# Patient Record
Sex: Female | Born: 1935 | Race: White | Hispanic: No | Smoking: Never smoker
Health system: Southern US, Community
[De-identification: ages and names within clinical notes are randomized; demographics above are authoritative.]

## PROBLEM LIST (undated history)

## (undated) DIAGNOSIS — I868 Varicose veins of other specified sites: Secondary | ICD-10-CM

## (undated) DIAGNOSIS — I252 Old myocardial infarction: Secondary | ICD-10-CM

## (undated) DIAGNOSIS — Z8542 Personal history of malignant neoplasm of other parts of uterus: Secondary | ICD-10-CM

## (undated) DIAGNOSIS — I4891 Unspecified atrial fibrillation: Secondary | ICD-10-CM

## (undated) DIAGNOSIS — Z86010 Personal history of colonic polyps: Secondary | ICD-10-CM

## (undated) DIAGNOSIS — R42 Dizziness and giddiness: Secondary | ICD-10-CM

## (undated) DIAGNOSIS — I428 Other cardiomyopathies: Secondary | ICD-10-CM

## (undated) DIAGNOSIS — I10 Essential (primary) hypertension: Secondary | ICD-10-CM

## (undated) DIAGNOSIS — Z950 Presence of cardiac pacemaker: Secondary | ICD-10-CM

## (undated) DIAGNOSIS — C439 Malignant melanoma of skin, unspecified: Secondary | ICD-10-CM

## (undated) DIAGNOSIS — E785 Hyperlipidemia, unspecified: Secondary | ICD-10-CM

## (undated) DIAGNOSIS — R55 Syncope and collapse: Secondary | ICD-10-CM

## (undated) DIAGNOSIS — J449 Chronic obstructive pulmonary disease, unspecified: Secondary | ICD-10-CM

## (undated) DIAGNOSIS — I2510 Atherosclerotic heart disease of native coronary artery without angina pectoris: Secondary | ICD-10-CM

## (undated) DIAGNOSIS — N2889 Other specified disorders of kidney and ureter: Secondary | ICD-10-CM

## (undated) DIAGNOSIS — I447 Left bundle-branch block, unspecified: Secondary | ICD-10-CM

## (undated) HISTORY — DX: Essential (primary) hypertension: I10

## (undated) HISTORY — DX: Personal history of malignant neoplasm of other parts of uterus: Z85.42

## (undated) HISTORY — DX: Unspecified atrial fibrillation: I48.91

## (undated) HISTORY — DX: Hyperlipidemia, unspecified: E78.5

## (undated) HISTORY — DX: Personal history of colonic polyps: Z86.010

## (undated) HISTORY — DX: Other cardiomyopathies: I42.8

## (undated) HISTORY — DX: Left bundle-branch block, unspecified: I44.7

## (undated) HISTORY — DX: Malignant melanoma of skin, unspecified: C43.9

## (undated) HISTORY — DX: Syncope and collapse: R55

## (undated) HISTORY — DX: Dizziness and giddiness: R42

## (undated) HISTORY — DX: Atherosclerotic heart disease of native coronary artery without angina pectoris: I25.10

## (undated) HISTORY — PX: OOPHORECTOMY: SHX86

## (undated) HISTORY — DX: Old myocardial infarction: I25.2

## (undated) HISTORY — DX: Varicose veins of other specified sites: I86.8

## (undated) HISTORY — PX: OTHER SURGICAL HISTORY: SHX169

---

## 1986-04-12 HISTORY — PX: ABDOMINAL HYSTERECTOMY: SHX81

## 1998-04-12 LAB — CONVERTED CEMR LAB

## 2002-04-12 HISTORY — PX: CORONARY ARTERY BYPASS GRAFT: SHX141

## 2002-10-26 ENCOUNTER — Encounter: Payer: Self-pay | Admitting: Internal Medicine

## 2003-07-01 ENCOUNTER — Encounter: Payer: Self-pay | Admitting: Internal Medicine

## 2004-09-03 ENCOUNTER — Encounter: Payer: Self-pay | Admitting: Internal Medicine

## 2007-03-01 ENCOUNTER — Encounter: Payer: Self-pay | Admitting: Internal Medicine

## 2007-04-13 HISTORY — PX: CORONARY ANGIOPLASTY WITH STENT PLACEMENT: SHX49

## 2007-06-15 ENCOUNTER — Encounter: Payer: Self-pay | Admitting: Internal Medicine

## 2007-06-26 ENCOUNTER — Encounter: Payer: Self-pay | Admitting: Internal Medicine

## 2007-09-22 ENCOUNTER — Emergency Department (HOSPITAL_COMMUNITY): Admission: EM | Admit: 2007-09-22 | Discharge: 2007-09-22 | Payer: Self-pay | Admitting: Emergency Medicine

## 2007-09-22 ENCOUNTER — Encounter: Payer: Self-pay | Admitting: Internal Medicine

## 2007-10-06 ENCOUNTER — Ambulatory Visit: Payer: Self-pay | Admitting: Internal Medicine

## 2007-10-06 DIAGNOSIS — I5020 Unspecified systolic (congestive) heart failure: Secondary | ICD-10-CM | POA: Insufficient documentation

## 2007-10-06 DIAGNOSIS — E785 Hyperlipidemia, unspecified: Secondary | ICD-10-CM

## 2007-10-06 DIAGNOSIS — I2510 Atherosclerotic heart disease of native coronary artery without angina pectoris: Secondary | ICD-10-CM

## 2007-10-06 DIAGNOSIS — I255 Ischemic cardiomyopathy: Secondary | ICD-10-CM

## 2007-10-06 DIAGNOSIS — I10 Essential (primary) hypertension: Secondary | ICD-10-CM

## 2007-10-06 DIAGNOSIS — I252 Old myocardial infarction: Secondary | ICD-10-CM

## 2007-10-06 DIAGNOSIS — I4891 Unspecified atrial fibrillation: Secondary | ICD-10-CM

## 2007-10-06 HISTORY — DX: Essential (primary) hypertension: I10

## 2007-10-06 HISTORY — DX: Old myocardial infarction: I25.2

## 2007-10-06 HISTORY — DX: Atherosclerotic heart disease of native coronary artery without angina pectoris: I25.10

## 2007-10-06 HISTORY — DX: Hyperlipidemia, unspecified: E78.5

## 2007-10-06 HISTORY — DX: Unspecified atrial fibrillation: I48.91

## 2007-10-11 ENCOUNTER — Encounter: Admission: RE | Admit: 2007-10-11 | Discharge: 2007-10-11 | Payer: Self-pay | Admitting: Internal Medicine

## 2007-10-18 ENCOUNTER — Encounter: Payer: Self-pay | Admitting: Pharmacist

## 2007-10-18 ENCOUNTER — Ambulatory Visit: Payer: Self-pay | Admitting: Internal Medicine

## 2007-10-18 LAB — CONVERTED CEMR LAB: INR: 1.4

## 2007-10-26 ENCOUNTER — Encounter: Payer: Self-pay | Admitting: Pharmacist

## 2007-10-26 LAB — CONVERTED CEMR LAB: INR: 2.2

## 2007-11-02 ENCOUNTER — Ambulatory Visit: Payer: Self-pay | Admitting: Cardiology

## 2007-11-21 ENCOUNTER — Encounter: Payer: Self-pay | Admitting: Pharmacist

## 2007-11-21 LAB — CONVERTED CEMR LAB: INR: 3

## 2007-11-28 ENCOUNTER — Ambulatory Visit: Payer: Self-pay | Admitting: Cardiology

## 2007-11-30 ENCOUNTER — Ambulatory Visit: Payer: Self-pay | Admitting: Cardiology

## 2007-11-30 LAB — CONVERTED CEMR LAB
Basophils Absolute: 0 10*3/uL (ref 0.0–0.1)
Basophils Relative: 0.9 % (ref 0.0–3.0)
CO2: 31 meq/L (ref 19–32)
Calcium: 8.8 mg/dL (ref 8.4–10.5)
Chloride: 105 meq/L (ref 96–112)
Eosinophils Absolute: 0.1 10*3/uL (ref 0.0–0.7)
Eosinophils Relative: 2.4 % (ref 0.0–5.0)
GFR calc non Af Amer: 75 mL/min
Hemoglobin: 13.4 g/dL (ref 12.0–15.0)
Lymphocytes Relative: 26.7 % (ref 12.0–46.0)
Monocytes Absolute: 0.4 10*3/uL (ref 0.1–1.0)
Neutrophils Relative %: 60.8 % (ref 43.0–77.0)
Potassium: 3.6 meq/L (ref 3.5–5.1)
Prothrombin Time: 27.4 s — ABNORMAL HIGH (ref 10.9–13.3)
RDW: 13.1 % (ref 11.5–14.6)
Sodium: 141 meq/L (ref 135–145)
WBC: 4.5 10*3/uL (ref 4.5–10.5)

## 2007-12-01 ENCOUNTER — Inpatient Hospital Stay (HOSPITAL_BASED_OUTPATIENT_CLINIC_OR_DEPARTMENT_OTHER): Admission: RE | Admit: 2007-12-01 | Discharge: 2007-12-01 | Payer: Self-pay | Admitting: Cardiology

## 2007-12-01 ENCOUNTER — Ambulatory Visit: Payer: Self-pay | Admitting: Cardiology

## 2007-12-01 ENCOUNTER — Ambulatory Visit: Payer: Self-pay | Admitting: Internal Medicine

## 2007-12-05 ENCOUNTER — Ambulatory Visit: Payer: Self-pay

## 2007-12-05 ENCOUNTER — Encounter: Payer: Self-pay | Admitting: Internal Medicine

## 2007-12-12 ENCOUNTER — Ambulatory Visit: Payer: Self-pay | Admitting: Cardiology

## 2007-12-12 LAB — CONVERTED CEMR LAB
BUN: 14 mg/dL (ref 6–23)
Basophils Relative: 1.1 % (ref 0.0–3.0)
Chloride: 108 meq/L (ref 96–112)
Eosinophils Absolute: 0.2 10*3/uL (ref 0.0–0.7)
Eosinophils Relative: 4.2 % (ref 0.0–5.0)
GFR calc Af Amer: 90 mL/min
Glucose, Bld: 80 mg/dL (ref 70–99)
HCT: 39.4 % (ref 36.0–46.0)
INR: 1.2 — ABNORMAL HIGH (ref 0.8–1.0)
MCHC: 34.1 g/dL (ref 30.0–36.0)
MCV: 93.3 fL (ref 78.0–100.0)
Monocytes Absolute: 0.4 10*3/uL (ref 0.1–1.0)
Prothrombin Time: 14.3 s — ABNORMAL HIGH (ref 10.9–13.3)
Sodium: 141 meq/L (ref 135–145)

## 2007-12-14 ENCOUNTER — Inpatient Hospital Stay (HOSPITAL_COMMUNITY): Admission: AD | Admit: 2007-12-14 | Discharge: 2007-12-15 | Payer: Self-pay | Admitting: Cardiology

## 2007-12-14 ENCOUNTER — Ambulatory Visit: Payer: Self-pay | Admitting: Cardiology

## 2007-12-19 ENCOUNTER — Encounter (INDEPENDENT_AMBULATORY_CARE_PROVIDER_SITE_OTHER): Payer: Self-pay | Admitting: Pharmacist

## 2007-12-28 ENCOUNTER — Encounter (HOSPITAL_COMMUNITY): Admission: RE | Admit: 2007-12-28 | Discharge: 2008-03-27 | Payer: Self-pay | Admitting: Cardiology

## 2007-12-28 ENCOUNTER — Ambulatory Visit: Payer: Self-pay | Admitting: Cardiology

## 2008-03-28 ENCOUNTER — Encounter (HOSPITAL_COMMUNITY): Admission: RE | Admit: 2008-03-28 | Discharge: 2008-04-10 | Payer: Self-pay | Admitting: Cardiology

## 2008-04-08 ENCOUNTER — Telehealth: Payer: Self-pay | Admitting: Internal Medicine

## 2008-04-29 ENCOUNTER — Ambulatory Visit: Payer: Self-pay | Admitting: Internal Medicine

## 2008-04-30 ENCOUNTER — Ambulatory Visit: Payer: Self-pay | Admitting: Cardiology

## 2008-04-30 LAB — CONVERTED CEMR LAB
Alkaline Phosphatase: 61 units/L (ref 39–117)
BUN: 13 mg/dL (ref 6–23)
Basophils Absolute: 0 10*3/uL (ref 0.0–0.1)
Basophils Relative: 0.4 % (ref 0.0–3.0)
CO2: 33 meq/L — ABNORMAL HIGH (ref 19–32)
Calcium: 9.5 mg/dL (ref 8.4–10.5)
Chloride: 102 meq/L (ref 96–112)
Creatinine, Ser: 0.8 mg/dL (ref 0.4–1.2)
Eosinophils Absolute: 0.2 10*3/uL (ref 0.0–0.7)
Eosinophils Relative: 3.3 % (ref 0.0–5.0)
GFR calc Af Amer: 90 mL/min
GFR calc non Af Amer: 75 mL/min
Glucose, Bld: 91 mg/dL (ref 70–99)
HCT: 40.6 % (ref 36.0–46.0)
Hemoglobin: 14.2 g/dL (ref 12.0–15.0)
Lymphocytes Relative: 24.9 % (ref 12.0–46.0)
Monocytes Absolute: 0.5 10*3/uL (ref 0.1–1.0)
Monocytes Relative: 8.7 % (ref 3.0–12.0)
Neutro Abs: 3.2 10*3/uL (ref 1.4–7.7)
RDW: 13.5 % (ref 11.5–14.6)
Sodium: 141 meq/L (ref 135–145)
TSH: 1.96 microintl units/mL (ref 0.35–5.50)
Total Bilirubin: 1 mg/dL (ref 0.3–1.2)
Total CHOL/HDL Ratio: 2.8
VLDL: 21 mg/dL (ref 0–40)
WBC: 5.2 10*3/uL (ref 4.5–10.5)

## 2008-08-28 ENCOUNTER — Encounter (INDEPENDENT_AMBULATORY_CARE_PROVIDER_SITE_OTHER): Payer: Self-pay | Admitting: *Deleted

## 2008-08-30 ENCOUNTER — Inpatient Hospital Stay (HOSPITAL_COMMUNITY): Admission: EM | Admit: 2008-08-30 | Discharge: 2008-08-31 | Payer: Self-pay | Admitting: Emergency Medicine

## 2008-08-30 ENCOUNTER — Ambulatory Visit: Payer: Self-pay | Admitting: Cardiovascular Disease

## 2008-08-30 HISTORY — PX: CARDIAC CATHETERIZATION: SHX172

## 2008-09-06 ENCOUNTER — Ambulatory Visit: Payer: Self-pay | Admitting: Internal Medicine

## 2008-09-06 DIAGNOSIS — Z8542 Personal history of malignant neoplasm of other parts of uterus: Secondary | ICD-10-CM

## 2008-09-06 DIAGNOSIS — Z85820 Personal history of malignant melanoma of skin: Secondary | ICD-10-CM

## 2008-09-06 DIAGNOSIS — I447 Left bundle-branch block, unspecified: Secondary | ICD-10-CM

## 2008-09-06 DIAGNOSIS — C439 Malignant melanoma of skin, unspecified: Secondary | ICD-10-CM

## 2008-09-06 HISTORY — DX: Left bundle-branch block, unspecified: I44.7

## 2008-09-06 HISTORY — DX: Personal history of malignant neoplasm of other parts of uterus: Z85.42

## 2008-09-06 HISTORY — DX: Malignant melanoma of skin, unspecified: C43.9

## 2008-09-18 ENCOUNTER — Ambulatory Visit: Payer: Self-pay

## 2008-09-18 ENCOUNTER — Encounter: Payer: Self-pay | Admitting: Cardiology

## 2008-09-18 ENCOUNTER — Ambulatory Visit: Payer: Self-pay | Admitting: Internal Medicine

## 2008-10-22 ENCOUNTER — Ambulatory Visit: Payer: Self-pay | Admitting: Internal Medicine

## 2008-11-13 ENCOUNTER — Telehealth: Payer: Self-pay | Admitting: Cardiology

## 2008-11-21 ENCOUNTER — Encounter (INDEPENDENT_AMBULATORY_CARE_PROVIDER_SITE_OTHER): Payer: Self-pay | Admitting: *Deleted

## 2008-12-09 ENCOUNTER — Telehealth: Payer: Self-pay | Admitting: Cardiology

## 2008-12-17 ENCOUNTER — Telehealth: Payer: Self-pay | Admitting: Family Medicine

## 2008-12-20 ENCOUNTER — Telehealth: Payer: Self-pay | Admitting: Internal Medicine

## 2008-12-26 ENCOUNTER — Ambulatory Visit: Payer: Self-pay | Admitting: Internal Medicine

## 2008-12-30 LAB — CONVERTED CEMR LAB
AST: 26 units/L (ref 0–37)
Albumin: 4 g/dL (ref 3.5–5.2)
Alkaline Phosphatase: 56 units/L (ref 39–117)
Bilirubin, Direct: 0.1 mg/dL (ref 0.0–0.3)
Cholesterol: 138 mg/dL (ref 0–200)
Creatinine, Ser: 0.8 mg/dL (ref 0.4–1.2)
HDL: 53.6 mg/dL (ref >39.00)
LDL Cholesterol: 74 mg/dL (ref 0–99)
Total CHOL/HDL Ratio: 3
Triglycerides: 52 mg/dL (ref 0.0–149.0)

## 2009-01-07 ENCOUNTER — Ambulatory Visit: Payer: Self-pay | Admitting: Cardiology

## 2009-02-15 ENCOUNTER — Telehealth (INDEPENDENT_AMBULATORY_CARE_PROVIDER_SITE_OTHER): Payer: Self-pay | Admitting: *Deleted

## 2009-04-24 ENCOUNTER — Ambulatory Visit: Payer: Self-pay | Admitting: Internal Medicine

## 2009-04-24 LAB — CONVERTED CEMR LAB
ALT: 19 units/L (ref 0–35)
AST: 24 units/L (ref 0–37)
Alkaline Phosphatase: 56 units/L (ref 39–117)
BUN: 6 mg/dL (ref 6–23)
Chloride: 97 meq/L (ref 96–112)
Cholesterol: 154 mg/dL (ref 0–200)
Creatinine, Ser: 0.9 mg/dL (ref 0.4–1.2)
GFR calc non Af Amer: 64.79 mL/min (ref >60)
Potassium: 4.6 meq/L (ref 3.5–5.1)
Sodium: 133 meq/L — ABNORMAL LOW (ref 135–145)
Total Protein: 6 g/dL (ref 6.0–8.3)
Triglycerides: 56 mg/dL (ref 0.0–149.0)
VLDL: 11.2 mg/dL (ref 0.0–40.0)

## 2009-04-25 ENCOUNTER — Ambulatory Visit: Payer: Self-pay | Admitting: Internal Medicine

## 2009-05-19 ENCOUNTER — Telehealth: Payer: Self-pay | Admitting: Internal Medicine

## 2009-06-30 ENCOUNTER — Telehealth: Payer: Self-pay | Admitting: Cardiology

## 2009-09-29 ENCOUNTER — Encounter (INDEPENDENT_AMBULATORY_CARE_PROVIDER_SITE_OTHER): Payer: Self-pay | Admitting: *Deleted

## 2009-09-29 ENCOUNTER — Ambulatory Visit: Payer: Self-pay | Admitting: Internal Medicine

## 2009-09-29 DIAGNOSIS — I868 Varicose veins of other specified sites: Secondary | ICD-10-CM

## 2009-09-29 DIAGNOSIS — I8390 Asymptomatic varicose veins of unspecified lower extremity: Secondary | ICD-10-CM

## 2009-09-29 HISTORY — DX: Varicose veins of other specified sites: I86.8

## 2009-10-02 ENCOUNTER — Encounter: Admission: RE | Admit: 2009-10-02 | Discharge: 2009-10-02 | Payer: Self-pay | Admitting: Internal Medicine

## 2009-10-02 LAB — HM MAMMOGRAPHY: HM Mammogram: NEGATIVE

## 2009-10-17 ENCOUNTER — Ambulatory Visit: Payer: Self-pay | Admitting: Internal Medicine

## 2009-10-17 LAB — CONVERTED CEMR LAB
Alkaline Phosphatase: 61 units/L (ref 39–117)
BUN: 14 mg/dL (ref 6–23)
Bilirubin, Direct: 0.1 mg/dL (ref 0.0–0.3)
Calcium: 9 mg/dL (ref 8.4–10.5)
Chloride: 107 meq/L (ref 96–112)
Cholesterol: 151 mg/dL (ref 0–200)
GFR calc non Af Amer: 71.04 mL/min (ref >60)
Glucose, Bld: 104 mg/dL — ABNORMAL HIGH (ref 70–99)
LDL Cholesterol: 80 mg/dL (ref 0–99)
Potassium: 4.8 meq/L (ref 3.5–5.1)
Total Bilirubin: 0.7 mg/dL (ref 0.3–1.2)

## 2009-10-27 ENCOUNTER — Telehealth: Payer: Self-pay | Admitting: Cardiology

## 2009-10-30 ENCOUNTER — Encounter (INDEPENDENT_AMBULATORY_CARE_PROVIDER_SITE_OTHER): Payer: Self-pay | Admitting: *Deleted

## 2009-10-30 ENCOUNTER — Ambulatory Visit: Payer: Self-pay | Admitting: Gastroenterology

## 2009-10-30 DIAGNOSIS — Z860100 Personal history of colon polyps, unspecified: Secondary | ICD-10-CM

## 2009-10-30 HISTORY — DX: Personal history of colon polyps, unspecified: Z86.0100

## 2009-10-30 HISTORY — DX: Personal history of colonic polyps: Z86.010

## 2009-11-03 ENCOUNTER — Encounter (INDEPENDENT_AMBULATORY_CARE_PROVIDER_SITE_OTHER): Payer: Self-pay | Admitting: *Deleted

## 2009-11-12 ENCOUNTER — Ambulatory Visit: Payer: Self-pay | Admitting: Internal Medicine

## 2009-11-24 ENCOUNTER — Ambulatory Visit: Payer: Self-pay | Admitting: Vascular Surgery

## 2009-11-24 ENCOUNTER — Encounter: Payer: Self-pay | Admitting: Internal Medicine

## 2009-12-10 ENCOUNTER — Ambulatory Visit: Payer: Self-pay | Admitting: Gastroenterology

## 2009-12-12 ENCOUNTER — Encounter: Payer: Self-pay | Admitting: Cardiology

## 2009-12-12 ENCOUNTER — Ambulatory Visit: Payer: Self-pay | Admitting: Cardiology

## 2010-01-26 ENCOUNTER — Ambulatory Visit: Payer: Self-pay | Admitting: Family Medicine

## 2010-01-26 DIAGNOSIS — T148XXA Other injury of unspecified body region, initial encounter: Secondary | ICD-10-CM | POA: Insufficient documentation

## 2010-05-09 ENCOUNTER — Encounter: Payer: Self-pay | Admitting: Internal Medicine

## 2010-05-12 NOTE — Assessment & Plan Note (Signed)
Summary: [facility]  Medications Added RAMIPRIL 5 MG  CAPS (RAMIPRIL) 1 tablet by mouth daily METOPROLOL TARTRATE 50 MG TABS (METOPROLOL TARTRATE) 1/2 by mouth once daily NITROSTAT 0.4 MG SUBL (NITROGLYCERIN) 1 tablet under tongue at onset of chest pain; you may repeat every 5 minutes for up to 3 doses. NITROLINGUAL 0.4 MG/SPRAY SOLN (NITROGLYCERIN) One spray under tongue every 5 minutes as needed for chest pain---may repeat times three        Visit Type:  1 yr f/u  CC:  no cardiac complaints today.  Current Medications (verified): 1)  Ramipril 5 Mg  Caps (Ramipril) .Marland Kitchen.. 1 Tablet By Mouth Daily 2)  Metoprolol Tartrate 50 Mg Tabs (Metoprolol Tartrate) .... 1/2 By Mouth Once Daily 3)  Simvastatin 80 Mg Tabs (Simvastatin) .Marland Kitchen.. 1 By Mouth At Bedtime 4)  Plavix 75 Mg Tabs (Clopidogrel Bisulfate) .... Take 1 Tablet By Mouth Once A Day 5)  Fish Oil 1000 Mg Caps (Omega-3 Fatty Acids) .... Once Daily 6)  Centrum  Chew (Multiple Vitamins-Minerals) .... Once Daily 7)  Nitrostat 0.4 Mg Subl (Nitroglycerin) .Marland Kitchen.. 1 Tablet Under Tongue At Onset of Chest Pain; You May Repeat Every 5 Minutes For Up To 3 Doses. 8)  Anucort-Hc 25 Mg Supp (Hydrocortisone Acetate) .... Insert One Suppository Rectally Every Day For Hemorrhoids As Needed 9)  Cvs Sleep Aid 50 Mg Caps (Diphenhydramine Hcl (Sleep)) .... Take 2 Tablet By Mouth At Bedtime 10)  Aspirin 325 Mg Tabs (Aspirin) .... One Tablet  3 Times A Week  Allergies: 1)  ! Nitroglycerin  Vital Signs:  Patient profile:   77 year old female Height:      67.5 inches Weight:      136.12 pounds BMI:     21.08 Pulse rate:   72 / minute Pulse rhythm:   irregular BP sitting:   98 / 60  (left arm) Cuff size:   large  Vitals Entered By: Danielle Rankin, CMA (December 12, 2009 2:27 PM)  Prescriptions: NITROLINGUAL 0.4 MG/SPRAY SOLN (NITROGLYCERIN) One spray under tongue every 5 minutes as needed for chest pain---may repeat times three  #1 x 6   Entered by:    Danielle Rankin, CMA   Authorized by:   Gaylord Shih, MD, [facility]   Signed by:   Danielle Rankin, CMA on 12/12/2009   Method used:   Electronically to        [facility]. [address] [phone]* (retail)       [address]       [place], [place]  [address]       Ph: [phone] or [phone]       Fax: [phone]   RxID:   [id number] METOPROLOL TARTRATE 50 MG TABS (METOPROLOL TARTRATE) 1/2 by mouth once daily  #30 x 11   Entered by:   Danielle Rankin, CMA   Authorized by:   Gaylord Shih, MD, [facility]   Signed by:   Danielle Rankin, CMA on 12/12/2009   Method used:   Electronically to        [facility]. [address] [phone]* (retail)       [address]       [place], [place]  [address]       Ph: [phone] or [phone]       Fax: [phone]   RxID:   [id number] RAMIPRIL 5 MG  CAPS (RAMIPRIL)  1 tablet by mouth daily  #30 x 11   Entered by:   Danielle Rankin, CMA   Authorized by:   Gaylord Shih, MD, [facility]   Signed by:   Danielle Rankin, CMA on 12/12/2009   Method used:   Electronically to        [facility]. [address] [phone]* (retail)       [address]       [place], [place]  [address]       Ph: [phone] or [phone]       Fax: [phone]   RxID:   [id number] NITROSTAT 0.4 MG SUBL (NITROGLYCERIN) 1 tablet under tongue at onset of chest pain; you may repeat every 5 minutes for up to 3 doses.  #25 x 6   Entered by:   Danielle Rankin, CMA   Authorized by:   Gaylord Shih, MD, [facility]   Signed by:   Danielle Rankin, CMA on 12/12/2009   Method used:   Electronically to        [facility]. [address] [phone]* (retail)       [address]       [place], [place]  [address]       Ph: [phone] or [phone]       Fax: [phone]   RxID:   [id number]

## 2010-05-12 NOTE — Assessment & Plan Note (Signed)
Summary: EAR CONGESTION / NEED FOR EAR IRRIGATION // RS   Vital Signs:  Patient profile:   77 year old female Height:      67.5 inches Weight:      140 pounds BMI:     21.68 Pulse rate:   92 / minute Pulse rhythm:   regular Resp:     12 per minute BP sitting:   114 / 66  (left arm) Cuff size:   regular  Vitals Entered By: Gladis Riffle, RN (September 29, 2009 11:48 AM) CC: requests check up--ears done in emergency clinic prior Is Patient Diabetic? No   CC:  requests check up--ears done in emergency clinic prior.  Preventive Screening-Counseling & Management  Alcohol-Tobacco     Smoking Status: never  Current Medications (verified): 1)  Ramipril 5 Mg  Caps (Ramipril) .Marland Kitchen.. 1 Tablet By Mouth Daily 2)  Metoprolol Tartrate 50 Mg Tabs (Metoprolol Tartrate) .... 1/2 By Mouth Once Daily 3)  Simvastatin 80 Mg Tabs (Simvastatin) .Marland Kitchen.. 1 By Mouth At Bedtime 4)  Plavix 75 Mg Tabs (Clopidogrel Bisulfate) .... Take 1 Tablet By Mouth Once A Day 5)  Fish Oil 1000 Mg Caps (Omega-3 Fatty Acids) .... Once Daily 6)  Centrum  Chew (Multiple Vitamins-Minerals) .... Once Daily 7)  Nitrolingual 0.4 Mg/spray Soln (Nitroglycerin) .... Use As Directed As Needed 8)  Anucort-Hc 25 Mg Supp (Hydrocortisone Acetate) .... Insert One Suppository Rectally Every Day For Hemorrhoids As Needed 9)  Cvs Sleep Aid 50 Mg Caps (Diphenhydramine Hcl (Sleep)) .... Take 2 Tablet By Mouth At Bedtime  Allergies: 1)  ! Nitroglycerin   Impression & Recommendations:  Problem # 1:  CORONARY ARTERY BYPASS GRAFT, HX OF (ICD-V45.81) no sxs resume ecotrin The following medications were removed from the medication list:    Ecotrin 325 Mg Tbec (Aspirin) ..... Once daily Her updated medication list for this problem includes:    Ramipril 5 Mg Caps (Ramipril) .Marland Kitchen... 1 tablet by mouth daily    Metoprolol Tartrate 50 Mg Tabs (Metoprolol tartrate) .Marland Kitchen... 1/2 by mouth once daily    Plavix 75 Mg Tabs (Clopidogrel bisulfate) .Marland Kitchen... Take 1  tablet by mouth once a day    Nitrolingual 0.4 Mg/spray Soln (Nitroglycerin) ..... Use as directed as needed  Problem # 2:  HYPERTENSION (ICD-401.9) controlled continue current medications  Her updated medication list for this problem includes:    Ramipril 5 Mg Caps (Ramipril) .Marland Kitchen... 1 tablet by mouth daily    Metoprolol Tartrate 50 Mg Tabs (Metoprolol tartrate) .Marland Kitchen... 1/2 by mouth once daily  BP today: 114/66 Prior BP: 138/72 (04/25/2009)  Labs Reviewed: K+: 4.6 (04/24/2009) Creat: : 0.9 (04/24/2009)   Chol: 154 (04/24/2009)   HDL: 61.00 (04/24/2009)   LDL: 82 (04/24/2009)   TG: 56.0 (04/24/2009)  Problem # 3:  VARICOSE VEIN (ICD-456.8) large varicose veins, bilaterally refer fascular clinic  total time 20 minutes all FTF Orders: Vascular Clinic (Vascular)  Complete Medication List: 1)  Ramipril 5 Mg Caps (Ramipril) .Marland Kitchen.. 1 tablet by mouth daily 2)  Metoprolol Tartrate 50 Mg Tabs (Metoprolol tartrate) .... 1/2 by mouth once daily 3)  Simvastatin 80 Mg Tabs (Simvastatin) .Marland Kitchen.. 1 by mouth at bedtime 4)  Plavix 75 Mg Tabs (Clopidogrel bisulfate) .... Take 1 tablet by mouth once a day 5)  Fish Oil 1000 Mg Caps (Omega-3 fatty acids) .... Once daily 6)  Centrum Chew (Multiple vitamins-minerals) .... Once daily 7)  Nitrolingual 0.4 Mg/spray Soln (Nitroglycerin) .... Use as directed as needed 8)  Anucort-hc  25 Mg Supp (Hydrocortisone acetate) .... Insert one suppository rectally every day for hemorrhoids as needed 9)  Cvs Sleep Aid 50 Mg Caps (Diphenhydramine hcl (sleep)) .... Take 2 tablet by mouth at bedtime  Other Orders: Gastroenterology Referral (GI)      Appended Document: EAR CONGESTION / NEED FOR EAR IRRIGATION // RS FTF time is all counseling regarding varicose veins veins are bilateral L>R (lower extremities)

## 2010-05-12 NOTE — Assessment & Plan Note (Signed)
Summary: [name] [name] F/U/CY    Referring Provider:  Birdie Sons [name] Primary Provider:  Birdie Sons [name]   History of Present Illness: [name] [name] comes in today for evaluation and management of coronary disease.  She's having no angina or ischemic symptoms. She is very anxious to get into an exercise program and start some low-level weight lifting. She likes or playing golf again. She is trying to get out more more since her husband died. She is very emotional today again.  She is very compliant with her medications. Her lipids are under excellent control with Dr. Cato [name].Recent blood work was reviewed with her today.  Clinical Reports Reviewed:  Cardiac Cath:  08/30/2008: Cardiac Cath Findings:   RESULTS:  Hemodynamics:  LV 129/7, AO 129/57.   Coronaries:  Left main was normal.  The LAD had a proximal stent, which   was widely patent with mild in-stent luminal irregularities.  A first   diagonal was seen to be occluded at the ostium.  It filled via vein   graft.  There was a 30% stenosis immediately after the insertion of the   vein graft.  Circumflex in the AV groove had proximal 40% stenosis after   an obtuse marginal #1.  The obtuse marginal #1 was a small vessel and   normal.  OM2 was large, occluded at the ostium and filled via the vein   grafts.  Right coronary artery is a dominant vessel.  It was occluded in   the mid segment.  Grafts, LIMA to the LAD was known to be atretic and I   did not engage this vessel.  Saphenous vein graft to the right coronary   artery is widely patent.  Saphenous vein graft to diagonal was widely   patent, again 30% stenosis after the insertion of the vein graft.   Saphenous vein graft to the obtuse marginal was widely patent.  Left   ventriculogram, the left ventriculogram was obtained in the RAO   projection.  The EF was 60% with normal Kenya Shiraishi motion.  Aortogram, the   distal aortogram was obtained.  It did demonstrate a dissection plane   below the  aortoiliac bifurcation.  There was brisk flow in the true   lumen.      PLAN:  The patient will be observed overnight.  She will continue   medical management for her known coronary disease.  I did review the   results including the complication with the patient and the family.      12/14/2007: Cardiac Cath Findings:   RESULTS:  Initially, the stenosis in the proximal mid LAD was 80% and   was long and calcified.  Following stenting, this improved to 0%.      CONCLUSION:  Successful PCI of the lesion in the proximal mid-LAD using   rotational atherectomy, IVUS guidance, and 2 overlapping Promus drug-   eluting stents with improvement in percent of narrowing from 80% to 0%.      DISPOSITION:  The patient returned to [facility] room for further   observation.               Bruce Elvera Lennox Juanda Chance, [name], [facility]   Electronically Signed  Cardiac Cath Findings:  CONCLUSION:  Successful PCI of the lesion in the proximal mid-LAD using   rotational atherectomy, IVUS guidance, and 2 overlapping Promus drug-   eluting stents with improvement in percent of narrowing from 80% to 0%.      DISPOSITION:  The patient returned to  postangio room for further   observation.               Bruce Elvera Lennox Juanda Chance, [name], [facility]   Electronically Signed            BRB/MEDQ  D:  12/14/2007  T:  12/15/2007  Job:  [id number]      cc:   [name] [name] C. Sister Carbone, [name], FACC   Bruce H. Swords, [name]   Cardiopulmonary Laboratory   Nuclear Study:  12/05/2007:  Low risk adenosine nuclear study with apical thinning vs small prior infarct; cannot R/O very mild apical ischemia   Allergies: 1)  ! Nitroglycerin   EKG  Procedure date:  12/12/2009  Findings:      normal sinus rhythm, left bundle branch block, no change.  Impression & Recommendations:  Problem # 1:  LBBB (ICD-426.3) Assessment Unchanged  Her updated medication list for this problem includes:    Ramipril 5 Mg Caps (Ramipril) .[name] [name]... 1 tablet by mouth daily    Metoprolol  Tartrate 50 Mg Tabs (Metoprolol tartrate) .[name] [name]... 1/2 by mouth once daily    Plavix 75 Mg Tabs (Clopidogrel bisulfate) .[name] [name]... Take 1 tablet by mouth once a day    Nitrostat 0.4 Mg Subl (Nitroglycerin) .[name] [name]... 1 tablet under tongue at onset of chest pain; you may repeat every 5 minutes for up to 3 doses.    Aspirin 325 Mg Tabs (Aspirin) ..... One tablet  3 times a week  Problem # 2:  CORONARY ARTERY DISEASE (ICD-414.00) Assessment: Unchanged  Her updated medication list for this problem includes:    Ramipril 5 Mg Caps (Ramipril) .[name] [name]... 1 tablet by mouth daily    Metoprolol Tartrate 50 Mg Tabs (Metoprolol tartrate) .[name] [name]... 1/2 by mouth once daily    Plavix 75 Mg Tabs (Clopidogrel bisulfate) .[name] [name]... Take 1 tablet by mouth once a day    Nitrostat 0.4 Mg Subl (Nitroglycerin) .[name] [name]... 1 tablet under tongue at onset of chest pain; you may repeat every 5 minutes for up to 3 doses.    Aspirin 325 Mg Tabs (Aspirin) ..... One tablet  3 times a week  Orders: EKG w/ Interpretation ([address])  Problem # 3:  MYOCARDIAL INFARCTION, HX OF (ICD-412) Assessment: Unchanged  Her updated medication list for this problem includes:    Ramipril 5 Mg Caps (Ramipril) .[name] [name]... 1 tablet by mouth daily    Metoprolol Tartrate 50 Mg Tabs (Metoprolol tartrate) .[name] [name]... 1/2 by mouth once daily    Plavix 75 Mg Tabs (Clopidogrel bisulfate) .[name] [name]... Take 1 tablet by mouth once a day    Nitrostat 0.4 Mg Subl (Nitroglycerin) .[name] [name]... 1 tablet under tongue at onset of chest pain; you may repeat every 5 minutes for up to 3 doses.    Aspirin 325 Mg Tabs (Aspirin) ..... One tablet  3 times a week  Orders: EKG w/ Interpretation ([address])  Problem # 4:  HYPERTENSION (ICD-401.9) Assessment: Improved  Her updated medication list for this problem includes:    Ramipril 5 Mg Caps (Ramipril) .[name] [name]... 1 tablet by mouth daily    Metoprolol Tartrate 50 Mg Tabs (Metoprolol tartrate) .[name] [name]... 1/2 by mouth once daily    Aspirin 325 Mg Tabs (Aspirin) ..... One tablet  3  times a week  Problem # 5:  HYPERLIPIDEMIA (ICD-272.4)  Her updated medication list for this problem includes:    Simvastatin 80 Mg Tabs (Simvastatin) .[name] [name]... 1 by mouth at bedtime  Problem # 6:  ATRIAL FIBRILLATION (ICD-427.31)  Her updated medication list for this problem includes:  Metoprolol Tartrate 50 Mg Tabs (Metoprolol tartrate) .[name] [name]... 1/2 by mouth once daily    Plavix 75 Mg Tabs (Clopidogrel bisulfate) .[name] [name]... Take 1 tablet by mouth once a day    Aspirin 325 Mg Tabs (Aspirin) ..... One tablet  3 times a week  Patient Instructions: 1)  Your physician recommends that you schedule a follow-up appointment in: 1 year with Dr. Daleen Squibb 2)  Your physician recommends that you continue on your current medications as directed. Please refer to the Current Medication list given to you today. 3)  Your physician discussed the importance of regular exercise and recommended that you start or continue a regular exercise program for good health. Continue doing your isotonic lifting  exercises at the gym and your cardio workout 3 hours per week.

## 2010-05-12 NOTE — Assessment & Plan Note (Signed)
Summary: 6 month rov/njr   Vital Signs:  Patient profile:   77 year old female Weight:      138 pounds Temp:     98.7 degrees F oral Pulse rate:   72 / minute Pulse rhythm:   regular Resp:     12 per minute BP sitting:   112 / 60  (left arm) Cuff size:   regular  Vitals Entered By: Gladis Riffle, RN (November 12, 2009 10:20 AM) CC: 6 week rov, has cut back on  ASA daily due to bruising Is Patient Diabetic? No   Primary Care Provider:  Birdie Sons MD  CC:  6 week rov and has cut back on  ASA daily due to bruising.  History of Present Illness: CPX  Preventive Screening-Counseling & Management  Alcohol-Tobacco     Smoking Status: never  Current Problems (verified): 1)  Personal Hx Colonic Polyps  (ICD-V12.72) 2)  Varicose Vein  (ICD-456.8) 3)  Preventive Health Care  (ICD-V70.0) 4)  Melanoma  (ICD-172.9) 5)  Uterine Cancer, Hx of  (ICD-V10.42) 6)  Lbbb  (ICD-426.3) 7)  Myocardial Infarction, Hx of  (ICD-412) 8)  Hypertension  (ICD-401.9) 9)  Hyperlipidemia  (ICD-272.4) 10)  Coronary Artery Disease  (ICD-414.00) 11)  Atrial Fibrillation  (ICD-427.31)  Current Medications (verified): 1)  Ramipril 5 Mg  Caps (Ramipril) .Marland Kitchen.. 1 Tablet By Mouth Daily 2)  Metoprolol Tartrate 50 Mg Tabs (Metoprolol Tartrate) .... 1/2 By Mouth Once Daily 3)  Simvastatin 80 Mg Tabs (Simvastatin) .Marland Kitchen.. 1 By Mouth At Bedtime 4)  Plavix 75 Mg Tabs (Clopidogrel Bisulfate) .... Take 1 Tablet By Mouth Once A Day 5)  Fish Oil 1000 Mg Caps (Omega-3 Fatty Acids) .... Once Daily 6)  Centrum  Chew (Multiple Vitamins-Minerals) .... Once Daily 7)  Nitrolingual 0.4 Mg/spray Soln (Nitroglycerin) .... Use As Directed As Needed 8)  Anucort-Hc 25 Mg Supp (Hydrocortisone Acetate) .... Insert One Suppository Rectally Every Day For Hemorrhoids As Needed 9)  Cvs Sleep Aid 50 Mg Caps (Diphenhydramine Hcl (Sleep)) .... Take 2 Tablet By Mouth At Bedtime 10)  Aspirin 325 Mg Tabs (Aspirin) .... One Tablet  3 Times A  Week 11)  Moviprep 100 Gm  Solr (Peg-Kcl-Nacl-Nasulf-Na Asc-C) .... As Per Prep Instructions.  Allergies: 1)  ! Nitroglycerin  Past History:  Past Medical History: Last updated: 09/06/2008 MELANOMA (ICD-172.9) UTERINE CANCER, HX OF (ICD-V10.42) LBBB (ICD-426.3) CORONARY ARTERY BYPASS GRAFT, HX OF (ICD-V45.81) ANGINA, UNSTABLE (ICD-411.1) VERTIGO (ICD-780.4) SYNCOPE (ICD-780.2) MYOCARDIAL INFARCTION, HX OF (ICD-412) HYPERTENSION (ICD-401.9) HYPERLIPIDEMIA (ICD-272.4) CORONARY ARTERY DISEASE (ICD-414.00) ATRIAL FIBRILLATION (ICD-427.31)  Past Surgical History: Last updated: 04/29/2008 Coronary artery bypass graft--2004 Hysterectomy--uterine Cancer-1988 Oophorectomy melanoma---right pretibeal (distal) PTCA/stent  2009  Family History: Last updated: 10/30/2009 father 19 MI mother 68- stroke No FH of Colon Cancer:  Social History: Last updated: 12/26/2008 Married-widow 2010 (husband with [facility]) moved to [place]---family  Never Smoked Alcohol use-yes Regular exercise-yes--not for a few months  Risk Factors: Exercise: yes (10/06/2007)  Risk Factors: Smoking Status: never (11/12/2009)  Physical Exam  General:  alert and well-developed.   Head:  normocephalic and atraumatic.   Eyes:  pupils equal and pupils round.   Ears:  R ear normal and L ear normal.   Neck:  No deformities, masses, or tenderness noted. Chest Wall:  No deformities, masses, or tenderness noted. Lungs:  normal respiratory effort and no intercostal retractions.   Heart:  normal rate and regular rhythm.   Abdomen:  Bowel sounds positive,abdomen soft and non-tender  without masses, organomegaly or hernias noted. Msk:  No deformity or scoliosis noted of thoracic or lumbar spine.   Neurologic:  cranial nerves II-XII intact and gait normal.   Skin:  turgor normal and color normal.   Psych:  normally interactive and good eye contact.     Impression & Recommendations:  Problem # 1:  MELANOMA  (ICD-172.9) has regular f/u with dermatology  Problem # 2:  UTERINE CANCER, HX OF (ICD-V10.42) no known recurrence  Problem # 3:  PREVENTIVE HEALTH CARE (ICD-V70.0) health maint utd advised regular exercise  Problem # 4:  CORONARY ARTERY DISEASE (ICD-414.00) no sxs continue current medications  Her updated medication list for this problem includes:    Ramipril 5 Mg Caps (Ramipril) .Marland Kitchen... 1 tablet by mouth daily    Metoprolol Tartrate 50 Mg Tabs (Metoprolol tartrate) .Marland Kitchen... 1/2 by mouth once daily    Plavix 75 Mg Tabs (Clopidogrel bisulfate) .Marland Kitchen... Take 1 tablet by mouth once a day    Nitrolingual 0.4 Mg/spray Soln (Nitroglycerin) ..... Use as directed as needed    Aspirin 325 Mg Tabs (Aspirin) ..... One tablet  3 times a week  Labs Reviewed: Chol: 151 (10/17/2009)   HDL: 56.70 (10/17/2009)   LDL: 80 (10/17/2009)   TG: 73.0 (10/17/2009)  Complete Medication List: 1)  Ramipril 5 Mg Caps (Ramipril) .Marland Kitchen.. 1 tablet by mouth daily 2)  Metoprolol Tartrate 50 Mg Tabs (Metoprolol tartrate) .... 1/2 by mouth once daily 3)  Simvastatin 80 Mg Tabs (Simvastatin) .Marland Kitchen.. 1 by mouth at bedtime 4)  Plavix 75 Mg Tabs (Clopidogrel bisulfate) .... Take 1 tablet by mouth once a day 5)  Fish Oil 1000 Mg Caps (Omega-3 fatty acids) .... Once daily 6)  Centrum Chew (Multiple vitamins-minerals) .... Once daily 7)  Nitrolingual 0.4 Mg/spray Soln (Nitroglycerin) .... Use as directed as needed 8)  Anucort-hc 25 Mg Supp (Hydrocortisone acetate) .... Insert one suppository rectally every day for hemorrhoids as needed 9)  Cvs Sleep Aid 50 Mg Caps (Diphenhydramine hcl (sleep)) .... Take 2 tablet by mouth at bedtime 10)  Aspirin 325 Mg Tabs (Aspirin) .... One tablet  3 times a week 11)  Moviprep 100 Gm Solr (Peg-kcl-nacl-nasulf-na asc-c) .... As per prep instructions.  Patient Instructions: 1)  Please schedule a follow-up appointment in 6 months. 2)  lipids 272.4 3)  liver 995.2  4)  bmet--995.2

## 2010-05-12 NOTE — Progress Notes (Signed)
Summary: 2 wk supply simvastatin  Phone Note Call from Patient Call back at Home Phone [phone]   Caller: Patient Summary of Call: 2 wk supply simvastatin 80 mg until mailorder arrives [facility] [phone] Initial call taken by: Heron Sabins,  May 19, 2009 10:57 AM    Prescriptions: SIMVASTATIN 80 MG TABS (SIMVASTATIN) 1 by mouth at bedtime  #15 x 0   Entered by:   Gladis Riffle, RN   Authorized by:   Birdie Sons MD   Signed by:   Gladis Riffle, RN on 05/19/2009   Method used:   Electronically to        [facility] [address] [phone]* (retail)       [address]       [place], [place]  [address]       Ph: [phone] or [phone]       Fax: [phone]   RxID:   [id number]

## 2010-05-12 NOTE — Progress Notes (Signed)
Summary: antiboitoic h/o stent   Phone Note Call from Patient Call back at Home Phone [phone]   Caller: Patient Reason for Call: Talk to Nurse Summary of Call: Pt having skin surgery tomorrow, does pt need antibiotic . h/o stent.  Initial call taken by: Lorne Skeens,  June 30, 2009 9:02 AM  Follow-up for Phone Call        I spoke with the pt and explained she does not need an antibiotic for her skin surgery due to the stents in her heart. The pt verbalizes understanding. Follow-up by: Sherri Rad, RN, BSN,  June 30, 2009 9:22 AM

## 2010-05-12 NOTE — Assessment & Plan Note (Signed)
Summary: discuss colon on BT--ch.    History of Present Illness Visit Type: consult  Primary GI MD: Sheryn Bison MD FACP FAGA Primary Provider: Birdie Sons MD Requesting Provider: Birdie Sons MD Chief Complaint: Consult colon. Pt  c/o bloating and hemorrhoids  History of Present Illness:   Extremely pleasant 77 year old Caucasian female with multiple medical problems including previous cardiac bypass surgery for coronary artery disease and cardiac stent placing 2 years ago. She is chronically on Plavix and aspirin. She also has a history of previous hysterectomy and oopherectomy uterine carcinoma, local excision of a melanoma, and suffers from recurrent atrial fibrillation which is currently under good control metaprolol.  She denies a current GI complaints except for periodic hemorrhoidal discomfort managed with p.r.n. Anusol-HC suppositories. She has had colonoscopy twice with removal of colon polyps, last done 5 years ago apparently in [place]. She has regular bowel movements without melena or hematochezia, and denies upper gastrointestinal or hepatobiliary problems. She does bruise easily but otherwise denies coagulopathy. Apparently, she does not take her aspirin regularly. She is not on sedatives or psychotropic medications. She denies any previous problems with conscious sedation. Family history is noncontributory but vague.   GI Review of Systems    Reports bloating.      Denies abdominal pain, acid reflux, belching, chest pain, dysphagia with liquids, dysphagia with solids, heartburn, loss of appetite, nausea, vomiting, vomiting blood, weight loss, and  weight gain.      Reports hemorrhoids.     Denies anal fissure, black tarry stools, change in bowel habit, constipation, diarrhea, diverticulosis, fecal incontinence, heme positive stool, irritable bowel syndrome, jaundice, light color stool, liver problems, rectal bleeding, and  rectal pain.    Current Medications  (verified): 1)  Ramipril 5 Mg  Caps (Ramipril) .Marland Kitchen.. 1 Tablet By Mouth Daily 2)  Metoprolol Tartrate 50 Mg Tabs (Metoprolol Tartrate) .... 1/2 By Mouth Once Daily 3)  Simvastatin 80 Mg Tabs (Simvastatin) .Marland Kitchen.. 1 By Mouth At Bedtime 4)  Plavix 75 Mg Tabs (Clopidogrel Bisulfate) .... Take 1 Tablet By Mouth Once A Day 5)  Fish Oil 1000 Mg Caps (Omega-3 Fatty Acids) .... Once Daily 6)  Centrum  Chew (Multiple Vitamins-Minerals) .... Once Daily 7)  Nitrolingual 0.4 Mg/spray Soln (Nitroglycerin) .... Use As Directed As Needed 8)  Anucort-Hc 25 Mg Supp (Hydrocortisone Acetate) .... Insert One Suppository Rectally Every Day For Hemorrhoids As Needed 9)  Cvs Sleep Aid 50 Mg Caps (Diphenhydramine Hcl (Sleep)) .... Take 2 Tablet By Mouth At Bedtime 10)  Aspirin 325 Mg Tabs (Aspirin) .... One Tablet By Mouth Once Daily  Allergies (verified): 1)  ! Nitroglycerin  Past History:  Past medical, surgical, family and social histories (including risk factors) reviewed for relevance to current acute and chronic problems.  Past Medical History: Reviewed history from 09/06/2008 and no changes required. MELANOMA (ICD-172.9) UTERINE CANCER, HX OF (ICD-V10.42) LBBB (ICD-426.3) CORONARY ARTERY BYPASS GRAFT, HX OF (ICD-V45.81) ANGINA, UNSTABLE (ICD-411.1) VERTIGO (ICD-780.4) SYNCOPE (ICD-780.2) MYOCARDIAL INFARCTION, HX OF (ICD-412) HYPERTENSION (ICD-401.9) HYPERLIPIDEMIA (ICD-272.4) CORONARY ARTERY DISEASE (ICD-414.00) ATRIAL FIBRILLATION (ICD-427.31)  Past Surgical History: Reviewed history from 04/29/2008 and no changes required. Coronary artery bypass graft--2004 Hysterectomy--uterine Cancer-1988 Oophorectomy melanoma---right pretibeal (distal) PTCA/stent  2009  Family History: Reviewed history from 10/06/2007 and no changes required. father 59 MI mother 62- stroke No FH of Colon Cancer:  Social History: Reviewed history from 12/26/2008 and no changes required. Married-widow 2010 (husband  with [facility]) moved to [place]---family  Never Smoked Alcohol use-yes Regular exercise-yes--not for  a few months  Review of Systems  The patient denies allergy/sinus, anemia, anxiety-new, arthritis/joint pain, back pain, blood in urine, breast changes/lumps, change in vision, confusion, cough, coughing up blood, depression-new, fainting, fatigue, fever, headaches-new, hearing problems, heart murmur, heart rhythm changes, itching, menstrual pain, muscle pains/cramps, night sweats, nosebleeds, pregnancy symptoms, shortness of breath, skin rash, sleeping problems, sore throat, swelling of feet/legs, swollen lymph glands, thirst - excessive , urination - excessive , urination changes/pain, urine leakage, vision changes, and voice change.   General:  Complains of sleep disorder; denies fever, chills, sweats, anorexia, fatigue, weakness, malaise, and weight loss; Benadryl 50 mg regularly at bedtime.. CV:  Denies chest pains, angina, palpitations, syncope, dyspnea on exertion, orthopnea, PND, peripheral edema, and claudication. Resp:  Denies dyspnea at rest, dyspnea with exercise, cough, sputum, wheezing, coughing up blood, and pleurisy. GI:  Denies difficulty swallowing, pain on swallowing, nausea, indigestion/heartburn, vomiting, vomiting blood, abdominal pain, jaundice, gas/bloating, diarrhea, constipation, change in bowel habits, bloody BM's, black BMs, and fecal incontinence. GU:  Denies urinary burning, blood in urine, nocturnal urination, urinary frequency, urinary incontinence, abnormal vaginal bleeding, amenorrhea, menorrhagia, vaginal discharge, pelvic pain, genital sores, painful intercourse, and decreased libido. MS:  Denies joint pain / LOM, joint swelling, joint stiffness, joint deformity, low back pain, muscle weakness, muscle cramps, muscle atrophy, leg pain at night, leg pain with exertion, and shoulder pain / LOM hand / wrist pain (CTS). Neuro:  Denies weakness, paralysis, abnormal sensation,  seizures, syncope, tremors, vertigo, transient blindness, frequent falls, frequent headaches, difficulty walking, headache, sciatica, radiculopathy other:, restless legs, memory loss, and confusion. Psych:  Denies depression, anxiety, memory loss, suicidal ideation, hallucinations, paranoia, phobia, and confusion. Endo:  Denies cold intolerance, heat intolerance, polydipsia, polyphagia, polyuria, unusual weight change, and hirsutism. Heme:  Complains of bruising; denies bleeding, enlarged lymph nodes, and pagophagia.  Vital Signs:  Patient profile:   77 year old female Height:      67.5 inches Weight:      138 pounds BMI:     21.37 BSA:     1.74 Pulse rate:   92 / minute Pulse rhythm:   regular BP sitting:   128 / 64  (left arm) Cuff size:   regular  Vitals Entered By: Ok Anis CMA (October 30, 2009 9:35 AM)  Physical Exam  General:  Well developed, well nourished, no acute distress.healthy appearing.   Head:  Normocephalic and atraumatic. Eyes:  PERRLA, no icterus.exam deferred to patient's ophthalmologist.   Neck:  Supple; no masses or thyromegaly. Lungs:  Clear throughout to auscultation.decreased BS on L and decreased BS on R.   Heart:  Regular rate and rhythm; no murmurs, rubs,  or bruits. Abdomen:  Soft, nontender and nondistended. No masses, hepatosplenomegaly or hernias noted. Normal bowel sounds. Rectal:  Normal exam.hemocult negative.   Extremities:  No clubbing, cyanosis, edema or deformities noted. Neurologic:  Alert and  oriented x4;  grossly normal neurologically. Skin:  Intact without significant lesions or rashes.petechiae.   Psych:  Alert and cooperative. Normal mood and affect.   Impression & Recommendations:  Problem # 1:  PERSONAL HX COLONIC POLYPS (ICD-V12.72) Assessment Unchanged Colonoscopy stated her convenience. We will continue her aspirin but hold her Plavix 5 days before this procedure less otherwise advised by cardiology. She denies gastrointestinal  problems at this time, and stool is guaiac negative. However, she has a strong history of recurrent colon polyps but no records are available for review at this time.  Problem # 2:  UTERINE CANCER, HX OF (ICD-V10.42) Assessment: Comment Only Status Post hysterectomy and removal of both ovaries.  Problem # 3:  CORONARY ARTERY BYPASS GRAFT, HX OF (ICD-V45.81) No current angina symptoms or symptoms of cardiac arrhythmias.She is to continue other cardiac medications as listed and reviewed her records per doctor's [facility].  Patient Instructions: 1)  You are scheduled for a colonoscopy. 2)  The medication list was reviewed and reconciled.  All changed / newly prescribed medications were explained.  A complete medication list was provided to the patient / caregiver. 3)  Copy sent to : Dr. Birdie Sons and Dr. Maisie Fus wall 4)  Please continue current medications.  5)  Constipation and Hemorrhoids brochure given.  6)  Colonoscopy and Flexible Sigmoidoscopy brochure given.  7)  Conscious Sedation brochure given.  8)  Hold Plavix 5 days before colonoscopy but continue aspirin  Appended Document: discuss colon on BT--ch.    Clinical Lists Changes  Medications: Added new medication of MOVIPREP 100 GM  SOLR (PEG-KCL-NACL-NASULF-NA ASC-C) As per prep instructions. - Signed Rx of MOVIPREP 100 GM  SOLR (PEG-KCL-NACL-NASULF-NA ASC-C) As per prep instructions.;  #1 x 0;  Signed;  Entered by: Ashok Cordia RN;  Authorized by: Mardella Layman MD [facility];  Method used: Electronically to [facility]. [place]. [phone]*, [address], [place], [place], [place]  [address], Ph: [phone] or [phone], Fax: [phone] Orders: Added new Test order of Colonoscopy (Colon) - Signed    Prescriptions: MOVIPREP 100 GM  SOLR (PEG-KCL-NACL-NASULF-NA ASC-C) As per prep instructions.  #1 x 0   Entered by:   Ashok Cordia RN   Authorized by:   Mardella Layman MD [facility]   Signed by:   Ashok Cordia RN on  10/30/2009   Method used:   Electronically to        [facility]. [address] [phone]* (retail)       [address]       [place], [place]  [address]       Ph: [phone] or [phone]       Fax: [phone]   RxID:   [id number]

## 2010-05-12 NOTE — Letter (Signed)
Summary: [facility] Instructions  [facility]  [address] [place], [place] [address]   Phone: [phone]  Fax: [phone]       [name] [name]    [birth year]    MRN: [id number]        Procedure Day Dorna Bloom: Wednesday, 12/03/09     Arrival Time: 1:00      Procedure Time: 2:00     Location of Procedure:                    _X _  [facility] (4th Floor)  PREPARATION FOR COLONOSCOPY WITH MOVIPREP   Starting 5 days prior to your procedure 11/28/09 do not eat nuts, seeds, popcorn, corn, beans, peas,  salads, or any raw vegetables.  Do not take any fiber supplements (e.g. Metamucil, Citrucel, and Benefiber).  THE DAY BEFORE YOUR PROCEDURE         DATE: 12/02/09    DAY: Tuesday  1.  Drink clear liquids the entire day-NO SOLID FOOD  2.  Do not drink anything colored red or purple.  Avoid juices with pulp.  No orange juice.  3.  Drink at least 64 oz. (8 glasses) of fluid/clear liquids during the day to prevent dehydration and help the prep work efficiently.  CLEAR LIQUIDS INCLUDE: Water Jello Ice Popsicles Tea (sugar ok, no milk/cream) Powdered fruit flavored drinks Coffee (sugar ok, no milk/cream) Gatorade Juice: apple, white grape, white cranberry  Lemonade Clear bullion, consomm, broth Carbonated beverages (any kind) Strained chicken noodle soup Hard Candy                             4.  In the morning, mix first dose of MoviPrep solution:    Empty 1 Pouch A and 1 Pouch B into the disposable container    Add lukewarm drinking water to the top line of the container. Mix to dissolve    Refrigerate (mixed solution should be used within 24 hrs)  5.  Begin drinking the prep at 5:00 p.m. The MoviPrep container is divided by 4 marks.   Every 15 minutes drink the solution down to the next mark (approximately 8 oz) until the full liter is complete.   6.  Follow completed prep with 16 oz of clear liquid of your choice (Nothing red or purple).   Continue to drink clear liquids until bedtime.  7.  Before going to bed, mix second dose of MoviPrep solution:    Empty 1 Pouch A and 1 Pouch B into the disposable container    Add lukewarm drinking water to the top line of the container. Mix to dissolve    Refrigerate  THE DAY OF YOUR PROCEDURE      DATE: 12/03/09   DAY: Wednesday  Beginning at 9:00 a.m. (5 hours before procedure):         1. Every 15 minutes, drink the solution down to the next mark (approx 8 oz) until the full liter is complete.  2. Follow completed prep with 16 oz. of clear liquid of your choice.    3. You may drink clear liquids until 12:00 (2 HOURS BEFORE PROCEDURE).   MEDICATION INSTRUCTIONS  Unless otherwise instructed, you should take regular prescription medications with a small sip of water   as early as possible the morning of your procedure.    Stop taking Plavix on 11/28/09.   We will check with Dr. Daleen Squibb to make  sure this is OK.             OTHER INSTRUCTIONS  You will need a responsible adult at least 75 years of age to accompany you and drive you home.   This person must remain in the waiting room during your procedure.  Wear loose fitting clothing that is easily removed.  Leave jewelry and other valuables at home.  However, you may wish to bring a book to read or  an iPod/MP3 player to listen to music as you wait for your procedure to start.  Remove all body piercing jewelry and leave at home.  Total time from sign-in until discharge is approximately 2-3 hours.  You should go home directly after your procedure and rest.  You can resume normal activities the  day after your procedure.  The day of your procedure you should not:   Drive   Make legal decisions   Operate machinery   Drink alcohol   Return to work  You will receive specific instructions about eating, activities and medications before you leave.    The above instructions have been reviewed and explained  to me by   _______________________    I fully understand and can verbalize these instructions _____________________________ Date _________

## 2010-05-12 NOTE — Progress Notes (Signed)
Summary: pt needs refill   Phone Note Refill Request Call back at Home Phone [phone] Message from:  Patient on [facility]  Refills Requested: Medication #1:  SIMVASTATIN 80 MG TABS 1 by mouth at bedtime Initial call taken by: Omer Jack,  October 27, 2009 9:44 AM    Prescriptions: SIMVASTATIN 80 MG TABS (SIMVASTATIN) 1 by mouth at bedtime  #90 x 3   Entered by:   Danielle Rankin, CMA   Authorized by:   Gaylord Shih, MD, [facility]   Signed by:   Danielle Rankin, CMA on 10/27/2009   Method used:   Faxed to ...       [facility] (mail-order)             , [place]         Ph: [phone]       Fax: [phone]   RxID:   [id number]

## 2010-05-12 NOTE — Procedures (Signed)
Summary: Colonoscopy  Patient: [name] [name] Note: All result statuses are Final unless otherwise noted.  Tests: (1) Colonoscopy (COL)   COL Colonoscopy           DONE     [facility]     [address]     [place], [place]  [address]           COLONOSCOPY PROCEDURE REPORT           PATIENT:  [name] [name], [name] [name]  MR#:  [id number]     BIRTHDATE:  06-17-1931, 78 yrs. old  GENDER:  female     ENDOSCOPIST:  Vania Rea. Jarold Motto, MD, [facility]     REF. BY:  Birdie Sons, M.D.     PROCEDURE DATE:  12/10/2009     PROCEDURE:  Higher-risk screening colonoscopy G0105           ASA CLASS:  Class II     INDICATIONS:  HX. PF POLYPS 5Y AGO IN [place].     MEDICATIONS:   Fentanyl 75 mcg IV, Versed 6 mg IV           DESCRIPTION OF PROCEDURE:   After the risks benefits and     alternatives of the procedure were thoroughly explained, informed     consent was obtained.  Digital rectal exam was performed and     revealed no abnormalities.   The LB CF-[id number] endoscope     was introduced through the anus and advanced to the cecum, which     was identified by both the appendix and ileocecal valve, without     limitations.  The quality of the prep was excellent, using     MoviPrep.  The instrument was then slowly withdrawn as the colon     was fully examined.     <<PROCEDUREIMAGES>>           FINDINGS:  Scattered diverticula were found in the sigmoid colon.     No polyps or cancers were seen.  This was otherwise a normal     examination of the colon.   Retroflexed views in the rectum     revealed no abnormalities.    The scope was then withdrawn from     the patient and the procedure completed.           COMPLICATIONS:  None     ENDOSCOPIC IMPRESSION:     1) Diverticula, scattered in the sigmoid colon     2) No polyps or cancers     3) Otherwise normal examination     RECOMMENDATIONS:     1) high fiber diet     PRN FOLLOWUP     REPEAT EXAM:  No            ______________________________     Vania Rea. Jarold Motto, MD, [facility]           CC:  Gaylord Shih, MD           n.     Rosalie Doctor:   Vania Rea. Patterson at 12/10/2009 02:31 PM           Elna Breslow, [phone]  Note: An exclamation mark (!) indicates a result that was not dispersed into the flowsheet. Document Creation Date: 12/10/2009 2:33 PM _______________________________________________________________________  (1) Order result status: Final Collection or observation date-time: 12/10/2009 14:27 Requested date-time:  Receipt date-time:  Reported date-time:  Referring Physician:   Ordering Physician: Sheryn Bison [phone]) Specimen Source:  Source: Launa Grill  Order Number: [id number] Lab site:

## 2010-05-12 NOTE — Letter (Signed)
Summary: Anticoagulation Modification Letter  [facility]  [address] [place], [place] [address]   Phone: [phone]  Fax: [phone]    November 03, 2009  Re:    [name] [name] DOB:    Jul 03, 1931 MRN:    [id number]    Dear Dr. Daleen Squibb:  We have scheduled the above patient for an endoscopic procedure. Our records show that  he/she is on anticoagulation therapy. Please advise as to how long the patient may come off their therapy of Plavix prior to the scheduled procedure(s) on December 03, 2009.   Please fax back/or route the completed form to Ashok Cordia RN at [phone].     Thank you for your help with this matter.  Sincerely,  Ashok Cordia RN   Physician Recommendation:  Hold Plavix 5 days prior ________________         Appended Document: Anticoagulation Modification Letter ok to stop Plavix for procedure. Please have her restart when evaluation complete.  Appended Document: Anticoagulation Modification Letter ok to hold Plavix.  Reviewed Juanito Doom, MD  Appended Document: Anticoagulation Modification Letter Lm for tp to call.   Appended Document: Anticoagulation Modification Letter LM for pt to call.    Appended Document: Anticoagulation Modification Letter LM for pt to call.  Appended Document: Anticoagulation Modification Letter Pt notified.

## 2010-05-12 NOTE — Letter (Signed)
Summary: New Patient letter  [facility]  [address] [place], [place] [address]   Phone: [phone]  Fax: [phone]       09/29/2009 MRN: [id number]  [facility] [address] [place], [place]  [address]  Dear Ms. Albino,  Welcome to the Gastroenterology Division at [facility].    You are scheduled to see Dr.  Jarold Motto on 10-30-09 at 9:30a.m. on the 3rd floor at [facility], [address]  We ask that you try to arrive at our office 15 minutes prior to your appointment time to allow for check-in.  We would like you to complete the enclosed self-administered evaluation form prior to your visit and bring it with you on the day of your appointment.  We will review it with you.  Also, please bring a complete list of all your medications or, if you prefer, bring the medication bottles and we will list them.  Please bring your insurance card so that we may make a copy of it.  If your insurance requires a referral to see a specialist, please bring your referral form from your primary care physician.  Co-payments are due at the time of your visit and may be paid by cash, check or credit card.     Your office visit will consist of a consult with your physician (includes a physical exam), any laboratory testing he/she may order, scheduling of any necessary diagnostic testing (e.g. x-ray, ultrasound, CT-scan), and scheduling of a procedure (e.g. Endoscopy, Colonoscopy) if required.  Please allow enough time on your schedule to allow for any/all of these possibilities.    If you cannot keep your appointment, please call [phone] to cancel or reschedule prior to your appointment date.  This allows [place] the opportunity to schedule an appointment for another patient in need of care.  If you do not cancel or reschedule by 5 p.m. the business day prior to your appointment date, you will be charged a $50.00 late cancellation/no-show fee.    Thank you for choosing  [facility] for your medical needs.  We appreciate the opportunity to care for you.  Please visit [place] at our website  to learn more about our practice.                     Sincerely,                                                             The Gastroenterology Division

## 2010-05-12 NOTE — Assessment & Plan Note (Signed)
Summary: 6 month rov/njr/pt rsc from bmp/cjr   Vital Signs:  Patient profile:   77 year old female Weight:      140 pounds Temp:     98.2 degrees F Pulse rate:   72 / minute Resp:     12 per minute BP sitting:   138 / 72  (left arm)  Vitals Entered By: Gladis Riffle, RN (April 25, 2009 2:19 PM)   Primary Care Provider:  Birdie Sons MD   History of Present Illness:  Follow-Up Visit      This is a 77 year old woman who presents for Follow-up visit.  The patient denies chest pain and palpitations.  Since the last visit the patient notes no new problems or concerns.  The patient reports taking meds as prescribed.  When questioned about possible medication side effects, the patient notes none.    Preventive Screening-Counseling & Management  Alcohol-Tobacco     Smoking Status: never  Current Problems (verified): 1)  Grief Reaction, Acute  (ICD-309.0) 2)  Melanoma  (ICD-172.9) 3)  Uterine Cancer, Hx of  (ICD-V10.42) 4)  Lbbb  (ICD-426.3) 5)  Coronary Artery Bypass Graft, Hx of  (ICD-V45.81) 6)  Vertigo  (ICD-780.4) 7)  Syncope  (ICD-780.2) 8)  Myocardial Infarction, Hx of  (ICD-412) 9)  Hypertension  (ICD-401.9) 10)  Hyperlipidemia  (ICD-272.4) 11)  Coronary Artery Disease  (ICD-414.00) 12)  Atrial Fibrillation  (ICD-427.31)  Allergies: 1)  ! Nitroglycerin  Comments:  Nurse/Medical Assistant: 6 month rov, labs done  The patient's medications and allergies were reviewed with the patient and were updated in the Medication and Allergy Lists. Gladis Riffle, RN (April 25, 2009 2:22 PM)  Past History:  Past Medical History: Last updated: 09/06/2008 MELANOMA (ICD-172.9) UTERINE CANCER, HX OF (ICD-V10.42) LBBB (ICD-426.3) CORONARY ARTERY BYPASS GRAFT, HX OF (ICD-V45.81) ANGINA, UNSTABLE (ICD-411.1) VERTIGO (ICD-780.4) SYNCOPE (ICD-780.2) MYOCARDIAL INFARCTION, HX OF (ICD-412) HYPERTENSION (ICD-401.9) HYPERLIPIDEMIA (ICD-272.4) CORONARY ARTERY DISEASE  (ICD-414.00) ATRIAL FIBRILLATION (ICD-427.31)  Past Surgical History: Last updated: 04/29/2008 Coronary artery bypass graft--2004 Hysterectomy--uterine Cancer-1988 Oophorectomy melanoma---right pretibeal (distal) PTCA/stent  2009  Family History: Last updated: 10/06/2007 father 1 MI mother 64- stroke  Social History: Last updated: 12/26/2008 Married-widow 2010 (husband with [employer]) moved to [place]---family  Never Smoked Alcohol use-yes Regular exercise-yes--not for a few months  Risk Factors: Exercise: yes (10/06/2007)  Risk Factors: Smoking Status: never (04/25/2009)  Review of Systems       All other systems reviewed and were negative   Physical Exam  General:  alert and well-developed.   Head:  normocephalic and atraumatic.   Eyes:  pupils equal and pupils round.   Ears:  R ear normal and L ear normal.   Nose:  no external deformity and no external erythema.   Neck:  No deformities, masses, or tenderness noted. Chest Wall:  No deformities, masses, or tenderness noted. Lungs:  normal respiratory effort and no intercostal retractions.   Heart:  normal rate, regular rhythm, and no murmur.   Abdomen:  Bowel sounds positive,abdomen soft and non-tender without masses, organomegaly or hernias noted. Msk:  No deformity or scoliosis noted of thoracic or lumbar spine.   Pulses:  R radial normal and L radial normal.   Neurologic:  cranial nerves II-XII intact and gait normal.     Impression & Recommendations:  Problem # 1:  GRIEF REACTION, ACUTE (ICD-309.0) sxs contine as expected no need for intervention at this time  Problem # 2:  HYPERTENSION (ICD-401.9) reasonable control Her  updated medication list for this problem includes:    Ramipril 5 Mg Caps (Ramipril) .Marland Kitchen... 1 tablet by mouth daily    Metoprolol Tartrate 50 Mg Tabs (Metoprolol tartrate) .Marland Kitchen... 1/2 by mouth once daily  BP today: 138/72 Prior BP: 122/70 (01/07/2009)  Labs Reviewed: K+: 4.6  (04/24/2009) Creat: : 0.9 (04/24/2009)   Chol: 154 (04/24/2009)   HDL: 61.00 (04/24/2009)   LDL: 82 (04/24/2009)   TG: 56.0 (04/24/2009)  Problem # 3:  HYPERLIPIDEMIA (ICD-272.4) controlled. continue current medications  Her updated medication list for this problem includes:    Simvastatin 80 Mg Tabs (Simvastatin) .Marland Kitchen... 1 by mouth at bedtime  Labs Reviewed: SGOT: 24 (04/24/2009)   SGPT: 19 (04/24/2009)   HDL:61.00 (04/24/2009), 53.60 (12/26/2008)  LDL:82 (04/24/2009), 74 (12/26/2008)  Chol:154 (04/24/2009), 138 (12/26/2008)  Trig:56.0 (04/24/2009), 52.0 (12/26/2008)  Problem # 4:  CORONARY ARTERY DISEASE (ICD-414.00) clinically stable Her updated medication list for this problem includes:    Ramipril 5 Mg Caps (Ramipril) .Marland Kitchen... 1 tablet by mouth daily    Metoprolol Tartrate 50 Mg Tabs (Metoprolol tartrate) .Marland Kitchen... 1/2 by mouth once daily    Plavix 75 Mg Tabs (Clopidogrel bisulfate) .Marland Kitchen... Take 1 tablet by mouth once a day    Ecotrin 325 Mg Tbec (Aspirin) ..... Once daily    Nitrolingual 0.4 Mg/spray Soln (Nitroglycerin) ..... Use as directed as needed  Labs Reviewed: Chol: 154 (04/24/2009)   HDL: 61.00 (04/24/2009)   LDL: 82 (04/24/2009)   TG: 56.0 (04/24/2009)  Problem # 5:  ATRIAL FIBRILLATION (ICD-427.31) no recurrence Her updated medication list for this problem includes:    Metoprolol Tartrate 50 Mg Tabs (Metoprolol tartrate) .Marland Kitchen... 1/2 by mouth once daily    Plavix 75 Mg Tabs (Clopidogrel bisulfate) .Marland Kitchen... Take 1 tablet by mouth once a day    Ecotrin 325 Mg Tbec (Aspirin) ..... Once daily  Complete Medication List: 1)  Ramipril 5 Mg Caps (Ramipril) .Marland Kitchen.. 1 tablet by mouth daily 2)  Metoprolol Tartrate 50 Mg Tabs (Metoprolol tartrate) .... 1/2 by mouth once daily 3)  Simvastatin 80 Mg Tabs (Simvastatin) .Marland Kitchen.. 1 by mouth at bedtime 4)  Plavix 75 Mg Tabs (Clopidogrel bisulfate) .... Take 1 tablet by mouth once a day 5)  Ecotrin 325 Mg Tbec (Aspirin) .... Once daily 6)  Fish Oil 1000  Mg Caps (Omega-3 fatty acids) .... Once daily 7)  Centrum Chew (Multiple vitamins-minerals) .... Once daily 8)  Nitrolingual 0.4 Mg/spray Soln (Nitroglycerin) .... Use as directed as needed 9)  Lorazepam 0.5 Mg Tabs (Lorazepam) .... One tablet twice daily 10)  Anucort-hc 25 Mg Supp (Hydrocortisone acetate) .... Insert one suppository rectally every day for hemorrhoids 11)  [facility] Sleep Aid 50 Mg Caps (Diphenhydramine hcl (sleep)) .... Take 1 tablet by mouth at bedtime  Patient Instructions: 1)  Please schedule a follow-up appointment in 6 months. 2)  lipids 272.4 3)  liver 995.2 4)  bmet--995.2 Prescriptions: HYDROCORTISONE ACETATE 30 MG SUPP (HYDROCORTISONE ACETATE) Insert rectally daily for hemmoroids  #30grams x 1   Entered and Authorized by:   Birdie Sons MD   Signed by:   Birdie Sons MD on 04/25/2009   Method used:   Electronically to        [facility] [address] [phone]* (retail)       [address]       [place], [place]  [address]       Ph: [phone] or [phone]  Fax: [phone]   RxID:   [id number]  Rx changed to anucort hc to save money

## 2010-05-12 NOTE — Assessment & Plan Note (Signed)
Summary: ANIMAL SCRATCH TO ARM - EVAL OF SAME// RS   Vital Signs:  Patient profile:   77 year old female Temp:     98.4 degrees F oral BP sitting:   120 / 62  (left arm) Cuff size:   regular  Vitals Entered By: Sid Falcon LPN (January 26, 2010 2:44 PM)  History of Present Illness: Patient seen with flap-type superficial laceration from dog scratching her yesterday. Last tetanus unknown. She cleaned this up with soap and water. Had some bleeding initially. She is on Plavix. No drainage or signs of secondary infection. Minimal pain.  Allergies: 1)  ! Nitroglycerin  Review of Systems      See HPI  Physical Exam  General:  Well-developed,well-nourished,in no acute distress; alert,appropriate and cooperative throughout examination Lungs:  Normal respiratory effort, chest expands symmetrically. Lungs are clear to auscultation, no crackles or wheezes. Heart:  normal rate and regular rhythm.   Extremities:  left forearm reveals flap laceration dorsally with no signs of secondary infection. No active bleeding. Minimal ecchymosis.   Impression & Recommendations:  Problem # 1:  LACERATION (ICD-879.8) antibiotic applied topically and Steri-Strips. Tetanus booster will be given  Complete Medication List: 1)  Ramipril 5 Mg Caps (Ramipril) .Marland Kitchen.. 1 tablet by mouth daily 2)  Metoprolol Tartrate 50 Mg Tabs (Metoprolol tartrate) .... 1/2 by mouth once daily 3)  Simvastatin 80 Mg Tabs (Simvastatin) .Marland Kitchen.. 1 by mouth at bedtime 4)  Plavix 75 Mg Tabs (Clopidogrel bisulfate) .... Take 1 tablet by mouth once a day 5)  Fish Oil 1000 Mg Caps (Omega-3 fatty acids) .... Once daily 6)  Centrum Chew (Multiple vitamins-minerals) .... Once daily 7)  Nitrolingual 0.4 Mg/spray Soln (Nitroglycerin) .... One spray under tongue every 5 minutes as needed for chest pain---may repeat times three 8)  Anucort-hc 25 Mg Supp (Hydrocortisone acetate) .... Insert one suppository rectally every day for hemorrhoids as  needed 9)  [facility] Sleep Aid 50 Mg Caps (Diphenhydramine hcl (sleep)) .... Take 2 tablet by mouth at bedtime 10)  Aspirin 325 Mg Tabs (Aspirin) .... One tablet  3 times a week  Other Orders: Tdap => 25yrs IM (33295) Admin 1st Vaccine (18841)  Patient Instructions: 1)  followup promptly for signs of secondary infection such as redness or drainage   Orders Added: 1)  Est. Patient Level III [66063] 2)  Tdap => 75yrs IM [90715] 3)  Admin 1st Vaccine [01601]   Immunizations Administered:  Tetanus Vaccine:    Vaccine Type: Tdap    Site: left deltoid    Mfr: GlaxoSmithKline    Dose: 0.5 ml    Route: IM    Given by: Sid Falcon LPN    Exp. Date: 03/01/2012    Lot #: [id number]    VIS given: 02/28/08 version given January 26, 2010.   Immunizations Administered:  Tetanus Vaccine:    Vaccine Type: Tdap    Site: left deltoid    Mfr: GlaxoSmithKline    Dose: 0.5 ml    Route: IM    Given by: Sid Falcon LPN    Exp. Date: 03/01/2012    Lot #: [id number]    VIS given: 02/28/08 version given January 26, 2010.

## 2010-05-12 NOTE — Consult Note (Signed)
Summary: [facility]  [facility]   Imported By: Maryln Gottron 12/11/2009 15:26:23  _____________________________________________________________________  External Attachment:    Type:   Image     Comment:   External Document

## 2010-05-13 ENCOUNTER — Other Ambulatory Visit: Payer: Self-pay | Admitting: Internal Medicine

## 2010-05-13 ENCOUNTER — Other Ambulatory Visit: Payer: Self-pay

## 2010-05-13 ENCOUNTER — Encounter: Payer: Self-pay | Admitting: Internal Medicine

## 2010-05-13 ENCOUNTER — Ambulatory Visit: Admit: 2010-05-13 | Payer: Self-pay | Admitting: Internal Medicine

## 2010-05-13 ENCOUNTER — Other Ambulatory Visit (INDEPENDENT_AMBULATORY_CARE_PROVIDER_SITE_OTHER): Payer: MEDICARE

## 2010-05-13 DIAGNOSIS — R0989 Other specified symptoms and signs involving the circulatory and respiratory systems: Secondary | ICD-10-CM

## 2010-05-13 LAB — LIPID PANEL: Triglycerides: 65 mg/dL (ref 0.0–149.0)

## 2010-05-14 DIAGNOSIS — E785 Hyperlipidemia, unspecified: Secondary | ICD-10-CM

## 2010-05-20 ENCOUNTER — Encounter: Payer: Self-pay | Admitting: Internal Medicine

## 2010-05-20 ENCOUNTER — Ambulatory Visit (INDEPENDENT_AMBULATORY_CARE_PROVIDER_SITE_OTHER): Payer: MEDICARE | Admitting: Internal Medicine

## 2010-05-20 DIAGNOSIS — I868 Varicose veins of other specified sites: Secondary | ICD-10-CM

## 2010-05-20 DIAGNOSIS — I10 Essential (primary) hypertension: Secondary | ICD-10-CM

## 2010-05-20 DIAGNOSIS — E785 Hyperlipidemia, unspecified: Secondary | ICD-10-CM

## 2010-05-20 DIAGNOSIS — I4891 Unspecified atrial fibrillation: Secondary | ICD-10-CM

## 2010-05-20 DIAGNOSIS — C439 Malignant melanoma of skin, unspecified: Secondary | ICD-10-CM

## 2010-05-20 DIAGNOSIS — I2510 Atherosclerotic heart disease of native coronary artery without angina pectoris: Secondary | ICD-10-CM

## 2010-05-20 NOTE — Miscellaneous (Signed)
Summary: Orders Update  Clinical Lists Changes  Orders: Added new Test order of TLB-Lipid Panel (80061-LIPID) - Signed 

## 2010-05-20 NOTE — Progress Notes (Signed)
  Subjective:    Patient ID: [name] [name], female    DOB: 27-Mar-1932, 77 y.o.   MRN: [id number]  HPI  F/u multiple medical problems:  Patient with a history of melanoma, hyperlipidemia, hypertension. Assessment coronary artery disease and atrial fibrillation. Has not had any recurrent atrial fibrillation that he knows of. He denies any chest pain, shortness breath, PND or any other complaints related to her cardiopulmonary status. She does take her medications. She has not recorded her blood pressure. She does exercise regularly.   Foot pain  Review of Systems  she admits to intermittent sadness related to her husbands death. No other complaints and a complete review of systems.    Objective:   Physical Exam       Well-developed well-nourished female in no acute distress. HEENT exam atraumatic, normocephalic, extraocular muscles are intact. Neck is supple. No jugular venous distention no thyromegaly. Chest clear to auscultation without increased work of breathing. Cardiac exam S1 and S2 are regular. Abdominal exam active bowel sounds, soft, nontender. Extremities no edema. Neurologic exam she is alert without any motor sensory deficits. Gait is normal.   Assessment & Plan:

## 2010-05-23 ENCOUNTER — Encounter: Payer: Self-pay | Admitting: Internal Medicine

## 2010-05-23 NOTE — Assessment & Plan Note (Signed)
Adequate control. Continue current medications. 

## 2010-05-23 NOTE — Assessment & Plan Note (Signed)
No symptoms. Continue current medications. 

## 2010-05-23 NOTE — Assessment & Plan Note (Signed)
Tolerating medications without difficulty. Continue current medications. She needs lab work.

## 2010-05-23 NOTE — Assessment & Plan Note (Signed)
No known recurrence 

## 2010-05-23 NOTE — Assessment & Plan Note (Signed)
She has regular dermatology followup. No known recurrence.

## 2010-07-21 LAB — POCT I-STAT, CHEM 8
BUN: 10 mg/dL (ref 6–23)
HCT: 38 % (ref 36.0–46.0)
Hemoglobin: 12.9 g/dL (ref 12.0–15.0)
Sodium: 138 mEq/L (ref 135–145)
TCO2: 27 mmol/L (ref 0–100)

## 2010-07-21 LAB — CBC
HCT: 35.8 % — ABNORMAL LOW (ref 36.0–46.0)
MCHC: 34.3 g/dL (ref 30.0–36.0)
Platelets: 100 10*3/uL — ABNORMAL LOW (ref 150–400)
Platelets: 96 10*3/uL — ABNORMAL LOW (ref 150–400)
Platelets: 99 10*3/uL — ABNORMAL LOW (ref 150–400)
RBC: 3.86 MIL/uL — ABNORMAL LOW (ref 3.87–5.11)
RDW: 13.6 % (ref 11.5–15.5)
RDW: 13.6 % (ref 11.5–15.5)
WBC: 3.6 10*3/uL — ABNORMAL LOW (ref 4.0–10.5)
WBC: 4.4 10*3/uL (ref 4.0–10.5)

## 2010-07-21 LAB — CARDIAC PANEL(CRET KIN+CKTOT+MB+TROPI)
CK, MB: 2.8 ng/mL (ref 0.3–4.0)
Relative Index: INVALID (ref 0.0–2.5)
Relative Index: INVALID (ref 0.0–2.5)
Relative Index: INVALID (ref 0.0–2.5)
Total CK: 68 U/L (ref 7–177)
Total CK: 91 U/L (ref 7–177)
Troponin I: 0.01 ng/mL (ref 0.00–0.06)
Troponin I: 0.13 ng/mL — ABNORMAL HIGH (ref 0.00–0.06)

## 2010-07-21 LAB — LIPID PANEL
Cholesterol: 115 mg/dL (ref 0–200)
LDL Cholesterol: 66 mg/dL (ref 0–99)
Total CHOL/HDL Ratio: 3 RATIO

## 2010-07-21 LAB — URINALYSIS, ROUTINE W REFLEX MICROSCOPIC
Glucose, UA: NEGATIVE mg/dL
Leukocytes, UA: NEGATIVE
Protein, ur: NEGATIVE mg/dL
pH: 7.5 (ref 5.0–8.0)

## 2010-07-21 LAB — DIFFERENTIAL
Basophils Absolute: 0 10*3/uL (ref 0.0–0.1)
Basophils Relative: 0 % (ref 0–1)
Eosinophils Relative: 2 % (ref 0–5)
Lymphocytes Relative: 23 % (ref 12–46)

## 2010-07-21 LAB — TSH: TSH: 1.285 u[IU]/mL (ref 0.350–4.500)

## 2010-07-21 LAB — CK TOTAL AND CKMB (NOT AT ARMC)
CK, MB: 3.8 ng/mL (ref 0.3–4.0)
Relative Index: 3.1 — ABNORMAL HIGH (ref 0.0–2.5)

## 2010-07-21 LAB — BASIC METABOLIC PANEL
BUN: 10 mg/dL (ref 6–23)
CO2: 28 mEq/L (ref 19–32)
Calcium: 8.4 mg/dL (ref 8.4–10.5)
Chloride: 105 mEq/L (ref 96–112)
Creatinine, Ser: 0.75 mg/dL (ref 0.4–1.2)
GFR calc Af Amer: >60 mL/min (ref >60)
GFR calc Af Amer: >60 mL/min (ref >60)
GFR calc non Af Amer: >60 mL/min (ref >60)
Potassium: 3.3 mEq/L — ABNORMAL LOW (ref 3.5–5.1)

## 2010-07-21 LAB — TROPONIN I: Troponin I: <0.01 ng/mL (ref 0.00–0.06)

## 2010-07-21 LAB — POCT CARDIAC MARKERS: Myoglobin, poc: 60.3 ng/mL (ref 12–200)

## 2010-07-21 LAB — URINE MICROSCOPIC-ADD ON

## 2010-07-23 ENCOUNTER — Ambulatory Visit (INDEPENDENT_AMBULATORY_CARE_PROVIDER_SITE_OTHER): Payer: MEDICARE

## 2010-07-23 DIAGNOSIS — I83893 Varicose veins of bilateral lower extremities with other complications: Secondary | ICD-10-CM

## 2010-07-28 ENCOUNTER — Other Ambulatory Visit: Payer: Self-pay | Admitting: Cardiology

## 2010-08-25 NOTE — Consult Note (Signed)
NEW PATIENT CONSULTATION   [name] [name], [name] [name]  DOB:  1931/10/10                                       11/24/2009  ZOXWR#:[id number]   Patient is a healthy 76 year old female with bulging varicosities in her  right groin and thigh area which have increased in size over the past  several years and currently bother her by causing aching, throbbing, and  burning discomfort which worsens as the day progresses.  She does not  wear elastic compression stockings but states that elevating the legs  does help symptoms slightly.  She not take pain medicine on a regular  basis.  She has no history of deep venous thrombosis, thrombophlebitis,  pulmonary emboli, or other venous problems.  She has had the left great  saphenous vein removed for coronary artery bypass grafting but no  surgery on the right leg.   CHRONIC MEDICAL PROBLEMS:  1. Hypertension.  2. Coronary artery disease, previous myocardial infarction with      coronary artery bypass grafting in 2004.  3. History of hyperlipidemia, not currently.  4. Negative for COPD, diabetes or stroke.   PAST SURGICAL HISTORY:  1. Hysterectomy.  2. Uterine tuck.  3. Coronary artery bypass grafting.   SOCIAL HISTORY:  She is widowed, has 3 children, is retired.  Does not  use tobacco.  Drinks occasional alcohol.   FAMILY HISTORY:  Positive for coronary artery disease in her father, who  died at age 50 of a heart attack.  Mother died at age 42 of a stroke.  Negative for diabetes.   REVIEW OF SYSTEMS:  Negative for chest pain, dyspnea on exertion,  history of atrial fibrillation but not now.  Does take chronic Plavix  with occasional bruising.  No chronic bronchitis, asthma, wheezing,  claudication.  All other systems in review of systems are negative.   PHYSICAL EXAMINATION:  Blood pressure 135/67, heart rate 64,  respirations 20.  General:  She is a well-developed, well-nourished  female in no apparent distress, alert and  oriented x3.  HEENT:  Normal  for age.  EOMs intact.  Lungs:  Clear to auscultation.  No rhonchi or  wheezing.  Cardiovascular:  Regular rhythm.  No murmurs.  Carotid pulses  are 3+.  No bruits.  Abdomen:  Soft, nontender with no masses.  Musculoskeletal:  Free of major deformities.  Neurologic:  Normal.  Skin  is free of rashes.  Lower extremity exam reveals 3+ femoral, popliteal,  and dorsalis pedis pulses bilaterally.  Left leg is free of  varicosities.  There is a prominent varix extending from the  saphenofemoral junction in the right leg anteriorly and laterally down  the thigh about halfway.  There are no bulging components to this.  There is no distal edema.  Hyperpigmentation and ulceration are noted.   Today I ordered a venous duplex exam of the right leg, which I have  reviewed and interpreted.  The right great saphenous vein is small with  no reflux.  There is some reflux in the anterior lateral branch which is  quite tortuous and short segment communicating with these above-named  varicosities.  The deep system has some mild reflux.   I think the best treatment for this tortuous varix in the right thigh  which is symptomatic would be sclerotherapy.  I have discussed that with  her today,  and she will schedule it in the near future.  She does not  require any closure of the saphenous system at this time.     Quita Skye Hart Rochester, M.D.  Electronically Signed   JDL/MEDQ  D:  11/24/2009  T:  11/24/2009  Job:  [id number]   cc:   [name]. Swords, MD

## 2010-08-25 NOTE — Assessment & Plan Note (Signed)
[facility]                            CARDIOLOGY OFFICE NOTE   NAME:[name], [name]                        MRN:          [id number]  DATE:04/30/2008                            DOB:          29-Jul-1931    [name] [name].  She has finished cardiac rehab  and she has nothing but good things to say about it except I am glad,  my insurance paid for it.   She is having no dyspnea on exertion which was or anginal equivalent.   She is now 3 months out from intervention to her native LAD.  Please see  the cardiac cath and intervention note by Dr. Charlies Constable and also my  last note December 28, 2007.   We stopped her Coumadin with good LV function and only remote  postoperative atrial fib.  She has had no symptoms of recurrent atrial  fib.   Her visit to Dr. Cato Mulligan the other day went well.  Her total cholesterol  is 159, normal triglycerides, HDL 57.2, total cholesterol of HDL ratio  of 2.8, LDL of 81.  CBC, comprehensive metabolic panel, TSH were normal.   CURRENT MEDICATIONS:  1. Altace 5 mg per day.  2. Enteric-coated aspirin 325 mg per day.  3. Plavix 75 mg per day.  4. Lopressor 25 mg p.o. b.i.d.  5. Fish oil 1000 mg once a day.  6. Simvastatin 80 mg p.o. nightly.  7. She is on sublingual nitroglycerin.   PHYSICAL EXAMINATION:  VITAL SIGNS:  Blood pressure today is 120/63,  pulse is 66 and regular, weight is 139; down 4.  HEENT:  Unchanged.  NECK:  Supple.  Carotid upstrokes were equal bilaterally without bruits.  Thyroid is not enlarged.  Trachea is midline.  NECK:  Supple.  No lymphadenopathy.  LUNGS:  Clear to auscultation and percussion.  HEART:  Reveals a nondisplaced PMI.  Normal S1 and S2.  Sternotomy site  is stable.  ABDOMEN:  Soft, good bowel sounds.  No midline bruit.  EXTREMITIES:  No cyanosis, clubbing, or edema.  She has some varicose  veins, but no sign of phlebitis or DVT.  NEUROLOGICAL:  Grossly  intact.  SKIN:  Few ecchymoses.   ASSESSMENT AND PLAN:  [name] [name] is doing well.  I have made no changes  to her medical program.  I think we should continue Plavix for at least  a year and perhaps thereafter depending  on her tolerance and ability to pay as well.  I have made no other  changes in her medical program.  We will plan on seeing her back again  in 3 months.     Thomas C. Daleen Squibb, MD, [facility]  Electronically Signed    TCW/MedQ  DD: 04/30/2008  DT: 05/01/2008  Job #: [id number]

## 2010-08-25 NOTE — Assessment & Plan Note (Signed)
[facility]                            CARDIOLOGY OFFICE NOTE   NAME:Gehrig, KHAMORA                        MRN:          [id number]  DATE:11/02/2007                            DOB:          February 26, 1932    I have asked to consult on Elna Breslow by Dr. Luisa Hart and Dr. Birdie Sons for coronary disease establish with her as her cardiologist.   HISTORY OF PRESENT ILLNESS:  She is a delightful 77 year old married  white female who just moved here from [place].  Her daughter is a nurse  [name] works with Dr. Delford Field and her son-in-law is Juleen Starr who is a  Surveyor, quantity. D. at [employer] an expert in anticoagulation.   Her cardiac history dates back in 2004 when she presented with what  sounds like an acute coronary syndrome.  At that time, she had three-  vessel disease and underwent a bypass surgery with the left internal  mammary graft LAD, vein graft to diagonal one, vein graft to OM, vein  graft to RCA.  She had a followup stress nuclear study last in March of  2009.  Her EF was 61% or 55% by gated wall motion analysis.  She has a  left bundle.  She had good exercise tolerance with no diagnostic ST-  segment changes.  There was no definite ischemia.  EF was felt to be  improved compared to previous results.  An echocardiogram at the same  time showed EF of 40%, however, with inferior septal wall motion  abnormalities, mild biatrial enlargement, mild aortic insufficiency,  mild mitral regurgitation, and mild tricuspid regurgitation.   She is asymptomatic at present with no symptoms of ischemia or angina.   She denies any orthopnea, PND, or peripheral edema, except maybe just a  little bit at the end of the day.  She has noticed a swelling since she  cut back on her exercise program with the business of her move.   PAST MEDICAL HISTORY:  History of atrial fibrillation, which sounds like  it happened about a month after bypass surgery.  She was very  symptomatic.  She sounds like she has not had it sensed.  She has a  chronic left bundle branch block.  She is on anticoagulation, which is  followed by Juleen Starr.  She also has hyperlipidemia and this is followed  by Dr. Cato Mulligan.   ALLERGIES:  She is sensitive to NITROGLYCERIN.  She has no other drug  allergies.  She has no dye allergy.   PAST SURGICAL HISTORY:  Hysterectomy 1988, she has coronary bypass  surgery in July of 2004, abdominal prolapse repair in 2006, and cataract  surgery in 2005.   CURRENT MEDICATIONS:  1. Altace 5 mg a day.  2. Metoprolol tartrate, we think half tablet b.i.d.  3. Jantoven 2.5 mg a day.  4. Simvastatin 80 mg q.h.s.  She is not taking aspirin nor is      intolerant to it.   FAMILY HISTORY:  Father died at age 67.  He is a heavy smoker.   SOCIAL HISTORY:  She is a retired Optometrist since 1989.  She just moved  back here to be close to her children.  She has 3 children.  She does  not smoke, she never has.  She has couple of alcoholic beverages a day.   REVIEW OF SYSTEMS:  Other than seasonal allergies and some occasional  constipation, history of gout negative other than the HPI.  All systems  were questioned.   PHYSICAL EXAMINATION:  VITAL SIGNS:  Her exam today, her blood pressure  is 131/67, her pulse is 62 by EKG, she has normal sinus rhythm with a  left bundle, she is 5 feet and 7-1/2 inches, and weight is 143 pounds.  HEENT:  Normocephalic and atraumatic.  PERRLA.  Extraocular movements  intact.  Sclerae are clear.  Face symmetry is normal.  Carotids are  equal bilaterally without bruits.  No JVD.  Thyroid is not enlarged.  Trachea is midline.  LUNGS:  Clear.  HEART:  Regular rate and rhythm, paradoxically split and S2.  She has a  soft systolic murmur at the apex.  ABDOMINAL:  Soft and good bowel sounds.  No midline bruit.  No  hepatomegaly.  EXTREMITIES:  No cyanosis or clubbing.  There is only trace edema.  Pulses were 2+ or 4+  bilaterally symmetrical.  She has some varicose  veins.  She has a few ecchymoses of her upper extremities.   ASSESSMENT AND PLAN:  Ms. Tyrell history is well outlined above.  There seems to be some discrepancy in her left ventricular function  based on the nuclear stress study and echo in March of 2009.  I have  shared this information with her.  I suspect her echo is more accurate  based on her history.   She has only had one episode clinically of postoperative atrial fib, but  with her being on Coumadin and also question of LV dysfunction, we will  leave her on it at present.  I have told her many episodes of atrial fib  are asymptomatic.   We will add 81 mg of enteric-coated aspirin per day for antiplatelet  effect.  She has no bleeding diathesis or propensity.   We will plan on seeing her back in October, at which time I will do a 2-  D echocardiogram here.     Thomas C. Daleen Squibb, MD, [facility]  Electronically Signed    TCW/MedQ  DD: 11/02/2007  DT: 11/03/2007  Job #: [id number]   cc:   Charlcie Cradle. Delford Field, MD, FCCP  Bruce H. Swords, MD

## 2010-08-25 NOTE — Cardiovascular Report (Signed)
NAMESUSETTE, SEMINARA               ACCOUNT NO.:  [id number]   MEDICAL RECORD NO.:  [id number]          PATIENT TYPE:  OIB   LOCATION:  NA                           FACILITY:  [facility]   PHYSICIAN:  Bruce R. Juanda Chance, MD, FACCDATE OF BIRTH:  Jan 14, 1932   DATE OF PROCEDURE:  12/01/2007  DATE OF DISCHARGE:                            CARDIAC CATHETERIZATION   CLINICAL HISTORY:  Ms. Forrer is 77 years old and was referred by Dr.  Daleen Squibb for evaluation for catheterization in [facility].  She is a mother of  Wayne Sever and mother-in-law of J. Michaell Cowing.  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  She had been living in [place], but moved back to be closer to her  daughter about 6 months ago.  In 2004, she had bypass surgery in [place],  [place].  Recently, she developed exertional chest tightness.  She  previously had a Myoview scan in about 6 months ago, which showed no  evidence of ischemia.   PROCEDURE:  The procedure was performed by the right femoral artery  arterial sheath and 4-French pyriform coronary catheters. A front wall  arterial puncture was performed, and Omnipaque contrast was used.  We  used a multipurpose catheter and injection of the vein graft to the  right coronary.  We performed the right heart catheterization __________  to try to identify the cause for her exertional tightness and shortness  of breath.  She tolerated the procedure well and left the laboratory in  satisfactory condition.   RESULTS:  Left main coronary artery:  The left main coronary artery was  free of disease.   Left anterior descending artery:  The left anterior descending artery  had a 90% stenosis in the first diagonal branch.  There is a long 70%  stenosis in the proximal mid LAD.  The very tip of the insertion of the  mammary artery could be seen in the mid-to-distal vessel.  Distal vessel  is free of major obstruction.   There was competing flow in  the diagonal branch.   The circumflex artery:  The circumflex artery gave rise to first  marginal branch and AV branch and a second marginal branch.  There was  4% proximal stenosis in the circumflex artery.  There was 90% stenosis  in the proximal portion of the second marginal branch with some  competing flow distally.   The right coronary:  The right coronary is __________completely distally  after a conus branch and the right ventricular branch.   The saphenous vein graft to the distal right coronary was patent and  functioned normally.  This graft filled a posterior descending  and 3  posterolateral branches.   The saphenous vein graft to the second marginal branch of circumflex was  patent and functioned normally.   The saphenous vein graft to diagonal branch LAD was patent and  functioned normally.   The LIMA graft to LAD was atrophic and appeared to be occluded in its  distal portion.   The left Ventriculogram:  The left ventriculogram was performed  on RAO  projection showed a good wall motion.  Estimated ejection fraction was  50-55%.   HEMODYNAMIC DATA:  The right artery pressure was 4 mean.  The pulmonary  artery pressure was 28/8 with mean of 17.  The pulmonary artery wedge  pressure was 11.  The left ventricular pressure was 130/16 and the  aortic pressure was 130/61 with mean of 89.  Cardiac output/cardiac  index was 3.9/2.2 liters/minute/meter squared by Fick.   CONCLUSION:  1. Coronary artery status post prior coronary bypass graft surgery.  2. Severe native vessel disease with 90% stenosis in the first      diagonal branch LAD and 70% stenosis in the proximal LAD, 40%      narrowing in the proximal circumflex artery with 90% stenosis in      the second marginal branch circumflex artery, and total occlusion      of distal right coronary artery.  3. Patent vein graft to the distal right coronary artery, patent vein      graft to the second marginal branch of  the circumflex artery,      patent vein graft to the diagonal branch LAD, and severely atrophic      LIMA graft to the LAD.  4. Normal LV function with estimated fraction of 50-55%.  5. Normal right-sided heart pressures.   RECOMMENDATIONS:  The patient has an atrophic LIMA graft to LAD, but the  LAD does not have critical disease.  I am not certain whether the  stenosis in the LAD is not to be flow-limiting caused angina.  It looks  like it probably would not be, but I cannot be certain.  She had a  Myoview scan 6 months ago and I discussed with Dr. Daleen Squibb he may want to  repeat that Myoview scan.  I also discussed with Dr. Daleen Squibb an empiric  trial of antianginal therapy.  She is currently on metoprolol 50 mg a  day, aspirin, simvastatin, and Altace.      Bruce Elvera Lennox Juanda Chance, MD, [facility]  Electronically Signed     BRB/MEDQ  D:  12/01/2007  T:  12/02/2007  Job:  [id number]   cc:   Thomas C. Wall, MD, FACC  Bruce H. Swords, MD  Cardiopulmonary Lab

## 2010-08-25 NOTE — Discharge Summary (Signed)
[name], [name]               ACCOUNT NO.:  [id number]   MEDICAL RECORD NO.:  [id number]          PATIENT TYPE:  INP   LOCATION:  3705                         FACILITY:  [facility]   PHYSICIAN:  Noralyn Pick. Eden Emms, MD, FACCDATE OF BIRTH:  October 23, 1931   DATE OF ADMISSION:  08/30/2008  DATE OF DISCHARGE:  08/31/2008                               DISCHARGE SUMMARY   PRIMARY CARDIOLOGIST:  Maisie Fus C. Daleen Squibb, MD, [facility]   PRIMARY CARE [name] [name]:  [name]. Swords, MD   DISCHARGE DIAGNOSIS:  Chest pain.   SECONDARY DIAGNOSES:  1. Coronary artery disease, status post coronary artery bypass graft      in 2004 with percutaneous coronary intervention stenting of the      native left anterior descending with placement of 2 PROMUS drug-      eluting stents in September 2009.  2. Hyperlipidemia.  3. Chronic left bundle-branch block.  4. Remote history of postoperative atrial fibrillation.  5. Status post hysterectomy in 1998.  6. History of cataract status post surgery in 2005.  7. Hypertension.  8. Thrombocytopenia.   ALLERGIES:  She is sensitive to NITROGLYCERIN although, not intolerant.   PROCEDURES:  Left heart cardiac catheterization revealing 3 or 4 patent  grafts with a known arteritic left internal mammary artery to the left  anterior descending.  She has native multi-vessel coronary artery  disease.  There were no culprits for  intervention.   HISTORY OF PRESENT ILLNESS:  A 77 year old married Caucasian female with  the above problem list.  She was in her usual state of health until Aug 30, 2008, when she had sudden onset of lightheadedness and dizziness  similar to what she experienced prior to her PCI in September 2009.  She  also had an episode of confusion while she was here at the [facility]  visiting her husband.  Because of her lightheadedness and confusion she  was taken down to the [facility] in ED  via wheelchair, and while in the  wheelchair developed anterior substernal chest  tightness reminiscent of  a previous MI pain.  In the emergency room, she was nitroglycerin with  improvement in symptoms.  She was admitted for evaluation.   HOSPITAL COURSE:  The patient's first 2 set of cardiac markers were  normal.  Although, her third set this morning shows troponin of 0.12  with normal CKs and MBs.  She underwent cardiac catheterization on the  afternoon of Aug 30, 2008, which showed 3 or 4 patent grafts with an  arteritic LIMA to the LAD which was old.  She had native multivessel  coronary artery disease; however, she was felt to be well  revascularized.  EF was 60%.  During catheterization there was  difficulty achieving access via the right femoral artery and the  procedure was performed via the left femoral artery.  Followup abdominal  aortography revealed that the right femoral artery sheath was in fact in  a false lumen.  She has had no complications from the site post  procedure and has good distal pulses.  We will plan for an outpatient  right groin ultrasound in approximately 2-3 weeks at the time of  followup.  [name] [name] has had no additional chest discomfort.  We  discharged home today in good condition.   DISCHARGE LABORATORY DATA:  Hemoglobin 12.3, hematocrit 35.8, WBC 4.4,  and platelets 96,000.  Sodium 139, potassium 3.9, chloride 105, CO2 of  28, BUN 10, creatinine 0.75, glucose 94, calcium 8.4, CK is 68, MB 3.0,  troponin I 0.12, total cholesterol 115, triglyceride 57, HDL 38, LDL 66.  TSH 1.285.  Urinalysis was normal.   DISPOSITION:  The patient will be discharged home today in good  condition.   FOLLOWUP PLANS AND APPOINTMENTS:  We will arrange for followup with Dr.  Daleen Squibb in approximately 2 weeks.  She is asked to follow up with Dr.  Cato Mulligan as previously scheduled.   DISCHARGE MEDICATIONS:  1. Aspirin 81 mg daily.  2. Plavix 75 mg daily.  3. Metoprolol 50 mg half a tablet b.i.d.  4. Ramipril 5 mg daily.  5. Simvastatin 80 mg at  bedtime.  6. Nitroglycerin 0.4 mg subcu p.r.n. chest pain.   OUTSTANDING LAB STUDIES:  None.   DURATION OF DISCHARGE ENCOUNTER:  Forty five minutes including physician  time.      Nicolasa Ducking, ANP      Noralyn Pick. Eden Emms, MD, [facility]  Electronically Signed    CB/MEDQ  D:  08/31/2008  T:  09/01/2008  Job:  [id number]   cc:   [name]. Swords, MD

## 2010-08-25 NOTE — Discharge Summary (Signed)
[name], [name]               ACCOUNT NO.:  [id number]   MEDICAL RECORD NO.:  [id number]          PATIENT TYPE:  INP   LOCATION:  6523                         FACILITY:  [facility]   PHYSICIAN:  Verne Carrow, MDDATE OF BIRTH:  17-Jan-1932   DATE OF ADMISSION:  12/14/2007  DATE OF DISCHARGE:  12/15/2007                               DISCHARGE SUMMARY   PRIMARY CARDIOLOGIST:  Maisie Fus C. Wall, MD, [facility].   PRIMARY CARE Wayde Gopaul:  [name] [name]. Swords, MD   DISCHARGE DIAGNOSIS:  Unstable angina.   SECONDARY DIAGNOSES:  1. Coronary artery disease status post coronary artery bypass grafting      x4 in 2004.  2. Hyperlipidemia.  3. Chronic left bundle branch block.  4. Remote postop atrial fibrillation.  5. Status post hysterectomy in 1988.  6. History of cataracts status post surgery in 2005.   ALLERGIES:  She reports previous sensitive to NITROGLYCERIN.   PROCEDURES:  Successful percutaneous coronary intervention, rotational  atherectomy, and drug-eluting stent placement to the left anterior  descending.   HISTORY OF PRESENT ILLNESS:  A 77 year old female with prior history of  coronary artery disease status post CABG in 2004.  She recently saw Dr.  Daleen Squibb in clinic on November 28, 2007, secondary to exertional chest pain  and dyspnea.  Following this, she underwent cardiac catheterization on  December 01, 2007, revealing 4 of 4 patent grafts though the LIMA was felt  to be atrophic, and she did have a 70% stenosis in the proximal LAD, at  which point, it was unclear if this was flow limiting.  As a result, we  evaluated her with an adenosine Myoview on December 05, 2007, which was  overall felt to be low risk with an EF of 55%; however, we were unable  to rule out very mild apical ischemia.  This being the case, she would  benefit from PCI of the LAD.   HOSPITAL COURSE:  The patient re-presented to the  Cardiac Cath Lab on  December 14, 2007, where she underwent repeat angiography of  the LAD and  subsequent rotational atherectomy and placement of 2.75 x 23 mm Promus  drug-eluting stent, as well as 3.0 x 15 mm Promus drug-eluting stent.  The patient tolerated this procedure well and postprocedure, has had no  recurrence of the symptoms or limitations.  She has been ambulating  without difficulty and will be discharged home today in good condition.   Notably, Ms. [name] [name] has previously maintained on Coumadin therapy  secondary to a history of postop atrial fibrillation.  We got to have  discontinue this, and she will remain on aspirin and Plavix for at least  the next year.   DISCHARGE LABORATORY:  Hemoglobin 11.7, hematocrit 34.0, WBC 4.8,  platelets 112, MCV 91.7.  Sodium 138, potassium 3.9, chloride 105, CO2  28, BUN 11, creatinine 0.86, glucose 97, calcium 8.5.   DISPOSITION:  The patient is being discharged home today in good  condition.   FOLLOWUP PLANS AND APPOINTMENTS:  We have arranged a followup with  Valera Castle on December 28, 2007, at 4:00 p.m.  She will follow with  Dr. Cato Mulligan as previously scheduled.   DISCHARGE MEDICATIONS:  1. Aspirin 325 mg daily.  2. Plavix 75 mg daily.  3. Lopressor 25 mg b.i.d.  4. Altace 5 mg daily.  5. Vytorin 10/40 mg nightly.  6. Nitroglycerin 0.4 mg sublingual p.r.n. chest pain.   OUTSTANDING LAB STUDIES:  None.   DURATION DISCHARGE/ENCOUNTER:  A 40 minutes including physician time.      Nicolasa Ducking, ANP      Verne Carrow, MD  Electronically Signed    CB/MEDQ  D:  12/15/2007  T:  12/15/2007  Job:  [id number]   cc:   [name] [name]. Swords, MD

## 2010-08-25 NOTE — Assessment & Plan Note (Signed)
[facility]                                 ON-CALL NOTE   NAME:Ricke, [name]                        MRN:          [id number]  DATE:12/01/2007                            DOB:          December 01, 1931    PRIMARY CARDIOLOGIST:  Jesse Sans. Wall, MD, [facility]   I received a call this evening from the husband of patient [name] [name], his name is [name] [name].  Ms. Arango apparently underwent  cardiac catheterization this morning in the [facility] and around 5 o'clock  this evening noted some oozing from the right groin catheterization  site.  She has been on bedrest, and she has been holding gentle pressure  for the past 2 hours and there is still some mild oozing.  Mr. Hornberger is  asking what would be the best course of action.  I recommended that they  can come into the emergency room and we can evaluate and likely inject  the groin with lidocaine with epinephrine and then discharge her from  the emergency room if that was felt to be appropriate.  Mr. Lenis has  indicated that he will discuss this with his wife and likely come into  the emergency room.      [name] [name], ANP  Electronically Signed      Pricilla Riffle, MD, [facility]  Electronically Signed   CB/MedQ  DD: 12/04/2007  DT: 12/04/2007  Job #: [id number]

## 2010-08-25 NOTE — Assessment & Plan Note (Signed)
[facility]                            CARDIOLOGY OFFICE NOTE   NAME:Isaac, SARENITY                        MRN:          [id number]  DATE:12/28/2007                            DOB:          1931/12/13    Ms. Dusseau returns today for followup.  She was having exertional chest  discomfort and shortness of breath.  Cardiac catheterization on December 01, 2007, showed 4/4 patent grafts though her LIMA was felt to be  atrophic.  She had a 70% stenosis in the proximal LAD.   It was felt by Dr. Juanda Chance and I that she needed a functional study prior  to doing any complex intervention on this lesion.  We did a stress  Myoview which showed apical ischemia.  She had an EF of 55%.   She was brought in as an outpatient and underwent rotational atherectomy  and drug-eluting stent to the proximal LAD.  A good result was achieved.   Since that time, she has had the stomach flu and has not been that  active.  However, she has had no angina and no shortness of breath.   She is entering cardiac rehab which she is excited about.  She says they  do a really good job.   We also discussed amongst ourselves that she can come off Coumadin.  She  had remote postoperative atrial fib post bypass surgery in 2004 and now  has good LV function where as before she has some compromised LV  function.  She was also very symptomatic with her atrial fib.   Her only new medication is Plavix 75 mg a day.  She will be on this for  a year.   PHYSICAL EXAMINATION:  GENERAL:  Very pleasant lady in no acute  distress.  VITAL SIGNS:  Blood pressure is 121/63, her pulse is 63 and regular, her  weight is 143.  HEENT:  Normal.  NECK:  Carotids are full.  Thyroid is not enlarged.  No JVD.  LUNGS:  Clear to auscultation and percussion.  HEART:  Reveals soft S1 and S2.  She has a paradoxically split S2.  ABDOMEN:  Soft.  Good bowel sounds.  Cath sites are stable.  EXTREMITIES:  No cyanosis,  clubbing, or edema.  Pulses are intact.  NEUROLOGIC:  Intact.   Her electrocardiogram shows normal sinus rhythm with no ventricular  conduction delay.   I had a long talk with Ms. Dant.  I am delighted she is doing well.  We will continue with Plavix for a year at a minimum.  If she has any  symptoms of atrial fib, she knows to report to [place] immediately.  We will  plan on seeing her back again in 4 months.      Thomas C. Daleen Squibb, MD, [facility]  Electronically Signed    TCW/MedQ  DD: 12/28/2007  DT: 12/29/2007  Job #: [id number]

## 2010-08-25 NOTE — Cardiovascular Report (Signed)
[name], [name]               ACCOUNT NO.:  [id number]   MEDICAL RECORD NO.:  [id number]          PATIENT TYPE:  INP   LOCATION:  3705                         FACILITY:  [facility]   PHYSICIAN:  Rollene Rotunda, MD, FACCDATE OF BIRTH:  Jul 23, 1931   DATE OF PROCEDURE:  08/30/2008  DATE OF DISCHARGE:                            CARDIAC CATHETERIZATION   PRIMARY:  [name]. Swords, MD   CARDIOLOGIST:  Jesse Sans. Wall, MD, [facility]   PROCEDURE:  Left heart catheterization/coronary arteriography.   INDICATION:  Evaluate the patient with chest pain.  She has also had  bypass grafting.  She has had a known atretic LIMA to her LAD.  Last  year she had stenting of her LAD.   PROCEDURE NOTE:  Left heart catheterization was attempted via the right  femoral artery.  However, after cannulating the vessel easily with an  anterior wall puncture and inserted a guidewire.  I then inserted the  sheath.  I had difficulty advancing the guidewire above the sheath.  This approach was then abandoned.  I was afraid it might be in a false  lumen.  I went to the left side and cannulated this vessel with an  anterior wall puncture.  A #4-French arterial sheath was inserted via  the modified Seldinger technique.  Preformed Judkins and pigtail  catheter were utilized.  There were no complications from this side.  The distal aortogram was described at the end of this procedure.  The  patient had no pain throughout the procedure.  She had 2+ distal pulses  in both vessels throughout the procedure and afterward.  Sheaths were  pulled in the room.  There was adequate hemostasis and again normal  pulses afterwards.   RESULTS:  Hemodynamics:  LV 129/7, AO 129/57.  Coronaries:  Left main was normal.  The LAD had a proximal stent, which  was widely patent with mild in-stent luminal irregularities.  A first  diagonal was seen to be occluded at the ostium.  It filled via vein  graft.  There was a 30% stenosis immediately  after the insertion of the  vein graft.  Circumflex in the AV groove had proximal 40% stenosis after  an obtuse marginal #1.  The obtuse marginal #1 was a small vessel and  normal.  OM2 was large, occluded at the ostium and filled via the vein  grafts.  Right coronary artery is a dominant vessel.  It was occluded in  the mid segment.  Grafts, LIMA to the LAD was known to be atretic and I  did not engage this vessel.  Saphenous vein graft to the right coronary  artery is widely patent.  Saphenous vein graft to diagonal was widely  patent, again 30% stenosis after the insertion of the vein graft.  Saphenous vein graft to the obtuse marginal was widely patent.  Left  ventriculogram, the left ventriculogram was obtained in the RAO  projection.  The EF was 60% with normal wall motion.  Aortogram, the  distal aortogram was obtained.  It did demonstrate a dissection plane  below the aortoiliac bifurcation.  There was  brisk flow in the true  lumen.   PLAN:  The patient will be observed overnight.  She will continue  medical management for her known coronary disease.  I did review the  results including the complication with the patient and the family.      Rollene Rotunda, MD, [facility]  Electronically Signed     JH/MEDQ  D:  08/30/2008  T:  08/31/2008  Job:  [id number]

## 2010-08-25 NOTE — H&P (Signed)
NAMETANESHIA, [name] [name]               ACCOUNT NO.:  [id number]   MEDICAL RECORD NO.:  [id number]          PATIENT TYPE:  INP   LOCATION:  3705                         FACILITY:  [facility]   PHYSICIAN:  Verne Carrow, MDDATE OF BIRTH:  1931/06/03   DATE OF ADMISSION:  08/30/2008  DATE OF DISCHARGE:                              HISTORY & PHYSICAL   PRIMARY CARDIOLOGIST:  Maisie Fus C. Daleen Squibb, MD, [facility]   PRIMARY CARE Nylene Inlow:  [name]. Swords, MD   PATIENT PROFILE:  A 77 year old married, Caucasian female with prior  history of CAD and MI, status post CABG, who presents with  lightheadedness and recurrent chest tightness.   PROBLEMS:  1. Unstable angina/CAD.      a.     Status post CABG x4 in 2004 with LIMA to the LAD, vein graft       to the first diagonal, vein graft to the obtuse marginal, vein       graft to the RCA.  This occurred following MI.      b.     December 01, 2007, cardiac catheterization revealing native 3-       vessel disease with atrophic LIMA to the LAD.  The other 3 grafts       were patent.  She had native coronary disease including moderate       obstruction in the LAD.      c.     August 2009, Myoview showed anterior ischemia.      d.     December 14, 2007, PCI and stenting of the LAD with       placement of a 2.75 x 25-mm Promus drug-eluting stent and 3.0 x 15-       mm Promus drug-eluting stent.  2. Hyperlipidemia.  3. Left bundle-branch block.  4. Postop AFib, previously on Coumadin, but no longer.  5. Status post hysterectomy in 1988.  6. History of cataract, status post surgery in 2005.  7. Hypertension.   HISTORY OF PRESENT ILLNESS:  A 77 year old married, Caucasian female  with the above problem list.  She has remained active without symptoms  or limitations, but has had a lot of life stress recently secondary to  her husband being in and out of the hospital with knee and more recently  arm problems.  He was admitted the other day and she has been  spending a  lot of time at [facility].  Today, she had parked in the parking lot and  was getting out of her car when she felt acutely lightheaded and dizzy.  She walked inside and felt like she might pass out.  She went to her  husband's room, where she stayed for a little while and then decided to  go get something to eat at the atrium here in the hospital.  Once she  got on the elevator, she says she became acutely confused and was not  sure how to get to the atrium, although this would normally be a routine  trip for her.  She eventually figured out, where she was and where she  needed  to be and then went back to her husband's room, where she  continued to feel lightheaded and told her family that she felt like  that she might pass out.  At that point, they got a wheelchair and  brought her down to the ED.  On the way down to the ED in the  wheelchair, she developed substernal chest tightness reminiscing the  previous angina.  Of note, lightheadedness was also one of her symptoms  prior to her PCI in September 2009.  Once in the ED, she was placed on  IV nitroglycerin with resolution of the discomfort.  Her first set of  cardiac markers are negative.  ECG shows chronic left bundle-branch  block without other acute changes.   ALLERGIES:  She reports a sensitivity to NITROGLYCERIN, although she  does otherwise tolerate it.   HOME MEDICATIONS:  1. Aspirin 81 mg daily.  2. Lopressor 25 mg b.i.d.  3. Plavix 75 mg daily.  4. Ramipril 5 mg daily.  5. Simvastatin 80 mg nightly.  6. Nitroglycerin sublingual p.r.n. chest pain.   FAMILY HISTORY:  Mother died of CVA at 92, father died of an MI at 78.  She has no siblings.   SOCIAL HISTORY:  She lives in [place] with her husband.  She is  retired.  She never smokes.  She has 2 drinks at night.  She denies drug  use.  She does not routinely exercise, but is fairly active at home.   REVIEW OF SYSTEMS:  Positive for chest tightness with  associated  clamminess, diaphoresis, as well as presyncope earlier today.  She has  had life stress and anxiety over the past couple of weeks related to her  husband's illness.  She has occasional constipation.  Otherwise, all  systems reviewed and negative.  She is a full code.   PHYSICAL EXAMINATION:  VITAL SIGNS:  Temperature 98.3, heart rate 62,  respirations 16, blood pressure 136/58, pulse ox 100% on room air.  GENERAL:  A pleasant, white female, in no acute distress.  Awake, alert,  and oriented x3.  HEENT:  Normal.  NEUROLOGIC:  Grossly intact and nonfocal.  SKIN:  Warm and dry without lesions or masses.  MUSCULOSKELETAL:  Grossly normal without deformity or effusion.  She has  a normal affect.  NECK:  Supple without bruits or JVD.  LUNGS:  Respirations are regular and unlabored.  Clear to auscultation.  CARDIAC:  Regular S1 and S2.  No S3, S4.  No murmurs.  ABDOMEN:  Round, soft, nontender, and nondistended.  Bowel sounds  present x4.  EXTREMITIES:  Warm, dry, and pink.  No clubbing, cyanosis, or edema.  Dorsalis pedis, posterior tibial pulses are 2+ and equal bilaterally.   Chest x-ray shows no active cardiopulmonary abnormalities with left base  scarring.  EKG shows sinus bradycardia at rate of 59, left bundle-branch  block, left axis deviation, no acute ST or T changes.   LABORATORY WORK:  Hemoglobin 12.6, hematocrit 35.8, WBC 12.2, platelets  100.  Sodium 138, potassium 3.5, chloride 102, CO2 of 27, BUN 10,  creatinine 28, glucose 92.  CK-MB 1.4, troponin I less than 0.05.  Urinalysis shows rare bacteria.   ASSESSMENT AND PLAN:  1. Unstable angina, symptoms concerning for angina as they are similar      to previous symptoms prior to myocardial infarction in 2004 as well      as PCI in September 2009.  We plan to admit and cycle cardiac  markers.  Continue on home meds.  Plan cath this afternoon.  2. Confusion.  She had an episode of confusion this morning.  We  will      check a head CT prior to going to the cath lab to ensure that she      has not had a CVA.  There are no current neurological deficits.  3. Hypertension, stable.  Check orthostatics given dizziness.  4. Hyperlipidemia.  Continue statin.  Check lipids and LFTs.  5. Thrombocytopenia.  Her platelets in September 2009 were 112,000 at      discharge.  She is currently 100,000.  We will also continue to      follow this closely.      Nicolasa Ducking, ANP      Verne Carrow, MD  Electronically Signed    CB/MEDQ  D:  08/30/2008  T:  08/31/2008  Job:  [id number]

## 2010-08-25 NOTE — Cardiovascular Report (Signed)
NAMESEERAT, [name] [name]               ACCOUNT NO.:  [id number]   MEDICAL RECORD NO.:  [id number]          PATIENT TYPE:  INP   LOCATION:  6523                         FACILITY:  [facility]   PHYSICIAN:  Everardo Beals. Juanda Chance, MD, FACCDATE OF BIRTH:  09-15-1931   DATE OF PROCEDURE:  12/14/2007  DATE OF DISCHARGE:                            CARDIAC CATHETERIZATION   CLINICAL HISTORY:  [name] [name] is 77 years old, and is the mother of [name] [name] and mother-in-law of [name] [name].  She had bypass surgery in 2004 in  [place], [place].  She developed exertional chest tightness and underwent  catheterization, which showed 70%-80% lesion in the proximal mid-LAD  with an occluded LIMA graft to the LAD.  The vein graft to diagonal  branch LAD, the vein graft to marginal branch of circumflex artery, and  the vein graft to the right coronary was patent and LV function was  good.  We were not sure the significance of the lesion.  We did a  Myoview scan and this suggested anterior ischemia, so she was brought  back today for intervention.  The lesion was heavily calcified.  We  planned rotational atherectomy.   PROCEDURE:  The procedure was performed with the right femoral arterial  sheath and a 7-French Q-3.5 guiding catheter with side holes.  The  patient had been loaded with Plavix and given 4 chewable aspirin.  Angiomax bolus infusion was given.  We passed a Rotafloppy wire down to  the LAD across the lesion without difficulty.  We used a 1.5 bur and  performed approximately 7 runs of approximately 150,000 rpms for 5-10  seconds each.  We then passed a [place] wire down the LAD and removed  the Rotafloppy wire.  We then dilated the lesion with a 2.5 x 20-mm apex  performing 2 inflations up to 10 atmospheres for 20 seconds.  We then  deployed a 2.75 x 25-mm Promus stent in the distal portion of the lesion  and we deployed inflating this with 1 inflation up to 11 atmospheres for  30 seconds.  We then deployed a  second Promus stent which was 3.0 x 15-  mm overlapping the first stent and proximal to the first stent, and  overlapping the septal perforator and diagonal branch.  We deployed this  with 1 inflation of 15 atmospheres for 30 seconds.  We then postdilated  both stents with a 3.25 x 12-mm [employer] performing 3 inflation of 18  atmospheres for 30 seconds.   Distal to the stent, there was a band in the vessel where the mammary  had connected.  We were very careful to stay away from the stent with  rotational atherectomy.  When we took the final pictures, we were  concerned there might be a lesion in the stent, but we finally took a  picture in RAO caudal, which laid out the vessel best and we felt that  this was not significant.  We debated whether the IVUS fits but thought  with the sharp bend the IVUS would be problematic.   The procedure was a long procedure,  but the patient tolerated the  procedure well and left the laboratory in stable condition.   RESULTS:  Initially, the stenosis in the proximal mid LAD was 80% and  was long and calcified.  Following stenting, this improved to 0%.   CONCLUSION:  Successful PCI of the lesion in the proximal mid-LAD using  rotational atherectomy, IVUS guidance, and 2 overlapping Promus drug-  eluting stents with improvement in percent of narrowing from 80% to 0%.   DISPOSITION:  The patient returned to [facility] room for further  observation.      Bruce Elvera Lennox Juanda Chance, MD, [facility]  Electronically Signed     BRB/MEDQ  D:  12/14/2007  T:  12/15/2007  Job:  [id number]   cc:   Thomas C. Wall, MD, FACC  Bruce H. Swords, MD  Cardiopulmonary Laboratory

## 2010-08-25 NOTE — Assessment & Plan Note (Signed)
[facility]                            CARDIOLOGY OFFICE NOTE   NAME:[name] [name], [name] [name]                        MRN:          [id number]  DATE:11/28/2007                            DOB:          21-Mar-1932    [name] [name] comes in today as an add-on.  She has been having  exertional chest tightness and shortness of breath since Sunday, 2 days  ago.  She was concerned at first, it might be the heat, but now she is  concerned, she may be having some angina.   HISTORY OF PRESENT ILLNESS:  She is a delightful 77 year old married  white female who just moved here from [place].  I saw her initially for  consultation and establishing with [place] as a cardiology team on November 02, 2007.  She is the mother-in-law of J. Alexandria Lodge, Pharm.D. here at [facility].   Cardiac history dates back to 2004, when she had an acute coronary  syndrome.  She had three-vessel disease, underwent bypass surgery with  left internal mammary graft to LAD, vein graft to diagonal 1, vein graft  to an obtuse marginal, and vein graft to right coronary artery.  Her  last stress Myoview was in March 2009.  EF was 55% by wall motion  analysis.  She has a left bundle chronically.  She had good exercise  tolerance.  There was no definite ischemia.  It appeared her EF had  improved from the past, because in the past, her echocardiogram showed  an EF of 40%.   She has been on Coumadin for a number of years because of LV dysfunction  and concerned about a clot.  She may have had some atrial fib, which  occurred about a month after bypass surgery.  She has not had any  recurrence at least symptomatically.   PAST MEDICAL HISTORY:  She has a history of hyperlipidemia and this is  followed by Dr. Birdie Sons.   She is very sensitive to nitroglycerin, but said she can use spray.  The  tablets dropped her pressure down.  She has no other drug allergies.  She has no dye allergy.   PAST SURGICAL HISTORY:  1.  Hysterectomy in 1988.  2. Abdominal prolapse repaired in 2006.  3. Cataract surgery in 2005.   CURRENT MEDICATIONS:  1. Altace 5 mg a day.  2. Metoprolol tartrate 50 mg a day.  3. Jantoven 2.5 mg a day.  4. Simvastatin 80 mg a day.  5. Enteric-coated aspirin 81 mg a day.   FAMILY HISTORY:  Her father died at age 29.  He was a heavy smoker.   SOCIAL HISTORY:  She is a retired Optometrist since 1989.  She just moved  back here to be closer to her children.  She has 3 children.  She has  never smoked.  She has a couple of alcoholic drinks a day.   REVIEW OF SYSTEMS:  Other than seasonal allergies, history of gout,  negative other than in the HPI.  She specifically denies orthopnea, PND,  peripheral edema, tachy palpitations, presyncope, or syncope.  PHYSICAL EXAMINATION:  GENERAL:  Today, she looks a bit anxious today.  She is in no acute distress.  VITAL SIGNS:  Her respiratory rate is 18.  Her blood pressure is 130/70.  Her pulse is 76 and regular.  Her weight is 143.  SKIN:  Warm and dry.  Skin shows some chronic arthritic changes.  Skin  shows also few ecchymoses.  HEENT:  Normocephalic, atraumatic.  PERRLA.  Extraocular movements  intact.  Sclerae are clear.  Facial symmetry is normal.  NECK:  Carotid  upstrokes are equal bilaterally without bruits.  No JVD.  Thyroid is not  enlarged.  Trachea is midline.  LUNGS:  Clear.  HEART:  Reveals a regular rate and rhythm.  No gallop.  ABDOMEN:  Soft.  Good bowel sounds.  No midline bruit.  No pulsatile  mass.  EXTREMITIES:  Revealed no cyanosis, clubbing or edema.  She has some  varicose veins.  Pulses are intact.  NEURO:  Intact.   Electrocardiogram shows sinus rhythm with left axis deviation and a  nonspecific intraventricular conduction delay consistent most with a  left bundle-branch block pattern.   There has been no acute changes.   ASSESSMENT AND PLAN:  1. Exertional angina and dyspnea on exertion, which is new.  Rule  out      obstructive coronary lesion or graft lesion.  2. History of coronary artery bypass grafting as noted and outlined      above.  3. Questioned degree of left ventricular dysfunction based on echo and      nuclear discrepancy.  4. Anticoagulation for what sounds like left ventricular dysfunction      and some postoperative fib, which is only transient.  5. Postoperative atrial fibrillation.  6. Hyperlipidemia.   Indications, risks, and benefits have been discussed.  We will set up  for an outpatient cath on Friday, 3 days after she stopped her Coumadin.  It will be good to now look at her coronaries and her grafts, but also  her LV function.     Thomas C. Daleen Squibb, MD, [facility]  Electronically Signed    TCW/MedQ  DD: 11/28/2007  DT: 11/29/2007  Job #: [id number]   cc:   [name]. Swords, MD

## 2010-08-25 NOTE — Procedures (Signed)
LOWER EXTREMITY VENOUS REFLUX EXAM   INDICATION:  Right leg varicosities.   EXAM:  Using color-flow imaging and pulse Doppler spectral analysis, the  right common femoral, superficial femoral, popliteal, posterior tibial,  greater and lesser saphenous veins are evaluated.  There is evidence  suggesting deep venous insufficiency in the right lower extremity.   The right saphenofemoral junction is not competent with reflux of  >581milliseconds. The right GSV is not competent with reflux of  >541milliseconds with the caliber as described below.   The right proximal short saphenous vein demonstrates incompetency.   GSV Diameter (used if found to be incompetent only)                                            Right    Left  Proximal Greater Saphenous Vein           0.41 cm  cm  Proximal-to-mid-thigh                     cm       cm  Mid thigh                                 0.31 cm  cm  Mid-distal thigh                          cm       cm  Distal thigh                              0.23 cm  cm  Knee                                      0.47 cm  cm   IMPRESSION:  1. The right greater saphenous vein is not competent with >500      milliseconds.  2. The right lateral anterior branch of the greater saphenous vein is      tortuous.  3. The deep venous system is not competent with reflux of      >561milliseconds.  4. The right lesser saphenous vein is not competent with reflux of      >568milliseconds.   ___________________________________________  Quita Skye Hart [place], M.D.   CB/MEDQ  D:  11/24/2009  T:  11/24/2009  Job:  [id number]

## 2010-09-08 ENCOUNTER — Ambulatory Visit (INDEPENDENT_AMBULATORY_CARE_PROVIDER_SITE_OTHER): Payer: Self-pay

## 2010-09-08 DIAGNOSIS — I83893 Varicose veins of bilateral lower extremities with other complications: Secondary | ICD-10-CM

## 2010-09-09 ENCOUNTER — Other Ambulatory Visit: Payer: Self-pay | Admitting: Internal Medicine

## 2010-09-09 DIAGNOSIS — Z1231 Encounter for screening mammogram for malignant neoplasm of breast: Secondary | ICD-10-CM

## 2010-10-05 ENCOUNTER — Other Ambulatory Visit: Payer: Self-pay | Admitting: *Deleted

## 2010-10-05 MED ORDER — RAMIPRIL 5 MG PO CAPS: 5.0000 mg | ORAL_CAPSULE | Freq: Every day | ORAL | Status: DC

## 2010-10-06 ENCOUNTER — Ambulatory Visit
Admission: RE | Admit: 2010-10-06 | Discharge: 2010-10-06 | Disposition: A | Discharge status: Home / Self Care | Payer: Medicare Other | Source: Ambulatory Visit | Attending: Internal Medicine | Admitting: Internal Medicine

## 2010-10-06 DIAGNOSIS — Z1231 Encounter for screening mammogram for malignant neoplasm of breast: Secondary | ICD-10-CM

## 2010-10-07 ENCOUNTER — Other Ambulatory Visit: Payer: Self-pay | Admitting: Internal Medicine

## 2010-10-07 DIAGNOSIS — R928 Other abnormal and inconclusive findings on diagnostic imaging of breast: Secondary | ICD-10-CM

## 2010-10-13 ENCOUNTER — Ambulatory Visit
Admission: RE | Admit: 2010-10-13 | Discharge: 2010-10-13 | Disposition: A | Discharge status: Home / Self Care | Payer: Medicare Other | Source: Ambulatory Visit | Attending: Internal Medicine | Admitting: Internal Medicine

## 2010-10-13 DIAGNOSIS — R928 Other abnormal and inconclusive findings on diagnostic imaging of breast: Secondary | ICD-10-CM

## 2010-10-30 ENCOUNTER — Other Ambulatory Visit: Payer: Self-pay | Admitting: Cardiology

## 2010-11-10 ENCOUNTER — Other Ambulatory Visit (INDEPENDENT_AMBULATORY_CARE_PROVIDER_SITE_OTHER): Payer: Medicare Other

## 2010-11-10 DIAGNOSIS — I10 Essential (primary) hypertension: Secondary | ICD-10-CM

## 2010-11-10 DIAGNOSIS — E785 Hyperlipidemia, unspecified: Secondary | ICD-10-CM

## 2010-11-10 DIAGNOSIS — I2510 Atherosclerotic heart disease of native coronary artery without angina pectoris: Secondary | ICD-10-CM

## 2010-11-10 LAB — LIPID PANEL
Cholesterol: 155 mg/dL (ref 0–200)
HDL: 68.8 mg/dL (ref >39.00)
Triglycerides: 76 mg/dL (ref 0.0–149.0)
VLDL: 15.2 mg/dL (ref 0.0–40.0)

## 2010-11-10 LAB — HEPATIC FUNCTION PANEL
Bilirubin, Direct: 0 mg/dL (ref 0.0–0.3)
Total Bilirubin: 0.8 mg/dL (ref 0.3–1.2)

## 2010-11-10 LAB — BASIC METABOLIC PANEL
BUN: 11 mg/dL (ref 6–23)
Calcium: 8.7 mg/dL (ref 8.4–10.5)
Creatinine, Ser: 0.8 mg/dL (ref 0.4–1.2)
GFR: 79.63 mL/min (ref >60.00)
Glucose, Bld: 102 mg/dL — ABNORMAL HIGH (ref 70–99)
Potassium: 4.1 mEq/L (ref 3.5–5.1)

## 2010-11-10 LAB — CBC WITH DIFFERENTIAL/PLATELET
Eosinophils Absolute: 0.1 10*3/uL (ref 0.0–0.7)
HCT: 41.7 % (ref 36.0–46.0)
Lymphs Abs: 1.2 10*3/uL (ref 0.7–4.0)
MCHC: 32.8 g/dL (ref 30.0–36.0)
MCV: 93.2 fl (ref 78.0–100.0)
Monocytes Absolute: 0.4 10*3/uL (ref 0.1–1.0)
Neutrophils Relative %: 66 % (ref 43.0–77.0)
Platelets: 101 10*3/uL — ABNORMAL LOW (ref 150.0–400.0)
RDW: 14.3 % (ref 11.5–14.6)

## 2010-11-17 ENCOUNTER — Other Ambulatory Visit: Payer: MEDICARE

## 2010-11-24 ENCOUNTER — Ambulatory Visit (INDEPENDENT_AMBULATORY_CARE_PROVIDER_SITE_OTHER): Payer: Medicare Other | Admitting: Internal Medicine

## 2010-11-24 ENCOUNTER — Encounter: Payer: Self-pay | Admitting: Internal Medicine

## 2010-11-24 VITALS — BP 134/76 | HR 80 | Temp 98.0°F | Ht 67.5 in | Wt 139.0 lb

## 2010-11-24 DIAGNOSIS — E785 Hyperlipidemia, unspecified: Secondary | ICD-10-CM

## 2010-11-24 DIAGNOSIS — I2510 Atherosclerotic heart disease of native coronary artery without angina pectoris: Secondary | ICD-10-CM

## 2010-11-24 DIAGNOSIS — I10 Essential (primary) hypertension: Secondary | ICD-10-CM

## 2010-11-24 MED ORDER — ATORVASTATIN CALCIUM 20 MG PO TABS: 20.0000 mg | ORAL_TABLET | Freq: Every day | ORAL | Status: DC

## 2010-11-24 NOTE — Assessment & Plan Note (Signed)
Note high dose simvastatin Will change to atorvastatin

## 2010-11-24 NOTE — Progress Notes (Signed)
  Subjective:    Patient ID: [name] [name], female    DOB: [birth year], 77 y.o.   MRN: [id number]  HPI Patient Active Problem List  Diagnoses  . MELANOMA---has regular f/u with dermatology  . HYPERLIPIDEMIA---tolerating statins but she wonders about associated short term memory loss  . HYPERTENSION---tolerating meds  .   Marland Kitchen CORONARY ARTERY DISEASE---no sxs but she does note that whe becomes breathless after climbing 120 steps ([place] this at the beach)  . LBBB  . ATRIAL FIBRILLATION---no recurrence  .   .   .     Past Medical History  Diagnosis Date  . MELANOMA 09/06/2008  . HYPERLIPIDEMIA 10/06/2007  . HYPERTENSION 10/06/2007  . MYOCARDIAL INFARCTION, HX OF 10/06/2007  . CORONARY ARTERY DISEASE 10/06/2007  . LBBB 09/06/2008  . Atrial fibrillation 10/06/2007  . VARICOSE VEIN 09/29/2009  . UTERINE CANCER, HX OF 09/06/2008  . PERSONAL HX COLONIC POLYPS 10/30/2009   Past Surgical History  Procedure Date  . Oophorectomy   . Right distal pretibeal     melanoma  . Coronary angioplasty with stent placement 2009  . Coronary artery bypass graft 2004  . Abdominal hysterectomy 1988    reports that she has never smoked. She does not have any smokeless tobacco history on file. She reports that she drinks alcohol. Her drug history not on file. family history includes Heart attack in her father and Stroke in her mother.  There is no history of Colon cancer. Allergies  Allergen Reactions  . Nitroglycerin     REACTION: sensitive too so uses spray     Review of Systems  patient denies chest pain, shortness of breath, orthopnea. Denies lower extremity edema, abdominal pain, change in appetite, change in bowel movements. Patient denies rashes, musculoskeletal complaints. No other specific complaints in a complete review of systems.      Objective:   Physical Exam  Well-developed well-nourished female in no acute distress. HEENT exam atraumatic, normocephalic, extraocular muscles are intact.  Neck is supple. No jugular venous distention no thyromegaly. Chest clear to auscultation without increased work of breathing. Cardiac exam S1 and S2 are regular. Abdominal exam active bowel sounds, soft, nontender. Extremities no edema.     Assessment & Plan:

## 2010-12-01 NOTE — Assessment & Plan Note (Signed)
Patient has no symptoms. We'll continue with risk factor modification.

## 2010-12-01 NOTE — Assessment & Plan Note (Signed)
BP Readings from Last 3 Encounters:  11/24/10 134/76  05/20/10 132/74  01/26/10 120/62   Fair control. Continue current medications.

## 2010-12-22 ENCOUNTER — Encounter: Payer: Self-pay | Admitting: *Deleted

## 2010-12-23 ENCOUNTER — Ambulatory Visit (INDEPENDENT_AMBULATORY_CARE_PROVIDER_SITE_OTHER): Payer: Medicare Other | Admitting: Cardiology

## 2010-12-23 ENCOUNTER — Encounter: Payer: Self-pay | Admitting: Cardiology

## 2010-12-23 VITALS — BP 132/73 | HR 70 | Resp 14 | Ht 68.0 in | Wt 139.0 lb

## 2010-12-23 DIAGNOSIS — I2510 Atherosclerotic heart disease of native coronary artery without angina pectoris: Secondary | ICD-10-CM

## 2010-12-23 DIAGNOSIS — I252 Old myocardial infarction: Secondary | ICD-10-CM

## 2010-12-23 DIAGNOSIS — I447 Left bundle-branch block, unspecified: Secondary | ICD-10-CM

## 2010-12-23 DIAGNOSIS — I4891 Unspecified atrial fibrillation: Secondary | ICD-10-CM

## 2010-12-23 MED ORDER — NITROGLYCERIN 0.4 MG/SPRAY TL SOLN: 1.0000 | Status: DC | PRN

## 2010-12-23 NOTE — Assessment & Plan Note (Signed)
Stable.  No change in treatment. Nitroglycerin spray refilled.

## 2010-12-23 NOTE — Patient Instructions (Signed)
Your physician recommends that you schedule a follow-up appointment in: 1 year with Dr. Wall  

## 2010-12-23 NOTE — Progress Notes (Signed)
HPI [name] [name] comes in today for evaluation and management of her coronary disease, history of MI, and left bundle branch block. She's also had a history of atrial fib which was postoperative after prior bypass grafting which has not recurred.  On vacation recently, after climbing 90 steps she became short of breath. She was  carrying groceries as well. With that, she had some chest tightness. She did not have to use nitroglycerin.  She is not at rest discomfort. She remains extremely active in water aerobics and yard work.  She denies palpitations, presyncope, orthopnea, PND or edema. She is compliant with her medications.  EKG shows normal sinus rhythm with a left bundle branch block, stable. Past Medical History  Diagnosis Date  . MELANOMA 09/06/2008  . HYPERLIPIDEMIA 10/06/2007  . HYPERTENSION 10/06/2007  . MYOCARDIAL INFARCTION, HX OF 10/06/2007  . CORONARY ARTERY DISEASE 10/06/2007  . LBBB 09/06/2008  . Atrial fibrillation 10/06/2007  . VARICOSE VEIN 09/29/2009  . UTERINE CANCER, HX OF 09/06/2008  . PERSONAL HX COLONIC POLYPS 10/30/2009    Past Surgical History  Procedure Date  . Oophorectomy   . Right distal pretibeal     melanoma  . Coronary angioplasty with stent placement 2009  . Coronary artery bypass graft 2004  . Abdominal hysterectomy 1988  . Cardiac catheterization 08/30/2008    Family History  Problem Relation Age of Onset  . Stroke Mother   . Heart attack Father   . Colon cancer Neg Hx     History   Social History  . Marital Status: Widowed    Spouse Name: N/A    Number of Children: N/A  . Years of Education: N/A   Occupational History  . Not on file.   Social History Main Topics  . Smoking status: Never Smoker   . Smokeless tobacco: Not on file  . Alcohol Use: Yes  . Drug Use:   . Sexually Active:    Other Topics Concern  . Not on file   Social History Narrative  . No narrative on file    Allergies  Allergen Reactions  . Nitroglycerin    REACTION: sensitive too so uses spray    Current Outpatient Prescriptions  Medication Sig Dispense Refill  . atorvastatin (LIPITOR) 20 MG tablet Take 1 tablet (20 mg total) by mouth daily.  90 tablet  3  . DiphenhydrAMINE HCl, Sleep, ([facility] SLEEP AID PO) Take 50 mg by mouth at bedtime. 2 tabs       . METOPROLOL TARTRATE PO 1/2 tab po qd      . nitroGLYCERIN (NITROLINGUAL) 0.4 MG/SPRAY spray Place 1 spray under the tongue every 5 (five) minutes as needed.        Marland Kitchen PLAVIX 75 MG tablet TAKE 1 TABLET BY MOUTH DAILY  90 tablet  2  . ramipril (ALTACE) 5 MG capsule Take 5 mg by mouth daily.        . simvastatin (ZOCOR) 80 MG tablet Take 80 mg by mouth at bedtime.        . terbinafine (LAMISIL) 250 MG tablet Take 250 mg by mouth daily.          ROS Negative other than HPI.   PE General Appearance: well developed, well nourished in no acute distress HEENT: symmetrical face, PERRLA, good dentition  Neck: no JVD, thyromegaly, or adenopathy, trachea midline Chest: symmetric without deformity Cardiac: PMI non-displaced, RRR, normal S1, S2, no gallop or murmur Lung: clear to ausculation and percussion Vascular: all pulses  full without bruits  Abdominal: nondistended, nontender, good bowel sounds, no HSM, no bruits Extremities: no cyanosis, clubbing or edema, no sign of DVT, no varicosities  Skin: normal color, no rashes Neuro: alert and oriented x 3, non-focal Pysch: normal affect Filed Vitals:   12/23/10 1554  BP: 132/73  Pulse: 70  Resp: 14  Height: 5\' 8"  (1.727 m)  Weight: 139 lb (63.05 kg)    EKG  Labs and Studies Reviewed.   Lab Results  Component Value Date   WBC 5.1 11/10/2010   HGB 13.7 11/10/2010   HCT 41.7 11/10/2010   MCV 93.2 11/10/2010   PLT 101.0* 11/10/2010      Chemistry      Component Value Date/Time   NA 138 11/10/2010 0831   K 4.1 11/10/2010 0831   CL 100 11/10/2010 0831   CO2 29 11/10/2010 0831   BUN 11 11/10/2010 0831   CREATININE 0.8 11/10/2010 0831        Component Value Date/Time   CALCIUM 8.7 11/10/2010 0831   ALKPHOS 58 11/10/2010 0831   AST 26 11/10/2010 0831   ALT 17 11/10/2010 0831   BILITOT 0.8 11/10/2010 0831       Lab Results  Component Value Date   CHOL 155 11/10/2010   CHOL 150 05/13/2010   CHOL 151 10/17/2009   Lab Results  Component Value Date   HDL 68.80 11/10/2010   HDL 11.91 05/13/2010   HDL 56.70 10/17/2009   Lab Results  Component Value Date   LDLCALC 71 11/10/2010   LDLCALC 79 05/13/2010   LDLCALC 80 10/17/2009   Lab Results  Component Value Date   TRIG 76.0 11/10/2010   TRIG 65.0 05/13/2010   TRIG 73.0 10/17/2009   Lab Results  Component Value Date   CHOLHDL 2 11/10/2010   CHOLHDL 3 05/13/2010   CHOLHDL 3 10/17/2009   No results found for this basename: HGBA1C   Lab Results  Component Value Date   ALT 17 11/10/2010   AST 26 11/10/2010   ALKPHOS 58 11/10/2010   BILITOT 0.8 11/10/2010   Lab Results  Component Value Date   TSH 0.92 12/26/2008

## 2011-01-07 LAB — POCT I-STAT, CHEM 8
Creatinine, Ser: 1
Hemoglobin: 12.9
Potassium: 3.8
Sodium: 139

## 2011-01-07 LAB — DIFFERENTIAL
Lymphocytes Relative: 22
Lymphs Abs: 1.2
Monocytes Relative: 8
Neutro Abs: 3.7
Neutrophils Relative %: 68

## 2011-01-07 LAB — CBC
MCV: 92.4
RBC: 4.24
WBC: 5.5

## 2011-01-07 LAB — POCT CARDIAC MARKERS
CKMB, poc: 1.4
Operator id: 161631
Troponin i, poc: <0.05

## 2011-01-07 LAB — URINALYSIS, ROUTINE W REFLEX MICROSCOPIC
Hgb urine dipstick: NEGATIVE
Nitrite: NEGATIVE
Specific Gravity, Urine: 1.005
Urobilinogen, UA: 0.2

## 2011-01-07 LAB — PROTIME-INR: INR: 2 — ABNORMAL HIGH

## 2011-01-13 ENCOUNTER — Other Ambulatory Visit: Payer: Self-pay | Admitting: Internal Medicine

## 2011-01-13 LAB — CBC
HCT: 34 — ABNORMAL LOW
Hemoglobin: 11.7 — ABNORMAL LOW
MCV: 91.7
Platelets: 112 — ABNORMAL LOW
RDW: 13.6
WBC: 4.8

## 2011-01-13 LAB — BASIC METABOLIC PANEL
BUN: 11
Chloride: 105
Glucose, Bld: 97
Potassium: 3.9
Sodium: 138

## 2011-01-13 NOTE — Telephone Encounter (Signed)
Pt would like a refill on anucort-hc 25mg  rectal suppositories. [facility] [phone]

## 2011-01-15 NOTE — Telephone Encounter (Signed)
Not on her med list.

## 2011-01-17 MED ORDER — HYDROCORTISONE ACETATE 25 MG RE SUPP: 25.0000 mg | Freq: Two times a day (BID) | RECTAL | Status: DC | PRN

## 2011-01-17 NOTE — Telephone Encounter (Signed)
Call patient. Tell her that medications have been called in.

## 2011-01-18 NOTE — Telephone Encounter (Signed)
Pt aware.

## 2011-02-02 ENCOUNTER — Other Ambulatory Visit: Payer: Self-pay | Admitting: Cardiology

## 2011-02-02 ENCOUNTER — Other Ambulatory Visit: Payer: Self-pay | Admitting: Internal Medicine

## 2011-02-24 ENCOUNTER — Other Ambulatory Visit (INDEPENDENT_AMBULATORY_CARE_PROVIDER_SITE_OTHER): Payer: Medicare Other

## 2011-02-24 DIAGNOSIS — E785 Hyperlipidemia, unspecified: Secondary | ICD-10-CM

## 2011-02-24 LAB — HEPATIC FUNCTION PANEL
ALT: 13 U/L (ref 0–35)
Bilirubin, Direct: 0 mg/dL (ref 0.0–0.3)
Total Bilirubin: 0.6 mg/dL (ref 0.3–1.2)

## 2011-02-24 LAB — LIPID PANEL
Total CHOL/HDL Ratio: 3
VLDL: 33.8 mg/dL (ref 0.0–40.0)

## 2011-04-20 ENCOUNTER — Encounter: Payer: Self-pay | Admitting: Family

## 2011-04-20 ENCOUNTER — Ambulatory Visit (INDEPENDENT_AMBULATORY_CARE_PROVIDER_SITE_OTHER): Payer: Medicare Other | Admitting: Family

## 2011-04-20 DIAGNOSIS — IMO0001 Reserved for inherently not codable concepts without codable children: Secondary | ICD-10-CM

## 2011-04-20 DIAGNOSIS — M79601 Pain in right arm: Secondary | ICD-10-CM

## 2011-04-20 DIAGNOSIS — W5501XA Bitten by cat, initial encounter: Secondary | ICD-10-CM

## 2011-04-20 DIAGNOSIS — M79609 Pain in unspecified limb: Secondary | ICD-10-CM

## 2011-04-20 MED ORDER — MUPIROCIN 2 % EX OINT: TOPICAL_OINTMENT | Freq: Three times a day (TID) | CUTANEOUS | Status: DC

## 2011-04-20 MED ORDER — AMOXICILLIN-POT CLAVULANATE 875-125 MG PO TABS: 1.0000 | ORAL_TABLET | Freq: Two times a day (BID) | ORAL | Status: AC

## 2011-04-20 NOTE — Patient Instructions (Signed)
Animal Bite An animal bite can result in a scratch on the skin, deep open cut, puncture of the skin, crush injury, or tearing away of the skin or a body part. Dogs are responsible for most animal bites. Children are bitten more often than adults. An animal bite can range from very mild to more serious. A small bite from your house pet is no cause for alarm. However, some animal bites can become infected or injure a bone or other tissue. You must seek medical care if:  The skin is broken and bleeding does not slow down or stop after 15 minutes.   The puncture is deep and difficult to clean (such as a cat bite).   Pain, warmth, redness, or pus develops around the wound.   The bite is from a stray animal or rodent. There may be a risk of rabies infection.   The bite is from a snake, raccoon, skunk, fox, coyote, or bat. There may be a risk of rabies infection.   The person bitten has a chronic illness such as diabetes, liver disease, or cancer, or the person takes medicine that lowers the immune system.   There is concern about the location and severity of the bite.  It is important to clean and protect an animal bite wound right away to prevent infection. Follow these steps:  Clean the wound with plenty of water and soap.   Apply an antibiotic cream.   Apply gentle pressure over the wound with a clean towel or gauze to slow or stop bleeding.   Elevate the affected area above the heart to help stop any bleeding.   Seek medical care. Getting medical care within 8 hours of the animal bite leads to the best possible outcome.  DIAGNOSIS  Your caregiver will most likely:  Take a detailed history of the animal and the bite injury.   Perform a wound exam.   Take your medical history.  Blood tests or X-rays may be performed. Sometimes, infected bite wounds are cultured and sent to a lab to identify the infectious bacteria.  TREATMENT  Medical treatment will depend on the location and type of  animal bite as well as the patient's medical history. Treatment may include:  Wound care, such as cleaning and flushing the wound with saline solution, bandaging, and elevating the affected area.   Antibiotics.   Tetanus immunization.   Rabies immunization.   Leaving the wound open to heal. This is often done with animal bites, due to the high risk of infection. However, in certain cases, wound closure with stitches, wound adhesive, skin adhesive strips, or staples may be used.  Infected bites that are left untreated may require intravenous (IV) antibiotics and surgical treatment in the hospital. HOME CARE INSTRUCTIONS  Follow your caregiver's instructions for wound care.   Take all medicines as directed.   If your caregiver prescribes antibiotics, take them as directed. Finish them even if you start to feel better.   Follow up with your caregiver for further exams or immunizations as directed.  You may need a tetanus shot if:  You cannot remember when you had your last tetanus shot.   You have never had a tetanus shot.   The injury broke your skin.  If you get a tetanus shot, your arm may swell, get red, and feel warm to the touch. This is common and not a problem. If you need a tetanus shot and you choose not to have one, there is a   rare chance of getting tetanus. Sickness from tetanus can be serious. SEEK MEDICAL CARE IF:  You notice warmth, redness, soreness, swelling, pus discharge, or a bad smell coming from the wound.   You have a red line on the skin coming from the wound.   You have a fever, chills, or a general ill feeling.   You have nausea or vomiting.   You have continued or worsening pain.   You have trouble moving the injured part.   You have other questions or concerns.  MAKE SURE YOU:  Understand these instructions.   Will watch your condition.   Will get help right away if you are not doing well or get worse.  Document Released: 12/15/2010  Document Reviewed: 11/18/2010 [employer] Patient Information 2012 [employer]. 

## 2011-04-20 NOTE — Progress Notes (Signed)
Subjective:    Patient ID: [name] [name], female    DOB: 1931-09-21, 78 y.o.   MRN: [id number]  HPI 78 year old white female, patient of Dr. Cato Mulligan is in today in for follow-up of an animal bite. She was seen at urgent here yesterday after being bitten by her personal cat. The attack with unprovoked. She is currently on a Z-Pak but is seeing more redness and streaking up her right arm. Tetanus up to date. Pets immunizations up to date, including rabies.    Review of Systems  Constitutional: Negative.   Respiratory: Negative.   Cardiovascular: Negative.   Skin: Positive for wound.       Cat bite to the right arm.   Neurological: Negative.   Hematological: Negative.    Past Medical History  Diagnosis Date  . MELANOMA 09/06/2008  . HYPERLIPIDEMIA 10/06/2007  . HYPERTENSION 10/06/2007  . MYOCARDIAL INFARCTION, HX OF 10/06/2007  . CORONARY ARTERY DISEASE 10/06/2007  . LBBB 09/06/2008  . Atrial fibrillation 10/06/2007  . VARICOSE VEIN 09/29/2009  . UTERINE CANCER, HX OF 09/06/2008  . PERSONAL HX COLONIC POLYPS 10/30/2009    History   Social History  . Marital Status: Widowed    Spouse Name: N/A    Number of Children: N/A  . Years of Education: N/A   Occupational History  . Not on file.   Social History Main Topics  . Smoking status: Never Smoker   . Smokeless tobacco: Not on file  . Alcohol Use: Yes  . Drug Use:   . Sexually Active:    Other Topics Concern  . Not on file   Social History Narrative  . No narrative on file    Past Surgical History  Procedure Date  . Oophorectomy   . Right distal pretibeal     melanoma  . Coronary angioplasty with stent placement 2009  . Coronary artery bypass graft 2004  . Abdominal hysterectomy 1988  . Cardiac catheterization 08/30/2008    Family History  Problem Relation Age of Onset  . Stroke Mother   . Heart attack Father   . Colon cancer Neg Hx     Allergies  Allergen Reactions  . Nitroglycerin     REACTION: sensitive  too so uses spray    Current Outpatient Prescriptions on File Prior to Visit  Medication Sig Dispense Refill  . atorvastatin (LIPITOR) 20 MG tablet Take 1 tablet (20 mg total) by mouth daily.  90 tablet  3  . clopidogrel (PLAVIX) 75 MG tablet TAKE 1 TABLET BY MOUTH DAILY  90 tablet  3  . DiphenhydrAMINE HCl, Sleep, ([facility] SLEEP AID PO) Take 50 mg by mouth at bedtime. 2 tabs       . hydrocortisone (ANUSOL-HC) 25 MG suppository Place 1 suppository (25 mg total) rectally 2 (two) times daily as needed for hemorrhoids.  12 suppository  1  . metoprolol (LOPRESSOR) 50 MG tablet TAKE  1/2 TABLET BY MOUTH EVERY DAY  30 tablet  5  . METOPROLOL TARTRATE PO 1/2 tab po qd      . nitroGLYCERIN (NITROLINGUAL) 0.4 MG/SPRAY spray Place 1 spray under the tongue every 5 (five) minutes as needed.  12 g  8  . ramipril (ALTACE) 5 MG capsule Take 5 mg by mouth daily.        . ramipril (ALTACE) 5 MG capsule TAKE ONE CAPSULE BY MOUTH EVERY DAY  30 capsule  4  . simvastatin (ZOCOR) 80 MG tablet Take 80 mg by mouth at bedtime.        Marland Kitchen  terbinafine (LAMISIL) 250 MG tablet Take 250 mg by mouth daily.          BP 132/80  Temp(Src) 98.4 F (36.9 C) (Oral)  Wt 142 lb (64.411 kg)chart    Objective:   Physical Exam  Constitutional: She is oriented to person, place, and time.  Neck: Normal range of motion. Neck supple.  Cardiovascular: Normal rate, regular rhythm and normal heart sounds.   Pulmonary/Chest: Effort normal and breath sounds normal.  Neurological: She is alert and oriented to person, place, and time.  Skin: Skin is warm and dry.       Cat bite noted to the right arm. 3cm laceration noted to the right forearm, red, and tender to touch with streaking.   Psychiatric: She has a normal mood and affect.          Assessment & Plan:  Assessment:  Cat bite  Plan: D/C Zpak. Start Augmentin 875mg  twice a day x 10 days. Recheck in 3 days. Bactroban ointment applied to the AA three times a day. Patient to call  the office if symptoms worsen or persist. Literature given on animal bites.

## 2011-04-23 ENCOUNTER — Ambulatory Visit (INDEPENDENT_AMBULATORY_CARE_PROVIDER_SITE_OTHER): Payer: Medicare Other | Admitting: Internal Medicine

## 2011-04-23 ENCOUNTER — Encounter: Payer: Self-pay | Admitting: Internal Medicine

## 2011-04-23 DIAGNOSIS — I10 Essential (primary) hypertension: Secondary | ICD-10-CM

## 2011-04-23 DIAGNOSIS — IMO0001 Reserved for inherently not codable concepts without codable children: Secondary | ICD-10-CM

## 2011-04-23 DIAGNOSIS — W5501XA Bitten by cat, initial encounter: Secondary | ICD-10-CM

## 2011-04-23 NOTE — Patient Instructions (Signed)
Take your antibiotic as prescribed until ALL of it is gone, but stop if you develop a rash, swelling, or any side effects of the medication.  Contact our office as soon as possible if  there are side effects of the medication.  Call if  You  Develop  fever worsening pain involving the wound or any drainage or worsening redness

## 2011-04-23 NOTE — Progress Notes (Signed)
  Subjective:    Patient ID: [name] [name], female    DOB: 1931/05/12, 78 y.o.   MRN: [id number]  HPI  78 year old patient who is seen today in followup after sustaining a cat bite earlier in the week. She was bit by her own cat who has not been ill and has been up to date on all vaccinations. She presently is on Augmentin therapy and continues to do well. There has been no worsening pain drainage or swelling of her wound and she has had no fever or constitutional symptoms. She is tolerating her antibiotic therapy well and is also using topical antibiotic ointment    Review of Systems  Skin: Positive for wound.       Objective:   Physical Exam  Skin:       The patient's wound involving her right lower arm examined. Her largest lesion was a laceration approximately 6 cm in length. It was superficial and granulating in quite well. There is only minimal surrounding erythema and no signs of overt infection no drainage or fluctuants          Assessment & Plan:    Cat bite right arm. Improved. The patient will complete all antibiotic therapy local wound care discussed she will call she develops fever worsening pain or drainage

## 2011-05-27 ENCOUNTER — Ambulatory Visit: Payer: Medicare Other | Admitting: Internal Medicine

## 2011-06-07 ENCOUNTER — Ambulatory Visit (INDEPENDENT_AMBULATORY_CARE_PROVIDER_SITE_OTHER): Payer: Medicare Other | Admitting: Internal Medicine

## 2011-06-07 ENCOUNTER — Encounter: Payer: Self-pay | Admitting: Internal Medicine

## 2011-06-07 DIAGNOSIS — I10 Essential (primary) hypertension: Secondary | ICD-10-CM

## 2011-06-07 DIAGNOSIS — I4891 Unspecified atrial fibrillation: Secondary | ICD-10-CM

## 2011-06-07 DIAGNOSIS — I2510 Atherosclerotic heart disease of native coronary artery without angina pectoris: Secondary | ICD-10-CM

## 2011-06-07 NOTE — Assessment & Plan Note (Signed)
No recurrence. 

## 2011-06-07 NOTE — Progress Notes (Signed)
Patient ID: [name] [name], female   DOB: 1931/09/11, 78 y.o.   MRN: [id number] Patient Active Problem List  Diagnoses  . [facility] dermatology  . HYPERLIPIDEMIA---tolerating meds  . HYPERTENSION--no home BPs , tolerating meds  .   Marland Kitchen CORONARY ARTERY DISEASE--no sxs, she is still quite active  .   .   .   . UTERINE CANCER, HX OF-no known recurrence   Past Medical History  Diagnosis Date  . MELANOMA 09/06/2008  . HYPERLIPIDEMIA 10/06/2007  . HYPERTENSION 10/06/2007  . MYOCARDIAL INFARCTION, HX OF 10/06/2007  . CORONARY ARTERY DISEASE 10/06/2007  . LBBB 09/06/2008  . Atrial fibrillation 10/06/2007  . VARICOSE VEIN 09/29/2009  . UTERINE CANCER, HX OF 09/06/2008  . PERSONAL HX COLONIC POLYPS 10/30/2009    History   Social History  . Marital Status: Widowed    Spouse Name: N/A    Number of Children: N/A  . Years of Education: N/A   Occupational History  . Not on file.   Social History Main Topics  . Smoking status: Never Smoker   . Smokeless tobacco: Never Used  . Alcohol Use: Yes  . Drug Use: Not on file  . Sexually Active: Not on file   Other Topics Concern  . Not on file   Social History Narrative  . No narrative on file    Past Surgical History  Procedure Date  . Oophorectomy   . Right distal pretibeal     melanoma  . Coronary angioplasty with stent placement 2009  . Coronary artery bypass graft 2004  . Abdominal hysterectomy 1988  . Cardiac catheterization 08/30/2008    Family History  Problem Relation Age of Onset  . Stroke Mother   . Heart attack Father   . Colon cancer Neg Hx     Allergies  Allergen Reactions  . Nitroglycerin     REACTION: sensitive too so uses spray    Current Outpatient Prescriptions on File Prior to Visit  Medication Sig Dispense Refill  . atorvastatin (LIPITOR) 20 MG tablet Take 1 tablet (20 mg total) by mouth daily.  90 tablet  3  . clopidogrel (PLAVIX) 75 MG tablet TAKE 1 TABLET BY MOUTH DAILY  90 tablet  3  .  hydrocortisone (ANUSOL-HC) 25 MG suppository Place 1 suppository (25 mg total) rectally 2 (two) times daily as needed for hemorrhoids.  12 suppository  1  . metoprolol (LOPRESSOR) 50 MG tablet TAKE  1/2 TABLET BY MOUTH EVERY DAY  30 tablet  5  . nitroGLYCERIN (NITROLINGUAL) 0.4 MG/SPRAY spray Place 1 spray under the tongue every 5 (five) minutes as needed.  12 g  8  . ramipril (ALTACE) 5 MG capsule TAKE ONE CAPSULE BY MOUTH EVERY DAY  30 capsule  4     patient denies chest pain, shortness of breath, orthopnea. Denies lower extremity edema, abdominal pain, change in appetite, change in bowel movements. Patient denies rashes, musculoskeletal complaints. No other specific complaints in a complete review of systems.   BP 112/70  Pulse 72  Temp(Src) 98.4 F (36.9 C) (Oral)  Wt 144 lb (65.318 kg)  Well-developed well-nourished female in no acute distress. HEENT exam atraumatic, normocephalic, extraocular muscles are intact. Neck is supple. No jugular venous distention no thyromegaly. Chest clear to auscultation without increased work of breathing. Cardiac exam S1 and S2 are regular. Abdominal exam active bowel sounds, soft, nontender. Extremities no edema.

## 2011-06-07 NOTE — Assessment & Plan Note (Signed)
No sxs Will continue risk factor modification 

## 2011-06-07 NOTE — Assessment & Plan Note (Signed)
Well controlled. Continue current meds

## 2011-06-15 ENCOUNTER — Ambulatory Visit (INDEPENDENT_AMBULATORY_CARE_PROVIDER_SITE_OTHER): Payer: Medicare Other | Admitting: Family

## 2011-06-15 ENCOUNTER — Encounter: Payer: Self-pay | Admitting: Family

## 2011-06-15 ENCOUNTER — Telehealth: Payer: Self-pay | Admitting: Family Medicine

## 2011-06-15 VITALS — BP 118/70 | Temp 98.5°F | Ht 67.5 in | Wt 145.0 lb

## 2011-06-15 DIAGNOSIS — I10 Essential (primary) hypertension: Secondary | ICD-10-CM

## 2011-06-15 DIAGNOSIS — R0982 Postnasal drip: Secondary | ICD-10-CM

## 2011-06-15 DIAGNOSIS — R05 Cough: Secondary | ICD-10-CM

## 2011-06-15 MED ORDER — HYDROCOD POLST-CHLORPHEN POLST 10-8 MG/5ML PO LQCR: 5.0000 mL | Freq: Two times a day (BID) | ORAL | Status: DC | PRN

## 2011-06-15 NOTE — Patient Instructions (Signed)
1. Zyrtec once daily. Tussionex cough med twice daily.   Upper Respiratory Infection, Adult An upper respiratory infection (URI) is also sometimes known as the common cold. The upper respiratory tract includes the nose, sinuses, throat, trachea, and bronchi. Bronchi are the airways leading to the lungs. Most people improve within 1 week, but symptoms can last up to 2 weeks. A residual cough may last even longer.  CAUSES Many different viruses can infect the tissues lining the upper respiratory tract. The tissues become irritated and inflamed and often become very moist. Mucus production is also common. A cold is contagious. You can easily spread the virus to others by oral contact. This includes kissing, sharing a glass, coughing, or sneezing. Touching your mouth or nose and then touching a surface, which is then touched by another person, can also spread the virus. SYMPTOMS  Symptoms typically develop 1 to 3 days after you come in contact with a cold virus. Symptoms vary from person to person. They may include:  Runny nose.   Sneezing.   Nasal congestion.   Sinus irritation.   Sore throat.   Loss of voice (laryngitis).   Cough.   Fatigue.   Muscle aches.   Loss of appetite.   Headache.   Low-grade fever.  DIAGNOSIS  You might diagnose your own cold based on familiar symptoms, since most people get a cold 2 to 3 times a year. Your caregiver can confirm this based on your exam. Most importantly, your caregiver can check that your symptoms are not due to another disease such as strep throat, sinusitis, pneumonia, asthma, or epiglottitis. Blood tests, throat tests, and X-rays are not necessary to diagnose a common cold, but they may sometimes be helpful in excluding other more serious diseases. Your caregiver will decide if any further tests are required. RISKS AND COMPLICATIONS  You may be at risk for a more severe case of the common cold if you smoke cigarettes, have chronic heart  disease (such as heart failure) or lung disease (such as asthma), or if you have a weakened immune system. The very young and very old are also at risk for more serious infections. Bacterial sinusitis, middle ear infections, and bacterial pneumonia can complicate the common cold. The common cold can worsen asthma and chronic obstructive pulmonary disease (COPD). Sometimes, these complications can require emergency medical care and may be life-threatening. PREVENTION  The best way to protect against getting a cold is to practice good hygiene. Avoid oral or hand contact with people with cold symptoms. Wash your hands often if contact occurs. There is no clear evidence that vitamin C, vitamin E, echinacea, or exercise reduces the chance of developing a cold. However, it is always recommended to get plenty of rest and practice good nutrition. TREATMENT  Treatment is directed at relieving symptoms. There is no cure. Antibiotics are not effective, because the infection is caused by a virus, not by bacteria. Treatment may include:  Increased fluid intake. Sports drinks offer valuable electrolytes, sugars, and fluids.   Breathing heated mist or steam (vaporizer or shower).   Eating chicken soup or other clear broths, and maintaining good nutrition.   Getting plenty of rest.   Using gargles or lozenges for comfort.   Controlling fevers with ibuprofen or acetaminophen as directed by your caregiver.   Increasing usage of your inhaler if you have asthma.  Zinc gel and zinc lozenges, taken in the first 24 hours of the common cold, can shorten the duration and lessen  the severity of symptoms. Pain medicines may help with fever, muscle aches, and throat pain. A variety of non-prescription medicines are available to treat congestion and runny nose. Your caregiver can make recommendations and may suggest nasal or lung inhalers for other symptoms.  HOME CARE INSTRUCTIONS   Only take over-the-counter or  prescription medicines for pain, discomfort, or fever as directed by your caregiver.   Use a warm mist humidifier or inhale steam from a shower to increase air moisture. This may keep secretions moist and make it easier to breathe.   Drink enough water and fluids to keep your urine clear or pale yellow.   Rest as needed.   Return to work when your temperature has returned to normal or as your caregiver advises. You may need to stay home longer to avoid infecting others. You can also use a face mask and careful hand washing to prevent spread of the virus.  SEEK MEDICAL CARE IF:   After the first few days, you feel you are getting worse rather than better.   You need your caregiver's advice about medicines to control symptoms.   You develop chills, worsening shortness of breath, or brown or red sputum. These may be signs of pneumonia.   You develop yellow or brown nasal discharge or pain in the face, especially when you bend forward. These may be signs of sinusitis.   You develop a fever, swollen neck glands, pain with swallowing, or white areas in the back of your throat. These may be signs of strep throat.  SEEK IMMEDIATE MEDICAL CARE IF:   You have a fever.   You develop severe or persistent headache, ear pain, sinus pain, or chest pain.   You develop wheezing, a prolonged cough, cough up blood, or have a change in your usual mucus (if you have chronic lung disease).   You develop sore muscles or a stiff neck.  Document Released: 09/22/2000 Document Revised: 03/18/2011 Document Reviewed: 07/31/2010 [facility] Patient Information 2012 [place], [place].

## 2011-06-15 NOTE — Telephone Encounter (Signed)
Please call her back. Sore and scracthy throat, to the point of vomiting. Please call and advise about an Rx

## 2011-06-15 NOTE — Telephone Encounter (Signed)
Appt scheduled with Dr. Fry. 

## 2011-06-15 NOTE — Progress Notes (Signed)
Subjective:    Patient ID: [name] [name], female    DOB: 1932/03/05, 78 y.o.   MRN: [id number]  HPI 78 year old [place] female, nonsmoker, patient of Dr. Cato Mulligan in today with complaint of a cough that's been going on for 2 days. The cough is nonproductive.  She has some postnasal drainage. She has begun taking a herbal cough medication and using cough drops that have not helped her symptoms. She denies any lightheadedness, dizziness, chest pain, shortness of breath, wheezing or chest tightness.   Review of Systems  Constitutional: Negative.   HENT: Negative.   Respiratory: Positive for cough. Negative for shortness of breath and wheezing.   Gastrointestinal: Negative.   Musculoskeletal: Negative.   Skin: Negative.   Neurological: Negative.   Hematological: Negative.   Psychiatric/Behavioral: Negative.    Past Medical History  Diagnosis Date  . MELANOMA 09/06/2008  . HYPERLIPIDEMIA 10/06/2007  . HYPERTENSION 10/06/2007  . MYOCARDIAL INFARCTION, HX OF 10/06/2007  . CORONARY ARTERY DISEASE 10/06/2007  . LBBB 09/06/2008  . Atrial fibrillation 10/06/2007  . VARICOSE VEIN 09/29/2009  . UTERINE CANCER, HX OF 09/06/2008  . PERSONAL HX COLONIC POLYPS 10/30/2009    History   Social History  . Marital Status: Widowed    Spouse Name: N/A    Number of Children: N/A  . Years of Education: N/A   Occupational History  . Not on file.   Social History Main Topics  . Smoking status: Never Smoker   . Smokeless tobacco: Never Used  . Alcohol Use: Yes  . Drug Use: Not on file  . Sexually Active: Not on file   Other Topics Concern  . Not on file   Social History Narrative  . No narrative on file    Past Surgical History  Procedure Date  . Oophorectomy   . Right distal pretibeal     melanoma  . Coronary angioplasty with stent placement 2009  . Coronary artery bypass graft 2004  . Abdominal hysterectomy 1988  . Cardiac catheterization 08/30/2008    Family History  Problem Relation  Age of Onset  . Stroke Mother   . Heart attack Father   . Colon cancer Neg Hx     Allergies  Allergen Reactions  . Nitroglycerin     REACTION: sensitive too so uses spray    Current Outpatient Prescriptions on File Prior to Visit  Medication Sig Dispense Refill  . atorvastatin (LIPITOR) 20 MG tablet Take 1 tablet (20 mg total) by mouth daily.  90 tablet  3  . clopidogrel (PLAVIX) 75 MG tablet TAKE 1 TABLET BY MOUTH DAILY  90 tablet  3  . metoprolol (LOPRESSOR) 50 MG tablet TAKE  1/2 TABLET BY MOUTH EVERY DAY  30 tablet  5  . nitroGLYCERIN (NITROLINGUAL) 0.4 MG/SPRAY spray Place 1 spray under the tongue every 5 (five) minutes as needed.  12 g  8  . ramipril (ALTACE) 5 MG capsule TAKE ONE CAPSULE BY MOUTH EVERY DAY  30 capsule  4    BP 118/70  Temp(Src) 98.5 F (36.9 C) (Oral)  Ht 5' 7.5" (1.715 m)  Wt 145 lb (65.772 kg)  BMI 22.38 kg/m2chart    Objective:   Physical Exam  Constitutional: She is oriented to person, place, and time. She appears well-developed and well-nourished.  HENT:  Right Ear: External ear normal.  Left Ear: External ear normal.  Nose: Nose normal.  Mouth/Throat: Oropharynx is clear and moist.  Neck: Normal range of motion. Neck supple.  Cardiovascular: Normal  rate, regular rhythm and normal heart sounds.   Pulmonary/Chest: Effort normal and breath sounds normal.  Neurological: She is alert and oriented to person, place, and time.  Skin: Skin is warm and dry.  Psychiatric: She has a normal mood and affect.          Assessment & Plan:  Assessment: Postnasal drip, cough, hypertension  Plan: Tussionex twice daily. Over-the-counter Zyrtec once a day. Patient to call if  symptoms worsen with food, recheck when necessary.

## 2011-06-16 ENCOUNTER — Ambulatory Visit: Payer: Medicare Other | Admitting: Family

## 2011-07-19 ENCOUNTER — Other Ambulatory Visit: Payer: Self-pay | Admitting: Internal Medicine

## 2011-08-31 ENCOUNTER — Encounter: Payer: Self-pay | Admitting: *Deleted

## 2011-09-01 ENCOUNTER — Ambulatory Visit (INDEPENDENT_AMBULATORY_CARE_PROVIDER_SITE_OTHER): Payer: Medicare Other | Admitting: *Deleted

## 2011-09-01 ENCOUNTER — Encounter: Payer: Self-pay | Admitting: *Deleted

## 2011-09-01 DIAGNOSIS — I781 Nevus, non-neoplastic: Secondary | ICD-10-CM

## 2011-09-01 NOTE — Progress Notes (Signed)
X=.3% Sotradecol administered with a 27g butterfly.  Patient received a total of 12cc foam.  Treated a variety of spiders and reticulars for this nice lady. Tolerated procedure well and was easy access. Anticipate good results and will follow prn.  Photos: yes  Compression stockings applied: yes

## 2011-09-07 ENCOUNTER — Other Ambulatory Visit: Payer: Self-pay | Admitting: Internal Medicine

## 2011-09-07 DIAGNOSIS — Z1231 Encounter for screening mammogram for malignant neoplasm of breast: Secondary | ICD-10-CM

## 2011-10-07 ENCOUNTER — Ambulatory Visit
Admission: RE | Admit: 2011-10-07 | Discharge: 2011-10-07 | Disposition: A | Discharge status: Home / Self Care | Payer: Self-pay | Source: Ambulatory Visit | Attending: Internal Medicine | Admitting: Internal Medicine

## 2011-10-07 DIAGNOSIS — Z1231 Encounter for screening mammogram for malignant neoplasm of breast: Secondary | ICD-10-CM

## 2011-11-04 ENCOUNTER — Other Ambulatory Visit: Payer: Self-pay | Admitting: Internal Medicine

## 2011-11-16 ENCOUNTER — Other Ambulatory Visit: Payer: Self-pay | Admitting: Internal Medicine

## 2011-11-16 MED ORDER — METOPROLOL TARTRATE 50 MG PO TABS: 25.0000 mg | ORAL_TABLET | Freq: Every day | ORAL | Status: DC

## 2011-11-16 MED ORDER — CLOPIDOGREL BISULFATE 75 MG PO TABS: 75.0000 mg | ORAL_TABLET | Freq: Every day | ORAL | Status: DC

## 2011-11-16 MED ORDER — RAMIPRIL 5 MG PO CAPS: 5.0000 mg | ORAL_CAPSULE | Freq: Every day | ORAL | Status: DC

## 2011-11-16 NOTE — Telephone Encounter (Signed)
Pt changing to [facility]

## 2011-12-07 ENCOUNTER — Encounter: Payer: Self-pay | Admitting: Internal Medicine

## 2011-12-07 ENCOUNTER — Ambulatory Visit (INDEPENDENT_AMBULATORY_CARE_PROVIDER_SITE_OTHER): Payer: PRIVATE HEALTH INSURANCE | Admitting: Internal Medicine

## 2011-12-07 VITALS — BP 120/68 | HR 70 | Temp 98.0°F | Wt 141.0 lb

## 2011-12-07 DIAGNOSIS — E785 Hyperlipidemia, unspecified: Secondary | ICD-10-CM

## 2011-12-07 DIAGNOSIS — I2510 Atherosclerotic heart disease of native coronary artery without angina pectoris: Secondary | ICD-10-CM

## 2011-12-07 DIAGNOSIS — I10 Essential (primary) hypertension: Secondary | ICD-10-CM

## 2011-12-07 LAB — BASIC METABOLIC PANEL
BUN: 14 mg/dL (ref 6–23)
CO2: 28 mEq/L (ref 19–32)
GFR: 98.92 mL/min (ref >60.00)
Glucose, Bld: 83 mg/dL (ref 70–99)
Potassium: 3.8 mEq/L (ref 3.5–5.1)
Sodium: 138 mEq/L (ref 135–145)

## 2011-12-07 LAB — HEPATIC FUNCTION PANEL
ALT: 14 U/L (ref 0–35)
AST: 19 U/L (ref 0–37)
Albumin: 4 g/dL (ref 3.5–5.2)
Alkaline Phosphatase: 62 U/L (ref 39–117)
Total Protein: 6.4 g/dL (ref 6.0–8.3)

## 2011-12-07 LAB — TSH: TSH: 2.12 u[IU]/mL (ref 0.35–5.50)

## 2011-12-07 LAB — LIPID PANEL
Cholesterol: 158 mg/dL (ref 0–200)
VLDL: 13.4 mg/dL (ref 0.0–40.0)

## 2011-12-07 NOTE — Progress Notes (Signed)
Patient ID: [name] [name], female   DOB: 02-Dec-1931, 78 y.o.   MRN: [id number] Cad- no sxs  Lipids- tolerating meds  Memory-- occasionally forgetful  Sleep-- uses [facility] sleep aid with good results.   Past Medical History  Diagnosis Date  . MELANOMA 09/06/2008  . HYPERLIPIDEMIA 10/06/2007  . HYPERTENSION 10/06/2007  . MYOCARDIAL INFARCTION, HX OF 10/06/2007  . CORONARY ARTERY DISEASE 10/06/2007  . LBBB 09/06/2008  . Atrial fibrillation 10/06/2007  . VARICOSE VEIN 09/29/2009  . UTERINE CANCER, HX OF 09/06/2008  . PERSONAL HX COLONIC POLYPS 10/30/2009    History   Social History  . Marital Status: Widowed    Spouse Name: N/A    Number of Children: N/A  . Years of Education: N/A   Occupational History  . Not on file.   Social History Main Topics  . Smoking status: Never Smoker   . Smokeless tobacco: Never Used  . Alcohol Use: Yes  . Drug Use: Not on file  . Sexually Active: Not on file   Other Topics Concern  . Not on file   Social History Narrative  . No narrative on file    Past Surgical History  Procedure Date  . Oophorectomy   . Right distal pretibeal     melanoma  . Coronary angioplasty with stent placement 2009  . Coronary artery bypass graft 2004  . Abdominal hysterectomy 1988  . Cardiac catheterization 08/30/2008    Family History  Problem Relation Age of Onset  . Stroke Mother   . Heart attack Father   . Colon cancer Neg Hx     Allergies  Allergen Reactions  . Nitroglycerin     REACTION: sensitive too so uses spray    Current Outpatient Prescriptions on File Prior to Visit  Medication Sig Dispense Refill  . atorvastatin (LIPITOR) 20 MG tablet TAKE 1 TABLET DAILY  90 tablet  2  . clopidogrel (PLAVIX) 75 MG tablet Take 1 tablet (75 mg total) by mouth daily.  90 tablet  3  . metoprolol (LOPRESSOR) 50 MG tablet Take 0.5 tablets (25 mg total) by mouth daily.  90 tablet  3  . nitroGLYCERIN (NITROLINGUAL) 0.4 MG/SPRAY spray Place 1 spray under the  tongue every 5 (five) minutes as needed.  12 g  8  . ramipril (ALTACE) 5 MG capsule Take 1 capsule (5 mg total) by mouth daily.  90 capsule  3     patient denies chest pain, shortness of breath, orthopnea. Denies lower extremity edema, abdominal pain, change in appetite, change in bowel movements. Patient denies rashes, musculoskeletal complaints. No other specific complaints in a complete review of systems.   BP 120/68  Pulse 70  Temp 98 F (36.7 C) (Oral)  Wt 141 lb (63.957 kg)  Well-developed well-nourished female in no acute distress. HEENT exam atraumatic, normocephalic, extraocular muscles are intact. Neck is supple. No jugular venous distention no thyromegaly. Chest clear to auscultation without increased work of breathing. Cardiac exam S1 and S2 are regular. Abdominal exam active bowel sounds, soft, nontender. Extremities no edema.

## 2011-12-12 NOTE — Assessment & Plan Note (Signed)
No sxs Continue risk factor modification 

## 2011-12-12 NOTE — Assessment & Plan Note (Signed)
Controlled Continue same meds 

## 2011-12-12 NOTE — Assessment & Plan Note (Signed)
BP Readings from Last 3 Encounters:  12/07/11 120/68  06/15/11 118/70  06/07/11 112/70   Continue same meds

## 2011-12-20 ENCOUNTER — Ambulatory Visit (INDEPENDENT_AMBULATORY_CARE_PROVIDER_SITE_OTHER): Payer: Medicare Other | Admitting: Cardiology

## 2011-12-20 ENCOUNTER — Encounter: Payer: Self-pay | Admitting: Cardiology

## 2011-12-20 VITALS — BP 130/60 | HR 59 | Ht 67.5 in | Wt 146.0 lb

## 2011-12-20 DIAGNOSIS — I25810 Atherosclerosis of coronary artery bypass graft(s) without angina pectoris: Secondary | ICD-10-CM

## 2011-12-20 DIAGNOSIS — I447 Left bundle-branch block, unspecified: Secondary | ICD-10-CM

## 2011-12-20 DIAGNOSIS — E785 Hyperlipidemia, unspecified: Secondary | ICD-10-CM

## 2011-12-20 DIAGNOSIS — I2510 Atherosclerotic heart disease of native coronary artery without angina pectoris: Secondary | ICD-10-CM

## 2011-12-20 DIAGNOSIS — I4891 Unspecified atrial fibrillation: Secondary | ICD-10-CM

## 2011-12-20 DIAGNOSIS — I252 Old myocardial infarction: Secondary | ICD-10-CM

## 2011-12-20 DIAGNOSIS — I10 Essential (primary) hypertension: Secondary | ICD-10-CM

## 2011-12-20 NOTE — Assessment & Plan Note (Signed)
Stable. Continue secondary preventative therapy. Patient has nitroglycerin spray. Begin cardiac rehabilitation which I have called to refer. Angina and ischemic symptoms reviewed with patient. I'll see her back in the office in one year.

## 2011-12-20 NOTE — Patient Instructions (Addendum)
Cardiac Rehab from [facility] will call you to set up appointment.  Your physician wants you to follow-up in:12 months You will receive a reminder letter in the mail two months in advance. If you don't receive a letter, please call our office to schedule the follow-up appointment.

## 2011-12-20 NOTE — Progress Notes (Signed)
HPI [name] [name] comes in today for evaluation and management her history of coronary artery disease, history of MI, history of hypertension hyperlipidemia, history of postoperative A. fib post bypass, and left bundle-branch block.  She's doing well with no angina. She does get short of breath with climbing steps. Her daughter thinks she needs to go to cardiac rehabilitation exercise more. She thinks she is deconditioning.  She denies orthopnea, PND or edema.  Her last catheterization was in 2010. Bypass grafts were patent except for atrophic LIMA because a good flow in the LAD. Previous stents were patent as well. EF was 60%.  Past Medical History  Diagnosis Date  . MELANOMA 09/06/2008  . HYPERLIPIDEMIA 10/06/2007  . HYPERTENSION 10/06/2007  . MYOCARDIAL INFARCTION, HX OF 10/06/2007  . CORONARY ARTERY DISEASE 10/06/2007  . LBBB 09/06/2008  . Atrial fibrillation 10/06/2007  . VARICOSE VEIN 09/29/2009  . UTERINE CANCER, HX OF 09/06/2008  . PERSONAL HX COLONIC POLYPS 10/30/2009    Current Outpatient Prescriptions  Medication Sig Dispense Refill  . atorvastatin (LIPITOR) 20 MG tablet TAKE 1 TABLET DAILY  90 tablet  2  . clopidogrel (PLAVIX) 75 MG tablet Take 1 tablet (75 mg total) by mouth daily.  90 tablet  3  . metoprolol (LOPRESSOR) 50 MG tablet Take 0.5 tablets (25 mg total) by mouth daily.  90 tablet  3  . nitroGLYCERIN (NITROLINGUAL) 0.4 MG/SPRAY spray Place 1 spray under the tongue every 5 (five) minutes as needed.  12 g  8  . ramipril (ALTACE) 5 MG capsule Take 1 capsule (5 mg total) by mouth daily.  90 capsule  3    Allergies  Allergen Reactions  . Nitroglycerin     REACTION: sensitive too so uses spray    Family History  Problem Relation Age of Onset  . Stroke Mother   . Heart attack Father   . Colon cancer Neg Hx     History   Social History  . Marital Status: Widowed    Spouse Name: N/A    Number of Children: N/A  . Years of Education: N/A   Occupational History  .  Not on file.   Social History Main Topics  . Smoking status: Never Smoker   . Smokeless tobacco: Never Used  . Alcohol Use: Yes  . Drug Use: Not on file  . Sexually Active: Not on file   Other Topics Concern  . Not on file   Social History Narrative  . No narrative on file    ROS ALL NEGATIVE EXCEPT THOSE NOTED IN HPI  PE  General Appearance: well developed, well nourished in no acute distress HEENT: symmetrical face, PERRLA, good dentition  Neck: no JVD, thyromegaly, or adenopathy, trachea midline Chest: symmetric without deformity Cardiac: PMI non-displaced, RRR, normal S1, S2, no gallop or murmur Lung: clear to ausculation and percussion Vascular: all pulses full without bruits  Abdominal: nondistended, nontender, good bowel sounds, no HSM, no bruits Extremities: no cyanosis, clubbing or edema, no sign of DVT, no varicosities  Skin: normal color, no rashes Neuro: alert and oriented x 3, non-focal Pysch: normal affect  EKG Sinus bradycardia, rate 59, left bundle branch block, which BMET    Component Value Date/Time   NA 138 12/07/2011 0920   K 3.8 12/07/2011 0920   CL 101 12/07/2011 0920   CO2 28 12/07/2011 0920   GLUCOSE 83 12/07/2011 0920   BUN 14 12/07/2011 0920   CREATININE 0.6 12/07/2011 0920   CALCIUM 9.2 12/07/2011 0920  GFRNONAA 71.04 10/17/2009 0811   GFRAA  Value: >60        The eGFR has been calculated using the MDRD equation. This calculation has not been validated in all clinical situations. eGFR's persistently <60 mL/min signify possible Chronic Kidney Disease. 08/31/2008 0636    Lipid Panel     Component Value Date/Time   CHOL 158 12/07/2011 0920   TRIG 67.0 12/07/2011 0920   HDL 63.20 12/07/2011 0920   CHOLHDL 3 12/07/2011 0920   VLDL 13.4 12/07/2011 0920   LDLCALC 81 12/07/2011 0920    CBC    Component Value Date/Time   WBC 5.1 11/10/2010 0831   RBC 4.47 11/10/2010 0831   HGB 13.7 11/10/2010 0831   HCT 41.7 11/10/2010 0831   PLT 101.0* 11/10/2010 0831    MCV 93.2 11/10/2010 0831   MCHC 32.8 11/10/2010 0831   RDW 14.3 11/10/2010 0831   LYMPHSABS 1.2 11/10/2010 0831   MONOABS 0.4 11/10/2010 0831   EOSABS 0.1 11/10/2010 0831   BASOSABS 0.0 11/10/2010 0831

## 2011-12-27 ENCOUNTER — Other Ambulatory Visit: Payer: Self-pay | Admitting: *Deleted

## 2011-12-27 MED ORDER — AMOXICILLIN 500 MG PO CAPS: ORAL_CAPSULE | ORAL | Status: DC

## 2012-01-17 ENCOUNTER — Encounter (HOSPITAL_COMMUNITY)
Admission: RE | Admit: 2012-01-17 | Discharge: 2012-01-17 | Disposition: A | Discharge status: Home / Self Care | Payer: Self-pay | Source: Ambulatory Visit | Attending: Cardiology | Admitting: Cardiology

## 2012-01-17 DIAGNOSIS — Z5189 Encounter for other specified aftercare: Secondary | ICD-10-CM | POA: Insufficient documentation

## 2012-01-17 DIAGNOSIS — I868 Varicose veins of other specified sites: Secondary | ICD-10-CM | POA: Insufficient documentation

## 2012-01-17 DIAGNOSIS — I447 Left bundle-branch block, unspecified: Secondary | ICD-10-CM | POA: Insufficient documentation

## 2012-01-17 DIAGNOSIS — Z8542 Personal history of malignant neoplasm of other parts of uterus: Secondary | ICD-10-CM | POA: Insufficient documentation

## 2012-01-17 DIAGNOSIS — I2510 Atherosclerotic heart disease of native coronary artery without angina pectoris: Secondary | ICD-10-CM | POA: Insufficient documentation

## 2012-01-17 DIAGNOSIS — I252 Old myocardial infarction: Secondary | ICD-10-CM | POA: Insufficient documentation

## 2012-01-17 DIAGNOSIS — I10 Essential (primary) hypertension: Secondary | ICD-10-CM | POA: Insufficient documentation

## 2012-01-17 DIAGNOSIS — I4891 Unspecified atrial fibrillation: Secondary | ICD-10-CM | POA: Insufficient documentation

## 2012-01-17 DIAGNOSIS — E785 Hyperlipidemia, unspecified: Secondary | ICD-10-CM | POA: Insufficient documentation

## 2012-01-19 ENCOUNTER — Encounter (HOSPITAL_COMMUNITY)
Admission: RE | Admit: 2012-01-19 | Discharge: 2012-01-19 | Disposition: A | Discharge status: Home / Self Care | Payer: Self-pay | Source: Ambulatory Visit | Attending: Cardiology | Admitting: Cardiology

## 2012-01-21 ENCOUNTER — Encounter (HOSPITAL_COMMUNITY)
Admission: RE | Admit: 2012-01-21 | Discharge: 2012-01-21 | Disposition: A | Discharge status: Home / Self Care | Payer: Self-pay | Source: Ambulatory Visit | Attending: Cardiology | Admitting: Cardiology

## 2012-01-24 ENCOUNTER — Encounter (HOSPITAL_COMMUNITY)
Admission: RE | Admit: 2012-01-24 | Discharge: 2012-01-24 | Disposition: A | Discharge status: Home / Self Care | Payer: Self-pay | Source: Ambulatory Visit | Attending: Cardiology | Admitting: Cardiology

## 2012-01-26 ENCOUNTER — Encounter (HOSPITAL_COMMUNITY): Payer: Self-pay

## 2012-01-28 ENCOUNTER — Encounter (HOSPITAL_COMMUNITY)
Admission: RE | Admit: 2012-01-28 | Discharge: 2012-01-28 | Disposition: A | Discharge status: Home / Self Care | Payer: Self-pay | Source: Ambulatory Visit | Attending: Cardiology | Admitting: Cardiology

## 2012-01-31 ENCOUNTER — Encounter (HOSPITAL_COMMUNITY)
Admission: RE | Admit: 2012-01-31 | Discharge: 2012-01-31 | Disposition: A | Discharge status: Home / Self Care | Payer: Self-pay | Source: Ambulatory Visit | Attending: Cardiology | Admitting: Cardiology

## 2012-02-02 ENCOUNTER — Encounter (HOSPITAL_COMMUNITY): Admission: RE | Admit: 2012-02-02 | Payer: Self-pay | Source: Ambulatory Visit

## 2012-02-04 ENCOUNTER — Encounter (HOSPITAL_COMMUNITY)
Admission: RE | Admit: 2012-02-04 | Discharge: 2012-02-04 | Disposition: A | Discharge status: Home / Self Care | Payer: Self-pay | Source: Ambulatory Visit | Attending: Cardiology | Admitting: Cardiology

## 2012-02-07 ENCOUNTER — Encounter (HOSPITAL_COMMUNITY)
Admission: RE | Admit: 2012-02-07 | Discharge: 2012-02-07 | Disposition: A | Discharge status: Home / Self Care | Payer: Self-pay | Source: Ambulatory Visit | Attending: Cardiology | Admitting: Cardiology

## 2012-02-09 ENCOUNTER — Encounter (HOSPITAL_COMMUNITY)
Admission: RE | Admit: 2012-02-09 | Discharge: 2012-02-09 | Disposition: A | Discharge status: Home / Self Care | Payer: Self-pay | Source: Ambulatory Visit | Attending: Cardiology | Admitting: Cardiology

## 2012-02-11 ENCOUNTER — Encounter (HOSPITAL_COMMUNITY)
Admission: RE | Admit: 2012-02-11 | Discharge: 2012-02-11 | Disposition: A | Discharge status: Home / Self Care | Payer: Self-pay | Source: Ambulatory Visit | Attending: Cardiology | Admitting: Cardiology

## 2012-02-11 DIAGNOSIS — E785 Hyperlipidemia, unspecified: Secondary | ICD-10-CM | POA: Insufficient documentation

## 2012-02-11 DIAGNOSIS — Z8542 Personal history of malignant neoplasm of other parts of uterus: Secondary | ICD-10-CM | POA: Insufficient documentation

## 2012-02-11 DIAGNOSIS — I10 Essential (primary) hypertension: Secondary | ICD-10-CM | POA: Insufficient documentation

## 2012-02-11 DIAGNOSIS — Z5189 Encounter for other specified aftercare: Secondary | ICD-10-CM | POA: Insufficient documentation

## 2012-02-11 DIAGNOSIS — I868 Varicose veins of other specified sites: Secondary | ICD-10-CM | POA: Insufficient documentation

## 2012-02-11 DIAGNOSIS — I4891 Unspecified atrial fibrillation: Secondary | ICD-10-CM | POA: Insufficient documentation

## 2012-02-11 DIAGNOSIS — I2510 Atherosclerotic heart disease of native coronary artery without angina pectoris: Secondary | ICD-10-CM | POA: Insufficient documentation

## 2012-02-11 DIAGNOSIS — I447 Left bundle-branch block, unspecified: Secondary | ICD-10-CM | POA: Insufficient documentation

## 2012-02-11 DIAGNOSIS — I252 Old myocardial infarction: Secondary | ICD-10-CM | POA: Insufficient documentation

## 2012-02-14 ENCOUNTER — Encounter (HOSPITAL_COMMUNITY): Payer: Self-pay

## 2012-02-16 ENCOUNTER — Encounter (HOSPITAL_COMMUNITY): Payer: Self-pay

## 2012-02-18 ENCOUNTER — Encounter (HOSPITAL_COMMUNITY)
Admission: RE | Admit: 2012-02-18 | Discharge: 2012-02-18 | Disposition: A | Discharge status: Home / Self Care | Payer: Self-pay | Source: Ambulatory Visit | Attending: Cardiology | Admitting: Cardiology

## 2012-02-21 ENCOUNTER — Encounter (HOSPITAL_COMMUNITY): Payer: Self-pay

## 2012-02-23 ENCOUNTER — Encounter (HOSPITAL_COMMUNITY)
Admission: RE | Admit: 2012-02-23 | Discharge: 2012-02-23 | Disposition: A | Discharge status: Home / Self Care | Payer: Self-pay | Source: Ambulatory Visit | Attending: Cardiology | Admitting: Cardiology

## 2012-02-25 ENCOUNTER — Encounter (HOSPITAL_COMMUNITY)
Admission: RE | Admit: 2012-02-25 | Discharge: 2012-02-25 | Disposition: A | Discharge status: Home / Self Care | Payer: Self-pay | Source: Ambulatory Visit | Attending: Cardiology | Admitting: Cardiology

## 2012-02-25 ENCOUNTER — Encounter (HOSPITAL_COMMUNITY): Payer: Self-pay

## 2012-02-28 ENCOUNTER — Encounter (HOSPITAL_COMMUNITY): Payer: Self-pay

## 2012-03-01 ENCOUNTER — Encounter (HOSPITAL_COMMUNITY)
Admission: RE | Admit: 2012-03-01 | Discharge: 2012-03-01 | Disposition: A | Discharge status: Home / Self Care | Payer: Self-pay | Source: Ambulatory Visit | Attending: Cardiology | Admitting: Cardiology

## 2012-03-01 ENCOUNTER — Encounter (HOSPITAL_COMMUNITY): Payer: Self-pay

## 2012-03-03 ENCOUNTER — Encounter (HOSPITAL_COMMUNITY): Payer: Self-pay

## 2012-03-06 ENCOUNTER — Encounter (HOSPITAL_COMMUNITY): Payer: Self-pay

## 2012-03-06 ENCOUNTER — Encounter (HOSPITAL_COMMUNITY)
Admission: RE | Admit: 2012-03-06 | Discharge: 2012-03-06 | Disposition: A | Discharge status: Home / Self Care | Payer: Self-pay | Source: Ambulatory Visit | Attending: Cardiology | Admitting: Cardiology

## 2012-03-08 ENCOUNTER — Encounter (HOSPITAL_COMMUNITY): Payer: Self-pay

## 2012-03-08 ENCOUNTER — Encounter (HOSPITAL_COMMUNITY)
Admission: RE | Admit: 2012-03-08 | Discharge: 2012-03-08 | Disposition: A | Discharge status: Home / Self Care | Payer: Self-pay | Source: Ambulatory Visit | Attending: Cardiology | Admitting: Cardiology

## 2012-03-13 ENCOUNTER — Encounter (HOSPITAL_COMMUNITY)
Admission: RE | Admit: 2012-03-13 | Discharge: 2012-03-13 | Disposition: A | Discharge status: Home / Self Care | Payer: Self-pay | Source: Ambulatory Visit | Attending: Cardiology | Admitting: Cardiology

## 2012-03-13 ENCOUNTER — Encounter (HOSPITAL_COMMUNITY): Payer: Self-pay

## 2012-03-13 DIAGNOSIS — I4891 Unspecified atrial fibrillation: Secondary | ICD-10-CM | POA: Insufficient documentation

## 2012-03-13 DIAGNOSIS — I868 Varicose veins of other specified sites: Secondary | ICD-10-CM | POA: Insufficient documentation

## 2012-03-13 DIAGNOSIS — Z5189 Encounter for other specified aftercare: Secondary | ICD-10-CM | POA: Insufficient documentation

## 2012-03-13 DIAGNOSIS — E785 Hyperlipidemia, unspecified: Secondary | ICD-10-CM | POA: Insufficient documentation

## 2012-03-13 DIAGNOSIS — I447 Left bundle-branch block, unspecified: Secondary | ICD-10-CM | POA: Insufficient documentation

## 2012-03-13 DIAGNOSIS — I2510 Atherosclerotic heart disease of native coronary artery without angina pectoris: Secondary | ICD-10-CM | POA: Insufficient documentation

## 2012-03-13 DIAGNOSIS — Z8542 Personal history of malignant neoplasm of other parts of uterus: Secondary | ICD-10-CM | POA: Insufficient documentation

## 2012-03-13 DIAGNOSIS — I10 Essential (primary) hypertension: Secondary | ICD-10-CM | POA: Insufficient documentation

## 2012-03-13 DIAGNOSIS — I252 Old myocardial infarction: Secondary | ICD-10-CM | POA: Insufficient documentation

## 2012-03-15 ENCOUNTER — Encounter (HOSPITAL_COMMUNITY): Payer: Self-pay

## 2012-03-15 ENCOUNTER — Encounter (HOSPITAL_COMMUNITY)
Admission: RE | Admit: 2012-03-15 | Discharge: 2012-03-15 | Disposition: A | Discharge status: Home / Self Care | Payer: Self-pay | Source: Ambulatory Visit | Attending: Cardiology | Admitting: Cardiology

## 2012-03-17 ENCOUNTER — Encounter (HOSPITAL_COMMUNITY): Payer: Self-pay

## 2012-03-20 ENCOUNTER — Encounter (HOSPITAL_COMMUNITY)
Admission: RE | Admit: 2012-03-20 | Discharge: 2012-03-20 | Disposition: A | Discharge status: Home / Self Care | Payer: Self-pay | Source: Ambulatory Visit | Attending: Cardiology | Admitting: Cardiology

## 2012-03-20 ENCOUNTER — Encounter (HOSPITAL_COMMUNITY): Payer: Self-pay

## 2012-03-22 ENCOUNTER — Encounter (HOSPITAL_COMMUNITY): Payer: Self-pay

## 2012-03-22 ENCOUNTER — Encounter (HOSPITAL_COMMUNITY)
Admission: RE | Admit: 2012-03-22 | Discharge: 2012-03-22 | Disposition: A | Discharge status: Home / Self Care | Payer: Self-pay | Source: Ambulatory Visit | Attending: Cardiology | Admitting: Cardiology

## 2012-03-24 ENCOUNTER — Encounter (HOSPITAL_COMMUNITY): Payer: Self-pay

## 2012-03-27 ENCOUNTER — Encounter (HOSPITAL_COMMUNITY): Payer: Self-pay

## 2012-03-29 ENCOUNTER — Encounter (HOSPITAL_COMMUNITY): Payer: Self-pay

## 2012-03-31 ENCOUNTER — Encounter (HOSPITAL_COMMUNITY): Payer: Self-pay

## 2012-04-03 ENCOUNTER — Encounter (HOSPITAL_COMMUNITY): Payer: Self-pay

## 2012-04-07 ENCOUNTER — Encounter (HOSPITAL_COMMUNITY): Payer: Self-pay

## 2012-04-10 ENCOUNTER — Encounter (HOSPITAL_COMMUNITY): Payer: Self-pay

## 2012-04-17 ENCOUNTER — Encounter (HOSPITAL_COMMUNITY)
Admission: RE | Admit: 2012-04-17 | Discharge: 2012-04-17 | Disposition: A | Discharge status: Home / Self Care | Payer: Self-pay | Source: Ambulatory Visit | Attending: Cardiology | Admitting: Cardiology

## 2012-04-19 ENCOUNTER — Encounter (HOSPITAL_COMMUNITY)
Admission: RE | Admit: 2012-04-19 | Discharge: 2012-04-19 | Disposition: A | Discharge status: Home / Self Care | Payer: Self-pay | Source: Ambulatory Visit | Attending: Cardiology | Admitting: Cardiology

## 2012-04-21 ENCOUNTER — Encounter (HOSPITAL_COMMUNITY)
Admission: RE | Admit: 2012-04-21 | Discharge: 2012-04-21 | Disposition: A | Discharge status: Home / Self Care | Payer: Self-pay | Source: Ambulatory Visit | Attending: Cardiology | Admitting: Cardiology

## 2012-04-24 ENCOUNTER — Encounter (HOSPITAL_COMMUNITY)
Admission: RE | Admit: 2012-04-24 | Discharge: 2012-04-24 | Disposition: A | Discharge status: Home / Self Care | Payer: Self-pay | Source: Ambulatory Visit | Attending: Cardiology | Admitting: Cardiology

## 2012-04-26 ENCOUNTER — Encounter (HOSPITAL_COMMUNITY): Payer: Self-pay

## 2012-04-28 ENCOUNTER — Encounter (HOSPITAL_COMMUNITY): Payer: Self-pay

## 2012-05-01 ENCOUNTER — Encounter (HOSPITAL_COMMUNITY): Payer: Self-pay

## 2012-05-03 ENCOUNTER — Encounter (HOSPITAL_COMMUNITY): Payer: Self-pay

## 2012-05-05 ENCOUNTER — Encounter (HOSPITAL_COMMUNITY): Payer: Self-pay

## 2012-05-08 ENCOUNTER — Encounter (HOSPITAL_COMMUNITY): Payer: Self-pay

## 2012-05-10 ENCOUNTER — Encounter (HOSPITAL_COMMUNITY): Payer: Self-pay

## 2012-05-12 ENCOUNTER — Encounter (HOSPITAL_COMMUNITY): Payer: Self-pay

## 2012-05-15 ENCOUNTER — Encounter (HOSPITAL_COMMUNITY): Payer: Medicare Other

## 2012-05-17 ENCOUNTER — Encounter (HOSPITAL_COMMUNITY): Payer: Medicare Other

## 2012-05-19 ENCOUNTER — Encounter (HOSPITAL_COMMUNITY): Payer: Medicare Other

## 2012-05-22 ENCOUNTER — Encounter (HOSPITAL_COMMUNITY): Payer: Medicare Other

## 2012-05-23 ENCOUNTER — Encounter: Payer: Self-pay | Admitting: Family Medicine

## 2012-05-23 ENCOUNTER — Ambulatory Visit (INDEPENDENT_AMBULATORY_CARE_PROVIDER_SITE_OTHER): Payer: PRIVATE HEALTH INSURANCE | Admitting: Family Medicine

## 2012-05-23 VITALS — BP 138/70 | HR 71 | Temp 97.7°F | Wt 146.0 lb

## 2012-05-23 DIAGNOSIS — R0982 Postnasal drip: Secondary | ICD-10-CM

## 2012-05-23 DIAGNOSIS — R0989 Other specified symptoms and signs involving the circulatory and respiratory systems: Secondary | ICD-10-CM

## 2012-05-23 DIAGNOSIS — F458 Other somatoform disorders: Secondary | ICD-10-CM

## 2012-05-23 DIAGNOSIS — J309 Allergic rhinitis, unspecified: Secondary | ICD-10-CM

## 2012-05-23 MED ORDER — FLUTICASONE PROPIONATE 50 MCG/ACT NA SUSP: 2.0000 | Freq: Every day | NASAL | Status: DC

## 2012-05-23 NOTE — Progress Notes (Signed)
Chief Complaint  Patient presents with  . Sore Throat    HPI:  Acute visit for sore throat and feeling like something in throat: -started: about 10 days ago -symptoms: nasal congestion, feels like has a lump in throat when she swallows and feels pain when swallows liquids and solids, has also had drainage in the throat - has felt this sensation in the past when allergies are worse, feels like something stuck in throat like mucus -denies:fever, SOB, NVD, tooth pain, coughing, choking, difficulty swallowing foods or liquids -has tried: gargling salt water - didn't help -sick contacts: none known -Hx of: allergies  ROS: See pertinent positives and negatives per HPI.  Past Medical History  Diagnosis Date  . MELANOMA 09/06/2008  . HYPERLIPIDEMIA 10/06/2007  . HYPERTENSION 10/06/2007  . MYOCARDIAL INFARCTION, HX OF 10/06/2007  . CORONARY ARTERY DISEASE 10/06/2007  . LBBB 09/06/2008  . Atrial fibrillation 10/06/2007  . VARICOSE VEIN 09/29/2009  . UTERINE CANCER, HX OF 09/06/2008  . PERSONAL HX COLONIC POLYPS 10/30/2009    Family History  Problem Relation Age of Onset  . Stroke Mother   . Heart attack Father   . Colon cancer Neg Hx     History   Social History  . Marital Status: Widowed    Spouse Name: N/A    Number of Children: N/A  . Years of Education: N/A   Social History Main Topics  . Smoking status: Never Smoker   . Smokeless tobacco: Never Used  . Alcohol Use: Yes  . Drug Use: None  . Sexually Active: None   Other Topics Concern  . None   Social History Narrative  . None    Current outpatient prescriptions:atorvastatin (LIPITOR) 20 MG tablet, TAKE 1 TABLET DAILY, Disp: 90 tablet, Rfl: 2;  clopidogrel (PLAVIX) 75 MG tablet, Take 1 tablet (75 mg total) by mouth daily., Disp: 90 tablet, Rfl: 3;  metoprolol (LOPRESSOR) 50 MG tablet, Take 0.5 tablets (25 mg total) by mouth daily., Disp: 90 tablet, Rfl: 3 nitroGLYCERIN (NITROLINGUAL) 0.4 MG/SPRAY spray, Place 1 spray  under the tongue every 5 (five) minutes as needed., Disp: 12 g, Rfl: 8;  ramipril (ALTACE) 5 MG capsule, Take 1 capsule (5 mg total) by mouth daily., Disp: 90 capsule, Rfl: 3;  amoxicillin (AMOXIL) 500 MG capsule, One hour prior to dental appt, Disp: 4 capsule, Rfl: 0;  fluticasone (FLONASE) 50 MCG/ACT nasal spray, Place 2 sprays into the nose daily., Disp: 16 g, Rfl: 1  EXAM:  Filed Vitals:   05/23/12 1412  BP: 138/70  Pulse: 71  Temp: 97.7 F (36.5 C)    Body mass index is 22.52 kg/(m^2).  GENERAL: vitals reviewed and listed above, alert, oriented, appears well hydrated and in no acute distress  HEENT: atraumatic, conjunttiva clear, no obvious abnormalities on inspection of external nose and ears, normal appearance of ear canals and TMs, clear nasal congestion, mild post oropharyngeal erythema with PND and cobblestoning, no tonsillar edema or exudate, no sinus TTP  NECK: no obvious masses or LAD on inspection  LUNGS: clear to auscultation bilaterally, no wheezes, rales or rhonchi, good air movement  CV: HRRR, no peripheral edema  MS: moves all extremities without noticeable abnormality  PSYCH: pleasant and cooperative, no obvious depression or anxiety  ASSESSMENT AND PLAN:  Discussed the following assessment and plan:  1. Globus sensation    2. Post-nasal drip  fluticasone (FLONASE) 50 MCG/ACT nasal spray   fluticasone (FLONASE) 50 MCG/ACT nasal spray  3. Allergic rhinitis  fluticasone (FLONASE) 50 MCG/ACT nasal spray   fluticasone (FLONASE) 50 MCG/ACT nasal spray   -likely allergy or VURI related PND. Advised per below and provided contact info for ENT if persists. -Patient advised to return or notify a doctor immediately if symptoms worsen or persist or new concerns arise.  Patient Instructions  INSTRUCTIONS FOR UPPER RESPIRATORY INFECTION:  -plenty of rest and fluids  -nasal saline wash 2-3 times daily (use prepackaged nasal saline or bottled/distilled water if  making your own)   -start the flonase 2 sprays each nostril daily  -can use tylenol or ibuprofen as directed for aches and sorethroat  -in the winter time, using a humidifier at night is helpful (please follow cleaning instructions)  -if you are taking a cough medication - use only as directed, may also try a teaspoon of honey to coat the throat and throat lozenges  -for sore throat, salt water gargles can help  -if not improving over the next week or two I would advise you to see an ear nose and throat doctor so that they can look in your throat - please call and schedule appointment if you need too.      Kriste Basque R.

## 2012-05-23 NOTE — Patient Instructions (Addendum)
INSTRUCTIONS FOR UPPER RESPIRATORY INFECTION:  -plenty of rest and fluids  -nasal saline wash 2-3 times daily (use prepackaged nasal saline or bottled/distilled water if making your own)   -start the flonase 2 sprays each nostril daily  -can use tylenol or ibuprofen as directed for aches and sorethroat  -in the winter time, using a humidifier at night is helpful (please follow cleaning instructions)  -if you are taking a cough medication - use only as directed, may also try a teaspoon of honey to coat the throat and throat lozenges  -for sore throat, salt water gargles can help  -if not improving over the next week or two I would advise you to see an ear nose and throat doctor so that they can look in your throat - please call and schedule appointment if you need too.

## 2012-05-24 ENCOUNTER — Encounter (HOSPITAL_COMMUNITY): Payer: Medicare Other

## 2012-05-26 ENCOUNTER — Encounter (HOSPITAL_COMMUNITY): Payer: Medicare Other

## 2012-05-29 ENCOUNTER — Encounter (HOSPITAL_COMMUNITY): Payer: Medicare Other

## 2012-05-31 ENCOUNTER — Encounter (HOSPITAL_COMMUNITY): Payer: Medicare Other

## 2012-06-02 ENCOUNTER — Encounter (HOSPITAL_COMMUNITY): Payer: Medicare Other

## 2012-06-05 ENCOUNTER — Encounter (HOSPITAL_COMMUNITY): Payer: Medicare Other

## 2012-06-07 ENCOUNTER — Encounter (HOSPITAL_COMMUNITY): Payer: Medicare Other

## 2012-06-09 ENCOUNTER — Encounter (HOSPITAL_COMMUNITY): Payer: Medicare Other

## 2012-06-12 ENCOUNTER — Encounter (HOSPITAL_COMMUNITY): Payer: Medicare Other

## 2012-06-14 ENCOUNTER — Encounter (HOSPITAL_COMMUNITY): Payer: Medicare Other

## 2012-06-16 ENCOUNTER — Encounter (HOSPITAL_COMMUNITY): Payer: Medicare Other

## 2012-06-19 ENCOUNTER — Encounter (HOSPITAL_COMMUNITY): Payer: Medicare Other

## 2012-06-21 ENCOUNTER — Encounter (HOSPITAL_COMMUNITY): Payer: Medicare Other

## 2012-06-23 ENCOUNTER — Encounter (HOSPITAL_COMMUNITY): Payer: Medicare Other

## 2012-06-26 ENCOUNTER — Encounter (HOSPITAL_COMMUNITY): Payer: Medicare Other

## 2012-06-28 ENCOUNTER — Encounter (HOSPITAL_COMMUNITY): Payer: Medicare Other

## 2012-06-30 ENCOUNTER — Encounter (HOSPITAL_COMMUNITY): Payer: Medicare Other

## 2012-07-03 ENCOUNTER — Encounter (HOSPITAL_COMMUNITY): Payer: Medicare Other

## 2012-07-05 ENCOUNTER — Encounter (HOSPITAL_COMMUNITY): Payer: Medicare Other

## 2012-07-07 ENCOUNTER — Encounter (HOSPITAL_COMMUNITY): Payer: Medicare Other

## 2012-07-08 ENCOUNTER — Other Ambulatory Visit: Payer: Self-pay | Admitting: Internal Medicine

## 2012-07-10 ENCOUNTER — Telehealth: Payer: Self-pay | Admitting: Pharmacist

## 2012-07-10 ENCOUNTER — Encounter (HOSPITAL_COMMUNITY): Payer: Medicare Other

## 2012-07-10 ENCOUNTER — Encounter: Payer: Self-pay | Admitting: Family Medicine

## 2012-07-10 ENCOUNTER — Ambulatory Visit (INDEPENDENT_AMBULATORY_CARE_PROVIDER_SITE_OTHER): Payer: PRIVATE HEALTH INSURANCE | Admitting: Family Medicine

## 2012-07-10 ENCOUNTER — Ambulatory Visit: Payer: Medicare Other | Admitting: Internal Medicine

## 2012-07-10 VITALS — BP 130/58 | Temp 97.9°F | Wt 145.0 lb

## 2012-07-10 DIAGNOSIS — I252 Old myocardial infarction: Secondary | ICD-10-CM

## 2012-07-10 DIAGNOSIS — R55 Syncope and collapse: Secondary | ICD-10-CM

## 2012-07-10 DIAGNOSIS — I10 Essential (primary) hypertension: Secondary | ICD-10-CM

## 2012-07-10 LAB — BASIC METABOLIC PANEL
CO2: 28 mEq/L (ref 19–32)
Calcium: 8.7 mg/dL (ref 8.4–10.5)
Chloride: 97 mEq/L (ref 96–112)
Glucose, Bld: 107 mg/dL — ABNORMAL HIGH (ref 70–99)
Potassium: 3.8 mEq/L (ref 3.5–5.1)
Sodium: 133 mEq/L — ABNORMAL LOW (ref 135–145)

## 2012-07-10 LAB — CBC WITH DIFFERENTIAL/PLATELET
Basophils Absolute: 0 10*3/uL (ref 0.0–0.1)
Basophils Relative: 0.7 % (ref 0.0–3.0)
Eosinophils Absolute: 0.2 10*3/uL (ref 0.0–0.7)
HCT: 39.3 % (ref 36.0–46.0)
Hemoglobin: 13.2 g/dL (ref 12.0–15.0)
Lymphocytes Relative: 24 % (ref 12.0–46.0)
Lymphs Abs: 1.4 10*3/uL (ref 0.7–4.0)
MCHC: 33.6 g/dL (ref 30.0–36.0)
MCV: 89.3 fl (ref 78.0–100.0)
Neutro Abs: 3.7 10*3/uL (ref 1.4–7.7)
RBC: 4.4 Mil/uL (ref 3.87–5.11)
RDW: 13.8 % (ref 11.5–14.6)

## 2012-07-10 NOTE — Telephone Encounter (Signed)
Could you schedule with Padonda for this week? Thanks

## 2012-07-10 NOTE — Patient Instructions (Signed)
Syncope Syncope is a fainting spell. This means the person loses consciousness and drops to the ground. The person is generally unconscious for less than 5 minutes. The person may have some muscle twitches for up to 15 seconds before waking up and returning to normal. Syncope occurs more often in elderly people, but it can happen to anyone. While most causes of syncope are not dangerous, syncope can be a sign of a serious medical problem. It is important to seek medical care.  CAUSES  Syncope is caused by a sudden decrease in blood flow to the brain. The specific cause is often not determined. Factors that can trigger syncope include:  Taking medicines that lower blood pressure.  Sudden changes in posture, such as standing up suddenly.  Taking more medicine than prescribed.  Standing in one place for too long.  Seizure disorders.  Dehydration and excessive exposure to heat.  Low blood sugar (hypoglycemia).  Straining to have a bowel movement.  Heart disease, irregular heartbeat, or other circulatory problems.  Fear, emotional distress, seeing blood, or severe pain. SYMPTOMS  Right before fainting, you may:  Feel dizzy or lightheaded.  Feel nauseous.  See all white or all black in your field of vision.  Have cold, clammy skin. DIAGNOSIS  Your caregiver will ask about your symptoms, perform a physical exam, and perform electrocardiography (ECG) to record the electrical activity of your heart. Your caregiver may also perform other heart or blood tests to determine the cause of your syncope. TREATMENT  In most cases, no treatment is needed. Depending on the cause of your syncope, your caregiver may recommend changing or stopping some of your medicines. HOME CARE INSTRUCTIONS  Have someone stay with you until you feel stable.  Do not drive, operate machinery, or play sports until your caregiver says it is okay.  Keep all follow-up appointments as directed by your  caregiver.  Lie down right away if you start feeling like you might faint. Breathe deeply and steadily. Wait until all the symptoms have passed.  Drink enough fluids to keep your urine clear or pale yellow.  If you are taking blood pressure or heart medicine, get up slowly, taking several minutes to sit and then stand. This can reduce dizziness. SEEK IMMEDIATE MEDICAL CARE IF:   You have a severe headache.  You have unusual pain in the chest, abdomen, or back.  You are bleeding from the mouth or rectum, or you have black or tarry stool.  You have an irregular or very fast heartbeat.  You have pain with breathing.  You have repeated fainting or seizure-like jerking during an episode.  You faint when sitting or lying down.  You have confusion.  You have difficulty walking.  You have severe weakness.  You have vision problems. If you fainted, call your local emergency services (911 in [place] Do not drive yourself to the hospital.  MAKE SURE YOU:  Understand these instructions.  Will watch your condition.  Will get help right away if you are not doing well or get worse. Document Released: 03/29/2005 Document Revised: 09/28/2011 Document Reviewed: 05/28/2011 [facility] Patient Information 2013 [place], [place].  Try reducing diphenhydramine to 25 mg at night Drink plenty of fluids We will call you regarding other studies including carotid Dopplers and event monitor

## 2012-07-10 NOTE — Telephone Encounter (Signed)
Call patient- she probalby needs OV this week- any provider

## 2012-07-10 NOTE — Telephone Encounter (Signed)
Hi Dr. Cato Mulligan:  The above referenced patient ( my MIL) called me yesterday with C/C:  "I have fallen". HPI:  Had eaten earlier in the morning; got up from a sitting position and moved across the kitchen and bent over to put dishes into the dishwasher.   Bent over AGAIN to place trash beneath the sink cabinet. Lifted the trash from beneath the sink ("quite heavy she noted" [vasovagal response?]) and while lifting the trash out from beneath sink she "came up"-FELT DIZZY and "did a slow collapse/"slither" down to my knees" she states.  (did not use hands or forearms to break fall; fell to her knees (which are ok by physical exam, i.e. no abrasions, no contusions; did not strike her head, etc.). Was able to pull herself up to the edge of the kitchen counter and used the counter as a "crutch" to reach a sitting position at the kitchen table whereupon she called me.   Upon arrival to her home ( away) she was observed to have normal speech, PERRL, UE strength 5/5 and equal bilaterally. States she felt "fuzzy/shaky". (She had eaten, though I gave her some OJ).Blood pressure upon arrival was 149/75 with pulse 65; 30 min later BP = 144/74 pulse = 61. An additional 30 minutes later-- I assessed her for orthostasis:  Lying 150/78 pulse 65; Sitting 151/78 pulse 65; Standing 159/79 pulse 66 (all blood pressures in LEFT arm).   She is on the following medications:  clopidogrel 75mg  1 tablet QD; metoprolol tartrate 50mg  - TAKES  tablet (25mg ) QD; ramipril 5mg  1 capsule QD; atorvastatin 20mg  1 tablet QD; SL NTG PRN (all of these were prescribed by a [facility] except atorvastatin-you prescribed). Regarding the NTG-She DENIED any chest pain, dyspnea, diaphoresis or n/v while I was there or preceding my arrival. She takes her metoprolol as an ANTI-ARYTHMIC (not for blood pressure).  Given the NEW JNC 8 guidelines---the line in the sand for patients >15 years of age has been "bumped UP" (unless they have DM or CKD);  similarly "CCBs and thiazide-type diuretics should be used instead of ACE inhibitors and ARBs in patients over the age of 59 years with impaired kidney function due to the risk of hyperkalemia, increased creatinine, and further renal impairment."    Not sure if a trial of REDUCED metoprolol to 12.5mg  would still give her the required ANTI-ARYTHMIC effect desired. Not sure if there is any latitude in reducing her ACE inhibitor.   She does endorse taking diphenhydramine 50mg  (2x25mg  tablets) of an OTC "sleep aid" which I encouraged her by [employer] to attempt to eliminate or at least reduce to 25mg  HS.   Finally-she indicates she has by mid-morning/frequently "a sense of urgency" to go void. I encouraged her to empty her bladder more frequently to avoid the "got to go suddenly" urge from her bladder. Not sure if the diphenhydramine may be having some component here as well.

## 2012-07-10 NOTE — Progress Notes (Signed)
Subjective:    Patient ID: [name] [name], female    DOB: [birth year], 79 y.o.   MRN: [id number]  HPI Acute visit Patient has problems including history of hyperlipidemia, hypertension, CAD, atrial fibrillation, remote history of uterine cancer, and reported left bundle branch block.  She is seen today following reported syncopal episode which occurred around 10:30 AM this past Sunday morning. She reports she was reading a newspaper while eating breakfast. She bent down to place trash in a cabinet. She recalls feeling very lightheaded and dizzy and slowly went down to her knees. She thinks she had some brief loss of consciousness. When she regained consciousness she was aware of her surroundings. She denies any extremity pain but did have a small bump on her posterior head. She's not had any headaches. No neck pain. No other episodes of recent syncope. No recent chest pains. No dyspnea. No recent orthostatic symptoms. She takes low-dose beta blocker (for arrhythmia and not hypertension) along with other medications which are reviewed. She did not have any particular reasons to be dehydrated.  Patient drank some fluids and felt better afterwards. Her son-in-law is a Teacher, early years/pre and came over and assessed her. She did not have any orthostatic blood pressure changes. Patient does take Benadryl 50 mg at night and this was reduced last night 25 mg.  Past Medical History  Diagnosis Date  . MELANOMA 09/06/2008  . HYPERLIPIDEMIA 10/06/2007  . HYPERTENSION 10/06/2007  . MYOCARDIAL INFARCTION, HX OF 10/06/2007  . CORONARY ARTERY DISEASE 10/06/2007  . LBBB 09/06/2008  . Atrial fibrillation 10/06/2007  . VARICOSE VEIN 09/29/2009  . UTERINE CANCER, HX OF 09/06/2008  . PERSONAL HX COLONIC POLYPS 10/30/2009   Past Surgical History  Procedure Laterality Date  . Oophorectomy    . Right distal pretibeal      melanoma  . Coronary angioplasty with stent placement  2009  . Coronary artery bypass graft  2004  .  Abdominal hysterectomy  1988  . Cardiac catheterization  08/30/2008    reports that she has never smoked. She has never used smokeless tobacco. She reports that  drinks alcohol. Her drug history is not on file. family history includes Heart attack in her father and Stroke in her mother.  There is no history of Colon cancer. Allergies  Allergen Reactions  . Nitroglycerin     REACTION: sensitive too so uses spray       Review of Systems  Constitutional: Negative for fever, chills, appetite change and unexpected weight change.  HENT: Negative for trouble swallowing.   Eyes: Negative for visual disturbance.  Respiratory: Negative for cough and shortness of breath.   Cardiovascular: Negative for chest pain, palpitations and leg swelling.  Gastrointestinal: Negative for nausea, vomiting and abdominal pain.  Genitourinary: Negative for dysuria.  Neurological: Positive for dizziness and syncope. Negative for seizures and weakness.  Hematological: Negative for adenopathy.  Psychiatric/Behavioral: Negative for confusion.       Objective:   Physical Exam  Constitutional: She is oriented to person, place, and time. She appears well-developed and well-nourished.  HENT:  Mouth/Throat: Oropharynx is clear and moist.  Neck: Neck supple. No thyromegaly present.  No carotid bruits  Cardiovascular: Normal rate and regular rhythm.   Pulmonary/Chest: Effort normal and breath sounds normal. No respiratory distress. She has no wheezes. She has no rales.  Musculoskeletal: She exhibits no edema.  Lymphadenopathy:    She has no cervical adenopathy.  Neurological: She is alert and oriented to person, place, and time. She  has normal reflexes. No cranial nerve deficit. She exhibits normal muscle tone. Coordination normal.  Skin: No rash noted.          Assessment & Plan:  Syncopal episode. Question vasovagal. No orthostatic changes by blood pressure today or by recent assessment immediately after  the event. No recent chest pains. Check basic lab work. Repeat carotid Dopplers. Check EKG. Set up event monitor. Reduce nightly dose of Benadryl.  We elected not to change her beta blocker dose at this time since her pulse in stable (64) and no hypotension.  EKG shows LBBB (chronic) with no acute changes.

## 2012-07-11 ENCOUNTER — Observation Stay (HOSPITAL_COMMUNITY)
Admission: EM | Admit: 2012-07-11 | Discharge: 2012-07-14 | DRG: 287 | Disposition: A | Discharge status: Home / Self Care | Payer: Medicare Other | Attending: Family Medicine | Admitting: Family Medicine

## 2012-07-11 ENCOUNTER — Observation Stay (HOSPITAL_COMMUNITY): Payer: Medicare Other

## 2012-07-11 ENCOUNTER — Encounter (HOSPITAL_COMMUNITY): Payer: Self-pay | Admitting: Emergency Medicine

## 2012-07-11 ENCOUNTER — Emergency Department (HOSPITAL_COMMUNITY): Payer: Medicare Other

## 2012-07-11 DIAGNOSIS — I5020 Unspecified systolic (congestive) heart failure: Secondary | ICD-10-CM | POA: Diagnosis present

## 2012-07-11 DIAGNOSIS — I2589 Other forms of chronic ischemic heart disease: Secondary | ICD-10-CM | POA: Diagnosis present

## 2012-07-11 DIAGNOSIS — E785 Hyperlipidemia, unspecified: Secondary | ICD-10-CM

## 2012-07-11 DIAGNOSIS — G459 Transient cerebral ischemic attack, unspecified: Secondary | ICD-10-CM

## 2012-07-11 DIAGNOSIS — I447 Left bundle-branch block, unspecified: Secondary | ICD-10-CM

## 2012-07-11 DIAGNOSIS — Z7902 Long term (current) use of antithrombotics/antiplatelets: Secondary | ICD-10-CM

## 2012-07-11 DIAGNOSIS — Z9861 Coronary angioplasty status: Secondary | ICD-10-CM

## 2012-07-11 DIAGNOSIS — Z860100 Personal history of colon polyps, unspecified: Secondary | ICD-10-CM

## 2012-07-11 DIAGNOSIS — E876 Hypokalemia: Secondary | ICD-10-CM

## 2012-07-11 DIAGNOSIS — I868 Varicose veins of other specified sites: Secondary | ICD-10-CM

## 2012-07-11 DIAGNOSIS — Z79899 Other long term (current) drug therapy: Secondary | ICD-10-CM

## 2012-07-11 DIAGNOSIS — G319 Degenerative disease of nervous system, unspecified: Secondary | ICD-10-CM | POA: Diagnosis present

## 2012-07-11 DIAGNOSIS — Z8249 Family history of ischemic heart disease and other diseases of the circulatory system: Secondary | ICD-10-CM

## 2012-07-11 DIAGNOSIS — Z951 Presence of aortocoronary bypass graft: Secondary | ICD-10-CM

## 2012-07-11 DIAGNOSIS — I4891 Unspecified atrial fibrillation: Secondary | ICD-10-CM

## 2012-07-11 DIAGNOSIS — I2510 Atherosclerotic heart disease of native coronary artery without angina pectoris: Secondary | ICD-10-CM

## 2012-07-11 DIAGNOSIS — I10 Essential (primary) hypertension: Secondary | ICD-10-CM

## 2012-07-11 DIAGNOSIS — Z823 Family history of stroke: Secondary | ICD-10-CM

## 2012-07-11 DIAGNOSIS — C439 Malignant melanoma of skin, unspecified: Secondary | ICD-10-CM

## 2012-07-11 DIAGNOSIS — R55 Syncope and collapse: Principal | ICD-10-CM

## 2012-07-11 DIAGNOSIS — I252 Old myocardial infarction: Secondary | ICD-10-CM

## 2012-07-11 DIAGNOSIS — Z8542 Personal history of malignant neoplasm of other parts of uterus: Secondary | ICD-10-CM

## 2012-07-11 DIAGNOSIS — I255 Ischemic cardiomyopathy: Secondary | ICD-10-CM | POA: Diagnosis present

## 2012-07-11 LAB — HEMOGLOBIN A1C: Mean Plasma Glucose: 117 mg/dL — ABNORMAL HIGH (ref <117)

## 2012-07-11 LAB — CBC
Hemoglobin: 14 g/dL (ref 12.0–15.0)
RBC: 4.71 MIL/uL (ref 3.87–5.11)
WBC: 6 10*3/uL (ref 4.0–10.5)

## 2012-07-11 LAB — BASIC METABOLIC PANEL
CO2: 26 mEq/L (ref 19–32)
Glucose, Bld: 89 mg/dL (ref 70–99)
Potassium: 4 mEq/L (ref 3.5–5.1)
Sodium: 135 mEq/L (ref 135–145)

## 2012-07-11 LAB — PROTIME-INR: INR: 1 (ref 0.00–1.49)

## 2012-07-11 LAB — POCT I-STAT TROPONIN I

## 2012-07-11 LAB — APTT: aPTT: 28 seconds (ref 24–37)

## 2012-07-11 MED ORDER — CLOPIDOGREL BISULFATE 75 MG PO TABS
75.0000 mg | ORAL_TABLET | Freq: Every day | ORAL | Status: DC
  Administered 2012-07-12 – 2012-07-14 (×3): 75 mg via ORAL
  Filled 2012-07-11 (×3): qty 1

## 2012-07-11 MED ORDER — ATORVASTATIN CALCIUM 20 MG PO TABS
20.0000 mg | ORAL_TABLET | Freq: Every day | ORAL | Status: DC
  Administered 2012-07-12 – 2012-07-14 (×3): 20 mg via ORAL
  Filled 2012-07-11 (×4): qty 1

## 2012-07-11 MED ORDER — ENOXAPARIN SODIUM 40 MG/0.4ML ~~LOC~~ SOLN
40.0000 mg | SUBCUTANEOUS | Status: DC
  Administered 2012-07-11 – 2012-07-13 (×3): 40 mg via SUBCUTANEOUS
  Filled 2012-07-11 (×4): qty 0.4

## 2012-07-11 MED ORDER — RAMIPRIL 5 MG PO CAPS
5.0000 mg | ORAL_CAPSULE | Freq: Every day | ORAL | Status: DC
  Administered 2012-07-12 – 2012-07-14 (×3): 5 mg via ORAL
  Filled 2012-07-11 (×4): qty 1

## 2012-07-11 MED ORDER — SODIUM CHLORIDE 0.9 % IV SOLN
1000.0000 mL | INTRAVENOUS | Status: DC
  Administered 2012-07-11: 1000 mL via INTRAVENOUS

## 2012-07-11 MED ORDER — NITROGLYCERIN 0.4 MG/SPRAY TL SOLN: 1.0000 | Status: DC | PRN

## 2012-07-11 MED ORDER — ACETAMINOPHEN 325 MG PO TABS
650.0000 mg | ORAL_TABLET | ORAL | Status: DC | PRN
  Administered 2012-07-12 – 2012-07-14 (×3): 650 mg via ORAL
  Filled 2012-07-11 (×3): qty 2

## 2012-07-11 MED ORDER — NITROGLYCERIN 0.4 MG/SPRAY TL SOLN
1.0000 | Status: DC | PRN
  Filled 2012-07-11: qty 4.9

## 2012-07-11 NOTE — ED Notes (Signed)
Pt remains in xray.

## 2012-07-11 NOTE — Progress Notes (Signed)
Quick Note:  Pt informed on VM ______ 

## 2012-07-11 NOTE — ED Provider Notes (Signed)
History    CSN: [id number] Arrival date & time 07/11/12  1117 First MD Initiated Contact with Patient 07/11/12 1142      Chief Complaint  Patient presents with  . Loss of Consciousness    HPI The patient presents to the emergency room with her second episode of syncope over the last couple of days. The patient had an episode on Sunday.  She went to bend over and then suddenly became dizzy. She then slid and fell to the ground without injuring herself. The patient's daughter is a Publishing rights manager and her son in law is a Teacher, music.  Her son-in-law was able to evaluate her.  Please see the medical records for documentation about that assessment as well as the follow up in the office on Monday. Patient did see Dr. Caryl Never on Monday she was scheduled for outpatient evaluation.  Today however, she  was changing sheets and doing general housework.  She suddenly started to feel dizzy again.  SHe was able to lie down and get to the telephone.  She called her daughter.  SHe was still very lightheaded but did not lose consciousness.  She continues to feel this way and gets worse when she sits up.  Patient was noted to be bradycardic in the 50s. She denies trouble with chest pain, shortness of breath. She denies any abdominal pain. She has not noticed any blood in her stool. She denies any issues with focal numbness or weakness. She has not noticed any vertigo. Past Medical History  Diagnosis Date  . MELANOMA 09/06/2008  . HYPERLIPIDEMIA 10/06/2007  . HYPERTENSION 10/06/2007  . MYOCARDIAL INFARCTION, HX OF 10/06/2007  . CORONARY ARTERY DISEASE 10/06/2007  . LBBB 09/06/2008  . Atrial fibrillation 10/06/2007  . VARICOSE VEIN 09/29/2009  . UTERINE CANCER, HX OF 09/06/2008  . PERSONAL HX COLONIC POLYPS 10/30/2009    Past Surgical History  Procedure Laterality Date  . Oophorectomy    . Right distal pretibeal      melanoma  . Coronary angioplasty with stent placement  2009  . Coronary artery  bypass graft  2004  . Abdominal hysterectomy  1988  . Cardiac catheterization  08/30/2008    Family History  Problem Relation Age of Onset  . Stroke Mother   . Heart attack Father   . Colon cancer Neg Hx     History  Substance Use Topics  . Smoking status: Never Smoker   . Smokeless tobacco: Never Used  . Alcohol Use: Yes    OB History   Grav Para Term Preterm Abortions TAB SAB Ect Mult Living                  Review of Systems  All other systems reviewed and are negative.    Allergies  Nitroglycerin  Home Medications   Current Outpatient Rx  Name  Route  Sig  Dispense  Refill  . amoxicillin (AMOXIL) 500 MG capsule      One hour prior to dental appt   4 capsule   0   . atorvastatin (LIPITOR) 20 MG tablet   Oral   Take 20 mg by mouth daily.         . clopidogrel (PLAVIX) 75 MG tablet   Oral   Take 1 tablet (75 mg total) by mouth daily.   90 tablet   3   . diphenhydrAMINE (SOMINEX) 25 MG tablet   Oral   Take 50 mg by mouth at bedtime.          Marland Kitchen  metoprolol (LOPRESSOR) 50 MG tablet   Oral   Take 0.5 tablets (25 mg total) by mouth daily.   90 tablet   3   . nitroGLYCERIN (NITROLINGUAL) 0.4 MG/SPRAY spray   Sublingual   Place 1 spray under the tongue every 5 (five) minutes as needed.   12 g   8   . ramipril (ALTACE) 5 MG capsule   Oral   Take 1 capsule (5 mg total) by mouth daily.   90 capsule   3     BP 147/58  Pulse 59  Temp(Src) 97.8 F (36.6 C) (Oral)  Resp 17  SpO2 99%  Physical Exam  Nursing note and vitals reviewed. Constitutional: She is oriented to person, place, and time. She appears well-developed and well-nourished. No distress.  HENT:  Head: Normocephalic and atraumatic.  Right Ear: External ear normal.  Left Ear: External ear normal.  Mouth/Throat: Oropharynx is clear and moist.  Eyes: Conjunctivae are normal. Right eye exhibits no discharge. Left eye exhibits no discharge. No scleral icterus.  Neck: Neck  supple. No tracheal deviation present.  Cardiovascular: Normal rate, regular rhythm and intact distal pulses.   Bradycardic  Pulmonary/Chest: Effort normal and breath sounds normal. No stridor. No respiratory distress. She has no wheezes. She has no rales.  Abdominal: Soft. Bowel sounds are normal. She exhibits no distension. There is no tenderness. There is no rebound and no guarding.  Musculoskeletal: She exhibits no edema and no tenderness.  Neurological: She is alert and oriented to person, place, and time. She has normal strength. No sensory deficit. Cranial nerve deficit:  no gross defecits noted. She exhibits normal muscle tone. She displays no seizure activity. Coordination normal.  No pronator drift bilateral upper extrem, able to hold both legs off bed for 5 seconds, sensation intact in all extremities, no visual field cuts, no left or right sided neglect  Skin: Skin is warm and dry. No rash noted.  Psychiatric: She has a normal mood and affect.    ED Course  Procedures (including critical care time) EKG Rate 62 Normal sinus rhythm Left axis deviation Left bundle branch block Abnormal ECG  Labs Reviewed  CBC - Abnormal; Notable for the following:    Platelets 143 (*)    All other components within normal limits  BASIC METABOLIC PANEL - Abnormal; Notable for the following:    GFR calc non Af Amer 78 (*)    All other components within normal limits  PROTIME-INR  APTT  POCT I-STAT TROPONIN I   Dg Chest 2 View  07/11/2012  *RADIOLOGY REPORT*  Clinical Data: Loss of consciousness  CHEST - 2 VIEW  Comparison: Aug 30, 2008.  Findings: Stable mild cardiomegaly.  Sternotomy wires are noted. Surgical clips are noted in left hilar region.  No acute pulmonary disease is noted.  No pneumothorax or pleural effusion is noted.  IMPRESSION: No acute cardiopulmonary abnormality seen.   Original Report Authenticated By: Lupita Raider.,  M.D.     1. Syncope      MDM  Pt has had  recurrent syncopal episodes.  She is currently stable.  She will require cardiac monitoring, further workup, echocardiogram etc.  Will consult with hospitalist service for admission.       Celene Kras, MD 07/11/12 [phone]

## 2012-07-11 NOTE — ED Notes (Signed)
Pt c/o syncope on Sunday with dizziness and similar event today while bending over; pt feels dizzy at present; pt sts seen by PCP yesterday for episode on Sunday; pt denies CP

## 2012-07-11 NOTE — H&P (Signed)
[facility] History and Physical  [name] [name] ZOX:[id number] DOB: 1931/09/17 DOA: 07/11/2012  Referring physician: ED PCP: Judie Petit, MD    Chief Complaint:  Chief Complaint  Patient presents with  . Loss of Consciousness     HPI: [name] [name] is a 79 y.o. female with multiple medical problems, including coronary artery disease, chronic left bundle branch block, hypertension, hyperlipidemia who was brought to the emergency room by the family after she had recurrent episodes of lightheadedness and near syncope. The first episode was 2 days prior to admission and the patient felt lightheaded and then fell hitting her head on the sink. Afterwards she regained consciousness and she was able to function for 24 hours. The patient was still feeling weaker than normal and had to ambulate holding on to the walls. On the day of admission the patient had another episode of extreme dizziness, near syncope, but this time she did not fall and she was able to call for help. Evaluation in emergency room is unremarkable  Review of Systems: The patient denies anorexia, fever, weight loss,, vision loss, decreased hearing, hoarseness, chest pain,  dyspnea on exertion, peripheral edema,hemoptysis, abdominal pain, melena, hematochezia, severe indigestion/heartburn, hematuria, incontinence, genital sores, muscle weakness, suspicious skin lesions, transient blindness, difficulty walking, depression, unusual weight change, abnormal bleeding, enlarged lymph nodes, angioedema, and breast masses.    Past Medical History  Diagnosis Date  . MELANOMA 09/06/2008  . HYPERLIPIDEMIA 10/06/2007  . HYPERTENSION 10/06/2007  . MYOCARDIAL INFARCTION, HX OF 10/06/2007  . CORONARY ARTERY DISEASE 10/06/2007  . LBBB 09/06/2008  . Atrial fibrillation 10/06/2007  . VARICOSE VEIN 09/29/2009  . UTERINE CANCER, HX OF 09/06/2008  . PERSONAL HX COLONIC POLYPS 10/30/2009  . Shortness of breath    Past Surgical History   Procedure Laterality Date  . Oophorectomy    . Right distal pretibeal      melanoma  . Coronary angioplasty with stent placement  2009  . Coronary artery bypass graft  2004  . Abdominal hysterectomy  1988  . Cardiac catheterization  08/30/2008   Social History:  reports that she has never smoked. She has never used smokeless tobacco. She reports that  drinks alcohol. She reports that she does not use illicit drugs. Lives alone at home and is fully independent  Allergies  Allergen Reactions  . Nitroglycerin     REACTION: sensitive to tabs so uses spray    Family History  Problem Relation Age of Onset  . Stroke Mother   . Heart attack Father   . Colon cancer Neg Hx     Prior to Admission medications   Medication Sig Start Date End Date Taking? Authorizing Provider  amoxicillin (AMOXIL) 500 MG capsule One hour prior to dental appt 12/27/11  Yes Bruce Romilda Garret, MD  atorvastatin (LIPITOR) 20 MG tablet Take 20 mg by mouth daily.   Yes Historical Provider, MD  clopidogrel (PLAVIX) 75 MG tablet Take 1 tablet (75 mg total) by mouth daily. 11/16/11  Yes Duke Salvia, MD  diphenhydrAMINE (SOMINEX) 25 MG tablet Take 50 mg by mouth at bedtime.    Yes Historical Provider, MD  metoprolol (LOPRESSOR) 50 MG tablet Take 0.5 tablets (25 mg total) by mouth daily. 11/16/11  Yes Duke Salvia, MD  nitroGLYCERIN (NITROLINGUAL) 0.4 MG/SPRAY spray Place 1 spray under the tongue every 5 (five) minutes as needed. 12/23/10  Yes Gaylord Shih, MD  ramipril (ALTACE) 5 MG capsule Take 1 capsule (5 mg total) by mouth daily.  11/16/11  Yes Duke Salvia, MD   Physical Exam: Filed Vitals:   07/11/12 1300 07/11/12 1330 07/11/12 1456 07/11/12 1514  BP: 151/74 163/68 153/70   Pulse: 64 65 66   Temp:   97.6 F (36.4 C)   TempSrc:   Oral   Resp: 15 18    Weight:    65.318 kg (144 lb)  SpO2: 97% 98% 100%      General:  Alert and oriented x3  Eyes: Pupil equal round react to light accommodation, extraocular  movement is intact  ENT: Clear pharynx without exudates  Neck: No jugular venous distention  Cardiovascular: Regular rate and rhythm without murmurs rubs or gallops  Respiratory: Clear to auscultation bilaterally without wheeze rhonchi crackles  Abdomen: Soft nontender nondistended bowel sounds are present  Skin: No suspicious looking rashes  Musculoskeletal: Intact  Psychiatric: Euthymic  Neurologic: Cranial nerves 2-12 intact, strength 5 out of 5 in 04 extremities, sensation intact. NIH stroke scale is zero  Labs on Admission:  Basic Metabolic Panel:  Recent Labs Lab 07/10/12 1451 07/11/12 1123  NA 133* 135  K 3.8 4.0  CL 97 98  CO2 28 26  GLUCOSE 107* 89  BUN 11 12  CREATININE 0.8 0.79  CALCIUM 8.7 9.5   Liver Function Tests: No results found for this basename: AST, ALT, ALKPHOS, BILITOT, PROT, ALBUMIN,  in the last 168 hours No results found for this basename: LIPASE, AMYLASE,  in the last 168 hours No results found for this basename: AMMONIA,  in the last 168 hours CBC:  Recent Labs Lab 07/10/12 1451 07/11/12 1123  WBC 5.8 6.0  NEUTROABS 3.7  --   HGB 13.2 14.0  HCT 39.3 40.3  MCV 89.3 85.6  PLT 152.0 143*   Cardiac Enzymes: No results found for this basename: CKTOTAL, CKMB, CKMBINDEX, TROPONINI,  in the last 168 hours  BNP (last 3 results) No results found for this basename: PROBNP,  in the last 8760 hours CBG: No results found for this basename: GLUCAP,  in the last 168 hours  Radiological Exams on Admission: Dg Chest 2 View  07/11/2012  *RADIOLOGY REPORT*  Clinical Data: Loss of consciousness  CHEST - 2 VIEW  Comparison: Aug 30, 2008.  Findings: Stable mild cardiomegaly.  Sternotomy wires are noted. Surgical clips are noted in left hilar region.  No acute pulmonary disease is noted.  No pneumothorax or pleural effusion is noted.  IMPRESSION: No acute cardiopulmonary abnormality seen.   Original Report Authenticated By: Lupita Raider.,  M.D.     Ct Head Without Contrast  07/11/2012  *RADIOLOGY REPORT*  Clinical Data: Loss of consciousness.  CT HEAD WITHOUT CONTRAST  Technique:  Contiguous axial images were obtained from the base of the skull through the vertex without contrast.  Comparison: CT 08/30/2008  Findings: Generalized atrophy, stable.  Chronic microvascular ischemic changes in the white matter, with progression.  Negative for acute infarct.  Negative for hemorrhage or mass lesion. Calvarium intact.  IMPRESSION: Atrophy and chronic microvascular ischemia.  No acute abnormality.   Original Report Authenticated By: Janeece Riggers, M.D.     EKG: Independently reviewed. No sinus rhythm with left bundle branch block  Assessment/Plan Principal Problem:   TIA (transient ischemic attack) Active Problems:   HYPERLIPIDEMIA   HYPERTENSION   MYOCARDIAL INFARCTION, HX OF   CORONARY ARTERY DISEASE   LBBB   Syncope   1. Episodes of dizziness and loss of consciousness - I suspect related to cerebrovascular events.  We will pursue a full workup including head CT, MRI of the brain, MRA, carotid Dopplers. We will continue Plavix 2. History of left bundle branch block and myocardial infarction-patient will be kept on telemetry, will check cardiac enzymes, check an echocardiogram Further plans of care depending on the results of the initial workup   Code Status: full code  Family Communication: daughter  Disposition Plan: home     Carlitos Bottino [facility] Pager [phone]  If 7PM-7AM, please contact night-coverage [email or web address] Password [email or web address] 07/11/2012, 5:01 PM

## 2012-07-11 NOTE — ED Notes (Signed)
Attempted report, RN unavailable.

## 2012-07-12 ENCOUNTER — Observation Stay (HOSPITAL_COMMUNITY): Payer: Medicare Other

## 2012-07-12 ENCOUNTER — Encounter (HOSPITAL_COMMUNITY): Payer: Medicare Other

## 2012-07-12 DIAGNOSIS — I359 Nonrheumatic aortic valve disorder, unspecified: Secondary | ICD-10-CM

## 2012-07-12 DIAGNOSIS — I10 Essential (primary) hypertension: Secondary | ICD-10-CM

## 2012-07-12 DIAGNOSIS — I447 Left bundle-branch block, unspecified: Secondary | ICD-10-CM

## 2012-07-12 DIAGNOSIS — I252 Old myocardial infarction: Secondary | ICD-10-CM

## 2012-07-12 DIAGNOSIS — E785 Hyperlipidemia, unspecified: Secondary | ICD-10-CM

## 2012-07-12 DIAGNOSIS — R55 Syncope and collapse: Secondary | ICD-10-CM

## 2012-07-12 DIAGNOSIS — G459 Transient cerebral ischemic attack, unspecified: Secondary | ICD-10-CM

## 2012-07-12 LAB — GLUCOSE, CAPILLARY
Glucose-Capillary: 96 mg/dL (ref 70–99)
Glucose-Capillary: 108 mg/dL — ABNORMAL HIGH (ref 70–99)
Glucose-Capillary: 105 mg/dL — ABNORMAL HIGH (ref 70–99)

## 2012-07-12 LAB — LIPID PANEL
Cholesterol: 145 mg/dL (ref 0–200)
Total CHOL/HDL Ratio: 2.8 RATIO
VLDL: 15 mg/dL (ref 0–40)

## 2012-07-12 MED ORDER — SODIUM CHLORIDE 0.9 % IV SOLN
1.0000 mL/kg/h | INTRAVENOUS | Status: DC
Start: 2012-07-13 — End: 2012-07-13
  Administered 2012-07-13: 1 mL/kg/h via INTRAVENOUS

## 2012-07-12 MED ORDER — ASPIRIN 81 MG PO CHEW
324.0000 mg | CHEWABLE_TABLET | ORAL | Status: AC
  Administered 2012-07-13: 324 mg via ORAL
  Filled 2012-07-12: qty 4

## 2012-07-12 MED ORDER — DIPHENHYDRAMINE HCL 50 MG/ML IJ SOLN: 12.5000 mg | Freq: Once | INTRAMUSCULAR | Status: DC

## 2012-07-12 MED ORDER — SALINE SPRAY 0.65 % NA SOLN
1.0000 | NASAL | Status: DC | PRN
  Administered 2012-07-12: 1 via NASAL
  Filled 2012-07-12: qty 44

## 2012-07-12 MED ORDER — DIPHENHYDRAMINE HCL 50 MG/ML IJ SOLN
12.5000 mg | Freq: Once | INTRAMUSCULAR | Status: AC
  Administered 2012-07-12: 12.5 mg via INTRAVENOUS
  Filled 2012-07-12: qty 1

## 2012-07-12 MED ORDER — SODIUM CHLORIDE 0.9 % IV SOLN: INTRAVENOUS | Status: DC

## 2012-07-12 MED ORDER — LORATADINE 10 MG PO TABS
10.0000 mg | ORAL_TABLET | Freq: Every day | ORAL | Status: DC
  Administered 2012-07-12 – 2012-07-14 (×3): 10 mg via ORAL
  Filled 2012-07-12 (×3): qty 1

## 2012-07-12 NOTE — Progress Notes (Signed)
[facility] PROGRESS NOTE  Bobbye Petti ZOX:[id number] DOB: 12/11/1931 DOA: 07/11/2012 PCP: Judie Petit, MD  Assessment/Plan: 1. Syncope - work up so far negative including orthostatic vitals ([place] they were obtained likely after patient had IVF rehydration). - Agree that this is most likely postural and or 2ary to depleted intravascular volume - Agree with holding metoprolol at this juncture - Cardiology on board - no red flags reported on telemetry monitoring. - EEG pending.  2. CAD - h/o bypass surgery in [facility] with patent grafts by cardiac catheterization in 2010 with normal left ventricular systolic function of 60% on recent echocardiogram was noticed to have a change in EF of 25-30 percent. As such cardiology consulted and considering further work up. - Will f/u with their recommendations. - continue plavix and lipitor  3. HPL - continue lipitor  4. HTN - patient on ramipril   Code Status: full Family Communication: Discussed with patient and family member.  Disposition Plan: Pending further evaluation from specialist   Consultants:  Cardiology  Procedures:  EEG  Antibiotics:  none  HPI/Subjective: No new complaints. No acute issues reported overnight.  Objective: Filed Vitals:   07/12/12 0400 07/12/12 0743 07/12/12 1200 07/12/12 1332  BP: 133/66 155/72 165/71 147/66  Pulse: 76 72 78 83  Temp: 98.2 F (36.8 C) 98 F (36.7 C) 97.9 F (36.6 C) 98.1 F (36.7 C)  TempSrc: Oral Oral Oral Oral  Resp:  18 14 16   Weight:      SpO2: 97% 98% 99% 98%    Intake/Output Summary (Last 24 hours) at 07/12/12 1548 Last data filed at 07/12/12 1234  Gross per 24 hour  Intake    720 ml  Output      0 ml  Net    720 ml   Filed Weights   07/11/12 1514  Weight: 65.318 kg (144 lb)    Exam:   General:  Pt in NAD, Alert and Awake  Cardiovascular: normal S1 and S2, no mumurs  Respiratory: CTA BL, no wheezes  Abdomen: soft, NT,  ND  Musculoskeletal: no cyanosis or clubbing   Data Reviewed: Basic Metabolic Panel:  Recent Labs Lab 07/10/12 1451 07/11/12 1123  NA 133* 135  K 3.8 4.0  CL 97 98  CO2 28 26  GLUCOSE 107* 89  BUN 11 12  CREATININE 0.8 0.79  CALCIUM 8.7 9.5   Liver Function Tests: No results found for this basename: AST, ALT, ALKPHOS, BILITOT, PROT, ALBUMIN,  in the last 168 hours No results found for this basename: LIPASE, AMYLASE,  in the last 168 hours No results found for this basename: AMMONIA,  in the last 168 hours CBC:  Recent Labs Lab 07/10/12 1451 07/11/12 1123  WBC 5.8 6.0  NEUTROABS 3.7  --   HGB 13.2 14.0  HCT 39.3 40.3  MCV 89.3 85.6  PLT 152.0 143*   Cardiac Enzymes: No results found for this basename: CKTOTAL, CKMB, CKMBINDEX, TROPONINI,  in the last 168 hours BNP (last 3 results) No results found for this basename: PROBNP,  in the last 8760 hours CBG:  Recent Labs Lab 07/12/12 0830 07/12/12 1202  GLUCAP 92 96    No results found for this or any previous visit (from the past 240 hour(s)).   Studies: Dg Chest 2 View  07/11/2012  *RADIOLOGY REPORT*  Clinical Data: Loss of consciousness  CHEST - 2 VIEW  Comparison: Aug 30, 2008.  Findings: Stable mild cardiomegaly.  Sternotomy wires are noted. Surgical clips  are noted in left hilar region.  No acute pulmonary disease is noted.  No pneumothorax or pleural effusion is noted.  IMPRESSION: No acute cardiopulmonary abnormality seen.   Original Report Authenticated By: Lupita Raider.,  M.D.    Ct Head Without Contrast  07/11/2012  *RADIOLOGY REPORT*  Clinical Data: Loss of consciousness.  CT HEAD WITHOUT CONTRAST  Technique:  Contiguous axial images were obtained from the base of the skull through the vertex without contrast.  Comparison: CT 08/30/2008  Findings: Generalized atrophy, stable.  Chronic microvascular ischemic changes in the white matter, with progression.  Negative for acute infarct.  Negative for  hemorrhage or mass lesion. Calvarium intact.  IMPRESSION: Atrophy and chronic microvascular ischemia.  No acute abnormality.   Original Report Authenticated By: Janeece Riggers, M.D.    Mri Brain Without Contrast  07/11/2012  *RADIOLOGY REPORT*  Clinical Data:  Several episodes of syncope over past few days. History of coronary artery disease and hypertension.  Dizziness.  MRI BRAIN WITHOUT CONTRAST MRA HEAD WITHOUT CONTRAST  Technique: Multiplanar, multiecho pulse sequences of the brain and surrounding structures were obtained according to standard protocol without intravenous contrast.  Angiographic images of the head were obtained using MRA technique without contrast.  Comparison: Fall 05/01/2012 head CT.  10/11/2007 brain MR.  MRI HEAD  Findings:  No acute infarct.  Prominent small vessel disease type changes.  Global atrophy without hydrocephalus.  No intracranial hemorrhage.  Right frontal region sub centimeter calcified structure may represent a small meningioma and is without change.  No associate mass effect.  Cervical medullary junction, pituitary region, pineal region and orbital structures unremarkable.  Cervical spondylotic changes with spinal stenosis and cord flattening most prominent C4-5 level.  IMPRESSION: No acute infarct.  Prominent small vessel disease type changes.  Global atrophy without hydrocephalus.  Right frontal region sub centimeter calcified structure may represent a small meningioma and is without change.  No associate mass effect.  Cervical spondylotic changes with spinal stenosis and cord flattening most prominent C4-5 level.  MRA HEAD  Findings: Anterior circulation without medium or large size vessel significant stenosis or occlusion.  Middle cerebral artery branch vessel irregularity and mild narrowing bilaterally.  Left vertebral artery is diminutive in size after the takeoff of the left PICA.  The right vertebral artery is dominant and ectatic.  Mild irregularity of the PICA  bilaterally.  No significant stenosis of the basilar artery.  Nonvisualization AICAs.  Mild to moderate narrowing proximal left superior cerebellar artery.  No aneurysm or vascular malformation noted.  IMPRESSION: Left vertebral artery is diminutive in size after the takeoff of the left PICA.  The right vertebral artery is dominant and ectatic.  No significant stenosis of the basilar artery.  Anterior circulation without medium or large size vessel significant stenosis or occlusion.  Branch vessel irregularity as noted above.   Original Report Authenticated By: Lacy Duverney, M.D.    Mr Mra Head/brain Wo Cm  07/11/2012  *RADIOLOGY REPORT*  Clinical Data:  Several episodes of syncope over past few days. History of coronary artery disease and hypertension.  Dizziness.  MRI BRAIN WITHOUT CONTRAST MRA HEAD WITHOUT CONTRAST  Technique: Multiplanar, multiecho pulse sequences of the brain and surrounding structures were obtained according to standard protocol without intravenous contrast.  Angiographic images of the head were obtained using MRA technique without contrast.  Comparison: Fall 05/01/2012 head CT.  10/11/2007 brain MR.  MRI HEAD  Findings:  No acute infarct.  Prominent small vessel disease type  changes.  Global atrophy without hydrocephalus.  No intracranial hemorrhage.  Right frontal region sub centimeter calcified structure may represent a small meningioma and is without change.  No associate mass effect.  Cervical medullary junction, pituitary region, pineal region and orbital structures unremarkable.  Cervical spondylotic changes with spinal stenosis and cord flattening most prominent C4-5 level.  IMPRESSION: No acute infarct.  Prominent small vessel disease type changes.  Global atrophy without hydrocephalus.  Right frontal region sub centimeter calcified structure may represent a small meningioma and is without change.  No associate mass effect.  Cervical spondylotic changes with spinal stenosis and cord  flattening most prominent C4-5 level.  MRA HEAD  Findings: Anterior circulation without medium or large size vessel significant stenosis or occlusion.  Middle cerebral artery branch vessel irregularity and mild narrowing bilaterally.  Left vertebral artery is diminutive in size after the takeoff of the left PICA.  The right vertebral artery is dominant and ectatic.  Mild irregularity of the PICA bilaterally.  No significant stenosis of the basilar artery.  Nonvisualization AICAs.  Mild to moderate narrowing proximal left superior cerebellar artery.  No aneurysm or vascular malformation noted.  IMPRESSION: Left vertebral artery is diminutive in size after the takeoff of the left PICA.  The right vertebral artery is dominant and ectatic.  No significant stenosis of the basilar artery.  Anterior circulation without medium or large size vessel significant stenosis or occlusion.  Branch vessel irregularity as noted above.   Original Report Authenticated By: Lacy Duverney, M.D.     Scheduled Meds: . [START ON 07/13/2012] aspirin  324 mg Oral Pre-Cath  . atorvastatin  20 mg Oral Daily  . clopidogrel  75 mg Oral Q breakfast  . enoxaparin (LOVENOX) injection  40 mg Subcutaneous Q24H  . loratadine  10 mg Oral Daily  . ramipril  5 mg Oral Daily   Continuous Infusions: . [START ON 07/13/2012] sodium chloride      Principal Problem:   TIA (transient ischemic attack) Active Problems:   HYPERLIPIDEMIA   HYPERTENSION   MYOCARDIAL INFARCTION, HX OF   CORONARY ARTERY DISEASE   LBBB   Syncope    Time spent: > 35 minutes    Penny Pia  [facility] Pager [phone]. If 7PM-7AM, please contact night-coverage at [email or web address], password [facility] 07/12/2012, 3:48 PM  LOS: 1 day

## 2012-07-12 NOTE — Progress Notes (Signed)
  Echocardiogram 2D Echocardiogram has been performed.  Ludella Pranger 07/12/2012, 10:26 AM

## 2012-07-12 NOTE — Progress Notes (Signed)
Patient ID: [name] [name], female   DOB: 10/23/1931, 79 y.o.   MRN: [id number]   CARDIOLOGY CONSULT NOTE  Patient ID: [name] [name] MRN: [id number], DOB/AGE: 1931-04-17   Admit date: 07/11/2012 Date of Consult: 07/12/2012   Primary Physician: Judie Petit, MD Primary Cardiologist: Juanito Doom MD  Pt. Profile  [name] [name] 79 year old widowed [place] female, patient of mine, who was admitted with presyncope x2. I was asked to see her by Dr. Cena Benton for an abnormal echocardiogram.  Problem List  Past Medical History  Diagnosis Date  . MELANOMA 09/06/2008  . HYPERLIPIDEMIA 10/06/2007  . HYPERTENSION 10/06/2007  . MYOCARDIAL INFARCTION, HX OF 10/06/2007  . CORONARY ARTERY DISEASE 10/06/2007  . LBBB 09/06/2008  . Atrial fibrillation 10/06/2007  . VARICOSE VEIN 09/29/2009  . UTERINE CANCER, HX OF 09/06/2008  . PERSONAL HX COLONIC POLYPS 10/30/2009  . Shortness of breath     Past Surgical History  Procedure Laterality Date  . Oophorectomy    . Right distal pretibeal      melanoma  . Coronary angioplasty with stent placement  2009  . Coronary artery bypass graft  2004  . Abdominal hysterectomy  1988  . Cardiac catheterization  08/30/2008     Allergies  Allergies  Allergen Reactions  . Nitroglycerin     REACTION: sensitive to tabs so uses spray    HPI   She developed a presyncopal spell on Sunday after bending over. She was not orthostatic that day or hypertensive per her son-in-law. This occurred again on  Tuesday. This was after she had made the bed and again was bending over and stood up. She also felt lightheaded sitting in the car coming to the emergency room. Laying down relieved the symptoms.  Her workup has been negative. Specifically, she's had a negative CT scan and MRI except for extensive microvascular disease and some atrophy. Blood work was unremarkable and she did not appear to be too dehydrated based on her BUN and creatinine. Chest x-ray was unremarkable. Carotid Doppler  showed no significant vascular disease. She had antegrade flow in both vertebrals.   Echocardiogram showed a remarkable decrease in left ventricular function since her cardiac catheterization in 2010. Her ejection fraction is 25-30% with diffuse hypokinesia. There were no segmental [place] motion abnormalities. She does have a left bundle branch block. Her ejection fraction at time of her catheterization was 60%. Her LIMA was atretic but with good flow down the LAD. Saphenous vein graft to diagonal, obtuse marginal, and right coronary artery were patent.  When you push her by history, she does have some chest tightness and some increased dyspnea on exertion when climbing little hills. She denies orthopnea, PND or edema. She denies any symptoms of an acute coronary syndrome since I saw her in September in the office. Her memory is not as good as he used to be however.  Inpatient Medications  . atorvastatin  20 mg Oral Daily  . clopidogrel  75 mg Oral Q breakfast  . enoxaparin (LOVENOX) injection  40 mg Subcutaneous Q24H  . loratadine  10 mg Oral Daily  . ramipril  5 mg Oral Daily    Family History Family History  Problem Relation Age of Onset  . Stroke Mother   . Heart attack Father   . Colon cancer Neg Hx      Social History History   Social History  . Marital Status: Widowed    Spouse Name: N/A    Number of Children: N/A  . Years of  Education: N/A   Occupational History  . Not on file.   Social History Main Topics  . Smoking status: Never Smoker   . Smokeless tobacco: Never Used  . Alcohol Use: Yes     Comment: daily gin or wine  . Drug Use: No  . Sexually Active: Not on file   Other Topics Concern  . Not on file   Social History Narrative  . No narrative on file     Review of Systems  General:  No chills, fever, night sweats or weight changes.  Cardiovascular:  No chest pain, dyspnea on exertion, edema, orthopnea, palpitations, paroxysmal nocturnal  dyspnea. Dermatological: No rash, lesions/masses Respiratory: No cough, dyspnea Urologic: No hematuria, dysuria Abdominal:   No nausea, vomiting, diarrhea, bright red blood per rectum, melena, or hematemesis Neurologic:  No visual changes, wkns, changes in mental status. All other systems reviewed and are otherwise negative except as noted above.  Physical Exam  Blood pressure 147/66, pulse 83, temperature 98.1 F (36.7 C), temperature source Oral, resp. rate 16, weight 144 lb (65.318 kg), SpO2 98.00%.  General: Pleasant, NAD Psych: Normal affect. Neuro: Alert and oriented X 3. Moves all extremities spontaneously. HEENT: Normal  Neck: Supple without bruits or JVD. Lungs:  Resp regular and unlabored, CTA. Heart: RRR no s3, s4, or murmurs. Abdomen: Soft, non-tender, non-distended, BS + x 4.  Extremities: No clubbing, cyanosis or edema. DP/PT/Radials 2+ and equal bilaterally.  Labs  No results found for this basename: CKTOTAL, CKMB, TROPONINI,  in the last 72 hours Lab Results  Component Value Date   WBC 6.0 07/11/2012   HGB 14.0 07/11/2012   HCT 40.3 07/11/2012   MCV 85.6 07/11/2012   PLT 143* 07/11/2012    Recent Labs Lab 07/11/12 1123  NA 135  K 4.0  CL 98  CO2 26  BUN 12  CREATININE 0.79  CALCIUM 9.5  GLUCOSE 89   Lab Results  Component Value Date   CHOL 145 07/12/2012   HDL 52 07/12/2012   LDLCALC 78 07/12/2012   TRIG 76 07/12/2012   No results found for this basename: DDIMER    Radiology/Studies  Dg Chest 2 View  07/11/2012  *RADIOLOGY REPORT*  Clinical Data: Loss of consciousness  CHEST - 2 VIEW  Comparison: Aug 30, 2008.  Findings: Stable mild cardiomegaly.  Sternotomy wires are noted. Surgical clips are noted in left hilar region.  No acute pulmonary disease is noted.  No pneumothorax or pleural effusion is noted.  IMPRESSION: No acute cardiopulmonary abnormality seen.   Original Report Authenticated By: Lupita Raider.,  M.D.    Ct Head Without Contrast  07/11/2012   *RADIOLOGY REPORT*  Clinical Data: Loss of consciousness.  CT HEAD WITHOUT CONTRAST  Technique:  Contiguous axial images were obtained from the base of the skull through the vertex without contrast.  Comparison: CT 08/30/2008  Findings: Generalized atrophy, stable.  Chronic microvascular ischemic changes in the white matter, with progression.  Negative for acute infarct.  Negative for hemorrhage or mass lesion. Calvarium intact.  IMPRESSION: Atrophy and chronic microvascular ischemia.  No acute abnormality.   Original Report Authenticated By: Janeece Riggers, M.D.    Mri Brain Without Contrast  07/11/2012  *RADIOLOGY REPORT*  Clinical Data:  Several episodes of syncope over past few days. History of coronary artery disease and hypertension.  Dizziness.  MRI BRAIN WITHOUT CONTRAST MRA HEAD WITHOUT CONTRAST  Technique: Multiplanar, multiecho pulse sequences of the brain and surrounding structures were obtained according  to standard protocol without intravenous contrast.  Angiographic images of the head were obtained using MRA technique without contrast.  Comparison: Fall 05/01/2012 head CT.  10/11/2007 brain MR.  MRI HEAD  Findings:  No acute infarct.  Prominent small vessel disease type changes.  Global atrophy without hydrocephalus.  No intracranial hemorrhage.  Right frontal region sub centimeter calcified structure may represent a small meningioma and is without change.  No associate mass effect.  Cervical medullary junction, pituitary region, pineal region and orbital structures unremarkable.  Cervical spondylotic changes with spinal stenosis and cord flattening most prominent C4-5 level.  IMPRESSION: No acute infarct.  Prominent small vessel disease type changes.  Global atrophy without hydrocephalus.  Right frontal region sub centimeter calcified structure may represent a small meningioma and is without change.  No associate mass effect.  Cervical spondylotic changes with spinal stenosis and cord flattening most  prominent C4-5 level.  MRA HEAD  Findings: Anterior circulation without medium or large size vessel significant stenosis or occlusion.  Middle cerebral artery branch vessel irregularity and mild narrowing bilaterally.  Left vertebral artery is diminutive in size after the takeoff of the left PICA.  The right vertebral artery is dominant and ectatic.  Mild irregularity of the PICA bilaterally.  No significant stenosis of the basilar artery.  Nonvisualization AICAs.  Mild to moderate narrowing proximal left superior cerebellar artery.  No aneurysm or vascular malformation noted.  IMPRESSION: Left vertebral artery is diminutive in size after the takeoff of the left PICA.  The right vertebral artery is dominant and ectatic.  No significant stenosis of the basilar artery.  Anterior circulation without medium or large size vessel significant stenosis or occlusion.  Branch vessel irregularity as noted above.   Original Report Authenticated By: Lacy Duverney, M.D.    Mr Mra Head/brain Wo Cm  07/11/2012  *RADIOLOGY REPORT*  Clinical Data:  Several episodes of syncope over past few days. History of coronary artery disease and hypertension.  Dizziness.  MRI BRAIN WITHOUT CONTRAST MRA HEAD WITHOUT CONTRAST  Technique: Multiplanar, multiecho pulse sequences of the brain and surrounding structures were obtained according to standard protocol without intravenous contrast.  Angiographic images of the head were obtained using MRA technique without contrast.  Comparison: Fall 05/01/2012 head CT.  10/11/2007 brain MR.  MRI HEAD  Findings:  No acute infarct.  Prominent small vessel disease type changes.  Global atrophy without hydrocephalus.  No intracranial hemorrhage.  Right frontal region sub centimeter calcified structure may represent a small meningioma and is without change.  No associate mass effect.  Cervical medullary junction, pituitary region, pineal region and orbital structures unremarkable.  Cervical spondylotic changes  with spinal stenosis and cord flattening most prominent C4-5 level.  IMPRESSION: No acute infarct.  Prominent small vessel disease type changes.  Global atrophy without hydrocephalus.  Right frontal region sub centimeter calcified structure may represent a small meningioma and is without change.  No associate mass effect.  Cervical spondylotic changes with spinal stenosis and cord flattening most prominent C4-5 level.  MRA HEAD  Findings: Anterior circulation without medium or large size vessel significant stenosis or occlusion.  Middle cerebral artery branch vessel irregularity and mild narrowing bilaterally.  Left vertebral artery is diminutive in size after the takeoff of the left PICA.  The right vertebral artery is dominant and ectatic.  Mild irregularity of the PICA bilaterally.  No significant stenosis of the basilar artery.  Nonvisualization AICAs.  Mild to moderate narrowing proximal left superior cerebellar artery.  No aneurysm or vascular malformation noted.  IMPRESSION: Left vertebral artery is diminutive in size after the takeoff of the left PICA.  The right vertebral artery is dominant and ectatic.  No significant stenosis of the basilar artery.  Anterior circulation without medium or large size vessel significant stenosis or occlusion.  Branch vessel irregularity as noted above.   Original Report Authenticated By: Lacy Duverney, M.D.     ECG  Normal sinus rhythm, left bundle branch block  ASSESSMENT AND PLAN #1 presyncope most likely related to postural changes and perhaps intravascular volume depletion. #2 ischemic cardiomyopathy with a significant drop in left ventricular function by 2-D echocardiogram since 2010. Her ejection fraction is now 25-30% #3 history of myocardial infarction and bypass surgery in [place], [place]. Patent grafts by cardiac catheterization in 2010 with normal left ventricular systolic function of 60% #4 chronic small vessel disease with cerebral atrophy #5 chronic  left bundle branch block #6 history of postoperative atrial fib, resolved   I've recommended cardiac catheterization. Indication, risk,  potential outcomes discussed with patient. She agrees to proceed. I'll call Chancy Milroy her son in law. Precath orders written and she is put on the cath lab schedule for tomorrow.   Signed, Valera Castle, MD 07/12/2012, 3:03 PM

## 2012-07-12 NOTE — Progress Notes (Signed)
*  PRELIMINARY RESULTS* Vascular Ultrasound Carotid Doppler has been completed.  Preliminary findings: Bilateral:  No evidence of hemodynamically significant internal carotid artery stenosis.   Vertebral artery flow is antegrade.      Farrel Demark, RDMS, RVT  07/12/2012, 10:00 AM

## 2012-07-12 NOTE — Progress Notes (Signed)
EEG completed; results pending.    

## 2012-07-13 ENCOUNTER — Encounter (HOSPITAL_COMMUNITY)
Admission: EM | Disposition: A | Discharge status: Home / Self Care | Payer: Self-pay | Source: Home / Self Care | Attending: Emergency Medicine

## 2012-07-13 DIAGNOSIS — I2510 Atherosclerotic heart disease of native coronary artery without angina pectoris: Secondary | ICD-10-CM

## 2012-07-13 HISTORY — PX: LEFT HEART CATHETERIZATION WITH CORONARY ANGIOGRAM: SHX5451

## 2012-07-13 LAB — GLUCOSE, CAPILLARY: Glucose-Capillary: 131 mg/dL — ABNORMAL HIGH (ref 70–99)

## 2012-07-13 SURGERY — LEFT HEART CATHETERIZATION WITH CORONARY ANGIOGRAM
Anesthesia: Moderate Sedation | Laterality: Bilateral

## 2012-07-13 MED ORDER — MIDAZOLAM HCL 2 MG/2ML IJ SOLN
INTRAMUSCULAR | Status: AC
  Filled 2012-07-13: qty 2

## 2012-07-13 MED ORDER — HEPARIN (PORCINE) IN NACL 2-0.9 UNIT/ML-% IJ SOLN
INTRAMUSCULAR | Status: AC
  Filled 2012-07-13: qty 1000

## 2012-07-13 MED ORDER — OXYCODONE-ACETAMINOPHEN 5-325 MG PO TABS: 1.0000 | ORAL_TABLET | ORAL | Status: DC | PRN

## 2012-07-13 MED ORDER — ONDANSETRON HCL 4 MG/2ML IJ SOLN: 4.0000 mg | Freq: Four times a day (QID) | INTRAMUSCULAR | Status: DC | PRN

## 2012-07-13 MED ORDER — ASPIRIN EC 325 MG PO TBEC
325.0000 mg | DELAYED_RELEASE_TABLET | Freq: Every day | ORAL | Status: DC
  Administered 2012-07-14: 325 mg via ORAL
  Filled 2012-07-13: qty 1

## 2012-07-13 MED ORDER — ACETAMINOPHEN 325 MG PO TABS: 650.0000 mg | ORAL_TABLET | ORAL | Status: DC | PRN

## 2012-07-13 MED ORDER — LIDOCAINE HCL (PF) 1 % IJ SOLN
INTRAMUSCULAR | Status: AC
  Filled 2012-07-13: qty 30

## 2012-07-13 MED ORDER — SODIUM CHLORIDE 0.45 % IV SOLN: INTRAVENOUS | Status: DC

## 2012-07-13 MED ORDER — SODIUM CHLORIDE 0.45 % IV SOLN
INTRAVENOUS | Status: AC
  Administered 2012-07-13: 14:00:00 via INTRAVENOUS

## 2012-07-13 MED ORDER — DOCUSATE SODIUM 100 MG PO CAPS
100.0000 mg | ORAL_CAPSULE | Freq: Two times a day (BID) | ORAL | Status: DC | PRN
  Administered 2012-07-13: 100 mg via ORAL
  Filled 2012-07-13: qty 1

## 2012-07-13 NOTE — Progress Notes (Signed)
[facility] PROGRESS NOTE  [name] [name] WUJ:[id number] DOB: 1932/03/27 DOA: 07/11/2012 PCP: Judie Petit, MD  Assessment/Plan: 1. Syncope - work up so far negative including orthostatic vitals ([place] they were obtained likely after patient had IVF rehydration). - Agree that this is most likely postural and or 2ary to depleted intravascular volume - Agree with holding metoprolol at this juncture - Cardiology on board and patient recently taken to cath for further evaluation given history of decrease in EF from last echocardiogram to the most recent one this admission. - no red flags reported on telemetry monitoring. - EEG interpreted as no evidence of epileptic disorder.  2. CAD - h/o bypass surgery in [facility] with patent grafts by cardiac catheterization in 2010 with normal left ventricular systolic function of 60% on recent echocardiogram was noticed to have a decrease in EF.. As such cardiology consulted and patient taken to cath for further evaluation.  Cash showed no evidence of new ischemic disease. - Will f/u with their recommendations. - continue plavix and lipitor  3. HPL - continue lipitor  4. HTN - patient on ramipril   Code Status: full Family Communication: Discussed with patient and family member.  Disposition Plan: Pending further evaluation from specialist. Will obtain orthostatic next am. Likely d/c in 1-2 days if ok with cardiology   Consultants:  Cardiology  Procedures:  EEG  Antibiotics:  none  HPI/Subjective: No new complaints. No acute issues reported overnight.  Objective: Filed Vitals:   07/13/12 1325 07/13/12 1340 07/13/12 1410 07/13/12 1500  BP: 138/65 147/67 147/80 149/71  Pulse: 74 76 77 81  Temp:      TempSrc:      Resp:      Weight:      SpO2:        Intake/Output Summary (Last 24 hours) at 07/13/12 1504 Last data filed at 07/13/12 1500  Gross per 24 hour  Intake 601.89 ml  Output      0 ml  Net 601.89 ml    Filed Weights   07/11/12 1514  Weight: 65.318 kg (144 lb)    Exam:   General:  Pt in NAD, Alert and Awake  Cardiovascular: normal S1 and S2, no mumurs  Respiratory: CTA BL, no wheezes  Abdomen: soft, NT, ND  Musculoskeletal: no cyanosis or clubbing   Data Reviewed: Basic Metabolic Panel:  Recent Labs Lab 07/10/12 1451 07/11/12 1123  NA 133* 135  K 3.8 4.0  CL 97 98  CO2 28 26  GLUCOSE 107* 89  BUN 11 12  CREATININE 0.8 0.79  CALCIUM 8.7 9.5   Liver Function Tests: No results found for this basename: AST, ALT, ALKPHOS, BILITOT, PROT, ALBUMIN,  in the last 168 hours No results found for this basename: LIPASE, AMYLASE,  in the last 168 hours No results found for this basename: AMMONIA,  in the last 168 hours CBC:  Recent Labs Lab 07/10/12 1451 07/11/12 1123  WBC 5.8 6.0  NEUTROABS 3.7  --   HGB 13.2 14.0  HCT 39.3 40.3  MCV 89.3 85.6  PLT 152.0 143*   Cardiac Enzymes: No results found for this basename: CKTOTAL, CKMB, CKMBINDEX, TROPONINI,  in the last 168 hours BNP (last 3 results) No results found for this basename: PROBNP,  in the last 8760 hours CBG:  Recent Labs Lab 07/12/12 1202 07/12/12 1654 07/12/12 2121 07/13/12 0802 07/13/12 1254  GLUCAP 96 108* 105* 99 85    No results found for this or any previous visit (  from the past 240 hour(s)).   Studies: Ct Head Without Contrast  07/11/2012  *RADIOLOGY REPORT*  Clinical Data: Loss of consciousness.  CT HEAD WITHOUT CONTRAST  Technique:  Contiguous axial images were obtained from the base of the skull through the vertex without contrast.  Comparison: CT 08/30/2008  Findings: Generalized atrophy, stable.  Chronic microvascular ischemic changes in the white matter, with progression.  Negative for acute infarct.  Negative for hemorrhage or mass lesion. Calvarium intact.  IMPRESSION: Atrophy and chronic microvascular ischemia.  No acute abnormality.   Original Report Authenticated By: Janeece Riggers,  M.D.    Mri Brain Without Contrast  07/11/2012  *RADIOLOGY REPORT*  Clinical Data:  Several episodes of syncope over past few days. History of coronary artery disease and hypertension.  Dizziness.  MRI BRAIN WITHOUT CONTRAST MRA HEAD WITHOUT CONTRAST  Technique: Multiplanar, multiecho pulse sequences of the brain and surrounding structures were obtained according to standard protocol without intravenous contrast.  Angiographic images of the head were obtained using MRA technique without contrast.  Comparison: Fall 05/01/2012 head CT.  10/11/2007 brain MR.  MRI HEAD  Findings:  No acute infarct.  Prominent small vessel disease type changes.  Global atrophy without hydrocephalus.  No intracranial hemorrhage.  Right frontal region sub centimeter calcified structure may represent a small meningioma and is without change.  No associate mass effect.  Cervical medullary junction, pituitary region, pineal region and orbital structures unremarkable.  Cervical spondylotic changes with spinal stenosis and cord flattening most prominent C4-5 level.  IMPRESSION: No acute infarct.  Prominent small vessel disease type changes.  Global atrophy without hydrocephalus.  Right frontal region sub centimeter calcified structure may represent a small meningioma and is without change.  No associate mass effect.  Cervical spondylotic changes with spinal stenosis and cord flattening most prominent C4-5 level.  MRA HEAD  Findings: Anterior circulation without medium or large size vessel significant stenosis or occlusion.  Middle cerebral artery branch vessel irregularity and mild narrowing bilaterally.  Left vertebral artery is diminutive in size after the takeoff of the left PICA.  The right vertebral artery is dominant and ectatic.  Mild irregularity of the PICA bilaterally.  No significant stenosis of the basilar artery.  Nonvisualization AICAs.  Mild to moderate narrowing proximal left superior cerebellar artery.  No aneurysm or  vascular malformation noted.  IMPRESSION: Left vertebral artery is diminutive in size after the takeoff of the left PICA.  The right vertebral artery is dominant and ectatic.  No significant stenosis of the basilar artery.  Anterior circulation without medium or large size vessel significant stenosis or occlusion.  Branch vessel irregularity as noted above.   Original Report Authenticated By: Lacy Duverney, M.D.    Mr Mra Head/brain Wo Cm  07/11/2012  *RADIOLOGY REPORT*  Clinical Data:  Several episodes of syncope over past few days. History of coronary artery disease and hypertension.  Dizziness.  MRI BRAIN WITHOUT CONTRAST MRA HEAD WITHOUT CONTRAST  Technique: Multiplanar, multiecho pulse sequences of the brain and surrounding structures were obtained according to standard protocol without intravenous contrast.  Angiographic images of the head were obtained using MRA technique without contrast.  Comparison: Fall 05/01/2012 head CT.  10/11/2007 brain MR.  MRI HEAD  Findings:  No acute infarct.  Prominent small vessel disease type changes.  Global atrophy without hydrocephalus.  No intracranial hemorrhage.  Right frontal region sub centimeter calcified structure may represent a small meningioma and is without change.  No associate mass effect.  Cervical  medullary junction, pituitary region, pineal region and orbital structures unremarkable.  Cervical spondylotic changes with spinal stenosis and cord flattening most prominent C4-5 level.  IMPRESSION: No acute infarct.  Prominent small vessel disease type changes.  Global atrophy without hydrocephalus.  Right frontal region sub centimeter calcified structure may represent a small meningioma and is without change.  No associate mass effect.  Cervical spondylotic changes with spinal stenosis and cord flattening most prominent C4-5 level.  MRA HEAD  Findings: Anterior circulation without medium or large size vessel significant stenosis or occlusion.  Middle cerebral  artery branch vessel irregularity and mild narrowing bilaterally.  Left vertebral artery is diminutive in size after the takeoff of the left PICA.  The right vertebral artery is dominant and ectatic.  Mild irregularity of the PICA bilaterally.  No significant stenosis of the basilar artery.  Nonvisualization AICAs.  Mild to moderate narrowing proximal left superior cerebellar artery.  No aneurysm or vascular malformation noted.  IMPRESSION: Left vertebral artery is diminutive in size after the takeoff of the left PICA.  The right vertebral artery is dominant and ectatic.  No significant stenosis of the basilar artery.  Anterior circulation without medium or large size vessel significant stenosis or occlusion.  Branch vessel irregularity as noted above.   Original Report Authenticated By: Lacy Duverney, M.D.     Scheduled Meds: . [START ON 07/14/2012] aspirin EC  325 mg Oral Daily  . atorvastatin  20 mg Oral Daily  . clopidogrel  75 mg Oral Q breakfast  . enoxaparin (LOVENOX) injection  40 mg Subcutaneous Q24H  . loratadine  10 mg Oral Daily  . ramipril  5 mg Oral Daily   Continuous Infusions: . sodium chloride 35 mL/hr at 07/13/12 1330    Principal Problem:   TIA (transient ischemic attack) Active Problems:   HYPERLIPIDEMIA   HYPERTENSION   MYOCARDIAL INFARCTION, HX OF   CORONARY ARTERY DISEASE   LBBB   Syncope    Time spent: > 35 minutes    Penny Pia  [facility] Pager [phone]. If 7PM-7AM, please contact night-coverage at [email or web address], password [facility] 07/13/2012, 3:04 PM  LOS: 2 days

## 2012-07-13 NOTE — Procedures (Signed)
EEG#:  [id number].  REFERRING PHYSICIAN:  Lonia Blood, M.D.  INDICATION FOR STUDY:  A 79 year old lady with episodes of near-syncope. Study is being performed to rule out possible new onset seizure disorder.  DESCRIPTION:  This is a routine EEG recording performed during wakefulness.  Predominant background activity consisted of low amplitude 1-2 Hz diffuse symmetrical, continuous delta activity with superimposed 8-9 Hz alpha rhythm recorded from the posterior head regions.  Photic stimulation produced a symmetrical occipital driving response. Hyperventilation was not performed.  No epileptiform discharges were recorded.  There were no areas of disproportionate focal slowing.  INTERPRETATION:  This EEG shows minimal generalized, continuous nonspecific slowing of cerebral activity.  No evidence of an epileptic disorder was demonstrated.     Noel Christmas, MD    [id number] D:  07/12/2012 15:54:12  T:  07/13/2012 02:43:38  Job #:  [id number]

## 2012-07-13 NOTE — CV Procedure (Signed)
      Catheterization   Indication:  Syncope with decreased EF  Procedure: After informed consent and clinical "time out" the right groin was prepped and draped in a sterile fashion.  A 5Fr sheath was placed in the right femoral artery using seldinger technique and local lidocaine.  Standard JL4, JR4 and angled pigtail catheters were used to engage the coronary arteries.  Coronary arteries were visualized in orthogonal views using caudal and cranial angulation.  RAO ventriculography was done using 30* cc of contrast.    Medications:   Versed: 2 mg's  Fentanyl: 0 ug's  Coronary Arteries: Right dominant with no anomalies  LM: Normal  LAD: 30% proximal widely patent stent to proximal LAD distal vessel small but fills normally. [place]ima known to be atretic    D1: occluded fills from SVG   Circumflex: 30% proximal   OM1:  normal  OM2: competitive flow from SVG  RCA: 100% mid vessel occlusion   PDA: fills from SVG  PLA: fills from SVG  Bypass Grafts:  LIMA: atretic  SVG: D1 normal  SVG OM2 Normal  SVG distal RCA normal  Ventriculography: EF: 40% %, diffuse hypokinesis  Hemodynamics:  Aortic Pressure: 147 74  mmHg  LV Pressure: 146 2  mmHg  Impression:  No evidence of new ischemic disease Angiogram no different than 2010  LBBB with echo showing moderately to severe decrease in EF Medical Rx is warranted.  Tolerated procedure well Case reviewed with her daughter [name] intensive care unit nurse at [facility] 07/13/2012 12:00 PM

## 2012-07-14 ENCOUNTER — Encounter (HOSPITAL_COMMUNITY): Payer: Medicare Other

## 2012-07-14 ENCOUNTER — Telehealth: Payer: Self-pay | Admitting: *Deleted

## 2012-07-14 DIAGNOSIS — E876 Hypokalemia: Secondary | ICD-10-CM

## 2012-07-14 LAB — BASIC METABOLIC PANEL
CO2: 25 mEq/L (ref 19–32)
Calcium: 8.8 mg/dL (ref 8.4–10.5)
Sodium: 135 mEq/L (ref 135–145)

## 2012-07-14 LAB — GLUCOSE, CAPILLARY: Glucose-Capillary: 94 mg/dL (ref 70–99)

## 2012-07-14 MED ORDER — POTASSIUM CHLORIDE CRYS ER 20 MEQ PO TBCR
40.0000 meq | EXTENDED_RELEASE_TABLET | Freq: Once | ORAL | Status: AC
  Administered 2012-07-14: 40 meq via ORAL
  Filled 2012-07-14: qty 2

## 2012-07-14 MED ORDER — MENTHOL 3 MG MT LOZG
1.0000 | LOZENGE | OROMUCOSAL | Status: DC | PRN
  Filled 2012-07-14: qty 9

## 2012-07-14 NOTE — Progress Notes (Signed)
Patient ID: [name] [name], female   DOB: [birth year], 79 y.o.   MRN: [id number]  Primary Physician: Judie Petit, MD Primary Cardiologist: Juanito Doom MD  Subjective:  No complaints admitted with pre syncope  Problem List  Past Medical History  Diagnosis Date  . MELANOMA 09/06/2008  . HYPERLIPIDEMIA 10/06/2007  . HYPERTENSION 10/06/2007  . MYOCARDIAL INFARCTION, HX OF 10/06/2007  . CORONARY ARTERY DISEASE 10/06/2007  . LBBB 09/06/2008  . Atrial fibrillation 10/06/2007  . VARICOSE VEIN 09/29/2009  . UTERINE CANCER, HX OF 09/06/2008  . PERSONAL HX COLONIC POLYPS 10/30/2009  . Shortness of breath      Allergies  Allergies  Allergen Reactions  . Nitroglycerin     REACTION: sensitive to tabs so uses spray    Inpatient Medications  . aspirin EC  325 mg Oral Daily  . atorvastatin  20 mg Oral Daily  . clopidogrel  75 mg Oral Q breakfast  . enoxaparin (LOVENOX) injection  40 mg Subcutaneous Q24H  . loratadine  10 mg Oral Daily  . potassium chloride  40 mEq Oral Once  . ramipril  5 mg Oral Daily      Physical Exam  Blood pressure 121/63, pulse 90, temperature 98 F (36.7 C), temperature source Oral, resp. rate 20, height 5\' 7"  (1.702 m), weight 140 lb (63.504 kg), SpO2 96.00%.  General: Pleasant, NAD Psych: Normal affect. Neuro: Alert and oriented X 3. Moves all extremities spontaneously. HEENT: Normal  Neck: Supple without bruits or JVD. Lungs:  Resp regular and unlabored, CTA. Heart: RRR no s3, s4, or murmurs. Abdomen: Soft, non-tender, non-distended, BS + x 4.  Extremities: No clubbing, cyanosis or edema. DP/PT/Radials 2+ and equal bilaterally. Cath site A with no hematoma  Labs  No results found for this basename: CKTOTAL, CKMB, TROPONINI,  in the last 72 hours Lab Results  Component Value Date   WBC 6.0 07/11/2012   HGB 14.0 07/11/2012   HCT 40.3 07/11/2012   MCV 85.6 07/11/2012   PLT 143* 07/11/2012     Recent Labs Lab 07/14/12 0513  NA 135  K 3.1*  CL 100  CO2  25  BUN 8  CREATININE 0.72  CALCIUM 8.8  GLUCOSE 107*   Lab Results  Component Value Date   CHOL 145 07/12/2012   HDL 52 07/12/2012   LDLCALC 78 07/12/2012   TRIG 76 07/12/2012   No results found for this basename: DDIMER    Radiology/Studies  Dg Chest 2 View  07/11/2012  *RADIOLOGY REPORT*  Clinical Data: Loss of consciousness  CHEST - 2 VIEW  Comparison: Aug 30, 2008.  Findings: Stable mild cardiomegaly.  Sternotomy wires are noted. Surgical clips are noted in left hilar region.  No acute pulmonary disease is noted.  No pneumothorax or pleural effusion is noted.  IMPRESSION: No acute cardiopulmonary abnormality seen.   Original Report Authenticated By: Lupita Raider.,  M.D.    Ct Head Without Contrast  07/11/2012  *RADIOLOGY REPORT*  Clinical Data: Loss of consciousness.  CT HEAD WITHOUT CONTRAST  Technique:  Contiguous axial images were obtained from the base of the skull through the vertex without contrast.  Comparison: CT 08/30/2008  Findings: Generalized atrophy, stable.  Chronic microvascular ischemic changes in the white matter, with progression.  Negative for acute infarct.  Negative for hemorrhage or mass lesion. Calvarium intact.  IMPRESSION: Atrophy and chronic microvascular ischemia.  No acute abnormality.   Original Report Authenticated By: Janeece Riggers, M.D.    Mri Brain Without Contrast  07/11/2012  *RADIOLOGY REPORT*  Clinical Data:  Several episodes of syncope over past few days. History of coronary artery disease and hypertension.  Dizziness.  MRI BRAIN WITHOUT CONTRAST MRA HEAD WITHOUT CONTRAST  Technique: Multiplanar, multiecho pulse sequences of the brain and surrounding structures were obtained according to standard protocol without intravenous contrast.  Angiographic images of the head were obtained using MRA technique without contrast.  Comparison: Fall 05/01/2012 head CT.  10/11/2007 brain MR.  MRI HEAD  Findings:  No acute infarct.  Prominent small vessel disease type  changes.  Global atrophy without hydrocephalus.  No intracranial hemorrhage.  Right frontal region sub centimeter calcified structure may represent a small meningioma and is without change.  No associate mass effect.  Cervical medullary junction, pituitary region, pineal region and orbital structures unremarkable.  Cervical spondylotic changes with spinal stenosis and cord flattening most prominent C4-5 level.  IMPRESSION: No acute infarct.  Prominent small vessel disease type changes.  Global atrophy without hydrocephalus.  Right frontal region sub centimeter calcified structure may represent a small meningioma and is without change.  No associate mass effect.  Cervical spondylotic changes with spinal stenosis and cord flattening most prominent C4-5 level.  MRA HEAD  Findings: Anterior circulation without medium or large size vessel significant stenosis or occlusion.  Middle cerebral artery branch vessel irregularity and mild narrowing bilaterally.  Left vertebral artery is diminutive in size after the takeoff of the left PICA.  The right vertebral artery is dominant and ectatic.  Mild irregularity of the PICA bilaterally.  No significant stenosis of the basilar artery.  Nonvisualization AICAs.  Mild to moderate narrowing proximal left superior cerebellar artery.  No aneurysm or vascular malformation noted.  IMPRESSION: Left vertebral artery is diminutive in size after the takeoff of the left PICA.  The right vertebral artery is dominant and ectatic.  No significant stenosis of the basilar artery.  Anterior circulation without medium or large size vessel significant stenosis or occlusion.  Branch vessel irregularity as noted above.   Original Report Authenticated By: Lacy Duverney, M.D.    Mr Mra Head/brain Wo Cm  07/11/2012  *RADIOLOGY REPORT*  Clinical Data:  Several episodes of syncope over past few days. History of coronary artery disease and hypertension.  Dizziness.  MRI BRAIN WITHOUT CONTRAST MRA HEAD  WITHOUT CONTRAST  Technique: Multiplanar, multiecho pulse sequences of the brain and surrounding structures were obtained according to standard protocol without intravenous contrast.  Angiographic images of the head were obtained using MRA technique without contrast.  Comparison: Fall 05/01/2012 head CT.  10/11/2007 brain MR.  MRI HEAD  Findings:  No acute infarct.  Prominent small vessel disease type changes.  Global atrophy without hydrocephalus.  No intracranial hemorrhage.  Right frontal region sub centimeter calcified structure may represent a small meningioma and is without change.  No associate mass effect.  Cervical medullary junction, pituitary region, pineal region and orbital structures unremarkable.  Cervical spondylotic changes with spinal stenosis and cord flattening most prominent C4-5 level.  IMPRESSION: No acute infarct.  Prominent small vessel disease type changes.  Global atrophy without hydrocephalus.  Right frontal region sub centimeter calcified structure may represent a small meningioma and is without change.  No associate mass effect.  Cervical spondylotic changes with spinal stenosis and cord flattening most prominent C4-5 level.  MRA HEAD  Findings: Anterior circulation without medium or large size vessel significant stenosis or occlusion.  Middle cerebral artery branch vessel irregularity and mild narrowing bilaterally.  Left vertebral artery  is diminutive in size after the takeoff of the left PICA.  The right vertebral artery is dominant and ectatic.  Mild irregularity of the PICA bilaterally.  No significant stenosis of the basilar artery.  Nonvisualization AICAs.  Mild to moderate narrowing proximal left superior cerebellar artery.  No aneurysm or vascular malformation noted.  IMPRESSION: Left vertebral artery is diminutive in size after the takeoff of the left PICA.  The right vertebral artery is dominant and ectatic.  No significant stenosis of the basilar artery.  Anterior  circulation without medium or large size vessel significant stenosis or occlusion.  Branch vessel irregularity as noted above.   Original Report Authenticated By: Lacy Duverney, M.D.     ECG  Normal sinus rhythm, left bundle branch block  ASSESSMENT AND PLAN #1 presyncope most likely related to postural changes and perhaps intravascular volume depletion. No evidence of MI or high grade AV block #2 ischemic cardiomyopathy with a significant drop in left ventricular function by 2-D echocardiogram since 2010. Looked a bit better on cath 40%  Consider F/u MRI in 3 months to quantify continue ACE  EDP low at cath and does not appear to need diuretic #3 history of myocardial infarction and bypass surgery in [place], [place]. Patent grafts by cath yesterday #4 chronic small vessel disease with cerebral atrophy #5 chronic left bundle branch block no high grade block stable on telemetry  #6 history of postoperative atrial fib, resolved   No further cardiac w/u planned will arrange f/u with Dr Daleen Squibb in office   Signed, Charlton Haws, MD 07/14/2012, 8:09 AM

## 2012-07-14 NOTE — Telephone Encounter (Signed)
No Show for event monitor 07/14/12 TK

## 2012-07-14 NOTE — Evaluation (Signed)
Physical Therapy Evaluation Patient Details Name: [name] [name] MRN: [id number] DOB: [birth year] Today's Date: 07/14/2012 Time: 4098-1191 PT Time Calculation (min): 46 min  PT Assessment / Plan / Recommendation Clinical Impression  79 y.o. female with h/o CAD, BBB, MI, CABG 2004 admitted with with multiple episodes of lightheadedness. Prior to admission pt was independent with mobility. She now requires BUE support when walking to maintain balance. She ambulated 350' with RW without LOB, but notes increased lightheadedness with head turns. She notes lightheadedness with positional changes which she states hasn't improved since admission. Vestibular eval to be done this afternoon to r/o inner ear issues.     PT Assessment  Patient needs continued PT services    Follow Up Recommendations  Outpatient PT;Home health PT (depending on results of vestibular eval)    Does the patient have the potential to tolerate intense rehabilitation      Barriers to Discharge None      Equipment Recommendations  None recommended by PT    Recommendations for Other Services     Frequency Min 3X/week    Precautions / Restrictions Precautions Precautions: Fall Restrictions Weight Bearing Restrictions: No   Pertinent Vitals/Pain **Supine HR 78 BP 128/70    Sitting HR 84 BP 128/59 Standing HR 106 BP 113/65  Pt reports lightheadedness in sitting and more so in standing. *      Mobility  Bed Mobility Bed Mobility: Not assessed Transfers Transfers: Sit to Stand;Stand to Sit Sit to Stand: From chair/3-in-1;6: Modified independent (Device/Increase time);With upper extremity assist;With armrests Stand to Sit: 6: Modified independent (Device/Increase time);To chair/3-in-1;Without upper extremity assist;With armrests Ambulation/Gait Ambulation/Gait Assistance: 5: Supervision Ambulation Distance (Feet): 300 Feet Assistive device: Rolling walker Gait Pattern: Within Functional Limits General Gait  Details: pt notes increased lightheadedness with head turns when walking, no LOB with walking with RW    Exercises     PT Diagnosis: Difficulty walking  PT Problem List: Decreased mobility PT Treatment Interventions: Gait training;Balance training;Functional mobility training;Patient/family education   PT Goals Acute Rehab PT Goals PT Goal Formulation: With patient Time For Goal Achievement: 07/28/12 Potential to Achieve Goals: Good Pt will Ambulate: >150 feet;with modified independence;with least restrictive assistive device PT Goal: Ambulate - Progress: Goal set today  Visit Information  Last PT Received On: 07/14/12 Assistance Needed: +1    Subjective Data  Subjective: I have a balance problem. I have to hold onto things when I walk.  Patient Stated Goal: decrease lightheadedness.    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, advertising Overall Cognitive Status: Appears within functional limits for tasks assessed/performed Arousal/Alertness: Awake/alert Orientation Level: Appears intact for tasks assessed Behavior During Session: [facility] for tasks performed    Extremity/Trunk Assessment Right Upper Extremity Assessment RUE ROM/Strength/Tone: Within functional levels Left Upper Extremity Assessment LUE ROM/Strength/Tone: Within functional levels Right Lower Extremity Assessment RLE ROM/Strength/Tone: Within functional levels RLE Sensation: WFL - Light Touch RLE  Coordination: WFL - gross/fine motor Left Lower Extremity Assessment LLE ROM/Strength/Tone: Within functional levels LLE Sensation: WFL - Light Touch LLE Coordination: WFL -  gross/fine motor   Balance Balance Balance Assessed: Yes Static Standing Balance Static Standing - Balance Support: No upper extremity supported Static Standing - Level of Assistance: 5: Stand by assistance Static Standing - Comment/# of Minutes: 2 High Level Balance High Level Balance Comments: pt able to stand with eyes closed 10 sec, reach forward over BOS, turn 360*, and stand with feet together without LOB but notes increased effort to maintain balance  End of Session PT - End of Session Activity Tolerance: Patient tolerated treatment well Patient left: in chair;with call bell/phone within reach Nurse Communication: Mobility status  GP     Ralene Bathe Kistler 07/14/2012, 11:13 AM [phone]

## 2012-07-14 NOTE — Progress Notes (Signed)
Physical Therapy Treatment Patient Details Name: [name] [name] MRN: [id number] DOB: 09/30/1931 Today's Date: 07/14/2012 Time: 4098-1191 PT Time Calculation (min): 52 min  PT Assessment / Plan / Recommendation Comments on Treatment Session  Pt s/p vertigo secondary to right hypofunction with decr mobility secondary to decr endurance, incr dizziness with ambulation and decr balance.  Initiated x1 exercises with pt.  Called Dr. Cena Benton and let him know that pt can f/u with Outpt PT for vestibular rehab and needs a prescription for vestibular rehab.  Pt should progress well.      Follow Up Recommendations  Outpatient PT for vestibular rehab                 Equipment Recommendations  None recommended by PT        Frequency Min 3X/week   Plan Discharge plan remains appropriate;Frequency remains appropriate    Precautions / Restrictions Precautions Precautions: Fall Restrictions Weight Bearing Restrictions: No   Pertinent Vitals/Pain VSS, No pain    Mobility  Bed Mobility Bed Mobility: Rolling Right;Rolling Left;Supine to Sit Rolling Right: 7: Independent Rolling Left: 7: Independent Supine to Sit: 7: Independent Details for Bed Mobility Assistance: Tested rolling right and left with 2/10 dizziness rolling right and 0/10 rolling left.  Modified hallpike right negative.  Positive head thrust to right and negative to left.  Pt testing suggestive of right hypofunction.   Transfers Transfers: Sit to Stand;Stand to Sit Sit to Stand: From chair/3-in-1;6: Modified independent (Device/Increase time);With upper extremity assist;With armrests Stand to Sit: 6: Modified independent (Device/Increase time);To chair/3-in-1;Without upper extremity assist;With armrests Ambulation/Gait Ambulation/Gait Assistance: Not tested (comment) Stairs: No Wheelchair Mobility Wheelchair Mobility: No    Exercises Other Exercises Other Exercises: x1 exercises in sitting initiated with pt performing side to  side and up and down x3 reps with dizzy to 7/10.  Recovers to 3/10 between exercises.  Handouts given. Other Exercises: Instructed in progression to standing exercises but pt knows not to perform standing exercises without a family member guarding her.  Handouts given.     PT Goals Acute Rehab PT Goals Pt will Perform Home Exercise Program: with supervision, verbal cues required/provided PT Goal: Perform Home Exercise Program - Progress: Goal set today Additional Goals Additional Goal #1: Pt to report dizziness 3/10 with standing x 1 exercises.    PT Goal: Additional Goal #1 - Progress: Goal set today  Visit Information  Last PT Received On: 07/14/12 Assistance Needed: +1    Subjective Data  Subjective: "I thought I would live with this dizziness for the rest of my life."   Cognition  Cognition Overall Cognitive Status: Appears within functional limits for tasks assessed/performed Arousal/Alertness: Awake/alert Orientation Level: Appears intact for tasks assessed Behavior During Session: [facility] for tasks performed    Balance  Static Standing Balance Static Standing - Balance Support: No upper extremity supported Static Standing - Level of Assistance: 5: Stand by assistance Static Standing - Comment/# of Minutes: 3 minutes Dynamic Standing Balance Dynamic Standing - Balance Support: No upper extremity supported;During functional activity Dynamic Standing - Level of Assistance: 4: Min assist Dynamic Standing - Comments: needed steadying assist when performing head turns with x1 exercises in standing due to dizziness.    End of Session PT - End of Session Equipment Utilized During Treatment: Gait belt Activity Tolerance: Patient tolerated treatment well Patient left: in bed;with call bell/phone within reach Nurse Communication: Mobility status        INGOLD,Draylon Mercadel 07/14/2012, 3:37 PM Javoris Star Ingold,PT Acute Rehabilitation  [phone] (pager)

## 2012-07-14 NOTE — Discharge Summary (Signed)
Physician Discharge Summary  [name] [name] ZOX:[id number] DOB: [birth year] DOA: 4/77/2014  PCP: Judie Petit, MD  Admit date: 07/11/2012 Discharge date: 07/14/2012  Time spent: >35  minutes  Recommendations for Outpatient Follow-up:  1. Please be sure to follow up with your primary care physician and Cardiologist in 1-2 weeks or sooner should any new concerns arise. 2. Check Potassium levels on post hospital stay f/u  Discharge Diagnoses:  Principal Problem:   TIA (transient ischemic attack) Active Problems:   HYPERLIPIDEMIA   HYPERTENSION   MYOCARDIAL INFARCTION, HX OF   CORONARY ARTERY DISEASE   LBBB   Syncope   Discharge Condition: stable  Diet recommendation: Heart healthy  Filed Weights   07/11/12 1514 07/14/12 0405  Weight: 65.318 kg (144 lb) 63.504 kg (140 lb)    History of present illness:  79 y/o cad, LBBB, HTN, and HPL that presented to the ED after syncope.  Hospital Course:  1. Syncope - work up so far negative including orthostatic vitals ([place] they were obtained likely after patient had IVF rehydration).  - Agree that this is most likely postural and or 2ary to depleted intravascular volume  - Agree with holding metoprolol as per my discussion with cardiology and will plan on discharging off of this medication. - Cardiology took patient to cath lab for further evaluation given decreased EF, which was unremarkable.  Cardiology recommends continuing ACE Inhibitor. - no red flags reported on telemetry monitoring.  - EEG interpreted as no evidence of epileptic disorder.  - Physical therapy has evaluated and is recommending outpatient vestibular therapist at outpatient physical therapy center.  Will provide script so patient may follow up.  Most likely contributing to syncope as well given complaints of dizziness with certain head positions.  2. CAD  - h/o bypass surgery in [facility] with patent grafts by cardiac catheterization in 2010 with normal left  ventricular systolic function of 60% on recent echocardiogram was noticed to have a decrease in EF.. As such cardiology consulted and patient taken to cath for further evaluation. Cash showed no evidence of new ischemic disease.  - Will f/u with their recommendations.  - continue plavix and lipitor   3. HPL  - continue lipitor   4. HTN  - Will continue ACE I as outpatient.  5. Hypokalemia - Will replace orally  Procedures:  Cardiac cath by Dr. Charlton Haws on 07/13/12  Consultations:  Cardiology: Valera Castle MD  Discharge Exam: Filed Vitals:   07/13/12 1959 07/14/12 0000 07/14/12 0400 07/14/12 0405  BP: 147/70 149/83 121/63   Pulse: 89 71 90   Temp: 98.2 F (36.8 C) 98 F (36.7 C) 98 F (36.7 C)   TempSrc: Oral Oral Oral   Resp: 18 20 20    Height:    5\' 7"  (1.702 m)  Weight:    63.504 kg (140 lb)  SpO2: 98% 96% 96%     General: Pt in NAD, Alert and Awake Cardiovascular: RRR, No MRG Respiratory: CTA BL, no wheezes  Discharge Instructions  Discharge Orders   Future Appointments Provider Department Dept Phone   07/14/2012 10:00 AM [facility] Main Office [place]) [phone]   Future Orders Complete By Expires     Call MD for:  difficulty breathing, headache or visual disturbances  As directed     Call MD for:  persistant dizziness or light-headedness  As directed     Call MD for:  temperature >100.4  As directed     Diet - low  sodium heart healthy  As directed     Increase activity slowly  As directed         Medication List    STOP taking these medications       metoprolol 50 MG tablet  Commonly known as:  LOPRESSOR  diphenhydrAMINE 25 MG tablet  Commonly known as:  SOMINEX  Take 50 mg by mouth at bedtime.        TAKE these medications       amoxicillin 500 MG capsule  Commonly known as:  AMOXIL  One hour prior to dental appt     atorvastatin 20 MG tablet  Commonly known as:  LIPITOR  Take 20 mg by mouth daily.      clopidogrel 75 MG tablet  Commonly known as:  PLAVIX  Take 1 tablet (75 mg total) by mouth daily.              nitroGLYCERIN 0.4 MG/SPRAY spray  Commonly known as:  NITROLINGUAL  Place 1 spray under the tongue every 5 (five) minutes as needed.     ramipril 5 MG capsule  Commonly known as:  ALTACE  Take 1 capsule (5 mg total) by mouth daily.          The results of significant diagnostics from this hospitalization (including imaging, microbiology, ancillary and laboratory) are listed below for reference.    Significant Diagnostic Studies: Dg Chest 2 View  07/11/2012  *RADIOLOGY REPORT*  Clinical Data: Loss of consciousness  CHEST - 2 VIEW  Comparison: Aug 30, 2008.  Findings: Stable mild cardiomegaly.  Sternotomy wires are noted. Surgical clips are noted in left hilar region.  No acute pulmonary disease is noted.  No pneumothorax or pleural effusion is noted.  IMPRESSION: No acute cardiopulmonary abnormality seen.   Original Report Authenticated By: Lupita Raider.,  M.D.    Ct Head Without Contrast  07/11/2012  *RADIOLOGY REPORT*  Clinical Data: Loss of consciousness.  CT HEAD WITHOUT CONTRAST  Technique:  Contiguous axial images were obtained from the base of the skull through the vertex without contrast.  Comparison: CT 08/30/2008  Findings: Generalized atrophy, stable.  Chronic microvascular ischemic changes in the white matter, with progression.  Negative for acute infarct.  Negative for hemorrhage or mass lesion. Calvarium intact.  IMPRESSION: Atrophy and chronic microvascular ischemia.  No acute abnormality.   Original Report Authenticated By: Janeece Riggers, M.D.    Mri Brain Without Contrast  07/11/2012  *RADIOLOGY REPORT*  Clinical Data:  Several episodes of syncope over past few days. History of coronary artery disease and hypertension.  Dizziness.  MRI BRAIN WITHOUT CONTRAST MRA HEAD WITHOUT CONTRAST  Technique: Multiplanar, multiecho pulse sequences of the brain and surrounding  structures were obtained according to standard protocol without intravenous contrast.  Angiographic images of the head were obtained using MRA technique without contrast.  Comparison: Fall 05/01/2012 head CT.  10/11/2007 brain MR.  MRI HEAD  Findings:  No acute infarct.  Prominent small vessel disease type changes.  Global atrophy without hydrocephalus.  No intracranial hemorrhage.  Right frontal region sub centimeter calcified structure may represent a small meningioma and is without change.  No associate mass effect.  Cervical medullary junction, pituitary region, pineal region and orbital structures unremarkable.  Cervical spondylotic changes with spinal stenosis and cord flattening most prominent C4-5 level.  IMPRESSION: No acute infarct.  Prominent small vessel disease type changes.  Global atrophy without hydrocephalus.  Right frontal region sub centimeter calcified structure may represent  a small meningioma and is without change.  No associate mass effect.  Cervical spondylotic changes with spinal stenosis and cord flattening most prominent C4-5 level.  MRA HEAD  Findings: Anterior circulation without medium or large size vessel significant stenosis or occlusion.  Middle cerebral artery branch vessel irregularity and mild narrowing bilaterally.  Left vertebral artery is diminutive in size after the takeoff of the left PICA.  The right vertebral artery is dominant and ectatic.  Mild irregularity of the PICA bilaterally.  No significant stenosis of the basilar artery.  Nonvisualization AICAs.  Mild to moderate narrowing proximal left superior cerebellar artery.  No aneurysm or vascular malformation noted.  IMPRESSION: Left vertebral artery is diminutive in size after the takeoff of the left PICA.  The right vertebral artery is dominant and ectatic.  No significant stenosis of the basilar artery.  Anterior circulation without medium or large size vessel significant stenosis or occlusion.  Branch vessel  irregularity as noted above.   Original Report Authenticated By: Lacy Duverney, M.D.    Mr Mra Head/brain Wo Cm  07/11/2012  *RADIOLOGY REPORT*  Clinical Data:  Several episodes of syncope over past few days. History of coronary artery disease and hypertension.  Dizziness.  MRI BRAIN WITHOUT CONTRAST MRA HEAD WITHOUT CONTRAST  Technique: Multiplanar, multiecho pulse sequences of the brain and surrounding structures were obtained according to standard protocol without intravenous contrast.  Angiographic images of the head were obtained using MRA technique without contrast.  Comparison: Fall 05/01/2012 head CT.  10/11/2007 brain MR.  MRI HEAD  Findings:  No acute infarct.  Prominent small vessel disease type changes.  Global atrophy without hydrocephalus.  No intracranial hemorrhage.  Right frontal region sub centimeter calcified structure may represent a small meningioma and is without change.  No associate mass effect.  Cervical medullary junction, pituitary region, pineal region and orbital structures unremarkable.  Cervical spondylotic changes with spinal stenosis and cord flattening most prominent C4-5 level.  IMPRESSION: No acute infarct.  Prominent small vessel disease type changes.  Global atrophy without hydrocephalus.  Right frontal region sub centimeter calcified structure may represent a small meningioma and is without change.  No associate mass effect.  Cervical spondylotic changes with spinal stenosis and cord flattening most prominent C4-5 level.  MRA HEAD  Findings: Anterior circulation without medium or large size vessel significant stenosis or occlusion.  Middle cerebral artery branch vessel irregularity and mild narrowing bilaterally.  Left vertebral artery is diminutive in size after the takeoff of the left PICA.  The right vertebral artery is dominant and ectatic.  Mild irregularity of the PICA bilaterally.  No significant stenosis of the basilar artery.  Nonvisualization AICAs.  Mild to moderate  narrowing proximal left superior cerebellar artery.  No aneurysm or vascular malformation noted.  IMPRESSION: Left vertebral artery is diminutive in size after the takeoff of the left PICA.  The right vertebral artery is dominant and ectatic.  No significant stenosis of the basilar artery.  Anterior circulation without medium or large size vessel significant stenosis or occlusion.  Branch vessel irregularity as noted above.   Original Report Authenticated By: Lacy Duverney, M.D.     Microbiology: No results found for this or any previous visit (from the past 240 hour(s)).   Labs: Basic Metabolic Panel:  Recent Labs Lab 07/10/12 1451 07/11/12 1123 07/14/12 0513  NA 133* 135 135  K 3.8 4.0 3.1*  CL 97 98 100  CO2 28 26 25   GLUCOSE 107* 89 107*  BUN 11 12 8   CREATININE 0.8 0.79 0.72  CALCIUM 8.7 9.5 8.8   Liver Function Tests: No results found for this basename: AST, ALT, ALKPHOS, BILITOT, PROT, ALBUMIN,  in the last 168 hours No results found for this basename: LIPASE, AMYLASE,  in the last 168 hours No results found for this basename: AMMONIA,  in the last 168 hours CBC:  Recent Labs Lab 07/10/12 1451 07/11/12 1123  WBC 5.8 6.0  NEUTROABS 3.7  --   HGB 13.2 14.0  HCT 39.3 40.3  MCV 89.3 85.6  PLT 152.0 143*   Cardiac Enzymes: No results found for this basename: CKTOTAL, CKMB, CKMBINDEX, TROPONINI,  in the last 168 hours BNP: BNP (last 3 results) No results found for this basename: PROBNP,  in the last 8760 hours CBG:  Recent Labs Lab 07/13/12 0802 07/13/12 1254 07/13/12 1656 07/13/12 2132 07/14/12 0745  GLUCAP 99 85 131* 136* 94       Signed:  Gay Moncivais  [facility] 07/14/2012, 9:17 AM

## 2012-07-15 ENCOUNTER — Emergency Department (HOSPITAL_COMMUNITY): Payer: Medicare Other

## 2012-07-15 ENCOUNTER — Emergency Department (HOSPITAL_COMMUNITY)
Admission: EM | Admit: 2012-07-15 | Discharge: 2012-07-15 | Disposition: A | Discharge status: Home / Self Care | Payer: Medicare Other | Attending: Emergency Medicine | Admitting: Emergency Medicine

## 2012-07-15 ENCOUNTER — Encounter (HOSPITAL_COMMUNITY): Payer: Self-pay | Admitting: Emergency Medicine

## 2012-07-15 DIAGNOSIS — IMO0001 Reserved for inherently not codable concepts without codable children: Secondary | ICD-10-CM | POA: Insufficient documentation

## 2012-07-15 DIAGNOSIS — R05 Cough: Secondary | ICD-10-CM | POA: Insufficient documentation

## 2012-07-15 DIAGNOSIS — Z79899 Other long term (current) drug therapy: Secondary | ICD-10-CM | POA: Insufficient documentation

## 2012-07-15 DIAGNOSIS — I252 Old myocardial infarction: Secondary | ICD-10-CM | POA: Insufficient documentation

## 2012-07-15 DIAGNOSIS — E785 Hyperlipidemia, unspecified: Secondary | ICD-10-CM | POA: Insufficient documentation

## 2012-07-15 DIAGNOSIS — Z7902 Long term (current) use of antithrombotics/antiplatelets: Secondary | ICD-10-CM | POA: Insufficient documentation

## 2012-07-15 DIAGNOSIS — J3489 Other specified disorders of nose and nasal sinuses: Secondary | ICD-10-CM | POA: Insufficient documentation

## 2012-07-15 DIAGNOSIS — R52 Pain, unspecified: Secondary | ICD-10-CM | POA: Insufficient documentation

## 2012-07-15 DIAGNOSIS — Z860100 Personal history of colon polyps, unspecified: Secondary | ICD-10-CM | POA: Insufficient documentation

## 2012-07-15 DIAGNOSIS — Z951 Presence of aortocoronary bypass graft: Secondary | ICD-10-CM | POA: Insufficient documentation

## 2012-07-15 DIAGNOSIS — Z8542 Personal history of malignant neoplasm of other parts of uterus: Secondary | ICD-10-CM | POA: Insufficient documentation

## 2012-07-15 DIAGNOSIS — I10 Essential (primary) hypertension: Secondary | ICD-10-CM | POA: Insufficient documentation

## 2012-07-15 DIAGNOSIS — N390 Urinary tract infection, site not specified: Secondary | ICD-10-CM | POA: Insufficient documentation

## 2012-07-15 DIAGNOSIS — R059 Cough, unspecified: Secondary | ICD-10-CM | POA: Insufficient documentation

## 2012-07-15 DIAGNOSIS — I2510 Atherosclerotic heart disease of native coronary artery without angina pectoris: Secondary | ICD-10-CM | POA: Insufficient documentation

## 2012-07-15 DIAGNOSIS — Z85820 Personal history of malignant melanoma of skin: Secondary | ICD-10-CM | POA: Insufficient documentation

## 2012-07-15 LAB — CBC WITH DIFFERENTIAL/PLATELET
Hemoglobin: 13.8 g/dL (ref 12.0–15.0)
Lymphs Abs: 1.5 10*3/uL (ref 0.7–4.0)
MCH: 30.6 pg (ref 26.0–34.0)
Monocytes Relative: 13 % — ABNORMAL HIGH (ref 3–12)
Neutro Abs: 4.8 10*3/uL (ref 1.7–7.7)
Neutrophils Relative %: 66 % (ref 43–77)
RBC: 4.51 MIL/uL (ref 3.87–5.11)

## 2012-07-15 LAB — URINE MICROSCOPIC-ADD ON

## 2012-07-15 LAB — URINALYSIS, ROUTINE W REFLEX MICROSCOPIC
Glucose, UA: NEGATIVE mg/dL
Ketones, ur: NEGATIVE mg/dL
Specific Gravity, Urine: 1.022 (ref 1.005–1.030)
pH: 6.5 (ref 5.0–8.0)

## 2012-07-15 LAB — BASIC METABOLIC PANEL
BUN: 13 mg/dL (ref 6–23)
Chloride: 101 mEq/L (ref 96–112)
Glucose, Bld: 111 mg/dL — ABNORMAL HIGH (ref 70–99)
Potassium: 4.1 mEq/L (ref 3.5–5.1)

## 2012-07-15 MED ORDER — DEXTROSE 5 % IV SOLN
1.0000 g | Freq: Once | INTRAVENOUS | Status: AC
  Administered 2012-07-15: 1 g via INTRAVENOUS
  Filled 2012-07-15: qty 10

## 2012-07-15 MED ORDER — CEPHALEXIN 500 MG PO CAPS: 500.0000 mg | ORAL_CAPSULE | Freq: Four times a day (QID) | ORAL | Status: DC

## 2012-07-15 NOTE — ED Notes (Signed)
Patient transported to X-ray 

## 2012-07-15 NOTE — ED Notes (Signed)
Rx x 1.  Pt voiced understanding to f/u with PCP, finish abx rx, and return for worsening condition.

## 2012-07-15 NOTE — ED Provider Notes (Addendum)
History     CSN: [id number]  Arrival date & time 07/15/12  0152   First MD Initiated Contact with Patient 07/15/12 [phone]      Chief Complaint  Patient presents with  . Fever  . Generalized Body Aches    (Consider location/radiation/quality/duration/timing/severity/associated sxs/prior treatment) Patient is a 79 y.o. female presenting with fever. The history is provided by the patient and a relative.  Fever Max temp prior to arrival:  101.4 Temp source:  Oral Severity:  Moderate Onset quality:  Sudden Duration:  1 hour Timing:  Constant Progression:  Improving Chronicity:  New Relieved by:  Acetaminophen Worsened by:  Nothing tried Associated symptoms: chills, congestion, cough, myalgias and rhinorrhea   Associated symptoms: no chest pain, no confusion, no diarrhea, no dysuria, no headaches, no nausea, no rash, no sore throat and no vomiting   Risk factors comment:  Pt recently hospitalized here for the last 4 days due to syncopal episodes.  while here recieved a cardiac cath, MRI and echo without acute findings.  falls thought to be vestibular.   Past Medical History  Diagnosis Date  . MELANOMA 09/06/2008  . HYPERLIPIDEMIA 10/06/2007  . HYPERTENSION 10/06/2007  . MYOCARDIAL INFARCTION, HX OF 10/06/2007  . CORONARY ARTERY DISEASE 10/06/2007  . LBBB 09/06/2008  . Atrial fibrillation 10/06/2007  . VARICOSE VEIN 09/29/2009  . UTERINE CANCER, HX OF 09/06/2008  . PERSONAL HX COLONIC POLYPS 10/30/2009  . Shortness of breath     Past Surgical History  Procedure Laterality Date  . Oophorectomy    . Right distal pretibeal      melanoma  . Coronary angioplasty with stent placement  2009  . Coronary artery bypass graft  2004  . Abdominal hysterectomy  1988  . Cardiac catheterization  08/30/2008    Family History  Problem Relation Age of Onset  . Stroke Mother   . Heart attack Father   . Colon cancer Neg Hx     History  Substance Use Topics  . Smoking status: Never Smoker    . Smokeless tobacco: Never Used  . Alcohol Use: Yes     Comment: daily gin or wine    OB History   Grav Para Term Preterm Abortions TAB SAB Ect Mult Living                  Review of Systems  Constitutional: Positive for fever and chills.  HENT: Positive for congestion and rhinorrhea. Negative for sore throat.   Respiratory: Positive for cough.   Cardiovascular: Negative for chest pain.  Gastrointestinal: Negative for nausea, vomiting and diarrhea.  Genitourinary: Negative for dysuria, urgency, frequency and hematuria.  Musculoskeletal: Positive for myalgias.       States while in the hospital started to develop left sided hip pain  Skin: Negative for rash.  Neurological: Negative for headaches.  Psychiatric/Behavioral: Negative for confusion.  All other systems reviewed and are negative.    Allergies  Nitroglycerin  Home Medications   Current Outpatient Rx  Name  Route  Sig  Dispense  Refill  . atorvastatin (LIPITOR) 20 MG tablet   Oral   Take 20 mg by mouth daily.         . clopidogrel (PLAVIX) 75 MG tablet   Oral   Take 1 tablet (75 mg total) by mouth daily.   90 tablet   3   . docusate sodium (COLACE) 100 MG capsule   Oral   Take 100 mg by mouth 2 (two) times  daily.         . ramipril (ALTACE) 5 MG capsule   Oral   Take 1 capsule (5 mg total) by mouth daily.   90 capsule   3   . amoxicillin (AMOXIL) 500 MG capsule      One hour prior to dental appt   4 capsule   0   . nitroGLYCERIN (NITROLINGUAL) 0.4 MG/SPRAY spray   Sublingual   Place 1 spray under the tongue every 5 (five) minutes as needed.   12 g   8     BP 139/69  Pulse 87  Temp(Src) 99.1 F (37.3 C) (Oral)  Resp 16  SpO2 96%  Physical Exam  Nursing note and vitals reviewed. Constitutional: She is oriented to person, place, and time. She appears well-developed and well-nourished. No distress.  HENT:  Head: Normocephalic and atraumatic.  Mouth/Throat: Oropharynx is clear  and moist.  Eyes: Conjunctivae and EOM are normal. Pupils are equal, round, and reactive to light.  Neck: Normal range of motion. Neck supple.  Cardiovascular: Normal rate, regular rhythm and intact distal pulses.   No murmur heard. Pulmonary/Chest: Effort normal and breath sounds normal. No respiratory distress. She has no wheezes. She has no rales.  Abdominal: Soft. She exhibits no distension. There is no tenderness. There is no rebound and no guarding.  Musculoskeletal: Normal range of motion. She exhibits no edema and no tenderness.  Cath site in the right femoral well appearing and no bruits  Neurological: She is alert and oriented to person, place, and time.  Skin: Skin is warm and dry. No rash noted. No erythema.  Psychiatric: She has a normal mood and affect. Her behavior is normal.    ED Course  Procedures (including critical care time)  Labs Reviewed  URINALYSIS, ROUTINE W REFLEX MICROSCOPIC - Abnormal; Notable for the following:    APPearance CLOUDY (*)    Hgb urine dipstick SMALL (*)    Leukocytes, UA SMALL (*)    All other components within normal limits  CBC WITH DIFFERENTIAL - Abnormal; Notable for the following:    Platelets 117 (*)    Monocytes Relative 13 (*)    All other components within normal limits  BASIC METABOLIC PANEL - Abnormal; Notable for the following:    Glucose, Bld 111 (*)    GFR calc non Af Amer 80 (*)    All other components within normal limits  URINE CULTURE  CULTURE, BLOOD (ROUTINE X 2)  CULTURE, BLOOD (ROUTINE X 2)  URINE MICROSCOPIC-ADD ON   Dg Chest 2 View  07/15/2012  *RADIOLOGY REPORT*  Clinical Data: Fever, cough, weakness, shortness of breath.  CHEST - 2 VIEW  Comparison: 07/11/2012  Findings: Stable postoperative changes in the mediastinum.  Mild hyperinflation. The heart size and pulmonary vascularity are normal. The lungs appear clear and expanded without focal air space disease or consolidation. No blunting of the costophrenic  angles. Tortuous aorta.  No pneumothorax.  Mediastinal contours appear intact.  No significant change since previous study.  IMPRESSION: Hyperinflation.  No evidence of active pulmonary disease.   Original Report Authenticated By: Burman Nieves, M.D.      1. UTI (lower urinary tract infection)       MDM   Patient with a recent hospitalization due 2 syncopal events and was discharged earlier today. She had a complete workup including cardiac catheterization, echo and MRI without any cardiac findings other than decreased ejection fraction of 30%. Her hands were thought to be  caused by vestibular issues and she was scheduled to see [facility] neurology. However today after she got home she developed chills, myalgias and a fever of 101.4. She was given Tylenol prior to arrival the patient arrived here her temperature was normal but she was tachycardic which resolved with rest. She states since being hospitalized she's had a mild cough and diarrhea but denies any shortness of breath.  She is also complaining of some mild left hip pain but denies frequency, urgency or dysuria. The patient is otherwise stable and well appearing.  Cath site without any signs of infection.  Chest x-ray, CBC, BMP, UA  5:02 AM Chest x-ray within normal limits. CBC and BMP stapler. UA consistent with a new urinary tract infection which is most likely the cause of the patient's fever and chills. She was given a dose of IV Rocephin and sent home with Keflex. Also blood cultures were done to ensure no sign of bacteremia. However patient is well-appearing a reliable patient and now on antibiotics and will be discharged home with her daughter to return for worsening symptoms    Gwyneth Sprout, MD 07/15/12 2130  Gwyneth Sprout, MD 07/15/12 [phone]

## 2012-07-15 NOTE — ED Notes (Addendum)
Pt was discharged from [facility] yesterday post cardiac cath.  Reports fever 101.4 and generalized body aches (worse to bilateral legs and lower back) since 10pm.  Took Tylenol at home.

## 2012-07-15 NOTE — ED Notes (Signed)
Pt returned from xray

## 2012-07-16 LAB — GLUCOSE, CAPILLARY: Glucose-Capillary: 114 mg/dL — ABNORMAL HIGH (ref 70–99)

## 2012-07-17 ENCOUNTER — Ambulatory Visit (HOSPITAL_COMMUNITY)
Admission: RE | Admit: 2012-07-17 | Discharge: 2012-07-17 | Disposition: A | Discharge status: Home / Self Care | Payer: Medicare Other | Source: Ambulatory Visit | Attending: Family Medicine | Admitting: Family Medicine

## 2012-07-17 ENCOUNTER — Inpatient Hospital Stay (HOSPITAL_COMMUNITY): Admission: RE | Admit: 2012-07-17 | Payer: Medicare Other | Source: Ambulatory Visit

## 2012-07-17 ENCOUNTER — Telehealth: Payer: Self-pay | Admitting: Pharmacist

## 2012-07-17 ENCOUNTER — Telehealth: Payer: Self-pay | Admitting: *Deleted

## 2012-07-17 ENCOUNTER — Telehealth: Payer: Self-pay | Admitting: Internal Medicine

## 2012-07-17 ENCOUNTER — Encounter (HOSPITAL_COMMUNITY): Payer: Medicare Other

## 2012-07-17 DIAGNOSIS — M79609 Pain in unspecified limb: Secondary | ICD-10-CM | POA: Insufficient documentation

## 2012-07-17 DIAGNOSIS — M25561 Pain in right knee: Secondary | ICD-10-CM

## 2012-07-17 DIAGNOSIS — Z8542 Personal history of malignant neoplasm of other parts of uterus: Secondary | ICD-10-CM | POA: Insufficient documentation

## 2012-07-17 DIAGNOSIS — M79604 Pain in right leg: Secondary | ICD-10-CM

## 2012-07-17 DIAGNOSIS — M25469 Effusion, unspecified knee: Secondary | ICD-10-CM | POA: Insufficient documentation

## 2012-07-17 DIAGNOSIS — I868 Varicose veins of other specified sites: Secondary | ICD-10-CM | POA: Insufficient documentation

## 2012-07-17 DIAGNOSIS — M25569 Pain in unspecified knee: Secondary | ICD-10-CM | POA: Insufficient documentation

## 2012-07-17 DIAGNOSIS — M1120 Other chondrocalcinosis, unspecified site: Secondary | ICD-10-CM | POA: Insufficient documentation

## 2012-07-17 DIAGNOSIS — I4891 Unspecified atrial fibrillation: Secondary | ICD-10-CM | POA: Insufficient documentation

## 2012-07-17 DIAGNOSIS — C439 Malignant melanoma of skin, unspecified: Secondary | ICD-10-CM | POA: Insufficient documentation

## 2012-07-17 LAB — URINE CULTURE

## 2012-07-17 NOTE — Addendum Note (Signed)
Addended by: Kristian Covey on: 07/17/2012 10:16 AM   Modules accepted: Orders

## 2012-07-17 NOTE — Telephone Encounter (Signed)
I spoke with son in law Vonna Kotyk) and based on information given I do think D-dimer, venous doppler, and right knee x-ray are very reasonable and we ordered these studies.

## 2012-07-17 NOTE — Telephone Encounter (Signed)
Gardiner Ramus, our referral specialist at [facility] took care of scheduling and informed pt, daughter and son-in-law at [facility].

## 2012-07-17 NOTE — Telephone Encounter (Signed)
Dr. Caryl Never:  As we just discussed---the patient was seen in your office last Monday--and then admitted to [facility] after experiencing similar symptoms for which she saw you on Monday of last week. She was admitted from Tuesday last week to end of week--Friday last week. She had a very exhaustive workup for CVA/TIA while on [facility]--including MRI/CT of head; carotid dopplers (all negative). She underwent a 2D echo revealing 30% EF down from previously documented 60%. This was concerning enough to Dr. Daleen Squibb that he had her cathed on Thursday last week revealing all grafts and stents as being patent and revealing an EF of 35-40% by CATH.  She was diagnosed by end of Friday last week by PT/OT as having a vestibular component to her dizziness (nystagmus) but still denying any vertiginous component to this description. While home she has had one episode of nausea but we are uncertain if she had this after dosing her antibiotic without food.   She was discharged this past Friday at 5:00PM. By 10:00PM that night she endorsed fever to 101.4 degrees. Was advised to take patient back to the ED which was done. Stayed in ED from 1AM to 6:30AM diagnosed with UTI placed on cephalexin 500mg  QID. On Saturday/Sunday she began endorsing RLE pain, tenderness and swelling. This includes right anteriolateral aspect of knee but calf is swollen, tender and she endorses pain in the popliteal space to me. Leg is "hot" but not erythematous. She has had an acute flair of gout in past.   We are concerned she may have DVT. Her Well's pre-test probability testing value is 3 of 9 and my MedCalc this reveals a 53% percent liklihood of DVT. Recall she did fall prior to coming to your office last Monday--but to everyone she has denied any pain or tenderness at that time and made no mention of this while in hospital suggesting a new finding. She has had of course decreased ambulation as part of her hospital stay as well as she is worried about  high fall risk.  She currently has to be double assisted (each side) and will only walk assisted with 2 people/walker. She was to be seen by OP PT for addressing exercises specific to her dizzy spells/vestibular diagnosis.   Would a d-dimer, venous duplex U/S,  Knee xray/?MRI, uric acid level be warranted at this time?  Thanks, Dr. Hulen Luster

## 2012-07-17 NOTE — Telephone Encounter (Signed)
[facility] Triage Call Report Triage Record Num: [id number] Operator: Kelle Darting Patient Name: [name] [name] Call Date & Time: 07/15/2012 1:03:29AM Patient Phone: [phone] PCP: [name]. Swords Patient Gender: Female PCP Fax : [phone] Patient DOB: 1932-03-09 Practice Name: [facility] Reason for Call: Caller: [name]; PCP: Birdie Sons; CB#: [phone]; Call regarding Fever; Temp. 101.4 (orally); Onset: 07/14/12; Sx notes: Was admitted with syncope, was discharged from hospital 07/14/12, lots of tests, EF has dropped, cardiac cath. was done 07/13/12; has a lot of aches and pains, has lower back pain, has had a headache as well; Tylenol 500mg  given 2200, temp. has come down to 101 (orally), headache is better; at this time patient is resting in bed; denies any breathing problems or chest pain; patient had some calf pain earlier in the day; has had a cough/sinus problems during hospital stay; blood pressure at 142/73, heart rate at 106; Guideline used: Fever; Disposition: Call provider Immediately due to frail elderly, immunocompromised with temp. of 100.5 or higher; Spoke with Dr. Abner Greenspan, informed of same, states that the only way to know for sure is to have her evaluated; Daughter informed of same, states she is going to take her to be seen at [facility] ED; [facility] ED triage RN, Clydie Braun was made aware of patient. Protocol(s) Used: Fever - Adult Recommended Outcome per Protocol: Call Provider Immediately Reason for Outcome: Frail elderly, immunocompromised or pregnant person with temperature of 100.5 F (38.0 C) or higher Care Advice: ~ 04/05/

## 2012-07-17 NOTE — Progress Notes (Signed)
Right lower extremity venous duplex completed.  Right:  No evidence of DVT, superficial thrombosis, or Baker's cyst.  Left:  Negative for DVT in the common femoral vein.  

## 2012-07-17 NOTE — Telephone Encounter (Signed)
Notes reviewed. Go ahead and order d-dimer stat, venous Dopplers right lower extremity, and plain x-rays right knee. Would not do a uric acid of this point as this would not change diagnostics and frequently with acute gout flare uric acid levels will be lower

## 2012-07-17 NOTE — Telephone Encounter (Signed)
Dr. Vevelyn Francois:  This email is regarding the patient I emailed you guys about LAST MONDAY. Patient date of birth [birth year]. If you page me I can give the patient ID or tell you their name via phone but I believe/hope you will know to whom I refer.  After discharge Friday at 5:30PM having been admitted on Tuesday with a very thorough exhaustive workup that included a rule out of stroke/TIA by head CT/MRI and carotid dopplers--the 2D echo revealed a decreased EF from previous documented 60% to about 30%. This was troubling quantum sufficient to Dr. Daleen Squibb that he had her cathed on Thursday of last week revealing patent grafts and stents but with an EF of 35-40% BY CATH.   She was discharged FRIDAY at 5:00PM with a diagnosis of vestibular dysfunction per physcial therapy and was given an outpatient follow up with a physical therapist. She subsequently continues to have these dizzy spells that she indicates are NOT VERTIGINOUS.  At 10:00PM Friday night she called her daugher [name], RN) indicating she had fever which [name] confirmed to be 101.4.  [name] called the service and heard back from them at 1:30AM and the answering physician indicated for the patient to be taken to the ED. [name] took her to the ED where a UTI was diagnosed and PNA ruled out by CXR. She has been started on cephalexin 500mg  QID.  Yesterday (Saturday late in evening) she began endorsing RIGHT FOOT/RIGHT CALF pain/tenderness. When we examine the RLE it is edematous and HOT to touch. For ME (JBG)--independent of Sarah---she cites calf pain tenderness and popliteal space pain/tenderness. (For [name] she denied this).   We are concerned she may have a DVT. Her Well's criteria pre-test probability is 3/9 DVT likely 53%. We are wondering if you guys can order in EPIC a d-dimer and a venous duplex ultrasonography as an OP to be performed at [facility] on [address] 7-APR-14.  She has had as part of her PMH an acute flair of gout. Additionally,  recall Dr. Caryl Never that she did experience a week ago (day before your office visit) a fall---but to EVERYONE she denied any pain, swelling or discomfort of this RIGHT knee last Monday. Again--not sure if this is related to her fall OR to having been in hospital with decreased ambulation from Tuesday to Friday of last week while hospitalized.  Would it be possible to get: 1. d-dimer 2. venous duplex U/S 3. knee xray 4. Uric acid level  My TEMPORARY pager is [phone]. [name] is WORKING TODAY on MICU/2100 at [phone] if you want to call her to discuss as well.   THANKS! Vonna Kotyk

## 2012-07-18 ENCOUNTER — Telehealth: Payer: Self-pay | Admitting: *Deleted

## 2012-07-18 ENCOUNTER — Telehealth (HOSPITAL_COMMUNITY): Payer: Self-pay | Admitting: Emergency Medicine

## 2012-07-18 NOTE — Telephone Encounter (Signed)
done

## 2012-07-18 NOTE — Telephone Encounter (Signed)
Transitional care management:  Admit date:07/11/2012 Discharge date: 07/14/2012  Discharge diagnosis: Principal problem:  TIA Active problems:  hyperlipidemia  HTN  myocardial infarction  coronary artery disease  LBBB  Syncope  Discharge condition : stable  Talked with daughter. States mom continues to have syncopal episodes almost every day.  They seem to think it is a vestibular problem and will be going to vestibular rehabilitation.  She also had UTI while in hospital and was give rocephin and keflex po on discharge.  Pt is compliant with medication.    Saw dr Caryl Never on 4-7-for knee pain.  Was sent for knee xray and will have hospital f/u and knee follow up on 07-19-12.

## 2012-07-19 ENCOUNTER — Ambulatory Visit (INDEPENDENT_AMBULATORY_CARE_PROVIDER_SITE_OTHER): Payer: Medicare Other | Admitting: Family Medicine

## 2012-07-19 ENCOUNTER — Encounter: Payer: Self-pay | Admitting: Family Medicine

## 2012-07-19 ENCOUNTER — Encounter (HOSPITAL_COMMUNITY): Payer: Medicare Other

## 2012-07-19 VITALS — BP 108/60 | HR 60 | Temp 97.6°F | Wt 138.0 lb

## 2012-07-19 DIAGNOSIS — M25561 Pain in right knee: Secondary | ICD-10-CM

## 2012-07-19 DIAGNOSIS — M1120 Other chondrocalcinosis, unspecified site: Secondary | ICD-10-CM

## 2012-07-19 DIAGNOSIS — R55 Syncope and collapse: Secondary | ICD-10-CM

## 2012-07-19 DIAGNOSIS — I10 Essential (primary) hypertension: Secondary | ICD-10-CM

## 2012-07-19 NOTE — Patient Instructions (Addendum)
Touch base in 2 weeks if knee pain and dizziness not improving. Go ahead with vestibular rehab, as scheduled. Follow up promptly for any new symptoms.

## 2012-07-19 NOTE — Progress Notes (Signed)
Subjective:    Patient ID: [name] [name], female    DOB: Feb 27, 1932, 79 y.o.   MRN: [id number]  HPI Patient seen here recently for question of syncopal episode. We were in process of working up the patient who was admitted on 4-1 thru 07/14/2012 following repeated episode of severe dizziness and possible syncope. She has known history of cardiac disease with bypass 2004. She denied any recent chest pains. Her workup included repeat cardiac catheterization which showed no significant coronary artery issues. Telemetry monitoring revealed no significant arrhythmias. EEG revealed no evidence for epilepsy. There was question of postural hypotension though no significant orthostatic changes though patient was on IV fluids in hospital. She had MRA and MRI which did not show any acute abnormalities. No major carotid artery obstructions. Patient's metoprolol was discontinued. She had been taking Benadryl for sleep-this was discontinued as well.  Felt patient's symptoms may have had vestibular component. Physical therapy evaluated and recommended outpatient vestibular therapist which has been scheduled. She continues to have some dizziness which seems to be triggered by head movement. She describes as "lightheadedness" but no recurrent syncope.  Patient had inability to bear weight right lower extremity couple days ago. Pain is poorly localized. She was having some right lower extremity edema and concern for possible acute DVT. Venous Dopplers revealed no evidence for DVT. However pain seemed to be centered around the knee. Knee x-ray showed some degenerative changes and chondrocalcinosis. She has prior history of questionable gout involving the foot. No known history of pseudogout. Knee pain is gradually improving. Family relates knee effusion has improved I past couple of days. No erythema. No ecchymosis. No specific knee injury recalled-though she might have twisted this with near syncopal episode.  Past Medical  History  Diagnosis Date  . MELANOMA 09/06/2008  . HYPERLIPIDEMIA 10/06/2007  . HYPERTENSION 10/06/2007  . MYOCARDIAL INFARCTION, HX OF 10/06/2007  . CORONARY ARTERY DISEASE 10/06/2007  . LBBB 09/06/2008  . Atrial fibrillation 10/06/2007  . VARICOSE VEIN 09/29/2009  . UTERINE CANCER, HX OF 09/06/2008  . PERSONAL HX COLONIC POLYPS 10/30/2009  . Shortness of breath    Past Surgical History  Procedure Laterality Date  . Oophorectomy    . Right distal pretibeal      melanoma  . Coronary angioplasty with stent placement  2009  . Coronary artery bypass graft  2004  . Abdominal hysterectomy  1988  . Cardiac catheterization  08/30/2008    reports that she has never smoked. She has never used smokeless tobacco. She reports that  drinks alcohol. She reports that she does not use illicit drugs. family history includes Heart attack in her father and Stroke in her mother.  There is no history of Colon cancer. Allergies  Allergen Reactions  . Nitroglycerin Other (See Comments)    sensitive to tabs which causes rapid drop in blood pressure. Is ok to use oral spray form       Review of Systems  Constitutional: Positive for fatigue.  HENT: Negative for hearing loss.   Eyes: Negative for visual disturbance.  Respiratory: Negative for cough and shortness of breath.   Cardiovascular: Negative for chest pain.  Gastrointestinal: Negative for abdominal pain.  Genitourinary: Negative for dysuria.  Neurological: Positive for dizziness. Negative for seizures.       Objective:   Physical Exam  Constitutional: She appears well-developed and well-nourished.  Neck: Neck supple. No thyromegaly present.  Cardiovascular: Normal rate and regular rhythm.   Pulmonary/Chest: Effort normal and breath sounds normal.  No respiratory distress. She has no wheezes. She has no rales.  Musculoskeletal:  Right knee reveals minimal warmth compared to the left. Very small effusion. Mild medial joint line tenderness. No  erythema. Full range of motion. No right calf tenderness          Assessment & Plan:  #1 recent recurrent dizziness with question of recurrent syncope. She's had fairly extensive workup as above (cardiac cath, MRI, MRA, cardiac monitoring, EEG) negative. Question vestibular component. Check orthostatic blood pressures. Continue to hold the metoprolol. They have appt to see vestibular rehab specialist next week. #2 right knee pain. Suspect probable pseudogout flare. Patient reluctant to consider corticosteroid injection and avoid nonsteroidals given her age and medical problems/etc.  this appears to be improving And pt prefers to observe at this time.

## 2012-07-20 ENCOUNTER — Other Ambulatory Visit: Payer: Self-pay | Admitting: Internal Medicine

## 2012-07-20 ENCOUNTER — Telehealth: Payer: Self-pay | Admitting: *Deleted

## 2012-07-20 DIAGNOSIS — R42 Dizziness and giddiness: Secondary | ICD-10-CM

## 2012-07-20 NOTE — Telephone Encounter (Signed)
Pt has referral to vestibular rehab by Dr Dorene Sorrow at [facility] for vertigo.  They need an order put in EPIC by 1 pm today cause appt is tomorrow.  Pt seen Dr Caryl Never yesterday.  Please let me know if this is ok.

## 2012-07-20 NOTE — Telephone Encounter (Signed)
Ok per Dr Caryl Never, referral order placed

## 2012-07-21 ENCOUNTER — Ambulatory Visit: Payer: Medicare Other | Attending: Internal Medicine | Admitting: Physical Therapy

## 2012-07-21 ENCOUNTER — Encounter (HOSPITAL_COMMUNITY): Payer: Medicare Other

## 2012-07-21 DIAGNOSIS — R42 Dizziness and giddiness: Secondary | ICD-10-CM | POA: Insufficient documentation

## 2012-07-21 DIAGNOSIS — IMO0001 Reserved for inherently not codable concepts without codable children: Secondary | ICD-10-CM | POA: Insufficient documentation

## 2012-07-21 LAB — CULTURE, BLOOD (ROUTINE X 2): Culture: NO GROWTH

## 2012-07-24 ENCOUNTER — Encounter (HOSPITAL_COMMUNITY): Payer: Medicare Other

## 2012-07-25 ENCOUNTER — Ambulatory Visit: Payer: Medicare Other | Admitting: Physical Therapy

## 2012-07-26 ENCOUNTER — Encounter (HOSPITAL_COMMUNITY): Payer: Medicare Other

## 2012-07-27 ENCOUNTER — Ambulatory Visit: Payer: Medicare Other | Admitting: Physical Therapy

## 2012-07-28 ENCOUNTER — Encounter (HOSPITAL_COMMUNITY): Payer: Medicare Other

## 2012-07-31 ENCOUNTER — Ambulatory Visit: Payer: Medicare Other | Admitting: Physical Therapy

## 2012-07-31 ENCOUNTER — Encounter (HOSPITAL_COMMUNITY): Payer: Medicare Other

## 2012-08-02 ENCOUNTER — Encounter (HOSPITAL_COMMUNITY): Payer: Medicare Other

## 2012-08-03 ENCOUNTER — Ambulatory Visit: Payer: Medicare Other | Admitting: Physical Therapy

## 2012-08-04 ENCOUNTER — Encounter (HOSPITAL_COMMUNITY): Payer: Medicare Other

## 2012-08-07 ENCOUNTER — Encounter (HOSPITAL_COMMUNITY): Payer: Medicare Other

## 2012-08-07 ENCOUNTER — Ambulatory Visit: Payer: Medicare Other | Admitting: Physical Therapy

## 2012-08-09 ENCOUNTER — Encounter (HOSPITAL_COMMUNITY): Payer: Medicare Other

## 2012-08-09 ENCOUNTER — Telehealth: Payer: Self-pay | Admitting: Internal Medicine

## 2012-08-09 DIAGNOSIS — R55 Syncope and collapse: Secondary | ICD-10-CM

## 2012-08-09 NOTE — Telephone Encounter (Signed)
Pt seen Dr Caryl Never on 07/19/12.  Can you approve this since Dr Cato Mulligan is out of the office?

## 2012-08-09 NOTE — Telephone Encounter (Signed)
Yes.  I would go ahead and set up neurology referral.

## 2012-08-09 NOTE — Telephone Encounter (Signed)
Patient's daughter called stating that she would like to be referred to a neurologist post fall as she is still experiencing balance and other issues. Please assist.

## 2012-08-10 ENCOUNTER — Ambulatory Visit: Payer: Medicare Other | Attending: Internal Medicine | Admitting: Physical Therapy

## 2012-08-10 DIAGNOSIS — R42 Dizziness and giddiness: Secondary | ICD-10-CM | POA: Insufficient documentation

## 2012-08-10 DIAGNOSIS — IMO0001 Reserved for inherently not codable concepts without codable children: Secondary | ICD-10-CM | POA: Insufficient documentation

## 2012-08-10 NOTE — Telephone Encounter (Signed)
Referral order placed.

## 2012-08-11 ENCOUNTER — Encounter (HOSPITAL_COMMUNITY): Payer: Medicare Other

## 2012-08-12 DIAGNOSIS — M25569 Pain in unspecified knee: Secondary | ICD-10-CM

## 2012-08-12 DIAGNOSIS — I10 Essential (primary) hypertension: Secondary | ICD-10-CM

## 2012-08-12 DIAGNOSIS — R55 Syncope and collapse: Secondary | ICD-10-CM

## 2012-08-12 DIAGNOSIS — M1120 Other chondrocalcinosis, unspecified site: Secondary | ICD-10-CM

## 2012-08-14 ENCOUNTER — Ambulatory Visit: Payer: Medicare Other | Admitting: Physical Therapy

## 2012-08-14 ENCOUNTER — Encounter (HOSPITAL_COMMUNITY): Payer: Medicare Other

## 2012-08-16 ENCOUNTER — Encounter (HOSPITAL_COMMUNITY): Payer: Medicare Other

## 2012-08-16 ENCOUNTER — Ambulatory Visit: Payer: Medicare Other | Admitting: Physical Therapy

## 2012-08-17 ENCOUNTER — Encounter (INDEPENDENT_AMBULATORY_CARE_PROVIDER_SITE_OTHER): Payer: Medicare Other

## 2012-08-17 DIAGNOSIS — R55 Syncope and collapse: Secondary | ICD-10-CM

## 2012-08-18 ENCOUNTER — Encounter (HOSPITAL_COMMUNITY): Payer: Medicare Other

## 2012-08-18 ENCOUNTER — Telehealth: Payer: Self-pay | Admitting: Cardiology

## 2012-08-18 NOTE — Telephone Encounter (Signed)
I spoke with pt this morning about her concern over the Lopressor. She was seen by Dr. Daleen Squibb in the hospital last month & has followed up with her pcp. She is aware & reassured that she does not need to restart the  Lopressor as per her pcp ov & discharge instructions.  She is wearing an event monitor regarding the lightheaded "spell" she has when bending over.  Reviewed importance of maintaining good hydration & orthostatic precautions.  Reassurance given. Mylo Red RN

## 2012-08-18 NOTE — Telephone Encounter (Signed)
New problem   Pt has question about starting back her Lopressor that she has been off of for 1 month. Please call pt

## 2012-08-21 ENCOUNTER — Ambulatory Visit: Payer: Medicare Other | Admitting: Physical Therapy

## 2012-08-21 ENCOUNTER — Encounter (HOSPITAL_COMMUNITY): Payer: Medicare Other

## 2012-08-23 ENCOUNTER — Ambulatory Visit: Payer: Medicare Other | Admitting: Rehabilitative and Restorative Service Providers"

## 2012-08-23 ENCOUNTER — Telehealth: Payer: Self-pay | Admitting: Cardiology

## 2012-08-23 ENCOUNTER — Encounter (HOSPITAL_COMMUNITY): Payer: Medicare Other

## 2012-08-23 ENCOUNTER — Ambulatory Visit (INDEPENDENT_AMBULATORY_CARE_PROVIDER_SITE_OTHER): Payer: Medicare Other | Admitting: Family

## 2012-08-23 ENCOUNTER — Encounter: Payer: Self-pay | Admitting: Family

## 2012-08-23 ENCOUNTER — Telehealth: Payer: Self-pay | Admitting: Internal Medicine

## 2012-08-23 VITALS — BP 110/64 | HR 89 | Wt 146.0 lb

## 2012-08-23 DIAGNOSIS — R413 Other amnesia: Secondary | ICD-10-CM

## 2012-08-23 DIAGNOSIS — R5383 Other fatigue: Secondary | ICD-10-CM

## 2012-08-23 DIAGNOSIS — F329 Major depressive disorder, single episode, unspecified: Secondary | ICD-10-CM

## 2012-08-23 DIAGNOSIS — R5381 Other malaise: Secondary | ICD-10-CM

## 2012-08-23 MED ORDER — ESCITALOPRAM OXALATE 10 MG PO TABS: 10.0000 mg | ORAL_TABLET | Freq: Every day | ORAL | Status: DC

## 2012-08-23 NOTE — Telephone Encounter (Signed)
New Prob      Daughter would like to speak to nurse regarding appointment in June. Please call.

## 2012-08-23 NOTE — Patient Instructions (Addendum)

## 2012-08-23 NOTE — Telephone Encounter (Signed)
Patient Information:  Caller Name: Anel  Phone: [phone]  Patient: [name] [name], [name] [name]  Gender: Female  DOB: 07-22-1931  Age: 79 Years  PCP: Birdie Sons (Adults only)  Office Follow Up:  Does the office need to follow up with this patient?: No  Instructions For The Office: N/A   Symptoms  Reason For Call & Symptoms: Depression. Daughter, who is a Engineer, civil (consulting), thinks she is depressed.  Does not want to go out much, does not feel comfortable with a lot of people.  Performing ADL's. Was hospitalized in early April 2014 for fall and completed rehab for dizziness. Currently wearing a cardiac Holter moniter.  Reviewed Health History In EMR: Yes  Reviewed Medications In EMR: Yes  Reviewed Allergies In EMR: Yes  Reviewed Surgeries / Procedures: Yes  Date of Onset of Symptoms: 08/16/2012  Guideline(s) Used:  Depression  Disposition Per Guideline:   See Within 3 Days in Office  Reason For Disposition Reached:   Patient wants to be seen  Advice Given:  Reassurance:   People with depression do get through this -- even people who feel as badly as you feel now. You can be helped.  Encourage the caller to talk about his/her problems and feelings.  Call Back If:  Sadness or depression symptoms persist more than 2 weeks  You want to talk with a counselor  You feel like harming yourself  You become worse.  Patient Will Follow Care Advice:  YES  Appointment Scheduled:  08/23/2012 15:45:00 Appointment Scheduled Provider:  Adline Mango [facility])

## 2012-08-23 NOTE — Telephone Encounter (Signed)
Called patient's daughter back. She was concerned that her Mom's EF had decreased shown on echo from 4/2 (done when she was in the hospital). States that she had a cardiac cath that showed no blockages. States that no one addressed this issue when she was in the hospital. Currently wearing an event monitor. Checked strips submitted currently and there was no evidence of any rhythm problems causing any pre syncope. Advised her to keep appointment in June with the PA to review monitor and echo results done at the hospital. She will call us back if there is any change in the patient's condition.

## 2012-08-24 ENCOUNTER — Encounter: Payer: Self-pay | Admitting: Family

## 2012-08-24 ENCOUNTER — Other Ambulatory Visit: Payer: Self-pay | Admitting: Family

## 2012-08-24 DIAGNOSIS — Z780 Asymptomatic menopausal state: Secondary | ICD-10-CM

## 2012-08-24 LAB — BASIC METABOLIC PANEL
CO2: 30 mEq/L (ref 19–32)
GFR: 80.5 mL/min (ref >60.00)
Glucose, Bld: 93 mg/dL (ref 70–99)
Potassium: 4.5 mEq/L (ref 3.5–5.1)
Sodium: 133 mEq/L — ABNORMAL LOW (ref 135–145)

## 2012-08-24 LAB — FOLATE: Folate: 12.1 ng/mL (ref >5.9)

## 2012-08-24 NOTE — Progress Notes (Signed)
Subjective:    Patient ID: [name] [name], female    DOB: 1931-09-15, 79 y.o.   MRN: [id number]  HPI 79 year old white female, nonsmoker, patient of Dr. Cato Mulligan is in today with complaints of depression. She is accompanied by her daughter today who has concerns that she is isolating herself socially. Patient does admit that she is socially isolated related to in general not feeling well and having decreased energy. Her husband died approximately 3 years ago. Her dog died recently. She's been having a difficult time coping. She recently found out that her ejection fraction decreased from 55-60% to 25%. She's been wearing a Holter monitor for the last couple months and is very frustrated with it. In April, she was admitted for syncopal episode and hospitalized initially started a downward spiral to depression. She has feelings of helplessness, but denies any hopelessness or thoughts of death or dying.   Review of Systems  Constitutional: Negative.   HENT: Negative.   Respiratory: Negative.   Cardiovascular: Negative.   Musculoskeletal: Negative.   Neurological: Negative.   Hematological: Negative.   Psychiatric/Behavioral: Positive for sleep disturbance. Negative for suicidal ideas. The patient is nervous/anxious.    Past Medical History  Diagnosis Date  . MELANOMA 09/06/2008  . HYPERLIPIDEMIA 10/06/2007  . HYPERTENSION 10/06/2007  . MYOCARDIAL INFARCTION, HX OF 10/06/2007  . CORONARY ARTERY DISEASE 10/06/2007  . LBBB 09/06/2008  . Atrial fibrillation 10/06/2007  . VARICOSE VEIN 09/29/2009  . UTERINE CANCER, HX OF 09/06/2008  . PERSONAL HX COLONIC POLYPS 10/30/2009  . Shortness of breath     History   Social History  . Marital Status: Widowed    Spouse Name: N/A    Number of Children: N/A  . Years of Education: N/A   Occupational History  . Not on file.   Social History Main Topics  . Smoking status: Never Smoker   . Smokeless tobacco: Never Used  . Alcohol Use: Yes     Comment:  daily gin or wine  . Drug Use: No  . Sexually Active: Not on file   Other Topics Concern  . Not on file   Social History Narrative  . No narrative on file    Past Surgical History  Procedure Laterality Date  . Oophorectomy    . Right distal pretibeal      melanoma  . Coronary angioplasty with stent placement  2009  . Coronary artery bypass graft  2004  . Abdominal hysterectomy  1988  . Cardiac catheterization  08/30/2008    Family History  Problem Relation Age of Onset  . Stroke Mother   . Heart attack Father   . Colon cancer Neg Hx     Allergies  Allergen Reactions  . Nitroglycerin Other (See Comments)    sensitive to tabs which causes rapid drop in blood pressure. Is ok to use oral spray form    Current Outpatient Prescriptions on File Prior to Visit  Medication Sig Dispense Refill  . atorvastatin (LIPITOR) 20 MG tablet Take 20 mg by mouth daily.      . clopidogrel (PLAVIX) 75 MG tablet Take 1 tablet (75 mg total) by mouth daily.  90 tablet  3  . docusate sodium (COLACE) 100 MG capsule Take 100 mg by mouth 2 (two) times daily.      . nitroGLYCERIN (NITROLINGUAL) 0.4 MG/SPRAY spray Place 1 spray under the tongue every 5 (five) minutes as needed.  12 g  8  . ramipril (ALTACE) 5 MG capsule Take 1  capsule (5 mg total) by mouth daily.  90 capsule  3  . amoxicillin (AMOXIL) 500 MG capsule One hour prior to dental appt  4 capsule  0   No current facility-administered medications on file prior to visit.    BP 110/64  Pulse 89  Wt 146 lb (66.225 kg)  BMI 22.86 kg/m2  SpO2 97%chart    Objective:   Physical Exam  Constitutional: She is oriented to person, place, and time. She appears well-developed and well-nourished.  Neck: Normal range of motion. Neck supple. No thyromegaly present.  Cardiovascular: Normal rate, regular rhythm and normal heart sounds.   Pulmonary/Chest: Effort normal and breath sounds normal.  Neurological: She is alert and oriented to person,  place, and time.  Skin: Skin is warm and dry.  Psychiatric: She has a normal mood and affect.          Assessment & Plan:  Assessment:  1. Depression 2. Congestive heart failure 3. Atrial fibrillation  Plan: Start Lexapro 10 mg once a day. Encouraged psychotherapy. Continue current medications. Will return in 3 weeks for recheck on Lexapro and sooner as needed. I will order a bone density scan since she's due. Lab sent to rule out any underlying causes of depressi Heon.

## 2012-08-25 ENCOUNTER — Encounter (HOSPITAL_COMMUNITY): Payer: Medicare Other

## 2012-08-25 ENCOUNTER — Ambulatory Visit: Payer: Medicare Other | Admitting: Physician Assistant

## 2012-08-28 ENCOUNTER — Encounter (HOSPITAL_COMMUNITY): Payer: Medicare Other

## 2012-08-28 NOTE — Telephone Encounter (Signed)
Discussed with Dr. Cato Mulligan.

## 2012-08-29 ENCOUNTER — Encounter: Payer: Medicare Other | Admitting: Physical Therapy

## 2012-08-30 ENCOUNTER — Ambulatory Visit (INDEPENDENT_AMBULATORY_CARE_PROVIDER_SITE_OTHER): Payer: Medicare Other | Admitting: Neurology

## 2012-08-30 ENCOUNTER — Encounter (HOSPITAL_COMMUNITY): Payer: Medicare Other

## 2012-08-30 ENCOUNTER — Encounter: Payer: Self-pay | Admitting: Neurology

## 2012-08-30 VITALS — BP 114/60 | HR 80 | Temp 97.9°F | Ht 67.5 in | Wt 144.0 lb

## 2012-08-30 DIAGNOSIS — R279 Unspecified lack of coordination: Secondary | ICD-10-CM

## 2012-08-30 DIAGNOSIS — R270 Ataxia, unspecified: Secondary | ICD-10-CM

## 2012-08-30 MED ORDER — METHYLPREDNISOLONE 4 MG PO TABS: 4.0000 mg | ORAL_TABLET | Freq: Every day | ORAL | Status: DC

## 2012-08-30 NOTE — Patient Instructions (Addendum)
Follow up with Dr. Smiley Houseman in 3 weeks.

## 2012-08-30 NOTE — Progress Notes (Signed)
[name] [name] is a 79 year old woman with a history of myocardial infarction and status post CABG.  Following CABG she had atrial fibrillation, currently she is back in her usual rhythm normal sinus rhythm with a bundle branch block.  She has been widowed for 3 years and she has 3 children.  She is with her daughter today who is a Engineer, civil (consulting). On Saturday March 30, she got out of bed like usual and she was bending over to pull out the trash can and she felt dizzy. She may have lost her balance. She grabbed onto the counter try to hold on and then she has a space of not being aware of exactly what happened for a couple of seconds. Then she remembers being on her hands and knees on the floor.  She didnt remember hitting the floor.  When her daughter came and checked her out, she had bruised her right knee and hip and also had a pretty good not on the back right side of the head, near the mastoid or occipital bone.  The way they reconstructed the  Scene, she may have hit her head on open cabinet on the way down.  After that she got in the bed for about 3 hours and then she felt pretty good except for the knot on her head.  Then 2 days later on April 1, she got up and she felt lightheaded and imbalanced.  She felt like she was going to fall. She felt very unsteady.  She went and got into bed and she called her family members.  She did not pass out.  It wasn't really the same kind of dizziness feeling that she fell 2 days before.  She was brought to the hospital and she had to lay flat in a car so that she wouldn't feel dizzy.  She stayed in the hospital about 3 days where she had a series of tests. She mostly stays in bed with her head up 30 and she did experience a lot of dizziness at that time.  She did do physical therapy which may have helped slightly for her balance but since she got back, her balance has been poor and she feels like her gait is wide based. She doesn't feel anything like she did on April 1, but she does  feel slightly unsteady and lightheaded when she is standing up. As long as she is seated on something stable or lying down, she doesn't really have the symptoms.  She notices that she has to get up at night to go the bathroom and somewhat dark she has to hold the wall and the furniture on the way and that was not the case prior to this.   She has been noticing in recent months that she is a little more forgetful  Of her short-term memory.  The MRI scan in the hospital shows small vessel disease changes as well as a slight degree of global atrophy without hydrocephalus but no focal stroke or tumor or abnormality in the vestibular area is noted. Comment was made of a bone spur at C4-5 which is causing some flattening of the spinal cord at that level.  EEG did not show any seizure activity. She had 8-9 Hz background and some superimposed 1-3 Hz delta activity consistent with mild slowing, but diagnostically nonspecific.  Inconsistently she did so some degree of orthostasis when she was checked in the hospital.  Since being home she has also noticed increased urinary urgency during the day but  not necessarily at night.  She's also tried hydrate more which is if anything exacerbated her urinary urgency issue, but is manageable so far.  She denies numbness of the extremities or weakness of the extremities that she is aware of.  Review of systems is positive for the short-term memory loss issue but no neck pain and no paresthesias and no further headache at this time other than a light sensation in the right occipital area near where she had hit her head originally.  Remainder of the review of systems is negative.  Past Medical History  Diagnosis Date  . MELANOMA 09/06/2008  . HYPERLIPIDEMIA 10/06/2007  . HYPERTENSION 10/06/2007  . MYOCARDIAL INFARCTION, HX OF 10/06/2007  . CORONARY ARTERY DISEASE 10/06/2007  . LBBB 09/06/2008  . Atrial fibrillation 10/06/2007  . VARICOSE VEIN 09/29/2009  . UTERINE  CANCER, HX OF 09/06/2008  . PERSONAL HX COLONIC POLYPS 10/30/2009  . Shortness of breath     Current Outpatient Prescriptions on File Prior to Visit  Medication Sig Dispense Refill  . amoxicillin (AMOXIL) 500 MG capsule One hour prior to dental appt  4 capsule  0  . atorvastatin (LIPITOR) 20 MG tablet Take 20 mg by mouth daily.      . clopidogrel (PLAVIX) 75 MG tablet Take 1 tablet (75 mg total) by mouth daily.  90 tablet  3  . docusate sodium (COLACE) 100 MG capsule Take 100 mg by mouth 2 (two) times daily.      Marland Kitchen escitalopram (LEXAPRO) 10 MG tablet Take 1 tablet (10 mg total) by mouth daily.  30 tablet  1  . nitroGLYCERIN (NITROLINGUAL) 0.4 MG/SPRAY spray Place 1 spray under the tongue every 5 (five) minutes as needed.  12 g  8  . ramipril (ALTACE) 5 MG capsule Take 1 capsule (5 mg total) by mouth daily.  90 capsule  3   No current facility-administered medications on file prior to visit.   Nitroglycerin  History   Social History  . Marital Status: Widowed    Spouse Name: N/A    Number of Children: N/A  . Years of Education: N/A   Occupational History  . Not on file.   Social History Main Topics  . Smoking status: Never Smoker   . Smokeless tobacco: Never Used  . Alcohol Use: Yes     Comment: daily gin or wine  . Drug Use: No  . Sexually Active: Not on file   Other Topics Concern  . Not on file   Social History Narrative  . No narrative on file    Family History  Problem Relation Age of Onset  . Stroke Mother   . Heart attack Father   . Colon cancer Neg Hx     Alert and oriented x 3.  Memory function appears to be intact.  Concentration and attention are normal for educational level and background.  Speech is fluent and without significant word finding difficulty.  Is aware of current events.  No carotid bruits detected.  Cranial nerve II through XII are within normal limits.  This includes normal optic discs and acuity, EOMI, PERLA, facial movement and sensation  intact, hearing grossly intact, gag intact,Uvula raises symmetrically and tongue protrudes evenly. Motor strength is 5 over 5 throughout all limbs.  No atrophy, abnormal tone or tremors. Reflexes are 2+ and symmetric in the upperextremities and about 3+ at the knees with crossed adductor response and 1-2+ at the ankles. Sensory exam is intact. Coordination is intact for fine  movements and rapid alternating movements in all limbs Gait and station reveals a wide-based gait with a positive Romberg.  Impression: 1. She had a brief dizzy episode on March 30 resulting in a fall and possibly an injury to the back of the head where she suffered a goose egg sixed knot .  Because of this is unknown. 2. Truncal ataxia with gait disturbance.  2 days after the initial head injury and fall, she experienced increased dizziness and difficulty walking which has persisted to a significant degree.  I suspect that there was some swelling resulting in symptoms.  The swelling could have been at the site of the injury on the head or she may have exacerbated the spinal stenosis noted at C4-5 where there is a bone spur.  She does show some hyperreflexia in the lower extremities there is subtle and she did experience some urinary urgency which could point to long tract signs from a bruise of the cervical spinal cord.  This could also have resulted in the wide-based gait and truncal ataxia. 3. Mild global atrophy and small vessel disease changes.  This could explain the mild memory loss and the mild slowing on EEG.  Plan: 1. We will try a steroid pack in case there could be some swelling around the spinal stenosis at C4-5 caused by the fall. 2. Return in 3 weeks for followup.

## 2012-08-31 ENCOUNTER — Encounter: Payer: Medicare Other | Admitting: Physical Therapy

## 2012-09-01 ENCOUNTER — Encounter (HOSPITAL_COMMUNITY): Payer: Medicare Other

## 2012-09-01 ENCOUNTER — Ambulatory Visit (INDEPENDENT_AMBULATORY_CARE_PROVIDER_SITE_OTHER)
Admission: RE | Admit: 2012-09-01 | Discharge: 2012-09-01 | Disposition: A | Discharge status: Home / Self Care | Payer: Medicare Other | Source: Ambulatory Visit | Attending: Internal Medicine | Admitting: Internal Medicine

## 2012-09-01 DIAGNOSIS — Z780 Asymptomatic menopausal state: Secondary | ICD-10-CM

## 2012-09-06 ENCOUNTER — Encounter: Payer: Medicare Other | Admitting: Physical Therapy

## 2012-09-06 ENCOUNTER — Encounter (HOSPITAL_COMMUNITY): Payer: Medicare Other

## 2012-09-08 ENCOUNTER — Encounter (HOSPITAL_COMMUNITY): Payer: Medicare Other

## 2012-09-08 ENCOUNTER — Encounter: Payer: Medicare Other | Admitting: Physical Therapy

## 2012-09-11 ENCOUNTER — Encounter (HOSPITAL_COMMUNITY): Payer: Medicare Other

## 2012-09-11 ENCOUNTER — Encounter: Payer: Medicare Other | Admitting: Physical Therapy

## 2012-09-13 ENCOUNTER — Ambulatory Visit (INDEPENDENT_AMBULATORY_CARE_PROVIDER_SITE_OTHER): Payer: Medicare Other | Admitting: Family

## 2012-09-13 ENCOUNTER — Encounter: Payer: Self-pay | Admitting: Family

## 2012-09-13 ENCOUNTER — Encounter (HOSPITAL_COMMUNITY): Payer: Medicare Other

## 2012-09-13 ENCOUNTER — Encounter: Payer: Medicare Other | Admitting: Physical Therapy

## 2012-09-13 VITALS — BP 142/78 | HR 93 | Wt 145.0 lb

## 2012-09-13 DIAGNOSIS — R42 Dizziness and giddiness: Secondary | ICD-10-CM

## 2012-09-13 DIAGNOSIS — F329 Major depressive disorder, single episode, unspecified: Secondary | ICD-10-CM

## 2012-09-13 DIAGNOSIS — I4891 Unspecified atrial fibrillation: Secondary | ICD-10-CM

## 2012-09-13 NOTE — Progress Notes (Signed)
Subjective:    Patient ID: [name] [name], female    DOB: [birth year], 79 y.o.   MRN: [id number]  HPI  Pt is a 79 yo white female who presents to PCP for follow up after being started on Lexapro. Pt reports moderate improvement in mood. Expresses frustration with Holter monitor. Reports she has recently been to the neurologist for symptoms of dizziness and memory loss post fall. Per neurologist edema was noted around C4-C5 and pt was placed on Medrol pack. Pt expressed symptom relief upon initiation of steroid therapy, but states symptoms of dizziness still occur when bending down or being exposed to bright lights. States she also has a referral to endocrinologist, but is unclear of reason why.  Review of Systems  Constitutional: Negative.   HENT: Negative.   Eyes: Negative.   Respiratory: Negative.   Cardiovascular: Negative.   Endocrine: Negative.   Genitourinary: Negative.   Musculoskeletal: Negative.   Skin: Negative.   Allergic/Immunologic: Negative.   Neurological: Positive for dizziness.  Hematological: Negative.   Psychiatric/Behavioral: Negative.        Past Medical History  Diagnosis Date  . MELANOMA 09/06/2008  . HYPERLIPIDEMIA 10/06/2007  . HYPERTENSION 10/06/2007  . MYOCARDIAL INFARCTION, HX OF 10/06/2007  . CORONARY ARTERY DISEASE 10/06/2007  . LBBB 09/06/2008  . Atrial fibrillation 10/06/2007  . VARICOSE VEIN 09/29/2009  . UTERINE CANCER, HX OF 09/06/2008  . PERSONAL HX COLONIC POLYPS 10/30/2009  . Shortness of breath     History   Social History  . Marital Status: Widowed    Spouse Name: N/A    Number of Children: N/A  . Years of Education: N/A   Occupational History  . Not on file.   Social History Main Topics  . Smoking status: Never Smoker   . Smokeless tobacco: Never Used  . Alcohol Use: Yes     Comment: daily gin or wine  . Drug Use: No  . Sexually Active: Not on file   Other Topics Concern  . Not on file   Social History Narrative  . No  narrative on file    Past Surgical History  Procedure Laterality Date  . Oophorectomy    . Right distal pretibeal      melanoma  . Coronary angioplasty with stent placement  2009  . Coronary artery bypass graft  2004  . Abdominal hysterectomy  1988  . Cardiac catheterization  08/30/2008    Family History  Problem Relation Age of Onset  . Stroke Mother   . Heart attack Father   . Colon cancer Neg Hx     Allergies  Allergen Reactions  . Nitroglycerin Other (See Comments)    sensitive to tabs which causes rapid drop in blood pressure. Is ok to use oral spray form    Current Outpatient Prescriptions on File Prior to Visit  Medication Sig Dispense Refill  . atorvastatin (LIPITOR) 20 MG tablet Take 20 mg by mouth daily.      . clopidogrel (PLAVIX) 75 MG tablet Take 1 tablet (75 mg total) by mouth daily.  90 tablet  3  . docusate sodium (COLACE) 100 MG capsule Take 100 mg by mouth 2 (two) times daily.      Marland Kitchen escitalopram (LEXAPRO) 10 MG tablet Take 1 tablet (10 mg total) by mouth daily.  30 tablet  1  . methylPREDNISolone (MEDROL) 4 MG tablet Take 1 tablet (4 mg total) by mouth daily.  21 tablet  0  . nitroGLYCERIN (NITROLINGUAL) 0.4 MG/SPRAY spray  Place 1 spray under the tongue every 5 (five) minutes as needed.  12 g  8  . ramipril (ALTACE) 5 MG capsule Take 1 capsule (5 mg total) by mouth daily.  90 capsule  3  . amoxicillin (AMOXIL) 500 MG capsule One hour prior to dental appt  4 capsule  0   No current facility-administered medications on file prior to visit.    BP 142/78  Pulse 93  Wt 145 lb (65.772 kg)  BMI 22.36 kg/m2  SpO2 98%chart Objective:   Physical Exam  Constitutional: She is oriented to person, place, and time. She appears well-developed and well-nourished.  HENT:  Head: Normocephalic and atraumatic.  Eyes: Pupils are equal, round, and reactive to light.  Neck: Normal range of motion.  Cardiovascular: Normal rate and regular rhythm.   Pulmonary/Chest:  Effort normal and breath sounds normal.  Abdominal: Soft. Bowel sounds are normal.  Musculoskeletal: Normal range of motion.  Neurological: She is alert and oriented to person, place, and time.  Skin: Skin is warm and dry.          Assessment & Plan:  1. Depression Pt is to continue on Lexapro. PCP to contact daughter in order to gain clarity on referral to endocrinologist. Instructed to follow up with neurology and contact PCP if necessary

## 2012-09-15 ENCOUNTER — Ambulatory Visit (INDEPENDENT_AMBULATORY_CARE_PROVIDER_SITE_OTHER): Payer: Medicare Other | Admitting: Physician Assistant

## 2012-09-15 ENCOUNTER — Encounter: Payer: Self-pay | Admitting: Physician Assistant

## 2012-09-15 ENCOUNTER — Encounter (HOSPITAL_COMMUNITY): Payer: Medicare Other

## 2012-09-15 VITALS — BP 105/56 | HR 68 | Ht 67.5 in | Wt 144.4 lb

## 2012-09-15 DIAGNOSIS — E785 Hyperlipidemia, unspecified: Secondary | ICD-10-CM

## 2012-09-15 DIAGNOSIS — I498 Other specified cardiac arrhythmias: Secondary | ICD-10-CM

## 2012-09-15 DIAGNOSIS — I10 Essential (primary) hypertension: Secondary | ICD-10-CM

## 2012-09-15 DIAGNOSIS — I447 Left bundle-branch block, unspecified: Secondary | ICD-10-CM

## 2012-09-15 DIAGNOSIS — I471 Supraventricular tachycardia: Secondary | ICD-10-CM

## 2012-09-15 DIAGNOSIS — I2510 Atherosclerotic heart disease of native coronary artery without angina pectoris: Secondary | ICD-10-CM

## 2012-09-15 DIAGNOSIS — R42 Dizziness and giddiness: Secondary | ICD-10-CM

## 2012-09-15 DIAGNOSIS — I428 Other cardiomyopathies: Secondary | ICD-10-CM

## 2012-09-15 MED ORDER — RAMIPRIL 2.5 MG PO CAPS: 2.5000 mg | ORAL_CAPSULE | Freq: Every day | ORAL | Status: DC

## 2012-09-15 NOTE — Progress Notes (Signed)
[address] [place], [place]  [address] Phone: [phone] Fax:  [phone]  Date:  09/15/2012   ID:  [name] [name], DOB [birth year], MRN [id number]  PCP:  Judie Petit, MD  Cardiologist:  Dr. Valera Castle     History of Present Illness: [name] [name] is a 79 y.o. female who returns for follow up after recent admission to the hospital.  She has a hx of CAD, s/p prior PCI to the LAD, s/p CABG, LBBB, HTN, HL, post op AFib.  Previous EF had been normal.  She was admitted 4/1-4/4. She presented with symptoms of near syncope. Patient reports episode of syncope several days prior to this.  She had stood to walk to the kitchen.  She denies a prodrome.  She thinks she hit her head on the countertop.  She did have a knot on the back of her head.  Orthostatic vital signs were unremarkable according to the d/c summary. Head CT was without significant abnormalities. MRI demonstrated no acute infarct. There was prominent small vessel disease type changes, global atrophy without hydrocephalus. There was some spinal stenosis and cord flattening at C4-5 level.  MRA demonstrated diminutive size of the left vertebral artery, no significant stenosis of the basilar artery. Carotid Dopplers were negative for ICA stenosis. EEG was unremarkable.  Echocardiogram 07/12/12: EF 25-30%, diffuse HK, mild AI, mild MR, mild LAE. Cardiology was consulted. Patient was seen by Dr. Daleen Squibb. Cardiac catheterization was recommended. Of note, cardiac markers were normal. LHC 07/13/12: Proximal LAD stent patent, LIMA-LAD atretic, D1 occluded, proximal circumflex 30%, mid RCA occluded, SVG-D1 normal, SVG-OM2 normal, SVG-distal RCA normal, EF 40% with diffuse HK. Patient was followed by Dr. Eden Emms who recommended followup MRI. Patient's EDP at was low and she did not appear to need a diuretic. Patient has been set up for an outpatient event monitor. She was apparently taken off of her beta blocker during her admission. No  arrhythmias noted on telemetry. She was seen by physical therapy who recommended outpatient vestibular rehabilitation.  Patient was taken out of PT due to orthostatic hypotension.  I have reviewed her strips from her monitor.  This demonstrates NSR, sinus tachy.  There are 2 episodes of SVT noted.  One episode on 5/21 demonstrates a HR of 154.  She denies further syncope.  She has fairly constant dizziness.  This is made worse by standing.  She denies near syncope.  She does feel lightheaded.  She did see Dr. Smiley Houseman with neurology.  She was given a prednisone taper due to the findings noted on MRI at C4-5.  She states she felt better for 1-2 days, but then the dizziness returned.  She denies CP.  She does note dyspnea with more extreme activities.  She is NYHA Class IIb.  She denies orthopnea, PND, edema.  No palpitations.   Labs (4/14):  K 4.1, Cr 0.72, LDL 78, Hgb 13.8, Plt 117K, Blood cx's x 2 with no growth Labs (5/14):  K 4.5, Cr 0.7, TSH 0.45  Wt Readings from Last 3 Encounters:  09/13/12 145 lb (65.772 kg)  08/30/12 144 lb (65.318 kg)  08/23/12 146 lb (66.225 kg)     Past Medical History  Diagnosis Date  . MELANOMA 09/06/2008  . HYPERLIPIDEMIA 10/06/2007  . HYPERTENSION 10/06/2007  . MYOCARDIAL INFARCTION, HX OF 10/06/2007  . CORONARY ARTERY DISEASE 10/06/2007    a. s/p PCI to LAD; b. s/p CABG; c. LHC 07/13/12: Proximal LAD stent patent, LIMA-LAD atretic, D1 occluded,  proximal circumflex 30%, mid RCA occluded, SVG-D1 normal, SVG-OM2 normal, SVG-distal RCA normal, EF 40% with diffuse HK  . LBBB 09/06/2008  . Atrial fibrillation 10/06/2007    post op  . VARICOSE VEIN 09/29/2009  . UTERINE CANCER, HX OF 09/06/2008  . PERSONAL HX COLONIC POLYPS 10/30/2009  . Syncope   . Dizziness   . NICM (nonischemic cardiomyopathy)     Echocardiogram 07/12/12: EF 25-30%, diffuse HK, mild AI, mild MR, mild LAE    Current Outpatient Prescriptions  Medication Sig Dispense Refill  . amoxicillin (AMOXIL) 500 MG  capsule One hour prior to dental appt  4 capsule  0  . atorvastatin (LIPITOR) 20 MG tablet Take 20 mg by mouth daily.      . clopidogrel (PLAVIX) 75 MG tablet Take 1 tablet (75 mg total) by mouth daily.  90 tablet  3  . docusate sodium (COLACE) 100 MG capsule Take 100 mg by mouth 2 (two) times daily.      Marland Kitchen escitalopram (LEXAPRO) 10 MG tablet Take 1 tablet (10 mg total) by mouth daily.  30 tablet  1  . methylPREDNISolone (MEDROL) 4 MG tablet Take 1 tablet (4 mg total) by mouth daily.  21 tablet  0  . nitroGLYCERIN (NITROLINGUAL) 0.4 MG/SPRAY spray Place 1 spray under the tongue every 5 (five) minutes as needed.  12 g  8  . ramipril (ALTACE) 5 MG capsule Take 1 capsule (5 mg total) by mouth daily.  90 capsule  3   No current facility-administered medications for this visit.    Allergies:    Allergies  Allergen Reactions  . Nitroglycerin Other (See Comments)    sensitive to tabs which causes rapid drop in blood pressure. Is ok to use oral spray form    Social History:  The patient  reports that she has never smoked. She has never used smokeless tobacco. She reports that  drinks alcohol. She reports that she does not use illicit drugs.   ROS:  Please see the history of present illness.   She notes dark stools but no melena.  She has a mild cough.   All other systems reviewed and negative.   PHYSICAL EXAM: VS:  BP 132/72  Pulse 73  Ht 5' 7.5" (1.715 m)  Wt 144 lb 6.4 oz (65.499 kg)  BMI 22.27 kg/m2  Filed Vitals:   09/15/12 1321 09/15/12 1323 09/15/12 1324 09/15/12 1325  BP: 109/56 97/53 103/60 105/56  Pulse: 67 63 70 68  Height:      Weight:        Well nourished, well developed, in no acute distress HEENT: normal Neck: no JVD Cardiac:  normal S1, S2; RRR; no murmur Lungs:  clear to auscultation bilaterally, no wheezing, rhonchi or rales Abd: soft, nontender, no hepatomegaly Ext: no edema Skin: warm and dry Neuro:  CNs 2-12 intact, no focal abnormalities noted  EKG:  NSR,  HR 73, LBBB     ASSESSMENT AND PLAN:  1. Cardiomyopathy:  Etiology not clear.  Cardiac cath with stable anatomy and good revascularization.  No evidence of speckled myocardium on echo.  D/w Dr. Charlton Haws.  He recommends proceeding with MRI now to quantify EF and rule out infiltrative CM.  I will arrange MRI.  Hold off on restarting beta blocker for now.  See below. 2. Dizziness:  Etiology not clear.  Suspect neurologic cause.  She is already seeing neurology.  BP is somewhat low on orthostatic VS.  No significant BP drop or  HR increase.  She does have 2 episodes of SVT on her monitor.  At this point, I am not certain this is the sole cause of her dizziness.  I will decrease Altace to 2.5 mg QD.  Hold off on restarting beta blocker for now.  I will give her compression stockings to wear to see if this helps as she has apparently had some drops in her BP from lying to standing in the past.  Continue f/u with neurology.  Will refer to EP.  She could resume vestibular rehab from cardiac standpoint if she desires.  3. SVT:  I would rather put her back on her metoprolol.  But, with her dizziness and low BP, I am hesitant to do this.  Will hold off for now.  D/w Dr. Eden Emms.  Given her low EF, LBBB and SVT on her monitor, will go ahead and refer to EP. 4. CAD:  Recent cath with stable anatomy.  Continue Plavix, statin. 5. Hypertension:  BP somewhat low.  Question if this is contributing to dizziness.  Decrease Altace to 2.5 mg QD. 6. Hyperlipidemia:  Continue statin.  7. Disposition:  F/u with Dr. Valera Castle next month as planned. Refer to EP as noted.   Signed, Tereso Newcomer, PA-C  09/15/2012 12:02 PM

## 2012-09-15 NOTE — Patient Instructions (Addendum)
DECREASE ALTACE TO 2.5 MG DAILY;  A NEW RX WAS SENT IN TODAY  PLEASE SCHEDULE A CARDIAC MRI TO BE DONE AT [facility] WITH DR. Eden Emms TO READ; DX CARDIOMYOPATHY  YOU HAVE BEEN GIVEN AN ORDER FOR COMPRESSION STOCKINGS   You have been referred to ELECTROPHYSIOLOGY WHICH EVER DOCTOR CAN SEE YOU 1ST;  DX SVT, CARDIOMYOPATHY  KEEP YOUR FOLLOW UP WITH DR. WALL IN 10/2012

## 2012-09-18 ENCOUNTER — Encounter (HOSPITAL_COMMUNITY): Payer: Medicare Other

## 2012-09-19 ENCOUNTER — Ambulatory Visit (INDEPENDENT_AMBULATORY_CARE_PROVIDER_SITE_OTHER): Payer: Medicare Other | Admitting: Internal Medicine

## 2012-09-19 ENCOUNTER — Encounter: Payer: Self-pay | Admitting: Internal Medicine

## 2012-09-19 ENCOUNTER — Encounter: Payer: Self-pay | Admitting: Physician Assistant

## 2012-09-19 VITALS — BP 122/68 | Temp 98.2°F | Wt 144.0 lb

## 2012-09-19 DIAGNOSIS — F329 Major depressive disorder, single episode, unspecified: Secondary | ICD-10-CM | POA: Insufficient documentation

## 2012-09-19 DIAGNOSIS — F411 Generalized anxiety disorder: Secondary | ICD-10-CM

## 2012-09-19 DIAGNOSIS — I4891 Unspecified atrial fibrillation: Secondary | ICD-10-CM

## 2012-09-19 DIAGNOSIS — R55 Syncope and collapse: Secondary | ICD-10-CM

## 2012-09-19 DIAGNOSIS — F32A Depression, unspecified: Secondary | ICD-10-CM | POA: Insufficient documentation

## 2012-09-19 DIAGNOSIS — E876 Hypokalemia: Secondary | ICD-10-CM

## 2012-09-19 MED ORDER — ESCITALOPRAM OXALATE 10 MG PO TABS: 10.0000 mg | ORAL_TABLET | Freq: Every day | ORAL | Status: DC

## 2012-09-19 MED ORDER — AMOXICILLIN 500 MG PO CAPS: ORAL_CAPSULE | ORAL | Status: DC

## 2012-09-19 NOTE — Progress Notes (Signed)
Patient ID: [name] [name], female   DOB: 1931/06/22, 79 y.o.   MRN: [id number] Complicated patient.. Dizzy.Marland Kitchen. ? Cause. Seeing cardiology and neurology.  Has had BP meds monitored and lowered.   Reviewed cv note from last Friday.  She is also wearing compressive stockings.   Typically she feels "dizzy" in the a.m. She describes this as lightheadedness and denies "room spinning".   She admits to anxiety as a "part" of her whole syndrome.   Reviewed pmh, psh, sochx  Reviewed meds.   patient denies chest pain, shortness of breath, orthopnea. Denies lower extremity edema, abdominal pain, change in appetite, change in bowel movements. Patient denies rashes, musculoskeletal complaints. No other specific complaints in a complete review of systems.    Well-developed well-nourished female in no acute distress. HEENT exam atraumatic, normocephalic, extraocular muscles are intact. Neck is supple. No jugular venous distention no thyromegaly. Chest clear to auscultation without increased work of breathing. Cardiac exam S1 and S2 are regular. Abdominal exam active bowel sounds, soft, nontender. Extremities no edema. Neurologic exam she is alert without any motor sensory deficits. Gait is normal.

## 2012-09-19 NOTE — Assessment & Plan Note (Signed)
Lab Results  Component Value Date   K 4.5 08/23/2012   Recently checked

## 2012-09-19 NOTE — Patient Instructions (Signed)
Lexapro:  1/2 tablet every day for 2 weeks and then 1/2 tablet every other day for 2 weeks and then stop.  Call if any concerning symptoms arise while you are tapering dose

## 2012-09-19 NOTE — Assessment & Plan Note (Signed)
Will decrease lexapro-- see patient instructions for taper

## 2012-09-19 NOTE — Assessment & Plan Note (Signed)
She has not had syncope but has continued lightheadedness.  Will try to decrease lexapro

## 2012-09-19 NOTE — Assessment & Plan Note (Signed)
Currently in SR

## 2012-09-20 ENCOUNTER — Encounter (HOSPITAL_COMMUNITY): Payer: Medicare Other

## 2012-09-21 ENCOUNTER — Telehealth: Payer: Self-pay | Admitting: Cardiology

## 2012-09-21 NOTE — Telephone Encounter (Signed)
Follow Up     Pt calling in following up about her MRI appointment. States she has not received her appt yet and would like to speak to nurse.

## 2012-09-21 NOTE — Telephone Encounter (Signed)
Gesila schedules these appointments. I will forward

## 2012-09-22 ENCOUNTER — Encounter (HOSPITAL_COMMUNITY): Payer: Medicare Other

## 2012-09-25 ENCOUNTER — Encounter (HOSPITAL_COMMUNITY): Payer: Medicare Other

## 2012-09-25 ENCOUNTER — Ambulatory Visit: Payer: Medicare Other | Admitting: Neurology

## 2012-09-27 ENCOUNTER — Encounter: Payer: Self-pay | Admitting: Neurology

## 2012-09-27 ENCOUNTER — Telehealth: Payer: Self-pay | Admitting: *Deleted

## 2012-09-27 ENCOUNTER — Ambulatory Visit: Payer: Medicare Other | Admitting: Neurology

## 2012-09-27 ENCOUNTER — Encounter (HOSPITAL_COMMUNITY): Payer: Medicare Other

## 2012-09-27 VITALS — BP 118/60 | HR 84 | Temp 98.6°F | Ht 67.5 in | Wt 143.7 lb

## 2012-09-27 DIAGNOSIS — M4712 Other spondylosis with myelopathy, cervical region: Secondary | ICD-10-CM

## 2012-09-27 MED ORDER — PREDNISONE 10 MG PO KIT: PACK | ORAL | Status: DC

## 2012-09-27 NOTE — Telephone Encounter (Signed)
Message copied by Tarri Fuller on Wed Sep 27, 2012 10:43 AM ------      Message from: [place], [place] T      Created: Wed Sep 27, 2012  8:11 AM      Regarding: RE: Cardiac MRI       [employer] will not pay for her cardiac MRI.      I spoke to Dr. Lindie Spruce.      Please cancel MRI.            Cindee Lame, information is below if you think you can help get it approved.      Thanks      Tereso Newcomer, PA-C        09/27/2012 8:13 AM              ----- Message -----         From: Carmelina Paddock         Sent: 09/25/2012   7:08 AM           To: Beatrice Lecher, PA-C, Tarri Fuller, CMA      Subject: Cardiac MRI                                              Does not meet guidelines per [facility]. To be covered under [facility] policy with diagnosis of cardiomyopathy patient must have history of sarcoidosis,amyloidosis or have taken a cardiotoxic drug.            She is scheduled 10/02/12            Will need MD review.            Please call [phone] option 3      Case # [phone]      [name] [name] [birth year]      Case will close 09/29/12.            If you would like me to cancel and wait until she sees Dr. Graciela Husbands please let me know and I will take care of it.            Thanks,            Morrie Sheldon       ------

## 2012-09-27 NOTE — Patient Instructions (Addendum)
Follow-up in six months.

## 2012-09-27 NOTE — Telephone Encounter (Signed)
pt notified about Cardiac MRI cancelled due to [facility] denial. pt asked how much does it cost and I transfered pt to [facility] in our billing dept. pt said thank you.

## 2012-09-27 NOTE — Progress Notes (Unsigned)
[name] [name] returns for followup visit after 3 weeks.  Please see her initial note for further details of the history of present illness.  To summarize, she did have a falling event where she hit the back of her head.  Within a few days after this, she has developed imbalance the symptoms.  Her brain MRI showed some nonspecific atrophy without hydrocephalus.  The sagittal view also shows a spur at C4-5 with some cord impingement.  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  I theorize that she had a cervical cord contusion or bruise from the fall  At the level of the bone spur potentially.  She did improve dramatically the first 2 days of a steroid pack.  However she decreased the pills a symptoms partially returned. Overall the symptoms are not as severe as they were but they do persist.  She is also now wearing compression stockings and there was consideration of a heart MRI for her tendency to orthostasis.  Review of systems is positive for the difficulty with walking as well as lightheadedness when standing up. She is not having to use a cane or a walker at this time. She tolerated the steroids well. Remainder of review of systems is negative.  Past Medical History  Diagnosis Date  . MELANOMA 09/06/2008  . HYPERLIPIDEMIA 10/06/2007  . HYPERTENSION 10/06/2007  . MYOCARDIAL INFARCTION, HX OF 10/06/2007  . CORONARY ARTERY DISEASE 10/06/2007    a. s/p PCI to LAD; b. s/p CABG; c. LHC 07/13/12: Proximal LAD stent patent, LIMA-LAD atretic, D1 occluded, proximal circumflex 30%, mid RCA occluded, SVG-D1 normal, SVG-OM2 normal, SVG-distal RCA normal, EF 40% with diffuse HK  . LBBB 09/06/2008  . Atrial fibrillation 10/06/2007    post op  . VARICOSE VEIN 09/29/2009  . UTERINE CANCER, HX OF 09/06/2008  . PERSONAL HX COLONIC POLYPS 10/30/2009  . Syncope   . Dizziness   . NICM (nonischemic cardiomyopathy)     Echocardiogram 07/12/12: EF 25-30%, diffuse HK, mild AI, mild MR, mild LAE    Current  Outpatient Prescriptions on File Prior to Visit  Medication Sig Dispense Refill  . amoxicillin (AMOXIL) 500 MG capsule One hour prior to dental appt  4 capsule  0  . atorvastatin (LIPITOR) 20 MG tablet Take 20 mg by mouth daily.      . clopidogrel (PLAVIX) 75 MG tablet Take 1 tablet (75 mg total) by mouth daily.  90 tablet  3  . docusate sodium (COLACE) 100 MG capsule Take 100 mg by mouth 2 (two) times daily as needed.       Marland Kitchen escitalopram (LEXAPRO) 10 MG tablet Take 1 tablet (10 mg total) by mouth daily.  30 tablet  1  . nitroGLYCERIN (NITROLINGUAL) 0.4 MG/SPRAY spray Place 1 spray under the tongue every 5 (five) minutes as needed.  12 g  8  . ramipril (ALTACE) 2.5 MG capsule Take 1 capsule (2.5 mg total) by mouth daily.  90 capsule  3   No current facility-administered medications on file prior to visit.   Nitroglycerin  History   Social History  . Marital Status: Widowed    Spouse Name: N/A    Number of Children: N/A  . Years of Education: N/A   Occupational History  . Not on file.   Social History Main Topics  . Smoking status: Never Smoker   . Smokeless tobacco: Never Used  . Alcohol Use: Yes     Comment: daily gin or wine  . Drug Use:  No  . Sexually Active: Not on file   Other Topics Concern  . Not on file   Social History Narrative  . No narrative on file    Family History  Problem Relation Age of Onset  . Stroke Mother   . Heart attack Father   . Colon cancer Neg Hx     BP 118/60  Pulse 84  Temp(Src) 98.6 F (37 C)  Ht 5' 7.5" (1.715 m)  Wt 143 lb 11.2 oz (65.182 kg)  BMI 22.16 kg/m2   Alert and oriented x 3.  Memory function appears to be intact.  Concentration and attention are normal for educational level and background.  Speech is fluent and without significant word finding difficulty.  Is aware of current events.  No carotid bruits detected.  Cranial nerve II through XII are within normal limits.  This includes normal optic discs and acuity, EOMI,  PERLA, facial movement and sensation intact, hearing grossly intact, gag intact,Uvula raises symmetrically and tongue protrudes evenly. Motor strength is 5 over 5 throughout all limbs.  No atrophy, abnormal tone or tremors. Reflexes are 2+ and symmetric in the upper  Extremities.  She is basically 2+ at the knees as well with no evidence of a crossed adductor today it was previously seen. Ankle reflexes are diminished. Sensory exam is intact. Coordination is intact for fine movements and rapid alternating movements in all limbs Gait and station reveals a mildly wide-based gait negative Romberg test and she is overall improved compared to your previous visit 3 weeks ago.   Impression: 1. 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  She did improve dramatically at the beginning of a 6 to a steroid pack.  This certainly suggests that there is some swelling involvied, possibly of the cervical cord.  Plan: 1. We discussed doing further studies including a full cervical MRI.  The patient wishes to wait on this as she is slowly improving. 2. We will order a 12 day steroid pack to see she gets any further improvement or if the improvement again occurs with a short course of steroids at least transiently. 3. Return in 6 months for recheck.  If she worsens, but I again explained to her that I would recommend reevaluation and probably a cervical MRI depending on if there are any new symptoms or signs.

## 2012-09-29 ENCOUNTER — Encounter (HOSPITAL_COMMUNITY): Payer: Medicare Other

## 2012-10-02 ENCOUNTER — Ambulatory Visit (HOSPITAL_COMMUNITY): Payer: Medicare Other

## 2012-10-02 ENCOUNTER — Encounter (HOSPITAL_COMMUNITY): Payer: Medicare Other

## 2012-10-04 ENCOUNTER — Encounter (HOSPITAL_COMMUNITY): Payer: Medicare Other

## 2012-10-04 ENCOUNTER — Emergency Department (HOSPITAL_COMMUNITY)
Admission: EM | Admit: 2012-10-04 | Discharge: 2012-10-04 | Disposition: A | Discharge status: Home / Self Care | Payer: Medicare Other | Attending: Emergency Medicine | Admitting: Emergency Medicine

## 2012-10-04 ENCOUNTER — Emergency Department (HOSPITAL_COMMUNITY): Payer: Medicare Other

## 2012-10-04 ENCOUNTER — Encounter (HOSPITAL_COMMUNITY): Payer: Self-pay | Admitting: Emergency Medicine

## 2012-10-04 DIAGNOSIS — E785 Hyperlipidemia, unspecified: Secondary | ICD-10-CM | POA: Insufficient documentation

## 2012-10-04 DIAGNOSIS — R42 Dizziness and giddiness: Secondary | ICD-10-CM

## 2012-10-04 DIAGNOSIS — Z860100 Personal history of colon polyps, unspecified: Secondary | ICD-10-CM | POA: Insufficient documentation

## 2012-10-04 DIAGNOSIS — Z8542 Personal history of malignant neoplasm of other parts of uterus: Secondary | ICD-10-CM | POA: Insufficient documentation

## 2012-10-04 DIAGNOSIS — Z7902 Long term (current) use of antithrombotics/antiplatelets: Secondary | ICD-10-CM | POA: Insufficient documentation

## 2012-10-04 DIAGNOSIS — Z9861 Coronary angioplasty status: Secondary | ICD-10-CM | POA: Insufficient documentation

## 2012-10-04 DIAGNOSIS — I2510 Atherosclerotic heart disease of native coronary artery without angina pectoris: Secondary | ICD-10-CM | POA: Insufficient documentation

## 2012-10-04 DIAGNOSIS — Z79899 Other long term (current) drug therapy: Secondary | ICD-10-CM | POA: Insufficient documentation

## 2012-10-04 DIAGNOSIS — I951 Orthostatic hypotension: Secondary | ICD-10-CM

## 2012-10-04 DIAGNOSIS — F411 Generalized anxiety disorder: Secondary | ICD-10-CM | POA: Insufficient documentation

## 2012-10-04 DIAGNOSIS — I10 Essential (primary) hypertension: Secondary | ICD-10-CM | POA: Insufficient documentation

## 2012-10-04 DIAGNOSIS — I252 Old myocardial infarction: Secondary | ICD-10-CM | POA: Insufficient documentation

## 2012-10-04 DIAGNOSIS — Z85820 Personal history of malignant melanoma of skin: Secondary | ICD-10-CM | POA: Insufficient documentation

## 2012-10-04 DIAGNOSIS — Z8679 Personal history of other diseases of the circulatory system: Secondary | ICD-10-CM | POA: Insufficient documentation

## 2012-10-04 LAB — BASIC METABOLIC PANEL
CO2: 28 mEq/L (ref 19–32)
Calcium: 8.9 mg/dL (ref 8.4–10.5)
Creatinine, Ser: 0.71 mg/dL (ref 0.50–1.10)
GFR calc Af Amer: >90 mL/min (ref >90)
GFR calc non Af Amer: 80 mL/min — ABNORMAL LOW (ref >90)

## 2012-10-04 LAB — POCT I-STAT TROPONIN I

## 2012-10-04 LAB — CBC
MCH: 29.3 pg (ref 26.0–34.0)
MCHC: 34.3 g/dL (ref 30.0–36.0)
MCV: 85.4 fL (ref 78.0–100.0)
Platelets: 149 10*3/uL — ABNORMAL LOW (ref 150–400)
RDW: 13.8 % (ref 11.5–15.5)

## 2012-10-04 LAB — PROTIME-INR: Prothrombin Time: 13.1 seconds (ref 11.6–15.2)

## 2012-10-04 MED ORDER — SODIUM CHLORIDE 0.9 % IV BOLUS (SEPSIS)
1000.0000 mL | Freq: Once | INTRAVENOUS | Status: AC
  Administered 2012-10-04: 1000 mL via INTRAVENOUS

## 2012-10-04 NOTE — ED Notes (Signed)
Pt c/o increased dizziness upon waking this am; pt sts felt irregular HR; pt denies CP or SOB

## 2012-10-04 NOTE — ED Notes (Signed)
Pt states she feels very lightheaded but that she is not dizzy. When taking orthostatic VS pt was very lightheaded when standing. Pt states she has a hx of lightheadedness and that no one has found the root cause of her dizziness.

## 2012-10-04 NOTE — ED Provider Notes (Signed)
History    CSN: [id number] Arrival date & time 10/04/12  0941  First MD Initiated Contact with Patient 10/04/12 1037     Chief Complaint  Patient presents with  . Dizziness   (Consider location/radiation/quality/duration/timing/severity/associated sxs/prior Treatment) The history is provided by the patient.  [name] [name] is a 79 y.o. female history of pneumonia, hyperlipidemia, hypertension, CAD, left bundle branch block, here presenting with dizziness. She has chronic dizziness since April and has been followed up with a neurologist. She was thought to have truncal ataxia likely from either cervical radiculopathy. She also had an Holter that showed occasional SVT and is scheduled for a heart MRI. Patient felt acutely worse today while in the kitchen. Denies chest pain or SOB. Has been eating and drinking well. She also has a history of orthostatic hypotension but is currently not on beta blocker or CCB.     Past Medical History  Diagnosis Date  . MELANOMA 09/06/2008  . HYPERLIPIDEMIA 10/06/2007  . HYPERTENSION 10/06/2007  . MYOCARDIAL INFARCTION, HX OF 10/06/2007  . CORONARY ARTERY DISEASE 10/06/2007    a. s/p PCI to LAD; b. s/p CABG; c. LHC 07/13/12: Proximal LAD stent patent, LIMA-LAD atretic, D1 occluded, proximal circumflex 30%, mid RCA occluded, SVG-D1 normal, SVG-OM2 normal, SVG-distal RCA normal, EF 40% with diffuse HK  . LBBB 09/06/2008  . Atrial fibrillation 10/06/2007    post op  . VARICOSE VEIN 09/29/2009  . UTERINE CANCER, HX OF 09/06/2008  . PERSONAL HX COLONIC POLYPS 10/30/2009  . Syncope   . Dizziness   . NICM (nonischemic cardiomyopathy)     Echocardiogram 07/12/12: EF 25-30%, diffuse HK, mild AI, mild MR, mild LAE   Past Surgical History  Procedure Laterality Date  . Oophorectomy    . Right distal pretibeal      melanoma  . Coronary angioplasty with stent placement  2009  . Coronary artery bypass graft  2004  . Abdominal hysterectomy  1988  . Cardiac catheterization   08/30/2008   Family History  Problem Relation Age of Onset  . Stroke Mother   . Heart attack Father   . Colon cancer Neg Hx    History  Substance Use Topics  . Smoking status: Never Smoker   . Smokeless tobacco: Never Used  . Alcohol Use: Yes     Comment: daily gin or wine   OB History   Grav Para Term Preterm Abortions TAB SAB Ect Mult Living                 Review of Systems  Neurological: Positive for dizziness.  All other systems reviewed and are negative.    Allergies  Nitroglycerin  Home Medications   Current Outpatient Rx  Name  Route  Sig  Dispense  Refill  . clopidogrel (PLAVIX) 75 MG tablet   Oral   Take 1 tablet (75 mg total) by mouth daily.   90 tablet   3   . amoxicillin (AMOXIL) 500 MG capsule      One hour prior to dental appt   4 capsule   0   . atorvastatin (LIPITOR) 20 MG tablet   Oral   Take 20 mg by mouth daily.         Marland Kitchen docusate sodium (COLACE) 100 MG capsule   Oral   Take 100 mg by mouth 2 (two) times daily as needed.          Marland Kitchen escitalopram (LEXAPRO) 10 MG tablet   Oral  Take 1 tablet (10 mg total) by mouth daily.   30 tablet   1   . nitroGLYCERIN (NITROLINGUAL) 0.4 MG/SPRAY spray   Sublingual   Place 1 spray under the tongue every 5 (five) minutes as needed.   12 g   8   . PredniSONE 10 MG KIT      Follow directions for 12 day steroid pack   1 kit   0   . ramipril (ALTACE) 2.5 MG capsule   Oral   Take 1 capsule (2.5 mg total) by mouth daily.   90 capsule   3    BP 151/69  Pulse 66  Resp 20  SpO2 94% Physical Exam  Nursing note and vitals reviewed. Constitutional: She is oriented to person, place, and time. She appears well-developed and well-nourished.  Slightly anxious   HENT:  Head: Normocephalic.  Mouth/Throat: Oropharynx is clear and moist.  Eyes: Conjunctivae are normal. Pupils are equal, round, and reactive to light.  Neck: Normal range of motion. Neck supple.  Cardiovascular: Normal rate,  regular rhythm and normal heart sounds.   Pulmonary/Chest: Effort normal and breath sounds normal. No respiratory distress. She has no wheezes. She has no rales.  Abdominal: Soft. Bowel sounds are normal. She exhibits no distension. There is no tenderness. There is no rebound and no guarding.  Musculoskeletal: Normal range of motion. She exhibits no edema and no tenderness.  Neurological: She is alert and oriented to person, place, and time.  Nl strength and sensation throughout. 2+ pulses. No pronator drift. NL finger to nose. +rhomberg (chronic), slightly unsteady with ambulation (chronic)   Skin: Skin is warm and dry.  Psychiatric: She has a normal mood and affect. Her behavior is normal. Judgment and thought content normal.    ED Course  Procedures (including critical care time) Labs Reviewed  CBC - Abnormal; Notable for the following:    Platelets 149 (*)    All other components within normal limits  BASIC METABOLIC PANEL - Abnormal; Notable for the following:    Glucose, Bld 135 (*)    GFR calc non Af Amer 80 (*)    All other components within normal limits  APTT - Abnormal; Notable for the following:    aPTT 23 (*)    All other components within normal limits  PROTIME-INR  POCT I-STAT TROPONIN I   Dg Chest 2 View  10/04/2012   *RADIOLOGY REPORT*  Clinical Data: Dizziness for 2 months with irregular heart rate and chest pain.  CHEST - 2 VIEW  Comparison: 07/15/2012.  Findings: The heart size and mediastinal contours are stable status post median sternotomy.  Coronary artery stents are noted.  The lungs are clear.  There is no pleural effusion or pneumothorax. Mild chronic blunting of both costophrenic angles is stable. Osseous structures are unchanged.  IMPRESSION: Stable postoperative chest.  No acute cardiopulmonary process.   Original Report Authenticated By: Carey Bullocks, M.D.   No diagnosis found.   Date: 10/04/2012  Rate: 66  Rhythm: normal sinus rhythm  QRS Axis:  left  Intervals: normal  ST/T Wave abnormalities: nonspecific ST changes  Conduction Disutrbances:left bundle branch block  Narrative Interpretation:   Old EKG Reviewed: unchanged    MDM  [name] [name] is a 79 y.o. female here with dizziness. She is orthostatic on arrival and resolved with NS 1L. Labs at baseline. EKG unremarkable and no events on the monitor. Her neuro exam is at baseline and she has good f/u outpatient with cardiology  and neurology. I suggest that she may benefit from midodrine but I will have her f/u with her doctors first for further workup.    Richardean Canal, MD 10/04/12 1247

## 2012-10-04 NOTE — ED Notes (Signed)
Daughter [name] [name]'s Phone Number: [phone]; please call with updates.

## 2012-10-06 ENCOUNTER — Encounter (HOSPITAL_COMMUNITY): Payer: Medicare Other

## 2012-10-09 ENCOUNTER — Encounter: Payer: Self-pay | Admitting: *Deleted

## 2012-10-09 ENCOUNTER — Encounter (HOSPITAL_COMMUNITY): Payer: Medicare Other

## 2012-10-10 ENCOUNTER — Encounter: Payer: Self-pay | Admitting: Internal Medicine

## 2012-10-10 ENCOUNTER — Ambulatory Visit (INDEPENDENT_AMBULATORY_CARE_PROVIDER_SITE_OTHER): Payer: Medicare Other | Admitting: Internal Medicine

## 2012-10-10 VITALS — BP 122/76 | HR 78 | Ht 68.0 in | Wt 142.0 lb

## 2012-10-10 DIAGNOSIS — I5042 Chronic combined systolic (congestive) and diastolic (congestive) heart failure: Secondary | ICD-10-CM | POA: Insufficient documentation

## 2012-10-10 DIAGNOSIS — R55 Syncope and collapse: Secondary | ICD-10-CM

## 2012-10-10 DIAGNOSIS — I951 Orthostatic hypotension: Secondary | ICD-10-CM

## 2012-10-10 DIAGNOSIS — I2510 Atherosclerotic heart disease of native coronary artery without angina pectoris: Secondary | ICD-10-CM

## 2012-10-10 DIAGNOSIS — I447 Left bundle-branch block, unspecified: Secondary | ICD-10-CM

## 2012-10-10 DIAGNOSIS — I5022 Chronic systolic (congestive) heart failure: Secondary | ICD-10-CM

## 2012-10-10 MED ORDER — MIDODRINE HCL 2.5 MG PO TABS: 2.5000 mg | ORAL_TABLET | Freq: Three times a day (TID) | ORAL | Status: DC

## 2012-10-10 NOTE — Assessment & Plan Note (Signed)
As above The event that she would require ICD   she could be considered for CRT

## 2012-10-10 NOTE — Progress Notes (Signed)
ELECTROPHYSIOLOGY CONSULT NOTE  Patient ID: [name] [name], MRN: [id number], DOB/AGE: [birth year] 77 y.o. Admit date: (Not on file) Date of Consult: 10/10/2012  Primary Physician: Judie Petit, MD Primary Cardiologist:    Chief Complaint: syncope   HPI [name] [name] is a 79 y.o. female  Seen because of recurrent and persistent orthostatic intolerance.  This began abruptly in April. she was having a normal morning when she awakened and had breakfast got up from the table and collapsed to the floor with some slow warning. She had residual orthostatic intolerance for some hours. The next couple of days were better but she had more symptoms and ended up in the emergency room. Orthostatic hypotension has been documented. Severity is not easily trackable.  She continues with orthostatic intolerance. She has no dizziness lying flat or sitting. She's been diagnosed as having trouble ataxia which improved initially with steroids and then has not improved any further. Her symptoms are alleviated by sitting down.  She has a history of ischemic heart disease with prior MI and  bypass surgery prior PCI to the LAD and previously normal left ventricular function. However, on evaluation 4/14 echocardiogram demonstrated EF 25-30%. LHC 4/14 Proximal LAD stent patent, LIMA-LAD atretic, D1 occluded, proximal circumflex 30%, mid RCA occluded, SVG-D1 normal, SVG-OM2 normal, SVG-distal RCA normal, EF 40% with diffuse HK. MRI has been ordered to help assess left ventricular function but these results are pending  Outpatient monitoring demonstrated 2 episodes of SVT at 150.      Past Medical History  Diagnosis Date  . MELANOMA 09/06/2008  . HYPERLIPIDEMIA 10/06/2007  . HYPERTENSION 10/06/2007  . MYOCARDIAL INFARCTION, HX OF 10/06/2007  . CORONARY ARTERY DISEASE 10/06/2007    a. s/p PCI to LAD; b. s/p CABG; c. LHC 07/13/12: Proximal LAD stent patent, LIMA-LAD atretic, D1 occluded, proximal circumflex 30%, mid RCA  occluded, SVG-D1 normal, SVG-OM2 normal, SVG-distal RCA normal, EF 40% with diffuse HK  . LBBB 09/06/2008  . Atrial fibrillation 10/06/2007    post op  . VARICOSE VEIN 09/29/2009  . UTERINE CANCER, HX OF 09/06/2008  . PERSONAL HX COLONIC POLYPS 10/30/2009  . Syncope   . Dizziness   . NICM (nonischemic cardiomyopathy)     Echocardiogram 07/12/12: EF 25-30%, diffuse HK, mild AI, mild MR, mild LAE      Surgical History:  Past Surgical History  Procedure Laterality Date  . Oophorectomy    . Right distal pretibeal      melanoma  . Coronary angioplasty with stent placement  2009  . Coronary artery bypass graft  2004  . Abdominal hysterectomy  1988  . Cardiac catheterization  08/30/2008     Home Meds: Prior to Admission medications   Medication Sig Start Date End Date Taking? Authorizing Provider  amoxicillin (AMOXIL) 500 MG capsule One hour prior to dental appt 09/19/12  Yes Bruce Romilda Garret, MD  atorvastatin (LIPITOR) 20 MG tablet Take 20 mg by mouth daily.   Yes Historical Provider, MD  clopidogrel (PLAVIX) 75 MG tablet Take 1 tablet (75 mg total) by mouth daily. 11/16/11  Yes Duke Salvia, MD  docusate sodium (COLACE) 100 MG capsule Take 100 mg by mouth 2 (two) times daily as needed.    Yes Historical Provider, MD  nitroGLYCERIN (NITROLINGUAL) 0.4 MG/SPRAY spray Place 1 spray under the tongue every 5 (five) minutes as needed. 12/23/10  Yes Gaylord Shih, MD  ramipril (ALTACE) 2.5 MG capsule Take 1 capsule (2.5 mg total) by mouth daily. 09/15/12  Yes Beatrice Lecher, PA-C     Allergies:  Allergies  Allergen Reactions  . Nitroglycerin Other (See Comments)    sensitive to tabs which causes rapid drop in blood pressure. Is ok to use oral spray form    History   Social History  . Marital Status: Widowed    Spouse Name: N/A    Number of Children: N/A  . Years of Education: N/A   Occupational History  . Not on file.   Social History Main Topics  . Smoking status: Never Smoker   .  Smokeless tobacco: Never Used  . Alcohol Use: Yes     Comment: daily gin or wine  . Drug Use: No  . Sexually Active: Not on file   Other Topics Concern  . Not on file   Social History Narrative  . No narrative on file     Family History  Problem Relation Age of Onset  . Stroke Mother   . Heart attack Father   . Colon cancer Neg Hx      ROS:  Please see the history of present illness.     All other systems reviewed and negative.    Physical Exam:   Blood pressure 122/76, pulse 78, height 5\' 8"  (1.727 m), weight 142 lb (64.411 kg). General: Well developed, well nourished female in no acute distress. Head: Normocephalic, atraumatic, sclera non-icteric, no xanthomas, nares are without discharge. EENT: normal Lymph Nodes:  none Back: without scoliosis/kyphosis , no CVA tendersness Neck: Negative for carotid bruits. JVD not elevated. Lungs: Clear bilaterally to auscultation without wheezes, rales, or rhonchi. Breathing is unlabored. Heart: RRR with S1 S2. No murmur , rubs, or gallops appreciated. Abdomen: Soft, non-tender, non-distended with normoactive bowel sounds. No hepatomegaly. No rebound/guarding. No obvious abdominal masses. Msk:  Strength and tone appear normal for age. Extremities: No clubbing or cyanosis. No edema.  Distal pedal pulses are 2+ and equal bilaterally. Skin: Warm and Dry Neuro: Alert and oriented X 3. CN III-XII intact Grossly normal sensory and motor function . Psych:  Responds to questions appropriately with a normal affect.      Labs: Cardiac Enzymes No results found for this basename: CKTOTAL, CKMB, TROPONINI,  in the last 72 hours CBC Lab Results  Component Value Date   WBC 8.8 10/04/2012   HGB 14.0 10/04/2012   HCT 40.8 10/04/2012   MCV 85.4 10/04/2012   PLT 149* 10/04/2012   PROTIME: No results found for this basename: LABPROT, INR,  in the last 72 hours Chemistry  Recent Labs Lab 10/04/12 0951  NA 137  K 3.8  CL 98  CO2 28  BUN 18    CREATININE 0.71  CALCIUM 8.9  GLUCOSE 135*   Lipids Lab Results  Component Value Date   CHOL 145 07/12/2012   HDL 52 07/12/2012   LDLCALC 78 07/12/2012   TRIG 76 07/12/2012   BNP No results found for this basename: probnp   Miscellaneous No results found for this basename: DDIMER    Radiology/Studies:  Dg Chest 2 View  10/04/2012   *RADIOLOGY REPORT*  Clinical Data: Dizziness for 2 months with irregular heart rate and chest pain.  CHEST - 2 VIEW  Comparison: 07/15/2012.  Findings: The heart size and mediastinal contours are stable status post median sternotomy.  Coronary artery stents are noted.  The lungs are clear.  There is no pleural effusion or pneumothorax. Mild chronic blunting of both costophrenic angles is stable. Osseous structures are unchanged.  IMPRESSION: Stable postoperative chest.  No acute cardiopulmonary process.   Original Report Authenticated By: Carey Bullocks, M.D.    EKG:  ECG demonstrates sinus rhythm with left axis deviation and left bundle branch block  Assessment and Plan:    Sherryl Manges

## 2012-10-10 NOTE — Assessment & Plan Note (Addendum)
Ischemic cardiomyopathy with an ejection fraction that has been variably defined between 25 and 40%. She was to get an MRI to clarify the ejection fraction; that has not yet been done. She has left bundle branch block.

## 2012-10-10 NOTE — Patient Instructions (Signed)
Your physician has recommended you make the following change in your medication:  1) Start proamitine (midodrine) 2.5 mg one tablet by mouth three times a day ( usually 7 am, 11am, & 3pm- or at least 4 hours apart while awake).  You have been given an order for support stockings to be worn.

## 2012-10-10 NOTE — Assessment & Plan Note (Signed)
HER EPISODES OF SYNCOPE OCCUR CONTEXTUALLY IN THE CONTEXT OF ORTHOSTATIC STRESS: i THINK IS UNLIKELY THAT THEY ARE ARRHYTHMIC.  Still however, it becomes important the in the context of her cardiomyopathy to consider risk stratification for a rhythmic death and this will arrive for assessment of left ventricular function as noted above

## 2012-10-10 NOTE — Assessment & Plan Note (Signed)
Stable and euvolemic 

## 2012-10-11 ENCOUNTER — Encounter (HOSPITAL_COMMUNITY): Payer: Medicare Other

## 2012-10-11 ENCOUNTER — Telehealth: Payer: Self-pay | Admitting: Internal Medicine

## 2012-10-11 DIAGNOSIS — I5022 Chronic systolic (congestive) heart failure: Secondary | ICD-10-CM

## 2012-10-11 NOTE — Telephone Encounter (Signed)
Per dr Graciela Husbands, he would like the pt to have a muga test to evaluate her EF%. Left message for pt to call to schedule.

## 2012-10-11 NOTE — Telephone Encounter (Signed)
Follow-up:    Patient called in returning you call.  Please call back.

## 2012-10-11 NOTE — Telephone Encounter (Signed)
Spoke with pt, MUGA scheduled.

## 2012-10-11 NOTE — Telephone Encounter (Signed)
New Problem  Pt states she received a call from Dr Graciela Husbands last night and she missed his call. She asked if you could give her a call back.

## 2012-10-11 NOTE — Telephone Encounter (Signed)
Spoke with pt, aware dr Graciela Husbands wanted the pt to have a cardiac MRI, also the referral to the autonomic neurologist will not be done until next week because the schedular is out. Pt reports the MRI was scheduled last week but had to be cx because insurance would not approve. Dr Graciela Husbands made aware.

## 2012-10-16 ENCOUNTER — Encounter (HOSPITAL_COMMUNITY): Payer: Medicare Other

## 2012-10-17 ENCOUNTER — Ambulatory Visit (HOSPITAL_COMMUNITY): Payer: Medicare Other | Attending: Cardiology | Admitting: Radiology

## 2012-10-17 DIAGNOSIS — I10 Essential (primary) hypertension: Secondary | ICD-10-CM | POA: Insufficient documentation

## 2012-10-17 DIAGNOSIS — I447 Left bundle-branch block, unspecified: Secondary | ICD-10-CM | POA: Insufficient documentation

## 2012-10-17 DIAGNOSIS — I252 Old myocardial infarction: Secondary | ICD-10-CM | POA: Insufficient documentation

## 2012-10-17 DIAGNOSIS — I4891 Unspecified atrial fibrillation: Secondary | ICD-10-CM

## 2012-10-17 DIAGNOSIS — I5022 Chronic systolic (congestive) heart failure: Secondary | ICD-10-CM

## 2012-10-17 DIAGNOSIS — Z951 Presence of aortocoronary bypass graft: Secondary | ICD-10-CM | POA: Insufficient documentation

## 2012-10-17 DIAGNOSIS — E785 Hyperlipidemia, unspecified: Secondary | ICD-10-CM | POA: Insufficient documentation

## 2012-10-17 DIAGNOSIS — I255 Ischemic cardiomyopathy: Secondary | ICD-10-CM

## 2012-10-17 DIAGNOSIS — G259 Extrapyramidal and movement disorder, unspecified: Secondary | ICD-10-CM | POA: Insufficient documentation

## 2012-10-17 DIAGNOSIS — R55 Syncope and collapse: Secondary | ICD-10-CM | POA: Insufficient documentation

## 2012-10-17 DIAGNOSIS — I2589 Other forms of chronic ischemic heart disease: Secondary | ICD-10-CM

## 2012-10-17 DIAGNOSIS — I498 Other specified cardiac arrhythmias: Secondary | ICD-10-CM | POA: Insufficient documentation

## 2012-10-17 MED ORDER — TECHNETIUM TC 99M-LABELED RED BLOOD CELLS IV KIT
30.0000 | PACK | Freq: Once | INTRAVENOUS | Status: AC | PRN
  Administered 2012-10-17: 30 via INTRAVENOUS

## 2012-10-17 NOTE — Progress Notes (Signed)
Muga Study  Referring Provider:  Duke Salvia, MD  Date of Procedure: 10/17/2012  Indication: Syncope, Orthostatic Intolerance 07-2012 Echo: EF=25-30%, ICM 07-2012 Cath: Stent Patent, EF=40% '04 CABG; '09 MI; '09 Stent (LAD); and '10 LBBB, History SVT, HTN, Lipids  IV 20G Angiocath (R) AC x 1, site unremarkable, tolerated well. Irean Hong, RN  Muga Information:  The patient's red blood cells were labeled using the Ultra Tag method with 33.0 mci of Technetium 39m Pertechnetate.  The images were reconstructed in the Anterior, Lateral and Left Anterior oblique views.  Impression: Septal akinesis; EF 44; ESV 62 ml; EDV 112 ml.   Olga Millers

## 2012-10-18 ENCOUNTER — Encounter (HOSPITAL_COMMUNITY): Payer: Medicare Other

## 2012-10-18 NOTE — Telephone Encounter (Signed)
New problem   Please call pt. She has a question about the message you sent her.

## 2012-10-19 ENCOUNTER — Telehealth: Payer: Self-pay | Admitting: *Deleted

## 2012-10-19 ENCOUNTER — Encounter: Payer: Self-pay | Admitting: Cardiology

## 2012-10-19 ENCOUNTER — Ambulatory Visit (INDEPENDENT_AMBULATORY_CARE_PROVIDER_SITE_OTHER): Payer: Medicare Other | Admitting: Cardiology

## 2012-10-19 VITALS — BP 110/62 | HR 85 | Ht 68.0 in | Wt 142.0 lb

## 2012-10-19 DIAGNOSIS — I951 Orthostatic hypotension: Secondary | ICD-10-CM | POA: Insufficient documentation

## 2012-10-19 NOTE — Telephone Encounter (Signed)
Dr Katharina Caper office requested I fax records. Last ov, demographics, ekg, and muga results faxed to [phone]. Pt made aware. -----   Message ----- From: Jefferey Pica, RN Sent: 10/10/2012 3:55 PM To: Deliah Goody, RN Next week I'm out until Thursday. Can you help me with something? Dr. Graciela Husbands saw this new patient today that he wants to see an autonomic specialist at [facility]. He spoke with a Dr. Belva Chimes who said he would see the patient. I need to call his secretary, Jasmine December, but she is out all week this week. Can you call her early next week at [phone] and give her the patient's information. Can you then let the patient know if they will be calling her with the appt. Thanks.

## 2012-10-19 NOTE — Patient Instructions (Addendum)
Medication change:   STOP:  Ramipril (Altace)  Your physician has requested that you regularly monitor and record your blood pressure readings at home. Your goal blood pressure is less than 140/90.   Please use the same machine at the same time of day to check your readings and record them to bring to your follow-up visit.  Remember to drink 6-8 glasses of water daily.   Follow-up with Dr. Graciela Husbands in 6-8 weeks

## 2012-10-19 NOTE — Progress Notes (Signed)
Mrs Daddario returns today for evaluation and management of orthostatic hypotension. There was also question of what her ejection fraction truly is. A MUGA scan was ordered and shows an EF of 44%. He was 40% by catheterization in April with stable coronary anatomy.  She was started on Midodrine per Dr. Graciela Husbands but it caused itching. She discontinued it. Her BUN/creatinine ratio was elevated. She has been making an attempt to stay better hydrated per her daughter [name].  She's been having much less dizziness. She is now metoprolol. She still on low-dose ramipril.  I discussed the situation with her daughter and her as well as Dr. Graciela Husbands. I've updated them on all the results. I discontinued ramipril. We will obtain for blood pressure of around 135/85 or less. She will check her blood pressure on regular basis. She will continue to stay well hydrated. I'll schedule followup with Dr. Graciela Husbands in about 6-8 weeks.

## 2012-10-19 NOTE — Telephone Encounter (Signed)
Pt was seen by dr wall today, she will call me back if she has a question for me regarding referral to other md.

## 2012-10-20 ENCOUNTER — Encounter (HOSPITAL_COMMUNITY): Payer: Medicare Other

## 2012-10-23 ENCOUNTER — Encounter (HOSPITAL_COMMUNITY): Payer: Medicare Other

## 2012-10-25 ENCOUNTER — Encounter (HOSPITAL_COMMUNITY): Payer: Medicare Other

## 2012-10-27 ENCOUNTER — Encounter (HOSPITAL_COMMUNITY): Payer: Medicare Other

## 2012-10-30 ENCOUNTER — Encounter (HOSPITAL_COMMUNITY): Payer: Medicare Other

## 2012-11-01 ENCOUNTER — Encounter (HOSPITAL_COMMUNITY): Payer: Medicare Other

## 2012-11-03 ENCOUNTER — Encounter (HOSPITAL_COMMUNITY): Payer: Medicare Other

## 2012-11-06 ENCOUNTER — Encounter (HOSPITAL_COMMUNITY): Payer: Medicare Other

## 2012-11-08 ENCOUNTER — Encounter (HOSPITAL_COMMUNITY): Payer: Medicare Other

## 2012-11-10 ENCOUNTER — Encounter (HOSPITAL_COMMUNITY): Payer: Medicare Other

## 2012-11-13 ENCOUNTER — Encounter (HOSPITAL_COMMUNITY): Payer: Medicare Other

## 2012-11-15 ENCOUNTER — Encounter (HOSPITAL_COMMUNITY): Payer: Medicare Other

## 2012-11-17 ENCOUNTER — Encounter (HOSPITAL_COMMUNITY): Payer: Medicare Other

## 2012-11-20 ENCOUNTER — Encounter (HOSPITAL_COMMUNITY): Payer: Medicare Other

## 2012-11-22 ENCOUNTER — Encounter (HOSPITAL_COMMUNITY): Payer: Medicare Other

## 2012-11-24 ENCOUNTER — Encounter (HOSPITAL_COMMUNITY): Payer: Medicare Other

## 2012-11-27 ENCOUNTER — Encounter (HOSPITAL_COMMUNITY): Payer: Medicare Other

## 2012-11-29 ENCOUNTER — Encounter (HOSPITAL_COMMUNITY): Payer: Medicare Other

## 2012-12-01 ENCOUNTER — Encounter (HOSPITAL_COMMUNITY): Payer: Medicare Other

## 2012-12-04 ENCOUNTER — Encounter (HOSPITAL_COMMUNITY): Payer: Medicare Other

## 2012-12-05 ENCOUNTER — Ambulatory Visit (INDEPENDENT_AMBULATORY_CARE_PROVIDER_SITE_OTHER): Payer: Medicare Other | Admitting: Internal Medicine

## 2012-12-05 ENCOUNTER — Encounter: Payer: Self-pay | Admitting: Internal Medicine

## 2012-12-05 VITALS — BP 151/74 | HR 88 | Ht 68.0 in | Wt 147.6 lb

## 2012-12-05 DIAGNOSIS — I951 Orthostatic hypotension: Secondary | ICD-10-CM

## 2012-12-05 DIAGNOSIS — I4891 Unspecified atrial fibrillation: Secondary | ICD-10-CM

## 2012-12-05 DIAGNOSIS — I255 Ischemic cardiomyopathy: Secondary | ICD-10-CM

## 2012-12-05 DIAGNOSIS — I2589 Other forms of chronic ischemic heart disease: Secondary | ICD-10-CM

## 2012-12-05 DIAGNOSIS — F411 Generalized anxiety disorder: Secondary | ICD-10-CM

## 2012-12-05 MED ORDER — MIDODRINE HCL 5 MG PO TABS: 5.0000 mg | ORAL_TABLET | Freq: Three times a day (TID) | ORAL | Status: DC

## 2012-12-05 NOTE — Progress Notes (Signed)
Lightheaded/dizzy and feels unsteady.

## 2012-12-05 NOTE — Progress Notes (Signed)
Lightheaded/dizzy, has to readjust her balance.

## 2012-12-05 NOTE — Progress Notes (Signed)
Patient Care Team: Lindley Magnus, MD as PCP - General   HPI  [name] [name] is a 79 y.o. female Seen in followup for syncope which contextually orthostatic. She does however have a history of  ischemic heart disease with prior MI and bypass surgery prior PCI to the LAD and previously normal left ventricular function. However, on evaluation 4/14 echocardiogram demonstrated EF 25-30%. LHC 4/14 Proximal LAD stent patent, LIMA-LAD atretic, D1 occluded, proximal circumflex 30%, mid RCA occluded, SVG-D1 normal, SVG-OM2 normal, SVG-distal RCA normal, EF 40% with diffuse HK. MRI has been ordered to help assess left ventricular function ; this was never completed secondary to approval issues. MUGA scanning demonstrated ejection fraction of 44% She is to see. the autonomic neurologist at [facility]  She is somewhat better since we decreased some of her antihypertensives. sHe continues however to feel quite unstable. She is working on staying hydrated. She has been number of adjustments of body. Her symptoms are worse in the morning.  She is tearful as she recounts a loss of her husband and her dog. There have been issues concerning depression; she has not been willing to continue her antidepressants  She has a history of atrial fibrillation that persisted for some years after her bypass surgery. It  has been asymptomatic since then and her Coumadin was discontinued .  Per chart as TIA listed in the problem list; according to her daughter this was a presumptive diagnosis and was never confirmed. MRI was negative  Past Medical History  Diagnosis Date  . MELANOMA 09/06/2008  . HYPERLIPIDEMIA 10/06/2007  . HYPERTENSION 10/06/2007  . MYOCARDIAL INFARCTION, HX OF 10/06/2007  . CORONARY ARTERY DISEASE 10/06/2007    a. s/p PCI to LAD; b. s/p CABG; c. LHC 07/13/12: Proximal LAD stent patent, LIMA-LAD atretic, D1 occluded, proximal circumflex 30%, mid RCA occluded, SVG-D1 normal, SVG-OM2 normal, SVG-distal RCA normal,  EF 40% with diffuse HK  . LBBB 09/06/2008  . Atrial fibrillation 10/06/2007    post op  . VARICOSE VEIN 09/29/2009  . UTERINE CANCER, HX OF 09/06/2008  . PERSONAL HX COLONIC POLYPS 10/30/2009  . Syncope   . Dizziness   . NICM (nonischemic cardiomyopathy)     Echocardiogram 07/12/12: EF 25-30%, diffuse HK, mild AI, mild MR, mild LAE    Past Surgical History  Procedure Laterality Date  . Oophorectomy    . Right distal pretibeal      melanoma  . Coronary angioplasty with stent placement  2009  . Coronary artery bypass graft  2004  . Abdominal hysterectomy  1988  . Cardiac catheterization  08/30/2008    Current Outpatient Prescriptions  Medication Sig Dispense Refill  . amoxicillin (AMOXIL) 500 MG capsule as needed. One hour prior to dental appt      . atorvastatin (LIPITOR) 20 MG tablet Take 20 mg by mouth daily.      . clopidogrel (PLAVIX) 75 MG tablet Take 1 tablet (75 mg total) by mouth daily.  90 tablet  3  . nitroGLYCERIN (NITROLINGUAL) 0.4 MG/SPRAY spray Place 1 spray under the tongue every 5 (five) minutes as needed.  12 g  8   No current facility-administered medications for this visit.    Allergies  Allergen Reactions  . Nitroglycerin Other (See Comments)    sensitive to tabs which causes rapid drop in blood pressure. Is ok to use oral spray form    Review of Systems negative except from HPI and PMH  Physical Exam BP 143/77  Pulse 85  Ht 5\' 8"  (1.727 m)  Wt 147 lb 9.6 oz (66.951 kg)  BMI 22.45 kg/m2 Well developed and well nourished in no acute distress HENT normal E scleral and icterus clear Neck Supple JVP flat; carotids brisk and full Clear to ausculation  Regular rate and rhythm, no murmurs gallops or rub Soft with active bowel sounds No clubbing cyanosis none Edema Alert and oriented, grossly normal motor and sensory function Skin Warm and Dry Affect tears    Assessment and  Plan

## 2012-12-05 NOTE — Assessment & Plan Note (Signed)
The patient has a history of atrial fibrillation. Her thromboembolic risk profile would prompt the use of anticoagulants. She has not had symptoms. We'll need to continue to follow this closely. I have a low threshold for initiation of anticoagulation. There is a question of a TIA in the past; was never confirmed by MRI scanning

## 2012-12-05 NOTE — Patient Instructions (Addendum)
Your physician has recommended you make the following change in your medication:  1) Start Proamatine 5mg  one tablet three times a day)  Your physician recommends that you schedule a follow-up appointment in: 8 weeks with Dr. Graciela Husbands

## 2012-12-05 NOTE — Progress Notes (Signed)
Slightly dizzy

## 2012-12-05 NOTE — Assessment & Plan Note (Signed)
We had a lengthy discussion regarding this. It is her daughter's impression that this is real. There is some question as to whether  simply secondary to the isolation that has resulted since she had syncope in April.my impression is that antedates this.

## 2012-12-05 NOTE — Assessment & Plan Note (Signed)
Ejection fraction was 44%. This is very encouraging relative to risk stratification for sudden cardiac death. I do think she would benefit from an ACE inhibitor. We will wait until she meets with the autonomic neurologist at [facility]. If we were to start I would start at night

## 2012-12-05 NOTE — Assessment & Plan Note (Signed)
She is not hypotensive today on orthostatic maneuvers; however, there is a modest increase in heart rate. Still her symptoms are quite consistent with this. She is to see   autonomic neurologist at [facility].

## 2012-12-05 NOTE — Progress Notes (Signed)
lightheaded

## 2012-12-06 ENCOUNTER — Encounter (HOSPITAL_COMMUNITY): Payer: Medicare Other

## 2012-12-06 ENCOUNTER — Other Ambulatory Visit: Payer: Self-pay | Admitting: Family

## 2012-12-08 ENCOUNTER — Encounter (HOSPITAL_COMMUNITY): Payer: Medicare Other

## 2012-12-13 ENCOUNTER — Encounter (HOSPITAL_COMMUNITY): Payer: Medicare Other

## 2012-12-15 ENCOUNTER — Telehealth: Payer: Self-pay | Admitting: Internal Medicine

## 2012-12-15 ENCOUNTER — Encounter (HOSPITAL_COMMUNITY): Payer: Medicare Other

## 2012-12-15 NOTE — Telephone Encounter (Signed)
New problem    Need some advise on tilting bed by request from Dr. Graciela Husbands.

## 2012-12-15 NOTE — Telephone Encounter (Signed)
Patient and Dr. Graciela Husbands talked about ways to help reduce orthostatic issues. One way was tilting head of bead to higher position.  Patient complains that after tilting the head of bead upwards 4 inches it now  feels like all the blood is going to her lower extremities and causing them to ache. I advised her to tilt the head of bead down an inch and to put a pillow/two under her feet, give that a try and if it was still problematic to call back for further instructions.  Patient is agreeable to plan.

## 2012-12-18 ENCOUNTER — Encounter (HOSPITAL_COMMUNITY): Payer: Medicare Other

## 2012-12-20 ENCOUNTER — Encounter (HOSPITAL_COMMUNITY): Payer: Medicare Other

## 2012-12-22 ENCOUNTER — Encounter (HOSPITAL_COMMUNITY): Payer: Medicare Other

## 2012-12-25 ENCOUNTER — Encounter (HOSPITAL_COMMUNITY): Payer: Medicare Other

## 2012-12-27 ENCOUNTER — Encounter (HOSPITAL_COMMUNITY): Payer: Medicare Other

## 2012-12-29 ENCOUNTER — Encounter (HOSPITAL_COMMUNITY): Payer: Medicare Other

## 2013-01-01 ENCOUNTER — Encounter (HOSPITAL_COMMUNITY): Payer: Medicare Other

## 2013-01-02 ENCOUNTER — Encounter: Payer: Self-pay | Admitting: Internal Medicine

## 2013-01-02 ENCOUNTER — Ambulatory Visit (INDEPENDENT_AMBULATORY_CARE_PROVIDER_SITE_OTHER): Payer: Medicare Other | Admitting: Internal Medicine

## 2013-01-02 VITALS — BP 150/70 | HR 89 | Temp 97.8°F | Wt 149.0 lb

## 2013-01-02 DIAGNOSIS — I951 Orthostatic hypotension: Secondary | ICD-10-CM

## 2013-01-02 DIAGNOSIS — I255 Ischemic cardiomyopathy: Secondary | ICD-10-CM

## 2013-01-02 DIAGNOSIS — R55 Syncope and collapse: Secondary | ICD-10-CM

## 2013-01-02 DIAGNOSIS — F411 Generalized anxiety disorder: Secondary | ICD-10-CM

## 2013-01-02 DIAGNOSIS — I2589 Other forms of chronic ischemic heart disease: Secondary | ICD-10-CM

## 2013-01-02 DIAGNOSIS — I4891 Unspecified atrial fibrillation: Secondary | ICD-10-CM

## 2013-01-02 NOTE — Progress Notes (Signed)
Subjective:    Patient ID: [name] [name], female    DOB: March 05, 1932, 79 y.o.   MRN: [id number]  HPI  79 year old patient who has a history of syncope and was hospitalized on March 31 of this year. This was felt secondary to orthostatic hypotension in one month ago was placed on midodrine.  She is followed by cardiology for ischemic cardiomyopathy and atrial fibrillation. She has a history of anxiety. She has been using support hose She does not feel that there has been any change in her clinical status since starting midodrine. She was stable until 5 AM today when she was awoken with a significant left frontal headache she went back to sleep and awoke feeling much improved. Headache throughout the day has essentially resolved. She denies any focal neurological symptoms. She was quite concerned about the new onset of headaches. She states her husband died of a hemorrhagic stroke  Past Medical History  Diagnosis Date  . MELANOMA 09/06/2008  . HYPERLIPIDEMIA 10/06/2007  . HYPERTENSION 10/06/2007  . MYOCARDIAL INFARCTION, HX OF 10/06/2007  . CORONARY ARTERY DISEASE 10/06/2007    a. s/p PCI to LAD; b. s/p CABG; c. LHC 07/13/12: Proximal LAD stent patent, LIMA-LAD atretic, D1 occluded, proximal circumflex 30%, mid RCA occluded, SVG-D1 normal, SVG-OM2 normal, SVG-distal RCA normal, EF 40% with diffuse HK  . LBBB 09/06/2008  . Atrial fibrillation 10/06/2007    post op  . VARICOSE VEIN 09/29/2009  . UTERINE CANCER, HX OF 09/06/2008  . PERSONAL HX COLONIC POLYPS 10/30/2009  . Syncope   . Dizziness   . NICM (nonischemic cardiomyopathy)     Echocardiogram 07/12/12: EF 25-30%, diffuse HK, mild AI, mild MR, mild LAE    History   Social History  . Marital Status: Widowed    Spouse Name: N/A    Number of Children: N/A  . Years of Education: N/A   Occupational History  . Not on file.   Social History Main Topics  . Smoking status: Never Smoker   . Smokeless tobacco: Never Used  . Alcohol Use: Yes   Comment: daily gin or wine  . Drug Use: No  . Sexual Activity: Not on file   Other Topics Concern  . Not on file   Social History Narrative  . No narrative on file    Past Surgical History  Procedure Laterality Date  . Oophorectomy    . Right distal pretibeal      melanoma  . Coronary angioplasty with stent placement  2009  . Coronary artery bypass graft  2004  . Abdominal hysterectomy  1988  . Cardiac catheterization  08/30/2008    Family History  Problem Relation Age of Onset  . Stroke Mother   . Heart attack Father   . Colon cancer Neg Hx     Allergies  Allergen Reactions  . Nitroglycerin Other (See Comments)    sensitive to tabs which causes rapid drop in blood pressure. Is ok to use oral spray form    Current Outpatient Prescriptions on File Prior to Visit  Medication Sig Dispense Refill  . atorvastatin (LIPITOR) 20 MG tablet Take 20 mg by mouth daily.      . clopidogrel (PLAVIX) 75 MG tablet Take 1 tablet (75 mg total) by mouth daily.  90 tablet  3  . midodrine (PROAMATINE) 5 MG tablet Take 1 tablet (5 mg total) by mouth 3 (three) times daily.  90 tablet  3  . nitroGLYCERIN (NITROLINGUAL) 0.4 MG/SPRAY spray Place 1 spray under  the tongue every 5 (five) minutes as needed.  12 g  8  . amoxicillin (AMOXIL) 500 MG capsule as needed. One hour prior to dental appt      . escitalopram (LEXAPRO) 10 MG tablet TAKE 1 TABLET BY MOUTH DAILY  30 tablet  5   No current facility-administered medications on file prior to visit.    BP 150/70  Pulse 89  Temp(Src) 97.8 F (36.6 C) (Oral)  Wt 149 lb (67.586 kg)  BMI 22.66 kg/m2  SpO2 99%       Review of Systems  Constitutional: Negative.   HENT: Negative for hearing loss, congestion, sore throat, rhinorrhea, dental problem, sinus pressure and tinnitus.   Eyes: Negative for pain, discharge and visual disturbance.  Respiratory: Negative for cough and shortness of breath.   Cardiovascular: Negative for chest pain,  palpitations and leg swelling.  Gastrointestinal: Negative for nausea, vomiting, abdominal pain, diarrhea, constipation, blood in stool and abdominal distention.  Genitourinary: Negative for dysuria, urgency, frequency, hematuria, flank pain, vaginal bleeding, vaginal discharge, difficulty urinating, vaginal pain and pelvic pain.  Musculoskeletal: Negative for joint swelling, arthralgias and gait problem.  Skin: Negative for rash.  Neurological: Positive for light-headedness and headaches. Negative for dizziness, syncope, speech difficulty, weakness and numbness.  Hematological: Negative for adenopathy.  Psychiatric/Behavioral: Negative for behavioral problems, dysphoric mood and agitation. The patient is nervous/anxious.        Objective:   Physical Exam  Constitutional: She is oriented to person, place, and time. She appears well-developed and well-nourished.  Blood pressure 130/70 sitting and 130/70 standing (no midodrine today)  Anxious No distress   HENT:  Head: Normocephalic.  Right Ear: External ear normal.  Left Ear: External ear normal.  Mouth/Throat: Oropharynx is clear and moist.  Eyes: Conjunctivae and EOM are normal. Pupils are equal, round, and reactive to light.  Neck: Normal range of motion. Neck supple. No thyromegaly present.  Cardiovascular: Normal rate, regular rhythm, normal heart sounds and intact distal pulses.   Pulmonary/Chest: Effort normal and breath sounds normal.  Abdominal: Soft. Bowel sounds are normal. She exhibits no mass. There is no tenderness.  Musculoskeletal: Normal range of motion.  Lymphadenopathy:    She has no cervical adenopathy.  Neurological: She is alert and oriented to person, place, and time. She displays normal reflexes. No cranial nerve deficit. She exhibits normal muscle tone. Coordination normal.  Finger-nose testing normal No drift Heel-to-shin testing normal  Gait slightly unsteady but not ataxic  Skin: Skin is warm and dry.  No rash noted.  Psychiatric: She has a normal mood and affect. Her behavior is normal.          Assessment & Plan:   History left frontal headache. Now resolved. Normal neuro examination. Patient reassured History of orthostatic hypotension. Blood pressure normal today without orthostasis. Compliance with her medication encouraged. Followup autonomic neurologists in [facility] as scheduled History of ischemic cardiomyopathy

## 2013-01-02 NOTE — Patient Instructions (Signed)
Followup with Dr. Graciela Husbands and  the [facility] autonomic neurologists as scheduled  Call or return to clinic prn if these symptoms worsen or fail to improve as anticipated.

## 2013-01-03 ENCOUNTER — Encounter (HOSPITAL_COMMUNITY): Payer: Medicare Other

## 2013-01-04 ENCOUNTER — Other Ambulatory Visit: Payer: Self-pay | Admitting: Internal Medicine

## 2013-01-05 ENCOUNTER — Encounter (HOSPITAL_COMMUNITY): Payer: Medicare Other

## 2013-01-08 ENCOUNTER — Encounter (HOSPITAL_COMMUNITY): Payer: Medicare Other

## 2013-01-12 ENCOUNTER — Ambulatory Visit (INDEPENDENT_AMBULATORY_CARE_PROVIDER_SITE_OTHER): Payer: Medicare Other | Admitting: Internal Medicine

## 2013-01-12 ENCOUNTER — Encounter: Payer: Self-pay | Admitting: Internal Medicine

## 2013-01-12 VITALS — BP 136/70 | HR 82 | Temp 98.1°F | Wt 149.0 lb

## 2013-01-12 DIAGNOSIS — H019 Unspecified inflammation of eyelid: Secondary | ICD-10-CM

## 2013-01-12 DIAGNOSIS — Z23 Encounter for immunization: Secondary | ICD-10-CM

## 2013-01-12 MED ORDER — POLYMYXIN B-TRIMETHOPRIM 10000-0.1 UNIT/ML-% OP SOLN: 1.0000 [drp] | OPHTHALMIC | Status: DC

## 2013-01-12 NOTE — Progress Notes (Signed)
Chief Complaint  Patient presents with  . Itchy Eye  . Red and Swollen    HPI: Patient comes in today for SDA for  new problem evaluation. pcp NA  Onset this week with itchy right eye  And ongogin sore to touch and some swelling lower lid no change in her vision. No acute allergies has used over-the-counter anti-allergy eyedrops in the past. Put on old pcn topical   For similar sx. may have helped some. No URI fever cough otherwise congestion. ROS: See pertinent positives and negatives per HPI.  Past Medical History  Diagnosis Date  . MELANOMA 09/06/2008  . HYPERLIPIDEMIA 10/06/2007  . HYPERTENSION 10/06/2007  . MYOCARDIAL INFARCTION, HX OF 10/06/2007  . CORONARY ARTERY DISEASE 10/06/2007    a. s/p PCI to LAD; b. s/p CABG; c. LHC 07/13/12: Proximal LAD stent patent, LIMA-LAD atretic, D1 occluded, proximal circumflex 30%, mid RCA occluded, SVG-D1 normal, SVG-OM2 normal, SVG-distal RCA normal, EF 40% with diffuse HK  . LBBB 09/06/2008  . Atrial fibrillation 10/06/2007    post op  . VARICOSE VEIN 09/29/2009  . UTERINE CANCER, HX OF 09/06/2008  . PERSONAL HX COLONIC POLYPS 10/30/2009  . Syncope   . Dizziness   . NICM (nonischemic cardiomyopathy)     Echocardiogram 07/12/12: EF 25-30%, diffuse HK, mild AI, mild MR, mild LAE    Family History  Problem Relation Age of Onset  . Stroke Mother   . Heart attack Father   . Colon cancer Neg Hx     History   Social History  . Marital Status: Widowed    Spouse Name: N/A    Number of Children: N/A  . Years of Education: N/A   Social History Main Topics  . Smoking status: Never Smoker   . Smokeless tobacco: Never Used  . Alcohol Use: Yes     Comment: daily gin or wine  . Drug Use: No  . Sexual Activity: None   Other Topics Concern  . None   Social History Narrative  . None    Outpatient Encounter Prescriptions as of 01/12/2013  Medication Sig Dispense Refill  . atorvastatin (LIPITOR) 20 MG tablet Take 20 mg by mouth daily.      .  clopidogrel (PLAVIX) 75 MG tablet TAKE 1 TABLET DAILY  90 tablet  2  . amoxicillin (AMOXIL) 500 MG capsule as needed. One hour prior to dental appt      . nitroGLYCERIN (NITROLINGUAL) 0.4 MG/SPRAY spray Place 1 spray under the tongue every 5 (five) minutes as needed.  12 g  8  . trimethoprim-polymyxin b (POLYTRIM) ophthalmic solution Place 1 drop into the right eye every 4 (four) hours. While awake  10 mL  0  . [DISCONTINUED] escitalopram (LEXAPRO) 10 MG tablet TAKE 1 TABLET BY MOUTH DAILY  30 tablet  5  . [DISCONTINUED] midodrine (PROAMATINE) 5 MG tablet Take 1 tablet (5 mg total) by mouth 3 (three) times daily.  90 tablet  3   No facility-administered encounter medications on file as of 01/12/2013.    EXAM:  BP 136/70  Pulse 82  Temp(Src) 98.1 F (36.7 C) (Oral)  Wt 149 lb (67.586 kg)  BMI 22.66 kg/m2  SpO2 97%  Body mass index is 22.66 kg/(m^2).  GENERAL: vitals reviewed and listed above, alert, oriented, appears well hydrated and in no acute distress HEENT: atraumatic, conjunctiva  clear bulbar right lower lid with some +1 swelling redness tenderness on eversion no lesion or foreign body. No discharge, no obvious  abnormalities on inspection of external nose and ears OP : no lesion edema or exudate  NECK: no obvious masses on inspection palpation no adenopathy PSYCH: pleasant and cooperative,  Discuss flu vaccine she was can hold off on this but I advised getting toda to get full valuation for syncope symptoms in [place] in a couple weeks. Has not had a problem taking flu shots in the past ASSESSMENT AND PLAN:  Discussed the following assessment and plan:  Infection of eyelid - poss underlying allergy vs internal  stye right lower  Need for prophylactic vaccination and inoculation against influenza - Plan: Flu Vaccine QUAD 36+ mos PF IM (Fluarix)  -Patient advised to return or notify health care team  if symptoms worsen or persist or new concerns arise.  Patient  Instructions  Warm compresses  Fu about 5 minutes   Antibiotic  Drops  After this about 4 x per day until better  Expect improvement in the next 5 days.    Neta Mends. Nikita Surman M.D.

## 2013-01-12 NOTE — Patient Instructions (Signed)
Warm compresses  Fu about 5 minutes   Antibiotic  Drops  After this about 4 x per day until better  Expect improvement in the next 5 days.

## 2013-01-15 ENCOUNTER — Ambulatory Visit (INDEPENDENT_AMBULATORY_CARE_PROVIDER_SITE_OTHER): Payer: Medicare Other | Admitting: Internal Medicine

## 2013-01-15 ENCOUNTER — Encounter: Payer: Self-pay | Admitting: Internal Medicine

## 2013-01-15 VITALS — BP 136/64 | HR 90 | Temp 98.0°F | Wt 150.0 lb

## 2013-01-15 DIAGNOSIS — H1045 Other chronic allergic conjunctivitis: Secondary | ICD-10-CM

## 2013-01-15 DIAGNOSIS — H01009 Unspecified blepharitis unspecified eye, unspecified eyelid: Secondary | ICD-10-CM

## 2013-01-15 DIAGNOSIS — H01003 Unspecified blepharitis right eye, unspecified eyelid: Secondary | ICD-10-CM

## 2013-01-15 DIAGNOSIS — H1011 Acute atopic conjunctivitis, right eye: Secondary | ICD-10-CM

## 2013-01-15 MED ORDER — TOBRAMYCIN 0.3 % OP OINT: TOPICAL_OINTMENT | Freq: Four times a day (QID) | OPHTHALMIC | Status: DC

## 2013-01-15 NOTE — Progress Notes (Signed)
Chief Complaint  Patient presents with  . Follow-up    Her eyes continue to itch and are not producing pus.    HPI: Had yellow discharge  Last pm and also itching now .    Not worse. Only righ tlower lid . No fever spreadings or new pain.  ROS: See pertinent positives and negatives per HPI.  Past Medical History  Diagnosis Date  . MELANOMA 09/06/2008  . HYPERLIPIDEMIA 10/06/2007  . HYPERTENSION 10/06/2007  . MYOCARDIAL INFARCTION, HX OF 10/06/2007  . CORONARY ARTERY DISEASE 10/06/2007    a. s/p PCI to LAD; b. s/p CABG; c. LHC 07/13/12: Proximal LAD stent patent, LIMA-LAD atretic, D1 occluded, proximal circumflex 30%, mid RCA occluded, SVG-D1 normal, SVG-OM2 normal, SVG-distal RCA normal, EF 40% with diffuse HK  . LBBB 09/06/2008  . Atrial fibrillation 10/06/2007    post op  . VARICOSE VEIN 09/29/2009  . UTERINE CANCER, HX OF 09/06/2008  . PERSONAL HX COLONIC POLYPS 10/30/2009  . Syncope   . Dizziness   . NICM (nonischemic cardiomyopathy)     Echocardiogram 07/12/12: EF 25-30%, diffuse HK, mild AI, mild MR, mild LAE    Family History  Problem Relation Age of Onset  . Stroke Mother   . Heart attack Father   . Colon cancer Neg Hx     History   Social History  . Marital Status: Widowed    Spouse Name: N/A    Number of Children: N/A  . Years of Education: N/A   Social History Main Topics  . Smoking status: Never Smoker   . Smokeless tobacco: Never Used  . Alcohol Use: Yes     Comment: daily gin or wine  . Drug Use: No  . Sexual Activity: None   Other Topics Concern  . None   Social History Narrative  . None    Outpatient Encounter Prescriptions as of 01/15/2013  Medication Sig Dispense Refill  . amoxicillin (AMOXIL) 500 MG capsule as needed. One hour prior to dental appt      . atorvastatin (LIPITOR) 20 MG tablet Take 20 mg by mouth daily.      . clopidogrel (PLAVIX) 75 MG tablet TAKE 1 TABLET DAILY  90 tablet  2  . nitroGLYCERIN (NITROLINGUAL) 0.4 MG/SPRAY spray Place 1  spray under the tongue every 5 (five) minutes as needed.  12 g  8  . trimethoprim-polymyxin b (POLYTRIM) ophthalmic solution Place 1 drop into the right eye every 4 (four) hours. While awake  10 mL  0  . tobramycin (TOBREX) 0.3 % ophthalmic ointment Apply to eye 4 (four) times daily.  3.5 g  0   No facility-administered encounter medications on file as of 01/15/2013.    EXAM:  BP 136/64  Pulse 90  Temp(Src) 98 F (36.7 C) (Oral)  Wt 150 lb (68.04 kg)  BMI 22.81 kg/m2  SpO2 98%  Body mass index is 22.81 kg/(m^2).  GENERAL: vitals reviewed and listed above, alert, oriented, appears well hydrated and in no acute distress right lower lid with localized redness  HEENT: atraumatic, conjunctiva bulbar clear  Right   Lower lid more localized redness  oviod no mass no lesion; ;   no obvious abnormalities on inspection of external nose and ears NECK: no obvious masses on inspection palpation  PSYCH: pleasant and cooperative, no obvious depression or anxiety  ASSESSMENT AND PLAN:  Discussed the following assessment and plan:  Blepharitis of right eye  Allergic conjunctivitis, right eye Localized reaction possibly underlying allergic  component at her allergy eyedrops and change topical. -Patient advised to return or notify health care team  if symptoms worsen or persist or new concerns arise.  Patient Instructions  Change to ointment  Lower lid 4 x per day  Add you  Allergy eye drops also  Expect imrpovment in the next  3 days contact us if not  And we may get eye doc to see you.    Neta Mends. Tavia Stave M.D. Patient states that her insurance will not pay for the tobramycin ointment will cost $150 we'll look for alternatives.

## 2013-01-15 NOTE — Patient Instructions (Signed)
Change to ointment  Lower lid 4 x per day  Add you  Allergy eye drops also  Expect imrpovment in the next  3 days contact us if not  And we may get eye doc to see you.

## 2013-01-16 ENCOUNTER — Telehealth: Payer: Self-pay | Admitting: Family Medicine

## 2013-01-16 NOTE — Telephone Encounter (Signed)
Pt should have called her insurance to see what is a covered medication and then she should have called me back so a new rx could have been called in.  I called the patient to see if she has called.  Left voicemail on her home machine.  Will wait on return call.

## 2013-01-16 NOTE — Telephone Encounter (Signed)
Pt states that she has obtained the medication. FYI.

## 2013-01-16 NOTE — Telephone Encounter (Signed)
Message copied by Nils Flack on Tue Jan 16, 2013  9:37 AM ------      Message from: [facility]      Created: Mon Jan 15, 2013  6:52 PM      Regarding: eye prep       Misty             others ideas for cost would be erythromycin ointment      Cipro drops or ointment      Gentamicin drops or ointment      Cant tel  What would  Be less expensive       WP             ------

## 2013-01-24 ENCOUNTER — Other Ambulatory Visit: Payer: Self-pay | Admitting: Internal Medicine

## 2013-01-26 ENCOUNTER — Ambulatory Visit: Payer: Medicare Other | Admitting: Internal Medicine

## 2013-01-30 ENCOUNTER — Ambulatory Visit: Payer: Medicare Other | Admitting: Internal Medicine

## 2013-01-31 ENCOUNTER — Ambulatory Visit: Payer: Medicare Other | Admitting: Internal Medicine

## 2013-02-01 ENCOUNTER — Encounter: Payer: Self-pay | Admitting: *Deleted

## 2013-02-01 ENCOUNTER — Encounter (HOSPITAL_COMMUNITY): Payer: Self-pay | Admitting: Pharmacy Technician

## 2013-02-01 ENCOUNTER — Ambulatory Visit (INDEPENDENT_AMBULATORY_CARE_PROVIDER_SITE_OTHER): Payer: Medicare Other | Admitting: Internal Medicine

## 2013-02-01 ENCOUNTER — Encounter: Payer: Self-pay | Admitting: Internal Medicine

## 2013-02-01 VITALS — BP 131/76 | HR 87 | Ht 67.0 in | Wt 145.8 lb

## 2013-02-01 DIAGNOSIS — R55 Syncope and collapse: Secondary | ICD-10-CM

## 2013-02-01 DIAGNOSIS — I441 Atrioventricular block, second degree: Secondary | ICD-10-CM

## 2013-02-01 DIAGNOSIS — I255 Ischemic cardiomyopathy: Secondary | ICD-10-CM

## 2013-02-01 DIAGNOSIS — I447 Left bundle-branch block, unspecified: Secondary | ICD-10-CM

## 2013-02-01 DIAGNOSIS — I951 Orthostatic hypotension: Secondary | ICD-10-CM

## 2013-02-01 DIAGNOSIS — I2589 Other forms of chronic ischemic heart disease: Secondary | ICD-10-CM

## 2013-02-01 DIAGNOSIS — I4891 Unspecified atrial fibrillation: Secondary | ICD-10-CM

## 2013-02-01 NOTE — Assessment & Plan Note (Signed)
As above.

## 2013-02-01 NOTE — Progress Notes (Signed)
    Patient Care Team: Bruce H Swords, MD as PCP - General   HPI  [name] [name] is a 79 y.o. female Seen in followup for syncope and actually orthostatic. She is to see the autonomic  neurologist [place] next week  She does however have a history of ischemic heart disease with prior MI and bypass surgery prior PCI to the LAD and previously normal left ventricular function. However, on evaluation 4/14 echocardiogram demonstrated EF 25-30%. LHC 4/14 Proximal LAD stent patent, LIMA-LAD atretic, D1 occluded, proximal circumflex 30%, mid RCA occluded, SVG-D1 normal, SVG-OM2 normal, SVG-distal RCA normal, EF 40% with diffuse HK  She has a history of atrial fibrillation that persisted for some years after her bypass surgery. It has been asymptomatic since then and her Coumadin was discontinued . Per chart as TIA listed in the problem list; according to her daughter this was a presumptive diagnosis and was never confirmed. MRI was negative    She has been having more problems with dizziness of late. This occurred consecutively following a prolonged standing episode in a very emotional movie. Her daughter also notes that her mother has been emotionally labile over the last couple of years with much greater tearfulness. It is her and her husband depression and anxiety/depression is a big part of the problem  Past Medical History  Diagnosis Date  . MELANOMA 09/06/2008  . HYPERLIPIDEMIA 10/06/2007  . HYPERTENSION 10/06/2007  . MYOCARDIAL INFARCTION, HX OF 10/06/2007  . CORONARY ARTERY DISEASE 10/06/2007    a. s/p PCI to LAD; b. s/p CABG; c. LHC 07/13/12: Proximal LAD stent patent, LIMA-LAD atretic, D1 occluded, proximal circumflex 30%, mid RCA occluded, SVG-D1 normal, SVG-OM2 normal, SVG-distal RCA normal, EF 40% with diffuse HK  . LBBB 09/06/2008  . Atrial fibrillation 10/06/2007    post op  . VARICOSE VEIN 09/29/2009  . UTERINE CANCER, HX OF 09/06/2008  . PERSONAL HX COLONIC POLYPS 10/30/2009  .  Syncope   . Dizziness   . NICM (nonischemic cardiomyopathy)     Echocardiogram 07/12/12: EF 25-30%, diffuse HK, mild AI, mild MR, mild LAE    Past Surgical History  Procedure Laterality Date  . Oophorectomy    . Right distal pretibeal      melanoma  . Coronary angioplasty with stent placement  2009  . Coronary artery bypass graft  2004  . Abdominal hysterectomy  1988  . Cardiac catheterization  08/30/2008    Current Outpatient Prescriptions  Medication Sig Dispense Refill  . amoxicillin (AMOXIL) 500 MG capsule as needed. One hour prior to dental appt      . atorvastatin (LIPITOR) 20 MG tablet TAKE 1 TABLET DAILY  90 tablet  0  . clopidogrel (PLAVIX) 75 MG tablet TAKE 1 TABLET DAILY  90 tablet  2  . nitroGLYCERIN (NITROLINGUAL) 0.4 MG/SPRAY spray Place 1 spray under the tongue every 5 (five) minutes as needed.  12 g  8   No current facility-administered medications for this visit.    Allergies  Allergen Reactions  . Nitroglycerin Other (See Comments)    sensitive to tabs which causes rapid drop in blood pressure. Is ok to use oral spray form    Review of Systems negative except from HPI and PMH  Physical Exam BP 131/76  Pulse 87  Ht 5' 7" (1.702 m)  Wt 145 lb 12.8 oz (66.134 kg)  BMI 22.83 kg/m2 Well developed and well nourished in no acute distress HENT normal E scleral and icterus clear Neck   Supple JVP flat; carotids brisk and full Clear to ausculation  *Regular rate and rhythm, no murmurs gallops or rub Soft with active bowel sounds No clubbing cyanosis none Edema Alert and oriented, grossly normal motor and sensory function Skin Warm and Dry    Assessment and  Plan  

## 2013-02-01 NOTE — Patient Instructions (Signed)
Your physician has recommended that you have a pacemaker inserted. A pacemaker is a small device that is placed under the skin of your chest or abdomen to help control abnormal heart rhythms. This device uses electrical pulses to prompt the heart to beat at a normal rate. Pacemakers are used to treat heart rhythms that are too slow. Wire (leads) are attached to the pacemaker that goes into the chambers of you heart. This is done in the hospital and usually requires and overnight stay. Please see the instruction sheet given to you today for more information.   Your physician recommends that you have lab work today: BMET/CBCD

## 2013-02-01 NOTE — Assessment & Plan Note (Addendum)
The patient has increasing heart block. With her underlying left bundle branch block small changes in her heart rate resulted in 2 to one block consistent with infrahisian heart block. The benefits and risks were reviewed including but not limited to death,  perforation, infection, lead dislodgement and device malfunction.  The patient understands agrees and is willing to proceed.  She will undergo CRT  EF>=40 obviates ICD

## 2013-02-01 NOTE — Assessment & Plan Note (Signed)
May be related to above

## 2013-02-01 NOTE — Assessment & Plan Note (Signed)
To see autonomic MD next week

## 2013-02-02 ENCOUNTER — Encounter (HOSPITAL_COMMUNITY): Payer: Self-pay | Admitting: *Deleted

## 2013-02-02 ENCOUNTER — Encounter (HOSPITAL_COMMUNITY)
Admission: RE | Disposition: A | Discharge status: Home / Self Care | Payer: Self-pay | Source: Ambulatory Visit | Attending: Internal Medicine

## 2013-02-02 ENCOUNTER — Ambulatory Visit (HOSPITAL_COMMUNITY)
Admission: RE | Admit: 2013-02-02 | Discharge: 2013-02-03 | Disposition: A | Discharge status: Home / Self Care | Payer: Medicare Other | Source: Ambulatory Visit | Attending: Internal Medicine | Admitting: Internal Medicine

## 2013-02-02 DIAGNOSIS — I5042 Chronic combined systolic (congestive) and diastolic (congestive) heart failure: Secondary | ICD-10-CM

## 2013-02-02 DIAGNOSIS — I255 Ischemic cardiomyopathy: Secondary | ICD-10-CM

## 2013-02-02 DIAGNOSIS — I480 Paroxysmal atrial fibrillation: Secondary | ICD-10-CM

## 2013-02-02 DIAGNOSIS — J438 Other emphysema: Secondary | ICD-10-CM | POA: Insufficient documentation

## 2013-02-02 DIAGNOSIS — I2510 Atherosclerotic heart disease of native coronary artery without angina pectoris: Secondary | ICD-10-CM | POA: Insufficient documentation

## 2013-02-02 DIAGNOSIS — I509 Heart failure, unspecified: Secondary | ICD-10-CM | POA: Insufficient documentation

## 2013-02-02 DIAGNOSIS — Z79899 Other long term (current) drug therapy: Secondary | ICD-10-CM | POA: Insufficient documentation

## 2013-02-02 DIAGNOSIS — R55 Syncope and collapse: Secondary | ICD-10-CM | POA: Insufficient documentation

## 2013-02-02 DIAGNOSIS — I2589 Other forms of chronic ischemic heart disease: Secondary | ICD-10-CM | POA: Insufficient documentation

## 2013-02-02 DIAGNOSIS — I441 Atrioventricular block, second degree: Secondary | ICD-10-CM

## 2013-02-02 DIAGNOSIS — I252 Old myocardial infarction: Secondary | ICD-10-CM | POA: Insufficient documentation

## 2013-02-02 DIAGNOSIS — I4891 Unspecified atrial fibrillation: Secondary | ICD-10-CM | POA: Insufficient documentation

## 2013-02-02 DIAGNOSIS — E785 Hyperlipidemia, unspecified: Secondary | ICD-10-CM | POA: Insufficient documentation

## 2013-02-02 DIAGNOSIS — I5022 Chronic systolic (congestive) heart failure: Secondary | ICD-10-CM

## 2013-02-02 DIAGNOSIS — I10 Essential (primary) hypertension: Secondary | ICD-10-CM | POA: Insufficient documentation

## 2013-02-02 DIAGNOSIS — I951 Orthostatic hypotension: Secondary | ICD-10-CM | POA: Insufficient documentation

## 2013-02-02 DIAGNOSIS — I447 Left bundle-branch block, unspecified: Secondary | ICD-10-CM | POA: Insufficient documentation

## 2013-02-02 DIAGNOSIS — J45909 Unspecified asthma, uncomplicated: Secondary | ICD-10-CM

## 2013-02-02 DIAGNOSIS — I5020 Unspecified systolic (congestive) heart failure: Secondary | ICD-10-CM | POA: Diagnosis present

## 2013-02-02 DIAGNOSIS — Z950 Presence of cardiac pacemaker: Secondary | ICD-10-CM

## 2013-02-02 HISTORY — DX: Chronic obstructive pulmonary disease, unspecified: J44.9

## 2013-02-02 HISTORY — PX: PERMANENT PACEMAKER INSERTION: SHX5480

## 2013-02-02 HISTORY — PX: BI-VENTRICULAR PACEMAKER INSERTION (CRT-P): SHX5750

## 2013-02-02 LAB — BASIC METABOLIC PANEL
BUN: 10 mg/dL (ref 6–23)
CO2: 27 mEq/L (ref 19–32)
CO2: 28 mEq/L (ref 19–32)
Calcium: 9.1 mg/dL (ref 8.4–10.5)
Chloride: 100 mEq/L (ref 96–112)
Chloride: 99 mEq/L (ref 96–112)
Creatinine, Ser: 0.7 mg/dL (ref 0.4–1.2)
Creatinine, Ser: 0.78 mg/dL (ref 0.50–1.10)
GFR calc Af Amer: 90 mL/min — ABNORMAL LOW (ref >90)
Glucose, Bld: 81 mg/dL (ref 70–99)
Potassium: 4 mEq/L (ref 3.5–5.1)

## 2013-02-02 LAB — CBC WITH DIFFERENTIAL/PLATELET
Basophils Relative: 0.3 % (ref 0.0–3.0)
Eosinophils Absolute: 0.1 10*3/uL (ref 0.0–0.7)
MCHC: 33.6 g/dL (ref 30.0–36.0)
MCV: 87.4 fl (ref 78.0–100.0)
Monocytes Absolute: 0.4 10*3/uL (ref 0.1–1.0)
Neutro Abs: 3.8 10*3/uL (ref 1.4–7.7)
Neutrophils Relative %: 66.5 % (ref 43.0–77.0)
RBC: 4.5 Mil/uL (ref 3.87–5.11)

## 2013-02-02 LAB — CBC
HCT: 40.1 % (ref 36.0–46.0)
Hemoglobin: 13.6 g/dL (ref 12.0–15.0)
MCH: 29.6 pg (ref 26.0–34.0)
MCHC: 33.9 g/dL (ref 30.0–36.0)
MCV: 87.2 fL (ref 78.0–100.0)
RDW: 13.1 % (ref 11.5–15.5)

## 2013-02-02 SURGERY — PERMANENT PACEMAKER INSERTION
Anesthesia: LOCAL

## 2013-02-02 MED ORDER — ACETAMINOPHEN 325 MG PO TABS
325.0000 mg | ORAL_TABLET | ORAL | Status: DC | PRN
  Administered 2013-02-02 (×2): 650 mg via ORAL
  Filled 2013-02-02: qty 1
  Filled 2013-02-02 (×2): qty 2

## 2013-02-02 MED ORDER — ONDANSETRON HCL 4 MG/2ML IJ SOLN: 4.0000 mg | Freq: Four times a day (QID) | INTRAMUSCULAR | Status: DC | PRN

## 2013-02-02 MED ORDER — MUPIROCIN 2 % EX OINT
TOPICAL_OINTMENT | CUTANEOUS | Status: AC
  Administered 2013-02-02: 1
  Filled 2013-02-02: qty 22

## 2013-02-02 MED ORDER — FENTANYL CITRATE 0.05 MG/ML IJ SOLN
INTRAMUSCULAR | Status: AC
  Filled 2013-02-02: qty 2

## 2013-02-02 MED ORDER — LIDOCAINE HCL (PF) 1 % IJ SOLN
INTRAMUSCULAR | Status: AC
  Filled 2013-02-02: qty 60

## 2013-02-02 MED ORDER — CEFAZOLIN SODIUM-DEXTROSE 2-3 GM-% IV SOLR
2.0000 g | INTRAVENOUS | Status: DC
  Filled 2013-02-02: qty 50

## 2013-02-02 MED ORDER — CEFAZOLIN SODIUM 1-5 GM-% IV SOLN
1.0000 g | Freq: Four times a day (QID) | INTRAVENOUS | Status: AC
  Administered 2013-02-02 – 2013-02-03 (×3): 1 g via INTRAVENOUS
  Filled 2013-02-02 (×3): qty 50

## 2013-02-02 MED ORDER — HEPARIN (PORCINE) IN NACL 2-0.9 UNIT/ML-% IJ SOLN
INTRAMUSCULAR | Status: AC
  Filled 2013-02-02: qty 500

## 2013-02-02 MED ORDER — CHLORHEXIDINE GLUCONATE 4 % EX LIQD
60.0000 mL | Freq: Once | CUTANEOUS | Status: DC
  Filled 2013-02-02: qty 60

## 2013-02-02 MED ORDER — MIDAZOLAM HCL 5 MG/5ML IJ SOLN
INTRAMUSCULAR | Status: AC
  Filled 2013-02-02: qty 5

## 2013-02-02 MED ORDER — GENTAMICIN SULFATE 40 MG/ML IJ SOLN
80.0000 mg | INTRAMUSCULAR | Status: DC
  Filled 2013-02-02: qty 2

## 2013-02-02 MED ORDER — SODIUM CHLORIDE 0.9 % IV SOLN
INTRAVENOUS | Status: DC
  Administered 2013-02-02: 50 mL/h via INTRAVENOUS

## 2013-02-02 NOTE — H&P (View-Only) (Signed)
Patient Care Team: Lindley Magnus, MD as PCP - General   HPI  [name] [name] is a 79 y.o. female Seen in followup for syncope and actually orthostatic. She is to see the autonomic  neurologist [facility] next week  She does however have a history of ischemic heart disease with prior MI and bypass surgery prior PCI to the LAD and previously normal left ventricular function. However, on evaluation 4/14 echocardiogram demonstrated EF 25-30%. LHC 4/14 Proximal LAD stent patent, LIMA-LAD atretic, D1 occluded, proximal circumflex 30%, mid RCA occluded, SVG-D1 normal, SVG-OM2 normal, SVG-distal RCA normal, EF 40% with diffuse HK  She has a history of atrial fibrillation that persisted for some years after her bypass surgery. It has been asymptomatic since then and her Coumadin was discontinued . Per chart as TIA listed in the problem list; according to her daughter this was a presumptive diagnosis and was never confirmed. MRI was negative    She has been having more problems with dizziness of late. This occurred consecutively following a prolonged standing episode in a very emotional movie. Her daughter also notes that her mother has been emotionally labile over the last couple of years with much greater tearfulness. It is her and her husband depression and anxiety/depression is a big part of the problem  Past Medical History  Diagnosis Date  . MELANOMA 09/06/2008  . HYPERLIPIDEMIA 10/06/2007  . HYPERTENSION 10/06/2007  . MYOCARDIAL INFARCTION, HX OF 10/06/2007  . CORONARY ARTERY DISEASE 10/06/2007    a. s/p PCI to LAD; b. s/p CABG; c. LHC 07/13/12: Proximal LAD stent patent, LIMA-LAD atretic, D1 occluded, proximal circumflex 30%, mid RCA occluded, SVG-D1 normal, SVG-OM2 normal, SVG-distal RCA normal, EF 40% with diffuse HK  . LBBB 09/06/2008  . Atrial fibrillation 10/06/2007    post op  . VARICOSE VEIN 09/29/2009  . UTERINE CANCER, HX OF 09/06/2008  . PERSONAL HX COLONIC POLYPS 10/30/2009  .  Syncope   . Dizziness   . NICM (nonischemic cardiomyopathy)     Echocardiogram 07/12/12: EF 25-30%, diffuse HK, mild AI, mild MR, mild LAE    Past Surgical History  Procedure Laterality Date  . Oophorectomy    . Right distal pretibeal      melanoma  . Coronary angioplasty with stent placement  2009  . Coronary artery bypass graft  2004  . Abdominal hysterectomy  1988  . Cardiac catheterization  08/30/2008    Current Outpatient Prescriptions  Medication Sig Dispense Refill  . amoxicillin (AMOXIL) 500 MG capsule as needed. One hour prior to dental appt      . atorvastatin (LIPITOR) 20 MG tablet TAKE 1 TABLET DAILY  90 tablet  0  . clopidogrel (PLAVIX) 75 MG tablet TAKE 1 TABLET DAILY  90 tablet  2  . nitroGLYCERIN (NITROLINGUAL) 0.4 MG/SPRAY spray Place 1 spray under the tongue every 5 (five) minutes as needed.  12 g  8   No current facility-administered medications for this visit.    Allergies  Allergen Reactions  . Nitroglycerin Other (See Comments)    sensitive to tabs which causes rapid drop in blood pressure. Is ok to use oral spray form    Review of Systems negative except from HPI and PMH  Physical Exam BP 131/76  Pulse 87  Ht 5\' 7"  (1.702 m)  Wt 145 lb 12.8 oz (66.134 kg)  BMI 22.83 kg/m2 Well developed and well nourished in no acute distress HENT normal E scleral and icterus clear Neck  Supple JVP flat; carotids brisk and full Clear to ausculation  *Regular rate and rhythm, no murmurs gallops or rub Soft with active bowel sounds No clubbing cyanosis none Edema Alert and oriented, grossly normal motor and sensory function Skin Warm and Dry    Assessment and  Plan

## 2013-02-02 NOTE — Interval H&P Note (Signed)
History and Physical Interval Note: Patient seen and examined. I have reviewed the history, physical exam, assessment and plan from Dr. Graciela Husbands. Will plan to proceed with BiV PPM insertion. 02/02/2013 10:59 AM  [name] [name]  has presented today for surgery, with the diagnosis of Heart block  The various methods of treatment have been discussed with the patient and family. After consideration of risks, benefits and other options for treatment, the patient has consented to  Procedure(s): PERMANENT PACEMAKER INSERTION (N/A) as a surgical intervention .  The patient's history has been reviewed, patient examined, no change in status, stable for surgery.  I have reviewed the patient's chart and labs.  Questions were answered to the patient's satisfaction.     [name] [name].D.

## 2013-02-02 NOTE — Op Note (Signed)
NAMERENE, GONSOULIN NO.:  [id number]  MEDICAL RECORD NO.:  [id number]  LOCATION:  6C02C                        FACILITY:  [facility]  PHYSICIAN:  Doylene Canning. Ladona Ridgel, MD    DATE OF BIRTH:  1931-11-13  DATE OF PROCEDURE:  02/02/2013 DATE OF DISCHARGE:                              OPERATIVE REPORT   PROCEDURE PERFORMED:  Insertion of a biventricular pacemaker.  INDICATION:  Symptomatic 2:1 heart block with underlying left bundle- branch block, and chronic systolic heart failure, ejection fraction 25% to 30%.  INTRODUCTION:  The patient is a 79 year old woman with a history of coronary artery disease status post bypass surgery, also with a history of recurrent dizzy spells and documented orthostasis.  She has chronic left bundle-branch block.  She became dizzy and was found to have 2:1 heart block which was symptomatic with ventricular rates of 40 to 45 beats per minute.  She also has class 2 heart failure symptoms in conjunction with her ejection fraction of 25%.  She is now referred for biventricular pacemaker insertion.  OPERATIVE PROCEDURE:  After informed was obtained, the patient was taken to the diagnostic EP lab in a fasting state.  After usual preparation and draping, intravenous fentanyl and midazolam were given for sedation. A 30 mL of lidocaine was infiltrated into the left infraclavicular region.  A 5-cm incision was carried out over this region and electrocautery was utilized to dissect down to the fascial plane.  The left subclavian vein was punctured x3.  The St. Jude Tendril STS bipolar pacing lead, serial [id number] was advanced into the right ventricle and the St. Jude model 2088T 46-cm active fixation pacing lead, serial #[id number] was advanced to the right atrium.  Mapping was carried out first in the right ventricle.  At the final site, the R-waves measured 9 mV.  The lead actively fixed.  Had a large injury current and the pacing impedance was  around 800 ohms.  The threshold was 1 V at 0.4 milliseconds.  A 10 V pacing did not stimulate the diaphragm.  With the ventricular lead in satisfactory position, attention was then turned to placement of the atrial lead which was placed in the anterolateral portion of the right atrium where the P-waves measured 1.5 mV.  There was a large injury current with active fixation of the lead.  A 10 V pacing did not stimulate the diaphragm.  The pacing impedance with the lead actively fixed was 396 ohms and the threshold was 0.8 V at 0.4 milliseconds.  With both atrial and RV leads in satisfactory position, attention was turned to the placement of the left ventricular lead.  The St. Jude guiding catheter was advanced along with a 6-French hexapolar EP catheter and used to cannulate the coronary sinus.  Venography of the coronary sinus was carried out.  This demonstrated a fairly large posterior vein and a very small lateral vein along with an anterior vein.  Initially, we tried to cannulate the lateral vein, but had an unusual takeoff and had an area that appeared to be subtotally occluded. Angioplasty guidewires could not get into the vein and it was abandoned at the place of left ventricular lead placement.  Next, we went to the anterior vein to see if there was a lateral branch and there was not. Finally, we went back to the posterior vein which was a separate vein from the middle cardiac vein.  A 0.14 angioplasty guidewire was advanced into this posterior vein and venography was carried out after it was sub selectively cannulated.  This demonstrated no significant branching. The vein did course all the way near the inferior apex of the left ventricle.  The St. Jude Quartet, model 1458Q, 86 cm quadripolar pacing lead, serial [id number] was advanced over the angioplasty guidewire into the posterior vein.  The guiding catheter was liberated in the usual manner and the lead was secured to the  subpectoral fascia with a figure- of-eight silk suture.  Pacing thresholds in the left ventricular lead were satisfactory in multiple configurations under 2 V at 0.4 milliseconds.  The pacing impedance was 600 ohms in the most proximal bipolar segment.  At this point, the subcutaneous pocket was then made with the electrocautery and the pocket was irrigated with antibiotic irrigation.  Electrocautery was utilized to assure hemostasis.  The St. Jude Allure Quadra RF biventricular pacemaker, serial [id number] was connected to the atrial RV and LV leads and placed back in the subcutaneous pocket.  The pocket was irrigated with additional antibiotic irrigation and the incision was closed with 2-0 and 3-0 Vicryl.  Benzoin and Steri-Strips were painted on the skin, pressure dressing was applied, and the patient was returned to her room in satisfactory condition.  COMPLICATIONS:  There were no immediate procedure complications.  RESULTS:  This demonstrated successful implantation of a St. Jude biventricular pacemaker in a patient with symptomatic 2:1 AV block, chronic systolic heart failure, ejection fraction 25%, class II heart failure with prior coronary artery disease status post bypass surgery.     Doylene Canning. Ladona Ridgel, MD     GWT/MEDQ  D:  02/02/2013  T:  02/02/2013  Job:  [id number]  cc:   Dr. Viviann Spare

## 2013-02-02 NOTE — CV Procedure (Signed)
BiV PPM insertion via the left subclavian vein without immediate complication. W#[id number].

## 2013-02-03 ENCOUNTER — Encounter (HOSPITAL_COMMUNITY): Payer: Self-pay | Admitting: Physician Assistant

## 2013-02-03 ENCOUNTER — Telehealth: Payer: Self-pay | Admitting: Physician Assistant

## 2013-02-03 ENCOUNTER — Ambulatory Visit (HOSPITAL_COMMUNITY): Payer: Medicare Other

## 2013-02-03 DIAGNOSIS — I2510 Atherosclerotic heart disease of native coronary artery without angina pectoris: Secondary | ICD-10-CM

## 2013-02-03 DIAGNOSIS — I480 Paroxysmal atrial fibrillation: Secondary | ICD-10-CM

## 2013-02-03 DIAGNOSIS — I509 Heart failure, unspecified: Secondary | ICD-10-CM

## 2013-02-03 DIAGNOSIS — J45909 Unspecified asthma, uncomplicated: Secondary | ICD-10-CM

## 2013-02-03 MED ORDER — CARVEDILOL 3.125 MG PO TABS: 3.1250 mg | ORAL_TABLET | Freq: Two times a day (BID) | ORAL | Status: DC

## 2013-02-03 NOTE — Telephone Encounter (Signed)
Error

## 2013-02-03 NOTE — Progress Notes (Signed)
SUBJECTIVE: [name] [name] is doing well after Bi-V pacemaker placement by Dr. Ladona Ridgel on 10/24. She has some mild chest soreness but denies shortness of breath, and is hopeful that this procedure will relieve her prior symptoms of dizziness and syncope.     Intake/Output Summary (Last 24 hours) at 02/03/13 0853 Last data filed at 02/03/13 0751  Gross per 24 hour  Intake    390 ml  Output   1300 ml  Net   -910 ml    Current Facility-Administered Medications  Medication Dose Route Frequency Provider Last Rate Last Dose  . acetaminophen (TYLENOL) tablet 325-650 mg  325-650 mg Oral Q4H PRN Marinus Maw, MD   650 mg at 02/02/13 2205  . ondansetron (ZOFRAN) injection 4 mg  4 mg Intravenous Q6H PRN Marinus Maw, MD        Filed Vitals:   02/03/13 1610 02/03/13 0400 02/03/13 0405 02/03/13 0748  BP:  149/76 149/76 151/77  Pulse:    74  Temp: 97.7 F (36.5 C)   97.9 F (36.6 C)  TempSrc: Oral   Oral  Resp:  18  18  Height:      Weight:      SpO2:    97%    PHYSICAL EXAM General: NAD Neck: No JVD, no thyromegaly or thyroid nodule.  Lungs: Clear to auscultation bilaterally with normal respiratory effort. CV: Nondisplaced PMI.  No chest wall hematoma. Heart regular S1/S2, no S3/S4, no murmur.  No peripheral edema.  No carotid bruit.  Normal pedal pulses.  Abdomen: Soft, nontender, no hepatosplenomegaly, no distention.  Neurologic: Alert and oriented x 3.  Psych: Normal affect. Extremities: No clubbing or cyanosis.   TELEMETRY: Reviewed telemetry pt in paced rhythm, 70 bpm range.  LABS: Basic Metabolic Panel:  Recent Labs  96/04/54 1613 02/02/13 1037  NA 134* 137  K 4.0 4.0  CL 100 99  CO2 27 28  GLUCOSE 81 104*  BUN 12 10  CREATININE 0.7 0.78  CALCIUM 9.0 9.1   Liver Function Tests: No results found for this basename: AST, ALT, ALKPHOS, BILITOT, PROT, ALBUMIN,  in the last 72 hours No results found for this basename: LIPASE, AMYLASE,  in the last 72  hours CBC:  Recent Labs  02/01/13 1613 02/02/13 1037  WBC 5.7 4.4  NEUTROABS 3.8  --   HGB 13.2 13.6  HCT 39.3 40.1  MCV 87.4 87.2  PLT 134.0* 120*   Cardiac Enzymes: No results found for this basename: CKTOTAL, CKMB, CKMBINDEX, TROPONINI,  in the last 72 hours BNP: No components found with this basename: POCBNP,  D-Dimer: No results found for this basename: DDIMER,  in the last 72 hours Hemoglobin A1C: No results found for this basename: HGBA1C,  in the last 72 hours Fasting Lipid Panel: No results found for this basename: CHOL, HDL, LDLCALC, TRIG, CHOLHDL, LDLDIRECT,  in the last 72 hours Thyroid Function Tests: No results found for this basename: TSH, T4TOTAL, FREET3, T3FREE, THYROIDAB,  in the last 72 hours Anemia Panel: No results found for this basename: VITAMINB12, FOLATE, FERRITIN, TIBC, IRON, RETICCTPCT,  in the last 72 hours  RADIOLOGY: Dg Chest 2 View  02/03/2013   CLINICAL DATA:  cardiac arrhythmia  EXAM: CHEST  2 VIEW  COMPARISON:  October 04, 2012  FINDINGS: There is night pacemaker with leads attached to the right atrium, right ventricle, and left ventricle. No pneumothorax.  There is a degree of underlying emphysema. There is  mild scarring in the left base. The lungs are otherwise clear. The heart is borderline enlarged. Pulmonary vascularity reflects underlying emphysema. No adenopathy. No bone lesions. The patient is status post internal mammary bypass grafting.  IMPRESSION: Pacemaker as described. No pneumothorax. Underlying emphysema. No edema or consolidation. Mild cardiac enlargement.   Electronically Signed   By: Bretta Bang M.D.   On: 02/03/2013 07:32      ASSESSMENT AND PLAN: 1. LBBB and symptomatic 2:1 block s/p biventricular pacemaker placement: symptomatically stable. No evidence of pneumothorax. Pacemaker interrogated and functioning appropriately.  2. Chronic systolic heart failure: compensated, appears to be euvolemic. 3. CAD: stable. No  changes in therapy.  Dispo: plan to d/c to home.   Prentice Docker, M.D., F.A.C.C.

## 2013-02-03 NOTE — Discharge Summary (Signed)
Discharge Summary   Patient ID: [name] [name],  MRN: [id number], DOB/AGE: December 17, 1931 79 y.o.  Admit date: 02/02/2013 Discharge date: 02/03/2013  Primary Physician: Judie Petit, MD Primary Electrophysiologist: Odessa Fleming, MD  Discharge Diagnoses Principal Problem:   Mobitz type 2 second degree AV block  - S/p St. Jude CRT-P 02/02/13 Active Problems:   Orthostatic hypotension  - Undergoing evaluation by neurologist for autonomic dysfunction   Syncope  - Suspected to be secondary to brady-arrythmia as above or orthostatic hypotension   HYPERLIPIDEMIA  - Continue statin   HYPERTENSION  - Carvedilol added as below   MYOCARDIAL INFARCTION, HX OF  - Discharged on Plavix 75, carvedilol, atorvastatin, NTG SL spray   Ischemic cardiomyopathy  EF 40% cath 4/14  - Will start on carvedilol 3.125 with h/o CAD, ischemic cardiomyopathy and PPM placement  - Consider adding ACEi on follow-up    LBBB   Chronic combined systolic and diastolic congestive heart failure  - No evidence of volume overload this admission   Paroxysmal atrial fibrillation  - Post-bypass surgery  - Previously on Coumadin. Discontinued given no further evidence of a-fib.   - NSR this admission. Readdress anticoagulation on follow-up given CHADSVASc of 8 (CHF, HTN, age > 37, TIA, CAD, female)    COPD  - Emphysema noted on PA/lateral CXR  - Follow-up PCP  Allergies Allergies  Allergen Reactions  . Nitroglycerin Other (See Comments)    sensitive to tabs which causes rapid drop in blood pressure. Is ok to use oral spray form    Diagnostic Studies/Procedures  PERMANENT PACEMAKER PLACEMENT - 02/02/13  See op note for full report:  Successful implantation of a St. Jude biventricular pacemaker in a patient with symptomatic 2:1 AV block, chronic systolic heart failure, ejection fraction 25%, class II heart failure with prior coronary artery disease status post bypass surgery. Serial [id number].   PA/LATERAL CHEST  X-RAY- 02/03/13  IMPRESSION:  Pacemaker as described. No pneumothorax. Underlying emphysema. No  edema or consolidation. Mild cardiac enlargement.  History of Present Illness  [name] [name] is a 79 y.o. female with the above problem list who underwent elective PPM placement on 10/24 at [facility].  She has a history of CAD s/p CABG in 2004 after cath revealed severe 3V CAD and EF 30% (ischemic cardiomyopathy). Echocardiogram obtained for syncope work-up 07/12/12 indicated reduced EF at 25-30%. She subsequently underwent LHC 4/14 showing Proximal LAD stent patent, LIMA-LAD atretic, D1 occluded, proximal circumflex 30%, mid RCA occluded, SVG-D1 normal, SVG-OM2 normal, SVG-distal RCA normal, EF 40% with diffuse HK. She was found to be orthostatic and also diagnosed with Mobitz type II heart block. She followed up with Dr. Graciela Husbands 02/01/13 and the decision was made to proceed with PPM placement. Bi-V component was warranted due to reduced EF (not felt to be ischemic from cath above) and LBBB with possible LV dyssynchrony-induced cardiomyopathy.The risks, benefits and details of the procedure were explained to the patient who agreed to proceed. This was scheduled for yesterday.   Hospital Course   She was consented and prepped for the procedure. For full details, please review the operative report. This resulted in the successful placement of a St. Jude Bi-V pacemaker. She tolerated the procedure well without complications. She remained stable overnight without acute incidents. CXR this AM, as above, did indicate underlying emphysematous changes, but no post-op complication such as pneumothorax. PPM interrogation post-device placement revealed normal functionality.   She was evaluated by Dr. Darl Householder this AM, and she was  deemed to be stable for discharge. She was a bit hypertensive this AM (SBP 150s). She will be started on carvedilol 3.125 mg BID given her underlying CAD and ischemic  cardiomyopathy. Bradycardia no longer prohibitive with PPM. CAD regimen will now include Plavix 75, carvedilol 3.125 BID, atorvastatin 40, NTG SL spray. Consider adding ACEi as an outpatient, although her underlying LBBB and LV dyssynchrony may be contributing to LV dysfunction. Therefore, could hold off on adding ACEi if EF improves unless there is an alternative indication (persistent HTN with DM2/prediabetes). Will leave up to primary cardiologist/PCP. Consider repeat echo in 3 months. Advised follow-up with PCP for COPD evaluation. This information, including post-device activity restrictions, has been clearly outlined in the discharge AVS.   Discharge Vitals:  Blood pressure 151/77, pulse 74, temperature 97.9 F (36.6 C), temperature source Oral, resp. rate 18, height 5\' 7"  (1.702 m), weight 66 kg (145 lb 8.1 oz), SpO2 97.00%.   Labs: Recent Labs     02/01/13  1613  02/02/13  1037  WBC  5.7  4.4  HGB  13.2  13.6  HCT  39.3  40.1  MCV  87.4  87.2  PLT  134.0*  120*    Recent Labs Lab 02/01/13 1613 02/02/13 1037  NA 134* 137  K 4.0 4.0  CL 100 99  CO2 27 28  BUN 12 10  CREATININE 0.7 0.78  CALCIUM 9.0 9.1  GLUCOSE 81 104*   Disposition:  Discharge Orders   Future Appointments Provider Department Dept Phone   03/13/2013 9:00 AM Lindley Magnus, MD [facility] [phone]   Future Orders Complete By Expires   Diet - low sodium heart healthy  As directed    Increase activity slowly  As directed          Follow-up Information   Follow up with [facility]. (Office will call you with an appointment with the device clinic to be scheduled in 7-10 days. )    Contact information:   [address] [place] [address]       Follow up with Sherryl Manges, MD In 3 months. (Office will call you to schedule a follow-up pacemaker appointment)    Specialty:  Cardiology   Contact information:   [address] [place] [address] [phone]       Follow up with Judie Petit, MD. (For evaluation of possible COPD detected on chest x-ray this admission. )    Specialties:  Internal Medicine, Radiology   Contact information:   [address] [facility] [place] [place] [address] [phone]       Discharge Medications:    Medication List         atorvastatin 20 MG tablet  Commonly known as:  LIPITOR  Take 20 mg by mouth every evening.     carvedilol 3.125 MG tablet  Commonly known as:  COREG  Take 1 tablet (3.125 mg total) by mouth 2 (two) times daily with a meal.     clopidogrel 75 MG tablet  Commonly known as:  PLAVIX  Take 75 mg by mouth every evening.     nitroGLYCERIN 0.4 MG/SPRAY spray  Commonly known as:  NITROLINGUAL  Place 1 spray under the tongue every 5 (five) minutes as needed for chest pain.       Outstanding Labs/Studies: echo in 3 months  Duration of Discharge Encounter: Greater than 30 minutes including physician time.  Signed, R.  Hurman Horn, PA-C 02/03/2013, 11:14 AM

## 2013-02-03 NOTE — Discharge Summary (Signed)
See my progress note from today

## 2013-02-07 ENCOUNTER — Telehealth: Payer: Self-pay | Admitting: Internal Medicine

## 2013-02-07 MED ORDER — HYDROCORTISONE ACETATE 25 MG RE SUPP: 25.0000 mg | Freq: Every day | RECTAL | Status: DC | PRN

## 2013-02-07 NOTE — Telephone Encounter (Signed)
rx sent in electronically 

## 2013-02-07 NOTE — Telephone Encounter (Signed)
Pt has hemorroids and requesting a new rx  anusol hd suppository call into [facility]

## 2013-02-12 ENCOUNTER — Ambulatory Visit: Payer: Medicare Other

## 2013-02-13 ENCOUNTER — Ambulatory Visit (INDEPENDENT_AMBULATORY_CARE_PROVIDER_SITE_OTHER): Payer: Medicare Other | Admitting: *Deleted

## 2013-02-13 DIAGNOSIS — I255 Ischemic cardiomyopathy: Secondary | ICD-10-CM

## 2013-02-13 DIAGNOSIS — I4891 Unspecified atrial fibrillation: Secondary | ICD-10-CM

## 2013-02-13 DIAGNOSIS — I480 Paroxysmal atrial fibrillation: Secondary | ICD-10-CM

## 2013-02-13 DIAGNOSIS — I509 Heart failure, unspecified: Secondary | ICD-10-CM

## 2013-02-13 DIAGNOSIS — I5042 Chronic combined systolic (congestive) and diastolic (congestive) heart failure: Secondary | ICD-10-CM

## 2013-02-13 DIAGNOSIS — I2589 Other forms of chronic ischemic heart disease: Secondary | ICD-10-CM

## 2013-02-13 LAB — PACEMAKER DEVICE OBSERVATION
AL AMPLITUDE: 2.2 mv
AL IMPEDENCE PM: 462.5 Ohm
AL THRESHOLD: 0.75 V
BAMS-0001: 150 {beats}/min
DEVICE MODEL PM: 7516165
RV LEAD AMPLITUDE: 12 mv
RV LEAD THRESHOLD: 0.5 V

## 2013-02-13 NOTE — Progress Notes (Signed)
Wound check appointment. Steri-strips removed. Wound without redness or edema. Incision edges approximated, wound well healed. Normal device function. Thresholds, sensing, and impedances consistent with implant measurements. Device programmed at 3.5V/auto capture programmed on for extra safety margin until 3 month visit. Histogram distribution appropriate for patient and level of activity. 2 mode switches 6 seconds.  No high ventricular rates noted.  AV delays reprogrammed 130/110 msec since the patient was only bi V pacing 27%.   Patient educated about wound care, arm mobility, lifting restrictions. ROV in 3 months with implanting physician.

## 2013-02-15 ENCOUNTER — Telehealth: Payer: Self-pay | Admitting: Internal Medicine

## 2013-02-15 NOTE — Telephone Encounter (Signed)
New message    Had pacemaker put in 1wk ago.  Pt want to start cardiac rehab at [facility].  Can doc start the process?

## 2013-02-16 NOTE — Telephone Encounter (Signed)
lmtcb

## 2013-02-16 NOTE — Telephone Encounter (Signed)
New message    Pt's daughter want her mother to enter [facility].  If ok with Dr Jamse Belfast you give the ok to cardiac rehab?

## 2013-02-19 NOTE — Telephone Encounter (Signed)
Patient explains that her steri's have been removed. I will see about setting her up Wednesday when I return to office.  Patient agreeable to plan.

## 2013-02-28 NOTE — Telephone Encounter (Signed)
Advised that cardiac rehab referral faxed out today. Patient's daughter verbalized understanding.

## 2013-03-07 ENCOUNTER — Ambulatory Visit (INDEPENDENT_AMBULATORY_CARE_PROVIDER_SITE_OTHER): Payer: Medicare Other | Admitting: Internal Medicine

## 2013-03-07 ENCOUNTER — Encounter: Payer: Self-pay | Admitting: Internal Medicine

## 2013-03-07 VITALS — BP 124/68 | HR 76 | Temp 98.1°F | Ht 67.0 in | Wt 146.0 lb

## 2013-03-07 DIAGNOSIS — I2589 Other forms of chronic ischemic heart disease: Secondary | ICD-10-CM

## 2013-03-07 DIAGNOSIS — I951 Orthostatic hypotension: Secondary | ICD-10-CM

## 2013-03-07 DIAGNOSIS — I10 Essential (primary) hypertension: Secondary | ICD-10-CM

## 2013-03-07 DIAGNOSIS — I441 Atrioventricular block, second degree: Secondary | ICD-10-CM

## 2013-03-07 DIAGNOSIS — I255 Ischemic cardiomyopathy: Secondary | ICD-10-CM

## 2013-03-07 DIAGNOSIS — E785 Hyperlipidemia, unspecified: Secondary | ICD-10-CM

## 2013-03-07 LAB — HEPATIC FUNCTION PANEL
AST: 18 U/L (ref 0–37)
Albumin: 3.9 g/dL (ref 3.5–5.2)
Alkaline Phosphatase: 55 U/L (ref 39–117)
Total Protein: 6.1 g/dL (ref 6.0–8.3)

## 2013-03-07 MED ORDER — FLUTICASONE PROPIONATE 50 MCG/ACT NA SUSP: 2.0000 | Freq: Every day | NASAL | Status: DC

## 2013-03-07 NOTE — Assessment & Plan Note (Signed)
Resolved-  Will remove from problem list

## 2013-03-07 NOTE — Progress Notes (Signed)
Pre visit review using our clinic review tool, if applicable. No additional management support is needed unless otherwise documented below in the visit note. 

## 2013-03-07 NOTE — Assessment & Plan Note (Signed)
S/p pacemaker

## 2013-03-07 NOTE — Assessment & Plan Note (Signed)
Check liver profile- not done in >1 year

## 2013-03-07 NOTE — Progress Notes (Signed)
CAD- no sxs, addressing risk factors  Chronic CHF- no DOE, SOB, PND  PAF- no recurrence  New problem-- reviewed chart- she has a PPM for second degree heart block.  Reviewed pmh, psh, sochx, meds  Ros- she admits to being deconditioned and has gained weight. She relates all of this to a prolonged period of being sedentary.    Well-developed well-nourished female in no acute distress. HEENT exam atraumatic, normocephalic, extraocular muscles are intact. Neck is supple. No jugular venous distention no thyromegaly. Chest clear to auscultation without increased work of breathing. Cardiac exam S1 and S2 are regular. Abdominal exam active bowel sounds, soft, nontender. Extremities no edema. Neurologic exam she is alert without any motor sensory deficits. Gait is normal.

## 2013-03-08 DIAGNOSIS — I442 Atrioventricular block, complete: Secondary | ICD-10-CM | POA: Insufficient documentation

## 2013-03-08 NOTE — Assessment & Plan Note (Signed)
Doing well s/p PPM

## 2013-03-08 NOTE — Assessment & Plan Note (Signed)
Clinically without sxs Continue current meds

## 2013-03-08 NOTE — Assessment & Plan Note (Signed)
Controlled Continue current meds 

## 2013-03-12 ENCOUNTER — Telehealth: Payer: Self-pay | Admitting: Internal Medicine

## 2013-03-12 NOTE — Telephone Encounter (Signed)
New Problem:  Pt states she has a temporary ID card for her device. Pt is asking when can she expect her permanent ID card. Pt is requesting a call back

## 2013-03-12 NOTE — Telephone Encounter (Signed)
New ID card ordered from [facility], patient aware.

## 2013-03-13 ENCOUNTER — Ambulatory Visit: Payer: Medicare Other | Admitting: Internal Medicine

## 2013-03-22 ENCOUNTER — Encounter: Payer: Self-pay | Admitting: Internal Medicine

## 2013-04-09 ENCOUNTER — Telehealth: Payer: Self-pay | Admitting: Pharmacist

## 2013-04-09 NOTE — Telephone Encounter (Signed)
Pt's daughter  called to report pt has been having muscle pains, especially in her feet, and will not get up and do much during the day.  She is concerned it may be related to her statin or beta blocker as these are the 2 most recent changes.  Explained to daughter that this is most likely related to statin.  Asked her to have pt hold statin x 1 week and see if there is any improvement and call with an update.

## 2013-04-16 ENCOUNTER — Encounter: Payer: Self-pay | Admitting: Internal Medicine

## 2013-04-24 ENCOUNTER — Other Ambulatory Visit: Payer: Self-pay | Admitting: Internal Medicine

## 2013-04-30 ENCOUNTER — Encounter: Payer: Self-pay | Admitting: Internal Medicine

## 2013-04-30 ENCOUNTER — Telehealth: Payer: Self-pay | Admitting: Internal Medicine

## 2013-04-30 NOTE — Telephone Encounter (Signed)
New problem   Pt need more information concerning her pacer check and her echocardiogram. Please call pt.

## 2013-05-01 NOTE — Telephone Encounter (Signed)
Pt wanted to verify plan. I explained date/time and appt with Caryl Comes after echo. Pt agreeable to plan.

## 2013-05-11 ENCOUNTER — Encounter: Payer: Medicare Other | Admitting: Internal Medicine

## 2013-05-11 ENCOUNTER — Other Ambulatory Visit (HOSPITAL_COMMUNITY): Payer: Medicare Other

## 2013-05-21 ENCOUNTER — Ambulatory Visit (HOSPITAL_COMMUNITY): Payer: Medicare Other | Attending: Internal Medicine | Admitting: Radiology

## 2013-05-21 ENCOUNTER — Ambulatory Visit (INDEPENDENT_AMBULATORY_CARE_PROVIDER_SITE_OTHER): Payer: Medicare Other | Admitting: Internal Medicine

## 2013-05-21 ENCOUNTER — Other Ambulatory Visit: Payer: Self-pay | Admitting: *Deleted

## 2013-05-21 ENCOUNTER — Encounter: Payer: Self-pay | Admitting: Internal Medicine

## 2013-05-21 ENCOUNTER — Other Ambulatory Visit: Payer: Self-pay

## 2013-05-21 VITALS — BP 156/85 | HR 76 | Ht 67.0 in | Wt 145.0 lb

## 2013-05-21 DIAGNOSIS — I441 Atrioventricular block, second degree: Secondary | ICD-10-CM | POA: Insufficient documentation

## 2013-05-21 DIAGNOSIS — I2589 Other forms of chronic ischemic heart disease: Secondary | ICD-10-CM

## 2013-05-21 DIAGNOSIS — Z951 Presence of aortocoronary bypass graft: Secondary | ICD-10-CM | POA: Insufficient documentation

## 2013-05-21 DIAGNOSIS — I4891 Unspecified atrial fibrillation: Secondary | ICD-10-CM | POA: Insufficient documentation

## 2013-05-21 DIAGNOSIS — I447 Left bundle-branch block, unspecified: Secondary | ICD-10-CM

## 2013-05-21 DIAGNOSIS — I509 Heart failure, unspecified: Secondary | ICD-10-CM | POA: Insufficient documentation

## 2013-05-21 DIAGNOSIS — I255 Ischemic cardiomyopathy: Secondary | ICD-10-CM

## 2013-05-21 DIAGNOSIS — I059 Rheumatic mitral valve disease, unspecified: Secondary | ICD-10-CM | POA: Insufficient documentation

## 2013-05-21 DIAGNOSIS — I5042 Chronic combined systolic (congestive) and diastolic (congestive) heart failure: Secondary | ICD-10-CM

## 2013-05-21 DIAGNOSIS — R0609 Other forms of dyspnea: Secondary | ICD-10-CM | POA: Insufficient documentation

## 2013-05-21 DIAGNOSIS — Z950 Presence of cardiac pacemaker: Secondary | ICD-10-CM | POA: Insufficient documentation

## 2013-05-21 DIAGNOSIS — I379 Nonrheumatic pulmonary valve disorder, unspecified: Secondary | ICD-10-CM | POA: Insufficient documentation

## 2013-05-21 DIAGNOSIS — R0602 Shortness of breath: Secondary | ICD-10-CM

## 2013-05-21 DIAGNOSIS — I951 Orthostatic hypotension: Secondary | ICD-10-CM | POA: Insufficient documentation

## 2013-05-21 DIAGNOSIS — I480 Paroxysmal atrial fibrillation: Secondary | ICD-10-CM

## 2013-05-21 DIAGNOSIS — I252 Old myocardial infarction: Secondary | ICD-10-CM | POA: Insufficient documentation

## 2013-05-21 DIAGNOSIS — E785 Hyperlipidemia, unspecified: Secondary | ICD-10-CM | POA: Insufficient documentation

## 2013-05-21 DIAGNOSIS — R0989 Other specified symptoms and signs involving the circulatory and respiratory systems: Secondary | ICD-10-CM | POA: Insufficient documentation

## 2013-05-21 DIAGNOSIS — I359 Nonrheumatic aortic valve disorder, unspecified: Secondary | ICD-10-CM | POA: Insufficient documentation

## 2013-05-21 DIAGNOSIS — I10 Essential (primary) hypertension: Secondary | ICD-10-CM | POA: Insufficient documentation

## 2013-05-21 LAB — MDC_IDC_ENUM_SESS_TYPE_INCLINIC
Battery Voltage: 2.98 V
Brady Statistic RV Percent Paced: 98 %
Lead Channel Pacing Threshold Amplitude: 0.75 V
Lead Channel Pacing Threshold Amplitude: 1 V
Lead Channel Pacing Threshold Amplitude: 1 V
Lead Channel Pacing Threshold Pulse Width: 0.4 ms
Lead Channel Pacing Threshold Pulse Width: 0.4 ms
Lead Channel Pacing Threshold Pulse Width: 0.4 ms
Lead Channel Sensing Intrinsic Amplitude: 2.5 mV
Lead Channel Setting Pacing Amplitude: 2 V
Lead Channel Setting Pacing Amplitude: 2 V
Lead Channel Setting Pacing Pulse Width: 0.4 ms
Lead Channel Setting Pacing Pulse Width: 0.4 ms
MDC IDC MSMT BATTERY REMAINING LONGEVITY: 91.2 mo
MDC IDC MSMT LEADCHNL LV IMPEDANCE VALUE: 762.5 Ohm
MDC IDC MSMT LEADCHNL RA IMPEDANCE VALUE: 487.5 Ohm
MDC IDC MSMT LEADCHNL RV IMPEDANCE VALUE: 537.5 Ohm
MDC IDC MSMT LEADCHNL RV SENSING INTR AMPL: 12 mV
MDC IDC PG MODEL: 3242
MDC IDC PG SERIAL: 7516165
MDC IDC SESS DTM: 20150209164244
MDC IDC SET LEADCHNL RV PACING AMPLITUDE: 2.5 V
MDC IDC SET LEADCHNL RV SENSING SENSITIVITY: 2 mV
MDC IDC STAT BRADY RA PERCENT PACED: 3.3 %

## 2013-05-21 NOTE — Assessment & Plan Note (Signed)
No intercurrent afib 

## 2013-05-21 NOTE — Assessment & Plan Note (Signed)
Euvolemic. 

## 2013-05-21 NOTE — Progress Notes (Signed)
Patient Care Team: Lisabeth Pick, MD as PCP - General   HPI  [name] [name] is a 80 y.o. female Seen in followup for syncope whic orthostatic. She is to see the autonomic neurologist [facility] next week    She does however have a history of ischemic heart disease with prior MI and bypass surgery prior PCI to the LAD and previously normal left ventricular function. However, on evaluation 4/14 echocardiogram demonstrated EF 25-30%. LHC 4/14 Proximal LAD stent patent, LIMA-LAD atretic, D1 occluded, proximal circumflex 30%, mid RCA occluded, SVG-D1 normal, SVG-OM2 normal, SVG-distal RCA normal, EF 40% with diffuse HK   She has a history of atrial fibrillation that persisted for some years after her bypass surgery   It has been asymptomatic since then and her Coumadin was discontinued . Per chart as TIA listed in the problem list; according to her daughter this was a presumptive diagnosis and was never confirmed. MRI was negative    When she was last seen in October, she was having significant problems with orthostatic dizziness and was found to have Mobitz 2 heart block upon standing. She underwent CRT P. insertion by Dr. Elliot Cousin  She is modestly better. He however noted that when she went to see her for a prolonged stay was associated with presyncope.  She has tried support stockings of unclear pressure.  She brings in blood pressure records from Home that  demonstrates variable orthostatic intolerance  Past Medical History  Diagnosis Date  . MELANOMA 09/06/2008  . HYPERLIPIDEMIA 10/06/2007  . HYPERTENSION 10/06/2007  . MYOCARDIAL INFARCTION, HX OF 10/06/2007  . CORONARY ARTERY DISEASE 10/06/2007    a. s/p PCI to LAD; b. s/p CABG; c. LHC 07/13/12: Proximal LAD stent patent, LIMA-LAD atretic, D1 occluded, proximal circumflex 30%, mid RCA occluded, SVG-D1 normal, SVG-OM2 normal, SVG-distal RCA normal, EF 40% with diffuse HK  . LBBB 09/06/2008  . Atrial fibrillation 10/06/2007    post op    . VARICOSE VEIN 09/29/2009  . UTERINE CANCER, HX OF 09/06/2008  . PERSONAL HX COLONIC POLYPS 10/30/2009  . Syncope   . Dizziness   . NICM (nonischemic cardiomyopathy)     Echocardiogram 07/12/12: EF 25-30%, diffuse HK, mild AI, mild MR, mild LAE  . COPD (chronic obstructive pulmonary disease)     Emphysema on 02/03/13 CXR    Past Surgical History  Procedure Laterality Date  . Oophorectomy    . Right distal pretibeal      melanoma  . Coronary angioplasty with stent placement  2009  . Coronary artery bypass graft  2004  . Abdominal hysterectomy  1988  . Cardiac catheterization  08/30/2008  . Bi-ventricular pacemaker insertion (crt-p)  02/02/2013    St. Jude, serial no. #[id number]     Current Outpatient Prescriptions  Medication Sig Dispense Refill  . carvedilol (COREG) 3.125 MG tablet Take 1 tablet (3.125 mg total) by mouth 2 (two) times daily with a meal.  60 tablet  3  . clopidogrel (PLAVIX) 75 MG tablet Take 75 mg by mouth every evening.      . fluticasone (FLONASE) 50 MCG/ACT nasal spray Place 2 sprays into both nostrils daily.  16 g  6  . nitroGLYCERIN (NITROLINGUAL) 0.4 MG/SPRAY spray Place 1 spray under the tongue every 5 (five) minutes as needed for chest pain.       No current facility-administered medications for this visit.    Allergies  Allergen Reactions  . Nitroglycerin Other (See Comments)  sensitive to tabs which causes rapid drop in blood pressure. Is ok to use oral spray form    Review of Systems negative except from HPI and PMH  Physical Exam BP 156/85  Pulse 76  Ht 5\' 7"  (1.702 m)  Wt 145 lb (65.772 kg)  BMI 22.71 kg/m2 Well developed and nourished in no acute distress HENT normal Neck supple with JVP-flat Clear Device pocket well healed; without hematoma or erythema.  There is no tethering  Regular rate and rhythm, no murmurs or gallops Abd-soft with active BS No Clubbing cyanosis edema Skin-warm and dry A & Oriented  Grossly normal sensory and  motor function  ECG demonstrates P. synchronous pacing  Assessment and  Plan

## 2013-05-21 NOTE — Patient Instructions (Signed)
Your physician recommends that you continue on your current medications as directed. Please refer to the Current Medication list given to you today.  Remote monitoring is used to monitor your Pacemaker of ICD from home. This monitoring reduces the number of office visits required to check your device to one time per year. It allows [place] to keep an eye on the functioning of your device to ensure it is working properly. You are scheduled for a device check from home on 08/22/2013. You may send your transmission at any time that day. If you have a wireless device, the transmission will be sent automatically. After your physician reviews your transmission, you will receive a postcard with your next transmission date.  Your physician wants you to follow-up in: October 2015 with Dr. Caryl Comes.  You will receive a reminder letter in the mail two months in advance. If you don't receive a letter, please call our office to schedule the follow-up appointment.

## 2013-05-21 NOTE — Assessment & Plan Note (Signed)
The patient's device was interrogated and the information was fully reviewed.  The device was reprogrammed to maximize longevity  

## 2013-05-21 NOTE — Assessment & Plan Note (Signed)
Progressive complete heart block. The device dependent

## 2013-05-21 NOTE — Progress Notes (Signed)
Echocardiogram performed.  

## 2013-05-22 ENCOUNTER — Other Ambulatory Visit (HOSPITAL_COMMUNITY): Payer: Self-pay | Admitting: Physician Assistant

## 2013-05-23 ENCOUNTER — Telehealth: Payer: Self-pay | Admitting: Internal Medicine

## 2013-05-23 NOTE — Telephone Encounter (Signed)
New message    Stopped statin drug and muscle ache/pain went away.  Need to start a new statin?  If yes, pls call it in to [facility] Want to start mestinon---will not wear support stockings.  When pt lies down the bp is high/ low when she stands up.  Should she take this medication only during the day?

## 2013-05-23 NOTE — Telephone Encounter (Signed)
Informed pt dtr that I would discuss with Dr. Caryl Comes tomorrow when he is back in office. They are agreeable to plan.

## 2013-05-24 ENCOUNTER — Other Ambulatory Visit: Payer: Self-pay | Admitting: *Deleted

## 2013-05-24 MED ORDER — PYRIDOSTIGMINE BROMIDE 60 MG PO TABS: 30.0000 mg | ORAL_TABLET | Freq: Two times a day (BID) | ORAL | Status: DC

## 2013-05-24 MED ORDER — CARVEDILOL 6.25 MG PO TABS: 6.2500 mg | ORAL_TABLET | Freq: Two times a day (BID) | ORAL | Status: DC

## 2013-05-24 NOTE — Telephone Encounter (Signed)
Dr. Caryl Comes called and spoke with pt's dtr, [name]. Per their discussion Mestinon 30 mg BID ordered, and increase Coreg to 6.75 mg BID. Prescription sent to [facility] per their request.

## 2013-05-24 NOTE — Telephone Encounter (Signed)
Follow Up    Pt daughter called to follow Up on medication inquiry.. Please call

## 2013-06-21 ENCOUNTER — Encounter: Payer: Self-pay | Admitting: Internal Medicine

## 2013-07-24 ENCOUNTER — Telehealth: Payer: Self-pay | Admitting: Internal Medicine

## 2013-07-24 NOTE — Telephone Encounter (Signed)
Dr Leanne Chang is not in the office today.  Sent to Dr Caryl Comes for approval

## 2013-07-24 NOTE — Telephone Encounter (Signed)
Pt called dentist and she will get from him. Disregard request

## 2013-07-24 NOTE — Telephone Encounter (Signed)
Pt is having dental clean today 2pm and needs a refill on amoxicillin 500 mg capsules send to [facility]. Pt needs abx due heart problems. Pt is aware may take up to 3 business days.

## 2013-07-24 NOTE — Telephone Encounter (Deleted)
Pt is having dental cleaning today 2pm and needs a refill on amoxicillin 500 mg capsules send to [facility]. Pt needs abx due heart problems. Pt is aware may take up to 3 business days.

## 2013-07-30 ENCOUNTER — Telehealth: Payer: Self-pay | Admitting: Internal Medicine

## 2013-07-30 NOTE — Telephone Encounter (Signed)
New message     Need refill on pyridostigmine.  She needs a new presc because of the dosage change.  The new presc should be 60mg --45 tablets--take 1/2 tab three times a day.  Please call in to [facility].  Old presc says twice a day and pt cannot get it refilled because it is too early.  Also, will Dr Caryl Comes consider stopping her cholesterol medication. She now take it 1 tab on mon, wed and fri.  She is having muscle ache on this 3 day a week rx and she is also having confusion problems.  However, pt has had a stent and do not know if she needs to be on it because of the stent.

## 2013-07-30 NOTE — Telephone Encounter (Signed)
Pt's son-in-law explains that pt had appt with autonomic neurologist at [facility] who increased Pyridostigmine to TID. I informed him that Dr. Caryl Comes is out of the office until the end of the month, and advised they call [facility] doctor to have them handle updating prescription. Asked to call us back if there were any issues. I will attempt to get records from them. I explained that I would have Dr. Caryl Comes address cholesterol medication when he returns at the end of the month. Pt's son-in-law agreeable to plan.

## 2013-07-31 ENCOUNTER — Other Ambulatory Visit: Payer: Self-pay | Admitting: *Deleted

## 2013-07-31 MED ORDER — PYRIDOSTIGMINE BROMIDE 60 MG PO TABS: 30.0000 mg | ORAL_TABLET | Freq: Three times a day (TID) | ORAL | Status: DC

## 2013-08-08 ENCOUNTER — Other Ambulatory Visit: Payer: Self-pay | Admitting: *Deleted

## 2013-08-08 MED ORDER — PYRIDOSTIGMINE BROMIDE 60 MG PO TABS: 30.0000 mg | ORAL_TABLET | Freq: Three times a day (TID) | ORAL | Status: DC

## 2013-08-09 NOTE — Telephone Encounter (Signed)
Dr. Caryl Comes spoke with pt's dtr and son-in-law yesterday afternoon (4/29). Ok to stop statin medication.  Mestinon rx sent to [facility]. for increased dosage 60 mg TID. They were both thankful for Dr. Caryl Comes getting back with them and discussing medications and pt's progress.

## 2013-08-17 ENCOUNTER — Ambulatory Visit (INDEPENDENT_AMBULATORY_CARE_PROVIDER_SITE_OTHER)
Admission: RE | Admit: 2013-08-17 | Discharge: 2013-08-17 | Disposition: A | Discharge status: Home / Self Care | Payer: Medicare Other | Source: Ambulatory Visit | Attending: Physician Assistant | Admitting: Physician Assistant

## 2013-08-17 ENCOUNTER — Ambulatory Visit (INDEPENDENT_AMBULATORY_CARE_PROVIDER_SITE_OTHER): Payer: Medicare Other | Admitting: Physician Assistant

## 2013-08-17 ENCOUNTER — Encounter: Payer: Self-pay | Admitting: Physician Assistant

## 2013-08-17 VITALS — BP 128/68 | HR 70 | Temp 98.4°F | Resp 18 | Ht 67.0 in | Wt 141.0 lb

## 2013-08-17 DIAGNOSIS — R05 Cough: Secondary | ICD-10-CM

## 2013-08-17 DIAGNOSIS — J209 Acute bronchitis, unspecified: Secondary | ICD-10-CM

## 2013-08-17 DIAGNOSIS — J0100 Acute maxillary sinusitis, unspecified: Secondary | ICD-10-CM

## 2013-08-17 DIAGNOSIS — R059 Cough, unspecified: Secondary | ICD-10-CM

## 2013-08-17 MED ORDER — LEVOFLOXACIN 500 MG PO TABS: 500.0000 mg | ORAL_TABLET | Freq: Every day | ORAL | Status: DC

## 2013-08-17 NOTE — Progress Notes (Signed)
Pre visit review using our clinic review tool, if applicable. No additional management support is needed unless otherwise documented below in the visit note. 

## 2013-08-17 NOTE — Progress Notes (Signed)
Subjective:    Patient ID: [name] [name], female    DOB: 1932-02-03, 80 y.o.   MRN: [id number]  Cough This is a new problem. The current episode started 1 to 4 weeks ago. The problem has been gradually worsening. The problem occurs every few minutes. The cough is productive of sputum. Associated symptoms include nasal congestion and postnasal drip. Pertinent negatives include no chest pain, chills, ear congestion, ear pain, fever, headaches, heartburn, hemoptysis, myalgias, rash, rhinorrhea, sore throat, shortness of breath, sweats, weight loss or wheezing. The symptoms are aggravated by pollens. Treatments tried: Flonase. The treatment provided no relief.     Review of Systems  Constitutional: Negative for fever, chills, weight loss and unexpected weight change.  HENT: Positive for congestion, postnasal drip and sinus pressure. Negative for ear pain, rhinorrhea and sore throat.   Respiratory: Positive for cough. Negative for hemoptysis, shortness of breath and wheezing.   Cardiovascular: Negative for chest pain.  Gastrointestinal: Negative for heartburn, nausea, vomiting and diarrhea.  Musculoskeletal: Negative for myalgias.  Skin: Negative for rash.  Neurological: Negative for headaches.  All other systems reviewed and are negative.  Past Medical History  Diagnosis Date  . MELANOMA 09/06/2008  . HYPERLIPIDEMIA 10/06/2007  . HYPERTENSION 10/06/2007  . MYOCARDIAL INFARCTION, HX OF 10/06/2007  . CORONARY ARTERY DISEASE 10/06/2007    a. s/p PCI to LAD; b. s/p CABG; c. LHC 07/13/12: Proximal LAD stent patent, LIMA-LAD atretic, D1 occluded, proximal circumflex 30%, mid RCA occluded, SVG-D1 normal, SVG-OM2 normal, SVG-distal RCA normal, EF 40% with diffuse HK  . LBBB 09/06/2008  . Atrial fibrillation 10/06/2007    post op  . VARICOSE VEIN 09/29/2009  . UTERINE CANCER, HX OF 09/06/2008  . PERSONAL HX COLONIC POLYPS 10/30/2009  . Syncope   . Dizziness   . NICM (nonischemic cardiomyopathy)    Echocardiogram 07/12/12: EF 25-30%, diffuse HK, mild AI, mild MR, mild LAE  . COPD (chronic obstructive pulmonary disease)     Emphysema on 02/03/13 CXR   Past Surgical History  Procedure Laterality Date  . Oophorectomy    . Right distal pretibeal      melanoma  . Coronary angioplasty with stent placement  2009  . Coronary artery bypass graft  2004  . Abdominal hysterectomy  1988  . Cardiac catheterization  08/30/2008  . Bi-ventricular pacemaker insertion (crt-p)  02/02/2013    St. Jude, serial no. #[id number]     reports that she has never smoked. She has never used smokeless tobacco. She reports that she drinks alcohol. She reports that she does not use illicit drugs. family history includes Heart attack in her father; Stroke in her mother. There is no history of Colon cancer. Allergies  Allergen Reactions  . Nitroglycerin Other (See Comments)    sensitive to tabs which causes rapid drop in blood pressure. Is ok to use oral spray form       Objective:   Physical Exam  Nursing note and vitals reviewed. Constitutional: She is oriented to person, place, and time. She appears well-developed and well-nourished. No distress.  HENT:  Head: Normocephalic and atraumatic.  Right Ear: External ear normal.  Left Ear: External ear normal.  Nose: Nose normal.  Mouth/Throat: No oropharyngeal exudate.  Oropharynx slightly erythematous, no exudate.  Bilateral tympanic membranes are normal in appearance.  Left maxillary sinus exquisitely tender to palpation, right maxillary sinus nontender to palpation. Bilateral frontal sinuses nontender to palpation.  Eyes: Conjunctivae and EOM are normal. Pupils are equal, round, and  reactive to light.  Neck: Normal range of motion. Neck supple. No JVD present. No tracheal deviation present.  Cardiovascular: Normal rate, regular rhythm, normal heart sounds and intact distal pulses.  Exam reveals no gallop and no friction rub.   No murmur  heard. Pulmonary/Chest: Effort normal. No stridor. No respiratory distress. She has no wheezes. She has no rales. She exhibits no tenderness.  Rhonchi heard in the bilateral lung bases  No chest wall pain.  Musculoskeletal: Normal range of motion.  Lymphadenopathy:    She has no cervical adenopathy.  Neurological: She is alert and oriented to person, place, and time.  Skin: Skin is warm and dry. No rash noted. She is not diaphoretic. No erythema. No pallor.  Psychiatric: She has a normal mood and affect. Her behavior is normal. Judgment and thought content normal.   Filed Vitals:   08/17/13 1008  BP: 128/68  Pulse: 70  Temp: 98.4 F (36.9 C)  Resp: 18    Lab Results  Component Value Date   WBC 4.4 02/02/2013   HGB 13.6 02/02/2013   HCT 40.1 02/02/2013   PLT 120* 02/02/2013   GLUCOSE 104* 02/02/2013   CHOL 145 07/12/2012   TRIG 76 07/12/2012   HDL 52 07/12/2012   LDLCALC 78 07/12/2012   ALT 14 03/07/2013   AST 18 03/07/2013   NA 137 02/02/2013   K 4.0 02/02/2013   CL 99 02/02/2013   CREATININE 0.78 02/02/2013   BUN 10 02/02/2013   CO2 28 02/02/2013   TSH 0.45 08/23/2012   INR 1.06 02/02/2013   HGBA1C 5.7* 07/11/2012      Assessment & Plan:  Lamar was seen today for sinusitis and cough.  Diagnoses and associated orders for this visit:  Sinusitis, acute maxillary - levofloxacin (LEVAQUIN) 500 MG tablet; Take 1 tablet (500 mg total) by mouth daily.  Acute bronchitis - levofloxacin (LEVAQUIN) 500 MG tablet; Take 1 tablet (500 mg total) by mouth daily.  Cough - Cough for 3 to 4 weeks, worsening, now with sputum. - DG Chest 2 View; Future    Patient Instructions  Levaquin once a day for 10 days for sinus infection and bronchitis.  You will have a chest x-ray done today at the [place] office. We will call with the results of this when they are available.  Followup to clinic in one week to reassess, and sooner if symptoms worsen or fail to improve despite treatment.

## 2013-08-17 NOTE — Patient Instructions (Addendum)
Levaquin once a day for 10 days for sinus infection and bronchitis.  You will have a chest x-ray done today at the [place] office. We will call with the results of this when they are available.  Followup to clinic in one week to reassess, and sooner if symptoms worsen or fail to improve despite treatment.  Sinusitis Sinusitis is redness, soreness, and puffiness (inflammation) of the air pockets in the bones of your face (sinuses). The redness, soreness, and puffiness can cause air and mucus to get trapped in your sinuses. This can allow germs to grow and cause an infection.  HOME CARE   Drink enough fluids to keep your pee (urine) clear or pale yellow.  Use a humidifier in your home.  Run a hot shower to create steam in the bathroom. Sit in the bathroom with the door closed. Breathe in the steam 3 4 times a day.  Put a warm, moist washcloth on your face 3 4 times a day, or as told by your doctor.  Use salt water sprays (saline sprays) to wet the thick fluid in your nose. This can help the sinuses drain.  Only take medicine as told by your doctor. GET HELP RIGHT AWAY IF:   Your pain gets worse.  You have very bad headaches.  You are sick to your stomach (nauseous).  You throw up (vomit).  You are very sleepy (drowsy) all the time.  Your face is puffy (swollen).  Your vision changes.  You have a stiff neck.  You have trouble breathing. MAKE SURE YOU:   Understand these instructions.  Will watch your condition.  Will get help right away if you are not doing well or get worse. Document Released: 09/15/2007 Document Revised: 12/22/2011 Document Reviewed: 11/02/2011 [facility] Patient Information 2014 [place].

## 2013-08-21 ENCOUNTER — Encounter: Payer: Self-pay | Admitting: Physician Assistant

## 2013-08-21 ENCOUNTER — Ambulatory Visit (INDEPENDENT_AMBULATORY_CARE_PROVIDER_SITE_OTHER): Payer: Medicare Other | Admitting: Physician Assistant

## 2013-08-21 VITALS — BP 124/60 | HR 85 | Temp 98.2°F | Resp 18 | Ht 67.0 in | Wt 139.4 lb

## 2013-08-21 DIAGNOSIS — Z09 Encounter for follow-up examination after completed treatment for conditions other than malignant neoplasm: Secondary | ICD-10-CM

## 2013-08-21 DIAGNOSIS — J329 Chronic sinusitis, unspecified: Secondary | ICD-10-CM

## 2013-08-21 NOTE — Progress Notes (Signed)
Subjective:    Patient ID: [name] [name], female    DOB: [birth year], 80 y.o.   MRN: [id number]  HPI Patient is a 80 year old Caucasian female presenting to the clinic for followup of sinusitis. Patient was seen in the clinic 5 days ago and diagnosed with sinusitis and prescribed Levaquin for 10 days. At that time patient was also complaining about Bronchitis with productive cough with increased sputum production, a chest x-ray was obtained that day which showed no cardiopulmonary disease. Patient states that after the visit, she and her family drove to [place] for her daughter's graduation, and drove back the same night. She feels like this "took a lot out of" her. She is worried that her antibiotic is not working because she has increased postnasal drainage. She still has a cough with phlegm production, however sputum is clear. She denies fevers, chills, nausea, vomiting, diarrhea, shortness of breath, and chest pain.   Review of Systems   as per the history of present illness and are otherwise negative.  Past Medical History  Diagnosis Date  . MELANOMA 09/06/2008  . HYPERLIPIDEMIA 10/06/2007  . HYPERTENSION 10/06/2007  . MYOCARDIAL INFARCTION, HX OF 10/06/2007  . CORONARY ARTERY DISEASE 10/06/2007    a. s/p PCI to LAD; b. s/p CABG; c. LHC 07/13/12: Proximal LAD stent patent, LIMA-LAD atretic, D1 occluded, proximal circumflex 30%, mid RCA occluded, SVG-D1 normal, SVG-OM2 normal, SVG-distal RCA normal, EF 40% with diffuse HK  . LBBB 09/06/2008  . Atrial fibrillation 10/06/2007    post op  . VARICOSE VEIN 09/29/2009  . UTERINE CANCER, HX OF 09/06/2008  . PERSONAL HX COLONIC POLYPS 10/30/2009  . Syncope   . Dizziness   . NICM (nonischemic cardiomyopathy)     Echocardiogram 07/12/12: EF 25-30%, diffuse HK, mild AI, mild MR, mild LAE  . COPD (chronic obstructive pulmonary disease)     Emphysema on 02/03/13 CXR   Past Surgical History  Procedure Laterality Date  . Oophorectomy    .  Right distal pretibeal      melanoma  . Coronary angioplasty with stent placement  2009  . Coronary artery bypass graft  2004  . Abdominal hysterectomy  1988  . Cardiac catheterization  08/30/2008  . Bi-ventricular pacemaker insertion (crt-p)  02/02/2013    St. Jude, serial no. #[id number]     reports that she has never smoked. She has never used smokeless tobacco. She reports that she drinks alcohol. She reports that she does not use illicit drugs. family history includes Heart attack in her father; Stroke in her mother. There is no history of Colon cancer. Allergies  Allergen Reactions  . Nitroglycerin Other (See Comments)    sensitive to tabs which causes rapid drop in blood pressure. Is ok to use oral spray form        Objective:   Physical Exam  Nursing note and vitals reviewed. Constitutional: She is oriented to person, place, and time. She appears well-developed and well-nourished. No distress.  HENT:  Head: Normocephalic and atraumatic.  Right Ear: External ear normal.  Left Ear: External ear normal.  Nose: Nose normal.  Oropharynx slightly erythematous, no exudate.  Bilateral tympanic membranes are normal in appearance.  Bilateral maxillary sinuses slightly tender to palpation. Bilateral frontal sinuses nontender to palpation.  Eyes: Conjunctivae and EOM are normal. Pupils are equal, round, and reactive to light.  Neck: Normal range of motion. Neck supple. No JVD present.  Cardiovascular: Normal rate, regular rhythm, normal heart sounds and intact distal  pulses.  Exam reveals no gallop and no friction rub.   No murmur heard. Pulmonary/Chest: Effort normal and breath sounds normal. No stridor. No respiratory distress. She has no wheezes. She has no rales. She exhibits no tenderness.  No rhonchi in lung bases.  Musculoskeletal: Normal range of motion.  Lymphadenopathy:    She has no cervical adenopathy.  Neurological: She is alert and oriented to person, place, and time.   Skin: Skin is warm and dry. No rash noted. She is not diaphoretic. No erythema. No pallor.  Psychiatric: She has a normal mood and affect. Her behavior is normal. Judgment and thought content normal.   Filed Vitals:   08/21/13 1341  BP: 124/60  Pulse: 85  Temp: 98.2 F (36.8 C)  Resp: 18   Lab Results  Component Value Date   WBC 4.4 02/02/2013   HGB 13.6 02/02/2013   HCT 40.1 02/02/2013   PLT 120* 02/02/2013   GLUCOSE 104* 02/02/2013   CHOL 145 07/12/2012   TRIG 76 07/12/2012   HDL 52 07/12/2012   LDLCALC 78 07/12/2012   ALT 14 03/07/2013   AST 18 03/07/2013   NA 137 02/02/2013   K 4.0 02/02/2013   CL 99 02/02/2013   CREATININE 0.78 02/02/2013   BUN 10 02/02/2013   CO2 28 02/02/2013   TSH 0.45 08/23/2012   INR 1.06 02/02/2013   HGBA1C 5.7* 07/11/2012      Assessment & Plan:  [name] [name] was seen today for follow up of sinusitis.  Diagnoses and associated orders for this visit:  Follow up - Bronchitis appears to have improved. No longer hearing rhonchi on lung exam.  Sinusitis - Improving. Will Continue Levaquin until course is completed. Will add over the counter mucinex, and will have pt start using her Flonase daily to see if these also improve her symptoms.  Plan to follow up in one week to reassess.    Patient Instructions  Continue Levaquin daily until you've completed the whole course of antibiotics.  Plain Over the Counter Mucinex (NOT Mucinex D) for thick secretions  Force NON dairy fluids, drinking plenty of water is best.    Over the Counter Flonase 1 spray in each nostril twice a day as needed. Use the "crossover" technique into opposite nostril spraying toward opposite ear @ 45 degree angle, not straight up into nostril.   Saline Irrigation and Saline Sprays can also help reduce symptoms.  Followup in one week, or sooner if symptoms worsen or fail to improve despite treatment.

## 2013-08-21 NOTE — Patient Instructions (Signed)
Continue Levaquin daily until you've completed the whole course of antibiotics.  Plain Over the Counter Mucinex (NOT Mucinex D) for thick secretions  Force NON dairy fluids, drinking plenty of water is best.    Over the Counter Flonase 1 spray in each nostril twice a day as needed. Use the "crossover" technique into opposite nostril spraying toward opposite ear @ 45 degree angle, not straight up into nostril.   Saline Irrigation and Saline Sprays can also help reduce symptoms.  Followup in one week, or sooner if symptoms worsen or fail to improve despite treatment.  Sinusitis Sinusitis is redness, soreness, and puffiness (inflammation) of the air pockets in the bones of your face (sinuses). The redness, soreness, and puffiness can cause air and mucus to get trapped in your sinuses. This can allow germs to grow and cause an infection.  HOME CARE   Drink enough fluids to keep your pee (urine) clear or pale yellow.  Use a humidifier in your home.  Run a hot shower to create steam in the bathroom. Sit in the bathroom with the door closed. Breathe in the steam 3 4 times a day.  Put a warm, moist washcloth on your face 3 4 times a day, or as told by your doctor.  Use salt water sprays (saline sprays) to wet the thick fluid in your nose. This can help the sinuses drain.  Only take medicine as told by your doctor. GET HELP RIGHT AWAY IF:   Your pain gets worse.  You have very bad headaches.  You are sick to your stomach (nauseous).  You throw up (vomit).  You are very sleepy (drowsy) all the time.  Your face is puffy (swollen).  Your vision changes.  You have a stiff neck.  You have trouble breathing. MAKE SURE YOU:   Understand these instructions.  Will watch your condition.  Will get help right away if you are not doing well or get worse. Document Released: 09/15/2007 Document Revised: 12/22/2011 Document Reviewed: 11/02/2011 [facility] Patient Information 2014  [place].

## 2013-08-21 NOTE — Progress Notes (Signed)
Pre visit review using our clinic review tool, if applicable. No additional management support is needed unless otherwise documented below in the visit note. 

## 2013-08-22 ENCOUNTER — Encounter: Payer: Medicare Other | Admitting: *Deleted

## 2013-08-24 ENCOUNTER — Ambulatory Visit: Payer: Medicare Other | Admitting: Physician Assistant

## 2013-08-28 ENCOUNTER — Encounter: Payer: Self-pay | Admitting: Physician Assistant

## 2013-08-28 ENCOUNTER — Ambulatory Visit (INDEPENDENT_AMBULATORY_CARE_PROVIDER_SITE_OTHER): Payer: Medicare Other | Admitting: Physician Assistant

## 2013-08-28 ENCOUNTER — Telehealth: Payer: Self-pay | Admitting: Physician Assistant

## 2013-08-28 VITALS — BP 124/68 | HR 75 | Temp 97.8°F | Resp 20 | Wt 138.0 lb

## 2013-08-28 DIAGNOSIS — J449 Chronic obstructive pulmonary disease, unspecified: Secondary | ICD-10-CM

## 2013-08-28 DIAGNOSIS — R05 Cough: Secondary | ICD-10-CM

## 2013-08-28 DIAGNOSIS — Z09 Encounter for follow-up examination after completed treatment for conditions other than malignant neoplasm: Secondary | ICD-10-CM

## 2013-08-28 DIAGNOSIS — J329 Chronic sinusitis, unspecified: Secondary | ICD-10-CM

## 2013-08-28 DIAGNOSIS — R059 Cough, unspecified: Secondary | ICD-10-CM

## 2013-08-28 LAB — CBC WITH DIFFERENTIAL/PLATELET
BASOS ABS: 0 10*3/uL (ref 0.0–0.1)
Basophils Relative: 0.6 % (ref 0.0–3.0)
EOS ABS: 0.1 10*3/uL (ref 0.0–0.7)
Eosinophils Relative: 2.7 % (ref 0.0–5.0)
HCT: 38 % (ref 36.0–46.0)
Hemoglobin: 12.7 g/dL (ref 12.0–15.0)
LYMPHS PCT: 25.4 % (ref 12.0–46.0)
Lymphs Abs: 1.2 10*3/uL (ref 0.7–4.0)
MCHC: 33.5 g/dL (ref 30.0–36.0)
MCV: 88.3 fl (ref 78.0–100.0)
MONOS PCT: 8.1 % (ref 3.0–12.0)
Monocytes Absolute: 0.4 10*3/uL (ref 0.1–1.0)
NEUTROS PCT: 63.2 % (ref 43.0–77.0)
Neutro Abs: 2.9 10*3/uL (ref 1.4–7.7)
PLATELETS: 137 10*3/uL — AB (ref 150.0–400.0)
RBC: 4.3 Mil/uL (ref 3.87–5.11)
RDW: 14.5 % (ref 11.5–15.5)
WBC: 4.6 10*3/uL (ref 4.0–10.5)

## 2013-08-28 LAB — BASIC METABOLIC PANEL
BUN: 12 mg/dL (ref 6–23)
CHLORIDE: 100 meq/L (ref 96–112)
CO2: 30 meq/L (ref 19–32)
Calcium: 8.7 mg/dL (ref 8.4–10.5)
Creatinine, Ser: 0.7 mg/dL (ref 0.4–1.2)
GFR: 81.56 mL/min (ref >60.00)
Glucose, Bld: 85 mg/dL (ref 70–99)
Potassium: 3.8 mEq/L (ref 3.5–5.1)
SODIUM: 135 meq/L (ref 135–145)

## 2013-08-28 NOTE — Progress Notes (Signed)
Pre visit review using our clinic review tool, if applicable. No additional management support is needed unless otherwise documented below in the visit note. 

## 2013-08-28 NOTE — Progress Notes (Signed)
Subjective:    Patient ID: [name] [name], female    DOB: 01/06/1932, 80 y.o.   MRN: [id number]  HPI Patient is an 80 year old Caucasian female presenting to the clinic for followup of sinusitis. Patient was seen 2 weeks ago diagnosed with sinusitis and prescribed antibiotics. She completed the course last week. She states that she feels as though her sinus symptoms have improved greatly. She is no longer experiencing sinus pain.  The patient's also complaining of a cough. The patient says that her cough has been persistent the past 3 weeks despite treatment with antibiotics for sinusitis. The patient has also been trying Mucinex, however has not been using it as much the past few days as it caused her to cough too much during the night. Patient is complaining of chest congestion with some yellow mucus production with cough. No chest wall tenderness. The patient has a diagnosis of COPD, though she is not on treatment currently for COPD and has never been evaluated by a pulmonologist. No previous pulmonary function test on file. She denies fevers, chills, nausea, vomiting, diarrhea, and shortness of breath.   Review of Systems As per the history of present illness and are otherwise negative.   Past Medical History  Diagnosis Date  . MELANOMA 09/06/2008  . HYPERLIPIDEMIA 10/06/2007  . HYPERTENSION 10/06/2007  . MYOCARDIAL INFARCTION, HX OF 10/06/2007  . CORONARY ARTERY DISEASE 10/06/2007    a. s/p PCI to LAD; b. s/p CABG; c. LHC 07/13/12: Proximal LAD stent patent, LIMA-LAD atretic, D1 occluded, proximal circumflex 30%, mid RCA occluded, SVG-D1 normal, SVG-OM2 normal, SVG-distal RCA normal, EF 40% with diffuse HK  . LBBB 09/06/2008  . Atrial fibrillation 10/06/2007    post op  . VARICOSE VEIN 09/29/2009  . UTERINE CANCER, HX OF 09/06/2008  . PERSONAL HX COLONIC POLYPS 10/30/2009  . Syncope   . Dizziness   . NICM (nonischemic cardiomyopathy)     Echocardiogram 07/12/12: EF 25-30%, diffuse HK, mild AI,  mild MR, mild LAE  . COPD (chronic obstructive pulmonary disease)     Emphysema on 02/03/13 CXR   Past Surgical History  Procedure Laterality Date  . Oophorectomy    . Right distal pretibeal      melanoma  . Coronary angioplasty with stent placement  2009  . Coronary artery bypass graft  2004  . Abdominal hysterectomy  1988  . Cardiac catheterization  08/30/2008  . Bi-ventricular pacemaker insertion (crt-p)  02/02/2013    [employer]. Jude, serial no. #[id number]     reports that she has never smoked. She has never used smokeless tobacco. She reports that she drinks alcohol. She reports that she does not use illicit drugs. family history includes Heart attack in her father; Stroke in her mother. There is no history of Colon cancer. Allergies  Allergen Reactions  . Nitroglycerin Other (See Comments)    sensitive to tabs which causes rapid drop in blood pressure. Is ok to use oral spray form       Objective:   Physical Exam  Nursing note and vitals reviewed. Constitutional: She is oriented to person, place, and time. She appears well-developed and well-nourished. No distress.  HENT:  Head: Normocephalic and atraumatic.  Right Ear: External ear normal.  Left Ear: External ear normal.  Nose: Nose normal.  Mouth/Throat: No oropharyngeal exudate.  Oropharynx slightly erythematous, no exudate.  Bilateral tympanic membranes are normal in appearance.  Bilateral frontal and maxillary sinuses are nontender to palpation.   Eyes: Conjunctivae and EOM are  normal. Pupils are equal, round, and reactive to light.  Neck: Normal range of motion. Neck supple. No JVD present.  Cardiovascular: Normal rate, regular rhythm, normal heart sounds and intact distal pulses.  Exam reveals no gallop and no friction rub.   No murmur heard. Pulmonary/Chest: Effort normal. No stridor. No respiratory distress. She has no wheezes. She has no rales. She exhibits no tenderness.  Mild rhonchi heard in bilateral lung  bases.  Musculoskeletal: Normal range of motion.  Lymphadenopathy:    She has no cervical adenopathy.  Neurological: She is alert and oriented to person, place, and time.  Skin: Skin is warm and dry. No rash noted. She is not diaphoretic. No erythema. No pallor.  Psychiatric: She has a normal mood and affect. Her behavior is normal. Judgment and thought content normal.    Filed Vitals:   08/28/13 1011  BP: 124/68  Pulse: 75  Temp: 97.8 F (36.6 C)  Resp: 20   Lab Results  Component Value Date   WBC 4.4 02/02/2013   HGB 13.6 02/02/2013   HCT 40.1 02/02/2013   PLT 120* 02/02/2013   GLUCOSE 104* 02/02/2013   CHOL 145 07/12/2012   TRIG 76 07/12/2012   HDL 52 07/12/2012   LDLCALC 78 07/12/2012   ALT 14 03/07/2013   AST 18 03/07/2013   NA 137 02/02/2013   K 4.0 02/02/2013   CL 99 02/02/2013   CREATININE 0.78 02/02/2013   BUN 10 02/02/2013   CO2 28 02/02/2013   TSH 0.45 08/23/2012   INR 1.06 02/02/2013   HGBA1C 5.7* 07/11/2012       Assessment & Plan:  Cherrelle was seen today for follow-up.  Diagnoses and associated orders for this visit:  Follow up - CBC with Differential - Basic Metabolic Panel - Pulmonary Function Test; Future - Ambulatory referral to Pulmonology  Sinusitis Comments: Resolved  Cough Comments: Now 3 weeks of cough, not improved with treatment of sinusitis and OTC mucinex.  - CBC with Differential - Basic Metabolic Panel - Pulmonary Function Test; Future - Ambulatory referral to Pulmonology  COPD (chronic obstructive pulmonary disease) Comments: Has diagnosis, never seen by pulmonology. Now with worsening chronic repsiratory symptoms. - CBC with Differential - Basic Metabolic Panel - Pulmonary Function Test; Future - Ambulatory referral to Pulmonology    Plan followup in about a month to make sure appointments having kept and to maintain continuity of care.  Patient Instructions  You will be called to schedule an appointment for your pulmonary  function tests. We will call you with the results of this when they are available.  We will call you with the results of the lab work when they are available.  You'll be called to schedule an appointment with a pulmonologist.  Followup in about a month to make sure that appointments having kept and to maintain continuity of care.  Followup sooner as needed.

## 2013-08-28 NOTE — Telephone Encounter (Signed)
Called pt and spoke with her about her lab work results, which were all within normal limits, except for her platelet count, which is stable for the past year. Instructed pt to keep her follow up plan with [place] for after she has been evaluated by pulmonology. Pt stated that earlier today she had already scheduled appointments with pulmonology for June. Urged Pt to keep these appointments and to follow up for anything else as needed.

## 2013-08-28 NOTE — Patient Instructions (Signed)
You will be called to schedule an appointment for your pulmonary function tests. We will call you with the results of this when they are available.  We will call you with the results of the lab work when they are available.  You'll be called to schedule an appointment with a pulmonologist.  Followup in about a month to make sure that appointments having kept and to maintain continuity of care.  Followup sooner as needed.  Chronic Obstructive Pulmonary Disease Chronic obstructive pulmonary disease (COPD) is a common lung problem. In COPD, the flow of air from the lungs is limited. The way your lungs work will probably never return to normal, but there are things you can do to improve you lungs and make yourself feel better. HOME CARE  Take all medicines as told by your doctor.  Only take over-the-counter or prescription medicines as told by your doctor.  Avoid medicines or cough syrups that dry up your airway (such as antihistamines) and do not allow you to get rid of thick spit. You do not need to avoid them if told differently by your doctor.  If you smoke, stop. Smoking makes the problem worse.  Avoid being around things that make your breathing worse (like smoke, chemicals, and fumes).  Use oxygen therapy and therapy to help improve your lungs (pulmonary rehabilitation) if told by your doctor. If you need home oxygen therapy, ask your doctor if you should buy a tool to measure your oxygen level (oximeter).  Avoid people who have a sickness you can catch (contagious).  Avoid going outside when it is very hot, cold, or humid.  Eat healthy foods. Eat smaller meals more often. Rest before meals.  Stay active, but remember to also rest.  Make sure to get all the shots (vaccines) your doctor recommends. Ask your doctor if you need a pneumonia shot.  Learn and use tips on how to relax.  Learn and use tips on how to control your breathing as told by your doctor. Try:  Breathing in  (inhaling) through your nose for 1 second. Then, pucker your lips and breath out (exhale) through your lips for 2 seconds.  Putting one hand on your belly (abdomen). Breathe in slowly through your nose for 1 second. Your hand on your belly should move out. Pucker your lips and breathe out slowly through your lips. Your hand on your belly should move in as you breathe out.  Learn and use controlled coughing to clear thick spit from your lungs. 1. Lean your head a little forward. 2. Breathe in deeply. 3. Try to hold your breath for 3 seconds. 4. Keep your mouth slightly open while coughing 2 times. 5. Spit any thick spit out into a tissue. 6. Rest and do the steps again 1 or 2 times as needed. GET HELP IF:  You cough up more thick spit than usual.  There is a change in the color or thickness of the spit.  It is harder to breathe than usual.  Your breathing is faster than usual. GET HELP RIGHT AWAY IF:   You have shortness of breath while resting.  You have shortness of breath that stops you from:  Being able to talk.  Doing normal activities.  You chest hurts for longer than 5 minutes.  Your skin color is more blue than usual.  Your pulse oximeter shows that you have low oxygen for longer than 5 minutes. MAKE SURE YOU:   Understand these instructions.  Will watch your  condition.  Will get help right away if you are not doing well or get worse. Document Released: 09/15/2007 Document Revised: 01/17/2013 Document Reviewed: 11/23/2012 [facility] Patient Information 2014 [place], [place].

## 2013-08-29 ENCOUNTER — Telehealth: Payer: Self-pay | Admitting: Internal Medicine

## 2013-08-29 NOTE — Telephone Encounter (Signed)
Pt daughter called wanted to know if there is something that can be prescribed for her mom at night for  cough and drainage. She said her mom is not able to rest  at night .

## 2013-08-30 MED ORDER — BENZONATATE 100 MG PO CAPS: 100.0000 mg | ORAL_CAPSULE | Freq: Every evening | ORAL | Status: DC | PRN

## 2013-08-30 NOTE — Telephone Encounter (Signed)
Try tessalon perle- 1 po qhs prn #10   /    0 refills

## 2013-08-30 NOTE — Telephone Encounter (Signed)
rx sent in electronically to [facility], pt aware

## 2013-09-08 ENCOUNTER — Other Ambulatory Visit: Payer: Self-pay | Admitting: Internal Medicine

## 2013-09-18 ENCOUNTER — Ambulatory Visit (INDEPENDENT_AMBULATORY_CARE_PROVIDER_SITE_OTHER): Payer: Medicare Other | Admitting: Emergency Medicine

## 2013-09-18 DIAGNOSIS — R05 Cough: Secondary | ICD-10-CM

## 2013-09-18 DIAGNOSIS — R059 Cough, unspecified: Secondary | ICD-10-CM

## 2013-09-18 DIAGNOSIS — J449 Chronic obstructive pulmonary disease, unspecified: Secondary | ICD-10-CM

## 2013-09-18 DIAGNOSIS — Z09 Encounter for follow-up examination after completed treatment for conditions other than malignant neoplasm: Secondary | ICD-10-CM

## 2013-09-18 DIAGNOSIS — J4489 Other specified chronic obstructive pulmonary disease: Secondary | ICD-10-CM

## 2013-09-18 NOTE — Progress Notes (Signed)
PFT done today. 

## 2013-09-25 ENCOUNTER — Ambulatory Visit (INDEPENDENT_AMBULATORY_CARE_PROVIDER_SITE_OTHER): Payer: Medicare Other | Admitting: Emergency Medicine

## 2013-09-25 ENCOUNTER — Encounter: Payer: Self-pay | Admitting: Emergency Medicine

## 2013-09-25 VITALS — BP 128/84 | HR 64 | Ht 68.0 in | Wt 139.4 lb

## 2013-09-25 DIAGNOSIS — J45909 Unspecified asthma, uncomplicated: Secondary | ICD-10-CM

## 2013-09-25 MED ORDER — AEROCHAMBER PLUS FLO-VU LARGE MISC: Status: DC

## 2013-09-25 NOTE — Progress Notes (Signed)
Subjective:    Patient ID: [name] [name], female    DOB: 10-Jul-1931, 80 y.o.   MRN: [id number]  HPI 80 yo woman, never smoker, hx of CAD, LBBB, A Fib, non-ischemic CM. She is referred for possible obstructive lung disease. She had a CXR that showed suspected hyperinflation. She has had a bit more trouble with allergy season causing persistent cough, mucous. She also has had URI's over the last 2 years - last longer and more symptomatic. PFT done 09/18/13 > moderate AFL with + BD response. Normal TLC. Decreased DLCO.    Review of Systems  Constitutional: Negative for fever and unexpected weight change.  HENT: Positive for postnasal drip. Negative for congestion, dental problem, ear pain, nosebleeds, rhinorrhea, sinus pressure, sneezing, sore throat and trouble swallowing.   Eyes: Negative for redness and itching.  Respiratory: Positive for cough, shortness of breath and wheezing. Negative for chest tightness.   Cardiovascular: Positive for palpitations. Negative for leg swelling.  Gastrointestinal: Negative for nausea and vomiting.  Genitourinary: Negative for dysuria.  Musculoskeletal: Negative for joint swelling.  Skin: Negative for rash.  Neurological: Negative for headaches.  Hematological: Does not bruise/bleed easily.  Psychiatric/Behavioral: Negative for dysphoric mood. The patient is not nervous/anxious.     Past Medical History  Diagnosis Date  . MELANOMA 09/06/2008  . HYPERLIPIDEMIA 10/06/2007  . HYPERTENSION 10/06/2007  . MYOCARDIAL INFARCTION, HX OF 10/06/2007  . CORONARY ARTERY DISEASE 10/06/2007    a. s/p PCI to LAD; b. s/p CABG; c. LHC 07/13/12: Proximal LAD stent patent, LIMA-LAD atretic, D1 occluded, proximal circumflex 30%, mid RCA occluded, SVG-D1 normal, SVG-OM2 normal, SVG-distal RCA normal, EF 40% with diffuse HK  . LBBB 09/06/2008  . Atrial fibrillation 10/06/2007    post op  . VARICOSE VEIN 09/29/2009  . UTERINE CANCER, HX OF 09/06/2008  . PERSONAL HX COLONIC POLYPS  10/30/2009  . Syncope   . Dizziness   . NICM (nonischemic cardiomyopathy)     Echocardiogram 07/12/12: EF 25-30%, diffuse HK, mild AI, mild MR, mild LAE  . COPD (chronic obstructive pulmonary disease)     Emphysema on 02/03/13 CXR     Family History  Problem Relation Age of Onset  . Stroke Mother   . Heart attack Father   . Colon cancer Neg Hx      History   Social History  . Marital Status: Widowed    Spouse Name: N/A    Number of Children: N/A  . Years of Education: N/A   Occupational History  . Not on file.   Social History Main Topics  . Smoking status: Never Smoker   . Smokeless tobacco: Never Used  . Alcohol Use: Yes     Comment: daily gin or wine  . Drug Use: No  . Sexual Activity: Not on file   Other Topics Concern  . Not on file   Social History Narrative  . No narrative on file     Allergies  Allergen Reactions  . Nitroglycerin Other (See Comments)    sensitive to tabs which causes rapid drop in blood pressure. Is ok to use oral spray form     Outpatient Prescriptions Prior to Visit  Medication Sig Dispense Refill  . atorvastatin (LIPITOR) 20 MG tablet       . carvedilol (COREG) 6.25 MG tablet Take 1 tablet (6.25 mg total) by mouth 2 (two) times daily with a meal.  60 tablet  5  . clopidogrel (PLAVIX) 75 MG tablet TAKE 1 TABLET DAILY  90 tablet  1  . escitalopram (LEXAPRO) 10 MG tablet       . nitroGLYCERIN (NITROLINGUAL) 0.4 MG/SPRAY spray Place 1 spray under the tongue every 5 (five) minutes as needed for chest pain.      Marland Kitchen pyridostigmine (MESTINON) 60 MG tablet Take 0.5 tablets (30 mg total) by mouth 3 (three) times daily.  90 tablet  10  . benzonatate (TESSALON PERLES) 100 MG capsule Take 1 capsule (100 mg total) by mouth at bedtime as needed for cough.  10 capsule  0  . fluticasone (FLONASE) 50 MCG/ACT nasal spray Place 2 sprays into both nostrils daily.  16 g  6   No facility-administered medications prior to visit.       Objective:    Physical Exam Filed Vitals:   09/25/13 1033  BP: 128/84  Pulse: 64  Height: 5\' 8"  (1.727 m)  Weight: 139 lb 6.4 oz (63.231 kg)  SpO2: 95%   Gen: Pleasant, well-nourished, in no distress,  normal affect  ENT: No lesions,  mouth clear,  oropharynx clear, no postnasal drip  Neck: No JVD, no TMG, no carotid bruits  Lungs: No use of accessory muscles, clear without rales or rhonchi  Cardiovascular: RRR, heart sounds normal, no murmur or gallops, no peripheral edema  Musculoskeletal: No deformities, no cyanosis or clubbing  Neuro: alert, non focal  Skin: Warm, no lesions or rashes      Assessment & Plan:  Asthma, intrinsic PFT are consistent with asthma. Unclear why we are dx at age 80 but I suspect it is because she has always been in good physical condition.  - we will do a trial of pre-treating exercise with albuterol.  - rov to discuss in 2 months

## 2013-09-25 NOTE — Patient Instructions (Signed)
We will try using albuterol 2 puffs through a spacer prior to exercise to see if it helps your breathing Your walking oxygen test was normal. Follow with Dr Lamonte Sakai in 2 months or sooner if you have any problems.

## 2013-09-25 NOTE — Assessment & Plan Note (Signed)
PFT are consistent with asthma. Unclear why we are dx at age 80 but I suspect it is because she has always been in good physical condition.  - we will do a trial of pre-treating exercise with albuterol.  - rov to discuss in 2 months

## 2013-09-28 ENCOUNTER — Telehealth: Payer: Self-pay

## 2013-09-28 ENCOUNTER — Ambulatory Visit: Payer: Medicare Other | Admitting: Physician Assistant

## 2013-09-28 ENCOUNTER — Encounter: Payer: Self-pay | Admitting: Physician Assistant

## 2013-09-28 ENCOUNTER — Other Ambulatory Visit: Payer: Self-pay | Admitting: Internal Medicine

## 2013-09-28 ENCOUNTER — Ambulatory Visit (INDEPENDENT_AMBULATORY_CARE_PROVIDER_SITE_OTHER): Payer: Medicare Other | Admitting: Physician Assistant

## 2013-09-28 VITALS — BP 120/68 | HR 68 | Temp 97.9°F | Resp 18 | Wt 140.0 lb

## 2013-09-28 DIAGNOSIS — J45909 Unspecified asthma, uncomplicated: Secondary | ICD-10-CM

## 2013-09-28 DIAGNOSIS — Z09 Encounter for follow-up examination after completed treatment for conditions other than malignant neoplasm: Secondary | ICD-10-CM

## 2013-09-28 DIAGNOSIS — Z23 Encounter for immunization: Secondary | ICD-10-CM

## 2013-09-28 NOTE — Progress Notes (Signed)
Pre visit review using our clinic review tool, if applicable. No additional management support is needed unless otherwise documented below in the visit note. 

## 2013-09-28 NOTE — Patient Instructions (Signed)
Continue current medications. See how the albuterol treatment helps your symptoms.  If emergency symptoms discussed during visit developed, seek medical attention immediately.  Followup as needed, or for worsening or persistent symptoms despite treatment.   Pneumococcal Vaccine, Polyvalent suspension for injection What is this medicine? PNEUMOCOCCAL VACCINE, POLYVALENT (NEU mo KOK al vak SEEN, pol ee VEY luhnt) is a vaccine to prevent pneumococcus bacteria infection. These bacteria are a major cause of ear infections, 'Strep throat' infections, and serious pneumonia, meningitis, or blood infections worldwide. These vaccines help the body to produce antibodies (protective substances) that help your body defend against these bacteria. This vaccine is recommended for infants and young children. This vaccine will not treat an infection. This medicine may be used for other purposes; ask your health care provider or pharmacist if you have questions. COMMON BRAND NAME(S): Prevnar 13 What should I tell my health care provider before I take this medicine? They need to know if you have any of these conditions: -bleeding problems -fever -immune system problems -low platelet count in the blood -seizures -an unusual or allergic reaction to pneumococcal vaccine, diphtheria toxoid, other vaccines, latex, other medicines, foods, dyes, or preservatives -pregnant or trying to get pregnant -breast-feeding How should I use this medicine? This vaccine is for injection into a muscle. It is given by a health care professional. A copy of Vaccine Information Statements will be given before each vaccination. Read this sheet carefully each time. The sheet may change frequently. Talk to your pediatrician regarding the use of this medicine in children. While this drug may be prescribed for children as young as 70 weeks old for selected conditions, precautions do apply. Overdosage: If you think you have taken too much of  this medicine contact a poison control center or emergency room at once. NOTE: This medicine is only for you. Do not share this medicine with others. What if I miss a dose? It is important not to miss your dose. Call your doctor or health care professional if you are unable to keep an appointment. What may interact with this medicine? -medicines for cancer chemotherapy -medicines that suppress your immune function -medicines that treat or prevent blood clots like warfarin, enoxaparin, and dalteparin -steroid medicines like prednisone or cortisone This list may not describe all possible interactions. Give your health care provider a list of all the medicines, herbs, non-prescription drugs, or dietary supplements you use. Also tell them if you smoke, drink alcohol, or use illegal drugs. Some items may interact with your medicine. What should I watch for while using this medicine? Mild fever and pain should go away in 3 days or less. Report any unusual symptoms to your doctor or health care professional. What side effects may I notice from receiving this medicine? Side effects that you should report to your doctor or health care professional as soon as possible: -allergic reactions like skin rash, itching or hives, swelling of the face, lips, or tongue -breathing problems -confused -fever over 102 degrees F -pain, tingling, numbness in the hands or feet -seizures -unusual bleeding or bruising -unusual muscle weakness Side effects that usually do not require medical attention (report to your doctor or health care professional if they continue or are bothersome): -aches and pains -diarrhea -fever of 102 degrees F or less -headache -irritable -loss of appetite -pain, tender at site where injected -trouble sleeping This list may not describe all possible side effects. Call your doctor for medical advice about side effects. You may report side effects  to FDA at 1-800-FDA-1088. Where should I  keep my medicine? This does not apply. This vaccine is given in a clinic, pharmacy, doctor's office, or other health care setting and will not be stored at home. NOTE: This sheet is a summary. It may not cover all possible information. If you have questions about this medicine, talk to your doctor, pharmacist, or health care provider.  2015, [employer] Standard. (2008-06-11 10:17:22)

## 2013-09-28 NOTE — Progress Notes (Signed)
Subjective:    Patient ID: [name] [name], female    DOB: 04/27/1931, 80 y.o.   MRN: [id number]  HPI Patient is a 80 y.o. female presenting for follow up of PFTs and Pulmonology visit. Pt had PFTs done last week and pulmonology visit on 09/25/2013 where she was diagnosed with intrinsic asthma, and prescribed an albuterol inhaler to see if this improved her symptoms. Her late diagnosis of this was presumed to be due to her overall healthy lifestyle. Pt has actually not received any albuterol yet, and it was dosed as needed just prior to doing any exercise, and assuming that she tolerates this well, she could be switched to a longer acting medication. She is still complaining of occasional runny nose and cold type symptoms. The hope is that these will resolve once she begins taking her asthma therapy. Pt denies fevers, chills, nausea, vomiting, diarrhea, SOB, chest pain, and headache.  Pt also concerned about possibly needing the pneumonia vaccine. Her last vaccine was in 2010. With her new diagnosis of asthma, she wants to make sure she is up to date on her pneumonia vaccine.    Review of Systems As per HPI and are otherwise negative.    Past Medical History  Diagnosis Date  . MELANOMA 09/06/2008  . HYPERLIPIDEMIA 10/06/2007  . HYPERTENSION 10/06/2007  . MYOCARDIAL INFARCTION, HX OF 10/06/2007  . CORONARY ARTERY DISEASE 10/06/2007    a. s/p PCI to LAD; b. s/p CABG; c. LHC 07/13/12: Proximal LAD stent patent, LIMA-LAD atretic, D1 occluded, proximal circumflex 30%, mid RCA occluded, SVG-D1 normal, SVG-OM2 normal, SVG-distal RCA normal, EF 40% with diffuse HK  . LBBB 09/06/2008  . Atrial fibrillation 10/06/2007    post op  . VARICOSE VEIN 09/29/2009  . UTERINE CANCER, HX OF 09/06/2008  . PERSONAL HX COLONIC POLYPS 10/30/2009  . Syncope   . Dizziness   . NICM (nonischemic cardiomyopathy)     Echocardiogram 07/12/12: EF 25-30%, diffuse HK, mild AI, mild MR, mild LAE  . COPD (chronic obstructive  pulmonary disease)     Emphysema on 02/03/13 CXR    History   Social History  . Marital Status: Widowed    Spouse Name: N/A    Number of Children: N/A  . Years of Education: N/A   Occupational History  . Not on file.   Social History Main Topics  . Smoking status: Never Smoker   . Smokeless tobacco: Never Used  . Alcohol Use: Yes     Comment: daily gin or wine  . Drug Use: No  . Sexual Activity: Not on file   Other Topics Concern  . Not on file   Social History Narrative  . No narrative on file    Past Surgical History  Procedure Laterality Date  . Oophorectomy    . Right distal pretibeal      melanoma  . Coronary angioplasty with stent placement  2009  . Coronary artery bypass graft  2004  . Abdominal hysterectomy  1988  . Cardiac catheterization  08/30/2008  . Bi-ventricular pacemaker insertion (crt-p)  02/02/2013    [employer]. Jude, serial no. #[id number]     Family History  Problem Relation Age of Onset  . Stroke Mother   . Heart attack Father   . Colon cancer Neg Hx     Allergies  Allergen Reactions  . Nitroglycerin Other (See Comments)    sensitive to tabs which causes rapid drop in blood pressure. Is ok to use oral spray form  Current Outpatient Prescriptions on File Prior to Visit  Medication Sig Dispense Refill  . benzonatate (TESSALON PERLES) 100 MG capsule Take 1 capsule (100 mg total) by mouth at bedtime as needed for cough.  10 capsule  0  . carvedilol (COREG) 6.25 MG tablet Take 1 tablet (6.25 mg total) by mouth 2 (two) times daily with a meal.  60 tablet  5  . clopidogrel (PLAVIX) 75 MG tablet TAKE 1 TABLET DAILY  90 tablet  1  . escitalopram (LEXAPRO) 10 MG tablet       . fluticasone (FLONASE) 50 MCG/ACT nasal spray Place 2 sprays into both nostrils daily.  16 g  6  . nitroGLYCERIN (NITROLINGUAL) 0.4 MG/SPRAY spray Place 1 spray under the tongue every 5 (five) minutes as needed for chest pain.      Marland Kitchen pyridostigmine (MESTINON) 60 MG tablet Take  0.5 tablets (30 mg total) by mouth 3 (three) times daily.  90 tablet  10  . Spacer/Aero-Holding Chambers (AEROCHAMBER PLUS FLO-VU LARGE) MISC Use as directed  1 each  0   No current facility-administered medications on file prior to visit.    EXAM: BP 120/68  Pulse 68  Temp(Src) 97.9 F (36.6 C) (Oral)  Resp 18  Wt 140 lb (63.504 kg)  SpO2 98%     Objective:   Physical Exam  Nursing note and vitals reviewed. Constitutional: She is oriented to person, place, and time. She appears well-developed and well-nourished. No distress.  HENT:  Head: Normocephalic and atraumatic.  Eyes: Conjunctivae and EOM are normal. Pupils are equal, round, and reactive to light.  Neck: Normal range of motion.  Cardiovascular: Normal rate, regular rhythm, normal heart sounds and intact distal pulses.   Pulmonary/Chest: Effort normal and breath sounds normal. No respiratory distress. She has no wheezes. She has no rales. She exhibits no tenderness.  Musculoskeletal: Normal range of motion.  Neurological: She is alert and oriented to person, place, and time.  Skin: Skin is warm and dry. No rash noted. She is not diaphoretic. No erythema. No pallor.  Psychiatric: She has a normal mood and affect. Her behavior is normal. Judgment and thought content normal.     Lab Results  Component Value Date   WBC 4.6 08/28/2013   HGB 12.7 08/28/2013   HCT 38.0 08/28/2013   PLT 137.0* 08/28/2013   GLUCOSE 85 08/28/2013   CHOL 145 07/12/2012   TRIG 76 07/12/2012   HDL 52 07/12/2012   LDLCALC 78 07/12/2012   ALT 14 03/07/2013   AST 18 03/07/2013   NA 135 08/28/2013   K 3.8 08/28/2013   CL 100 08/28/2013   CREATININE 0.7 08/28/2013   BUN 12 08/28/2013   CO2 30 08/28/2013   TSH 0.45 08/23/2012   INR 1.06 02/02/2013   HGBA1C 5.7* 07/11/2012        Assessment & Plan:  [name] [name] was seen today for 1 month follow up.  Diagnoses and associated orders for this visit:  Need for prophylactic vaccination against Streptococcus  pneumoniae (pneumococcus) Comments: Prevnar given. Risk benefits discussed with Pt and she agreed to receive vaccine.  - Pneumococcal conjugate vaccine 13-valent IM  Follow up Comments: Overall doing well. Still experiencing symptoms, now with new Dx of asthma, has not started tx yet.  Intrinsic asthma, unspecified asthma severity, uncomplicated Comments: Being managed by Pulmonology now. Will monitor symptoms for improvement on treatment.    Pt concerned that she has not had pneumonia vaccine in 5 years. Discussed Prevnar, pt  understood risks/benefits. Decided to get vaccine today.   Return precautions provided, and patient handout on Prevnar Vaccine.  Plan to follow up as needed, or for worsening or persistent symptoms despite treatment.  Patient Instructions  Continue current medications. See how the albuterol treatment helps your symptoms.  If emergency symptoms discussed during visit developed, seek medical attention immediately.  Followup as needed, or for worsening or persistent symptoms despite treatment.

## 2013-09-28 NOTE — Telephone Encounter (Signed)
Pt was seen in our office today and states she was to start Albuterol prn. Which albuterol inhaler would you like sent?  Pt would like it sent to [facility] on [address].  Thanks!

## 2013-10-01 MED ORDER — ALBUTEROL SULFATE HFA 108 (90 BASE) MCG/ACT IN AERS: 2.0000 | INHALATION_SPRAY | RESPIRATORY_TRACT | Status: DC | PRN

## 2013-10-01 NOTE — Telephone Encounter (Signed)
Ventolin Rx sent to [facility]. Pt aware and voiced no further questions or concerns at this time.

## 2013-10-01 NOTE — Telephone Encounter (Signed)
Send ventolin 2 puffs q4h prn

## 2013-10-09 ENCOUNTER — Ambulatory Visit (INDEPENDENT_AMBULATORY_CARE_PROVIDER_SITE_OTHER): Payer: Medicare Other | Admitting: Physician Assistant

## 2013-10-09 ENCOUNTER — Encounter: Payer: Self-pay | Admitting: Physician Assistant

## 2013-10-09 VITALS — BP 108/64 | HR 68 | Temp 98.3°F | Resp 18 | Wt 142.0 lb

## 2013-10-09 DIAGNOSIS — W5501XA Bitten by cat, initial encounter: Secondary | ICD-10-CM

## 2013-10-09 DIAGNOSIS — T148XXA Other injury of unspecified body region, initial encounter: Secondary | ICD-10-CM

## 2013-10-09 DIAGNOSIS — IMO0001 Reserved for inherently not codable concepts without codable children: Secondary | ICD-10-CM

## 2013-10-09 MED ORDER — AMOXICILLIN-POT CLAVULANATE 875-125 MG PO TABS: 1.0000 | ORAL_TABLET | Freq: Two times a day (BID) | ORAL | Status: DC

## 2013-10-09 NOTE — Progress Notes (Signed)
Pre visit review using our clinic review tool, if applicable. No additional management support is needed unless otherwise documented below in the visit note. 

## 2013-10-09 NOTE — Patient Instructions (Addendum)
Augmentin twice daily for 10 days to prevent infection.   Dress the area daily as shown in office with topical antibiotic ointment and nonstick dressing.  Keep the area clean and dry.  Continue to monitor the area for signs of worsening.  If emergency symptoms discussed during visit developed, seek medical attention immediately.  Followup as needed, or for worsening or persistent symptoms despite treatment.    Animal Bite Animal bite wounds can get infected. It is important to get proper medical treatment. Ask your doctor if you need a rabies shot. HOME CARE   Follow your doctor's instructions for taking care of your wound.  Only take medicine as told by your doctor.  Take your medicine (antibiotics) as told. Finish them even if you start to feel better.  Keep all doctor visits as told. You may need a tetanus shot if:   You cannot remember when you had your last tetanus shot.  You have never had a tetanus shot.  The injury broke your skin. If you need a tetanus shot and you choose not to have one, you may get tetanus. Sickness from tetanus can be serious. GET HELP RIGHT AWAY IF:   Your wound is warm, red, sore, or puffy (swollen).  You notice yellowish-white fluid (pus) or a bad smell coming from the wound.  You see a red line on the skin coming from the wound.  You have a fever, chills, or you feel sick.  You feel sick to your stomach (nauseous), or you throw up (vomit).  Your pain does not go away, or it gets worse.  You have trouble moving the injured part.  You have questions or concerns. MAKE SURE YOU:   Understand these instructions.  Will watch your condition.  Will get help right away if you are not doing well or get worse. Document Released: 03/29/2005 Document Revised: 06/21/2011 Document Reviewed: 11/18/2010 [facility] Patient Information 2015 [place], [place]. This information is not intended to replace advice given to you by your health care  provider. Make sure you discuss any questions you have with your health care provider.   Wound Care Wound care helps prevent pain and infection.  You may need a tetanus shot if:  You cannot remember when you had your last tetanus shot.  You have never had a tetanus shot.  The injury broke your skin. If you need a tetanus shot and you choose not to have one, you may get tetanus. Sickness from tetanus can be serious. HOME CARE   Only take medicine as told by your doctor.  Clean the wound daily with mild soap and water.  Change any bandages (dressings) as told by your doctor.  Put medicated cream and a bandage on the wound as told by your doctor.  Change the bandage if it gets wet, dirty, or starts to smell.  Take showers. Do not take baths, swim, or do anything that puts your wound under water.  Rest and raise (elevate) the wound until the pain and puffiness (swelling) are better.  Keep all doctor visits as told. GET HELP RIGHT AWAY IF:   Yellowish-white fluid (pus) comes from the wound.  Medicine does not lessen your pain.  There is a red streak going away from the wound.  You have a fever. MAKE SURE YOU:   Understand these instructions.  Will watch your condition.  Will get help right away if you are not doing well or get worse. Document Released: 01/06/2008 Document Revised: 06/21/2011 Document Reviewed:  08/02/2010 [employer] Patient Information 2015 [place], [place]. This information is not intended to replace advice given to you by your health care provider. Make sure you discuss any questions you have with your health care provider.

## 2013-10-09 NOTE — Progress Notes (Signed)
Subjective:    Patient ID: [name] [name], female    DOB: 05-05-1931, 80 y.o.   MRN: [id number]  Animal Bite  The incident occurred yesterday (last night). The incident occurred at home. She came to the ER via personal transport. There is an injury to the right forearm and right hand. The pain is mild. It is unlikely that a foreign body is present. Pertinent negatives include no chest pain, no visual disturbance, no abdominal pain, no bowel incontinence, no nausea, no vomiting, no headaches, no hearing loss, no inability to bear weight, no neck pain, no pain when bearing weight, no focal weakness, no decreased responsiveness, no light-headedness, no loss of consciousness, no seizures, no tingling, no weakness, no cough and no difficulty breathing. There have been no prior injuries to these areas. She is right-handed. Her tetanus status is UTD. She has been behaving normally. There were no sick contacts.  Pt immediately washed and cleaned the area. Covered in antibiotic ointment, and a bandage, and then wrapped in stretching bandage. She also applied ice over the area.    Review of Systems  Constitutional: Negative for fever, chills and decreased responsiveness.  HENT: Negative for hearing loss.   Eyes: Negative for visual disturbance.  Respiratory: Negative for cough and shortness of breath.   Cardiovascular: Negative for chest pain.  Gastrointestinal: Negative for nausea, vomiting, abdominal pain, diarrhea and bowel incontinence.  Musculoskeletal: Negative for neck pain.  Skin: Positive for wound. Negative for color change, pallor and rash.  Neurological: Negative for tingling, focal weakness, seizures, loss of consciousness, weakness, light-headedness and headaches.  All other systems reviewed and are negative.     Past Medical History  Diagnosis Date  . MELANOMA 09/06/2008  . HYPERLIPIDEMIA 10/06/2007  . HYPERTENSION 10/06/2007  . MYOCARDIAL INFARCTION, HX OF 10/06/2007  . CORONARY  ARTERY DISEASE 10/06/2007    a. s/p PCI to LAD; b. s/p CABG; c. LHC 07/13/12: Proximal LAD stent patent, LIMA-LAD atretic, D1 occluded, proximal circumflex 30%, mid RCA occluded, SVG-D1 normal, SVG-OM2 normal, SVG-distal RCA normal, EF 40% with diffuse HK  . LBBB 09/06/2008  . Atrial fibrillation 10/06/2007    post op  . VARICOSE VEIN 09/29/2009  . UTERINE CANCER, HX OF 09/06/2008  . PERSONAL HX COLONIC POLYPS 10/30/2009  . Syncope   . Dizziness   . NICM (nonischemic cardiomyopathy)     Echocardiogram 07/12/12: EF 25-30%, diffuse HK, mild AI, mild MR, mild LAE  . COPD (chronic obstructive pulmonary disease)     Emphysema on 02/03/13 CXR    History   Social History  . Marital Status: Widowed    Spouse Name: N/A    Number of Children: N/A  . Years of Education: N/A   Occupational History  . Not on file.   Social History Main Topics  . Smoking status: Never Smoker   . Smokeless tobacco: Never Used  . Alcohol Use: Yes     Comment: daily gin or wine  . Drug Use: No  . Sexual Activity: Not on file   Other Topics Concern  . Not on file   Social History Narrative  . No narrative on file    Past Surgical History  Procedure Laterality Date  . Oophorectomy    . Right distal pretibeal      melanoma  . Coronary angioplasty with stent placement  2009  . Coronary artery bypass graft  2004  . Abdominal hysterectomy  1988  . Cardiac catheterization  08/30/2008  . Bi-ventricular pacemaker  insertion (crt-p)  02/02/2013    [employer]. Jude, serial no. #[id number]     Family History  Problem Relation Age of Onset  . Stroke Mother   . Heart attack Father   . Colon cancer Neg Hx     Allergies  Allergen Reactions  . Nitroglycerin Other (See Comments)    sensitive to tabs which causes rapid drop in blood pressure. Is ok to use oral spray form    Current Outpatient Prescriptions on File Prior to Visit  Medication Sig Dispense Refill  . albuterol (VENTOLIN HFA) 108 (90 BASE) MCG/ACT inhaler  Inhale 2 puffs into the lungs every 4 (four) hours as needed.  1 Inhaler  6  . atorvastatin (LIPITOR) 20 MG tablet TAKE 1 TABLET DAILY  90 tablet  0  . carvedilol (COREG) 6.25 MG tablet Take 1 tablet (6.25 mg total) by mouth 2 (two) times daily with a meal.  60 tablet  5  . clopidogrel (PLAVIX) 75 MG tablet TAKE 1 TABLET DAILY  90 tablet  1  . escitalopram (LEXAPRO) 10 MG tablet       . fluticasone (FLONASE) 50 MCG/ACT nasal spray Place 2 sprays into both nostrils daily.  16 g  6  . nitroGLYCERIN (NITROLINGUAL) 0.4 MG/SPRAY spray Place 1 spray under the tongue every 5 (five) minutes as needed for chest pain.      Marland Kitchen pyridostigmine (MESTINON) 60 MG tablet Take 0.5 tablets (30 mg total) by mouth 3 (three) times daily.  90 tablet  10  . Spacer/Aero-Holding Chambers (AEROCHAMBER PLUS FLO-VU LARGE) MISC Use as directed  1 each  0   No current facility-administered medications on file prior to visit.    EXAM: BP 108/64  Pulse 68  Temp(Src) 98.3 F (36.8 C) (Oral)  Resp 18  Wt 142 lb (64.411 kg)  SpO2 96%      Objective:   Physical Exam  Nursing note and vitals reviewed. Constitutional: She is oriented to person, place, and time. She appears well-developed and well-nourished. No distress.  HENT:  Head: Normocephalic and atraumatic.  Eyes: Conjunctivae and EOM are normal. Pupils are equal, round, and reactive to light.  Neck: Normal range of motion.  Cardiovascular: Normal rate, regular rhythm and intact distal pulses.   Pulmonary/Chest: Effort normal and breath sounds normal. No respiratory distress. She exhibits no tenderness.  Musculoskeletal: Normal range of motion.  Neurological: She is alert and oriented to person, place, and time.  Skin: Skin is warm and dry. No rash noted. She is not diaphoretic. No erythema. No pallor.  1 cm wound to the right forearm proximal to the wrist.  Small superficial wound to dorsum of right hand.  No erythema, warmth, swelling.  Psychiatric: She  has a normal mood and affect. Her behavior is normal. Judgment and thought content normal.     Lab Results  Component Value Date   WBC 4.6 08/28/2013   HGB 12.7 08/28/2013   HCT 38.0 08/28/2013   PLT 137.0* 08/28/2013   GLUCOSE 85 08/28/2013   CHOL 145 07/12/2012   TRIG 76 07/12/2012   HDL 52 07/12/2012   LDLCALC 78 07/12/2012   ALT 14 03/07/2013   AST 18 03/07/2013   NA 135 08/28/2013   K 3.8 08/28/2013   CL 100 08/28/2013   CREATININE 0.7 08/28/2013   BUN 12 08/28/2013   CO2 30 08/28/2013   TSH 0.45 08/23/2012   INR 1.06 02/02/2013   HGBA1C 5.7* 07/11/2012  Assessment & Plan:  Kristene was seen today for animal bite.  Diagnoses and associated orders for this visit:  Cat bite, initial encounter Comments: 1 cm lesion. bleeding controlled. dressed in office and provide wound care instructions. Augmentin for prophylaxis. - amoxicillin-clavulanate (AUGMENTIN) 875-125 MG per tablet; Take 1 tablet by mouth 2 (two) times daily.    Dressing changes daily with antibiotic ointment, nonstick dressing, and coban wrap as dressed in office  Return precautions provided, and patient handout on animal bites and wound care.  Plan to follow up as needed, or for worsening or persistent symptoms despite treatment.  Patient Instructions  Augmentin twice daily for 10 days to prevent infection.   Dress the area daily as shown in office with topical antibiotic ointment and nonstick dressing.  Keep the area clean and dry.  Continue to monitor the area for signs of worsening.  If emergency symptoms discussed during visit developed, seek medical attention immediately.  Followup as needed, or for worsening or persistent symptoms despite treatment.

## 2013-10-31 LAB — PULMONARY FUNCTION TEST
DL/VA % pred: 66 %
DL/VA: 3.38 ml/min/mmHg/L
DLCO UNC % PRED: 54 %
DLCO UNC: 15.15 ml/min/mmHg
FEF 25-75 POST: 1.43 L/s
FEF 25-75 Pre: 0.88 L/sec
FEF2575-%Change-Post: 61 %
FEF2575-%Pred-Post: 91 %
FEF2575-%Pred-Pre: 56 %
FEV1-%Change-Post: 12 %
FEV1-%Pred-Post: 82 %
FEV1-%Pred-Pre: 73 %
FEV1-Post: 1.8 L
FEV1-Pre: 1.6 L
FEV1FVC-%Change-Post: 7 %
FEV1FVC-%Pred-Pre: 91 %
FEV6-%Change-Post: 5 %
FEV6-%Pred-Post: 88 %
FEV6-%Pred-Pre: 83 %
FEV6-POST: 2.45 L
FEV6-Pre: 2.32 L
FEV6FVC-%Change-Post: 0 %
FEV6FVC-%PRED-POST: 104 %
FEV6FVC-%PRED-PRE: 103 %
FVC-%CHANGE-POST: 4 %
FVC-%PRED-PRE: 80 %
FVC-%Pred-Post: 84 %
FVC-POST: 2.46 L
FVC-Pre: 2.35 L
PRE FEV1/FVC RATIO: 68 %
Post FEV1/FVC ratio: 73 %
Post FEV6/FVC ratio: 100 %
Pre FEV6/FVC Ratio: 99 %
RV % PRED: 108 %
RV: 2.72 L
TLC % pred: 93 %
TLC: 5.1 L

## 2013-11-09 ENCOUNTER — Other Ambulatory Visit (HOSPITAL_COMMUNITY): Payer: Self-pay | Admitting: Internal Medicine

## 2013-11-09 ENCOUNTER — Other Ambulatory Visit: Payer: Self-pay | Admitting: Internal Medicine

## 2013-11-10 ENCOUNTER — Other Ambulatory Visit: Payer: Self-pay | Admitting: Internal Medicine

## 2013-11-30 ENCOUNTER — Ambulatory Visit: Payer: Medicare Other | Admitting: Emergency Medicine

## 2013-12-09 ENCOUNTER — Other Ambulatory Visit: Payer: Self-pay | Admitting: Internal Medicine

## 2013-12-20 ENCOUNTER — Encounter (INDEPENDENT_AMBULATORY_CARE_PROVIDER_SITE_OTHER): Payer: Self-pay

## 2013-12-20 ENCOUNTER — Ambulatory Visit (INDEPENDENT_AMBULATORY_CARE_PROVIDER_SITE_OTHER): Payer: Medicare Other | Admitting: Emergency Medicine

## 2013-12-20 ENCOUNTER — Encounter: Payer: Self-pay | Admitting: Emergency Medicine

## 2013-12-20 VITALS — BP 122/66 | HR 62 | Temp 97.4°F | Ht 67.0 in | Wt 142.8 lb

## 2013-12-20 DIAGNOSIS — J4520 Mild intermittent asthma, uncomplicated: Secondary | ICD-10-CM

## 2013-12-20 DIAGNOSIS — J45909 Unspecified asthma, uncomplicated: Secondary | ICD-10-CM

## 2013-12-20 NOTE — Assessment & Plan Note (Signed)
Exertional SOB has responded nicely to pre-treating with albuterol. She is doing well, no side effects reported by her or her daughter [name]. I'd like to continue this routine. We could consider a long-acting BD in the future if her daily usage of SABA is high. Will follow in 6 months or prn/.

## 2013-12-20 NOTE — Patient Instructions (Signed)
Please continue to use your albuterol 2 puffs about 15 minutes before your exercise. You may also want to try using this if you have shortness of breath at any other time. Do not use any more frequently than every 4 hours.  Follow with Dr Lamonte Sakai in 6 months or sooner if you have any problems

## 2013-12-20 NOTE — Progress Notes (Signed)
   Subjective:    Patient ID: [name] [name], female    DOB: 25-May-1931, 80 y.o.   MRN: [id number]  HPI 80 yo woman, never smoker, hx of CAD, LBBB, A Fib, non-ischemic CM. She is referred for possible obstructive lung disease. She had a CXR that showed suspected hyperinflation. She has had a bit more trouble with allergy season causing persistent cough, mucous. She also has had URI's over the last 2 years - last longer and more symptomatic. PFT done 09/18/13 > moderate AFL with + BD response. Normal TLC. Decreased DLCO.   12/20/13 -- Follow up visit for dyspnea, moderate obstruction on PFT. Last time we did a trial of SABA before exercise to see if she would benefit > has n't used very frequently but she has noticed an improvement in her exercise tolerance. Rare wheeze w exertion.    Review of Systems  Constitutional: Negative for fever and unexpected weight change.  HENT: Positive for postnasal drip. Negative for congestion, dental problem, ear pain, nosebleeds, rhinorrhea, sinus pressure, sneezing, sore throat and trouble swallowing.   Eyes: Negative for redness and itching.  Respiratory: Positive for cough, shortness of breath and wheezing. Negative for chest tightness.   Cardiovascular: Positive for palpitations. Negative for leg swelling.  Gastrointestinal: Negative for nausea and vomiting.  Genitourinary: Negative for dysuria.  Musculoskeletal: Negative for joint swelling.  Skin: Negative for rash.  Neurological: Negative for headaches.  Hematological: Does not bruise/bleed easily.  Psychiatric/Behavioral: Negative for dysphoric mood. The patient is not nervous/anxious.        Objective:   Physical Exam Filed Vitals:   12/20/13 1039  BP: 122/66  Pulse: 62  Temp: 97.4 F (36.3 C)  TempSrc: Oral  Height: 5\' 7"  (1.702 m)  Weight: 142 lb 12.8 oz (64.774 kg)  SpO2: 97%   Gen: Pleasant, well-nourished, in no distress,  normal affect  ENT: No lesions,  mouth clear,  oropharynx  clear, no postnasal drip  Neck: No JVD, no TMG, no carotid bruits  Lungs: No use of accessory muscles, clear without rales or rhonchi, no wheezing  Cardiovascular: RRR, heart sounds normal, no murmur or gallops, no peripheral edema  Musculoskeletal: No deformities, no cyanosis or clubbing  Neuro: alert, non focal  Skin: Warm, no lesions or rashes      Assessment & Plan:  Asthma, intrinsic Exertional SOB has responded nicely to pre-treating with albuterol. She is doing well, no side effects reported by her or her daughter [name] [name]. I'd like to continue this routine. We could consider a long-acting BD in the future if her daily usage of SABA is high. Will follow in 6 months or prn/.

## 2013-12-21 ENCOUNTER — Ambulatory Visit (INDEPENDENT_AMBULATORY_CARE_PROVIDER_SITE_OTHER): Payer: Medicare Other

## 2013-12-21 DIAGNOSIS — Z23 Encounter for immunization: Secondary | ICD-10-CM

## 2013-12-27 ENCOUNTER — Other Ambulatory Visit: Payer: Self-pay | Admitting: Internal Medicine

## 2013-12-28 ENCOUNTER — Encounter: Payer: Self-pay | Admitting: Family Medicine

## 2013-12-28 ENCOUNTER — Ambulatory Visit (INDEPENDENT_AMBULATORY_CARE_PROVIDER_SITE_OTHER): Payer: Medicare Other | Admitting: Family Medicine

## 2013-12-28 VITALS — BP 130/68 | HR 64 | Temp 98.3°F | Wt 143.0 lb

## 2013-12-28 DIAGNOSIS — IMO0002 Reserved for concepts with insufficient information to code with codable children: Secondary | ICD-10-CM

## 2013-12-28 DIAGNOSIS — W5501XA Bitten by cat, initial encounter: Secondary | ICD-10-CM

## 2013-12-28 DIAGNOSIS — L03114 Cellulitis of left upper limb: Secondary | ICD-10-CM

## 2013-12-28 DIAGNOSIS — T148XXA Other injury of unspecified body region, initial encounter: Secondary | ICD-10-CM

## 2013-12-28 MED ORDER — AMOXICILLIN-POT CLAVULANATE 875-125 MG PO TABS: 1.0000 | ORAL_TABLET | Freq: Two times a day (BID) | ORAL | Status: DC

## 2013-12-28 NOTE — Patient Instructions (Addendum)
Cat Bite  Take antibiotic/augmentin for 7 days  If not completely healed, may take 10 days  If worsens come back to see [place] or if you get fevers, chills, expanding redness.

## 2013-12-28 NOTE — Progress Notes (Signed)
  Garret Reddish, MD Phone: [phone]  Subjective:   [name] [name] is a 80 y.o. year old very pleasant female patient who presents with the following:  Cat Bite Bitten by her cat about a week ago on left arm. Mild aching pain. Redness around spot with total area 3x3 cm. Has remained warm and has not seem to have improved as expected. Teatanus shot in 2011. Tried topical antibiotics with no improvement. Occasional tylenol for pain. Overall course stable to slightly worsening ROS-no fever chills nausea vomiting. No redness extending up the arm.  Past Medical History-paroxysmal A. fib, pacemaker due to AV block, CHF, history melanoma, hypertension, hyperlipidemia, CAD with history of MI  Medications- reviewed and updated Current Outpatient Prescriptions  Medication Sig Dispense Refill  . albuterol (VENTOLIN HFA) 108 (90 BASE) MCG/ACT inhaler Inhale 2 puffs into the lungs every 4 (four) hours as needed.  1 Inhaler  6  . atorvastatin (LIPITOR) 20 MG tablet TAKE 1 TABLET DAILY  90 tablet  1  . carvedilol (COREG) 3.125 MG tablet TAKE 1 TABLET BY MOUTH TWICE DAILY WITH A MEAL  60 tablet  6  . clopidogrel (PLAVIX) 75 MG tablet TAKE 1 TABLET DAILY  90 tablet  1  . escitalopram (LEXAPRO) 10 MG tablet TAKE 1 TABLET BY MOUTH EVERY DAY  90 tablet  0  . pyridostigmine (MESTINON) 60 MG tablet Take 0.5 tablets (30 mg total) by mouth 3 (three) times daily.  90 tablet  10  . amoxicillin-clavulanate (AUGMENTIN) 875-125 MG per tablet Take 1 tablet by mouth 2 (two) times daily.  20 tablet  0  . fluticasone (FLONASE) 50 MCG/ACT nasal spray Place 2 sprays into both nostrils daily as needed.       No current facility-administered medications for this visit.    Objective: BP 130/68  Pulse 64  Temp(Src) 98.3 F (36.8 C)  Wt 143 lb (64.864 kg) Gen: NAD, resting comfortably in chair Ext: no edema, 3 x 3 area of erythema with central bite noted and scarred over. Area is warm to touch and tender the  patient.  Assessment/Plan:  Cat Bite with local cellulitis Augmentin x7 days. If symptoms linger gave extra 3 days. Return precautions given  Meds ordered this encounter  Medications  . amoxicillin-clavulanate (AUGMENTIN) 875-125 MG per tablet    Sig: Take 1 tablet by mouth 2 (two) times daily.    Dispense:  20 tablet    Refill:  0

## 2014-02-01 ENCOUNTER — Other Ambulatory Visit: Payer: Self-pay | Admitting: Internal Medicine

## 2014-02-07 ENCOUNTER — Encounter: Payer: Self-pay | Admitting: Internal Medicine

## 2014-02-07 ENCOUNTER — Ambulatory Visit (INDEPENDENT_AMBULATORY_CARE_PROVIDER_SITE_OTHER): Payer: Medicare Other | Admitting: Internal Medicine

## 2014-02-07 VITALS — BP 146/66 | HR 68 | Ht 68.0 in | Wt 144.8 lb

## 2014-02-07 DIAGNOSIS — I480 Paroxysmal atrial fibrillation: Secondary | ICD-10-CM

## 2014-02-07 DIAGNOSIS — Z45018 Encounter for adjustment and management of other part of cardiac pacemaker: Secondary | ICD-10-CM

## 2014-02-07 DIAGNOSIS — I255 Ischemic cardiomyopathy: Secondary | ICD-10-CM

## 2014-02-07 DIAGNOSIS — I442 Atrioventricular block, complete: Secondary | ICD-10-CM

## 2014-02-07 DIAGNOSIS — I5042 Chronic combined systolic (congestive) and diastolic (congestive) heart failure: Secondary | ICD-10-CM

## 2014-02-07 DIAGNOSIS — I10 Essential (primary) hypertension: Secondary | ICD-10-CM

## 2014-02-07 MED ORDER — LISINOPRIL 2.5 MG PO TABS: 2.5000 mg | ORAL_TABLET | Freq: Every day | ORAL | Status: DC

## 2014-02-07 NOTE — Patient Instructions (Signed)
Your physician has recommended you make the following change in your medication:  1) start lisinopril 2.5 mg once daily  Your physician recommends that you return for lab work in: 2 weeks- bmp  Remote monitoring is used to monitor your Pacemaker of ICD from home. This monitoring reduces the number of office visits required to check your device to one time per year. It allows [place] to keep an eye on the functioning of your device to ensure it is working properly. You are scheduled for a device check from home on 05/13/14. You may send your transmission at any time that day. If you have a wireless device, the transmission will be sent automatically. After your physician reviews your transmission, you will receive a postcard with your next transmission date.  Your physician wants you to follow-up in: 6 months with Dr. Caryl Comes. You will receive a reminder letter in the mail two months in advance. If you don't receive a letter, please call our office to schedule the follow-up appointment.

## 2014-02-07 NOTE — Progress Notes (Signed)
Patient Care Team: Lisabeth Pick, MD as PCP - General   HPI  [name] [name] is a 80 y.o. female Seen in followup for syncope which was in part orthostatic and probably related to orthostatic high-grade heart block. She underwent CRT P.. She has evolved device dependent.  She has paroxysmal atrial fibrillation   She does however have a history of ischemic heart disease with prior MI and bypass surgery prior PCI to the LAD and previously normal left ventricular function. However, on evaluation 4/14 echocardiogram demonstrated EF 25-30%. LHC 4/14 Proximal LAD stent patent, LIMA-LAD atretic, D1 occluded, proximal circumflex 30%, mid RCA occluded, SVG-D1 normal, SVG-OM2 normal, SVG-distal RCA normal, EF 40% with diffuse HK   Echocardiogram 2/15 demonstrated ejection fraction of 30-35%  She has been doing much much better. There is less dizziness. There is more exercise capacity. There is no edema.  She has a history of atrial fibrillation that persisted for some years after her bypass surgery   It has been asymptomatic since then and her Coumadin was discontinued . Per chart as TIA listed in the problem list; according to her daughter this was a presumptive diagnosis and was never confirmed. MRI was negative           Past Medical History  Diagnosis Date  . MELANOMA 09/06/2008  . HYPERLIPIDEMIA 10/06/2007  . HYPERTENSION 10/06/2007  . MYOCARDIAL INFARCTION, HX OF 10/06/2007  . CORONARY ARTERY DISEASE 10/06/2007    a. s/p PCI to LAD; b. s/p CABG; c. LHC 07/13/12: Proximal LAD stent patent, LIMA-LAD atretic, D1 occluded, proximal circumflex 30%, mid RCA occluded, SVG-D1 normal, SVG-OM2 normal, SVG-distal RCA normal, EF 40% with diffuse HK  . LBBB 09/06/2008  . Atrial fibrillation 10/06/2007    post op  . VARICOSE VEIN 09/29/2009  . UTERINE CANCER, HX OF 09/06/2008  . PERSONAL HX COLONIC POLYPS 10/30/2009  . Syncope   . Dizziness   . NICM (nonischemic cardiomyopathy)    Echocardiogram 07/12/12: EF 25-30%, diffuse HK, mild AI, mild MR, mild LAE  . COPD (chronic obstructive pulmonary disease)     Emphysema on 02/03/13 CXR    Past Surgical History  Procedure Laterality Date  . Oophorectomy    . Right distal pretibeal      melanoma  . Coronary angioplasty with stent placement  2009  . Coronary artery bypass graft  2004  . Abdominal hysterectomy  1988  . Cardiac catheterization  08/30/2008  . Bi-ventricular pacemaker insertion (crt-p)  02/02/2013    St. Jude, serial no. #[id number]     Current Outpatient Prescriptions  Medication Sig Dispense Refill  . albuterol (VENTOLIN HFA) 108 (90 BASE) MCG/ACT inhaler Inhale 2 puffs into the lungs every 4 (four) hours as needed.  1 Inhaler  6  . amoxicillin-clavulanate (AUGMENTIN) 875-125 MG per tablet Take 1 tablet by mouth 2 (two) times daily.  20 tablet  0  . atorvastatin (LIPITOR) 20 MG tablet TAKE 1 TABLET DAILY  90 tablet  1  . carvedilol (COREG) 3.125 MG tablet TAKE 1 TABLET BY MOUTH TWICE DAILY WITH A MEAL  60 tablet  6  . clopidogrel (PLAVIX) 75 MG tablet TAKE 1 TABLET DAILY  90 tablet  1  . escitalopram (LEXAPRO) 10 MG tablet TAKE 1 TABLET BY MOUTH EVERY DAY  30 tablet  0  . fluticasone (FLONASE) 50 MCG/ACT nasal spray Place 2 sprays into both nostrils daily as needed.      . pyridostigmine (MESTINON) 60 MG tablet  Take 0.5 tablets (30 mg total) by mouth 3 (three) times daily.  90 tablet  10   No current facility-administered medications for this visit.    Allergies  Allergen Reactions  . Nitroglycerin Other (See Comments)    sensitive to tabs which causes rapid drop in blood pressure. Is ok to use oral spray form    Review of Systems negative except from HPI and PMH  Physical Exam BP 146/66  Pulse 68  Ht 5\' 8"  (1.727 m)  Wt 144 lb 12.8 oz (65.681 kg)  BMI 22.02 kg/m2 Well developed and nourished in no acute distress HENT normal Neck supple with JVP-flat Clear Device pocket well healed; without  hematoma or erythema.  There is no tethering  Regular rate and rhythm, no murmurs or gallops Abd-soft with active BS No Clubbing cyanosis edema Skin-warm and dry A & Oriented  Grossly normal sensory and motor function  ECG demonstrates P. synchronous pacing  Assessment and  Plan  Hypertension  Complete heart block  Pacemaker-St. Jude-CRT The patient's device was interrogated.  The information was reviewed. No changes were made in the programming.     Ischemic heart myopathy with prior bypass  Without symptoms of ischemia   The overall she is doing very well. Her blood pressure being elevated not only here at home we'll add lisinopril. We have reviewed side effects.renal function 5/15 was normal

## 2014-02-08 LAB — MDC_IDC_ENUM_SESS_TYPE_INCLINIC
Battery Remaining Longevity: 98.4 mo
Brady Statistic RV Percent Paced: 99.08 %
Implantable Pulse Generator Model: 3242
Implantable Pulse Generator Serial Number: 7516165
Lead Channel Impedance Value: 450 Ohm
Lead Channel Impedance Value: 512.5 Ohm
Lead Channel Pacing Threshold Amplitude: 0.75 V
Lead Channel Pacing Threshold Amplitude: 0.75 V
Lead Channel Pacing Threshold Amplitude: 0.75 V
Lead Channel Pacing Threshold Amplitude: 1.375 V
Lead Channel Pacing Threshold Amplitude: 1.5 V
Lead Channel Pacing Threshold Pulse Width: 0.4 ms
Lead Channel Pacing Threshold Pulse Width: 0.4 ms
Lead Channel Pacing Threshold Pulse Width: 0.4 ms
Lead Channel Sensing Intrinsic Amplitude: 12 mV
Lead Channel Setting Pacing Amplitude: 2 V
Lead Channel Setting Pacing Amplitude: 2.375
Lead Channel Setting Pacing Amplitude: 2.5 V
Lead Channel Setting Pacing Pulse Width: 0.4 ms
MDC IDC MSMT BATTERY VOLTAGE: 3.01 V
MDC IDC MSMT LEADCHNL LV IMPEDANCE VALUE: 975 Ohm
MDC IDC MSMT LEADCHNL LV PACING THRESHOLD PULSEWIDTH: 0.5 ms
MDC IDC MSMT LEADCHNL RA PACING THRESHOLD PULSEWIDTH: 0.4 ms
MDC IDC MSMT LEADCHNL RA SENSING INTR AMPL: 2.3 mV
MDC IDC MSMT LEADCHNL RV PACING THRESHOLD AMPLITUDE: 0.75 V
MDC IDC MSMT LEADCHNL RV PACING THRESHOLD PULSEWIDTH: 0.4 ms
MDC IDC SESS DTM: 20151029200833
MDC IDC SET LEADCHNL LV PACING PULSEWIDTH: 0.5 ms
MDC IDC SET LEADCHNL RV SENSING SENSITIVITY: 5 mV
MDC IDC STAT BRADY RA PERCENT PACED: 13 %

## 2014-02-14 ENCOUNTER — Other Ambulatory Visit: Payer: Self-pay | Admitting: Internal Medicine

## 2014-02-21 ENCOUNTER — Other Ambulatory Visit (INDEPENDENT_AMBULATORY_CARE_PROVIDER_SITE_OTHER): Payer: Medicare Other

## 2014-02-21 DIAGNOSIS — I480 Paroxysmal atrial fibrillation: Secondary | ICD-10-CM

## 2014-02-21 LAB — BASIC METABOLIC PANEL
BUN: 12 mg/dL (ref 6–23)
CALCIUM: 8.9 mg/dL (ref 8.4–10.5)
CO2: 25 mEq/L (ref 19–32)
Chloride: 102 mEq/L (ref 96–112)
Creatinine, Ser: 0.7 mg/dL (ref 0.4–1.2)
GFR: 86.94 mL/min (ref >60.00)
GLUCOSE: 92 mg/dL (ref 70–99)
POTASSIUM: 3.9 meq/L (ref 3.5–5.1)
SODIUM: 136 meq/L (ref 135–145)

## 2014-02-23 ENCOUNTER — Other Ambulatory Visit: Payer: Self-pay | Admitting: Internal Medicine

## 2014-03-21 ENCOUNTER — Encounter (HOSPITAL_COMMUNITY): Payer: Self-pay | Admitting: Cardiology

## 2014-04-17 ENCOUNTER — Ambulatory Visit (INDEPENDENT_AMBULATORY_CARE_PROVIDER_SITE_OTHER): Payer: Medicare Other | Admitting: Family Medicine

## 2014-04-17 ENCOUNTER — Encounter: Payer: Self-pay | Admitting: Family Medicine

## 2014-04-17 VITALS — BP 142/70 | Temp 98.2°F | Wt 143.0 lb

## 2014-04-17 DIAGNOSIS — I10 Essential (primary) hypertension: Secondary | ICD-10-CM

## 2014-04-17 DIAGNOSIS — G3184 Mild cognitive impairment, so stated: Secondary | ICD-10-CM | POA: Insufficient documentation

## 2014-04-17 DIAGNOSIS — R413 Other amnesia: Secondary | ICD-10-CM

## 2014-04-17 DIAGNOSIS — F03918 Unspecified dementia, unspecified severity, with other behavioral disturbance: Secondary | ICD-10-CM | POA: Insufficient documentation

## 2014-04-17 DIAGNOSIS — I480 Paroxysmal atrial fibrillation: Secondary | ICD-10-CM

## 2014-04-17 DIAGNOSIS — F329 Major depressive disorder, single episode, unspecified: Secondary | ICD-10-CM

## 2014-04-17 DIAGNOSIS — H9193 Unspecified hearing loss, bilateral: Secondary | ICD-10-CM

## 2014-04-17 DIAGNOSIS — H9190 Unspecified hearing loss, unspecified ear: Secondary | ICD-10-CM | POA: Insufficient documentation

## 2014-04-17 DIAGNOSIS — I2510 Atherosclerotic heart disease of native coronary artery without angina pectoris: Secondary | ICD-10-CM | POA: Insufficient documentation

## 2014-04-17 DIAGNOSIS — J309 Allergic rhinitis, unspecified: Secondary | ICD-10-CM | POA: Insufficient documentation

## 2014-04-17 DIAGNOSIS — F32A Depression, unspecified: Secondary | ICD-10-CM

## 2014-04-17 DIAGNOSIS — F0390 Unspecified dementia without behavioral disturbance: Secondary | ICD-10-CM | POA: Insufficient documentation

## 2014-04-17 NOTE — Assessment & Plan Note (Signed)
Normal neurological exam. reportedly normal MMSE in recent years. PHQ 9 of 3 and doubt depression as cause. I had a long discussion with patient about working this up as well as calling patient's daughter to discuss. We ultimately decided to have patient come back to discuss dementia eval with MMSE and potential reversible causes work up including CBC, CMET, RPR, HIV, b12, tsh, as well as MRI. With atrial fibrillation on problem list and no anticoagulation-vascular dementia would be a concern but hasn't had stepwise worsening and atrial fibrillation has not been recurrent per patient/cards notes.

## 2014-04-17 NOTE — Patient Instructions (Addendum)
Referred you for evaluation of hearing loss to audiology. If you don't hear from [place] within a week, give [place] a call.   Let's see each other back before January 24th (not including January 11th) for a more thorough evaluation of your memory loss. Your daughter would like to come with you at that time.   Normal neurological exam today. Do not think depression is the cause of your memory loss. We will evaluate further next visit.

## 2014-04-17 NOTE — Progress Notes (Signed)
[name] Reddish, MD Phone: [phone]  Subjective:  Patient presents today to establish care with me as their new primary care provider. Patient was formerly a patient of Dr. Leanne Chang. Chief complaint-noted.   Memory Loss Depression as cause? Short term memory loss and forgetfulness. Like walking into room and forgetting why she went into a room. Perhaps slowly worsening with time but has had it for a while.  Is able to eventually remember. Has trouble with names but this has always been an issue. Has bought some prevagin from TV but has not started (memory supplement).   phq9 of 3 with no anhedonia. Feeling down from social isolation.   Additionally Patient gave me a letter from her Daughter [name] [name] who is a Designer, jewellery (husband also pharmacist) and they expressed concerns of memory loss and potential need for medication. Mention MMSE when at [facility] or [facility] a few years ago which was reportedly normal). Daughter also mentions hearing loss.   ROS- no history of falls, no extremity weakness, no stroke history.   Hearing loss 1-2 years. Seems to be progressive O: TM normal and not obscured Ros- occasional tinnitus, no vertigo The following were reviewed and entered/updated in epic: Past Medical History  Diagnosis Date  . MELANOMA 09/06/2008  . HYPERLIPIDEMIA 10/06/2007  . HYPERTENSION 10/06/2007  . MYOCARDIAL INFARCTION, HX OF 10/06/2007  . CORONARY ARTERY DISEASE 10/06/2007    a. s/p PCI to LAD; b. s/p CABG; c. LHC 07/13/12: Proximal LAD stent patent, LIMA-LAD atretic, D1 occluded, proximal circumflex 30%, mid RCA occluded, SVG-D1 normal, SVG-OM2 normal, SVG-distal RCA normal, EF 40% with diffuse HK  . LBBB 09/06/2008  . Atrial fibrillation 10/06/2007    post op  . VARICOSE VEIN 09/29/2009  . UTERINE CANCER, HX OF 09/06/2008  . PERSONAL HX COLONIC POLYPS 10/30/2009  . Syncope   . Dizziness   . NICM (nonischemic cardiomyopathy)     Echocardiogram 07/12/12: EF 25-30%, diffuse HK, mild  AI, mild MR, mild LAE  . COPD (chronic obstructive pulmonary disease)     Emphysema on 02/03/13 CXR  . History of colonic polyps 10/30/2009    No polyps in 2011. No repeat.     Patient Active Problem List   Diagnosis Date Noted  . CAD (coronary artery disease) 04/17/2014    Priority: High  . Pacemaker -CRT-St. Jude 05/21/2013    Priority: High  . Atrioventricular block, complete 03/08/2013    Priority: High  . Paroxysmal atrial fibrillation 02/03/2013    Priority: High  . Chronic combined systolic and diastolic congestive heart failure 10/10/2012    Priority: High  . Ischemic cardiomyopathy  EF 40% cath 4/14 10/06/2007    Priority: High  . Memory loss 04/17/2014    Priority: Medium  . Asthma, intrinsic 02/03/2013    Priority: Medium  . Depression 09/19/2012    Priority: Medium  . Melanoma of skin 09/06/2008    Priority: Medium  . Uterine cancer 09/06/2008    Priority: Medium  . Hyperlipidemia 10/06/2007    Priority: Medium  . Essential hypertension 10/06/2007    Priority: Medium  . Allergic rhinitis 04/17/2014    Priority: Low  . Varicose veins 09/29/2009    Priority: Low  . LBBB (left bundle branch block) 09/06/2008    Priority: Low   Past Surgical History  Procedure Laterality Date  . Oophorectomy    . Right distal pretibeal      melanoma  . Coronary angioplasty with stent placement  2009  . Coronary artery  bypass graft  2004  . Abdominal hysterectomy  1988  . Cardiac catheterization  08/30/2008  . Bi-ventricular pacemaker insertion (crt-p)  02/02/2013    St. Jude, serial no. #[id number]   . Left heart catheterization with coronary angiogram Bilateral 07/13/2012    Procedure: LEFT HEART CATHETERIZATION WITH CORONARY ANGIOGRAM;  Surgeon: Peter M Martinique, MD;  Location[facility] CATH LAB;  Service: Cardiovascular;  Laterality: Bilateral;  . Permanent pacemaker insertion N/A 02/02/2013    Procedure: PERMANENT PACEMAKER INSERTION;  Surgeon: Evans Lance, MD;  Location: [facility]  CATH LAB;  Service: Cardiovascular;  Laterality: N/A;    Family History  Problem Relation Age of Onset  . Stroke Mother   . Heart attack Father   . Colon cancer Neg Hx     Medications- reviewed and updated Current Outpatient Prescriptions  Medication Sig Dispense Refill  . atorvastatin (LIPITOR) 20 MG tablet TAKE 1 TABLET DAILY 90 tablet 1  . carvedilol (COREG) 3.125 MG tablet TAKE 1 TABLET BY MOUTH TWICE DAILY WITH A MEAL 60 tablet 6  . clopidogrel (PLAVIX) 75 MG tablet TAKE 1 TABLET DAILY 90 tablet 1  . escitalopram (LEXAPRO) 10 MG tablet TAKE 1 TABLET BY MOUTH EVERY DAY 30 tablet 2  . fluticasone (FLONASE) 50 MCG/ACT nasal spray Place 2 sprays into both nostrils daily as needed.    Marland Kitchen lisinopril (PRINIVIL,ZESTRIL) 2.5 MG tablet Take 1 tablet (2.5 mg total) by mouth daily. 30 tablet 3  . pyridostigmine (MESTINON) 60 MG tablet Take 0.5 tablets (30 mg total) by mouth 3 (three) times daily. 90 tablet 10  . albuterol (VENTOLIN HFA) 108 (90 BASE) MCG/ACT inhaler Inhale 2 puffs into the lungs every 4 (four) hours as needed. (Patient not taking: Reported on 04/17/2014) 1 Inhaler 6   No current facility-administered medications for this visit.    Allergies-reviewed and updated Allergies  Allergen Reactions  . Nitroglycerin Other (See Comments)    sensitive to tabs which causes rapid drop in blood pressure. Is ok to use oral spray [name]    History   Social History  . Marital Status: Widowed    Spouse Name: N/A    Number of Children: N/A  . Years of Education: N/A   Social History Main Topics  . Smoking status: Never Smoker   . Smokeless tobacco: Never Used  . Alcohol Use: Yes     Comment: daily gin or wine  . Drug Use: No  . Sexual Activity: None   Other Topics Concern  . None   Social History Narrative   Widowed. Lives alone with cat. 3 children. 6 grandkids   Daughter  (NP) and son in Sports coach (pharmacist-teaches at [employer]) that live close and help out.       Drives in the  daytime, shops for herself, cleaning-has someone over to clean due to the intermittent lightheadedness, bathing      Retired from physical education.           ROS--See HPI   Objective: BP 142/70 mmHg  Temp(Src) 98.2 F (36.8 C)  Wt 143 lb (64.864 kg) Gen: NAD, resting comfortably in chair HEENT: Mucous membranes are moist. Oropharynx normal.  CV: RRR no murmurs rubs or gallops Lungs: CTAB no crackles, wheeze, rhonchi Abdomen: soft/nontender/nondistended/normal bowel sounds.  Ext: no edema Skin: warm, dry Neuro: CN II-XII intact with exception hearing loss to finger rub bilaterally but R >L, sensation and reflexes normal throughout, 5/5 muscle strength in bilateral upper and lower extremities. Normal finger to nose. Normal  rapid alternating movements. Normal romberg.  Did not test memory   Assessment/Plan:  Memory loss Normal neurological exam. reportedly normal MMSE in recent years. PHQ 9 of 3 and doubt depression as cause. I had a long discussion with patient about working this up as well as calling patient's daughter to discuss. We ultimately decided to have patient come back to discuss dementia eval with MMSE and potential reversible causes work up including CBC, CMET, RPR, HIV, b12, tsh, as well as MRI. With atrial fibrillation on problem list and no anticoagulation-vascular dementia would be a concern but hasn't had stepwise worsening and atrial fibrillation has not been recurrent per patient/cards notes.   Hearing loss Cannot hear finger rub on right ear and minimal on left-refer to audiology.   BP mildly elevated-monitor but with history of ortho stasis managed by cardiology not likely to make changes. May need to decrease pyridostigmine-appears lisinopril was added to assist BP by Dr. Caryl Comes. Discuss this regimen with daughter/patient at follow up.   Also ? Statin related memory loss (rare but possible).   >50% of 30 minute office visit was spent on counseling (patient  about potential causes of dementia, calling daughter to discuss plan) and coordination of care   Orders Placed This Encounter  Procedures  . Ambulatory referral to Audiology    Referral Priority:  Routine    Referral Type:  Audiology Exam    Referral Reason:  Specialty Services Required    Number of Visits Requested:  1

## 2014-04-17 NOTE — Assessment & Plan Note (Signed)
Cannot hear finger rub on right ear and minimal on left-refer to audiology.

## 2014-04-19 ENCOUNTER — Telehealth: Payer: Self-pay | Admitting: Family Medicine

## 2014-04-19 NOTE — Telephone Encounter (Signed)
Pt want to know when her las flu shot was , pt would like a call

## 2014-04-19 NOTE — Telephone Encounter (Signed)
Pt.notified

## 2014-04-26 ENCOUNTER — Ambulatory Visit (INDEPENDENT_AMBULATORY_CARE_PROVIDER_SITE_OTHER): Payer: Medicare Other | Admitting: Family Medicine

## 2014-04-26 ENCOUNTER — Encounter: Payer: Self-pay | Admitting: Family Medicine

## 2014-04-26 VITALS — BP 138/60 | Temp 98.1°F | Wt 144.0 lb

## 2014-04-26 DIAGNOSIS — R413 Other amnesia: Secondary | ICD-10-CM

## 2014-04-26 DIAGNOSIS — E785 Hyperlipidemia, unspecified: Secondary | ICD-10-CM

## 2014-04-26 NOTE — Patient Instructions (Signed)
Come back for fasting labs to evaluate potential causes of memory loss as well as cholesterol. May restart atorvastatin since stopping it did not seem to help.   We did not opt for aricept at this time but would be happy to hear your family's thoughts after thinking it over. My preference would be a recheck 6 months to see if progression and reconsider at that time.

## 2014-04-26 NOTE — Progress Notes (Signed)
Garret Reddish, MD Phone: [phone]  Subjective:   [name] [name] is a 81 y.o. year old very pleasant female patient who presents with the following:  Memory Loss and concern for dementia Daughter [name] who is a NP accompanies patient. Together they describe 2-3 years of memory issues worsening in the last 6-12 monhts. Seems to be a gradual decline with no stepwise losses. Trouble with word searching, time orientation. No difficulty driving or getting lost. Has trouble with driving. Family had discussed atorvastatin with Dr. Caryl Comes and they took 3 months off the medication with no improvement in memory but also no clear worsening.   ROS- no headaches or blurry vision or extremity weakness  Family history- no known history dementia Social history- no drugs, never  smoker, occasional alcohol. Lives with alone. University Degree in PE from [place].   Past Medical History- Patient Active Problem List   Diagnosis Date Noted  . CAD (coronary artery disease) 04/17/2014    Priority: High  . Pacemaker -CRT-St. Jude 05/21/2013    Priority: High  . Atrioventricular block, complete 03/08/2013    Priority: High  . Paroxysmal atrial fibrillation 02/03/2013    Priority: High  . Chronic combined systolic and diastolic congestive heart failure 10/10/2012    Priority: High  . Ischemic cardiomyopathy  EF 30% cath 4/14 with stent 10/06/2007    Priority: High  . Memory loss 04/17/2014    Priority: Medium  . Asthma, intrinsic 02/03/2013    Priority: Medium  . Depression 09/19/2012    Priority: Medium  . Melanoma of skin 09/06/2008    Priority: Medium  . Uterine cancer 09/06/2008    Priority: Medium  . Hyperlipidemia 10/06/2007    Priority: Medium  . Essential hypertension 10/06/2007    Priority: Medium  . Allergic rhinitis 04/17/2014    Priority: Low  . Hearing loss 04/17/2014    Priority: Low  . Varicose veins 09/29/2009    Priority: Low  . LBBB (left bundle branch block) 09/06/2008      Priority: Low   Medications- reviewed and updated Current Outpatient Prescriptions  Medication Sig Dispense Refill  . atorvastatin (LIPITOR) 20 MG tablet TAKE 1 TABLET DAILY 90 tablet 1  . carvedilol (COREG) 3.125 MG tablet TAKE 1 TABLET BY MOUTH TWICE DAILY WITH A MEAL 60 tablet 6  . clopidogrel (PLAVIX) 75 MG tablet TAKE 1 TABLET DAILY 90 tablet 1  . escitalopram (LEXAPRO) 10 MG tablet TAKE 1 TABLET BY MOUTH EVERY DAY 30 tablet 2  . fluticasone (FLONASE) 50 MCG/ACT nasal spray Place 2 sprays into both nostrils daily as needed.    Marland Kitchen lisinopril (PRINIVIL,ZESTRIL) 2.5 MG tablet Take 1 tablet (2.5 mg total) by mouth daily. 30 tablet 3  . pyridostigmine (MESTINON) 60 MG tablet Take 0.5 tablets (30 mg total) by mouth 3 (three) times daily. 90 tablet 10  . albuterol (VENTOLIN HFA) 108 (90 BASE) MCG/ACT inhaler Inhale 2 puffs into the lungs every 4 (four) hours as needed. (Patient not taking: Reported on 04/17/2014) 1 Inhaler 6   No current facility-administered medications for this visit.    Objective: BP 138/60 mmHg  Temp(Src) 98.1 F (36.7 C)  Wt 144 lb (65.318 kg) Gen: NAD, resting comfortably   MMSE 25/30 (loses point for date, name of building-[place], 2 points for world backwords, and 1 point for 3 word recall)  Assessment/Plan:  Memory loss We discussed at this point cannot label as dementia but there is at least enough information with MMSE 25/30 to call  this mild cognitive impairment. Normal neuro exam last visit except some hearing loss. Has already had an MRI in 2014 and was already having memory issues and doubt vascular dementia so will not repeat. Labs including cbc, cmet, tsh, b12, folate, hiv, rpr on return. Given off atorvastatin, check lipids and potentially restart-trial off did not seem to help memory. Do not see other medications that can be removed. Depression not thought to be cause.   Follow up MMSE in 6 months, if progressing, would plan on starting aricept for  likely alzheimer's dementia.    return sooner if worsening memory issues.   Future fasting labs Orders Placed This Encounter  Procedures  . CBC    [place]    Standing Status: Future     Number of Occurrences:      Standing Expiration Date: 04/27/2015  . Comprehensive metabolic panel    [place]    Standing Status: Future     Number of Occurrences:      Standing Expiration Date: 04/27/2015    Order Specific Question:  Has the patient fasted?    Answer:  No  . TSH    [place]    Standing Status: Future     Number of Occurrences:      Standing Expiration Date: 04/27/2015  . Vitamin B12    Standing Status: Future     Number of Occurrences:      Standing Expiration Date: 04/27/2015  . Folate    Standing Status: Future     Number of Occurrences:      Standing Expiration Date: 04/27/2015  . RPR    [facility]    Standing Status: Future     Number of Occurrences:      Standing Expiration Date: 04/27/2015  . HIV antibody    [facility]    Standing Status: Future     Number of Occurrences:      Standing Expiration Date: 04/27/2015  . Lipid panel    [place]    Standing Status: Future     Number of Occurrences:      Standing Expiration Date: 04/27/2015    Order Specific Question:  Has the patient fasted?    Answer:  No

## 2014-04-27 NOTE — Assessment & Plan Note (Signed)
We discussed at this point cannot label as dementia but there is at least enough information with MMSE 25/30 to call this mild cognitive impairment. Normal neuro exam last visit except some hearing loss. Has already had an MRI in 2014 and was already having memory issues and doubt vascular dementia so will not repeat. Labs including cbc, cmet, tsh, b12, folate, hiv, rpr on return. Given off atorvastatin, check lipids and potentially restart-trial off did not seem to help memory. Do not see other medications that can be removed. Depression not thought to be cause.   Follow up MMSE in 6 months, if progressing, would plan on starting aricept for likely alzheimer's dementia.

## 2014-04-30 ENCOUNTER — Other Ambulatory Visit (INDEPENDENT_AMBULATORY_CARE_PROVIDER_SITE_OTHER): Payer: Medicare Other

## 2014-04-30 DIAGNOSIS — E785 Hyperlipidemia, unspecified: Secondary | ICD-10-CM

## 2014-04-30 DIAGNOSIS — R413 Other amnesia: Secondary | ICD-10-CM

## 2014-04-30 LAB — COMPREHENSIVE METABOLIC PANEL
ALT: 10 U/L (ref 0–35)
AST: 17 U/L (ref 0–37)
Albumin: 4 g/dL (ref 3.5–5.2)
Alkaline Phosphatase: 53 U/L (ref 39–117)
BILIRUBIN TOTAL: 0.7 mg/dL (ref 0.2–1.2)
BUN: 11 mg/dL (ref 6–23)
CHLORIDE: 102 meq/L (ref 96–112)
CO2: 31 mEq/L (ref 19–32)
Calcium: 9 mg/dL (ref 8.4–10.5)
Creatinine, Ser: 0.77 mg/dL (ref 0.40–1.20)
GFR: 76.56 mL/min (ref >60.00)
Glucose, Bld: 96 mg/dL (ref 70–99)
Potassium: 4 mEq/L (ref 3.5–5.1)
Sodium: 138 mEq/L (ref 135–145)
Total Protein: 5.9 g/dL — ABNORMAL LOW (ref 6.0–8.3)

## 2014-04-30 LAB — LIPID PANEL
CHOL/HDL RATIO: 3
CHOLESTEROL: 197 mg/dL (ref 0–200)
HDL: 58.2 mg/dL (ref >39.00)
LDL CALC: 118 mg/dL — AB (ref 0–99)
NonHDL: 138.8
Triglycerides: 105 mg/dL (ref 0.0–149.0)
VLDL: 21 mg/dL (ref 0.0–40.0)

## 2014-04-30 LAB — CBC
HCT: 42.8 % (ref 36.0–46.0)
Hemoglobin: 13.9 g/dL (ref 12.0–15.0)
MCHC: 32.5 g/dL (ref 30.0–36.0)
MCV: 90.8 fl (ref 78.0–100.0)
Platelets: 130 10*3/uL — ABNORMAL LOW (ref 150.0–400.0)
RBC: 4.71 Mil/uL (ref 3.87–5.11)
RDW: 14.9 % (ref 11.5–15.5)
WBC: 5.2 10*3/uL (ref 4.0–10.5)

## 2014-04-30 LAB — FOLATE: FOLATE: 10.4 ng/mL (ref >5.9)

## 2014-04-30 LAB — VITAMIN B12: Vitamin B-12: 276 pg/mL (ref 211–911)

## 2014-04-30 LAB — TSH: TSH: 2.59 u[IU]/mL (ref 0.35–4.50)

## 2014-05-01 LAB — RPR

## 2014-05-01 LAB — HIV ANTIBODY (ROUTINE TESTING W REFLEX): HIV 1&2 Ab, 4th Generation: NONREACTIVE

## 2014-05-03 ENCOUNTER — Ambulatory Visit: Payer: Medicare Other | Admitting: Family Medicine

## 2014-05-06 ENCOUNTER — Telehealth: Payer: Self-pay | Admitting: Cardiology

## 2014-05-06 NOTE — Telephone Encounter (Signed)
Pt called and stated that she lost her Pacemaker ID card. I informed pt that  I would call [employer] and order her a new one. Pt aware that new ID card will arrive within 2-3 weeks.

## 2014-05-13 ENCOUNTER — Ambulatory Visit (INDEPENDENT_AMBULATORY_CARE_PROVIDER_SITE_OTHER): Payer: Medicare Other | Admitting: *Deleted

## 2014-05-13 ENCOUNTER — Encounter: Payer: Self-pay | Admitting: Internal Medicine

## 2014-05-13 ENCOUNTER — Telehealth: Payer: Self-pay | Admitting: Cardiology

## 2014-05-13 DIAGNOSIS — I442 Atrioventricular block, complete: Secondary | ICD-10-CM

## 2014-05-13 LAB — MDC_IDC_ENUM_SESS_TYPE_REMOTE
Battery Remaining Longevity: 94 mo
Battery Remaining Percentage: 95.5 %
Brady Statistic AP VP Percent: 19 %
Brady Statistic AS VS Percent: 1 %
Date Time Interrogation Session: 20160201191328
Implantable Pulse Generator Serial Number: 7516165
Lead Channel Impedance Value: 410 Ohm
Lead Channel Impedance Value: 480 Ohm
Lead Channel Pacing Threshold Amplitude: 0.75 V
Lead Channel Pacing Threshold Amplitude: 0.75 V
Lead Channel Pacing Threshold Amplitude: 1.625 V
Lead Channel Pacing Threshold Pulse Width: 0.4 ms
Lead Channel Setting Pacing Amplitude: 2.5 V
Lead Channel Setting Sensing Sensitivity: 5 mV
MDC IDC MSMT BATTERY VOLTAGE: 2.99 V
MDC IDC MSMT LEADCHNL LV IMPEDANCE VALUE: 900 Ohm
MDC IDC MSMT LEADCHNL LV PACING THRESHOLD PULSEWIDTH: 0.5 ms
MDC IDC MSMT LEADCHNL RA PACING THRESHOLD PULSEWIDTH: 0.4 ms
MDC IDC MSMT LEADCHNL RA SENSING INTR AMPL: 1.9 mV
MDC IDC MSMT LEADCHNL RV SENSING INTR AMPL: 12 mV
MDC IDC PG MODEL: 3242
MDC IDC SET LEADCHNL LV PACING AMPLITUDE: 2.625
MDC IDC SET LEADCHNL LV PACING PULSEWIDTH: 0.5 ms
MDC IDC SET LEADCHNL RA PACING AMPLITUDE: 2 V
MDC IDC SET LEADCHNL RV PACING PULSEWIDTH: 0.4 ms
MDC IDC STAT BRADY AP VS PERCENT: 1 %
MDC IDC STAT BRADY AS VP PERCENT: 80 %
MDC IDC STAT BRADY RA PERCENT PACED: 18 %

## 2014-05-13 NOTE — Telephone Encounter (Signed)
Spoke with pt and reminded pt of remote transmission that is due today. Pt verbalized understanding.   

## 2014-05-13 NOTE — Progress Notes (Signed)
Remote pacemaker transmission.   

## 2014-05-19 ENCOUNTER — Other Ambulatory Visit: Payer: Self-pay | Admitting: Internal Medicine

## 2014-05-21 ENCOUNTER — Encounter: Payer: Self-pay | Admitting: Cardiology

## 2014-06-04 ENCOUNTER — Other Ambulatory Visit: Payer: Self-pay | Admitting: Internal Medicine

## 2014-06-05 ENCOUNTER — Other Ambulatory Visit: Payer: Self-pay | Admitting: Internal Medicine

## 2014-06-05 ENCOUNTER — Other Ambulatory Visit: Payer: Self-pay

## 2014-06-05 DIAGNOSIS — I480 Paroxysmal atrial fibrillation: Secondary | ICD-10-CM

## 2014-06-05 MED ORDER — LISINOPRIL 2.5 MG PO TABS: 2.5000 mg | ORAL_TABLET | Freq: Every day | ORAL | Status: DC

## 2014-06-21 ENCOUNTER — Telehealth: Payer: Self-pay | Admitting: Family Medicine

## 2014-06-21 MED ORDER — ESCITALOPRAM OXALATE 10 MG PO TABS: 10.0000 mg | ORAL_TABLET | Freq: Every day | ORAL | Status: DC

## 2014-06-21 NOTE — Telephone Encounter (Signed)
Patient need a re-fill on escitalopram (LEXAPRO) 10 MG tablet sent to [id number] - [place], [address] AT [address].

## 2014-06-21 NOTE — Telephone Encounter (Signed)
Medication refilled

## 2014-07-08 ENCOUNTER — Encounter: Payer: Self-pay | Admitting: Family Medicine

## 2014-07-08 ENCOUNTER — Telehealth: Payer: Self-pay | Admitting: *Deleted

## 2014-07-08 ENCOUNTER — Ambulatory Visit (INDEPENDENT_AMBULATORY_CARE_PROVIDER_SITE_OTHER): Payer: Medicare Other | Admitting: Family Medicine

## 2014-07-08 VITALS — BP 132/84 | HR 84 | Resp 14 | Wt 148.0 lb

## 2014-07-08 DIAGNOSIS — S51801A Unspecified open wound of right forearm, initial encounter: Secondary | ICD-10-CM

## 2014-07-08 DIAGNOSIS — F329 Major depressive disorder, single episode, unspecified: Secondary | ICD-10-CM

## 2014-07-08 DIAGNOSIS — W5501XA Bitten by cat, initial encounter: Secondary | ICD-10-CM | POA: Diagnosis not present

## 2014-07-08 DIAGNOSIS — F32A Depression, unspecified: Secondary | ICD-10-CM

## 2014-07-08 MED ORDER — AMOXICILLIN-POT CLAVULANATE 875-125 MG PO TABS: 1.0000 | ORAL_TABLET | Freq: Two times a day (BID) | ORAL | Status: DC

## 2014-07-08 NOTE — Telephone Encounter (Signed)
Is this bite reportable?

## 2014-07-08 NOTE — Telephone Encounter (Signed)
Dr Sharlet Salina called from [place] stating the pt wants to have her cat euthanised and he wanted to know if the cat bite was reportable or non-reportable?  Please call his cell number at [phone].

## 2014-07-08 NOTE — Patient Instructions (Signed)
Cat Bite-these can be deep and cause infection. Treat with augmentin x 7 days.   I am sorry that you will be having to give up your cat but I think it is the safest thing for you.   I know this will be tough on you- I encourage you to call and meet with our psychologist Dr. Glennon Hamilton

## 2014-07-08 NOTE — Telephone Encounter (Signed)
Spoke with Dr. Sharlet Salina. Cat is indoors only. No potential rabies exposure. Has been an aggressive cat for over a year with no worsening and no death and rabies very unlikely. Do not think this needs to be reported and Cat can be euthanized and does not need 10 day waiting period.

## 2014-07-08 NOTE — Progress Notes (Signed)
Garret Reddish, MD Phone: [phone]  Subjective:   [name] [name] is a 81 y.o. year old very pleasant female patient who presents with the following:  Cat Bite -Cat is indoors. No rabies exposure possible. Intermittently cat will want to play and seems to get "aggressive in the face" and will come bite patient. Has happened before requiring antibiotics. This time bit on leg on Saturday and today on right forearm. Left leg seemed to be healing well with neosporin alone with perhaps mild erythema around the spot of the bite but no warmth and minimal pain. Right arm tore the skin-patient did not wash out so we irrigated under the sink for several minutes during visit. There is some bruising in about 5 cm area and central cut from where teeth entered.   Depression-generally well controlled on lexapro. Admits she thinks this upcoming period will be difficult with her as she is putting cat down today hopefully.   ROS- no fever/chills/nausea/vomiting.   Past Medical History- Patient Active Problem List   Diagnosis Date Noted  . CAD (coronary artery disease) 04/17/2014    Priority: High  . Pacemaker -CRT-[employer] 05/21/2013    Priority: High  . Atrioventricular block, complete 03/08/2013    Priority: High  . Paroxysmal atrial fibrillation 02/03/2013    Priority: High  . Chronic combined systolic and diastolic congestive heart failure 10/10/2012    Priority: High  . Ischemic cardiomyopathy  EF 30% cath 4/14 with stent 10/06/2007    Priority: High  . Memory loss 04/17/2014    Priority: Medium  . Asthma, intrinsic 02/03/2013    Priority: Medium  . Depression 09/19/2012    Priority: Medium  . Melanoma of skin 09/06/2008    Priority: Medium  . Uterine cancer 09/06/2008    Priority: Medium  . Hyperlipidemia 10/06/2007    Priority: Medium  . Essential hypertension 10/06/2007    Priority: Medium  . Allergic rhinitis 04/17/2014    Priority: Low  . Hearing loss 04/17/2014    Priority:  Low  . Varicose veins 09/29/2009    Priority: Low  . LBBB (left bundle branch block) 09/06/2008    Priority: Low   Medications- reviewed and updated Current Outpatient Prescriptions  Medication Sig Dispense Refill  . albuterol (VENTOLIN HFA) 108 (90 BASE) MCG/ACT inhaler Inhale 2 puffs into the lungs every 4 (four) hours as needed. 1 Inhaler 6  . atorvastatin (LIPITOR) 20 MG tablet TAKE 1 TABLET DAILY 90 tablet 1  . carvedilol (COREG) 3.125 MG tablet TAKE 1 TABLET BY MOUTH TWICE DAILY WITH MEALS 60 tablet 1  . clopidogrel (PLAVIX) 75 MG tablet TAKE 1 TABLET DAILY 90 tablet 1  . escitalopram (LEXAPRO) 10 MG tablet Take 1 tablet (10 mg total) by mouth daily. 30 tablet 3  . fluticasone (FLONASE) 50 MCG/ACT nasal spray Place 2 sprays into both nostrils daily as needed.    Marland Kitchen lisinopril (PRINIVIL,ZESTRIL) 2.5 MG tablet Take 1 tablet (2.5 mg total) by mouth daily. 30 tablet 3  . pyridostigmine (MESTINON) 60 MG tablet Take 0.5 tablets (30 mg total) by mouth 3 (three) times daily. 90 tablet 10  . amoxicillin-clavulanate (AUGMENTIN) 875-125 MG per tablet Take 1 tablet by mouth 2 (two) times daily. 14 tablet 0   No current facility-administered medications for this visit.    Objective: BP 132/84 mmHg  Pulse 84  Resp 14  Wt 148 lb (67.132 kg) Gen: NAD, resting comfortably  Skin: 2 cm healing laceration left lower shin. Slight erythema surrounding without  brusiing Right arm with 2 cm laceration almost 2x2x2 with skin flap pulled back. Thin skin (laceration repair likely little benefit). Some surrounding bruising up to 5 cm x 5 cm   2+ PT and radial pulse  Intact distal sensation.   Psych: tearful when discussing cat.   Assessment/Plan:  Cat Bite Augmentin x 7 days given cat bite Finally ready to put cat down-advised at last cat bite about 6 months ago.  This is the last in the house living link to her husband and it is certainly tough on her. i asked if she would like to meet with  counselor or pscyhologist and she agrees. Referred to Dr. Glennon Hamilton for counseling.  Return precautions advised both for depression and for the cat bite wound.   Meds ordered this encounter  Medications  . amoxicillin-clavulanate (AUGMENTIN) 875-125 MG per tablet    Sig: Take 1 tablet by mouth 2 (two) times daily.    Dispense:  14 tablet    Refill:  0

## 2014-07-24 ENCOUNTER — Other Ambulatory Visit: Payer: Self-pay

## 2014-07-24 MED ORDER — CLOPIDOGREL BISULFATE 75 MG PO TABS: 75.0000 mg | ORAL_TABLET | Freq: Every day | ORAL | Status: DC

## 2014-07-31 ENCOUNTER — Other Ambulatory Visit: Payer: Self-pay | Admitting: Internal Medicine

## 2014-07-31 ENCOUNTER — Other Ambulatory Visit: Payer: Self-pay

## 2014-07-31 MED ORDER — CARVEDILOL 3.125 MG PO TABS: 3.1250 mg | ORAL_TABLET | Freq: Two times a day (BID) | ORAL | Status: DC

## 2014-08-15 ENCOUNTER — Encounter: Payer: Self-pay | Admitting: Emergency Medicine

## 2014-08-15 ENCOUNTER — Ambulatory Visit (INDEPENDENT_AMBULATORY_CARE_PROVIDER_SITE_OTHER): Payer: Medicare Other | Admitting: Emergency Medicine

## 2014-08-15 VITALS — BP 124/72 | HR 79 | Ht 67.0 in | Wt 144.0 lb

## 2014-08-15 DIAGNOSIS — J4520 Mild intermittent asthma, uncomplicated: Secondary | ICD-10-CM | POA: Diagnosis not present

## 2014-08-15 DIAGNOSIS — J302 Other seasonal allergic rhinitis: Secondary | ICD-10-CM

## 2014-08-15 MED ORDER — FLUTICASONE PROPIONATE 50 MCG/ACT NA SUSP: 2.0000 | Freq: Every day | NASAL | Status: DC

## 2014-08-15 NOTE — Assessment & Plan Note (Signed)
Recommended that she restart her Zyrtec is unknown nasal spray, 2 sprays each nostril daily during the spring time. We will see each other before next spring in order to plan therapy before she has a significant exposure.

## 2014-08-15 NOTE — Patient Instructions (Signed)
Please continue to have your albuterol available to use 2 puffs as needed Restart your fluticasone nasal spray during the Spring. 2 puffs each nostril daily.  Follow with Dr Lamonte Sakai in 6 months or sooner if you have any problems

## 2014-08-15 NOTE — Progress Notes (Signed)
Subjective:    Patient ID: [name] [name], female    DOB: 1931-08-18, 81 y.o.   MRN: [id number]  Asthma She complains of cough, shortness of breath and wheezing. Associated symptoms include postnasal drip. Pertinent negatives include no ear pain, fever, headaches, rhinorrhea, sneezing, sore throat or trouble swallowing. Her past medical history is significant for asthma.   81 yo woman, never smoker, hx of CAD, LBBB, A Fib, non-ischemic CM. She is referred for possible obstructive lung disease. She had a CXR that showed suspected hyperinflation. She has had a bit more trouble with allergy season causing persistent cough, mucous. She also has had URI's over the last 2 years - last longer and more symptomatic. PFT done 09/18/13 > moderate AFL with + BD response. Normal TLC. Decreased DLCO.   12/20/13 -- Follow up visit for dyspnea, moderate obstruction on PFT. Last time we did a trial of SABA before exercise to see if she would benefit > has n't used very frequently but she has noticed an improvement in her exercise tolerance. Rare wheeze w exertion.   08/15/14 -- follow-up visit for obstructive lung disease and associated exertional dyspnea. Suspect a component of long-standing but subclinical asthma that has finally caused her symptoms. She also has a history of coronary disease atrial fibrillation and an associated cardiomyopathy. She has used ventolin rarely, but also notes that she doesn't exert heavily. Has used it before walks.    Review of Systems  Constitutional: Negative for fever and unexpected weight change.  HENT: Positive for postnasal drip. Negative for congestion, dental problem, ear pain, nosebleeds, rhinorrhea, sinus pressure, sneezing, sore throat and trouble swallowing.   Eyes: Negative for redness and itching.  Respiratory: Positive for cough, shortness of breath and wheezing. Negative for chest tightness.   Cardiovascular: Positive for palpitations. Negative for leg swelling.    Gastrointestinal: Negative for nausea and vomiting.  Genitourinary: Negative for dysuria.  Musculoskeletal: Negative for joint swelling.  Skin: Negative for rash.  Neurological: Negative for headaches.  Hematological: Does not bruise/bleed easily.  Psychiatric/Behavioral: Negative for dysphoric mood. The patient is not nervous/anxious.        Objective:   Physical Exam Filed Vitals:   08/15/14 1500  BP: 124/72  Pulse: 79  Height: 5\' 7"  (1.702 m)  Weight: 144 lb (65.318 kg)  SpO2: 98%   Gen: Pleasant, well-nourished, in no distress,  normal affect  ENT: No lesions,  mouth clear,  oropharynx clear, no postnasal drip  Neck: No JVD, no TMG, no carotid bruits  Lungs: No use of accessory muscles, clear without rales or rhonchi, no wheezing  Cardiovascular: RRR, heart sounds normal, no murmur or gallops, no peripheral edema  Musculoskeletal: No deformities, no cyanosis or clubbing  Neuro: alert, non focal  Skin: Warm, no lesions or rashes      Assessment & Plan:  Asthma, intrinsic She has mild intermittent asthma that seems to have been responsive to her short acting beta agonist. At this time she seems to have adequate control using albuterol when necessary. I will defer starting a long-acting beta agonist at this time. I would like to try and control her allergy symptoms better during the spring time as this will impact her asthma   Allergic rhinitis Recommended that she restart her Zyrtec is unknown nasal spray, 2 sprays each nostril daily during the spring time. We will see each other before next spring in order to plan therapy before she has a significant exposure.

## 2014-08-15 NOTE — Assessment & Plan Note (Signed)
She has mild intermittent asthma that seems to have been responsive to her short acting beta agonist. At this time she seems to have adequate control using albuterol when necessary. I will defer starting a long-acting beta agonist at this time. I would like to try and control her allergy symptoms better during the spring time as this will impact her asthma

## 2014-08-23 ENCOUNTER — Other Ambulatory Visit: Payer: Self-pay

## 2014-08-23 MED ORDER — CLOPIDOGREL BISULFATE 75 MG PO TABS: 75.0000 mg | ORAL_TABLET | Freq: Every day | ORAL | Status: DC

## 2014-09-03 ENCOUNTER — Other Ambulatory Visit: Payer: Self-pay | Admitting: Internal Medicine

## 2014-09-04 ENCOUNTER — Other Ambulatory Visit: Payer: Self-pay | Admitting: Internal Medicine

## 2014-09-05 NOTE — Telephone Encounter (Signed)
Per note 10.29.15

## 2014-09-16 ENCOUNTER — Other Ambulatory Visit: Payer: Self-pay | Admitting: Internal Medicine

## 2014-10-08 ENCOUNTER — Encounter: Payer: Self-pay | Admitting: Gastroenterology

## 2014-10-11 ENCOUNTER — Ambulatory Visit (INDEPENDENT_AMBULATORY_CARE_PROVIDER_SITE_OTHER): Payer: Medicare Other | Admitting: Internal Medicine

## 2014-10-11 ENCOUNTER — Encounter: Payer: Self-pay | Admitting: Internal Medicine

## 2014-10-11 VITALS — BP 132/52 | HR 67 | Ht 66.0 in | Wt 145.6 lb

## 2014-10-11 DIAGNOSIS — I447 Left bundle-branch block, unspecified: Secondary | ICD-10-CM | POA: Diagnosis not present

## 2014-10-11 DIAGNOSIS — I255 Ischemic cardiomyopathy: Secondary | ICD-10-CM

## 2014-10-11 DIAGNOSIS — I5022 Chronic systolic (congestive) heart failure: Secondary | ICD-10-CM

## 2014-10-11 DIAGNOSIS — Z4502 Encounter for adjustment and management of automatic implantable cardiac defibrillator: Secondary | ICD-10-CM

## 2014-10-11 DIAGNOSIS — I442 Atrioventricular block, complete: Secondary | ICD-10-CM | POA: Diagnosis not present

## 2014-10-11 DIAGNOSIS — I480 Paroxysmal atrial fibrillation: Secondary | ICD-10-CM

## 2014-10-11 LAB — CUP PACEART INCLINIC DEVICE CHECK
Battery Voltage: 2.99 V
Brady Statistic RA Percent Paced: 19 %
Brady Statistic RV Percent Paced: 99.1 %
Lead Channel Impedance Value: 462.5 Ohm
Lead Channel Impedance Value: 987.5 Ohm
Lead Channel Pacing Threshold Amplitude: 0.75 V
Lead Channel Pacing Threshold Amplitude: 0.75 V
Lead Channel Pacing Threshold Pulse Width: 0.4 ms
Lead Channel Pacing Threshold Pulse Width: 0.4 ms
Lead Channel Pacing Threshold Pulse Width: 0.5 ms
Lead Channel Sensing Intrinsic Amplitude: 11.7 mV
Lead Channel Sensing Intrinsic Amplitude: 2.1 mV
Lead Channel Setting Pacing Amplitude: 2 V
Lead Channel Setting Pacing Amplitude: 2.5 V
Lead Channel Setting Sensing Sensitivity: 5 mV
MDC IDC MSMT BATTERY REMAINING LONGEVITY: 84 mo
MDC IDC MSMT LEADCHNL LV PACING THRESHOLD AMPLITUDE: 1.5 V
MDC IDC MSMT LEADCHNL RA IMPEDANCE VALUE: 412.5 Ohm
MDC IDC MSMT LEADCHNL RA PACING THRESHOLD AMPLITUDE: 0.75 V
MDC IDC MSMT LEADCHNL RA PACING THRESHOLD AMPLITUDE: 0.75 V
MDC IDC MSMT LEADCHNL RA PACING THRESHOLD PULSEWIDTH: 0.4 ms
MDC IDC MSMT LEADCHNL RA PACING THRESHOLD PULSEWIDTH: 0.4 ms
MDC IDC PG MODEL: 3242
MDC IDC PG SERIAL: 7516165
MDC IDC SESS DTM: 20160701171849
MDC IDC SET LEADCHNL LV PACING AMPLITUDE: 2.5 V
MDC IDC SET LEADCHNL LV PACING PULSEWIDTH: 0.5 ms
MDC IDC SET LEADCHNL RV PACING PULSEWIDTH: 0.4 ms

## 2014-10-11 NOTE — Patient Instructions (Addendum)
Medication Instructions:  Your physician recommends that you continue on your current medications as directed. Please refer to the Current Medication list given to you today.  Labwork: None ordered  Testing/Procedures: None ordered  Follow-Up: Remote monitoring is used to monitor your pacemaker  from home. This monitoring reduces the number of office visits required to check your device to one time per year. It allows [place] to keep an eye on the functioning of your device to ensure it is working properly. You are scheduled for a device check from home on 01/13/2015. You may send your transmission at any time that day. If you have a wireless device, the transmission will be sent automatically. After your physician reviews your transmission, you will receive a postcard with your next transmission date.  Your physician recommends that you schedule a follow-up appointment in: 12 months with Dr.Klein  Any Other Special Instructions Will Be Listed Below (If Applicable). Thank you for choosing [facility]!!       ]

## 2014-10-11 NOTE — Progress Notes (Signed)
Patient Care Team: Marin Olp, MD as PCP - General (Family Medicine)   HPI  [name] [name] is a 81 y.o. female Seen in followup for syncope which was in part orthostatic and probably related to orthostatic high-grade heart block. She underwent CRT P.. She has evolved device dependent.  She does however have a history of ischemic heart disease with prior MI and bypass surgery prior PCI to the LAD and previously normal left ventricular function. However, on evaluation 4/14 echocardiogram demonstrated EF 25-30%. LHC 4/14 Proximal LAD stent patent, LIMA-LAD atretic, D1 occluded, proximal circumflex 30%, mid RCA occluded, SVG-D1 normal, SVG-OM2 normal, SVG-distal RCA normal, EF 40% with diffuse HK   Echocardiogram 2/15 demonstrated ejection fraction of 30-35%  She continues to do better with improved exercise; she is in fact doing water aerobics.  She has had less dizziness.   There is no edema.  There is no orthopnea or nocturnal dyspnea.  She has a history of atrial fibrillation that persisted for some years after her bypass surgery   It has been asymptomatic since then and her Coumadin was discontinued . Per chart as TIA listed in the problem list; according to her daughter this was a presumptive diagnosis and was never confirmed. MRI was negative           Past Medical History  Diagnosis Date  . MELANOMA 09/06/2008  . HYPERLIPIDEMIA 10/06/2007  . HYPERTENSION 10/06/2007  . MYOCARDIAL INFARCTION, HX OF 10/06/2007  . CORONARY ARTERY DISEASE 10/06/2007    a. s/p PCI to LAD; b. s/p CABG; c. LHC 07/13/12: Proximal LAD stent patent, LIMA-LAD atretic, D1 occluded, proximal circumflex 30%, mid RCA occluded, SVG-D1 normal, SVG-OM2 normal, SVG-distal RCA normal, EF 40% with diffuse HK  . LBBB 09/06/2008  . Atrial fibrillation 10/06/2007    post op  . VARICOSE VEIN 09/29/2009  . UTERINE CANCER, HX OF 09/06/2008  . PERSONAL HX COLONIC POLYPS 10/30/2009  . Syncope   . Dizziness   .  NICM (nonischemic cardiomyopathy)     Echocardiogram 07/12/12: EF 25-30%, diffuse HK, mild AI, mild MR, mild LAE  . COPD (chronic obstructive pulmonary disease)     Emphysema on 02/03/13 CXR  . History of colonic polyps 10/30/2009    No polyps in 2011. No repeat.      Past Surgical History  Procedure Laterality Date  . Oophorectomy    . Right distal pretibeal      melanoma  . Coronary angioplasty with stent placement  2009  . Coronary artery bypass graft  2004  . Abdominal hysterectomy  1988  . Cardiac catheterization  08/30/2008  . Bi-ventricular pacemaker insertion (crt-p)  02/02/2013    [employer] Jude, serial no. #[id number]   . Left heart catheterization with coronary angiogram Bilateral 07/13/2012    Procedure: LEFT HEART CATHETERIZATION WITH CORONARY ANGIOGRAM;  Surgeon: Peter M Martinique, MD;  Location: [facility] CATH LAB;  Service: Cardiovascular;  Laterality: Bilateral;  . Permanent pacemaker insertion N/A 02/02/2013    Procedure: PERMANENT PACEMAKER INSERTION;  Surgeon: Evans Lance, MD;  Location: [facility] CATH LAB;  Service: Cardiovascular;  Laterality: N/A;    Current Outpatient Prescriptions  Medication Sig Dispense Refill  . albuterol (VENTOLIN HFA) 108 (90 BASE) MCG/ACT inhaler Inhale 2 puffs into the lungs every 4 (four) hours as needed. 1 Inhaler 6  . atorvastatin (LIPITOR) 20 MG tablet TAKE 1 TABLET DAILY 90 tablet 1  . carvedilol (COREG) 3.125 MG tablet TAKE 1 TABLET BY MOUTH TWICE  DAILY WITH A MEAL 180 tablet 0  . clopidogrel (PLAVIX) 75 MG tablet Take 1 tablet (75 mg total) by mouth daily. 90 tablet 1  . escitalopram (LEXAPRO) 10 MG tablet Take 1 tablet (10 mg total) by mouth daily. 30 tablet 3  . lisinopril (PRINIVIL,ZESTRIL) 2.5 MG tablet TAKE 1 TABLET BY MOUTH EVERY DAY 30 tablet 0  . pyridostigmine (MESTINON) 60 MG tablet Take 30 mg by mouth 3 (three) times daily.  5   No current facility-administered medications for this visit.    Allergies  Allergen Reactions  . Nitroglycerin  Other (See Comments)    sensitive to tabs which causes rapid drop in blood pressure. Is ok to use oral spray form    Review of Systems negative except from HPI and PMH  Physical Exam BP 132/52 mmHg  Pulse 67  Ht 5\' 6"  (1.676 m)  Wt 145 lb 9.6 oz (66.044 kg)  BMI 23.51 kg/m2 Well developed and nourished in no acute distress HENT normal Neck supple with JVP-flat Clear Device pocket well healed; without hematoma or erythema.  There is no tethering  Regular rate and rhythm, no murmurs or gallops Abd-soft with active BS No Clubbing cyanosis edema Skin-warm and dry A & Oriented  Grossly normal sensory and motor function  ECG demonstrates P. synchronous pacing with frequent PVCs with a right bundle branch indeterminate axis morphology Rhythm strip of 40 seconds demonstrated isolated PVC  Assessment and  Plan  Hypertension  Complete heart block  Orthostatic intolerance  CHF chronic systolic  PVCs-right bundle branch block indeterminate axis  Pacemaker-St. Jude-CRT The patient's device was interrogated.  The information was reviewed. No changes were made in the programming.     Ischemic heart myopathy with prior bypass  Without symptoms of ischemia  She is doing quite well   Euvolemic continue current meds  PVC frequency as estimated by ECG is consistent with that as estimated by the device, albeit with his tendency to underestimate. This was about 1-2% at this frequency, no specific therapy is indicated Encouraged to continue exercise  Orthostatic intolerance is relatively well compensated. We will continue her on Mestinon

## 2014-10-12 ENCOUNTER — Other Ambulatory Visit: Payer: Self-pay | Admitting: Internal Medicine

## 2014-10-17 ENCOUNTER — Other Ambulatory Visit: Payer: Self-pay | Admitting: Family Medicine

## 2014-10-18 ENCOUNTER — Ambulatory Visit (INDEPENDENT_AMBULATORY_CARE_PROVIDER_SITE_OTHER): Payer: Medicare Other | Admitting: Family Medicine

## 2014-10-18 ENCOUNTER — Encounter: Payer: Self-pay | Admitting: Family Medicine

## 2014-10-18 VITALS — BP 128/64 | HR 73 | Temp 98.5°F | Wt 147.0 lb

## 2014-10-18 DIAGNOSIS — I10 Essential (primary) hypertension: Secondary | ICD-10-CM | POA: Diagnosis not present

## 2014-10-18 DIAGNOSIS — G3184 Mild cognitive impairment, so stated: Secondary | ICD-10-CM | POA: Diagnosis not present

## 2014-10-18 NOTE — Progress Notes (Addendum)
Garret Reddish, MD  Subjective:  [name] [name] is a 81 y.o. year old very pleasant female patient who presents with:  Memory loss/mild cognitive impairment- stable -Patient states has had perhaps some mild worsening of memory issues. Tends to forget names slghtly more. Still reading, staying active around others- not exercising ROS- no headache, blurry vision  Hypertension-controlled  BP Readings from Last 3 Encounters:  10/18/14 128/64  10/11/14 132/52  08/15/14 124/72   Home BP monitoring-no Compliant with medications-yes without side effects except mild orthostasis on mestinon ROS-Denies any CP, HA, SOB, blurry vision, LE edema  Past Medical History- complete heartblock with pacemaker, a fib, CAD, systolic and diastolic CHF due to ischemic cardiomyopathy, asthma, depression, HTN, HLD  Medications- reviewed and updated Current Outpatient Prescriptions  Medication Sig Dispense Refill  . atorvastatin (LIPITOR) 20 MG tablet TAKE 1 TABLET DAILY 90 tablet 1  . carvedilol (COREG) 3.125 MG tablet TAKE 1 TABLET BY MOUTH TWICE DAILY WITH A MEAL 180 tablet 0  . clopidogrel (PLAVIX) 75 MG tablet Take 1 tablet (75 mg total) by mouth daily. 90 tablet 1  . escitalopram (LEXAPRO) 10 MG tablet TAKE 1 TABLET BY MOUTH DAILY 30 tablet 5  . lisinopril (PRINIVIL,ZESTRIL) 2.5 MG tablet TAKE 1 TABLET BY MOUTH EVERY DAY 30 tablet 0  . pyridostigmine (MESTINON) 60 MG tablet Take 30 mg by mouth 3 (three) times daily.  5  . albuterol (VENTOLIN HFA) 108 (90 BASE) MCG/ACT inhaler Inhale 2 puffs into the lungs every 4 (four) hours as needed. (Patient not taking: Reported on 10/18/2014) 1 Inhaler 6   Objective: BP 128/64 mmHg  Pulse 73  Temp(Src) 98.5 F (36.9 C)  Wt 147 lb (66.679 kg) Gen: NAD, resting comfortably CV: RRR no murmurs rubs or gallops Lungs: CTAB no crackles, wheeze, rhonchi Abdomen: soft/nontender/nondistended/normal bowel sounds. No rebound or guarding.  Ext: no edema Skin: warm, dry, no  rash Neuro: grossly normal, moves all extremities   Assessment/Plan:  Mild cognitive impairment Mild cognitive impairment. MMSE 04/2014 of 25; 27 today. Continue to trend every 6 months. If progressive worsening consider Aricept (appears stable today)  Essential hypertension Needs ace and beta blocker so remains on mestinon to allow her to take these. BP controlled, still with some orthostasis at times but no falls.    6 months.  Lost point for date and 2 points on 3 word recall. Redirected herself on world backwards and was correct

## 2014-10-18 NOTE — Assessment & Plan Note (Signed)
Mild cognitive impairment. MMSE 04/2014 of 25; 27 today. Continue to trend every 6 months. If progressive worsening consider Aricept (appears stable today)

## 2014-10-18 NOTE — Assessment & Plan Note (Signed)
Needs ace and beta blocker so remains on mestinon to allow her to take these. BP controlled, still with some orthostasis at times but no falls.

## 2014-10-18 NOTE — Patient Instructions (Addendum)
Memory test slightly better but will essentially cll this stable- repeat again in 6 months  No changes to other medications

## 2014-10-24 ENCOUNTER — Ambulatory Visit: Payer: Medicare Other | Admitting: Family Medicine

## 2014-12-15 ENCOUNTER — Other Ambulatory Visit: Payer: Self-pay | Admitting: Internal Medicine

## 2015-01-13 ENCOUNTER — Encounter: Payer: Self-pay | Admitting: Internal Medicine

## 2015-01-13 ENCOUNTER — Ambulatory Visit (INDEPENDENT_AMBULATORY_CARE_PROVIDER_SITE_OTHER): Payer: Medicare Other | Admitting: *Deleted

## 2015-01-13 DIAGNOSIS — I442 Atrioventricular block, complete: Secondary | ICD-10-CM

## 2015-01-13 DIAGNOSIS — I5022 Chronic systolic (congestive) heart failure: Secondary | ICD-10-CM | POA: Diagnosis not present

## 2015-01-13 NOTE — Progress Notes (Signed)
Remote pacemaker transmission.   

## 2015-01-14 LAB — CUP PACEART REMOTE DEVICE CHECK
Battery Remaining Percentage: 95.5 %
Battery Voltage: 2.99 V
Brady Statistic AP VS Percent: 1 %
Brady Statistic AS VP Percent: 72 %
Brady Statistic AS VS Percent: 1 %
Brady Statistic RA Percent Paced: 26 %
Date Time Interrogation Session: 20161003102244
Lead Channel Impedance Value: 400 Ohm
Lead Channel Impedance Value: 460 Ohm
Lead Channel Impedance Value: 990 Ohm
Lead Channel Pacing Threshold Amplitude: 0.75 V
Lead Channel Pacing Threshold Amplitude: 1.625 V
Lead Channel Pacing Threshold Pulse Width: 0.4 ms
Lead Channel Sensing Intrinsic Amplitude: 11.3 mV
Lead Channel Setting Pacing Amplitude: 2 V
Lead Channel Setting Pacing Amplitude: 2.5 V
Lead Channel Setting Pacing Pulse Width: 0.4 ms
Lead Channel Setting Pacing Pulse Width: 0.5 ms
MDC IDC MSMT BATTERY REMAINING LONGEVITY: 86 mo
MDC IDC MSMT LEADCHNL LV PACING THRESHOLD PULSEWIDTH: 0.5 ms
MDC IDC MSMT LEADCHNL RA PACING THRESHOLD AMPLITUDE: 0.75 V
MDC IDC MSMT LEADCHNL RA SENSING INTR AMPL: 1.9 mV
MDC IDC MSMT LEADCHNL RV PACING THRESHOLD PULSEWIDTH: 0.4 ms
MDC IDC PG SERIAL: 7516165
MDC IDC SET LEADCHNL LV PACING AMPLITUDE: 2.625
MDC IDC SET LEADCHNL RV SENSING SENSITIVITY: 5 mV
MDC IDC STAT BRADY AP VP PERCENT: 27 %
Pulse Gen Model: 3242

## 2015-01-16 ENCOUNTER — Ambulatory Visit (INDEPENDENT_AMBULATORY_CARE_PROVIDER_SITE_OTHER): Payer: Medicare Other | Admitting: Family Medicine

## 2015-01-16 ENCOUNTER — Encounter: Payer: Self-pay | Admitting: Family Medicine

## 2015-01-16 VITALS — BP 132/70 | HR 75 | Temp 98.4°F | Ht 66.0 in | Wt 147.6 lb

## 2015-01-16 DIAGNOSIS — W5501XA Bitten by cat, initial encounter: Secondary | ICD-10-CM

## 2015-01-16 DIAGNOSIS — S8011XA Contusion of right lower leg, initial encounter: Secondary | ICD-10-CM | POA: Diagnosis not present

## 2015-01-16 MED ORDER — AMOXICILLIN-POT CLAVULANATE 875-125 MG PO TABS: 1.0000 | ORAL_TABLET | Freq: Two times a day (BID) | ORAL | Status: DC

## 2015-01-16 NOTE — Progress Notes (Signed)
Pre visit review using our clinic review tool, if applicable. No additional management support is needed unless otherwise documented below in the visit note. 

## 2015-01-16 NOTE — Progress Notes (Signed)
HPI:  Acute visit for:  Cat Bite: -occurred late last night -indoor, domestic cat - up to date on all shots per pt, bit her leg when she mistakenly stepped on his tail -she washed the wound well with soap and water and it bled significantly then stopped  -denies: pain today, swelling, erythema around wound, fevers, malaise -UTD on tetanus booster -no abx allergies  ROS: See pertinent positives and negatives per HPI.  Past Medical History  Diagnosis Date  . MELANOMA 09/06/2008  . HYPERLIPIDEMIA 10/06/2007  . HYPERTENSION 10/06/2007  . MYOCARDIAL INFARCTION, HX OF 10/06/2007  . CORONARY ARTERY DISEASE 10/06/2007    a. s/p PCI to LAD; b. s/p CABG; c. LHC 07/13/12: Proximal LAD stent patent, LIMA-LAD atretic, D1 occluded, proximal circumflex 30%, mid RCA occluded, SVG-D1 normal, SVG-OM2 normal, SVG-distal RCA normal, EF 40% with diffuse HK  . LBBB 09/06/2008  . Atrial fibrillation ([place]) 10/06/2007    post op  . VARICOSE VEIN 09/29/2009  . UTERINE CANCER, HX OF 09/06/2008  . PERSONAL HX COLONIC POLYPS 10/30/2009  . Syncope   . Dizziness   . NICM (nonischemic cardiomyopathy) ([place])     Echocardiogram 07/12/12: EF 25-30%, diffuse HK, mild AI, mild MR, mild LAE  . COPD (chronic obstructive pulmonary disease) ([place])     Emphysema on 02/03/13 CXR  . History of colonic polyps 10/30/2009    No polyps in 2011. No repeat.      Past Surgical History  Procedure Laterality Date  . Oophorectomy    . Right distal pretibeal      melanoma  . Coronary angioplasty with stent placement  2009  . Coronary artery bypass graft  2004  . Abdominal hysterectomy  1988  . Cardiac catheterization  08/30/2008  . Bi-ventricular pacemaker insertion (crt-p)  02/02/2013    St. Jude, serial no. #[id number]   . Left heart catheterization with coronary angiogram Bilateral 07/13/2012    Procedure: LEFT HEART CATHETERIZATION WITH CORONARY ANGIOGRAM;  Surgeon: Peter M Martinique, MD;  Location: [facility] CATH LAB;  Service: Cardiovascular;   Laterality: Bilateral;  . Permanent pacemaker insertion N/A 02/02/2013    Procedure: PERMANENT PACEMAKER INSERTION;  Surgeon: Evans Lance, MD;  Location: [facility] CATH LAB;  Service: Cardiovascular;  Laterality: N/A;    Family History  Problem Relation Age of Onset  . Stroke Mother   . Heart attack Father   . Colon cancer Neg Hx     Social History   Social History  . Marital Status: Widowed    Spouse Name: N/A  . Number of Children: N/A  . Years of Education: N/A   Social History Main Topics  . Smoking status: Never Smoker   . Smokeless tobacco: Never Used  . Alcohol Use: Yes     Comment: daily gin or wine  . Drug Use: No  . Sexual Activity: Not Asked   Other Topics Concern  . None   Social History Narrative   Widowed. Lives alone with cat. 3 children. 6 grandkids   Daughter  (NP) and son in Sports coach (pharmacist-teaches at [employer]) that live close and help out.       Drives in the daytime, shops for herself, cleaning-has someone over to clean due to the intermittent lightheadedness, bathing      Retired from physical education.            Current outpatient prescriptions:  .  albuterol (VENTOLIN HFA) 108 (90 BASE) MCG/ACT inhaler, Inhale 2 puffs into the lungs every 4 (four)  hours as needed., Disp: 1 Inhaler, Rfl: 6 .  atorvastatin (LIPITOR) 20 MG tablet, TAKE 1 TABLET DAILY, Disp: 90 tablet, Rfl: 1 .  carvedilol (COREG) 3.125 MG tablet, TAKE 1 TABLET BY MOUTH TWICE DAILY WITH A MEAL., Disp: 180 tablet, Rfl: 1 .  clopidogrel (PLAVIX) 75 MG tablet, Take 1 tablet (75 mg total) by mouth daily., Disp: 90 tablet, Rfl: 1 .  escitalopram (LEXAPRO) 10 MG tablet, TAKE 1 TABLET BY MOUTH DAILY, Disp: 30 tablet, Rfl: 5 .  lisinopril (PRINIVIL,ZESTRIL) 2.5 MG tablet, TAKE 1 TABLET BY MOUTH EVERY DAY, Disp: 30 tablet, Rfl: 0 .  pyridostigmine (MESTINON) 60 MG tablet, Take 30 mg by mouth 3 (three) times daily., Disp: , Rfl: 5 .  amoxicillin-clavulanate (AUGMENTIN) 875-125 MG tablet, Take  1 tablet by mouth 2 (two) times daily., Disp: 10 tablet, Rfl: 0  EXAM:  Filed Vitals:   01/16/15 1140  BP: 132/70  Pulse: 75  Temp: 98.4 F (36.9 C)    Body mass index is 23.83 kg/(m^2).  GENERAL: vitals reviewed and listed above, alert, oriented, appears well hydrated and in no acute distress  HEENT: atraumatic, conjunttiva clear, no obvious abnormalities on inspection of external nose and ears  NECK: no obvious masses on inspection  SKIN: small skin flap R shin with no surrounding erythema or swelling  MS: moves all extremities without noticeable abnormality  PSYCH: pleasant and cooperative, no obvious depression or anxiety  ASSESSMENT AND PLAN:  Discussed the following assessment and plan:  Cat bite, initial encounter  -flap of dead skin removed, area gently irrigated with saline, no obvious signs of deep puncture wound or infection at this time -wound dressed -advised of high risk nature of cat bite wounds thought this wound appears well at this time, we did opt after discussion risks to also do a short course of Augmentin -Patient advised to return or notify a doctor immediately if symptoms worsen or persist or new concerns arise.  Patient Instructions  Keep wound clean and dry  Take antibiotic as instructed. -As we discussed, we have prescribed a new medication for you at this appointment. We discussed the common and serious potential adverse effects of this medication and you can review these and more with the pharmacist when you pick up your medication.  Please follow the instructions for use carefully and notify us immediately if you have any problems taking this medication.  Follow up/see a doctor immediately if you develop any redness or swelling around the wound, worsening pain, pus or concerns for infection     KIM, HANNAH R.

## 2015-01-16 NOTE — Patient Instructions (Signed)
Keep wound clean and dry  Take antibiotic as instructed. -As we discussed, we have prescribed a new medication for you at this appointment. We discussed the common and serious potential adverse effects of this medication and you can review these and more with the pharmacist when you pick up your medication.  Please follow the instructions for use carefully and notify us immediately if you have any problems taking this medication.  Follow up/see a doctor immediately if you develop any redness or swelling around the wound, worsening pain, pus or concerns for infection

## 2015-01-21 ENCOUNTER — Telehealth: Payer: Self-pay | Admitting: Internal Medicine

## 2015-01-21 DIAGNOSIS — I480 Paroxysmal atrial fibrillation: Secondary | ICD-10-CM

## 2015-01-21 NOTE — Telephone Encounter (Signed)
Dr. Caryl Comes- I don't see where this patient is supposed to start Eliquis. If so, dose? Does she need to stop plavix?

## 2015-01-21 NOTE — Telephone Encounter (Signed)
Follow up      Calling because no one has called her back regarding pt starting eliquis.  Has the eliquis been called in to the pharmacy?  Should pt stop the plavix when she starts the eliquis?

## 2015-01-21 NOTE — Telephone Encounter (Signed)
Reviewed with Dr. Caryl Comes- he states the patient has had a-fib on her device. She needs to start on Eliquis and will stop plavix. Last BMP was in January 2016. She needs to repeat her BMP prior to starting Eliquis. I have spoken with [name] and notified her of this- she will call the patient and ask her when she can come for labs at our office and call me back.

## 2015-01-21 NOTE — Telephone Encounter (Signed)
New message    Daughter calling from [facility].    Pt C/O medication issue:  1. Name of Medication: eliquis   2. How are you currently taking this medication (dosage and times per day)? New medication starting by Dr. Caryl Comes   3. Are you having a reaction (difficulty breathing--STAT)? No   4. What is your medication issue? Plavix will be stop new medication eliquis need to confirm with Dr. Caryl Comes on this.

## 2015-01-22 ENCOUNTER — Other Ambulatory Visit (INDEPENDENT_AMBULATORY_CARE_PROVIDER_SITE_OTHER): Payer: Medicare Other

## 2015-01-22 DIAGNOSIS — I480 Paroxysmal atrial fibrillation: Secondary | ICD-10-CM

## 2015-01-22 LAB — CBC WITH DIFFERENTIAL/PLATELET
BASOS ABS: 0 10*3/uL (ref 0.0–0.1)
BASOS PCT: 0 % (ref 0–1)
EOS ABS: 0.2 10*3/uL (ref 0.0–0.7)
Eosinophils Relative: 3 % (ref 0–5)
HCT: 39.6 % (ref 36.0–46.0)
Hemoglobin: 13.3 g/dL (ref 12.0–15.0)
Lymphocytes Relative: 22 % (ref 12–46)
Lymphs Abs: 1.2 10*3/uL (ref 0.7–4.0)
MCH: 29.4 pg (ref 26.0–34.0)
MCHC: 33.6 g/dL (ref 30.0–36.0)
MCV: 87.6 fL (ref 78.0–100.0)
MPV: 11.4 fL (ref 8.6–12.4)
Monocytes Absolute: 0.4 10*3/uL (ref 0.1–1.0)
Monocytes Relative: 7 % (ref 3–12)
Neutro Abs: 3.8 10*3/uL (ref 1.7–7.7)
Neutrophils Relative %: 68 % (ref 43–77)
PLATELETS: 141 10*3/uL — AB (ref 150–400)
RBC: 4.52 MIL/uL (ref 3.87–5.11)
RDW: 14 % (ref 11.5–15.5)
WBC: 5.6 10*3/uL (ref 4.0–10.5)

## 2015-01-22 LAB — BASIC METABOLIC PANEL
BUN: 10 mg/dL (ref 7–25)
CO2: 29 mmol/L (ref 20–31)
CREATININE: 0.74 mg/dL (ref 0.60–0.88)
Calcium: 8.9 mg/dL (ref 8.6–10.4)
Chloride: 100 mmol/L (ref 98–110)
Glucose, Bld: 84 mg/dL (ref 65–99)
Potassium: 4.3 mmol/L (ref 3.5–5.3)
Sodium: 136 mmol/L (ref 135–146)

## 2015-01-23 NOTE — Telephone Encounter (Signed)
Notes Recorded by Deboraha Sprang, MD on 01/22/2015 at 4:55 PM Please Inform Patient that labs are normal And apixoban dose will be 5 mg bid Thanks  I spoke with the patient's daughter and she is aware of the patient's lab results. Eliquis 5 mg samples pulled for the patient # 4 boxes for new start. Per the patient's daughter, [name] [name], she will come by and pick these up for the patient tomorrow. She will call back for where to send a RX to if patient tolerates samples.

## 2015-01-27 ENCOUNTER — Telehealth: Payer: Self-pay | Admitting: Internal Medicine

## 2015-01-27 NOTE — Telephone Encounter (Signed)
New Message   Pt Daughter is a PA and she is calling feel free to use call number  BP 120/70  Pt c/o Syncope: STAT if syncope occurred within 30 minutes and pt complains of lightheadedness High Priority if episode of passing out, completely, today or in last 24 hours   1. Did you pass out today?  no  2. When is the last time you passed out? She has in the past not reciently  3. Has this occurred multiple times?  She is very light headed  4. Did you have any symptoms prior to passing out? lightheadedness

## 2015-01-27 NOTE — Telephone Encounter (Signed)
Daughter [name] [name]) calling stating her Mother has been very lightheaded recently.  BP was 120/ last week.  States she hasn't passed out but is very light headed. States her Mom tolerates a higher BP reading.  Is on low dose of Lisinopril and Carvedilol.  Daughter is a NP and is concerned that her medications need to be adjusted.  Advised for her to take BP twice a day and to bring readings with her.  Gave her an appointment on 10/26 w/Dr. Caryl Comes at 2:00. Pt recently was started on Eliquis and she thinks her Mom is relating being on the medication to being lightheaded. She verbalizes understanding regarding BP readings and appointment.

## 2015-02-05 ENCOUNTER — Encounter: Payer: Self-pay | Admitting: Internal Medicine

## 2015-02-05 ENCOUNTER — Ambulatory Visit (INDEPENDENT_AMBULATORY_CARE_PROVIDER_SITE_OTHER): Payer: Medicare Other | Admitting: Internal Medicine

## 2015-02-05 VITALS — BP 120/66 | HR 70 | Ht 67.0 in | Wt 146.8 lb

## 2015-02-05 DIAGNOSIS — I442 Atrioventricular block, complete: Secondary | ICD-10-CM | POA: Diagnosis not present

## 2015-02-05 DIAGNOSIS — I480 Paroxysmal atrial fibrillation: Secondary | ICD-10-CM | POA: Diagnosis not present

## 2015-02-05 DIAGNOSIS — I447 Left bundle-branch block, unspecified: Secondary | ICD-10-CM | POA: Diagnosis not present

## 2015-02-05 DIAGNOSIS — I255 Ischemic cardiomyopathy: Secondary | ICD-10-CM

## 2015-02-05 DIAGNOSIS — I5042 Chronic combined systolic (congestive) and diastolic (congestive) heart failure: Secondary | ICD-10-CM

## 2015-02-05 LAB — CUP PACEART INCLINIC DEVICE CHECK
Battery Remaining Longevity: 81.6
Battery Voltage: 2.99 V
Brady Statistic RA Percent Paced: 25 %
Brady Statistic RV Percent Paced: 99.11 %
Date Time Interrogation Session: 20161026151010
Implantable Lead Implant Date: 20141024
Implantable Lead Implant Date: 20141024
Implantable Lead Implant Date: 20141024
Implantable Lead Location: 753858
Implantable Lead Location: 753859
Implantable Lead Location: 753860
Lead Channel Impedance Value: 387.5 Ohm
Lead Channel Impedance Value: 425 Ohm
Lead Channel Impedance Value: 962.5 Ohm
Lead Channel Pacing Threshold Amplitude: 0.75 V
Lead Channel Pacing Threshold Amplitude: 0.75 V
Lead Channel Pacing Threshold Amplitude: 0.75 V
Lead Channel Pacing Threshold Amplitude: 0.75 V
Lead Channel Pacing Threshold Amplitude: 1.75 V
Lead Channel Pacing Threshold Pulse Width: 0.4 ms
Lead Channel Pacing Threshold Pulse Width: 0.4 ms
Lead Channel Pacing Threshold Pulse Width: 0.4 ms
Lead Channel Pacing Threshold Pulse Width: 0.4 ms
Lead Channel Pacing Threshold Pulse Width: 0.5 ms
Lead Channel Sensing Intrinsic Amplitude: 12 mV
Lead Channel Sensing Intrinsic Amplitude: 2.2 mV
Lead Channel Setting Pacing Amplitude: 2 V
Lead Channel Setting Pacing Amplitude: 2.5 V
Lead Channel Setting Pacing Amplitude: 2.75 V
Lead Channel Setting Pacing Pulse Width: 0.4 ms
Lead Channel Setting Pacing Pulse Width: 0.5 ms
Lead Channel Setting Sensing Sensitivity: 5 mV
Pulse Gen Model: 3242
Pulse Gen Serial Number: 7516165

## 2015-02-05 NOTE — Patient Instructions (Signed)
Medication Instructions: - no changes  Labwork: - none  Procedures/Testing: - none  Follow-Up: - Your physician recommends that you schedule a follow-up appointment in: 3 months with Dr. Klein.  Any Additional Special Instructions Will Be Listed Below (If Applicable).   

## 2015-02-05 NOTE — Progress Notes (Signed)
Patient Care Team: Marin Olp, MD as PCP - General (Family Medicine)   HPI  [name] [name] is a 81 y.o. female Seen in followup for syncope which was in part orthostatic and probably related to orthostatic high-grade heart block. She underwent CRT P.. She has evolved device dependent.  She does however have a history of ischemic heart disease with prior MI and bypass surgery prior PCI to the LAD and previously normal left ventricular function. However, on evaluation 4/14 echocardiogram demonstrated EF 25-30%. LHC 4/14 Proximal LAD stent patent, LIMA-LAD atretic, D1 occluded, proximal circumflex 30%, mid RCA occluded, SVG-D1 normal, SVG-OM2 normal, SVG-distal RCA normal, EF 40% with diffuse HK   Echocardiogram 2/15 demonstrated ejection fraction of 30-35%  She continues to do better with improved exercise; she is in fact doing water aerobics.   There is no edema.  There is no orthopnea or nocturnal dyspnea.  She has a history of atrial fibrillation that persisted for some years after her bypass surgery recurrent atrial fibrillation was detected. Apixaban has been started.  There has been an issue of a stroke in the past although never confirmed. In this regard notable that the MRI was negative She's had problems again with orthostatic intolerance. This is been particularly notable over the last week or 2. Review of telephone calls makes mention of the daughter's concern that her blood pressure 120 has been too low for her mother in the past.          Past Medical History  Diagnosis Date  . MELANOMA 09/06/2008  . HYPERLIPIDEMIA 10/06/2007  . HYPERTENSION 10/06/2007  . MYOCARDIAL INFARCTION, HX OF 10/06/2007  . CORONARY ARTERY DISEASE 10/06/2007    a. s/p PCI to LAD; b. s/p CABG; c. LHC 07/13/12: Proximal LAD stent patent, LIMA-LAD atretic, D1 occluded, proximal circumflex 30%, mid RCA occluded, SVG-D1 normal, SVG-OM2 normal, SVG-distal RCA normal, EF 40% with diffuse HK  .  LBBB 09/06/2008  . Atrial fibrillation ([place]) 10/06/2007    post op  . VARICOSE VEIN 09/29/2009  . UTERINE CANCER, HX OF 09/06/2008  . PERSONAL HX COLONIC POLYPS 10/30/2009  . Syncope   . Dizziness   . NICM (nonischemic cardiomyopathy) ([place])     Echocardiogram 07/12/12: EF 25-30%, diffuse HK, mild AI, mild MR, mild LAE  . COPD (chronic obstructive pulmonary disease) ([place])     Emphysema on 02/03/13 CXR  . History of colonic polyps 10/30/2009    No polyps in 2011. No repeat.      Past Surgical History  Procedure Laterality Date  . Oophorectomy    . Right distal pretibeal      melanoma  . Coronary angioplasty with stent placement  2009  . Coronary artery bypass graft  2004  . Abdominal hysterectomy  1988  . Cardiac catheterization  08/30/2008  . Bi-ventricular pacemaker insertion (crt-p)  02/02/2013    St. Jude, serial no. #[id number]   . Left heart catheterization with coronary angiogram Bilateral 07/13/2012    Procedure: LEFT HEART CATHETERIZATION WITH CORONARY ANGIOGRAM;  Surgeon: Peter M Martinique, MD;  Location[facility] C[facility] LAB;  Service: Cardiovascular;  Laterality: Bilateral;  . Permanent pacemaker insertion N/A 02/02/2013    Procedure: PERMANENT PACEMAKER INSERTION;  Surgeon: Evans Lance, MD;  Location: [facility] CATH LAB;  Service: Cardiovascular;  Laterality: N/A;    Current Outpatient Prescriptions  Medication Sig Dispense Refill  . albuterol (VENTOLIN HFA) 108 (90 BASE) MCG/ACT inhaler Inhale 2 puffs into the lungs every 4 (four)  hours as needed. 1 Inhaler 6  . apixaban (ELIQUIS) 5 MG TABS tablet Take 1 tablet (5 mg total) by mouth 2 (two) times daily.    Marland Kitchen atorvastatin (LIPITOR) 20 MG tablet TAKE 1 TABLET DAILY 90 tablet 1  . carvedilol (COREG) 3.125 MG tablet TAKE 1 TABLET BY MOUTH TWICE DAILY WITH A MEAL. 180 tablet 1  . escitalopram (LEXAPRO) 10 MG tablet TAKE 1 TABLET BY MOUTH DAILY 30 tablet 5  . lisinopril (PRINIVIL,ZESTRIL) 2.5 MG tablet TAKE 1 TABLET BY MOUTH EVERY DAY 30 tablet 0    . pyridostigmine (MESTINON) 60 MG tablet Take 30 mg by mouth 3 (three) times daily.  5   No current facility-administered medications for this visit.    Allergies  Allergen Reactions  . Nitroglycerin Other (See Comments)    sensitive to tabs which causes rapid drop in blood pressure. Is ok to use oral spray form    Review of Systems negative except from HPI and PMH  Physical Exam BP 120/66 mmHg  Pulse 70  Ht 5\' 7"  (1.702 m)  Wt 146 lb 12.8 oz (66.588 kg)  BMI 22.99 kg/m2 Well developed and nourished in no acute distress HENT normal Neck supple with JVP-flat Clear Device pocket well healed; without hematoma or erythema.  There is no tethering  Regular rate and rhythm, no murmurs or gallops Abd-soft with active BS No Clubbing cyanosis edema Skin-warm and dry A & Oriented  Grossly normal sensory and motor function  ECG demonstrates P. synchronous pacing with frequent PVCs with a right bundle branch indeterminate axis morphology Rhythm strip of 40 seconds demonstrated isolated PVC  Assessment and  Plan  Hypertension  Complete heart block  Orthostatic intolerance  CHF chronic systolic  Atrial fibrillation  PVCs-right bundle branch block indeterminate axis  Pacemaker-St. Jude-CRT The patient's device was interrogated.  The information was reviewed. No changes were made in the programming.     Ischemic heart myopathy with prior bypass  Without symptoms of ischemia   She's been started on apixaban for her atrial fibrillation. There been any discussions about risk and benefits. He orthostatic intolerance has been a recurring issue of late. We will resume the use of an abdominal binder prior to the decreasing of her afterload with the increasing of her Mestinon.

## 2015-02-19 ENCOUNTER — Encounter: Payer: Self-pay | Admitting: Cardiology

## 2015-02-21 ENCOUNTER — Other Ambulatory Visit: Payer: Self-pay | Admitting: Pharmacist

## 2015-02-21 MED ORDER — APIXABAN 5 MG PO TABS: 5.0000 mg | ORAL_TABLET | Freq: Two times a day (BID) | ORAL | Status: DC

## 2015-03-27 ENCOUNTER — Ambulatory Visit (INDEPENDENT_AMBULATORY_CARE_PROVIDER_SITE_OTHER): Payer: Medicare Other | Admitting: Family Medicine

## 2015-03-27 ENCOUNTER — Encounter: Payer: Self-pay | Admitting: Family Medicine

## 2015-03-27 VITALS — BP 130/62 | HR 67 | Temp 98.2°F | Wt 148.0 lb

## 2015-03-27 DIAGNOSIS — B351 Tinea unguium: Secondary | ICD-10-CM

## 2015-03-27 NOTE — Patient Instructions (Signed)
We are going to see if we can get your appointment bumped up before you leave.   I would keep toenail polish off toe and try to wear loose fitting shoe. I do not see any skin infection near the nail at present.   You will likely need toenail removed.

## 2015-03-27 NOTE — Progress Notes (Signed)
Garret Reddish, MD  Subjective:  [name] [name] is a 81 y.o. year old very pleasant female patient who presents for/with See problem oriented charting ROS- no fever chills nausea vomiting. Does admit to toe pain.   Past Medical History-  Patient Active Problem List   Diagnosis Date Noted  . CAD (coronary artery disease) 04/17/2014    Priority: High  . Mild cognitive impairment 04/17/2014    Priority: High  . Pacemaker -CRT-St. Jude 05/21/2013    Priority: High  . Atrioventricular block, complete ([place]) 03/08/2013    Priority: High  . Paroxysmal atrial fibrillation ([facility]) 02/03/2013    Priority: High  . Chronic combined systolic and diastolic congestive heart failure ([place]) 10/10/2012    Priority: High  . Ischemic cardiomyopathy  EF 30% cath 4/14 with stent 10/06/2007    Priority: High  . Asthma, intrinsic 02/03/2013    Priority: Medium  . Depression 09/19/2012    Priority: Medium  . Melanoma of skin ([place]) 09/06/2008    Priority: Medium  . Uterine cancer ([place]) 09/06/2008    Priority: Medium  . Hyperlipidemia 10/06/2007    Priority: Medium  . Essential hypertension 10/06/2007    Priority: Medium  . Allergic rhinitis 04/17/2014    Priority: Low  . Hearing loss 04/17/2014    Priority: Low  . Varicose veins 09/29/2009    Priority: Low  . LBBB (left bundle branch block) 09/06/2008    Priority: Low    Medications- reviewed and updated Current Outpatient Prescriptions  Medication Sig Dispense Refill  . albuterol (VENTOLIN HFA) 108 (90 BASE) MCG/ACT inhaler Inhale 2 puffs into the lungs every 4 (four) hours as needed. 1 Inhaler 6  . apixaban (ELIQUIS) 5 MG TABS tablet Take 1 tablet (5 mg total) by mouth 2 (two) times daily. 60 tablet 6  . atorvastatin (LIPITOR) 20 MG tablet TAKE 1 TABLET DAILY 90 tablet 1  . carvedilol (COREG) 3.125 MG tablet TAKE 1 TABLET BY MOUTH TWICE DAILY WITH A MEAL. 180 tablet 1  . escitalopram (LEXAPRO) 10 MG tablet TAKE 1 TABLET BY MOUTH DAILY 30  tablet 5  . lisinopril (PRINIVIL,ZESTRIL) 2.5 MG tablet TAKE 1 TABLET BY MOUTH EVERY DAY 30 tablet 0  . pyridostigmine (MESTINON) 60 MG tablet Take 30 mg by mouth 3 (three) times daily.  5   Objective: BP 130/62 mmHg  Pulse 67  Temp(Src) 98.2 F (36.8 C)  Wt 148 lb (67.132 kg) Gen: NAD, resting comfortably CV: RRR no murmurs rubs or gallops Lungs: CTAB no crackles, wheeze, rhonchi Ext: no edema Left great toenail with thickened yellow appearance. There is a hole in the top of the nail with thick black substance underneath. There is no purulence. No signs of cellulitis or ingrown toenail. Pain with palpation pressing down on nail.  Skin: warm, dry, no rash Neuro: grossly normal, moves all extremities  Assessment/Plan: L great toenail onychomycosis S: Has noted some thickening of left great toenail at least for last 6 months. Continued to simply paint over the thickened nail and never really noticed it was underneath. Month ago noted a hole in the nail and decided to uncover the nail and then she noted a thickened yellow nail as well as a black substance underneath the nail. She is also noted pain as the nail rubs on almost all of her shoes A/P: We will refer to podiatry. Dr. Jerrilyn Cairo was kind enough to move appointment up 4 days to December 26. Patient had previously called and schedule an appointment on  December 30. I do not see any ingrown nail at this time. I do not see an active infection on the skin. I'm concerned about the substance underneath the nail question fungus and await podiatry's expertise  Return precautions advised before podiatry appointment  Orders Placed This Encounter  Procedures  . Ambulatory referral to Podiatry    Referral Priority:  Urgent    Referral Type:  Consultation    Referral Reason:  Specialty Services Required    Referred to Provider:  Wallene Huh, DPM    Requested Specialty:  Podiatry    Number of Visits Requested:  1

## 2015-04-07 ENCOUNTER — Ambulatory Visit: Payer: Self-pay | Admitting: Podiatry

## 2015-04-11 ENCOUNTER — Ambulatory Visit: Payer: Self-pay | Admitting: Podiatry

## 2015-04-17 ENCOUNTER — Ambulatory Visit: Payer: Self-pay | Admitting: Podiatry

## 2015-04-21 ENCOUNTER — Ambulatory Visit: Payer: Medicare Other | Admitting: Family Medicine

## 2015-04-24 ENCOUNTER — Encounter: Payer: Self-pay | Admitting: Podiatry

## 2015-04-24 ENCOUNTER — Ambulatory Visit (INDEPENDENT_AMBULATORY_CARE_PROVIDER_SITE_OTHER): Payer: Medicare Other | Admitting: Podiatry

## 2015-04-24 ENCOUNTER — Ambulatory Visit: Payer: Self-pay | Admitting: Podiatry

## 2015-04-24 ENCOUNTER — Ambulatory Visit: Payer: Medicare Other | Admitting: Family Medicine

## 2015-04-24 VITALS — BP 136/78 | HR 74 | Resp 16

## 2015-04-24 DIAGNOSIS — L600 Ingrowing nail: Secondary | ICD-10-CM

## 2015-04-24 NOTE — Patient Instructions (Signed)

## 2015-04-24 NOTE — Progress Notes (Signed)
   Subjective:    Patient ID: [name] [name], female    DOB: 11/09/1931, 82 y.o.   MRN: [id number]  HPI    Review of Systems  All other systems reviewed and are negative.      Objective:   Physical Exam        Assessment & Plan:

## 2015-04-27 NOTE — Progress Notes (Signed)
Subjective:     Patient ID: [name] [name], female   DOB: 18-Jul-1931, 82 y.o.   MRN: [id number]  HPI patient presents stating I'm a very thickened left big toenail that's painful when pressed and making it hard for me to wear shoe gear and I can certainly not cut it referred by Dr. Yong Channel   Review of Systems  All other systems reviewed and are negative.      Objective:   Physical Exam  Constitutional: She is oriented to person, place, and time.  Cardiovascular: Intact distal pulses.   Musculoskeletal: Normal range of motion.  Neurological: She is oriented to person, place, and time.  Skin: Skin is warm and dry.  Nursing note and vitals reviewed.  neurovascular status intact muscle strength adequate with range of motion found to be moderately reduced subtalar midtarsal joint. Patient's found to have inflammatory changes in the left big toenail with thickness of the bed and pain when pressed with looseness of the underlying nail structure. Good digital perfusion noted and well oriented 3     Assessment:     Damaged left hallux nail with thickness and pain with palpation    Plan:     H&P condition reviewed and I explained to her options. She wants it removed due to the thickness pain in the inability for her to take care of it and at this time I allowed her to understand correction and risk and I infiltrated 60 mg Xylocaine Marcaine mixture. Under sterile conditions the hallux nails removed the matrix is exposed and I applied phenol 5 applications 30 seconds followed by alcohol lavage and sterile dressing. Gave instructions on soaks and reappoint

## 2015-04-29 ENCOUNTER — Ambulatory Visit (INDEPENDENT_AMBULATORY_CARE_PROVIDER_SITE_OTHER): Payer: Medicare Other | Admitting: Emergency Medicine

## 2015-04-29 ENCOUNTER — Encounter: Payer: Self-pay | Admitting: Emergency Medicine

## 2015-04-29 VITALS — BP 128/70 | HR 61 | Ht 67.0 in | Wt 147.0 lb

## 2015-04-29 DIAGNOSIS — J4520 Mild intermittent asthma, uncomplicated: Secondary | ICD-10-CM

## 2015-04-29 NOTE — Assessment & Plan Note (Signed)
Stable at this time without any evidence of recent exacerbation or regular daytime symptoms.. She has an albuterol inhaler that she knows how to use but infrequently needs. We reviewed the time that she would use. She uses zyrtec prn.

## 2015-04-29 NOTE — Assessment & Plan Note (Signed)
Zyrtec prn. Not on nasal steroid.

## 2015-04-29 NOTE — Progress Notes (Signed)
Subjective:    Patient ID: [name] [name], female    DOB: 1931-12-15, 82 y.o.   MRN: [id number]  Asthma She complains of cough, shortness of breath and wheezing. Associated symptoms include postnasal drip. Pertinent negatives include no ear pain, fever, headaches, rhinorrhea, sneezing, sore throat or trouble swallowing. Her past medical history is significant for asthma.   82 yo woman, never smoker, hx of CAD, LBBB, A Fib, non-ischemic CM. She is referred for possible obstructive lung disease. She had a CXR that showed suspected hyperinflation. She has had a bit more trouble with allergy season causing persistent cough, mucous. She also has had URI's over the last 2 years - last longer and more symptomatic. PFT done 09/18/13 > moderate AFL with + BD response. Normal TLC. Decreased DLCO.   12/20/13 -- Follow up visit for dyspnea, moderate obstruction on PFT. Last time we did a trial of SABA before exercise to see if she would benefit > has n't used very frequently but she has noticed an improvement in her exercise tolerance. Rare wheeze w exertion.   08/15/14 -- follow-up visit for obstructive lung disease and associated exertional dyspnea. Suspect a component of long-standing but subclinical asthma that has finally caused her symptoms. She also has a history of coronary disease atrial fibrillation and an associated cardiomyopathy. She has used ventolin rarely, but also notes that she doesn't exert heavily. Has used it before walks.   ROV 04/29/15 -- never smoker but with a history of obstructive lung disease and associated exertional shortness of breath. Pacer placed for 3rd degree heart block - pacer placed. We've diagnosed her with mild chronic asthma.  She has zyrtec to take prn.  She states that her breathing has been ok, notes that she is less steady on her feet, has been exerting less so she hasn't noticed dyspnea. She is on metsinon for autonomic dysfxn.  She is on lisinopril - seems to be tolerating.   She has not used albuterol with any regularity.     Review of Systems  Constitutional: Negative for fever and unexpected weight change.  HENT: Positive for postnasal drip. Negative for congestion, dental problem, ear pain, nosebleeds, rhinorrhea, sinus pressure, sneezing, sore throat and trouble swallowing.   Eyes: Negative for redness and itching.  Respiratory: Positive for cough, shortness of breath and wheezing. Negative for chest tightness.   Cardiovascular: Positive for palpitations. Negative for leg swelling.  Gastrointestinal: Negative for nausea and vomiting.  Genitourinary: Negative for dysuria.  Musculoskeletal: Negative for joint swelling.  Skin: Negative for rash.  Neurological: Negative for headaches.  Hematological: Does not bruise/bleed easily.  Psychiatric/Behavioral: Negative for dysphoric mood. The patient is not nervous/anxious.        Objective:   Physical Exam Filed Vitals:   04/29/15 1200  BP: 128/70  Pulse: 61  Height: 5\' 7"  (1.702 m)  Weight: 147 lb (66.679 kg)  SpO2: 98%   Gen: Pleasant, well-nourished, in no distress,  normal affect  ENT: No lesions,  mouth clear,  oropharynx clear, no postnasal drip  Neck: No JVD, no TMG, no carotid bruits  Lungs: No use of accessory muscles, clear without rales or rhonchi, no wheezing  Cardiovascular: RRR, heart sounds normal, no murmur or gallops, no peripheral edema  Musculoskeletal: No deformities, no cyanosis or clubbing  Neuro: alert, non focal  Skin: Warm, no lesions or rashes      Assessment & Plan:  Asthma, intrinsic Stable at this time without any evidence of recent exacerbation or  regular daytime symptoms.. She has an albuterol inhaler that she knows how to use but infrequently needs. We reviewed the time that she would use. She uses zyrtec prn.   Allergic rhinitis Zyrtec prn. Not on nasal steroid.

## 2015-04-29 NOTE — Patient Instructions (Signed)
Please kep your albuterol available to use as needed Please continue to monitor and discuss your cardiac medications with Dr Caryl Comes.  Follow with Dr Lamonte Sakai in 6 months or sooner if you have any problems

## 2015-05-02 ENCOUNTER — Other Ambulatory Visit: Payer: Self-pay | Admitting: Family Medicine

## 2015-05-02 ENCOUNTER — Telehealth: Payer: Self-pay | Admitting: *Deleted

## 2015-05-02 NOTE — Telephone Encounter (Signed)
Left message for patient at [phone] (Home #) to check to see how they were doing from a nail that was removed on Thursday, April 24, 2015. Waiting for a response.

## 2015-05-05 ENCOUNTER — Telehealth: Payer: Self-pay | Admitting: Family Medicine

## 2015-05-05 DIAGNOSIS — G3184 Mild cognitive impairment, so stated: Secondary | ICD-10-CM

## 2015-05-05 NOTE — Telephone Encounter (Signed)
Pt is set to be seen at the memory assessment clinic at [facility].  They need a formal consult request along with office notes and labs and demographic with a copy of the insurance. The fax number [phone] attn Blain Pais. Phone number to the clinic is [phone].

## 2015-05-05 NOTE — Telephone Encounter (Signed)
Will these just need to be entered under Amb referral to memory care?

## 2015-05-05 NOTE — Telephone Encounter (Signed)
i believe its neurology and you would add that under it

## 2015-05-06 NOTE — Telephone Encounter (Signed)
Deborah the referall has been entered, wasn't sure if you needed the below information.

## 2015-05-07 ENCOUNTER — Ambulatory Visit (INDEPENDENT_AMBULATORY_CARE_PROVIDER_SITE_OTHER): Payer: Medicare Other | Admitting: Podiatry

## 2015-05-07 ENCOUNTER — Encounter: Payer: Self-pay | Admitting: Podiatry

## 2015-05-07 VITALS — BP 144/60 | HR 63 | Resp 16

## 2015-05-07 DIAGNOSIS — M79673 Pain in unspecified foot: Secondary | ICD-10-CM | POA: Diagnosis not present

## 2015-05-07 DIAGNOSIS — B351 Tinea unguium: Secondary | ICD-10-CM

## 2015-05-07 DIAGNOSIS — L84 Corns and callosities: Secondary | ICD-10-CM

## 2015-05-08 ENCOUNTER — Ambulatory Visit (INDEPENDENT_AMBULATORY_CARE_PROVIDER_SITE_OTHER): Payer: Medicare Other | Admitting: Internal Medicine

## 2015-05-08 ENCOUNTER — Encounter: Payer: Self-pay | Admitting: Internal Medicine

## 2015-05-08 VITALS — BP 130/58 | HR 60 | Ht 67.0 in | Wt 148.0 lb

## 2015-05-08 DIAGNOSIS — I480 Paroxysmal atrial fibrillation: Secondary | ICD-10-CM

## 2015-05-08 DIAGNOSIS — Z950 Presence of cardiac pacemaker: Secondary | ICD-10-CM

## 2015-05-08 DIAGNOSIS — I442 Atrioventricular block, complete: Secondary | ICD-10-CM

## 2015-05-08 DIAGNOSIS — I255 Ischemic cardiomyopathy: Secondary | ICD-10-CM | POA: Diagnosis not present

## 2015-05-08 DIAGNOSIS — I5022 Chronic systolic (congestive) heart failure: Secondary | ICD-10-CM | POA: Diagnosis not present

## 2015-05-08 LAB — CUP PACEART INCLINIC DEVICE CHECK
Battery Voltage: 2.99 V
Brady Statistic RA Percent Paced: 23 %
Implantable Lead Implant Date: 20141024
Implantable Lead Implant Date: 20141024
Implantable Lead Implant Date: 20141024
Lead Channel Impedance Value: 387.5 Ohm
Lead Channel Impedance Value: 450 Ohm
Lead Channel Pacing Threshold Amplitude: 0.75 V
Lead Channel Pacing Threshold Pulse Width: 0.4 ms
Lead Channel Pacing Threshold Pulse Width: 0.5 ms
Lead Channel Sensing Intrinsic Amplitude: 12 mV
Lead Channel Setting Pacing Amplitude: 2.5 V
Lead Channel Setting Pacing Amplitude: 2.75 V
Lead Channel Setting Pacing Pulse Width: 0.4 ms
MDC IDC LEAD LOCATION: 753858
MDC IDC LEAD LOCATION: 753859
MDC IDC LEAD LOCATION: 753860
MDC IDC MSMT BATTERY REMAINING LONGEVITY: 85.2
MDC IDC MSMT LEADCHNL LV IMPEDANCE VALUE: 1000 Ohm
MDC IDC MSMT LEADCHNL LV PACING THRESHOLD AMPLITUDE: 1.75 V
MDC IDC MSMT LEADCHNL LV PACING THRESHOLD AMPLITUDE: 1.75 V
MDC IDC MSMT LEADCHNL LV PACING THRESHOLD PULSEWIDTH: 0.5 ms
MDC IDC MSMT LEADCHNL RA PACING THRESHOLD AMPLITUDE: 0.75 V
MDC IDC MSMT LEADCHNL RA PACING THRESHOLD AMPLITUDE: 0.75 V
MDC IDC MSMT LEADCHNL RA PACING THRESHOLD PULSEWIDTH: 0.4 ms
MDC IDC MSMT LEADCHNL RA PACING THRESHOLD PULSEWIDTH: 0.4 ms
MDC IDC MSMT LEADCHNL RA SENSING INTR AMPL: 2.2 mV
MDC IDC MSMT LEADCHNL RV PACING THRESHOLD AMPLITUDE: 0.75 V
MDC IDC MSMT LEADCHNL RV PACING THRESHOLD PULSEWIDTH: 0.4 ms
MDC IDC PG MODEL: 3242
MDC IDC PG SERIAL: 7516165
MDC IDC SESS DTM: 20170126163031
MDC IDC SET LEADCHNL LV PACING PULSEWIDTH: 0.5 ms
MDC IDC SET LEADCHNL RA PACING AMPLITUDE: 2 V
MDC IDC SET LEADCHNL RV SENSING SENSITIVITY: 5 mV
MDC IDC STAT BRADY RV PERCENT PACED: 98 %

## 2015-05-08 NOTE — Progress Notes (Signed)
Subjective:     Patient ID: [name] [name], female   DOB: Dec 16, 1931, 82 y.o.   MRN: [id number]  HPI patient states I wanted my big toenail checked on my left and I cannot cut my toenails and they are thick but not painful. On the right plantar foot there is a lesion that is painful   Review of Systems     Objective:   Physical Exam  neurovascular status intact with nailbed left that is healing well with minimal drainage and crusted over with nail elongation 1-5 both feet with thickness that's localized with patient noted to have keratotic lesion submetatarsal right    Assessment:      doing well with mycotic nail and infection without pain and lesion formation    Plan:      explained its normal to have some crust on the dorsal left hallux nail but it appears to be healing well and today I debrided nailbeds 1-5 right 2-5 left and debris did calluses with no iatrogenic bleeding noted

## 2015-05-08 NOTE — Patient Instructions (Addendum)
Medication Instructions Take Mestininon 60 mg 1 tablet by mouth twice daily  Labwork: None  Procedures/Testing: None  Follow-Up: Remote monitoring is used to monitor your Pacemaker of ICD from home. This monitoring reduces the number of office visits required to check your device to one time per year. It allows [place] to keep an eye on the functioning of your device to ensure it is working properly. You are scheduled for a device check from home on 08/07/2015. You may send your transmission at any time that day. If you have a wireless device, the transmission will be sent automatically. After your physician reviews your transmission, you will receive a postcard with your next transmission date.  Your physician wants you to follow-up in 1 year with Dr. Caryl Comes. You will receive a reminder letter in the mail two months in advance. If you don't receive a letter, please call our office to schedule the follow-up appointment.   Any Additional Special Instructions Will Be Listed Below (If Applicable).     If you need a refill on your cardiac medications before your next appointment, please call your pharmacy.

## 2015-05-08 NOTE — Progress Notes (Signed)
Patient Care Team: Marin Olp, MD as PCP - General (Family Medicine)   HPI  [name] [name] is a 82 y.o. female Seen in followup for syncope which was in part orthostatic and probably related to orthostatic high-grade heart block. She underwent CRT P.. She has evolved device dependence  She has   ischemic heart disease with prior MI and bypass surgery prior PCI to the LAD and previously normal left ventricular function. However, on evaluation 4/14 echocardiogram demonstrated EF 25-30%. LHC 4/14 Proximal LAD stent patent, LIMA-LAD atretic, D1 occluded, proximal circumflex 30%, mid RCA occluded, SVG-D1 normal, SVG-OM2 normal, SVG-distal RCA normal, EF 40% with diffuse HK   Echocardiogram 2/15 demonstrated ejection fraction of 30-35%   She has a history of atrial fibrillation that persisted for some years after her bypass surgery recurrent atrial fibrillation was detected. Apixaban has been started.  There has been an issue of a stroke in the past although never confirmed. In this regard notable that the MRI was negative    she has had recurrent problems of orthostatic intolerance.  She is on Mestinon. She is taking it 3 times a day. She never really pursue an abdominal binder. Her biggest issues of dizziness related to bending over take care of the cat's food and or liver box.         Past Medical History  Diagnosis Date  . MELANOMA 09/06/2008  . HYPERLIPIDEMIA 10/06/2007  . HYPERTENSION 10/06/2007  . MYOCARDIAL INFARCTION, HX OF 10/06/2007  . CORONARY ARTERY DISEASE 10/06/2007    a. s/p PCI to LAD; b. s/p CABG; c. LHC 07/13/12: Proximal LAD stent patent, LIMA-LAD atretic, D1 occluded, proximal circumflex 30%, mid RCA occluded, SVG-D1 normal, SVG-OM2 normal, SVG-distal RCA normal, EF 40% with diffuse HK  . LBBB 09/06/2008  . Atrial fibrillation ([place]) 10/06/2007    post op  . VARICOSE VEIN 09/29/2009  . UTERINE CANCER, HX OF 09/06/2008  . PERSONAL HX COLONIC POLYPS 10/30/2009  .  Syncope   . Dizziness   . NICM (nonischemic cardiomyopathy) ([place])     Echocardiogram 07/12/12: EF 25-30%, diffuse HK, mild AI, mild MR, mild LAE  . COPD (chronic obstructive pulmonary disease) ([place])     Emphysema on 02/03/13 CXR  . History of colonic polyps 10/30/2009    No polyps in 2011. No repeat.      Past Surgical History  Procedure Laterality Date  . Oophorectomy    . Right distal pretibeal      melanoma  . Coronary angioplasty with stent placement  2009  . Coronary artery bypass graft  2004  . Abdominal hysterectomy  1988  . Cardiac catheterization  08/30/2008  . Bi-ventricular pacemaker insertion (crt-p)  02/02/2013    St. Jude, serial no. [id number]   . Left heart catheterization with coronary angiogram Bilateral 07/13/2012    Procedure: LEFT HEART CATHETERIZATION WITH CORONARY ANGIOGRAM;  Surgeon: Peter M Martinique, MD;  Location: [facility] CATH LAB;  Service: Cardiovascular;  Laterality: Bilateral;  . Permanent pacemaker insertion N/A 02/02/2013    Procedure: PERMANENT PACEMAKER INSERTION;  Surgeon: Evans Lance, MD;  Location: [facility] CATH LAB;  Service: Cardiovascular;  Laterality: N/A;    Current Outpatient Prescriptions  Medication Sig Dispense Refill  . albuterol (VENTOLIN HFA) 108 (90 BASE) MCG/ACT inhaler Inhale 2 puffs into the lungs every 4 (four) hours as needed. 1 Inhaler 6  . apixaban (ELIQUIS) 5 MG TABS tablet Take 1 tablet (5 mg total) by mouth 2 (two) times daily.  60 tablet 6  . atorvastatin (LIPITOR) 20 MG tablet TAKE 1 TABLET DAILY 90 tablet 1  . carvedilol (COREG) 3.125 MG tablet TAKE 1 TABLET BY MOUTH TWICE DAILY WITH A MEAL. 180 tablet 1  . escitalopram (LEXAPRO) 10 MG tablet TAKE 1 TABLET BY MOUTH DAILY 30 tablet 5  . lisinopril (PRINIVIL,ZESTRIL) 2.5 MG tablet TAKE 1 TABLET BY MOUTH EVERY DAY 30 tablet 0  . pyridostigmine (MESTINON) 60 MG tablet Take 30 mg by mouth 3 (three) times daily.  5   No current facility-administered medications for this visit.     Allergies  Allergen Reactions  . Nitroglycerin Other (See Comments)    sensitive to tabs which causes rapid drop in blood pressure. Is ok to use oral spray form    Review of Systems negative except from HPI and PMH  Physical Exam BP 130/58 mmHg  Pulse 60  Ht 5\' 7"  (1.702 m)  Wt 148 lb (67.132 kg)  BMI 23.17 kg/m2  SpO2 96% Well developed and nourished in no acute distress HENT normal Neck supple with JVP-flat Clear Device pocket well healed; without hematoma or erythema.  There is no tethering  Regular rate and rhythm, no murmurs or gallops Abd-soft with active BS No Clubbing cyanosis edema Skin-warm and dry A & Oriented  Grossly normal sensory and motor function  ECG demonstrates P. synchronous pacing with frequent PVCs with a right bundle branch indeterminate axis morphology Rhythm strip of 40 seconds demonstrated isolated PVC  Assessment and  Plan  Hypertension  Complete heart block  Orthostatic intolerance  CHF chronic systolic  Atrial fibrillation paroysmal  PVCs-right bundle branch block indeterminate axis  Pacemaker-St. Jude-CRT The patient's device was interrogated.  The information was reviewed. No changes were made in the programming.     Ischemic heart myopathy with prior bypass  Without symptoms of ischemia    She is tolerating apixoban  I worry about falls and we have discussed prevention techniques  We spent more than 50% of our >25 min visit in face to face counseling regarding the above   Euvolemic continue current meds

## 2015-05-15 ENCOUNTER — Other Ambulatory Visit: Payer: Self-pay

## 2015-05-15 MED ORDER — PYRIDOSTIGMINE BROMIDE 60 MG PO TABS: 60.0000 mg | ORAL_TABLET | Freq: Two times a day (BID) | ORAL | Status: DC

## 2015-06-26 ENCOUNTER — Ambulatory Visit (INDEPENDENT_AMBULATORY_CARE_PROVIDER_SITE_OTHER): Payer: Medicare Other | Admitting: Family Medicine

## 2015-06-26 ENCOUNTER — Encounter: Payer: Self-pay | Admitting: Family Medicine

## 2015-06-26 VITALS — BP 110/70 | HR 60 | Temp 98.9°F | Wt 149.0 lb

## 2015-06-26 DIAGNOSIS — R05 Cough: Secondary | ICD-10-CM

## 2015-06-26 DIAGNOSIS — R062 Wheezing: Secondary | ICD-10-CM | POA: Diagnosis not present

## 2015-06-26 DIAGNOSIS — R52 Pain, unspecified: Secondary | ICD-10-CM | POA: Diagnosis not present

## 2015-06-26 DIAGNOSIS — J209 Acute bronchitis, unspecified: Secondary | ICD-10-CM

## 2015-06-26 DIAGNOSIS — R059 Cough, unspecified: Secondary | ICD-10-CM

## 2015-06-26 LAB — POCT INFLUENZA A: RAPID INFLUENZA A AGN: NEGATIVE

## 2015-06-26 MED ORDER — PREDNISONE 10 MG PO TABS: ORAL_TABLET | ORAL | Status: DC

## 2015-06-26 NOTE — Patient Instructions (Signed)
Please use albuterol as needed, rest, increase fluids, and start prednisone today to decrease wheezing and improve cough. If your symptoms do not improve, worsen,  or you develop a fever >101, please contact clinic for further evaluation.    When using your inhaler please follow these directions:  1.  First blow out your air (exhale) 2.  Administer one puff and breath deeply while counting to 10. 3. Exhale 4. Administer 2nd puff and breath deeply while counting to 10. 5. Exhale

## 2015-06-26 NOTE — Progress Notes (Signed)
Subjective:    Patient ID: [name] [name], female    DOB: November 22, 1931, 82 y.o.   MRN: [id number]  HPI  [name] [name] is an 82 year old female who presents today with generalized body aches, fatigue, rhinitis with white/clear drainage, sore  throat, wheezing, and productive cough with clear sputum for 4 days. She denies fever, chills, sweats, sinus pressure/pain, N/V/D, and ear pain. Treatments at home include Nyquil  and albuterol x 1 which provided limited benefit. She reports having cats in her home and denies history of asthma but is followed by pulmonology and states that she has been prescribed albuterol and zyrtec if needed. She has mild cognitive impairment, denies recent sick contacts or recent antibiotic use.   Review of Systems  Constitutional: Positive for fatigue. Negative for fever and chills.  HENT: Positive for congestion, postnasal drip and rhinorrhea. Negative for ear pain, nosebleeds, sinus pressure and sneezing.        PND  Respiratory: Positive for cough and wheezing. Negative for shortness of breath.   Cardiovascular: Negative for chest pain, palpitations and leg swelling.  Gastrointestinal: Negative for nausea, vomiting, abdominal pain, diarrhea and constipation.  Genitourinary: Negative for dysuria.  Musculoskeletal: Negative for myalgias and arthralgias.       Generalized body aches  Skin: Negative for pallor and rash.  Allergic/Immunologic: Positive for environmental allergies.  Neurological: Negative for dizziness, light-headedness and headaches.   Past Medical History  Diagnosis Date  . MELANOMA 09/06/2008  . HYPERLIPIDEMIA 10/06/2007  . HYPERTENSION 10/06/2007  . MYOCARDIAL INFARCTION, HX OF 10/06/2007  . CORONARY ARTERY DISEASE 10/06/2007    a. s/p PCI to LAD; b. s/p CABG; c. LHC 07/13/12: Proximal LAD stent patent, LIMA-LAD atretic, D1 occluded, proximal circumflex 30%, mid RCA occluded, SVG-D1 normal, SVG-OM2 normal, SVG-distal RCA normal, EF 40% with diffuse HK    . LBBB 09/06/2008  . Atrial fibrillation ([place]) 10/06/2007    post op  . VARICOSE VEIN 09/29/2009  . UTERINE CANCER, HX OF 09/06/2008  . PERSONAL HX COLONIC POLYPS 10/30/2009  . Syncope   . Dizziness   . NICM (nonischemic cardiomyopathy) ([place])     Echocardiogram 07/12/12: EF 25-30%, diffuse HK, mild AI, mild MR, mild LAE  . COPD (chronic obstructive pulmonary disease) ([place])     Emphysema on 02/03/13 CXR  . History of colonic polyps 10/30/2009    No polyps in 2011. No repeat.      Social History   Social History  . Marital Status: Widowed    Spouse Name: N/A  . Number of Children: N/A  . Years of Education: N/A   Occupational History  . Not on file.   Social History Main Topics  . Smoking status: Never Smoker   . Smokeless tobacco: Never Used  . Alcohol Use: Yes     Comment: daily gin or wine  . Drug Use: No  . Sexual Activity: Not on file   Other Topics Concern  . Not on file   Social History Narrative   Widowed. Lives alone with cat. 3 children. 6 grandkids   Daughter  (NP) and son in Sports coach (pharmacist-teaches at [employer]) that live close and help out.       Drives in the daytime, shops for herself, cleaning-has someone over to clean due to the intermittent lightheadedness, bathing      Retired from physical education.           Past Surgical History  Procedure Laterality Date  . Oophorectomy    .  Right distal pretibeal      melanoma  . Coronary angioplasty with stent placement  2009  . Coronary artery bypass graft  2004  . Abdominal hysterectomy  1988  . Cardiac catheterization  08/30/2008  . Bi-ventricular pacemaker insertion (crt-p)  02/02/2013    St. Jude, serial no. [id number]   . Left heart catheterization with coronary angiogram Bilateral 07/13/2012    Procedure: LEFT HEART CATHETERIZATION WITH CORONARY ANGIOGRAM;  Surgeon: Peter M Martinique, MD;  Location: [facility] CATH LAB;  Service: Cardiovascular;  Laterality: Bilateral;  . Permanent pacemaker insertion N/A  02/02/2013    Procedure: PERMANENT PACEMAKER INSERTION;  Surgeon: Evans Lance, MD;  Location: [facility] CATH LAB;  Service: Cardiovascular;  Laterality: N/A;    Family History  Problem Relation Age of Onset  . Stroke Mother   . Heart attack Father   . Colon cancer Neg Hx     Allergies  Allergen Reactions  . Nitroglycerin Other (See Comments)    sensitive to tabs which causes rapid drop in blood pressure. Is ok to use oral spray form    Current Outpatient Prescriptions on File Prior to Visit  Medication Sig Dispense Refill  . albuterol (VENTOLIN HFA) 108 (90 BASE) MCG/ACT inhaler Inhale 2 puffs into the lungs every 4 (four) hours as needed. 1 Inhaler 6  . apixaban (ELIQUIS) 5 MG TABS tablet Take 1 tablet (5 mg total) by mouth 2 (two) times daily. 60 tablet 6  . atorvastatin (LIPITOR) 20 MG tablet TAKE 1 TABLET DAILY 90 tablet 1  . carvedilol (COREG) 3.125 MG tablet TAKE 1 TABLET BY MOUTH TWICE DAILY WITH A MEAL. 180 tablet 1  . escitalopram (LEXAPRO) 10 MG tablet TAKE 1 TABLET BY MOUTH DAILY 30 tablet 5  . lisinopril (PRINIVIL,ZESTRIL) 2.5 MG tablet TAKE 1 TABLET BY MOUTH EVERY DAY 30 tablet 0  . pyridostigmine (MESTINON) 60 MG tablet Take 1 tablet (60 mg total) by mouth 2 (two) times daily. 60 tablet 3   No current facility-administered medications on file prior to visit.    BP 110/70 mmHg  Pulse 60  Temp(Src) 98.9 F (37.2 C) (Oral)  Wt 149 lb (67.586 kg)     Objective:   Physical Exam  Constitutional: She is oriented to person, place, and time.  HENT:  Right Ear: Tympanic membrane normal.  Left Ear: Tympanic membrane normal.  Nose: Rhinorrhea present. Right sinus exhibits no maxillary sinus tenderness and no frontal sinus tenderness. Left sinus exhibits no maxillary sinus tenderness and no frontal sinus tenderness.  Mouth/Throat: Mucous membranes are normal. No oropharyngeal exudate or posterior oropharyngeal erythema.  Eyes: Pupils are equal, round, and reactive to light.    Cardiovascular: Normal rate and regular rhythm.   No murmur heard. Pulmonary/Chest: Effort normal. She has wheezes.  Lymphadenopathy:    She has cervical adenopathy.  Neurological: She is alert and oriented to person, place, and time.  Mild cognitive impairment noted by patient   Skin: Skin is warm and dry. No rash noted. No pallor.  Psychiatric: She has a normal mood and affect. Her behavior is normal.       Assessment & Plan:  1. Cough Prednisone has been prescribed and she can use Mucinex DM OTC as needed. Discussed the mechanism of action for albuterol and advised patient to use as prescribed for wheezing as needed. - POCT Influenza A  2. Body aches  - POCT Influenza A  3. Acute bronchitis, unspecified organism Negative flu screening today. Suspect acute bronchitis or  exacerbation of possible mild asthma symptoms. Discussed with patient the use of albuterol and provided verbal and written instructions regarding proper use of inhaler.   Advised patient on supportive measures:  Get rest, drink plenty of fluids, and use albuterol as needed. Follow up if fever >101, if symptoms worsen or if symptoms are not improved in 3-4 days. Patient verbalizes understanding.  - predniSONE (DELTASONE) 10 MG tablet; Take 4 tablets by mouth once daily for 2 days, 3 tabs once daily for 2 days, 2 tabs once daily for 2 days, and 1 tab once daily for 2 days.  Dispense: 20 tablet; Refill: 0

## 2015-06-26 NOTE — Progress Notes (Signed)
Pre visit review using our clinic review tool, if applicable. No additional management support is needed unless otherwise documented below in the visit note. 

## 2015-07-03 ENCOUNTER — Telehealth: Payer: Self-pay | Admitting: Family Medicine

## 2015-07-03 MED ORDER — BENZONATATE 100 MG PO CAPS: 100.0000 mg | ORAL_CAPSULE | Freq: Two times a day (BID) | ORAL | Status: DC | PRN

## 2015-07-03 NOTE — Telephone Encounter (Signed)
Please see message and advise 

## 2015-07-03 NOTE — Telephone Encounter (Signed)
Sent in [place]- please inform patient. If continued issues past this prescription happy to see he rback

## 2015-07-03 NOTE — Telephone Encounter (Signed)
Pt saw julie on 3-16 and was dx with bronchitis  still has the cough and would like cough med call into [facility]

## 2015-07-04 NOTE — Telephone Encounter (Signed)
Patient is aware and picked up the prescription yesterday.

## 2015-08-07 ENCOUNTER — Ambulatory Visit (INDEPENDENT_AMBULATORY_CARE_PROVIDER_SITE_OTHER): Payer: Medicare Other | Admitting: *Deleted

## 2015-08-07 DIAGNOSIS — Z950 Presence of cardiac pacemaker: Secondary | ICD-10-CM

## 2015-08-07 DIAGNOSIS — I442 Atrioventricular block, complete: Secondary | ICD-10-CM

## 2015-08-07 NOTE — Progress Notes (Signed)
Remote pacemaker transmission.   

## 2015-08-12 ENCOUNTER — Encounter: Payer: Self-pay | Admitting: Podiatry

## 2015-08-12 ENCOUNTER — Ambulatory Visit (INDEPENDENT_AMBULATORY_CARE_PROVIDER_SITE_OTHER): Payer: Medicare Other | Admitting: Podiatry

## 2015-08-12 DIAGNOSIS — M79676 Pain in unspecified toe(s): Secondary | ICD-10-CM

## 2015-08-12 DIAGNOSIS — B351 Tinea unguium: Secondary | ICD-10-CM

## 2015-08-12 DIAGNOSIS — Q828 Other specified congenital malformations of skin: Secondary | ICD-10-CM

## 2015-08-12 NOTE — Progress Notes (Signed)
Patient ID: [name] [name], female   DOB: 01/06/1932, 82 y.o.   MRN: [id number] Complaint:  Visit Type: Patient returns to my office for continued preventative foot care services. Complaint: Patient states" my nails have grown long and thick and become painful to walk and wear shoes" . The patient presents for preventative foot care services. No changes to ROS.  Patient says the callus on her right forefoot is extremely painful when walking.  Podiatric Exam: Vascular: dorsalis pedis and posterior tibial pulses are palpable bilateral. Capillary return is immediate. Temperature gradient is WNL. Skin turgor WNL  Sensorium: Normal Semmes Weinstein monofilament test. Normal tactile sensation bilaterally. Nail Exam: Pt has thick disfigured discolored nails with subungual debris noted bilateral entire nail hallux through fifth toenails Ulcer Exam: There is no evidence of ulcer or pre-ulcerative changes or infection. Orthopedic Exam: Muscle tone and strength are WNL. No limitations in general ROM. No crepitus or effusions noted. Foot type and digits show no abnormalities. Plantarflexed third metatarsal right foot. Skin: No Porokeratosis. No infection or ulcers  Diagnosis:  Onychomycosis, , Pain in right toe, pain in left toes, Porokeratosis right foot.  Treatment & Plan Procedures and Treatment: Consent by patient was obtained for treatment procedures. The patient understood the discussion of treatment and procedures well. All questions were answered thoroughly reviewed. Debridement of mycotic and hypertrophic toenails, 1 through 5 bilateral and clearing of subungual debris. No ulceration, no infection noted. Dispersion padding right forefoot. Return Visit-Office Procedure: Patient instructed to return to the office for a follow up visit 3 months for continued evaluation and treatment.    Gardiner Barefoot DPM

## 2015-08-26 ENCOUNTER — Other Ambulatory Visit (INDEPENDENT_AMBULATORY_CARE_PROVIDER_SITE_OTHER): Payer: Medicare Other

## 2015-08-26 DIAGNOSIS — R319 Hematuria, unspecified: Secondary | ICD-10-CM

## 2015-08-26 DIAGNOSIS — Z0000 Encounter for general adult medical examination without abnormal findings: Secondary | ICD-10-CM | POA: Diagnosis not present

## 2015-08-26 LAB — LIPID PANEL
CHOLESTEROL: 146 mg/dL (ref 0–200)
HDL: 49.9 mg/dL (ref >39.00)
LDL Cholesterol: 84 mg/dL (ref 0–99)
NonHDL: 96.46
TRIGLYCERIDES: 60 mg/dL (ref 0.0–149.0)
Total CHOL/HDL Ratio: 3
VLDL: 12 mg/dL (ref 0.0–40.0)

## 2015-08-26 LAB — HEPATIC FUNCTION PANEL
ALT: 9 U/L (ref 0–35)
AST: 17 U/L (ref 0–37)
Albumin: 4.2 g/dL (ref 3.5–5.2)
Alkaline Phosphatase: 51 U/L (ref 39–117)
BILIRUBIN TOTAL: 1 mg/dL (ref 0.2–1.2)
Bilirubin, Direct: 0.2 mg/dL (ref 0.0–0.3)
Total Protein: 5.8 g/dL — ABNORMAL LOW (ref 6.0–8.3)

## 2015-08-26 LAB — URINALYSIS, MICROSCOPIC ONLY

## 2015-08-26 LAB — CBC WITH DIFFERENTIAL/PLATELET
Basophils Absolute: 0 10*3/uL (ref 0.0–0.1)
Basophils Relative: 0.6 % (ref 0.0–3.0)
EOS PCT: 2.7 % (ref 0.0–5.0)
Eosinophils Absolute: 0.1 10*3/uL (ref 0.0–0.7)
HEMATOCRIT: 39.9 % (ref 36.0–46.0)
HEMOGLOBIN: 13.1 g/dL (ref 12.0–15.0)
LYMPHS PCT: 23.9 % (ref 12.0–46.0)
Lymphs Abs: 1.3 10*3/uL (ref 0.7–4.0)
MCHC: 32.9 g/dL (ref 30.0–36.0)
MCV: 90.1 fl (ref 78.0–100.0)
MONO ABS: 0.3 10*3/uL (ref 0.1–1.0)
MONOS PCT: 6 % (ref 3.0–12.0)
NEUTROS PCT: 66.8 % (ref 43.0–77.0)
Neutro Abs: 3.7 10*3/uL (ref 1.4–7.7)
Platelets: 128 10*3/uL — ABNORMAL LOW (ref 150.0–400.0)
RBC: 4.43 Mil/uL (ref 3.87–5.11)
RDW: 15 % (ref 11.5–15.5)
WBC: 5.5 10*3/uL (ref 4.0–10.5)

## 2015-08-26 LAB — POC URINALSYSI DIPSTICK (AUTOMATED)
BILIRUBIN UA: NEGATIVE
Glucose, UA: NEGATIVE
Ketones, UA: NEGATIVE
LEUKOCYTES UA: NEGATIVE
NITRITE UA: NEGATIVE
PH UA: 7
PROTEIN UA: NEGATIVE
Spec Grav, UA: 1.015
UROBILINOGEN UA: 0.2

## 2015-08-26 LAB — BASIC METABOLIC PANEL
BUN: 13 mg/dL (ref 6–23)
CHLORIDE: 102 meq/L (ref 96–112)
CO2: 31 meq/L (ref 19–32)
Calcium: 9 mg/dL (ref 8.4–10.5)
Creatinine, Ser: 0.68 mg/dL (ref 0.40–1.20)
GFR: 88.08 mL/min (ref >60.00)
GLUCOSE: 92 mg/dL (ref 70–99)
POTASSIUM: 4.2 meq/L (ref 3.5–5.1)
SODIUM: 138 meq/L (ref 135–145)

## 2015-08-26 LAB — TSH: TSH: 1.66 u[IU]/mL (ref 0.35–4.50)

## 2015-09-02 ENCOUNTER — Ambulatory Visit (INDEPENDENT_AMBULATORY_CARE_PROVIDER_SITE_OTHER): Payer: Medicare Other | Admitting: Family Medicine

## 2015-09-02 ENCOUNTER — Encounter: Payer: Self-pay | Admitting: Family Medicine

## 2015-09-02 VITALS — BP 110/68 | HR 65 | Temp 98.0°F | Ht 67.0 in | Wt 143.0 lb

## 2015-09-02 DIAGNOSIS — I480 Paroxysmal atrial fibrillation: Secondary | ICD-10-CM | POA: Diagnosis not present

## 2015-09-02 DIAGNOSIS — G3184 Mild cognitive impairment, so stated: Secondary | ICD-10-CM

## 2015-09-02 DIAGNOSIS — I442 Atrioventricular block, complete: Secondary | ICD-10-CM | POA: Diagnosis not present

## 2015-09-02 DIAGNOSIS — R3129 Other microscopic hematuria: Secondary | ICD-10-CM | POA: Insufficient documentation

## 2015-09-02 DIAGNOSIS — I5042 Chronic combined systolic (congestive) and diastolic (congestive) heart failure: Secondary | ICD-10-CM | POA: Diagnosis not present

## 2015-09-02 DIAGNOSIS — Z0001 Encounter for general adult medical examination with abnormal findings: Secondary | ICD-10-CM | POA: Diagnosis not present

## 2015-09-02 DIAGNOSIS — H9193 Unspecified hearing loss, bilateral: Secondary | ICD-10-CM

## 2015-09-02 DIAGNOSIS — I2510 Atherosclerotic heart disease of native coronary artery without angina pectoris: Secondary | ICD-10-CM

## 2015-09-02 NOTE — Patient Instructions (Addendum)
We will call you within a week about your referral to ENT for evaluation of hearing loss. If you do not hear within 2 weeks, give [place] a call.   No changes in medication today  I would also like for you to sign up for an annual wellness visit on a Friday with our nurse Manuela Schwartz. This is a free benefit under medicare that may help [place] find additional ways to help you.  You mentioned that Friday at 2 o clock may work for you- please schedule at check out

## 2015-09-02 NOTE — Assessment & Plan Note (Signed)
3-6 per HPF for 7 years on microscopic. Declines workup unless worsening. She is aware malignancy risk.

## 2015-09-02 NOTE — Assessment & Plan Note (Signed)
MMSE 28 which is stable to improved. Monitor every 6-12 months

## 2015-09-02 NOTE — Progress Notes (Signed)
Phone: [phone]  Subjective:  Patient presents today for their annual physical. Chief complaint-noted.   See problem oriented charting- ROS- full  review of systems was completed and negative including no chest pain or shortness of breath. Rare palpitations.   The following were reviewed and entered/updated in epic: Past Medical History  Diagnosis Date  . MELANOMA 09/06/2008  . HYPERLIPIDEMIA 10/06/2007  . HYPERTENSION 10/06/2007  . MYOCARDIAL INFARCTION, HX OF 10/06/2007  . CORONARY ARTERY DISEASE 10/06/2007    a. s/p PCI to LAD; b. s/p CABG; c. LHC 07/13/12: Proximal LAD stent patent, LIMA-LAD atretic, D1 occluded, proximal circumflex 30%, mid RCA occluded, SVG-D1 normal, SVG-OM2 normal, SVG-distal RCA normal, EF 40% with diffuse HK  . LBBB 09/06/2008  . Atrial fibrillation ([place]) 10/06/2007    post op  . VARICOSE VEIN 09/29/2009  . UTERINE CANCER, HX OF 09/06/2008  . PERSONAL HX COLONIC POLYPS 10/30/2009  . Syncope   . Dizziness   . NICM (nonischemic cardiomyopathy) ([place])     Echocardiogram 07/12/12: EF 25-30%, diffuse HK, mild AI, mild MR, mild LAE  . COPD (chronic obstructive pulmonary disease) ([place])     Emphysema on 02/03/13 CXR  . History of colonic polyps 10/30/2009    No polyps in 2011. No repeat.     Patient Active Problem List   Diagnosis Date Noted  . CAD (coronary artery disease) 04/17/2014    Priority: High  . Mild cognitive impairment 04/17/2014    Priority: High  . Pacemaker -CRT-St. Jude 05/21/2013    Priority: High  . Atrioventricular block, complete ([place]) 03/08/2013    Priority: High  . Paroxysmal atrial fibrillation ([facility]) 02/03/2013    Priority: High  . Chronic combined systolic and diastolic congestive heart failure ([place]) 10/10/2012    Priority: High  . Ischemic cardiomyopathy  EF 30% cath 4/14 with stent 10/06/2007    Priority: High  . Microscopic hematuria 09/02/2015    Priority: Medium  . Asthma, intrinsic 02/03/2013    Priority: Medium  . Depression  09/19/2012    Priority: Medium  . Melanoma of skin ([place]) 09/06/2008    Priority: Medium  . Uterine cancer ([place]) 09/06/2008    Priority: Medium  . Hyperlipidemia 10/06/2007    Priority: Medium  . Essential hypertension 10/06/2007    Priority: Medium  . Allergic rhinitis 04/17/2014    Priority: Low  . Hearing loss 04/17/2014    Priority: Low  . Varicose veins 09/29/2009    Priority: Low  . LBBB (left bundle branch block) 09/06/2008    Priority: Low   Past Surgical History  Procedure Laterality Date  . Oophorectomy    . Right distal pretibeal      melanoma  . Coronary angioplasty with stent placement  2009  . Coronary artery bypass graft  2004  . Abdominal hysterectomy  1988  . Cardiac catheterization  08/30/2008  . Bi-ventricular pacemaker insertion (crt-p)  02/02/2013    St. Jude, serial no. [id number]   . Left heart catheterization with coronary angiogram Bilateral 07/13/2012    Procedure: LEFT HEART CATHETERIZATION WITH CORONARY ANGIOGRAM;  Surgeon: Peter M Martinique, MD;  Location: [facility] CATH LAB;  Service: Cardiovascular;  Laterality: Bilateral;  . Permanent pacemaker insertion N/A 02/02/2013    Procedure: PERMANENT PACEMAKER INSERTION;  Surgeon: Evans Lance, MD;  Location: [facility] CATH LAB;  Service: Cardiovascular;  Laterality: N/A;    Family History  Problem Relation Age of Onset  . Stroke Mother   . Heart attack Father   .  Colon cancer Neg Hx     Medications- reviewed and updated Current Outpatient Prescriptions  Medication Sig Dispense Refill  . albuterol (VENTOLIN HFA) 108 (90 BASE) MCG/ACT inhaler Inhale 2 puffs into the lungs every 4 (four) hours as needed. 1 Inhaler 6  . apixaban (ELIQUIS) 5 MG TABS tablet Take 1 tablet (5 mg total) by mouth 2 (two) times daily. 60 tablet 6  . atorvastatin (LIPITOR) 20 MG tablet TAKE 1 TABLET DAILY 90 tablet 1  . carvedilol (COREG) 3.125 MG tablet TAKE 1 TABLET BY MOUTH TWICE DAILY WITH A MEAL. 180 tablet 1  . escitalopram (LEXAPRO)  10 MG tablet TAKE 1 TABLET BY MOUTH DAILY 30 tablet 5  . lisinopril (PRINIVIL,ZESTRIL) 2.5 MG tablet TAKE 1 TABLET BY MOUTH EVERY DAY 30 tablet 0  . pyridostigmine (MESTINON) 60 MG tablet Take 1 tablet (60 mg total) by mouth 2 (two) times daily. 60 tablet 3   No current facility-administered medications for this visit.    Allergies-reviewed and updated Allergies  Allergen Reactions  . Nitroglycerin Other (See Comments)    sensitive to tabs which causes rapid drop in blood pressure. Is ok to use oral spray form    Social History   Social History  . Marital Status: Widowed    Spouse Name: N/A  . Number of Children: N/A  . Years of Education: N/A   Social History Main Topics  . Smoking status: Never Smoker   . Smokeless tobacco: Never Used  . Alcohol Use: Yes     Comment: daily gin or wine  . Drug Use: No  . Sexual Activity: Not Asked   Other Topics Concern  . None   Social History Narrative   Widowed. Lives alone with cat. 3 children. 6 grandkids   Daughter  (NP) and son in Sports coach (pharmacist-teaches at [employer]) that live close and help out.       Drives in the daytime, shops for herself, cleaning-has someone over to clean due to the intermittent lightheadedness, bathing      Retired from physical education.           Objective: BP 110/68 mmHg  Pulse 65  Temp(Src) 98 F (36.7 C) (Oral)  Ht 5\' 7"  (1.702 m)  Wt 143 lb (64.864 kg)  BMI 22.39 kg/m2  SpO2 98% Gen: NAD, resting comfortably HEENT: Mucous membranes are moist. Oropharynx normal Neck: no thyromegaly CV: RRR no murmurs rubs or gallops Lungs: CTAB no crackles, wheeze, rhonchi Abdomen: soft/nontender/nondistended/normal bowel sounds. No rebound or guarding.  Ext: no edema Skin: warm, dry Neuro: grossly normal, moves all extremities, PERRLA  Assessment/Plan:  82 y.o. female presenting for annual physical.  Health Maintenance counseling: 1. Anticipatory guidance: Patient counseled regarding regular  dental exams, eye exams, wearing seatbelts.  2. Risk factor reduction:  Advised patient of need for regular exercise and diet rich and fruits and vegetables to reduce risk of heart attack and stroke. Was doing exercise class on [place]- plans to go back.  3. Immunizations/screenings/ancillary studies Immunization History  Administered Date(s) Administered  . Influenza Split 01/15/2011, 01/03/2012  . Influenza,inj,Quad PF,36+ Mos 01/12/2013, 12/21/2013  . Influenza-Unspecified 01/11/2015  . Pneumococcal Conjugate-13 09/28/2013  . Pneumococcal Polysaccharide-23 04/12/2008  . Td 01/26/2010  . Zoster 04/13/2003  4. Cervical cancer screening- aged out 36. Breast cancer screening-  Aged out of screening recs 8. Colon cancer screening - last screening 12/10/2009 no recall due to ageh  Status of chronic or acute concerns  Cardiac Known systolic and diastolic  CHF ischemic in etiology. Follows with Dr. Caryl Comes. Not on lasix on regulary fortunately Atrial fibrillation- paroxysmal. Compliant with eliquis for stroke prevention. On coreg for paroxysms of a fib Complete AV block- s/p pacemaker 2014, doing well. Cards has on pyridostigmine- we did a 1x fill last request but should be through cards CAD with Cabg 2002. Compliant with plavix in past- appears this was removed when started on eliquis for a fib Hyperlipidemia- LDL at 84 on lipitor 20mg . She is concerned about memory loss so does not want to increase.  Hypertension- ontrolled on coreg 3.125mg   Depression- stable on lexapro 10mg . Sometimes gets lonely and has to spend time with friends/family and that helps  Asthma- stable with prn albuterol only.   Hearing loss- will refer to ENT. She requests this over audiology.   Microscopic hematuria 3-6 per HPF for 7 years on microscopic. Declines workup unless worsening. She is aware malignancy risk.   Mild cognitive impairment MMSE 28 which is stable to improved. Monitor every 6-12 months   Return  in about 4 months (around 01/03/2016) for follow up- or sooner if needed. Return precautions advised.   Orders Placed This Encounter  Procedures  . Ambulatory referral to ENT    Referral Priority:  Routine    Referral Type:  Consultation    Referral Reason:  Specialty Services Required    Requested Specialty:  Otolaryngology    Number of Visits Requested:  1   Garret Reddish, MD

## 2015-09-03 ENCOUNTER — Telehealth: Payer: Self-pay

## 2015-09-03 NOTE — Telephone Encounter (Signed)
fup with AWV; Spoke with [name] [name] to introduce AWV and educated that insurance can do a "well visit" but her doctor is accountable for her treatment plan and Medicare holds the practice accountable as well. The patient was assured she would not be billed as the AWV is covered within the practice; Was happy to make an apt for June 16th at [facility] home and could not come this Friday.

## 2015-09-05 ENCOUNTER — Other Ambulatory Visit: Payer: Self-pay | Admitting: *Deleted

## 2015-09-05 MED ORDER — LISINOPRIL 2.5 MG PO TABS: 2.5000 mg | ORAL_TABLET | Freq: Every day | ORAL | Status: DC

## 2015-09-10 ENCOUNTER — Other Ambulatory Visit: Payer: Self-pay

## 2015-09-10 ENCOUNTER — Telehealth: Payer: Self-pay | Admitting: Family Medicine

## 2015-09-10 MED ORDER — ATORVASTATIN CALCIUM 20 MG PO TABS: 20.0000 mg | ORAL_TABLET | Freq: Every day | ORAL | Status: DC

## 2015-09-10 NOTE — Telephone Encounter (Signed)
Pt need new Rx for atorvastatin 20 mg.  Pharm:  [facility] [employer] [facility]

## 2015-09-10 NOTE — Telephone Encounter (Signed)
Sent to pharmacy 

## 2015-09-12 ENCOUNTER — Encounter (HOSPITAL_COMMUNITY): Payer: Self-pay | Admitting: Emergency Medicine

## 2015-09-12 ENCOUNTER — Emergency Department (HOSPITAL_COMMUNITY)
Admission: EM | Admit: 2015-09-12 | Discharge: 2015-09-12 | Disposition: A | Discharge status: Home / Self Care | Payer: Medicare Other | Attending: Emergency Medicine | Admitting: Emergency Medicine

## 2015-09-12 DIAGNOSIS — I2510 Atherosclerotic heart disease of native coronary artery without angina pectoris: Secondary | ICD-10-CM | POA: Diagnosis not present

## 2015-09-12 DIAGNOSIS — S0101XA Laceration without foreign body of scalp, initial encounter: Secondary | ICD-10-CM | POA: Diagnosis not present

## 2015-09-12 DIAGNOSIS — Y999 Unspecified external cause status: Secondary | ICD-10-CM | POA: Insufficient documentation

## 2015-09-12 DIAGNOSIS — Y9289 Other specified places as the place of occurrence of the external cause: Secondary | ICD-10-CM | POA: Insufficient documentation

## 2015-09-12 DIAGNOSIS — Z955 Presence of coronary angioplasty implant and graft: Secondary | ICD-10-CM | POA: Insufficient documentation

## 2015-09-12 DIAGNOSIS — W5501XA Bitten by cat, initial encounter: Secondary | ICD-10-CM | POA: Diagnosis not present

## 2015-09-12 DIAGNOSIS — I252 Old myocardial infarction: Secondary | ICD-10-CM | POA: Diagnosis not present

## 2015-09-12 DIAGNOSIS — Z7901 Long term (current) use of anticoagulants: Secondary | ICD-10-CM | POA: Insufficient documentation

## 2015-09-12 DIAGNOSIS — J449 Chronic obstructive pulmonary disease, unspecified: Secondary | ICD-10-CM | POA: Insufficient documentation

## 2015-09-12 DIAGNOSIS — Y939 Activity, unspecified: Secondary | ICD-10-CM | POA: Insufficient documentation

## 2015-09-12 DIAGNOSIS — Z950 Presence of cardiac pacemaker: Secondary | ICD-10-CM | POA: Insufficient documentation

## 2015-09-12 DIAGNOSIS — I10 Essential (primary) hypertension: Secondary | ICD-10-CM | POA: Insufficient documentation

## 2015-09-12 DIAGNOSIS — Z79899 Other long term (current) drug therapy: Secondary | ICD-10-CM | POA: Diagnosis not present

## 2015-09-12 DIAGNOSIS — S0105XA Open bite of scalp, initial encounter: Secondary | ICD-10-CM | POA: Diagnosis present

## 2015-09-12 MED ORDER — ACETAMINOPHEN 325 MG PO TABS
650.0000 mg | ORAL_TABLET | Freq: Once | ORAL | Status: AC
  Administered 2015-09-12: 650 mg via ORAL
  Filled 2015-09-12: qty 2

## 2015-09-12 MED ORDER — AMOXICILLIN-POT CLAVULANATE 875-125 MG PO TABS: 1.0000 | ORAL_TABLET | Freq: Two times a day (BID) | ORAL | Status: DC

## 2015-09-12 NOTE — ED Provider Notes (Signed)
CSN: [id number]     Arrival date & time 09/12/15  0753 History   First MD Initiated Contact with Patient 09/12/15 0800     Chief Complaint  Patient presents with  . Animal Bite     (Consider location/radiation/quality/duration/timing/severity/associated sxs/prior Treatment) HPI Comments: 82 year old female history of high blood pressure, melanoma, asthma, atrial fibrillation, pacemaker presents with scalp laceration from cat bite this morning. The cat is 74 years old and has all shots up-to-date. No other injuries. Mild bleeding patient is on anticoagulant.  Patient is a 82 y.o. female presenting with animal bite. The history is provided by the patient.  Animal Bite Associated symptoms: no fever and no rash     Past Medical History  Diagnosis Date  . MELANOMA 09/06/2008  . HYPERLIPIDEMIA 10/06/2007  . HYPERTENSION 10/06/2007  . MYOCARDIAL INFARCTION, HX OF 10/06/2007  . CORONARY ARTERY DISEASE 10/06/2007    a. s/p PCI to LAD; b. s/p CABG; c. LHC 07/13/12: Proximal LAD stent patent, LIMA-LAD atretic, D1 occluded, proximal circumflex 30%, mid RCA occluded, SVG-D1 normal, SVG-OM2 normal, SVG-distal RCA normal, EF 40% with diffuse HK  . LBBB 09/06/2008  . Atrial fibrillation ([place]) 10/06/2007    post op  . VARICOSE VEIN 09/29/2009  . UTERINE CANCER, HX OF 09/06/2008  . PERSONAL HX COLONIC POLYPS 10/30/2009  . Syncope   . Dizziness   . NICM (nonischemic cardiomyopathy) ([place])     Echocardiogram 07/12/12: EF 25-30%, diffuse HK, mild AI, mild MR, mild LAE  . COPD (chronic obstructive pulmonary disease) ([place])     Emphysema on 02/03/13 CXR  . History of colonic polyps 10/30/2009    No polyps in 2011. No repeat.     Past Surgical History  Procedure Laterality Date  . Oophorectomy    . Right distal pretibeal      melanoma  . Coronary angioplasty with stent placement  2009  . Coronary artery bypass graft  2004  . Abdominal hysterectomy  1988  . Cardiac catheterization  08/30/2008  . Bi-ventricular  pacemaker insertion (crt-p)  02/02/2013    St. Jude, serial no. [id number]   . Left heart catheterization with coronary angiogram Bilateral 07/13/2012    Procedure: LEFT HEART CATHETERIZATION WITH CORONARY ANGIOGRAM;  Surgeon: Peter M Martinique, MD;  Location: [facility] CATH LAB;  Service: Cardiovascular;  Laterality: Bilateral;  . Permanent pacemaker insertion N/A 02/02/2013    Procedure: PERMANENT PACEMAKER INSERTION;  Surgeon: Evans Lance, MD;  Location: [facility] CATH LAB;  Service: Cardiovascular;  Laterality: N/A;   Family History  Problem Relation Age of Onset  . Stroke Mother   . Heart attack Father   . Colon cancer Neg Hx    Social History  Substance Use Topics  . Smoking status: Never Smoker   . Smokeless tobacco: Never Used  . Alcohol Use: Yes     Comment: daily gin or wine   OB History    No data available     Review of Systems  Constitutional: Negative for fever and chills.  HENT: Negative for congestion.   Eyes: Negative for visual disturbance.  Respiratory: Negative for shortness of breath.   Cardiovascular: Negative for chest pain.  Gastrointestinal: Negative for vomiting and abdominal pain.  Genitourinary: Negative for dysuria and flank pain.  Musculoskeletal: Negative for back pain, neck pain and neck stiffness.  Skin: Positive for wound. Negative for rash.  Neurological: Negative for light-headedness and headaches.      Allergies  Nitroglycerin  Home Medications   Prior to  Admission medications   Medication Sig Start Date End Date Taking? Authorizing Provider  albuterol (VENTOLIN HFA) 108 (90 BASE) MCG/ACT inhaler Inhale 2 puffs into the lungs every 4 (four) hours as needed. 10/01/13   Collene Gobble, MD  amoxicillin-clavulanate (AUGMENTIN) 875-125 MG tablet Take 1 tablet by mouth 2 (two) times daily. One po bid x 7 days 09/12/15   Elnora Morrison, MD  apixaban (ELIQUIS) 5 MG TABS tablet Take 1 tablet (5 mg total) by mouth 2 (two) times daily. 02/21/15   Deboraha Sprang, MD   atorvastatin (LIPITOR) 20 MG tablet Take 1 tablet (20 mg total) by mouth daily. 09/10/15   Marin Olp, MD  carvedilol (COREG) 3.125 MG tablet TAKE 1 TABLET BY MOUTH TWICE DAILY WITH A MEAL. 12/17/14   Deboraha Sprang, MD  escitalopram (LEXAPRO) 10 MG tablet TAKE 1 TABLET BY MOUTH DAILY 05/02/15   Marin Olp, MD  lisinopril (PRINIVIL,ZESTRIL) 2.5 MG tablet Take 1 tablet (2.5 mg total) by mouth daily. 09/05/15   Deboraha Sprang, MD  pyridostigmine (MESTINON) 60 MG tablet Take 1 tablet (60 mg total) by mouth 2 (two) times daily. 05/15/15   Marin Olp, MD   BP 150/72 mmHg  Pulse 71  Temp(Src) 98.3 F (36.8 C) (Oral)  Resp 16  SpO2 97% Physical Exam  Constitutional: She is oriented to person, place, and time. She appears well-developed and well-nourished.  HENT:  Head: Normocephalic.  Patient has proximally 3 cm scalp laceration right parietal area with mild bleeding and mild gaping.  Eyes: Right eye exhibits no discharge. Left eye exhibits no discharge.  Neck: Neck supple. No tracheal deviation present.  Cardiovascular: Normal rate.   Pulmonary/Chest: Effort normal.  Musculoskeletal: She exhibits no edema.  Neurological: She is alert and oriented to person, place, and time.  Skin: Skin is warm.  Psychiatric: She has a normal mood and affect.  Nursing note and vitals reviewed.   ED Course  Procedures (including critical care time) LACERATION REPAIR Performed by: Mariea Clonts Authorized by: Mariea Clonts Consent: Verbal consent obtained. Risks and benefits: risks, benefits and alternatives were discussed Consent given by: patient Patient identity confirmed: provided demographic data Prepped and Draped in normal sterile fashion Wound explored  Laceration Location: right scalp Laceration Length: 3 cm No Foreign Bodies seen or palpated Amount of cleaning: standard  Skin closure: approximated  Technique: 4 staples  Patient tolerance: Patient tolerated the  procedure well with no immediate complications.   Labs Review Labs Reviewed - No data to display  Imaging Review No results found. I have personally reviewed and evaluated these images and lab results as part of my medical decision-making.   EKG Interpretation None      MDM   Final diagnoses:  Cat bite, initial encounter   Scalp lack irrigated well, 4 staples placed. No other injuries. Supportive care and follow-up discussed  Results and differential diagnosis were discussed with the patient/parent/guardian. Xrays were independently reviewed by myself.  Close follow up outpatient was discussed, comfortable with the plan.   Medications  acetaminophen (TYLENOL) tablet 650 mg (650 mg Oral Given 09/12/15 0816)    Filed Vitals:   09/12/15 0803  BP: 150/72  Pulse: 71  Temp: 98.3 F (36.8 C)  TempSrc: Oral  Resp: 16  SpO2: 97%    Final diagnoses:  Cat bite, initial encounter        Elnora Morrison, MD 09/12/15 [phone]

## 2015-09-12 NOTE — Discharge Instructions (Signed)
Keep wound clean have staples removed in approximately 7-10 days. Watch for signs of infection such as purulent drainage, fevers. Take antibiotics as discussed. Take tylenol for pain.  If you were given medicines take as directed.  If you are on coumadin or contraceptives realize their levels and effectiveness is altered by many different medicines.  If you have any reaction (rash, tongues swelling, other) to the medicines stop taking and see a physician.    If your blood pressure was elevated in the ER make sure you follow up for management with a primary doctor or return for chest pain, shortness of breath or stroke symptoms.  Please follow up as directed and return to the ER or see a physician for new or worsening symptoms.  Thank you. Filed Vitals:   09/12/15 0803  BP: 150/72  Pulse: 71  Temp: 98.3 F (36.8 C)  TempSrc: Oral  Resp: 16  SpO2: 97%

## 2015-09-12 NOTE — ED Notes (Signed)
Pt was bit by pet cat this am. Cat stays indoors and has all its shots. Pt on eliquis. Pt has 3cm laceration to R top of head.

## 2015-09-17 ENCOUNTER — Encounter: Payer: Self-pay | Admitting: Cardiology

## 2015-09-18 ENCOUNTER — Other Ambulatory Visit: Payer: Self-pay | Admitting: *Deleted

## 2015-09-18 LAB — CUP PACEART REMOTE DEVICE CHECK
Battery Remaining Longevity: 97 mo
Battery Remaining Percentage: 95.5 %
Brady Statistic AP VP Percent: 26 %
Brady Statistic AP VS Percent: 1 %
Brady Statistic AS VS Percent: 1 %
Date Time Interrogation Session: 20170427080024
Implantable Lead Location: 753858
Implantable Lead Location: 753859
Implantable Lead Location: 753860
Lead Channel Impedance Value: 390 Ohm
Lead Channel Pacing Threshold Amplitude: 0.75 V
Lead Channel Pacing Threshold Amplitude: 0.75 V
Lead Channel Pacing Threshold Amplitude: 1.625 V
Lead Channel Pacing Threshold Pulse Width: 0.4 ms
Lead Channel Pacing Threshold Pulse Width: 0.4 ms
Lead Channel Sensing Intrinsic Amplitude: 1.2 mV
Lead Channel Setting Pacing Amplitude: 2.5 V
Lead Channel Setting Sensing Sensitivity: 5 mV
MDC IDC LEAD IMPLANT DT: 20141024
MDC IDC LEAD IMPLANT DT: 20141024
MDC IDC LEAD IMPLANT DT: 20141024
MDC IDC MSMT BATTERY VOLTAGE: 2.99 V
MDC IDC MSMT LEADCHNL LV IMPEDANCE VALUE: 960 Ohm
MDC IDC MSMT LEADCHNL LV PACING THRESHOLD PULSEWIDTH: 0.5 ms
MDC IDC MSMT LEADCHNL RV IMPEDANCE VALUE: 480 Ohm
MDC IDC MSMT LEADCHNL RV SENSING INTR AMPL: 12 mV
MDC IDC SET LEADCHNL LV PACING AMPLITUDE: 2.625
MDC IDC SET LEADCHNL LV PACING PULSEWIDTH: 0.5 ms
MDC IDC SET LEADCHNL RA PACING AMPLITUDE: 2 V
MDC IDC SET LEADCHNL RV PACING PULSEWIDTH: 0.4 ms
MDC IDC STAT BRADY AS VP PERCENT: 72 %
MDC IDC STAT BRADY RA PERCENT PACED: 24 %
Pulse Gen Model: 3242
Pulse Gen Serial Number: 7516165

## 2015-09-18 MED ORDER — CARVEDILOL 3.125 MG PO TABS: ORAL_TABLET | ORAL | Status: DC

## 2015-09-26 ENCOUNTER — Ambulatory Visit (INDEPENDENT_AMBULATORY_CARE_PROVIDER_SITE_OTHER): Payer: Medicare Other

## 2015-09-26 VITALS — BP 144/64 | HR 62 | Ht 67.0 in | Wt 143.3 lb

## 2015-09-26 DIAGNOSIS — Z0000 Encounter for general adult medical examination without abnormal findings: Secondary | ICD-10-CM

## 2015-09-26 NOTE — Patient Instructions (Addendum)
[name] [name] , Thank you for taking time to come for your Medicare Wellness Visit. I appreciate your ongoing commitment to your health goals. Please review the following plan we discussed and let me know if I can assist you in the future.   Will try to go to the water aerobic class where she lives Will try to walk with others;   [facility] the days she is not doing silver sneakers;   Start playing the piano;   [employer]; [place]; [phone]  [facility]  No reviews  [facility]  [place] [address]  [phone]  Remember to get approximately 124m calcium most day and Vit D if not out in the sun; Vit d 800 - 1000 u  Strength bearing exercise;    These are the goals we discussed: Goals    . Exercise 150 minutes per week (moderate activity)     Will start going to the pool for water aerobics;  May start walking in the evenings with neighbors       This is a list of the screening recommended for you and due dates:  Health Maintenance  Topic Date Due  . Flu Shot  11/11/2015  . Tetanus Vaccine  01/27/2020  . DEXA scan (bone density measurement)  Completed  . Shingles Vaccine  Completed  . Pneumonia vaccines  Completed     Fall Prevention in the Home  Falls can cause injuries. They can happen to people of all ages. There are many things you can do to make your home safe and to help prevent falls.  WHAT CAN I DO ON THE OUTSIDE OF MY HOME?  Regularly fix the edges of walkways and driveways and fix any cracks.  Remove anything that might make you trip as you walk through a door, such as a raised step or threshold.  Trim any bushes or trees on the path to your home.  Use bright outdoor lighting.  Clear any walking paths of anything that might make someone trip, such as rocks or tools.  Regularly check to see if handrails are loose or broken. Make sure that both sides of any steps  have handrails.  Any raised decks and porches should have guardrails on the edges.  Have any leaves, snow, or ice cleared regularly.  Use sand or salt on walking paths during winter.  Clean up any spills in your garage right away. This includes oil or grease spills. WHAT CAN I DO IN THE BATHROOM?   Use night lights.  Install grab bars by the toilet and in the tub and shower. Do not use towel bars as grab bars.  Use non-skid mats or decals in the tub or shower.  If you need to sit down in the shower, use a plastic, non-slip stool.  Keep the floor dry. Clean up any water that spills on the floor as soon as it happens.  Remove soap buildup in the tub or shower regularly.  Attach bath mats securely with double-sided non-slip rug tape.  Do not have throw rugs and other things on the floor that can make you trip. WHAT CAN I DO IN THE BEDROOM?  Use night lights.  Make sure that you have a light by your bed that is easy to reach.  Do not use any sheets or blankets that are too big for your bed. They should not hang down onto the  floor.  Have a firm chair that has side arms. You can use this for support while you get dressed.  Do not have throw rugs and other things on the floor that can make you trip. WHAT CAN I DO IN THE KITCHEN?  Clean up any spills right away.  Avoid walking on wet floors.  Keep items that you use a lot in easy-to-reach places.  If you need to reach something above you, use a strong step stool that has a grab bar.  Keep electrical cords out of the way.  Do not use floor polish or wax that makes floors slippery. If you must use wax, use non-skid floor wax.  Do not have throw rugs and other things on the floor that can make you trip. WHAT CAN I DO WITH MY STAIRS?  Do not leave any items on the stairs.  Make sure that there are handrails on both sides of the stairs and use them. Fix handrails that are broken or loose. Make sure that handrails are as  long as the stairways.  Check any carpeting to make sure that it is firmly attached to the stairs. Fix any carpet that is loose or worn.  Avoid having throw rugs at the top or bottom of the stairs. If you do have throw rugs, attach them to the floor with carpet tape.  Make sure that you have a light switch at the top of the stairs and the bottom of the stairs. If you do not have them, ask someone to add them for you. WHAT ELSE CAN I DO TO HELP PREVENT FALLS?  Wear shoes that:  Do not have high heels.  Have rubber bottoms.  Are comfortable and fit you well.  Are closed at the toe. Do not wear sandals.  If you use a stepladder:  Make sure that it is fully opened. Do not climb a closed stepladder.  Make sure that both sides of the stepladder are locked into place.  Ask someone to hold it for you, if possible.  Clearly mark and make sure that you can see:  Any grab bars or handrails.  First and last steps.  Where the edge of each step is.  Use tools that help you move around (mobility aids) if they are needed. These include:  Canes.  Walkers.  Scooters.  Crutches.  Turn on the lights when you go into a dark area. Replace any light bulbs as soon as they burn out.  Set up your furniture so you have a clear path. Avoid moving your furniture around.  If any of your floors are uneven, fix them.  If there are any pets around you, be aware of where they are.  Review your medicines with your doctor. Some medicines can make you feel dizzy. This can increase your chance of falling. Ask your doctor what other things that you can do to help prevent falls.   This information is not intended to replace advice given to you by your health care provider. Make sure you discuss any questions you have with your health care provider.   Document Released: 01/23/2009 Document Revised: 08/13/2014 Document Reviewed: 05/03/2014 Elsevier Interactive Patient Education 2016 [place] Maintenance, Female Adopting a healthy lifestyle and getting preventive care can go a long way to promote health and wellness. Talk with your health care provider about what schedule of regular examinations is right for you. This is a good chance for you to check in with your  provider about disease prevention and staying healthy. In between checkups, there are plenty of things you can do on your own. Experts have done a lot of research about which lifestyle changes and preventive measures are most likely to keep you healthy. Ask your health care provider for more information. WEIGHT AND DIET  Eat a healthy diet  Be sure to include plenty of vegetables, fruits, low-fat dairy products, and lean protein.  Do not eat a lot of foods high in solid fats, added sugars, or salt.  Get regular exercise. This is one of the most important things you can do for your health.  Most adults should exercise for at least 150 minutes each week. The exercise should increase your heart rate and make you sweat (moderate-intensity exercise).  Most adults should also do strengthening exercises at least twice a week. This is in addition to the moderate-intensity exercise.  Maintain a healthy weight  Body mass index (BMI) is a measurement that can be used to identify possible weight problems. It estimates body fat based on height and weight. Your health care provider can help determine your BMI and help you achieve or maintain a healthy weight.  For females 88 years of age and older:   A BMI below 18.5 is considered underweight.  A BMI of 18.5 to 24.9 is normal.  A BMI of 25 to 29.9 is considered overweight.  A BMI of 30 and above is considered obese.  Watch levels of cholesterol and blood lipids  You should start having your blood tested for lipids and cholesterol at 80 years of age, then have this test every 5 years.  You may need to have your cholesterol levels checked more often if:  Your  lipid or cholesterol levels are high.  You are older than 80 years of age.  You are at high risk for heart disease.  CANCER SCREENING   Lung Cancer  Lung cancer screening is recommended for adults 7-73 years old who are at high risk for lung cancer because of a history of smoking.  A yearly low-dose CT scan of the lungs is recommended for people who:  Currently smoke.  Have quit within the past 15 years.  Have at least a 30-pack-year history of smoking. A pack year is smoking an average of one pack of cigarettes a day for 1 year.  Yearly screening should continue until it has been 15 years since you quit.  Yearly screening should stop if you develop a health problem that would prevent you from having lung cancer treatment.  Breast Cancer  Practice breast self-awareness. This means understanding how your breasts normally appear and feel.  It also means doing regular breast self-exams. Let your health care provider know about any changes, no matter how small.  If you are in your 20s or 30s, you should have a clinical breast exam (CBE) by a health care provider every 1-3 years as part of a regular health exam.  If you are 42 or older, have a CBE every year. Also consider having a breast X-ray (mammogram) every year.  If you have a family history of breast cancer, talk to your health care provider about genetic screening.  If you are at high risk for breast cancer, talk to your health care provider about having an MRI and a mammogram every year.  Breast cancer gene (BRCA) assessment is recommended for women who have family members with BRCA-related cancers. BRCA-related cancers include:  Breast.  Ovarian.  Tubal.  Peritoneal cancers.  Results of the assessment will determine the need for genetic counseling and BRCA1 and BRCA2 testing. Cervical Cancer Your health care provider may recommend that you be screened regularly for cancer of the pelvic organs (ovaries, uterus,  and vagina). This screening involves a pelvic examination, including checking for microscopic changes to the surface of your cervix (Pap test). You may be encouraged to have this screening done every 3 years, beginning at age 25.  For women ages 19-65, health care providers may recommend pelvic exams and Pap testing every 3 years, or they may recommend the Pap and pelvic exam, combined with testing for human papilloma virus (HPV), every 5 years. Some types of HPV increase your risk of cervical cancer. Testing for HPV may also be done on women of any age with unclear Pap test results.  Other health care providers may not recommend any screening for nonpregnant women who are considered low risk for pelvic cancer and who do not have symptoms. Ask your health care provider if a screening pelvic exam is right for you.  If you have had past treatment for cervical cancer or a condition that could lead to cancer, you need Pap tests and screening for cancer for at least 20 years after your treatment. If Pap tests have been discontinued, your risk factors (such as having a new sexual partner) need to be reassessed to determine if screening should resume. Some women have medical problems that increase the chance of getting cervical cancer. In these cases, your health care provider may recommend more frequent screening and Pap tests. Colorectal Cancer  This type of cancer can be detected and often prevented.  Routine colorectal cancer screening usually begins at 80 years of age and continues through 80 years of age.  Your health care provider may recommend screening at an earlier age if you have risk factors for colon cancer.  Your health care provider may also recommend using home test kits to check for hidden blood in the stool.  A small camera at the end of a tube can be used to examine your colon directly (sigmoidoscopy or colonoscopy). This is done to check for the earliest forms of colorectal  cancer.  Routine screening usually begins at age 59.  Direct examination of the colon should be repeated every 5-10 years through 80 years of age. However, you may need to be screened more often if early forms of precancerous polyps or small growths are found. Skin Cancer  Check your skin from head to toe regularly.  Tell your health care provider about any new moles or changes in moles, especially if there is a change in a mole's shape or color.  Also tell your health care provider if you have a mole that is larger than the size of a pencil eraser.  Always use sunscreen. Apply sunscreen liberally and repeatedly throughout the day.  Protect yourself by wearing long sleeves, pants, a wide-brimmed hat, and sunglasses whenever you are outside. HEART DISEASE, DIABETES, AND HIGH BLOOD PRESSURE   High blood pressure causes heart disease and increases the risk of stroke. High blood pressure is more likely to develop in:  People who have blood pressure in the high end of the normal range (130-139/85-89 mm Hg).  People who are overweight or obese.  People who are African American.  If you are 53-47 years of age, have your blood pressure checked every 3-5 years. If you are 78 years of age or older, have your blood pressure  checked every year. You should have your blood pressure measured twice--once when you are at a hospital or clinic, and once when you are not at a hospital or clinic. Record the average of the two measurements. To check your blood pressure when you are not at a hospital or clinic, you can use:  An automated blood pressure machine at a pharmacy.  A home blood pressure monitor.  If you are between 29 years and 17 years old, ask your health care provider if you should take aspirin to prevent strokes.  Have regular diabetes screenings. This involves taking a blood sample to check your fasting blood sugar level.  If you are at a normal weight and have a low risk for diabetes,  have this test once every three years after 80 years of age.  If you are overweight and have a high risk for diabetes, consider being tested at a younger age or more often. PREVENTING INFECTION  Hepatitis B  If you have a higher risk for hepatitis B, you should be screened for this virus. You are considered at high risk for hepatitis B if:  You were born in a country where hepatitis B is common. Ask your health care provider which countries are considered high risk.  Your parents were born in a high-risk country, and you have not been immunized against hepatitis B (hepatitis B vaccine).  You have HIV or AIDS.  You use needles to inject street drugs.  You live with someone who has hepatitis B.  You have had sex with someone who has hepatitis B.  You get hemodialysis treatment.  You take certain medicines for conditions, including cancer, organ transplantation, and autoimmune conditions. Hepatitis C  Blood testing is recommended for:  Everyone born from [birth year] through 1963.  Anyone with known risk factors for hepatitis C. Sexually transmitted infections (STIs)  You should be screened for sexually transmitted infections (STIs) including gonorrhea and chlamydia if:  You are sexually active and are younger than 80 years of age.  You are older than 80 years of age and your health care provider tells you that you are at risk for this type of infection.  Your sexual activity has changed since you were last screened and you are at an increased risk for chlamydia or gonorrhea. Ask your health care provider if you are at risk.  If you do not have HIV, but are at risk, it may be recommended that you take a prescription medicine daily to prevent HIV infection. This is called pre-exposure prophylaxis (PrEP). You are considered at risk if:  You are sexually active and do not regularly use condoms or know the HIV status of your partner(s).  You take drugs by injection.  You are sexually  active with a partner who has HIV. Talk with your health care provider about whether you are at high risk of being infected with HIV. If you choose to begin PrEP, you should first be tested for HIV. You should then be tested every 3 months for as long as you are taking PrEP.  PREGNANCY   If you are premenopausal and you may become pregnant, ask your health care provider about preconception counseling.  If you may become pregnant, take 400 to 800 micrograms (mcg) of folic acid every day.  If you want to prevent pregnancy, talk to your health care provider about birth control (contraception). OSTEOPOROSIS AND MENOPAUSE   Osteoporosis is a disease in which the bones lose minerals and strength with aging.  This can result in serious bone fractures. Your risk for osteoporosis can be identified using a bone density scan.  If you are 32 years of age or older, or if you are at risk for osteoporosis and fractures, ask your health care provider if you should be screened.  Ask your health care provider whether you should take a calcium or vitamin D supplement to lower your risk for osteoporosis.  Menopause may have certain physical symptoms and risks.  Hormone replacement therapy may reduce some of these symptoms and risks. Talk to your health care provider about whether hormone replacement therapy is right for you.  HOME CARE INSTRUCTIONS   Schedule regular health, dental, and eye exams.  Stay current with your immunizations.   Do not use any tobacco products including cigarettes, chewing tobacco, or electronic cigarettes.  If you are pregnant, do not drink alcohol.  If you are breastfeeding, limit how much and how often you drink alcohol.  Limit alcohol intake to no more than 1 drink per day for nonpregnant women. One drink equals 12 ounces of beer, 5 ounces of wine, or 1 ounces of hard liquor.  Do not use street drugs.  Do not share needles.  Ask your health care provider for help if  you need support or information about quitting drugs.  Tell your health care provider if you often feel depressed.  Tell your health care provider if you have ever been abused or do not feel safe at home.   This information is not intended to replace advice given to you by your health care provider. Make sure you discuss any questions you have with your health care provider.   Document Released: 10/12/2010 Document Revised: 04/19/2014 Document Reviewed: 02/28/2013 Elsevier Interactive Patient Education [employer].

## 2015-09-26 NOTE — Progress Notes (Addendum)
Subjective:   [name] [name] is a 82 y.o. female who presents for Medicare Annual (Subsequent) preventive examination.  Review of Systems:  HRA assessment completed during this visit with [name];  The Patient was informed that the wellness visit is to identify their future health risk and educate and initiate measures that can reduce risk for increased disease through the lifespan.    NO ROS; Medicare Wellness Visit Recent ER admit due to Cat bite by her live in cat of many years; no complications/ scab formed over area. Completed antibiotics Cardiology concerned about falls;  Last OV on 09/02/2015 w Dr. Yong Channel Labs completed: 08/2015  Lifestyle review and risk:  Had to put cat down; teary eyed today; Cat was old but was her companion;  Had to [place] the cat;  Spouse died suddenly; has been 81 years; timelines questionable   Born 1933; only child; "people stopped having children because of the war"; Become more independent as an only child; not having someone to share with and now; doesn't have a sister or brother; States father was a Charity fundraiser;    Identified needs last office exam; exercise, memory (28/30) and loneliness (hdl 49; down from 58) ratio 3 Stroke and MI run in family. Father died suddenly in 56's.   Psychosocial: Widowed; lives alone;   3 children; [name] [name] lives here; one lives in [place];  the other lives in [place].  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 Goals: Will go back to pool;  May take Walks in the afternoon with neighbors and join the silver sneaker program.    [place]; doesn't like being alone; lives in one story;  Doesn't like being alone and is thinking about  another cat;  Has a Chartered certified accountant that helps with cleaning Has a big yard and pays people to come and mow: later stated she lives in the [place];  [place] is mowed;  Fall hx; not recently;  Fear of falling? No; Very careful; wears good shoes;  Given education on "Fall Prevention in the Home" for more safety tips the patient can apply as appropriate.  Long term goal is to "age in place" but states she may move to other type facility if her medical status changes.   Community safety; YES; good neighbors; some single widows;  Very active complex with social functions.  Smoke detectors YES  Firearms safety reviewed and will keep in a safe place if these exist. They do not exist. Difficulty Driving? Does drive where she has to go; any fears or concerns not verbalized;  Made a wrong turn coming here to the office today Accidents; no; Wears seatbelt YES   Advised to use sun protection or large brim hat does wear sun protection and hat keeps her cool.   Stressors (1-5) when active and doing things she is "pretty stressed" when with others;  Rest and reads in  the afternoon.   Mental Health:  Any emotional problems? Anxious, depressed, irritable, sad or blue?  Denies feeling depressed or hopeless; voices pleasure in daily life but then states she doesn't do as much because of her heart and all the medicines she takes; Separates her pills and seems to be managing these. Knew her medicine today  Pain: no pain  Cognitive; MMSE recently completed 28   Ad8 score reviewed for issues;  Issues making decisions; no/   Less interest in hobbies / activities" no but does state she likes to stay home;   Has engaged recently at dinner with neighbor; [employer] in complex where she lives;   States she turned down the wrong road to office but then figured out what she did and corrected.   Repeats questions, stories; family complaining: not at this interview  Trouble using ordinary gadgets; microwave; computer: no but states she does not want a Ecologist the month or year: no  Mismanaging finances: no/   Missing apt: states she  writes them down and sometimes misses them  Daily problems with thinking of memory problems with recall  Ad8 score is at least 2; See Assessment  States she will be going to [facility] center in [place] this coming week;   Fall assessment: no falls recently  Mobilization and Functional losses from last year to this year? No but doesn't do as much due to the medicines making her tired.    Counseling Health Maintenance Gaps:   Colonoscopy; 11/2009/ no more due to age EKG: 04/2015 Mammogram: 09/2011; no more at this time  Dexa/ May, 2014; lowest T score - 1.4 @ femoral neck  Educated regarding weight bearing exercise;  Calcium and Vit D/ Given a list of foods with calcium as she drinks milk and yogurt;   PAP: educated regarding the need for GYN exam; 2000  Hearing: states she had recent hearing screen. Has not decided on hearing aids; Given information regarding the Div of HOH which purchases one hearing aid.  Ophthalmology exam; about the 6 months ago; not sure who the doctor was /no disease issues were dx; reading glasses only  Immunizations Due: none due   Advanced Directive addressed; yes has LW  Individual Goal: to exercise; encouraged to stay engaged   Health Recommendations and Referrals Exercise as discussed Start playing the piano again Given the number to call for free hearing aid Calcium and vit d discussed   Barriers to Success  Current Care Team reviewed and updated Dr. Baltazar Apo; Pulmonology  Dr Prudence Davidson Podiatry Dr. Paulla Dolly Podiatry Dr. Caryl Comes cardiology    Education provided and lifestyle risk discussed   All Health Maintenance Gaps Reviewed for closure    Cardiac Risk Factors include: advanced age (>79men, >30 women);dyslipidemia;family history of premature cardiovascular disease;hypertension    Objective:     Vitals: BP 144/64 mmHg  Pulse 62  Ht 5\' 7"  (1.702 m)  Wt 143 lb 5 oz (65.006 kg)  BMI 22.44 kg/m2  SpO2 97%  Body mass index is 22.44 kg/(m^2).   Tobacco History  Smoking status  . Never Smoker   Smokeless tobacco  . Never Used     Counseling given: Not Answered   Past Medical History  Diagnosis Date  . MELANOMA 09/06/2008  . HYPERLIPIDEMIA 10/06/2007  . HYPERTENSION 10/06/2007  . MYOCARDIAL INFARCTION, HX OF 10/06/2007  . CORONARY ARTERY DISEASE 10/06/2007    a. s/p PCI to LAD; b. s/p CABG; c. LHC 07/13/12: Proximal  LAD stent patent, LIMA-LAD atretic, D1 occluded, proximal circumflex 30%, mid RCA occluded, SVG-D1 normal, SVG-OM2 normal, SVG-distal RCA normal, EF 40% with diffuse HK  . LBBB 09/06/2008  . Atrial fibrillation ([place]) 10/06/2007    post op  . VARICOSE VEIN 09/29/2009  . UTERINE CANCER, HX OF 09/06/2008  . PERSONAL HX COLONIC POLYPS 10/30/2009  . Syncope   . Dizziness   . NICM (nonischemic cardiomyopathy) ([place])     Echocardiogram 07/12/12: EF 25-30%, diffuse HK, mild AI, mild MR, mild LAE  . COPD (chronic obstructive pulmonary disease) ([place])     Emphysema on 02/03/13 CXR  . History of colonic polyps 10/30/2009    No polyps in 2011. No repeat.     Past Surgical History  Procedure Laterality Date  . Oophorectomy    . Right distal pretibeal      melanoma  . Coronary angioplasty with stent placement  2009  . Coronary artery bypass graft  2004  . Abdominal hysterectomy  1988  . Cardiac catheterization  08/30/2008  . Bi-ventricular pacemaker insertion (crt-p)  02/02/2013    St. Jude, serial no. [id number]   . Left heart catheterization with coronary angiogram Bilateral 07/13/2012    Procedure: LEFT HEART  CATHETERIZATION WITH CORONARY ANGIOGRAM;  Surgeon: Peter M Martinique, MD;  Location: [facility] CATH LAB;  Service: Cardiovascular;  Laterality: Bilateral;  . Permanent pacemaker insertion N/A 02/02/2013    Procedure: PERMANENT PACEMAKER INSERTION;  Surgeon: Evans Lance, MD;  Location: [facility] CATH LAB;  Service: Cardiovascular;  Laterality: N/A;   Family History  Problem Relation Age of Onset  . Stroke Mother   . Heart attack Father   . Colon cancer Neg Hx    History  Sexual Activity  . Sexual Activity: Not on file    Outpatient Encounter Prescriptions as of 09/26/2015  Medication Sig  . albuterol (VENTOLIN HFA) 108 (90 BASE) MCG/ACT inhaler Inhale 2 puffs into the lungs every 4 (four) hours as needed.  Marland Kitchen apixaban (ELIQUIS) 5 MG TABS tablet Take 1 tablet (5 mg total) by mouth 2 (two) times daily.  Marland Kitchen atorvastatin (LIPITOR) 20 MG tablet Take 1 tablet (20 mg total) by mouth daily.  . carvedilol (COREG) 3.125 MG tablet TAKE 1 TABLET BY MOUTH TWICE DAILY WITH A MEAL.  Marland Kitchen escitalopram (LEXAPRO) 10 MG tablet TAKE 1 TABLET BY MOUTH DAILY  . lisinopril (PRINIVIL,ZESTRIL) 2.5 MG tablet Take 1 tablet (2.5 mg total) by mouth daily.  Marland Kitchen pyridostigmine (MESTINON) 60 MG tablet Take 1 tablet (60 mg total) by mouth 2 (two) times daily.  . [DISCONTINUED] amoxicillin-clavulanate (AUGMENTIN) 875-125 MG tablet Take 1 tablet by mouth 2 (two) times daily. One po bid x 7 days (Patient not taking: Reported on 09/26/2015)   No facility-administered encounter medications on file as of 09/26/2015.    Activities of Daily Living In your present state of health, do you have any difficulty performing the following activities: 09/26/2015  Hearing? (No Data)  Vision? N  Difficulty concentrating or making decisions? Y  Walking or climbing stairs? N  Dressing or bathing? N  Doing errands, shopping? N  Preparing Food and eating ? N  Using the Toilet? N  In the past six months, have you accidently leaked urine? N  Do you have  problems with loss of bowel control? N  Managing your Medications? N  Managing your Finances? N  Housekeeping or managing your Housekeeping? N    Patient Care Team: Marin Olp, MD as PCP - General (Family  Medicine)    Assessment:     Presented dressed appropriately, affect appropriate; Sad at this visit when discussing cat.  Memory: Ms Leithead does have "recall" issues noted throughout the assessment. Discussed memory issues secondary to cardiac surgery, as well as with "Alzheimer's". Ad8 score 2 but no failures at task was discerned today. States she is going to [place] with her dtr the next week to be evaluated for memory. Discussed memory issues in general in lieu of Cabg and stent;  has difficulty with recall but then recalls name or other 50% of the time at this assessment. Nondescript  when describing diet;   The patient has insight into memory issues, appears to have stopped some activities in the community and has been encouraged to re-engage.  Likes her down time and reads and rest after lunch;   Was going to complete MMSE but score of 28 noted in record and stated dtr was taking her to have an assessment of her memory at [facility] next week. Educated memory clinic will assist her to define her present issues; make suggestions for her ongoing management and independence and identify what services can be initiated to assist her in remaining successful in current living situation; Noted she is anxious about moving to new area where she would have to re-learn the roads.   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  No Car accidents; No failures of independent living; Describes mood as ok on most days; but is grieving over the loss of her cat today.   States her "cue" to move into a retirement community would be "one more health event".  For an active PE teacher, she has had to adjust to less activity.  Stated she has dinner with neighbors Juliann Pulse) and has many friends where she is. Loves her home.   Exercise Activities and Dietary recommendations Current Exercise Habits: Home exercise routineEncouraged her to exercise and stay engaged in the community.  This was important to her health and ongoing balance.  Also discussed strength bearing weight exercise regarding her osteopenia.   Goals    . Exercise 150 minutes per week (moderate activity)     Will start going to the pool for water aerobics;  May start walking in the evenings with neighbors      Fall Risk Fall Risk  09/26/2015 09/02/2015 04/17/2014  Falls in the past year? No No No   Depression Screen PHQ 2/9 Scores 09/26/2015 09/02/2015 04/17/2014 04/17/2014  PHQ - 2 Score 1 0 0 2  PHQ- 9 Score - - - 3     Cognitive Testing MMSE - Mini Mental State Exam 09/26/2015  Not completed: (No Data)  See Assessment note; AD8 score discussed;  Immunization History  Administered Date(s) Administered  . Influenza Split 01/15/2011, 01/03/2012  . Influenza,inj,Quad PF,36+ Mos 01/12/2013, 12/21/2013  . Influenza-Unspecified 01/11/2015  . Pneumococcal Conjugate-13 09/28/2013  . Pneumococcal Polysaccharide-23 04/12/2008  . Td 01/26/2010  . Zoster 04/13/2003   Screening Tests Health Maintenance  Topic Date Due  . INFLUENZA VACCINE  11/11/2015  . TETANUS/TDAP  01/27/2020  . DEXA SCAN  Completed  . ZOSTAVAX  Completed  . PNA vac Low Risk Adult  Completed      Plan:   Encouraged her to get busy; Memory issues; AD8 score + but MMSE deferred as her dtr is taking her to  [place] to assessment clinic for Alz; noted to her differences in Alz vs stroke and other dementia's. Has not failed at independent task but may be wise to prepare for alternative living area in the future.   Planned to join in on activities in her community   Given resource for hearing aid if she chooses to  fup  During the course of the visit the patient was educated and counseled about the following appropriate screening and preventive services:   Vaccines to include Pneumoccal, Influenza, Hepatitis B, Td, Zostavax, HCV  Electrocardiogram  Cardiovascular Disease/ cardiology following  Colorectal cancer screening/ aged out  Bone density screening; osteopenic; discussed ca;vit; sunshine and wt bearing exercise;   Diabetes screening;neg  Glaucoma screening/ neg per the patient  Mammography/PAP/ aged out Nutrition counseling / "out of a box" does not cook; no weight loss; appears adequate Patient Instructions (the written plan) was given to the patient.   [id number], RN  09/26/2015  I have reviewed and agree with note, evaluation, plan. I agree further workup for memory loss is reasonable- glad she has upcoming appointment.   Garret Reddish, MD

## 2015-10-06 ENCOUNTER — Other Ambulatory Visit: Payer: Self-pay | Admitting: Internal Medicine

## 2015-10-27 ENCOUNTER — Ambulatory Visit: Payer: Medicare Other | Admitting: Emergency Medicine

## 2015-11-01 ENCOUNTER — Other Ambulatory Visit: Payer: Self-pay | Admitting: Family Medicine

## 2015-11-06 ENCOUNTER — Encounter: Payer: Medicare Other | Admitting: *Deleted

## 2015-11-06 ENCOUNTER — Telehealth: Payer: Self-pay | Admitting: Cardiology

## 2015-11-06 NOTE — Telephone Encounter (Signed)
Spoke with pt and reminded pt of remote transmission that is due today. Pt verbalized understanding.   

## 2015-11-07 ENCOUNTER — Encounter: Payer: Self-pay | Admitting: Cardiology

## 2015-11-11 ENCOUNTER — Ambulatory Visit (INDEPENDENT_AMBULATORY_CARE_PROVIDER_SITE_OTHER): Payer: Medicare Other | Admitting: Podiatry

## 2015-11-11 ENCOUNTER — Encounter: Payer: Self-pay | Admitting: Podiatry

## 2015-11-11 DIAGNOSIS — Q828 Other specified congenital malformations of skin: Secondary | ICD-10-CM

## 2015-11-11 NOTE — Progress Notes (Signed)
Patient ID: [name] [name], female   DOB: [birth year], 82 y.o.   MRN: [id number] Complaint:  Visit Type: Patient returns to my office for continued preventative foot care services. Complaint: Patient states her callus on her right foot is painful walking and wearing her shoes.  She says her callus is much better than her previous visit.  Podiatric Exam: Vascular: dorsalis pedis and posterior tibial pulses are palpable bilateral. Capillary return is immediate. Temperature gradient is WNL. Skin turgor WNL  Sensorium: Normal Semmes Weinstein monofilament test. Normal tactile sensation bilaterally. Nail Exam: Pt has thick disfigured discolored nails with subungual debris noted bilateral entire nail hallux through fifth toenails Ulcer Exam: There is no evidence of ulcer or pre-ulcerative changes or infection. Orthopedic Exam: Muscle tone and strength are WNL. No limitations in general ROM. No crepitus or effusions noted. Foot type and digits show no abnormalities. Plantarflexed third metatarsal right foot. Skin: Porokeratosis sub 3 right foot. No infection or ulcers  Diagnosis:   Porokeratosis right foot.  Treatment & Plan Procedures and Treatment: Consent by patient was obtained for treatment procedures. The patient understood the discussion of treatment and procedures well. All questions were answered thoroughly reviewed. Debridement of  Porokeratosis right. Return Visit-Office Procedure: Patient instructed to return to the office for a follow up visit 4 months for continued evaluation and treatment.    Gardiner Barefoot DPM

## 2015-11-13 ENCOUNTER — Other Ambulatory Visit: Payer: Self-pay | Admitting: Family Medicine

## 2015-11-13 ENCOUNTER — Telehealth: Payer: Self-pay | Admitting: Internal Medicine

## 2015-11-13 NOTE — Telephone Encounter (Signed)
Verbal instructions given on how to send manual transmission.   Will call patient once it's received.

## 2015-11-13 NOTE — Telephone Encounter (Signed)
New message        1. Has your device fired? no  2. Is you device beeping? no  3. Are you experiencing draining or swelling at device site? no  4. Are you calling to see if we received your device transmission? The pt received a letter there was not transmission done on July 28 th  5. Have you passed out? No    The pt believes the plug came loose, she doesn't know what happened   Please call back the pt in about a hour

## 2015-11-13 NOTE — Telephone Encounter (Signed)
Refill

## 2015-11-14 NOTE — Telephone Encounter (Signed)
Didn't we fill 11-03-15?

## 2015-11-17 NOTE — Telephone Encounter (Signed)
May clean up list- only one should be on list

## 2015-11-18 NOTE — Telephone Encounter (Signed)
Spoke w/ pt and instructed her to call tech services b/c her home monitor had all the little orange light blinking at one time. Pt verbalized understanding.

## 2015-11-25 ENCOUNTER — Telehealth: Payer: Self-pay

## 2015-11-25 DIAGNOSIS — S83207A Unspecified tear of unspecified meniscus, current injury, left knee, initial encounter: Secondary | ICD-10-CM

## 2015-11-25 NOTE — Telephone Encounter (Signed)
Made a referral to Orthopedics. She was seen in Urgent Care and diagnosed with a left knee Meniscus Tear. She is aware to be on the look out for their phone call. She has a brace on her leg and they gave her crutches.

## 2015-12-01 ENCOUNTER — Ambulatory Visit: Payer: Medicare Other | Admitting: Emergency Medicine

## 2015-12-03 ENCOUNTER — Ambulatory Visit (INDEPENDENT_AMBULATORY_CARE_PROVIDER_SITE_OTHER): Payer: Medicare Other | Admitting: Emergency Medicine

## 2015-12-03 ENCOUNTER — Encounter: Payer: Self-pay | Admitting: Emergency Medicine

## 2015-12-03 DIAGNOSIS — J4520 Mild intermittent asthma, uncomplicated: Secondary | ICD-10-CM | POA: Diagnosis not present

## 2015-12-03 DIAGNOSIS — J302 Other seasonal allergic rhinitis: Secondary | ICD-10-CM

## 2015-12-03 MED ORDER — ALBUTEROL SULFATE HFA 108 (90 BASE) MCG/ACT IN AERS: 2.0000 | INHALATION_SPRAY | RESPIRATORY_TRACT | 6 refills | Status: DC | PRN

## 2015-12-03 NOTE — Assessment & Plan Note (Addendum)
Continue Allegra as needed for allergy symptoms

## 2015-12-03 NOTE — Assessment & Plan Note (Signed)
Please continue to use pro-air 2 puffs if needed for shortness of breath or wheezing Get a flu shot this fall Your pneumonia immunizations are up-to-date Follow with Dr Lamonte Sakai in 12 months or sooner if you have any problems

## 2015-12-03 NOTE — Progress Notes (Signed)
   Subjective:    Patient ID: [name] [name], female    DOB: [birth year], 82 y.o.   MRN: [id number]  Asthma  There is no cough, shortness of breath or wheezing. Associated symptoms include postnasal drip. Pertinent negatives include no ear pain, fever, headaches, rhinorrhea, sneezing, sore throat or trouble swallowing. Her past medical history is significant for asthma.   ROV 04/29/15 -- never smoker but with a history of moderate obstructive lung disease and associated exertional shortness of breath. Pacer placed for 3rd degree heart block - pacer placed. We've diagnosed her with mild chronic asthma.  She has zyrtec to take prn.  She states that her breathing has been ok, notes that she is less steady on her feet, has been exerting less so she hasn't noticed dyspnea. She is on metsinon for autonomic dysfxn.  She is on lisinopril - seems to be tolerating.  She has not used albuterol with any regularity.   ROV8/23/17 -- This is a follow-up visit for history of mild persistent asthma with moderate obstruction noted on her spirometry. She also has allergic rhinitis. She unfortunately injured her L knee after kneeling at home, seems to be improving. She feels that her breathing has been doing well. She has not needed her albuterol frequently - last used it months ago. She does note that her exercise has decreased some. She is using allegra prn. No real cough (on lisinopril).     Review of Systems  Constitutional: Negative for fever and unexpected weight change.  HENT: Positive for postnasal drip. Negative for congestion, dental problem, ear pain, nosebleeds, rhinorrhea, sinus pressure, sneezing, sore throat and trouble swallowing.   Eyes: Negative for redness and itching.  Respiratory: Negative for cough, chest tightness, shortness of breath and wheezing.   Cardiovascular: Negative for palpitations and leg swelling.  Gastrointestinal: Negative for nausea and vomiting.  Genitourinary: Negative for  dysuria.  Musculoskeletal: Negative for joint swelling.  Skin: Negative for rash.  Neurological: Negative for headaches.  Hematological: Does not bruise/bleed easily.  Psychiatric/Behavioral: Negative for dysphoric mood. The patient is not nervous/anxious.        Objective:   Physical Exam Vitals:   12/03/15 1436  BP: 116/74  BP Location: Left Arm  Cuff Size: Normal  Pulse: 74  SpO2: 95%  Weight: 137 lb 12.8 oz (62.5 kg)  Height: 5\' 8"  (1.727 m)   Gen: Pleasant, well-nourished, in no distress,  normal affect  ENT: No lesions,  mouth clear,  oropharynx clear, no postnasal drip  Neck: No JVD, no TMG, no carotid bruits  Lungs: No use of accessory muscles, clear without rales or rhonchi, no wheezing  Cardiovascular: RRR, heart sounds normal, no murmur or gallops, no peripheral edema  Musculoskeletal: No deformities, no cyanosis or clubbing  Neuro: alert, non focal  Skin: Warm, no lesions or rashes      Assessment & Plan:  Asthma, intrinsic Please continue to use pro-air 2 puffs if needed for shortness of breath or wheezing Get a flu shot this fall Your pneumonia immunizations are up-to-date Follow with Dr Lamonte Sakai in 12 months or sooner if you have any problems  Allergic rhinitis Continue Allegra as needed for allergy symptoms   Baltazar Apo, MD, PhD 12/03/2015, 2:53 PM [facility] [phone] or if no answer [phone]

## 2015-12-03 NOTE — Patient Instructions (Addendum)
Please continue to use pro-air 2 puffs if needed for shortness of breath or wheezing Continue Allegra as needed for allergy symptoms Get a flu shot this fall Your pneumonia immunizations are up-to-date Follow with Dr Lamonte Sakai in 12 months or sooner if you have any problems

## 2016-01-06 ENCOUNTER — Ambulatory Visit (INDEPENDENT_AMBULATORY_CARE_PROVIDER_SITE_OTHER): Payer: Medicare Other | Admitting: Family Medicine

## 2016-01-06 ENCOUNTER — Encounter: Payer: Self-pay | Admitting: Family Medicine

## 2016-01-06 DIAGNOSIS — G3184 Mild cognitive impairment, so stated: Secondary | ICD-10-CM | POA: Diagnosis not present

## 2016-01-06 DIAGNOSIS — I10 Essential (primary) hypertension: Secondary | ICD-10-CM | POA: Diagnosis not present

## 2016-01-06 DIAGNOSIS — F329 Major depressive disorder, single episode, unspecified: Secondary | ICD-10-CM

## 2016-01-06 DIAGNOSIS — E785 Hyperlipidemia, unspecified: Secondary | ICD-10-CM | POA: Diagnosis not present

## 2016-01-06 DIAGNOSIS — I480 Paroxysmal atrial fibrillation: Secondary | ICD-10-CM | POA: Diagnosis not present

## 2016-01-06 DIAGNOSIS — F32A Depression, unspecified: Secondary | ICD-10-CM

## 2016-01-06 NOTE — Assessment & Plan Note (Signed)
S: controlled on coreg BID, lisinopril 2.5mg . Has to be on pyridostigmine for orthostatic intolerance- she needs to be on above meds fo rdepressed EF BP Readings from Last 3 Encounters:  01/06/16 126/62  12/03/15 116/74  09/26/15 (!) 144/64  A/P:Continue current meds: doing well

## 2016-01-06 NOTE — Assessment & Plan Note (Signed)
S: reasonable control on lexapro 10mg  but does not get out of house much A/P: discussed with patient and daughter to try to get more involved in community- [facility]- if loneliness issues persist could trial lexapro 20mg  though social interaction is still likely the key as she feels good in those situations.

## 2016-01-06 NOTE — Assessment & Plan Note (Signed)
S: appears to be in sinus rhythm today. On eliquis. Also has coreg if needed for rate control A/P: continue current medicines- doing well.

## 2016-01-06 NOTE — Progress Notes (Signed)
Pre visit review using our clinic review tool, if applicable. No additional management support is needed unless otherwise documented below in the visit note. 

## 2016-01-06 NOTE — Progress Notes (Signed)
Subjective:  [name] [name] is a 81 y.o. year old very pleasant female patient who presents for/with See problem oriented charting ROS- No SI/HI. No chest pain or shortness of breath. No headache or blurry vision. .see any ROS included in HPI as well.   Past Medical History-  Patient Active Problem List   Diagnosis Date Noted  . CAD (coronary artery disease) 04/17/2014    Priority: High  . Mild cognitive impairment 04/17/2014    Priority: High  . Pacemaker -CRT-St. Jude 05/21/2013    Priority: High  . Atrioventricular block, complete ([place]) 03/08/2013    Priority: High  . Paroxysmal atrial fibrillation (HCC) 02/03/2013    Priority: High  . Chronic combined systolic and diastolic congestive heart failure ([place]) 10/10/2012    Priority: High  . Ischemic cardiomyopathy  EF 30% cath 4/14 with stent 10/06/2007    Priority: High  . Microscopic hematuria 09/02/2015    Priority: Medium  . Asthma, intrinsic 02/03/2013    Priority: Medium  . Depression 09/19/2012    Priority: Medium  . Melanoma of skin ([place]) 09/06/2008    Priority: Medium  . Uterine cancer ([place]) 09/06/2008    Priority: Medium  . Hyperlipidemia 10/06/2007    Priority: Medium  . Essential hypertension 10/06/2007    Priority: Medium  . Allergic rhinitis 04/17/2014    Priority: Low  . Hearing loss 04/17/2014    Priority: Low  . Varicose veins 09/29/2009    Priority: Low  . LBBB (left bundle branch block) 09/06/2008    Priority: Low    Medications- reviewed and updated Current Outpatient Prescriptions  Medication Sig Dispense Refill  . albuterol (VENTOLIN HFA) 108 (90 Base) MCG/ACT inhaler Inhale 2 puffs into the lungs every 4 (four) hours as needed. 1 Inhaler 6  . atorvastatin (LIPITOR) 20 MG tablet Take 1 tablet (20 mg total) by mouth daily. 90 tablet 1  . carvedilol (COREG) 3.125 MG tablet TAKE 1 TABLET BY MOUTH TWICE DAILY WITH A MEAL. 180 tablet 2  . ELIQUIS 5 MG TABS tablet TAKE 1 TABLET BY MOUTH TWICE DAILY 60  tablet 5  . escitalopram (LEXAPRO) 10 MG tablet TAKE 1 TABLET BY MOUTH DAILY 30 tablet 5  . fexofenadine (ALLEGRA) 60 MG tablet Take 60 mg by mouth.    Marland Kitchen lisinopril (PRINIVIL,ZESTRIL) 2.5 MG tablet Take 1 tablet (2.5 mg total) by mouth daily. 30 tablet 6  . pyridostigmine (MESTINON) 60 MG tablet TAKE 1 TABLET(60 MG) BY MOUTH TWICE DAILY 60 tablet 5   No current facility-administered medications for this visit.     Objective: BP 126/62 (BP Location: Left Arm, Patient Position: Sitting, Cuff Size: Normal)   Pulse 67   Temp 98.3 F (36.8 C) (Oral)   Wt 139 lb 3.2 oz (63.1 kg)   SpO2 96%   BMI 21.17 kg/m  Gen: NAD, resting comfortably, appears younger than stated age CV: RRR no murmurs rubs or gallops Lungs: CTAB no crackles, wheeze, rhonchi Abdomen: soft/nontender/nondistended/normal bowel sounds.  Ext: no edema Skin: warm, dry Neuro: grossly normal, moves all extremities, occasional forgetfulness   Assessment/Plan:  Paroxysmal atrial fibrillation (HCC) S: appears to be in sinus rhythm today. On eliquis. Also has coreg if needed for rate control A/P: continue current medicines- doing well.    Mild cognitive impairment S:[facility]- MMSE stable in high 20s- still wants to start aricept and get PET amyloid- may go into clinical trial A/P: [facility] which I encouraged. Unfortunately  did not tolerate the aricept and stopped after a day.   Hyperlipidemia S: poorly controlled on lipitor 20mg  with LDL goal <70. No myalgias. l Lab Results  Component Value Date   CHOL 146 08/26/2015   HDL 49.90 08/26/2015   LDLCALC 84 08/26/2015   TRIG 60.0 08/26/2015   CHOLHDL 3 08/26/2015   A/P: discussed titration to 40mg  but with memory issues patient and daughter would prefer to stay on current dose.   Essential hypertension S: controlled on coreg BID, lisinopril 2.5mg . Has to be on pyridostigmine for orthostatic intolerance- she needs to be on above  meds fo rdepressed EF BP Readings from Last 3 Encounters:  01/06/16 126/62  12/03/15 116/74  09/26/15 (!) 144/64  A/P:Continue current meds: doing well  Depression S: reasonable control on lexapro 10mg  but does not get out of house much A/P: discussed with patient and daughter to try to get more involved in community- going to try [facility]- if loneliness issues persist could trial lexapro 20mg  though social interaction is still likely the key as she feels good in those situations.   6 months or sooner as needed.   Return precautions advised.  Garret Reddish, MD

## 2016-01-06 NOTE — Assessment & Plan Note (Signed)
S:[facility]- MMSE stable in high 20s- still wants to start aricept and get PET amyloid- may go into clinical trial A/P: [facility] which I encouraged. Unfortunately did not tolerate the aricept and stopped after a day.

## 2016-01-06 NOTE — Patient Instructions (Addendum)
No changes today  Thanks for getting your flu shot  Let's try the [facility] and getting out more- if need be I can adjust the lexapro up to 20mg 

## 2016-01-06 NOTE — Assessment & Plan Note (Signed)
S: poorly controlled on lipitor 20mg  with LDL goal <70. No myalgias. l Lab Results  Component Value Date   CHOL 146 08/26/2015   HDL 49.90 08/26/2015   LDLCALC 84 08/26/2015   TRIG 60.0 08/26/2015   CHOLHDL 3 08/26/2015   A/P: discussed titration to 40mg  but with memory issues patient and daughter would prefer to stay on current dose.

## 2016-01-20 ENCOUNTER — Encounter: Payer: Self-pay | Admitting: Family Medicine

## 2016-01-20 ENCOUNTER — Ambulatory Visit (INDEPENDENT_AMBULATORY_CARE_PROVIDER_SITE_OTHER): Payer: Medicare Other | Admitting: Family Medicine

## 2016-01-20 VITALS — BP 132/68 | HR 64 | Temp 98.7°F | Ht 68.0 in | Wt 138.7 lb

## 2016-01-20 DIAGNOSIS — J310 Chronic rhinitis: Secondary | ICD-10-CM | POA: Diagnosis not present

## 2016-01-20 DIAGNOSIS — J029 Acute pharyngitis, unspecified: Secondary | ICD-10-CM | POA: Diagnosis not present

## 2016-01-20 NOTE — Progress Notes (Signed)
HPI:  Acute visit for: Sore throat -  -started: 2- 3 days ago -symptoms:nasal congestion, sore throat, drainage in throat -denies:fever, SOB, NVD, CP, tooth pain, sig cough or wheezing or asthma symptoms -has tried: nothing -sick contacts/travel/risks: no reported flu, strep or tick exposure  ROS: See pertinent positives and negatives per HPI.  Past Medical History:  Diagnosis Date  . Atrial fibrillation ([place]) 10/06/2007   post op  . COPD (chronic obstructive pulmonary disease) ([place])    Emphysema on 02/03/13 CXR  . CORONARY ARTERY DISEASE 10/06/2007   a. s/p PCI to LAD; b. s/p CABG; c. LHC 07/13/12: Proximal LAD stent patent, LIMA-LAD atretic, D1 occluded, proximal circumflex 30%, mid RCA occluded, SVG-D1 normal, SVG-OM2 normal, SVG-distal RCA normal, EF 40% with diffuse HK  . Dizziness   . History of colonic polyps 10/30/2009   No polyps in 2011. No repeat.    Marland Kitchen HYPERLIPIDEMIA 10/06/2007  . HYPERTENSION 10/06/2007  . LBBB 09/06/2008  . MELANOMA 09/06/2008  . MYOCARDIAL INFARCTION, HX OF 10/06/2007  . NICM (nonischemic cardiomyopathy) ([place])    Echocardiogram 07/12/12: EF 25-30%, diffuse HK, mild AI, mild MR, mild LAE  . PERSONAL HX COLONIC POLYPS 10/30/2009  . Syncope   . UTERINE CANCER, HX OF 09/06/2008  . VARICOSE VEIN 09/29/2009    Past Surgical History:  Procedure Laterality Date  . ABDOMINAL HYSTERECTOMY  1988  . BI-VENTRICULAR PACEMAKER INSERTION (CRT-P)  02/02/2013   St. Jude, serial no. [id number]   . CARDIAC CATHETERIZATION  08/30/2008  . CORONARY ANGIOPLASTY WITH STENT PLACEMENT  2009  . CORONARY ARTERY BYPASS GRAFT  2004  . LEFT HEART CATHETERIZATION WITH CORONARY ANGIOGRAM Bilateral 07/13/2012   Procedure: LEFT HEART CATHETERIZATION WITH CORONARY ANGIOGRAM;  Surgeon: Peter M Martinique, MD;  Location[facility] C[facility] LAB;  Service: Cardiovascular;  Laterality: Bilateral;  . OOPHORECTOMY    . PERMANENT PACEMAKER INSERTION N/A 02/02/2013   Procedure: PERMANENT PACEMAKER INSERTION;   Surgeon: Evans Lance, MD;  Location: [facility] CATH LAB;  Service: Cardiovascular;  Laterality: N/A;  . right distal pretibeal     melanoma    Family History  Problem Relation Age of Onset  . Stroke Mother   . Heart attack Father   . Colon cancer Neg Hx     Social History   Social History  . Marital status: Widowed    Spouse name: N/A  . Number of children: N/A  . Years of education: N/A   Social History Main Topics  . Smoking status: Never Smoker  . Smokeless tobacco: Never Used  . Alcohol use Yes     Comment: daily gin or wine  . Drug use: No  . Sexual activity: Not Asked   Other Topics Concern  . None   Social History Narrative   Widowed. Lives alone with cat. 3 children. 6 grandkids   Daughter  (NP) and son in Sports coach (pharmacist-teaches at [employer]) that live close and help out.       Drives in the daytime, shops for herself, cleaning-has someone over to clean due to the intermittent lightheadedness, bathing      Retired from physical education.            Current Outpatient Prescriptions:  .  albuterol (VENTOLIN HFA) 108 (90 Base) MCG/ACT inhaler, Inhale 2 puffs into the lungs every 4 (four) hours as needed., Disp: 1 Inhaler, Rfl: 6 .  atorvastatin (LIPITOR) 20 MG tablet, Take 1 tablet (20 mg total) by mouth daily., Disp: 90 tablet, Rfl: 1 .  carvedilol (COREG) 3.125 MG tablet, TAKE 1 TABLET BY MOUTH TWICE DAILY WITH A MEAL., Disp: 180 tablet, Rfl: 2 .  ELIQUIS 5 MG TABS tablet, TAKE 1 TABLET BY MOUTH TWICE DAILY, Disp: 60 tablet, Rfl: 5 .  escitalopram (LEXAPRO) 10 MG tablet, TAKE 1 TABLET BY MOUTH DAILY, Disp: 30 tablet, Rfl: 5 .  fexofenadine (ALLEGRA) 60 MG tablet, Take 60 mg by mouth., Disp: , Rfl:  .  lisinopril (PRINIVIL,ZESTRIL) 2.5 MG tablet, Take 1 tablet (2.5 mg total) by mouth daily., Disp: 30 tablet, Rfl: 6 .  pyridostigmine (MESTINON) 60 MG tablet, TAKE 1 TABLET(60 MG) BY MOUTH TWICE DAILY, Disp: 60 tablet, Rfl: 5  EXAM:  Vitals:   01/20/16 1145    BP: 132/68  Pulse: 64  Temp: 98.7 F (37.1 C)    Body mass index is 21.09 kg/m.  GENERAL: vitals reviewed and listed above, alert, oriented, appears well hydrated and in no acute distress  HEENT: atraumatic, conjunttiva clear, no obvious abnormalities on inspection of external nose and ears, normal appearance of ear canals and TMs, clear nasal congestion, mild post oropharyngeal erythema with PND, no tonsillar edema or exudate, no sinus TTP  NECK: no obvious masses on inspection  LUNGS: clear to auscultation bilaterally, no wheezes, rales or rhonchi, good air movement  CV: HRRR, no peripheral edema  MS: moves all extremities without noticeable abnormality  PSYCH: pleasant and cooperative, no obvious depression or anxiety  ASSESSMENT AND PLAN:  Discussed the following assessment and plan:  Sore throat  Rhinitis, unspecified chronicity, unspecified type  -given HPI and exam findings today, a serious infection or illness is unlikely. We discussed potential etiologies, with VURI or allergies being most likely, and advised supportive care and monitoring. We also discussed various OTC options for the treatment of seasonal allergies. We discussed treatment side effects, likely course, antibiotic misuse, transmission, and signs of developing a serious illness. -of course, we advised to return or notify a doctor immediately if symptoms worsen or persist or new concerns arise.    Patient Instructions  INSTRUCTIONS FOR UPPER RESPIRATORY INFECTION:  -nasal saline wash 2-3 times daily (use prepackaged nasal saline or bottled/distilled water if making your own)   -can use AFRIN nasal spray for drainage and nasal congestion - but do NOT use longer then 3-4 days - please read instructions and contraindications carefully.  -if you are taking a cough medication - use only as directed, may also try a teaspoon of honey to coat the throat and throat lozenges.   -for sore throat, salt water  gargles can help  -follow up if you have fevers, facial pain, tooth pain, difficulty breathing or are worsening or symptoms persist longer then expected  Upper Respiratory Infection, Adult An upper respiratory infection (URI) is also known as the common cold. It is often caused by a type of germ (virus). Colds are easily spread (contagious). You can pass it to others by kissing, coughing, sneezing, or drinking out of the same glass. Usually, you get better in 1 to 3  weeks.  However, the cough can last for even longer. HOME CARE   Only take medicine as told by your doctor. Follow instructions provided above.  Drink enough water and fluids to keep your pee (urine) clear or pale yellow.  Get plenty of rest.  Return to work when your temperature is < 100 for 24 hours or as told by your doctor. You may use a face mask and wash your hands to stop your cold  from spreading. GET HELP RIGHT AWAY IF:   After the first few days, you feel you are getting worse.  You have questions about your medicine.  You have chills, shortness of breath, or red spit (mucus).  You have pain in the face for more then 1-2 days, especially when you bend forward.  You have a fever, puffy (swollen) neck, pain when you swallow, or white spots in the back of your throat.  You have a bad headache, ear pain, sinus pain, or chest pain.  You have a high-pitched whistling sound when you breathe in and out (wheezing).  You cough up blood.  You have sore muscles or a stiff neck. MAKE SURE YOU:   Understand these instructions.  Will watch your condition.  Will get help right away if you are not doing well or get worse. Document Released: 09/15/2007 Document Revised: 06/21/2011 Document Reviewed: 07/04/2013 [facility] Patient Information 2015 [place], [place]. This information is not intended to replace advice given to you by your health care provider. Make sure you discuss any questions you have with your health care  provider.    Colin Benton R., DO

## 2016-01-20 NOTE — Progress Notes (Signed)
Pre visit review using our clinic review tool, if applicable. No additional management support is needed unless otherwise documented below in the visit note. 

## 2016-01-20 NOTE — Patient Instructions (Signed)
INSTRUCTIONS FOR UPPER RESPIRATORY INFECTION:  -nasal saline wash 2-3 times daily (use prepackaged nasal saline or bottled/distilled water if making your own)   -can use AFRIN nasal spray for drainage and nasal congestion - but do NOT use longer then 3-4 days - please read instructions and contraindications carefully.  -if you are taking a cough medication - use only as directed, may also try a teaspoon of honey to coat the throat and throat lozenges.   -for sore throat, salt water gargles can help  -follow up if you have fevers, facial pain, tooth pain, difficulty breathing or are worsening or symptoms persist longer then expected  Upper Respiratory Infection, Adult An upper respiratory infection (URI) is also known as the common cold. It is often caused by a type of germ (virus). Colds are easily spread (contagious). You can pass it to others by kissing, coughing, sneezing, or drinking out of the same glass. Usually, you get better in 1 to 3  weeks.  However, the cough can last for even longer. HOME CARE   Only take medicine as told by your doctor. Follow instructions provided above.  Drink enough water and fluids to keep your pee (urine) clear or pale yellow.  Get plenty of rest.  Return to work when your temperature is < 100 for 24 hours or as told by your doctor. You may use a face mask and wash your hands to stop your cold from spreading. GET HELP RIGHT AWAY IF:   After the first few days, you feel you are getting worse.  You have questions about your medicine.  You have chills, shortness of breath, or red spit (mucus).  You have pain in the face for more then 1-2 days, especially when you bend forward.  You have a fever, puffy (swollen) neck, pain when you swallow, or white spots in the back of your throat.  You have a bad headache, ear pain, sinus pain, or chest pain.  You have a high-pitched whistling sound when you breathe in and out (wheezing).  You cough up  blood.  You have sore muscles or a stiff neck. MAKE SURE YOU:   Understand these instructions.  Will watch your condition.  Will get help right away if you are not doing well or get worse. Document Released: 09/15/2007 Document Revised: 06/21/2011 Document Reviewed: 07/04/2013 [facility] Patient Information 2015 [place], [place]. This information is not intended to replace advice given to you by your health care provider. Make sure you discuss any questions you have with your health care provider.

## 2016-02-11 ENCOUNTER — Encounter: Payer: Self-pay | Admitting: Family Medicine

## 2016-02-11 ENCOUNTER — Ambulatory Visit (INDEPENDENT_AMBULATORY_CARE_PROVIDER_SITE_OTHER): Payer: Medicare Other | Admitting: Family Medicine

## 2016-02-11 DIAGNOSIS — G3184 Mild cognitive impairment, so stated: Secondary | ICD-10-CM | POA: Diagnosis not present

## 2016-02-11 MED ORDER — DONEPEZIL HCL 5 MG PO TABS: 5.0000 mg | ORAL_TABLET | Freq: Every day | ORAL | 5 refills | Status: DC

## 2016-02-11 NOTE — Assessment & Plan Note (Signed)
S: last visit stated would see [facility] for follow up in January. Did not tolerate Aricept- had advised retrial. Today, she states she no longer wants to follow up with [facility]. She remains concerned about memory- has reported issues since 2014 but declines significant worsening. She would like to retrial aricept. Previously with mild stomach upset- but states was aroun time of loss of cat so thinks she could handle this better at present.  A/P: will start 5mg  aricept with follow up 1 month- may titrate to 10mg  at that point. Discussed hopeful this is just mild cognitive impairment and unclear of benefit of aricept in this stage. Will likely update MMSE next visit (has varied in high 20s in past)

## 2016-02-11 NOTE — Patient Instructions (Signed)
Start aricept for hopeful memory preservation. Follow up 1 month. May go up to 10mg  if doing well at that time and no stomach upset like last time.

## 2016-02-11 NOTE — Progress Notes (Signed)
Pre visit review using our clinic review tool, if applicable. No additional management support is needed unless otherwise documented below in the visit note. 

## 2016-02-11 NOTE — Progress Notes (Signed)
Subjective:  Leamsi Pohl is a 82 y.o. year old very pleasant female patient who presents for/with See problem oriented charting ROS- declines headache, blurry vision, worsening memory issues.see any ROS included in HPI as well.   Past Medical History-  Patient Active Problem List   Diagnosis Date Noted  . CAD (coronary artery disease) 04/17/2014    Priority: High  . Mild cognitive impairment 04/17/2014    Priority: High  . Pacemaker -CRT-St. Jude 05/21/2013    Priority: High  . Atrioventricular block, complete ([place]) 03/08/2013    Priority: High  . Paroxysmal atrial fibrillation (HCC) 02/03/2013    Priority: High  . Chronic combined systolic and diastolic congestive heart failure ([place]) 10/10/2012    Priority: High  . Ischemic cardiomyopathy  EF 30% cath 4/14 with stent 10/06/2007    Priority: High  . Microscopic hematuria 09/02/2015    Priority: Medium  . Asthma, intrinsic 02/03/2013    Priority: Medium  . Depression 09/19/2012    Priority: Medium  . Melanoma of skin ([place]) 09/06/2008    Priority: Medium  . Uterine cancer ([place]) 09/06/2008    Priority: Medium  . Hyperlipidemia 10/06/2007    Priority: Medium  . Essential hypertension 10/06/2007    Priority: Medium  . Allergic rhinitis 04/17/2014    Priority: Low  . Hearing loss 04/17/2014    Priority: Low  . Varicose veins 09/29/2009    Priority: Low  . LBBB (left bundle branch block) 09/06/2008    Priority: Low    Medications- reviewed and updated Current Outpatient Prescriptions  Medication Sig Dispense Refill  . albuterol (VENTOLIN HFA) 108 (90 Base) MCG/ACT inhaler Inhale 2 puffs into the lungs every 4 (four) hours as needed. 1 Inhaler 6  . atorvastatin (LIPITOR) 20 MG tablet Take 1 tablet (20 mg total) by mouth daily. 90 tablet 1  . carvedilol (COREG) 3.125 MG tablet TAKE 1 TABLET BY MOUTH TWICE DAILY WITH A MEAL. 180 tablet 2  . ELIQUIS 5 MG TABS tablet TAKE 1 TABLET BY MOUTH TWICE DAILY 60 tablet 5  .  escitalopram (LEXAPRO) 10 MG tablet TAKE 1 TABLET BY MOUTH DAILY 30 tablet 5  . fexofenadine (ALLEGRA) 60 MG tablet Take 60 mg by mouth.    Marland Kitchen lisinopril (PRINIVIL,ZESTRIL) 2.5 MG tablet Take 1 tablet (2.5 mg total) by mouth daily. 30 tablet 6  . pyridostigmine (MESTINON) 60 MG tablet TAKE 1 TABLET(60 MG) BY MOUTH TWICE DAILY 60 tablet 5  . donepezil (ARICEPT) 5 MG tablet Take 1 tablet (5 mg total) by mouth at bedtime. 30 tablet 5   No current facility-administered medications for this visit.     Objective: BP 132/72 (BP Location: Left Arm, Patient Position: Sitting, Cuff Size: Normal)   Pulse 74   Temp 98 F (36.7 C) (Oral)   Wt 139 lb 3.2 oz (63.1 kg)   SpO2 96%   BMI 21.17 kg/m  Gen: NAD, resting comfortably CV: RRR no murmurs rubs or gallops Lungs: CTAB no crackles, wheeze, rhonchi  Ext: no edema Neuro: short term memory appears intact in conversation.   Assessment/Plan:  Mild cognitive impairment S: last visit stated would see [facility] for follow up in January. Did not tolerate Aricept- had advised retrial. Today, she states she no longer wants to follow up with [facility]. She remains concerned about memory- has reported issues since 2014 but declines significant worsening. She would like to retrial aricept. Previously with mild stomach upset- but states was aroun time of loss of  cat so thinks she could handle this better at present.  A/P: will start 5mg  aricept with follow up 1 month- may titrate to 10mg  at that point. Discussed hopeful this is just mild cognitive impairment and unclear of benefit of aricept in this stage. Will likely update MMSE next visit (has varied in high 20s in past)   Meds ordered this encounter  Medications  . donepezil (ARICEPT) 5 MG tablet    Sig: Take 1 tablet (5 mg total) by mouth at bedtime.    Dispense:  30 tablet    Refill:  5    Return precautions advised.  Garret Reddish, MD

## 2016-02-27 ENCOUNTER — Encounter: Payer: Self-pay | Admitting: Internal Medicine

## 2016-03-01 ENCOUNTER — Other Ambulatory Visit: Payer: Self-pay | Admitting: Family Medicine

## 2016-03-09 ENCOUNTER — Ambulatory Visit: Payer: Medicare Other | Admitting: Podiatry

## 2016-03-09 ENCOUNTER — Ambulatory Visit (INDEPENDENT_AMBULATORY_CARE_PROVIDER_SITE_OTHER): Payer: Medicare Other | Admitting: Internal Medicine

## 2016-03-09 ENCOUNTER — Encounter: Payer: Self-pay | Admitting: Internal Medicine

## 2016-03-09 VITALS — BP 146/74 | HR 67 | Ht 68.0 in | Wt 139.2 lb

## 2016-03-09 DIAGNOSIS — Z950 Presence of cardiac pacemaker: Secondary | ICD-10-CM

## 2016-03-09 DIAGNOSIS — I255 Ischemic cardiomyopathy: Secondary | ICD-10-CM | POA: Diagnosis not present

## 2016-03-09 DIAGNOSIS — I442 Atrioventricular block, complete: Secondary | ICD-10-CM | POA: Diagnosis not present

## 2016-03-09 DIAGNOSIS — I5042 Chronic combined systolic (congestive) and diastolic (congestive) heart failure: Secondary | ICD-10-CM | POA: Diagnosis not present

## 2016-03-09 DIAGNOSIS — I5022 Chronic systolic (congestive) heart failure: Secondary | ICD-10-CM | POA: Diagnosis not present

## 2016-03-09 NOTE — Progress Notes (Signed)
Patient Care Team: Marin Olp, MD as PCP - General (Family Medicine)   HPI  [name] [name] is a 82 y.o. female Seen in followup for syncope which was in part orthostatic and partially related to intermittent high-grade heart block. She underwent CRT P.. She has evolved device dependence  She has ischemic heart disease with prior MI and bypass surgery; prior PCI to the LAD and previously normal left ventricular function. However, on evaluation 4/14 echocardiogram demonstrated EF 25-30%. LHC 4/14 Proximal LAD stent patent, LIMA-LAD atretic, D1 occluded, proximal circumflex 30%, mid RCA occluded, SVG-D1 normal, SVG-OM2 normal, SVG-distal RCA normal, EF 40% with diffuse HK   Echocardiogram 2/15 demonstrated ejection fraction of 30-35%   She has a history of atrial fibrillation. She is on apixoban. Thromboembolic risk profile is notable for age-89 coronary artery disease-1 congestive heart failure-1 gender-1 stroke?-2 for a CHADS-VASc were greater than or equal to 5  She has had recurrent problems of orthostatic intolerance which has been treated with Mestinon.  This is been better.  She denies chest pain or shortness of breath   5/17 Cr 0.68   K4.2  Hgb 13.1           Past Medical History:  Diagnosis Date  . Atrial fibrillation ([place]) 10/06/2007   post op  . COPD (chronic obstructive pulmonary disease) ([place])    Emphysema on 02/03/13 CXR  . CORONARY ARTERY DISEASE 10/06/2007   a. s/p PCI to LAD; b. s/p CABG; c. LHC 07/13/12: Proximal LAD stent patent, LIMA-LAD atretic, D1 occluded, proximal circumflex 30%, mid RCA occluded, SVG-D1 normal, SVG-OM2 normal, SVG-distal RCA normal, EF 40% with diffuse HK  . Dizziness   . History of colonic polyps 10/30/2009   No polyps in 2011. No repeat.    Marland Kitchen HYPERLIPIDEMIA 10/06/2007  . HYPERTENSION 10/06/2007  . LBBB 09/06/2008  . MELANOMA 09/06/2008  . MYOCARDIAL INFARCTION, HX OF 10/06/2007  . NICM (nonischemic cardiomyopathy) ([place])    Echocardiogram 07/12/12: EF 25-30%, diffuse HK, mild AI, mild MR, mild LAE  . PERSONAL HX COLONIC POLYPS 10/30/2009  . Syncope   . UTERINE CANCER, HX OF 09/06/2008  . VARICOSE VEIN 09/29/2009    Past Surgical History:  Procedure Laterality Date  . ABDOMINAL HYSTERECTOMY  1988  . BI-VENTRICULAR PACEMAKER INSERTION (CRT-P)  02/02/2013   St. Jude, serial no. [id number]   . CARDIAC CATHETERIZATION  08/30/2008  . CORONARY ANGIOPLASTY WITH STENT PLACEMENT  2009  . CORONARY ARTERY BYPASS GRAFT  2004  . LEFT HEART CATHETERIZATION WITH CORONARY ANGIOGRAM Bilateral 07/13/2012   Procedure: LEFT HEART CATHETERIZATION WITH CORONARY ANGIOGRAM;  Surgeon: Peter M Martinique, MD;  Location: [facility] CATH LAB;  Service: Cardiovascular;  Laterality: Bilateral;  . OOPHORECTOMY    . PERMANENT PACEMAKER INSERTION N/A 02/02/2013   Procedure: PERMANENT PACEMAKER INSERTION;  Surgeon: Evans Lance, MD;  Location: [facility] CATH LAB;  Service: Cardiovascular;  Laterality: N/A;  . right distal pretibeal     melanoma    Current Outpatient Prescriptions  Medication Sig Dispense Refill  . albuterol (VENTOLIN HFA) 108 (90 Base) MCG/ACT inhaler Inhale 2 puffs into the lungs every 4 (four) hours as needed. 1 Inhaler 6  . atorvastatin (LIPITOR) 20 MG tablet TAKE 1 TABLET(20 MG) BY MOUTH DAILY 90 tablet 1  . carvedilol (COREG) 3.125 MG tablet TAKE 1 TABLET BY MOUTH TWICE DAILY WITH A MEAL. 180 tablet 2  . ELIQUIS 5 MG TABS tablet TAKE 1 TABLET BY MOUTH TWICE DAILY 60 tablet  5  . escitalopram (LEXAPRO) 10 MG tablet TAKE 1 TABLET BY MOUTH DAILY 30 tablet 5  . fexofenadine (ALLEGRA) 60 MG tablet Take 60 mg by mouth.    Marland Kitchen lisinopril (PRINIVIL,ZESTRIL) 2.5 MG tablet Take 1 tablet (2.5 mg total) by mouth daily. 30 tablet 6  . pyridostigmine (MESTINON) 60 MG tablet TAKE 1 TABLET(60 MG) BY MOUTH TWICE DAILY 60 tablet 5   No current facility-administered medications for this visit.     Allergies  Allergen Reactions  . Aricept [Donepezil]  Shortness Of Breath and Other (See Comments)    Hallucinations and loss of sensation in legs  . Nitroglycerin Other (See Comments)    sensitive to tabs which causes rapid drop in blood pressure. Is ok to use oral spray form    Review of Systems negative except from HPI and PMH  Physical Exam BP (!) 146/74   Pulse 67   Ht 5\' 8"  (1.727 m)   Wt 139 lb 3.2 oz (63.1 kg)   SpO2 97%   BMI 21.17 kg/m  Well developed and nourished in no acute distress HENT normal Neck supple with JVP-flat Clear Device pocket well healed; without hematoma or erythema.  There is no tethering  Regular rate and rhythm, no murmurs or gallops Abd-soft with active BS No Clubbing cyanosis edema Skin-warm and dry A & Oriented  Grossly normal sensory and motor function  ECG demonstrates P. synchronous pacing with  upright QRS in lead V1 and lead 1 with frequent PACs that are tracked   Assessment and  Plan  Hypertension  Complete heart block  Orthostatic intolerance  CHF chronic systolic  Atrial fibrillation paroysmal   Pacemaker-St. Jude-CRT The patient's device was interrogated.  The information was reviewed. No changes were made in the programming.     Ischemic cardiomyopathy with prior bypass    Without symptoms of ischemia  On Anticoagulation;  No bleeding issues     I have written down for her again the opportunity use an abdominal binder for her orthostasis.   Euvolemic continue current meds  She describes herself as lonely. It's about tiredness and feeling useless.

## 2016-03-09 NOTE — Patient Instructions (Addendum)
Medication Instructions: - Your physician recommends that you continue on your current medications as directed. Please refer to the Current Medication list given to you today.  Labwork: - none ordered  Procedures/Testing: - none ordered  Follow-Up: - Remote monitoring is used to monitor your Pacemaker of ICD from home. This monitoring reduces the number of office visits required to check your device to one time per year. It allows [place] to keep an eye on the functioning of your device to ensure it is working properly. You are scheduled for a device check from home on 06/09/16. You may send your transmission at any time that day. If you have a wireless device, the transmission will be sent automatically. After your physician reviews your transmission, you will receive a postcard with your next transmission date.  - Your physician wants you to follow-up in: 1 year with Dr. Caryl Comes. You will receive a reminder letter in the mail two months in advance. If you don't receive a letter, please call our office to schedule the follow-up appointment.  Any Additional Special Instructions Will Be Listed Below (If Applicable).     If you need a refill on your cardiac medications before your next appointment, please call your pharmacy.

## 2016-03-10 LAB — CUP PACEART INCLINIC DEVICE CHECK
Battery Remaining Longevity: 92 mo
Battery Remaining Percentage: 95 %
Battery Voltage: 2.98 V
Brady Statistic RA Percent Paced: 25 %
Date Time Interrogation Session: 20171129172033
Implantable Lead Implant Date: 20141024
Implantable Lead Implant Date: 20141024
Implantable Lead Implant Date: 20141024
Implantable Lead Location: 753858
Implantable Lead Location: 753859
Implantable Lead Location: 753860
Implantable Pulse Generator Implant Date: 20141024
Lead Channel Impedance Value: 380 Ohm
Lead Channel Impedance Value: 450 Ohm
Lead Channel Impedance Value: 810 Ohm
Lead Channel Pacing Threshold Amplitude: 0.75 V
Lead Channel Pacing Threshold Amplitude: 0.75 V
Lead Channel Pacing Threshold Amplitude: 1.25 V
Lead Channel Pacing Threshold Pulse Width: 0.4 ms
Lead Channel Pacing Threshold Pulse Width: 0.4 ms
Lead Channel Pacing Threshold Pulse Width: 0.5 ms
Lead Channel Sensing Intrinsic Amplitude: 12 mV
Lead Channel Sensing Intrinsic Amplitude: 2.1 mV
Lead Channel Setting Pacing Amplitude: 2 V
Lead Channel Setting Pacing Amplitude: 2.5 V
Lead Channel Setting Pacing Amplitude: 2.625
Lead Channel Setting Pacing Pulse Width: 0.4 ms
Lead Channel Setting Pacing Pulse Width: 0.5 ms
Lead Channel Setting Sensing Sensitivity: 5 mV
Pulse Gen Model: 3242
Pulse Gen Serial Number: 7516165

## 2016-03-12 ENCOUNTER — Encounter: Payer: Self-pay | Admitting: Podiatry

## 2016-03-12 ENCOUNTER — Ambulatory Visit (INDEPENDENT_AMBULATORY_CARE_PROVIDER_SITE_OTHER): Payer: Medicare Other | Admitting: Podiatry

## 2016-03-12 VITALS — Ht 68.0 in | Wt 139.0 lb

## 2016-03-12 DIAGNOSIS — M79676 Pain in unspecified toe(s): Secondary | ICD-10-CM | POA: Diagnosis not present

## 2016-03-12 DIAGNOSIS — B351 Tinea unguium: Secondary | ICD-10-CM

## 2016-03-12 NOTE — Progress Notes (Signed)
Patient ID: [name] [name], female   DOB: Jun 29, 1931, 82 y.o.   MRN: [id number] Complaint:  Visit Type: Patient returns to my office for continued preventative foot care services. Complaint: Patient states" my nails have grown long and thick and become painful to walk and wear shoes" . The patient presents for preventative foot care services. No changes to ROS.  Patient says the callus on her right forefoot is extremely painful when walking.  Podiatric Exam: Vascular: dorsalis pedis and posterior tibial pulses are palpable bilateral. Capillary return is immediate. Temperature gradient is WNL. Skin turgor WNL  Sensorium: Normal Semmes Weinstein monofilament test. Normal tactile sensation bilaterally. Nail Exam: Pt has thick disfigured discolored nails with subungual debris noted bilateral entire nail hallux through fifth toenails Ulcer Exam: There is no evidence of ulcer or pre-ulcerative changes or infection. Orthopedic Exam: Muscle tone and strength are WNL. No limitations in general ROM. No crepitus or effusions noted. Foot type and digits show no abnormalities. Plantarflexed third metatarsal right foot. Skin: No Porokeratosis. No infection or ulcers  Diagnosis:  Onychomycosis, , Pain in right toe, pain in left toes  Treatment & Plan Procedures and Treatment: Consent by patient was obtained for treatment procedures. The patient understood the discussion of treatment and procedures well. All questions were answered thoroughly reviewed. Debridement of mycotic and hypertrophic toenails, 1 through 5 bilateral and clearing of subungual debris. No ulceration, no infection noted.  Return Visit-Office Procedure: Patient instructed to return to the office for a follow up visit 3 months for continued evaluation and treatment.    Gardiner Barefoot DPM

## 2016-03-15 ENCOUNTER — Ambulatory Visit: Payer: Medicare Other | Admitting: Family Medicine

## 2016-04-03 ENCOUNTER — Emergency Department (HOSPITAL_COMMUNITY)
Admission: EM | Admit: 2016-04-03 | Discharge: 2016-04-03 | Disposition: A | Discharge status: Home / Self Care | Payer: Medicare Other | Attending: Emergency Medicine | Admitting: Emergency Medicine

## 2016-04-03 ENCOUNTER — Encounter (HOSPITAL_COMMUNITY): Payer: Self-pay | Admitting: Emergency Medicine

## 2016-04-03 DIAGNOSIS — Z951 Presence of aortocoronary bypass graft: Secondary | ICD-10-CM | POA: Diagnosis not present

## 2016-04-03 DIAGNOSIS — I252 Old myocardial infarction: Secondary | ICD-10-CM | POA: Diagnosis not present

## 2016-04-03 DIAGNOSIS — I110 Hypertensive heart disease with heart failure: Secondary | ICD-10-CM | POA: Insufficient documentation

## 2016-04-03 DIAGNOSIS — Z7901 Long term (current) use of anticoagulants: Secondary | ICD-10-CM | POA: Diagnosis not present

## 2016-04-03 DIAGNOSIS — R42 Dizziness and giddiness: Secondary | ICD-10-CM | POA: Diagnosis not present

## 2016-04-03 DIAGNOSIS — J449 Chronic obstructive pulmonary disease, unspecified: Secondary | ICD-10-CM | POA: Diagnosis not present

## 2016-04-03 DIAGNOSIS — I5042 Chronic combined systolic (congestive) and diastolic (congestive) heart failure: Secondary | ICD-10-CM | POA: Diagnosis not present

## 2016-04-03 DIAGNOSIS — Z950 Presence of cardiac pacemaker: Secondary | ICD-10-CM | POA: Insufficient documentation

## 2016-04-03 DIAGNOSIS — I2510 Atherosclerotic heart disease of native coronary artery without angina pectoris: Secondary | ICD-10-CM | POA: Insufficient documentation

## 2016-04-03 DIAGNOSIS — Z955 Presence of coronary angioplasty implant and graft: Secondary | ICD-10-CM | POA: Diagnosis not present

## 2016-04-03 LAB — CBC WITH DIFFERENTIAL/PLATELET
BASOS PCT: 1 %
Basophils Absolute: 0 10*3/uL (ref 0.0–0.1)
EOS ABS: 0.2 10*3/uL (ref 0.0–0.7)
EOS PCT: 3 %
HCT: 36.5 % (ref 36.0–46.0)
Hemoglobin: 12.1 g/dL (ref 12.0–15.0)
LYMPHS ABS: 1.5 10*3/uL (ref 0.7–4.0)
Lymphocytes Relative: 30 %
MCH: 30 pg (ref 26.0–34.0)
MCHC: 33.2 g/dL (ref 30.0–36.0)
MCV: 90.6 fL (ref 78.0–100.0)
MONO ABS: 0.4 10*3/uL (ref 0.1–1.0)
MONOS PCT: 7 %
NEUTROS PCT: 59 %
Neutro Abs: 3 10*3/uL (ref 1.7–7.7)
PLATELETS: 108 10*3/uL — AB (ref 150–400)
RBC: 4.03 MIL/uL (ref 3.87–5.11)
RDW: 13.1 % (ref 11.5–15.5)
WBC: 5 10*3/uL (ref 4.0–10.5)

## 2016-04-03 LAB — BASIC METABOLIC PANEL
Anion gap: 7 (ref 5–15)
BUN: 11 mg/dL (ref 6–20)
CALCIUM: 8.6 mg/dL — AB (ref 8.9–10.3)
CHLORIDE: 105 mmol/L (ref 101–111)
CO2: 25 mmol/L (ref 22–32)
CREATININE: 0.78 mg/dL (ref 0.44–1.00)
GFR calc non Af Amer: >60 mL/min (ref >60)
Glucose, Bld: 87 mg/dL (ref 65–99)
Potassium: 3.7 mmol/L (ref 3.5–5.1)
SODIUM: 137 mmol/L (ref 135–145)

## 2016-04-03 LAB — TROPONIN I

## 2016-04-03 MED ORDER — SODIUM CHLORIDE 0.9 % IV BOLUS (SEPSIS)
500.0000 mL | Freq: Once | INTRAVENOUS | Status: AC
  Administered 2016-04-03: 500 mL via INTRAVENOUS

## 2016-04-03 NOTE — ED Notes (Signed)
ED Provider at bedside. 

## 2016-04-03 NOTE — ED Provider Notes (Signed)
[facility] DEPT Provider Note   CSN: [id number] Arrival date & time: 04/03/16  1530     History   Chief Complaint Chief Complaint  Patient presents with  . Dizziness    HPI [name] [name] is a 82 y.o. female.  Patient with history of cardiomyopathy, pacemaker, bundle-branch block, atrial fibrillation, coronary disease, high blood pressure presents with lightheadedness worse with standing. Worsens prior to arrival. No syncope. No chest pain shortness of breath abdominal pain headache or other symptoms. Patient had multiple reasons for this including minimal oral intake, wine and taking her medications back to back after she missed a dose. Patient had recent check of her pacemaker.      Past Medical History:  Diagnosis Date  . Atrial fibrillation ([place]) 10/06/2007   post op  . COPD (chronic obstructive pulmonary disease) ([place])    Emphysema on 02/03/13 CXR  . CORONARY ARTERY DISEASE 10/06/2007   a. s/p PCI to LAD; b. s/p CABG; c. LHC 07/13/12: Proximal LAD stent patent, LIMA-LAD atretic, D1 occluded, proximal circumflex 30%, mid RCA occluded, SVG-D1 normal, SVG-OM2 normal, SVG-distal RCA normal, EF 40% with diffuse HK  . Dizziness   . History of colonic polyps 10/30/2009   No polyps in 2011. No repeat.    Marland Kitchen HYPERLIPIDEMIA 10/06/2007  . HYPERTENSION 10/06/2007  . LBBB 09/06/2008  . MELANOMA 09/06/2008  . MYOCARDIAL INFARCTION, HX OF 10/06/2007  . NICM (nonischemic cardiomyopathy) ([place])    Echocardiogram 07/12/12: EF 25-30%, diffuse HK, mild AI, mild MR, mild LAE  . PERSONAL HX COLONIC POLYPS 10/30/2009  . Syncope   . UTERINE CANCER, HX OF 09/06/2008  . VARICOSE VEIN 09/29/2009    Patient Active Problem List   Diagnosis Date Noted  . Microscopic hematuria 09/02/2015  . CAD (coronary artery disease) 04/17/2014  . Allergic rhinitis 04/17/2014  . Mild cognitive impairment 04/17/2014  . Hearing loss 04/17/2014  . Pacemaker -CRT-St. Jude 05/21/2013  . Atrioventricular block,  complete ([place]) 03/08/2013  . Paroxysmal atrial fibrillation ([place]) 02/03/2013  . Asthma, intrinsic 02/03/2013  . Chronic combined systolic and diastolic congestive heart failure ([place]) 10/10/2012  . Depression 09/19/2012  . Varicose veins 09/29/2009  . Melanoma of skin ([place]) 09/06/2008  . LBBB (left bundle branch block) 09/06/2008  . Uterine cancer ([place]) 09/06/2008  . Hyperlipidemia 10/06/2007  . Essential hypertension 10/06/2007  . Ischemic cardiomyopathy  EF 30% cath 4/14 with stent 10/06/2007    Past Surgical History:  Procedure Laterality Date  . ABDOMINAL HYSTERECTOMY  1988  . BI-VENTRICULAR PACEMAKER INSERTION (CRT-P)  02/02/2013   St. Jude, serial no. [id number]   . CARDIAC CATHETERIZATION  08/30/2008  . CORONARY ANGIOPLASTY WITH STENT PLACEMENT  2009  . CORONARY ARTERY BYPASS GRAFT  2004  . LEFT HEART CATHETERIZATION WITH CORONARY ANGIOGRAM Bilateral 07/13/2012   Procedure: LEFT HEART CATHETERIZATION WITH CORONARY ANGIOGRAM;  Surgeon: Peter M Martinique, MD;  Location: [facility] CATH LAB;  Service: Cardiovascular;  Laterality: Bilateral;  . OOPHORECTOMY    . PERMANENT PACEMAKER INSERTION N/A 02/02/2013   Procedure: PERMANENT PACEMAKER INSERTION;  Surgeon: Evans Lance, MD;  Location: [facility] CATH LAB;  Service: Cardiovascular;  Laterality: N/A;  . right distal pretibeal     melanoma    OB History    No data available       Home Medications    Prior to Admission medications   Medication Sig Start Date End Date Taking? Authorizing Provider  albuterol (VENTOLIN HFA) 108 (90 Base) MCG/ACT inhaler Inhale 2 puffs into the lungs  every 4 (four) hours as needed. 12/03/15   Collene Gobble, MD  atorvastatin (LIPITOR) 20 MG tablet TAKE 1 TABLET(20 MG) BY MOUTH DAILY 03/01/16   Marin Olp, MD  carvedilol (COREG) 3.125 MG tablet TAKE 1 TABLET BY MOUTH TWICE DAILY WITH A MEAL. 09/18/15   Deboraha Sprang, MD  ELIQUIS 5 MG TABS tablet TAKE 1 TABLET BY MOUTH TWICE DAILY 10/07/15   Deboraha Sprang, MD    escitalopram (LEXAPRO) 10 MG tablet TAKE 1 TABLET BY MOUTH DAILY 11/03/15   Marin Olp, MD  fexofenadine (ALLEGRA) 60 MG tablet Take 60 mg by mouth.    Historical Provider, MD  lisinopril (PRINIVIL,ZESTRIL) 2.5 MG tablet Take 1 tablet (2.5 mg total) by mouth daily. 09/05/15   Deboraha Sprang, MD  pyridostigmine (MESTINON) 60 MG tablet TAKE 1 TABLET(60 MG) BY MOUTH TWICE DAILY 11/03/15   Marin Olp, MD    Family History Family History  Problem Relation Age of Onset  . Stroke Mother   . Heart attack Father   . Colon cancer Neg Hx     Social History Social History  Substance Use Topics  . Smoking status: Never Smoker  . Smokeless tobacco: Never Used  . Alcohol use Yes     Comment: daily gin or wine     Allergies   Aricept [donepezil] and Nitroglycerin   Review of Systems Review of Systems  Constitutional: Positive for fatigue. Negative for chills and fever.  HENT: Negative for sore throat.   Eyes: Negative for visual disturbance.  Respiratory: Negative for cough and shortness of breath.   Cardiovascular: Negative for chest pain and palpitations.  Gastrointestinal: Negative for abdominal pain and vomiting.  Genitourinary: Negative for dysuria and hematuria.  Musculoskeletal: Negative for arthralgias and back pain.  Skin: Negative for color change and rash.  Neurological: Positive for light-headedness. Negative for dizziness, seizures and syncope.  All other systems reviewed and are negative.    Physical Exam Updated Vital Signs BP 157/93   Pulse 64   Temp 97.7 F (36.5 C) (Oral)   Resp 24   Ht 5\' 6"  (1.676 m)   Wt 146 lb (66.2 kg)   SpO2 98%   BMI 23.57 kg/m   Physical Exam  Constitutional: She appears well-developed and well-nourished. No distress.  HENT:  Head: Normocephalic and atraumatic.  Eyes: Conjunctivae are normal.  Neck: Neck supple.  Cardiovascular: Normal rate and regular rhythm.   No murmur heard. Pulmonary/Chest: Effort normal and  breath sounds normal. No respiratory distress.  Abdominal: Soft. There is no tenderness.  Musculoskeletal: She exhibits no edema.  Neurological: She is alert.  Skin: Skin is warm and dry.  Psychiatric: She has a normal mood and affect.  Nursing note and vitals reviewed.    ED Treatments / Results  Labs (all labs ordered are listed, but only abnormal results are displayed) Labs Reviewed  CBC WITH DIFFERENTIAL/PLATELET  BASIC METABOLIC PANEL  TROPONIN I    EKG  EKG Interpretation None     EKG reviewed heart rate 67, prolonged QT, bundle-branch block/paste  Radiology No results found.  Procedures Procedures (including critical care time)  Medications Ordered in ED Medications  sodium chloride 0.9 % bolus 500 mL (not administered)     Initial Impression / Assessment and Plan / ED Course  I have reviewed the triage vital signs and the nursing notes.  Pertinent labs & imaging results that were available during my care of the patient were reviewed  by me and considered in my medical decision making (see chart for details).  Clinical Course    Patient presents with orthostatic/lightheaded symptoms. Multiple different reasons for this. Patient has no focal neuro deficits. This is likely secondary to taking extra medication, mild dehydration and alcohol use mild. Plan for screening blood work, IV and oral fluids, EKG. Patient improved significantly and reassessment ambulated without difficulty. Patient family comfortable close outpatient follow-up. Blood work unremarkable. Results and differential diagnosis were discussed with the patient/parent/guardian. Xrays were independently reviewed by myself.  Close follow up outpatient was discussed, comfortable with the plan.   Medications  sodium chloride 0.9 % bolus 500 mL (not administered)    Vitals:   04/03/16 1545 04/03/16 1547 04/03/16 1548 04/03/16 1600  BP: 139/74 139/74  157/93  Pulse: (!) 57 106  64  Resp: 16 16   24   Temp:  97.7 F (36.5 C)    TempSrc:  Oral    SpO2: 98% 98%  98%  Weight:   146 lb (66.2 kg)   Height:  5\' 6"  (1.676 m)      Final diagnoses:  Lightheadedness     Final Clinical Impressions(s) / ED Diagnoses   Final diagnoses:  Lightheadedness    New Prescriptions New Prescriptions   No medications on file     Elnora Morrison, MD 04/03/16 1750

## 2016-04-03 NOTE — Discharge Instructions (Addendum)
Stay well hydrated.  If you feel lightheaded stay seated, have someone bring you something to drink.   If you were given medicines take as directed.  If you are on coumadin or contraceptives realize their levels and effectiveness is altered by many different medicines.  If you have any reaction (rash, tongues swelling, other) to the medicines stop taking and see a physician.    If your blood pressure was elevated in the ER make sure you follow up for management with a primary doctor or return for chest pain, shortness of breath or stroke symptoms.  Please follow up as directed and return to the ER or see a physician for new or worsening symptoms.  Thank you. Vitals:   04/03/16 1600 04/03/16 1615 04/03/16 1645 04/03/16 1700  BP: 157/93 153/81 139/72 151/67  Pulse: 64 66 67 61  Resp: 24 15 20 18   Temp:      TempSrc:      SpO2: 98% 100% 98% 96%  Weight:      Height:

## 2016-04-03 NOTE — ED Notes (Signed)
Patient ambulated to the restroom with steady gait and stand by assist.

## 2016-04-03 NOTE — ED Triage Notes (Signed)
Pt arrives from home via [facility] reporting lightheadedness and some dizziness after running errands this am.  Pt's family denies changes to mental status, focal deficits.  Pt denies CP, reports increased fatigue.  Pt's family reports pt took morning meds twice today. EMS reports positive ortho VS.

## 2016-04-06 ENCOUNTER — Telehealth: Payer: Self-pay | Admitting: Internal Medicine

## 2016-04-06 DIAGNOSIS — I5042 Chronic combined systolic (congestive) and diastolic (congestive) heart failure: Secondary | ICD-10-CM

## 2016-04-06 NOTE — Telephone Encounter (Signed)
Called and spoke with the patient's daughter. There is some question on their side as to whether she is taking more medications and has been prescribed. She notes that upon EMS arrival her mom had some orthostatic changes in her blood pressure She felt better  following hydration in the emergency room this true not withstanding a normal BUN/creatinine ratio and no objective orthostasis recorded in the ER. I reiterated to the patient's daughter some binder. We will recheck an cardiac ultrasound to see if there is interval improvement which might give [place] greaer  License to stop ACE inhibitor or beta  blockers., I suggested if symptoms of falling risks are sufficiently high then the ACE inhibitor beta blockers could be discontinued ;

## 2016-04-06 NOTE — Telephone Encounter (Signed)
Per ER note- 12/23:  "Patient presents with orthostatic/lightheaded symptoms. Multiple different reasons for this. Patient has no focal neuro deficits. This is likely secondary to taking extra medication, mild dehydration and alcohol use mild. Plan for screening blood work, IV and oral fluids, EKG. Patient improved significantly and reassessment ambulated without difficulty. Patient family comfortable close outpatient follow-up. Blood work unremarkable. Results and differential diagnosis were discussed with the patient/parent/guardian. Xrays were independently reviewed by myself.  Close follow up outpatient was discussed, comfortable with the plan."  According to the patient's medication list, she is currently taking: - coreg 3.125 mg BID - lisinopril 2.5 mg daily  - eliquis 5 mg BID  Will forward to Dr. Caryl Comes to review.

## 2016-04-06 NOTE — Telephone Encounter (Signed)
New Message  Pt c/o medication issue:  1. Name of Medication: Eliquis  2. How are you currently taking this medication (dosage and times per day)? eliquis 5 mg tablet twice daily  3. Are you having a reaction (difficulty breathing--STAT)? N/A  4. What is your medication issue? Pts daughter voiced adjustment to cardiac medications may need to be adjusted to do being dizzy and after ED discharged pt has continued to be Dizzy.  Please f/u with pts daughter

## 2016-04-06 NOTE — Telephone Encounter (Signed)
Echo ordered and message sent to [facility] to schedule.

## 2016-04-09 ENCOUNTER — Encounter: Payer: Medicare Other | Admitting: Physician Assistant

## 2016-04-09 ENCOUNTER — Other Ambulatory Visit: Payer: Self-pay

## 2016-04-09 ENCOUNTER — Ambulatory Visit (HOSPITAL_COMMUNITY): Payer: Medicare Other | Attending: Cardiovascular Disease

## 2016-04-09 DIAGNOSIS — E785 Hyperlipidemia, unspecified: Secondary | ICD-10-CM | POA: Insufficient documentation

## 2016-04-09 DIAGNOSIS — I442 Atrioventricular block, complete: Secondary | ICD-10-CM | POA: Insufficient documentation

## 2016-04-09 DIAGNOSIS — I340 Nonrheumatic mitral (valve) insufficiency: Secondary | ICD-10-CM | POA: Insufficient documentation

## 2016-04-09 DIAGNOSIS — I110 Hypertensive heart disease with heart failure: Secondary | ICD-10-CM | POA: Diagnosis not present

## 2016-04-09 DIAGNOSIS — Z950 Presence of cardiac pacemaker: Secondary | ICD-10-CM | POA: Diagnosis not present

## 2016-04-09 DIAGNOSIS — I447 Left bundle-branch block, unspecified: Secondary | ICD-10-CM | POA: Diagnosis not present

## 2016-04-09 DIAGNOSIS — I2510 Atherosclerotic heart disease of native coronary artery without angina pectoris: Secondary | ICD-10-CM | POA: Insufficient documentation

## 2016-04-09 DIAGNOSIS — I422 Other hypertrophic cardiomyopathy: Secondary | ICD-10-CM | POA: Diagnosis not present

## 2016-04-09 DIAGNOSIS — I5042 Chronic combined systolic (congestive) and diastolic (congestive) heart failure: Secondary | ICD-10-CM | POA: Insufficient documentation

## 2016-04-09 DIAGNOSIS — I509 Heart failure, unspecified: Secondary | ICD-10-CM | POA: Diagnosis present

## 2016-04-14 ENCOUNTER — Other Ambulatory Visit: Payer: Self-pay | Admitting: Pharmacist

## 2016-04-14 ENCOUNTER — Telehealth: Payer: Self-pay | Admitting: Pharmacist

## 2016-04-14 NOTE — Telephone Encounter (Signed)
Spoke with Dr Caryl Comes regarding medication management for pt. He spoke with Ulice Dash last night after email was sent. Echo from 04/09/16 showed LVEF of 30-35%. Difficult balance between mortality benefit of BB and ACEi with reduced EF and patient's increasing age/dementia/risk for side effects and falls. Since pt has been feeling better off of AM dose of carvedilol, discussed with Dr Caryl Comes and will discontinue carvedilol altogether (would not receive full benefit with daily dosing or with low 3.125mg  dose). Will continue with lisinopril 2.5mg  in the evening. Ulice Dash had also inquired about stopping Lipitor - Dr Caryl Comes is ok with this. Called Ulice Dash to relay medication updates per Dr Olin Pia recommendation. Med list updated and no further questions.

## 2016-04-14 NOTE — Telephone Encounter (Signed)
Email from patient's son-in-law 04/13/16:  Hi Megan: Recall we spoke the other day regarding [name] [name] ([place], [place] 06-25-1931), after her event (dizzy-requiring [employer] first responder/EMS and transport to [facility] ED 23-DEC-17). She continued daily to have dizziness that required her to lie recumbent for some period of time-always occurring mid-morning to around noon-day time-and [place] getting a call from her and we having to respond to her home to assist. We took blood pressure measurements during all of these episodes-and she never had "bottomed out"-pulse remained above 60 and only occasionally did she appear to be orthostatic (performed lying, sitting and standing with corresponding pulse rates).   I indicated when last we spoke that I pondered if her BP medications might be at play in causing the dizziness-though I am not sure why "December 23" happened to be the day/date that this would start-having been on this for some time.   Knowing that she was going to have an echo performed on 29-DEC-17 (done) that could address the potential "de-prescribing" of her ACEI (Lisinopril 2.5mg  Q HS)-since the dizziness continued unabated from the 23-DEC-17 on, I have been "HOLDING" the PM dose of Lisinopril. Unfortunately, the dizziness continued up until about 2 days ago. At that time, 2 days ago-I actually began holding her AM dose of carvedilol 3.125mg  BID as well. Since having held her carvedilol-she is no longer symptomatic with dizziness in the mid-mornings for the 2 days subsequent to holding carvedilol. I know from my discussion with Kennyth Lose as well as yourself, my wife-the NP and Dr. Ledora Bottcher importance of the carvedilol. That having been said, with an absence of symptoms now, I am uncertain as to how to proceed with the carvedilol. Can she get all of the benefit she needs from just taking the PM dose-or must (based upon pharmacokinetics, etc.) it be administered BID?  Am providing this  additional observation for you to share with Dr. Caryl Comes upon his return tomorrow-and hopefully his ability to see the 2D echo results from 29-DEC-17.   Thanks, Ulice Dash

## 2016-04-21 ENCOUNTER — Ambulatory Visit (INDEPENDENT_AMBULATORY_CARE_PROVIDER_SITE_OTHER): Payer: Medicare Other | Admitting: Family Medicine

## 2016-04-21 VITALS — BP 130/80 | HR 66

## 2016-04-21 DIAGNOSIS — R42 Dizziness and giddiness: Secondary | ICD-10-CM | POA: Diagnosis not present

## 2016-04-21 LAB — BASIC METABOLIC PANEL
BUN: 9 mg/dL (ref 6–23)
CHLORIDE: 101 meq/L (ref 96–112)
CO2: 32 mEq/L (ref 19–32)
Calcium: 9.4 mg/dL (ref 8.4–10.5)
Creatinine, Ser: 0.75 mg/dL (ref 0.40–1.20)
GFR: 78.54 mL/min (ref >60.00)
Glucose, Bld: 90 mg/dL (ref 70–99)
POTASSIUM: 3.8 meq/L (ref 3.5–5.1)
SODIUM: 138 meq/L (ref 135–145)

## 2016-04-21 LAB — POCT URINALYSIS DIPSTICK
Bilirubin, UA: NEGATIVE
Glucose, UA: NEGATIVE
Ketones, UA: NEGATIVE
LEUKOCYTES UA: NEGATIVE
NITRITE UA: NEGATIVE
PH UA: 7
PROTEIN UA: NEGATIVE
Spec Grav, UA: 1.01
UROBILINOGEN UA: 0.2

## 2016-04-21 NOTE — Progress Notes (Signed)
Pre visit review using our clinic review tool, if applicable. No additional management support is needed unless otherwise documented below in the visit note. 

## 2016-04-21 NOTE — Progress Notes (Signed)
Subjective:     Patient ID: [name] [name], female   DOB: [birth year], 83 y.o.   MRN: [id number]  HPI Patient seen today as a work in with some persistent positional dizziness and lightheadedness.  She has apparently had similar problems intermittently for several years. Her chronic problems include history of paroxysmal atrial fibrillation, left bundle branch block, ischemic cardiomyopathy with recent ejection fraction around 35%, hypertension, mild cognitive impairment, remote history of melanoma, hyperlipidemia.  She apparently has had similar symptoms of lightheadedness and dizziness previously. Most recent episode occurred December 23rd. She was seen in the ED. She apparently did have some mild orthostatic changes and symptoms improved following 500 mL bolus. Her lab work and that time including CBC, troponin, basic metabolic panel was normal. There was some concern whether her symptoms may be related to ACE inhibitor or beta blocker use. She had recent echocardiogram which showed stable ejection fraction. Beta blocker was discontinued per cardiology. She remains on very low-dose ACE inhibitor. She is describing lightheadedness that is worse when she stands up or sits up and better supine. Denies any vertigo. No syncope. No headaches. Denies nausea, vomiting, or diarrhea. She has pacemaker gets that checked regularly. She has been consuming lots of water recently. She states she had about 6-8 full glasses yesterday. Denies any dysuria. No focal weakness. No headaches. No worsening confusion. No ataxia.  She apparently saw neurophysiologist in [place] several years ago and was started on Mestinon. She does not have any prior diagnosis of myasthenia gravis. She had MRI the brain 2014 which showed chronic microvascular ischemic changes but no other acute abnormalities.  Symptoms definitely appear to be more positional. There have been previous recommendations from cardiology to use compression on her  lower extremities and abdominal binder but apparently she refused. Her current medications include Eliquis 5 mg twice daily, Lexapro 10 mg daily, lisinopril 2.5 mg daily, and Mestinon 60 mg one half tablet 3 times daily  She denies any recent fall or head injury. She has a cane at home but does not uses consistently. She has not had any sort of recent neurologic evaluation  Past Medical History:  Diagnosis Date  . Atrial fibrillation ([place]) 10/06/2007   post op  . COPD (chronic obstructive pulmonary disease) ([place])    Emphysema on 02/03/13 CXR  . CORONARY ARTERY DISEASE 10/06/2007   a. s/p PCI to LAD; b. s/p CABG; c. LHC 07/13/12: Proximal LAD stent patent, LIMA-LAD atretic, D1 occluded, proximal circumflex 30%, mid RCA occluded, SVG-D1 normal, SVG-OM2 normal, SVG-distal RCA normal, EF 40% with diffuse HK  . Dizziness   . History of colonic polyps 10/30/2009   No polyps in 2011. No repeat.    Marland Kitchen HYPERLIPIDEMIA 10/06/2007  . HYPERTENSION 10/06/2007  . LBBB 09/06/2008  . MELANOMA 09/06/2008  . MYOCARDIAL INFARCTION, HX OF 10/06/2007  . NICM (nonischemic cardiomyopathy) ([place])    Echocardiogram 07/12/12: EF 25-30%, diffuse HK, mild AI, mild MR, mild LAE  . PERSONAL HX COLONIC POLYPS 10/30/2009  . Syncope   . UTERINE CANCER, HX OF 09/06/2008  . VARICOSE VEIN 09/29/2009   Past Surgical History:  Procedure Laterality Date  . ABDOMINAL HYSTERECTOMY  1988  . BI-VENTRICULAR PACEMAKER INSERTION (CRT-P)  02/02/2013   St. Jude, serial no. [id number]   . CARDIAC CATHETERIZATION  08/30/2008  . CORONARY ANGIOPLASTY WITH STENT PLACEMENT  2009  . CORONARY ARTERY BYPASS GRAFT  2004  . LEFT HEART CATHETERIZATION WITH CORONARY ANGIOGRAM Bilateral 07/13/2012   Procedure: LEFT HEART CATHETERIZATION  WITH CORONARY ANGIOGRAM;  Surgeon: Peter M Martinique, MD;  Location: [facility] CATH LAB;  Service: Cardiovascular;  Laterality: Bilateral;  . OOPHORECTOMY    . PERMANENT PACEMAKER INSERTION N/A 02/02/2013   Procedure: PERMANENT  PACEMAKER INSERTION;  Surgeon: Evans Lance, MD;  Location: [facility] CATH LAB;  Service: Cardiovascular;  Laterality: N/A;  . right distal pretibeal     melanoma    reports that she has never smoked. She has never used smokeless tobacco. She reports that she drinks alcohol. She reports that she does not use drugs. family history includes Heart attack in her father; Stroke in her mother. Allergies  Allergen Reactions  . Aricept [Donepezil] Shortness Of Breath and Other (See Comments)    Hallucinations and loss of sensation in legs  . Nitroglycerin Other (See Comments)    sensitive to tabs which causes rapid drop in blood pressure. Is ok to use oral spray form     Review of Systems  Constitutional: Negative for chills and fever.  Respiratory: Negative for cough and shortness of breath.   Cardiovascular: Negative for chest pain, palpitations and leg swelling.  Gastrointestinal: Negative for abdominal pain, diarrhea, nausea and vomiting.  Endocrine: Negative for polydipsia and polyuria.  Genitourinary: Negative for dysuria.  Neurological: Positive for dizziness and light-headedness. Negative for tremors, seizures, syncope, facial asymmetry, weakness and headaches.  Psychiatric/Behavioral: Negative for hallucinations.       Objective:   Physical Exam  Constitutional: She is oriented to person, place, and time. She appears well-developed and well-nourished.  HENT:  Mouth/Throat: Oropharynx is clear and moist.  Neck: Neck supple.  Cardiovascular: Normal rate.   Slightly irregular rhythm but rate controlled  Pulmonary/Chest: Effort normal and breath sounds normal. No respiratory distress. She has no wheezes. She has no rales.  Musculoskeletal: She exhibits no edema.  Lymphadenopathy:    She has no cervical adenopathy.  Neurological: She is alert and oriented to person, place, and time. No cranial nerve deficit. Coordination normal.  No focal strength deficits. She is able to ambulate  unassisted down the hallway but with somewhat of a slow gait No focal weakness  Psychiatric: She has a normal mood and affect. Her behavior is normal.       Assessment:     Recurrent dizziness/lightheadedness. By history, her symptoms are positional but she does not demonstrate any orthostatic change today. Her sitting blood pressure was 124/64 and standing 122/60. Nevertheless, she had symptoms of lightheadedness with position change. Not improved with recent discontinuation of beta blocker    Plan:     -Check urinalysis (dipstick unremarkable) -Recheck basic metabolic panel. Make sure she doesn't have hyponatremia with recent increased free water consumption -Would consider neurology assessment since she's had fairly extensive cardiac evaluation with no clear etiology.  I spoke with patient and her daughter regarding this and they are both in agreement. We will set up referral  Eulas Post MD [facility]

## 2016-04-21 NOTE — Patient Instructions (Signed)
Fall Prevention in the Home Falls can cause injuries and can affect people from all age groups. There are many simple things that you can do to make your home safe and to help prevent falls. What can I do on the outside of my home?  Regularly repair the edges of walkways and driveways and fix any cracks.  Remove high doorway thresholds.  Trim any shrubbery on the main path into your home.  Use bright outdoor lighting.  Clear walkways of debris and clutter, including tools and rocks.  Regularly check that handrails are securely fastened and in good repair. Both sides of any steps should have handrails.  Install guardrails along the edges of any raised decks or porches.  Have leaves, snow, and ice cleared regularly.  Use sand or salt on walkways during winter months.  In the garage, clean up any spills right away, including grease or oil spills. What can I do in the bathroom?  Use night lights.  Install grab bars by the toilet and in the tub and shower. Do not use towel bars as grab bars.  Use non-skid mats or decals on the floor of the tub or shower.  If you need to sit down while you are in the shower, use a plastic, non-slip stool.  Keep the floor dry. Immediately clean up any water that spills on the floor.  Remove soap buildup in the tub or shower on a regular basis.  Attach bath mats securely with double-sided non-slip rug tape.  Remove throw rugs and other tripping hazards from the floor. What can I do in the bedroom?  Use night lights.  Make sure that a bedside light is easy to reach.  Do not use oversized bedding that drapes onto the floor.  Have a firm chair that has side arms to use for getting dressed.  Remove throw rugs and other tripping hazards from the floor. What can I do in the kitchen?  Clean up any spills right away.  Avoid walking on wet floors.  Place frequently used items in easy-to-reach places.  If you need to reach for something above  you, use a sturdy step stool that has a grab bar.  Keep electrical cables out of the way.  Do not use floor polish or wax that makes floors slippery. If you have to use wax, make sure that it is non-skid floor wax.  Remove throw rugs and other tripping hazards from the floor. What can I do in the stairways?  Do not leave any items on the stairs.  Make sure that there are handrails on both sides of the stairs. Fix handrails that are broken or loose. Make sure that handrails are as long as the stairways.  Check any carpeting to make sure that it is firmly attached to the stairs. Fix any carpet that is loose or worn.  Avoid having throw rugs at the top or bottom of stairways, or secure the rugs with carpet tape to prevent them from moving.  Make sure that you have a light switch at the top of the stairs and the bottom of the stairs. If you do not have them, have them installed. What are some other fall prevention tips?  Wear closed-toe shoes that fit well and support your feet. Wear shoes that have rubber soles or low heels.  When you use a stepladder, make sure that it is completely opened and that the sides are firmly locked. Have someone hold the ladder while you are using   it. Do not climb a closed stepladder.  Add color or contrast paint or tape to grab bars and handrails in your home. Place contrasting color strips on the first and last steps.  Use mobility aids as needed, such as canes, walkers, scooters, and crutches.  Turn on lights if it is dark. Replace any light bulbs that burn out.  Set up furniture so that there are clear paths. Keep the furniture in the same spot.  Fix any uneven floor surfaces.  Choose a carpet design that does not hide the edge of steps of a stairway.  Be aware of any and all pets.  Review your medicines with your healthcare provider. Some medicines can cause dizziness or changes in blood pressure, which increase your risk of falling. Talk with  your health care provider about other ways that you can decrease your risk of falls. This may include working with a physical therapist or trainer to improve your strength, balance, and endurance. This information is not intended to replace advice given to you by your health care provider. Make sure you discuss any questions you have with your health care provider. Document Released: 03/19/2002 Document Revised: 08/26/2015 Document Reviewed: 05/03/2014 Elsevier Interactive Patient Education  2017 [employer]  

## 2016-04-26 ENCOUNTER — Telehealth: Payer: Self-pay | Admitting: Internal Medicine

## 2016-04-26 ENCOUNTER — Encounter: Payer: Self-pay | Admitting: Neurology

## 2016-04-26 ENCOUNTER — Other Ambulatory Visit (INDEPENDENT_AMBULATORY_CARE_PROVIDER_SITE_OTHER): Payer: Medicare Other

## 2016-04-26 ENCOUNTER — Ambulatory Visit (INDEPENDENT_AMBULATORY_CARE_PROVIDER_SITE_OTHER): Payer: Medicare Other | Admitting: Neurology

## 2016-04-26 VITALS — BP 90/60 | HR 80 | Ht 68.0 in | Wt 135.3 lb

## 2016-04-26 DIAGNOSIS — R42 Dizziness and giddiness: Secondary | ICD-10-CM | POA: Diagnosis not present

## 2016-04-26 LAB — VITAMIN B12: VITAMIN B 12: 364 pg/mL (ref 211–911)

## 2016-04-26 NOTE — Telephone Encounter (Signed)
New message  Jorene Guest call requesting to speak with RN. Mr Elie Confer states he will discuss with nurse when she returns the call. Please call back to discuss

## 2016-04-26 NOTE — Progress Notes (Signed)
[facility] Clinic Note - Initial Visit   Date: 04/26/16  [name] [name] MRN: [id number] DOB: [birth year]   Dear Dr. Elease Hashimoto:  Thank you for your kind referral of [name] [name] for consultation of lightheadedness. Although her history is well known to you, please allow [place] to reiterate it for the purpose of our medical record. The patient was accompanied to the clinic by son-in-law (pharmacist) who also provides collateral information.     History of Present Illness: [name] [name] is a 83 y.o. right-handed Caucasian female with PAF s/p PPM (on Eliquis), CAD s/p PCI, NICM EF 35%, hypertension, mild cognitive impairment, remote history of melanoma, hyperlipidemia presenting for evaluation of lightheadedness.    Starting in 2014, she began having spells of lightheadedness, described as unsteadiness.  She denies dizziness with room spinning.  Review of Epic notes shows that she had a syncopal spell in March 2014 after getting up from the breakfast table to walk to the bathroom.  This lasted a few seconds. She had another episode a couple of days later.  A Holter for 30 days showed occasional SVT. During another episode of severe dizziness, a PPM was placed because she was found to have heart block (2 to 1 block) in October 2014.  Following the PPM, the patient improved slightly, but continued to have lightheadedness especially with prolonged standing. She tried midodrine 2.5mg  before PPM placement and that did not help. She saw Dr. Debria Garret at [facility] neurology who started her on mestinon 30mg  TID and cannot tell whether it helped or not.    On December 23rd, she had another spell of lightheadedness and she activated her medical alert system. She did not loose consciousness.  She felt confused.  EMS arrived and transported her to the emergency deparment where she was found to be orthostatic. She continues to have lightheadedness in the morning and then improves by the evening.   She had noticed that abrupt movements exacerbates symptoms. She is noncompliant with her abdominal binder and stockings.  She had tried drinking lots of fluids, but did not find benefit.  She lives alone and reports to eating small meals. Breakfast consists of tea and a breakfast bar, lunch is a sandwich, and dinner is microwave lean cuisine meal.  She does not cook.  She had lost 15lb over the past year unintentionally.  Most recently, she is followed by Dr. Caryl Comes for her cardiac disease and orthostatic hypotension.  Notes indicate that her syncopal spells may have initially been due to high-grade heart block and orthostatic hypotension.  Due to her low blood pressure, her coreg was discontinued a few weeks ago.  She has not noticed a marked difference in her symptoms.  She denies dry eyes, dry mouth, urinary retention, slowed movements, gait imbalance, or falls.    Out-side paper records, electronic medical record, and images have been reviewed where available and summarized as:   ARS 05/09/2013: This is an abnormal study although limited because of the pacemaker. There is evidence for patchy autonomic failure with orthostatic hypotension. Clinical correlation is suggested.   NCS/EMG 05/09/2013: This is a normal study. Specifically, there is no electrophysiological evidence to suggest the presence of peripheral neuropathy on this test.    MRI brain 07/11/2012: No acute infarct. Prominent small vessel disease type changes. Global atrophy without hydrocephalus. Right frontal region sub centimeter calcified structure may represent a small meningioma and is without change.  No associate mass effect.  Cervical spondylotic changes with spinal stenosis and cord  flattening most prominent C4-5 level.  MRA head 07/11/2012: Left vertebral artery is diminutive in size after the takeoff of the left PICA.  The right vertebral artery is dominant and ectatic. No significant stenosis of the basilar  artery. Anterior circulation without medium or large size vessel significant stenosis or occlusion. Branch vessel irregularity as noted above.  Labs 04/30/2014:  HIV NR, RPR NR, folate 10.4, vitamin B12 276  US carotids 07/12/2012: - No significant extracranial carotid artery stenosisdemonstrated. Vertebrals are patent with antegrade flow. - ICA/CCA ratio: right= 1.01. left =0.88.  Lab Results  Component Value Date   TSH 1.66 08/26/2015   Lab Results  Component Value Date   HGBA1C 5.7 (H) 07/11/2012    Past Medical History:  Diagnosis Date  . Atrial fibrillation ([place]) 10/06/2007   post op  . COPD (chronic obstructive pulmonary disease) ([place])    Emphysema on 02/03/13 CXR  . CORONARY ARTERY DISEASE 10/06/2007   a. s/p PCI to LAD; b. s/p CABG; c. LHC 07/13/12: Proximal LAD stent patent, LIMA-LAD atretic, D1 occluded, proximal circumflex 30%, mid RCA occluded, SVG-D1 normal, SVG-OM2 normal, SVG-distal RCA normal, EF 40% with diffuse HK  . Dizziness   . History of colonic polyps 10/30/2009   No polyps in 2011. No repeat.    Marland Kitchen HYPERLIPIDEMIA 10/06/2007  . HYPERTENSION 10/06/2007  . LBBB 09/06/2008  . MELANOMA 09/06/2008  . MYOCARDIAL INFARCTION, HX OF 10/06/2007  . NICM (nonischemic cardiomyopathy) ([place])    Echocardiogram 07/12/12: EF 25-30%, diffuse HK, mild AI, mild MR, mild LAE  . PERSONAL HX COLONIC POLYPS 10/30/2009  . Syncope   . UTERINE CANCER, HX OF 09/06/2008  . VARICOSE VEIN 09/29/2009    Past Surgical History:  Procedure Laterality Date  . ABDOMINAL HYSTERECTOMY  1988  . BI-VENTRICULAR PACEMAKER INSERTION (CRT-P)  02/02/2013   St. Jude, serial no. [id number]   . CARDIAC CATHETERIZATION  08/30/2008  . CORONARY ANGIOPLASTY WITH STENT PLACEMENT  2009  . CORONARY ARTERY BYPASS GRAFT  2004  . LEFT HEART CATHETERIZATION WITH CORONARY ANGIOGRAM Bilateral 07/13/2012   Procedure: LEFT HEART CATHETERIZATION WITH CORONARY ANGIOGRAM;  Surgeon: Peter M Martinique, MD;  Location: [facility] CATH LAB;   Service: Cardiovascular;  Laterality: Bilateral;  . OOPHORECTOMY    . PERMANENT PACEMAKER INSERTION N/A 02/02/2013   Procedure: PERMANENT PACEMAKER INSERTION;  Surgeon: Evans Lance, MD;  Location: [facility] CATH LAB;  Service: Cardiovascular;  Laterality: N/A;  . right distal pretibeal     melanoma     Medications:  Outpatient Encounter Prescriptions as of 04/26/2016  Medication Sig Note  . albuterol (VENTOLIN HFA) 108 (90 Base) MCG/ACT inhaler Inhale 2 puffs into the lungs every 4 (four) hours as needed.   Marland Kitchen ELIQUIS 5 MG TABS tablet TAKE 1 TABLET BY MOUTH TWICE DAILY   . escitalopram (LEXAPRO) 10 MG tablet TAKE 1 TABLET BY MOUTH DAILY   . fexofenadine (ALLEGRA) 60 MG tablet Take 60 mg by mouth as needed. 12/03/2015: Received from: [facility] Received Sig: Take 60 mg by mouth daily as needed.  Marland Kitchen lisinopril (PRINIVIL,ZESTRIL) 2.5 MG tablet Take 1 tablet (2.5 mg total) by mouth daily.   Marland Kitchen pyridostigmine (MESTINON) 60 MG tablet Take 0.5 tablets (30 mg total) by mouth 3 (three) times daily.    No facility-administered encounter medications on file as of 04/26/2016.      Allergies:  Allergies  Allergen Reactions  . Aricept [Donepezil] Shortness Of Breath and Other (See Comments)    Hallucinations and  loss of sensation in legs  . Nitroglycerin Other (See Comments)    sensitive to tabs which causes rapid drop in blood pressure. Is ok to use oral spray form    Family History: Family History  Problem Relation Age of Onset  . Stroke Mother   . Heart attack Father   . Colon cancer Neg Hx     Social History: Social History  Substance Use Topics  . Smoking status: Never Smoker  . Smokeless tobacco: Never Used  . Alcohol use Yes     Comment: daily gin or wine   Social History   Social History Narrative   Widowed. Lives alone with cat. 3 children. 6 grandkids   Daughter  (NP) and son in Sports coach (pharmacist-teaches at [place]) that live close and help out.        Drives in the daytime, shops for herself, cleaning-has someone over to clean due to the intermittent lightheadedness, bathing      Retired from physical education.           Review of Systems:  CONSTITUTIONAL: No fevers, chills, night sweats,+ 15lb +weight loss.   EYES: No visual changes or eye pain ENT: No hearing changes.  No history of nose bleeds.   RESPIRATORY: No cough, wheezing and shortness of breath.   CARDIOVASCULAR: Negative for chest pain, and palpitations.   GI: Negative for abdominal discomfort, blood in stools or black stools.  No recent change in bowel habits.   GU:  No history of incontinence.   MUSCLOSKELETAL: No history of joint pain or swelling.  No myalgias.   SKIN: Negative for lesions, rash, and itching.   HEMATOLOGY/ONCOLOGY: Negative for prolonged bleeding, bruising easily, and swollen nodes.  +history of cancer.   ENDOCRINE: Negative for cold or heat intolerance, polydipsia or goiter.   PSYCH:  No depression or anxiety symptoms.   NEURO: As Above.   Vital Signs:  BP 90/60   Pulse 80   Ht 5\' 8"  (1.727 m)   Wt 135 lb 5 oz (61.4 kg)   SpO2 97%   BMI 20.57 kg/m    General Medical Exam:   General:  Well appearing, comfortable.   Eyes/ENT: see cranial nerve examination.   Neck: No masses appreciated.  Full range of motion without tenderness.  No carotid bruits. Respiratory:  Clear to auscultation, good air entry bilaterally.   Cardiac:  Regular rate and rhythm, no murmur.   Extremities:  No deformities, edema, or skin discoloration.  Skin:  No rashes or lesions.  Neurological Exam: MENTAL STATUS including orientation to time, place, person, recent and remote memory, attention span and concentration, language, and fund of knowledge is fairly intact.  Speech is not dysarthric.  CRANIAL NERVES: II:  No visual field defects.  Unremarkable fundi.   III-IV-VI: Pupils equal round and reactive to light.  Normal conjugate, extra-ocular eye movements in all  directions of gaze.  No nystagmus.  No ptosis.   V:  Normal facial sensation.  VII:  Normal facial symmetry and movements.  VIII:  Normal hearing and vestibular function.   IX-X:  Normal palatal movement.   XI:  Normal shoulder shrug and head rotation.   XII:  Normal tongue strength and range of motion, no deviation or fasciculation.  MOTOR:  No atrophy, fasciculations or abnormal movements.  No pronator drift.  Tone is normal.    Right Upper Extremity:    Left Upper Extremity:    Deltoid  5/5   Deltoid  5/5  Biceps  5/5   Biceps  5/5   Triceps  5/5   Triceps  5/5   Wrist extensors  5/5   Wrist extensors  5/5   Wrist flexors  5/5   Wrist flexors  5/5   Finger extensors  5/5   Finger extensors  5/5   Finger flexors  5/5   Finger flexors  5/5   Dorsal interossei  5/5   Dorsal interossei  5/5   Abductor pollicis  5/5   Abductor pollicis  5/5   Tone (Ashworth scale)  0  Tone (Ashworth scale)  0   Right Lower Extremity:    Left Lower Extremity:    Hip flexors  5/5   Hip flexors  5/5   Hip extensors  5/5   Hip extensors  5/5   Knee flexors  5/5   Knee flexors  5/5   Knee extensors  5/5   Knee extensors  5/5   Dorsiflexors  5/5   Dorsiflexors  5/5   Plantarflexors  5/5   Plantarflexors  5/5   Toe extensors  5/5   Toe extensors  5/5   Toe flexors  5/5   Toe flexors  5/5   Tone (Ashworth scale)  0  Tone (Ashworth scale)  0   MSRs:  Right                                                                 Left brachioradialis 2+  brachioradialis 2+  biceps 2+  biceps 2+  triceps 2+  triceps 2+  patellar 2+  patellar 2+  ankle jerk 2+  ankle jerk 2+  Hoffman no  Hoffman no  plantar response down  plantar response down   SENSORY:  Normal and symmetric perception of light touch, pinprick, vibration, and proprioception.  Romberg's sign present.   COORDINATION/GAIT: Normal finger-to- nose-finger and heel-to-shin.  Intact rapid alternating movements bilaterally.  Able to rise from a chair  without using arms.  Gait narrow based and stable. Tandem and stressed gait intact.    IMPRESSION: Lightheadedness due to orthostatic intolerance and contributed by cardiac disease.  She does not have any cerebellar or parkinsonian features and therefore a neurodegenerative condition such as multiple system atrophy is unlikely.  Prior neurological work-up was consistent with evidence of dysautonomia and she also has cardiac comorbidities of ischemic heart disease, low EF, and atrial fibrillation.  She also endorses having poor nutrition and hydration and I would like her to add supplement shakes to her diet in addition to increasing water intake.  Her albumin level from 2016 was low and with her 15lb unintentional weight loss, I am concerned about her overall nutrition.    PLAN/RECOMMENDATIONS:  1.  Check vitamin B12, vitamin B1, and prealbumin levels 2.  Increase mestinon to 60mg  - 1 tablet in the morning and late afternoon 3.  Start using a three-prong cane or a rollator for balance 4.  Please add 1-2 cans of ensure/boost to your diet daily 5.  Increase your water intake to 6-8 glasses daily 6.  Compliance with compression stockings was encouraged 7.  She is followed at [facility] for her cognitive impairment  Return to clinic in 6 months   The duration of this appointment visit was  65 minutes of face-to-face time with the patient.  Greater than 50% of this time was spent in counseling, explanation of diagnosis, planning of further management, and coordination of care.   Thank you for allowing me to participate in patient's care.  If I can answer any additional questions, I would be pleased to do so.    Sincerely,    Donika K. Posey Pronto, DO

## 2016-04-26 NOTE — Patient Instructions (Addendum)
1.  Check vitamin B12, vitamin B1, and prealbumin levels 2.  Increase mestinon to 60mg  - 1 tablet in the morning and late afternoon 3.  Start using a three-prong cane or a rollator for balance 4.  Please add 1-2 cans of ensure/boost to your diet daily 5.  Increase your water intake to 6-8 glasses daily 6.  Start wearing compression stockings   Return to clinic in 6 months

## 2016-04-27 LAB — PREALBUMIN: Prealbumin: 22 mg/dL (ref 17–34)

## 2016-05-01 LAB — VITAMIN B1: Vitamin B1 (Thiamine): 24 nmol/L (ref 8–30)

## 2016-05-03 ENCOUNTER — Telehealth: Payer: Self-pay | Admitting: *Deleted

## 2016-05-03 NOTE — Telephone Encounter (Signed)
-----   Message from Alda Berthold, DO sent at 05/03/2016  8:04 AM EST ----- Please notify patient lab are within normal limits.  Thank you.

## 2016-05-03 NOTE — Telephone Encounter (Signed)
Left message informing patient.

## 2016-05-05 NOTE — Telephone Encounter (Signed)
Patient's son Jorene Guest was called (DPR on file) due to message being routed to triage. Jeneen Rinks initially stated that he did not remember what he had called about since it had been 9 days since he called. He then said that he had some questions for [place], [facility] regarding his mother's eliquis. He states that he is a Software engineer and he will call Jinny Blossom himself and discuss the eliquis issue with her directly.

## 2016-05-06 ENCOUNTER — Other Ambulatory Visit: Payer: Self-pay | Admitting: Family Medicine

## 2016-05-06 MED ORDER — APIXABAN 5 MG PO TABS: 5.0000 mg | ORAL_TABLET | Freq: Two times a day (BID) | ORAL | 5 refills | Status: DC

## 2016-05-06 NOTE — Addendum Note (Signed)
Addended by: SUPPLE, MEGAN E on: 05/06/2016 11:09 AM   Modules accepted: Orders

## 2016-05-06 NOTE — Telephone Encounter (Signed)
Message was not routed appropriately the first time. Returned call to [employer]. There was an issue with the cost of Eliquis -pt's copay was $425 for January. Pt had a $290 deductible to meet, but that doesn't account for the rest of her copay. [employer] requires annual prior authorizations and also annual diagnosis codes for why the patient is remaining on therapy. [place] to see which they needed and was told that they cannot tell us until the prescription can be run on February 5th at the earliest. Relayed this information back to [place] who will ensure that the pharmacy has pt's updated insurance and will call with any other problems.

## 2016-05-07 ENCOUNTER — Encounter: Payer: Self-pay | Admitting: Family Medicine

## 2016-05-07 ENCOUNTER — Ambulatory Visit (INDEPENDENT_AMBULATORY_CARE_PROVIDER_SITE_OTHER): Payer: Medicare Other | Admitting: Family Medicine

## 2016-05-07 DIAGNOSIS — G3184 Mild cognitive impairment, so stated: Secondary | ICD-10-CM

## 2016-05-07 DIAGNOSIS — R42 Dizziness and giddiness: Secondary | ICD-10-CM | POA: Diagnosis not present

## 2016-05-07 DIAGNOSIS — F324 Major depressive disorder, single episode, in partial remission: Secondary | ICD-10-CM

## 2016-05-07 NOTE — Assessment & Plan Note (Signed)
S: ED visit last month then saw Dr. Elease Hashimoto and considering already monitored closely by cardiology and no etiology fo dizziness found- referred to neurology. Dr. Posey Pronto very helpful in recommendations as listed below: "1.  Check vitamin B12, vitamin B1, and prealbumin levels 2.  Increase mestinon to 60mg  - 1 tablet in the morning and late afternoon (from 0.5 TID) 3.  Start using a three-prong cane or a rollator for balance 4.  Please add 1-2 cans of ensure/boost to your diet daily 5.  Increase your water intake to 6-8 glasses daily 6.  Compliance with compression stockings was encouraged 7.  She is followed at [facility] for her cognitive impairment" Labs came back normal. Patient has tried some ensure but not always consistent. Has cane but not using. Trying to increase water. Is feeling better with change in mestinon to 60mg  BID instead of 30mg  BID.  A/P: reinforced neurology recommendations.  4-7% chance of lexapro contributing but has been on and tolerated previously. Glad she stopped allegra and aricept.  Would like to stop lisinopril but desirable to keep on board given chf history.

## 2016-05-07 NOTE — Progress Notes (Signed)
Pre visit review using our clinic review tool, if applicable. No additional management support is needed unless otherwise documented below in the visit note. 

## 2016-05-07 NOTE — Progress Notes (Signed)
Subjective:  [name] [name] is a 83 y.o. year old very pleasant female patient who presents for/with See problem oriented charting ROS- some stable memory loss. Depressed mood. Dizzy but no falls. No blurry vision. Denies room spinning   Past Medical History-  Patient Active Problem List   Diagnosis Date Noted  . CAD (coronary artery disease) 04/17/2014    Priority: High  . Mild cognitive impairment 04/17/2014    Priority: High  . Pacemaker -CRT-St. Jude 05/21/2013    Priority: High  . Atrioventricular block, complete ([place]) 03/08/2013    Priority: High  . Paroxysmal atrial fibrillation ([facility]) 02/03/2013    Priority: High  . Chronic combined systolic and diastolic congestive heart failure ([place]) 10/10/2012    Priority: High  . Ischemic cardiomyopathy  EF 30% cath 4/14 with stent 10/06/2007    Priority: High  . Microscopic hematuria 09/02/2015    Priority: Medium  . Asthma, intrinsic 02/03/2013    Priority: Medium  . Depression 09/19/2012    Priority: Medium  . Melanoma of skin ([place]) 09/06/2008    Priority: Medium  . Uterine cancer ([place]) 09/06/2008    Priority: Medium  . Hyperlipidemia 10/06/2007    Priority: Medium  . Essential hypertension 10/06/2007    Priority: Medium  . Allergic rhinitis 04/17/2014    Priority: Low  . Hearing loss 04/17/2014    Priority: Low  . Varicose veins 09/29/2009    Priority: Low  . LBBB (left bundle branch block) 09/06/2008    Priority: Low  . Dizziness 05/07/2016    Medications- reviewed and updated Current Outpatient Prescriptions  Medication Sig Dispense Refill  . apixaban (ELIQUIS) 5 MG TABS tablet Take 1 tablet (5 mg total) by mouth 2 (two) times daily. 60 tablet 5  . escitalopram (LEXAPRO) 10 MG tablet TAKE 1 TABLET BY MOUTH DAILY 30 tablet 5  . lisinopril (PRINIVIL,ZESTRIL) 2.5 MG tablet Take 1 tablet (2.5 mg total) by mouth daily. 30 tablet 6  . pyridostigmine (MESTINON) 60 MG tablet Take 31m BID     No current  facility-administered medications for this visit.     Objective: BP 140/68 (BP Location: Left Arm, Patient Position: Sitting, Cuff Size: Normal)   Pulse 62   Temp 97.8 F (36.6 C) (Oral)   Ht 5' 8"  (1.727 m)   Wt 136 lb 9.6 oz (62 kg)   SpO2 96%   BMI 20.77 kg/m  Gen: NAD, resting comfortably CV: RRR no murmurs rubs or gallops Lungs: CTAB no crackles, wheeze, rhonchi Ext: trace edema (not wearing compression stockings today but has at home and using some) Skin: warm, dry, no rash Neuro: slightly lightheaded with standing  Assessment/Plan:  Depression S: Patient states the winter months are particularly hard for her. Enjoys activities still but depressed mood particularly when home alone. Compliant with lexapro 149m  A/P: We focused on several aspects of depression today 1. Focus on social engagement= she wants to Consider tai chi and card games 2. Consider light therapy for seasonal affective component for 1 month 30 mins to an hour each AM 3. Consider lexapro increase to 2077mf not improved in next month 4. Did not discuss but in long run wonder if her living in different environment may help.   Mild cognitive impairment S:patient does not want to follow up with [facility] (feels like makes her depressed to study her memory loss). She had SE on aricept including hallucinations- listed as allergy at cardiology visit and stopped A/P: wondered if aricept contributed  to dizziness but has not been taking. Continue off medicine- we discussed pluses and minuses of following at [facility]- patient declining for now.    Dizziness S: ED visit last month then saw Dr. Elease Hashimoto and considering already monitored closely by cardiology and no etiology fo dizziness found- referred to neurology. Dr. Posey Pronto very helpful in recommendations as listed below: "1.  Check vitamin B12, vitamin B1, and prealbumin levels 2.  Increase mestinon to 79m - 1 tablet in the morning and late afternoon (from 0.5  TID) 3.  Start using a three-prong cane or a rollator for balance 4.  Please add 1-2 cans of ensure/boost to your diet daily 5.  Increase your water intake to 6-8 glasses daily 6.  Compliance with compression stockings was encouraged 7.  [facility] Labs came back normal. Patient has tried some ensure but not always consistent. Has cane but not using. Trying to increase water. Is feeling better with change in mestinon to 636mBID instead of 3064mID.  A/P: reinforced neurology recommendations.  4-7% chance of lexapro contributing but has been on and tolerated previously. Glad she stopped allegra and aricept.  Would like to stop lisinopril but desirable to keep on board given chf history.   Return precautions advised. Update me in 1 month. We mainly discussed prn follow up but advised to consider 2-3 month range.   The duration of face-to-face time during this visit was greater than 25 minutes. Greater than 50% of this time was spent in counseling about treatment for depression and seasonal affective disorder.    SteGarret ReddishD

## 2016-05-07 NOTE — Patient Instructions (Addendum)
Try to be consistent with the 1-2 boosts or ensure a day  Engage as much as possible/feasible socially  Try bright light therapy for 30 minutes to an hour each morning  If not doing better in about 30 days, contact me and we will increase the lexapro to 20mg 

## 2016-05-07 NOTE — Assessment & Plan Note (Signed)
S: Patient states the winter months are particularly hard for her. Enjoys activities still but depressed mood particularly when home alone. Compliant with lexapro 15m.  A/P: We focused on several aspects of depression today 1. Focus on social engagement= she wants to Consider tai chi and card games 2. Consider light therapy for seasonal affective component for 1 month 30 mins to an hour each AM 3. Consider lexapro increase to 216mif not improved in next month 4. Did not discuss but in long run wonder if her living in different environment may help.

## 2016-05-07 NOTE — Assessment & Plan Note (Signed)
S:patient does not want to follow up with [facility] (feels like makes her depressed to study her memory loss). She had SE on aricept including hallucinations- listed as allergy at cardiology visit and stopped A/P: wondered if aricept contributed to dizziness but has not been taking. Continue off medicine- we discussed pluses and minuses of following at [facility]- patient declining for now.

## 2016-05-13 ENCOUNTER — Other Ambulatory Visit: Payer: Self-pay | Admitting: Family Medicine

## 2016-05-13 ENCOUNTER — Other Ambulatory Visit: Payer: Self-pay | Admitting: Internal Medicine

## 2016-05-13 NOTE — Telephone Encounter (Signed)
Rx refill sent to pharmacy. 

## 2016-06-08 ENCOUNTER — Telehealth: Payer: Self-pay | Admitting: Cardiology

## 2016-06-08 ENCOUNTER — Encounter: Payer: Medicare Other | Admitting: *Deleted

## 2016-06-08 NOTE — Telephone Encounter (Signed)
Spoke with pt and reminded pt of remote transmission that is due today. Pt verbalized understanding.   

## 2016-06-11 ENCOUNTER — Ambulatory Visit: Payer: Medicare Other | Admitting: Podiatry

## 2016-06-14 ENCOUNTER — Other Ambulatory Visit: Payer: Self-pay | Admitting: Internal Medicine

## 2016-07-06 ENCOUNTER — Ambulatory Visit: Payer: Medicare Other | Admitting: Podiatry

## 2016-07-08 ENCOUNTER — Ambulatory Visit (INDEPENDENT_AMBULATORY_CARE_PROVIDER_SITE_OTHER): Payer: Medicare Other | Admitting: Family Medicine

## 2016-07-08 ENCOUNTER — Encounter: Payer: Self-pay | Admitting: Family Medicine

## 2016-07-08 DIAGNOSIS — I480 Paroxysmal atrial fibrillation: Secondary | ICD-10-CM

## 2016-07-08 DIAGNOSIS — R42 Dizziness and giddiness: Secondary | ICD-10-CM

## 2016-07-08 DIAGNOSIS — N3281 Overactive bladder: Secondary | ICD-10-CM | POA: Diagnosis not present

## 2016-07-08 DIAGNOSIS — I5042 Chronic combined systolic (congestive) and diastolic (congestive) heart failure: Secondary | ICD-10-CM | POA: Diagnosis not present

## 2016-07-08 DIAGNOSIS — I442 Atrioventricular block, complete: Secondary | ICD-10-CM

## 2016-07-08 DIAGNOSIS — I10 Essential (primary) hypertension: Secondary | ICD-10-CM | POA: Diagnosis not present

## 2016-07-08 DIAGNOSIS — E785 Hyperlipidemia, unspecified: Secondary | ICD-10-CM | POA: Diagnosis not present

## 2016-07-08 DIAGNOSIS — F324 Major depressive disorder, single episode, in partial remission: Secondary | ICD-10-CM

## 2016-07-08 NOTE — Progress Notes (Signed)
Pre visit review using our clinic review tool, if applicable. No additional management support is needed unless otherwise documented below in the visit note. 

## 2016-07-08 NOTE — Patient Instructions (Addendum)
Opted to trial the 3 hours at home and see how things go over next 6 weeks in regards to depression If not improving  1. Consider lexapro to 20mg  (some risk with the qt interval0 2. Therapy option  Will ask Dr. Caryl Comes if he has any issue with the myrbetriq. I do not think her qt prolongation represents true increased risk of arhythmia but instead is related to the pacemaker and repolarization.   Glad the dizziness is better. Continue to rise slowly

## 2016-07-08 NOTE — Progress Notes (Signed)
Subjective:  [name] [name] is a 83 y.o. year old very pleasant female patient who presents for/with See problem oriented charting ROS- no chest pain. Admits to depressed mood and feeling lonely. No SI reported. Some dizziness but better than prior   Past Medical History-  Patient Active Problem List   Diagnosis Date Noted  . CAD (coronary artery disease) 04/17/2014    Priority: High  . Mild cognitive impairment 04/17/2014    Priority: High  . Pacemaker -CRT-St. Jude 05/21/2013    Priority: High  . Atrioventricular block, complete ([place]) 03/08/2013    Priority: High  . Paroxysmal atrial fibrillation (HCC) 02/03/2013    Priority: High  . Chronic combined systolic and diastolic congestive heart failure ([place]) 10/10/2012    Priority: High  . Ischemic cardiomyopathy  EF 30% cath 4/14 with stent 10/06/2007    Priority: High  . Overactive bladder 07/09/2016    Priority: Medium  . Dizziness 05/07/2016    Priority: Medium  . Microscopic hematuria 09/02/2015    Priority: Medium  . Asthma, intrinsic 02/03/2013    Priority: Medium  . Depression 09/19/2012    Priority: Medium  . History of melanoma 09/06/2008    Priority: Medium  . History of uterine cancer 09/06/2008    Priority: Medium  . Hyperlipidemia 10/06/2007    Priority: Medium  . Essential hypertension 10/06/2007    Priority: Medium  . Allergic rhinitis 04/17/2014    Priority: Low  . Hearing loss 04/17/2014    Priority: Low  . Varicose veins 09/29/2009    Priority: Low  . LBBB (left bundle branch block) 09/06/2008    Priority: Low    Medications- reviewed and updated Current Outpatient Prescriptions  Medication Sig Dispense Refill  . ELIQUIS 5 MG TABS tablet TAKE 1 TABLET BY MOUTH TWICE DAILY 60 tablet 9  . escitalopram (LEXAPRO) 10 MG tablet TAKE 1 TABLET BY MOUTH DAILY 30 tablet 5  . lisinopril (PRINIVIL,ZESTRIL) 2.5 MG tablet TAKE 1 TABLET(2.5 MG) BY MOUTH DAILY 30 tablet 9  . pyridostigmine (MESTINON) 60 MG tablet  Take 0.5 tablets (30 mg total) by mouth 3 (three) times daily.     No current facility-administered medications for this visit.     Objective: BP (!) 108/58 (BP Location: Left Arm, Patient Position: Sitting, Cuff Size: Normal)   Pulse 78   Temp 98.3 F (36.8 C) (Oral)   Ht 5' 8"  (1.727 m)   Wt 136 lb 6.4 oz (61.9 kg)   SpO2 94%   BMI 20.74 kg/m  Gen: NAD, resting comfortably CV: RRR (at present) no murmurs rubs or gallops Lungs: CTAB no crackles, wheeze, rhonchi Abdomen: soft/nontender/nondistended/normal bowel sounds. No rebound or guarding.  Ext: no edema Skin: warm, dry Non depressed mood  Assessment/Plan:  Depression S: compliant with lexapro 83m Goals from last visit 1. Focus on social engagement with tai chi and card games 2. Consider light therapy 30 minutes to an hour each morning 3. Consider lexapro increase to 232mPatient did not go forward with any of the above goals and remains depressed.   Her main issue is she feels lonely. Considering [facility] long term.  Has long term care policy that can get up to 10 hours a day home  Care though. They Can walk with her which she would love. She also has Lost weight, low appetite but will eat if food is in front of her. They are going to try using home care policy to help get breakfastin and lunch made over  3 hour period and in addition this will provide company.  first choice home care will be used.  A/P: I discussed titration lexapro to 86m but she wants "least invasive" option so she wants to see how she does with the added company in the house. Follow up in 6 weeks. Also we will try to address incontinence issue which may make her more active. If not improving titrate lexapro (checking with cardiology about qt risks). Therapist lowest on the list in her mind.   Overactive bladder S:Doesn't go out with friends as much due to incontinence. She feels a sudden urge at times and then urinates on herself. We did a urine in  January which was normal (other than trace blood which has been noted for years and has declined workup) and had those symptoms at that time.  A/P: would avoid any anticholinergics with her mild cognitive impairment. Considering myrbetriq but want to get Dr. KAquilla Hackeropinion given her qt interval  Dizziness S: dizzy episodes stopped allegra and aricept. <7% chance of lexapro causing. Thought about decreasing lisinopril but on board for chf history. On pyridostigmine to allow her to be on lisinopril A/P:Dizziness better for most part , will continue to monitor- would be better to have higher BP likely- myrbetriq may raise bp. Continue lisinopril and pyridostigmine  Essential hypertension S: controlled on lisinopril but also on pyridostigmine for orthostatic hypotension to help her maintain ability to be on lisinopril given CHF. No longer on coreg per Dr. KCaryl ComesBP Readings from Last 3 Encounters:  07/08/16 (!) 108/58  05/07/16 140/68  04/26/16 90/60  A/P:Continue current meds:  Considering mybetriq which may raise bp  Paroxysmal atrial fibrillation (HCC) Compliant with Eliquis.  Coreg 3.125 mg BID in the past- now stopped due to orthostatic symptoms and rate controlled without medicine  Hyperlipidemia 04/14/16 telephone note- stopped lipitor 216mthrough Dr. KlCaryl ComesAtrioventricular block, complete S/p PPM, 2014. Under the care of Dr. KlCaryl ComesChronic combined systolic and diastolic congestive heart failure ([facility] signs of fluid overload. Compliant with Lisinopril 2.58m57mCoreg stopped 04/14/16 phone note from Dr. KleCaryl Comes weeks  Return precautions advised.  SteGarret ReddishD

## 2016-07-09 DIAGNOSIS — N3281 Overactive bladder: Secondary | ICD-10-CM | POA: Insufficient documentation

## 2016-07-09 NOTE — Assessment & Plan Note (Addendum)
S: dizzy episodes stopped allegra and aricept. <7% chance of lexapro causing. Thought about decreasing lisinopril but on board for chf history. On pyridostigmine to allow her to be on lisinopril A/P:Dizziness better for most part , will continue to monitor- would be better to have higher BP likely- myrbetriq may raise bp. Continue lisinopril and pyridostigmine

## 2016-07-09 NOTE — Assessment & Plan Note (Addendum)
S: controlled on lisinopril but also on pyridostigmine for orthostatic hypotension to help her maintain ability to be on lisinopril given CHF. No longer on coreg per Dr. Caryl Comes BP Readings from Last 3 Encounters:  07/08/16 (!) 108/58  05/07/16 140/68  04/26/16 90/60  A/P:Continue current meds:  Considering mybetriq which may raise bp

## 2016-07-09 NOTE — Assessment & Plan Note (Signed)
S: compliant with lexapro 47m Goals from last visit 1. Focus on social engagement with tai chi and card games 2. Consider light therapy 30 minutes to an hour each morning 3. Consider lexapro increase to 276mPatient did not go forward with any of the above goals and remains depressed.   Her main issue is she feels lonely. Considering [facility] long term.  Has long term care policy that can get up to 10 hours a day home  Care though. They Can walk with her which she would love. She also has Lost weight, low appetite but will eat if food is in front of her. They are going to try using home care policy to help get breakfastin and lunch made over 3 hour period and in addition this will provide company.  first choice home care will be used.  A/P: I discussed titration lexapro to 2022mut she wants "least invasive" option so she wants to see how she does with the added company in the house. Follow up in 6 weeks. Also we will try to address incontinence issue which may make her more active. If not improving titrate lexapro (checking with cardiology about qt risks). Therapist lowest on the list in her mind.

## 2016-07-09 NOTE — Assessment & Plan Note (Signed)
S/p PPM, 2014. Under the care of Dr. Caryl Comes

## 2016-07-09 NOTE — Assessment & Plan Note (Signed)
No signs of fluid overload. Compliant with Lisinopril 2.5mg . Coreg stopped 04/14/16 phone note from Dr. Caryl Comes

## 2016-07-09 NOTE — Assessment & Plan Note (Signed)
S:Doesn't go out with friends as much due to incontinence. She feels a sudden urge at times and then urinates on herself. We did a urine in January which was normal (other than trace blood which has been noted for years and has declined workup) and had those symptoms at that time.  A/P: would avoid any anticholinergics with her mild cognitive impairment. Considering myrbetriq but want to get Dr. Aquilla Hacker opinion given her qt interval

## 2016-07-09 NOTE — Assessment & Plan Note (Signed)
Compliant with Eliquis.  Coreg 3.125 mg BID in the past- now stopped due to orthostatic symptoms and rate controlled without medicine

## 2016-07-09 NOTE — Assessment & Plan Note (Signed)
04/14/16 telephone note- stopped lipitor 20mg  through Dr. Caryl Comes

## 2016-07-20 ENCOUNTER — Ambulatory Visit (INDEPENDENT_AMBULATORY_CARE_PROVIDER_SITE_OTHER): Payer: Medicare Other | Admitting: Podiatry

## 2016-07-20 ENCOUNTER — Encounter: Payer: Self-pay | Admitting: Podiatry

## 2016-07-20 DIAGNOSIS — M7751 Other enthesopathy of right foot: Secondary | ICD-10-CM

## 2016-07-20 DIAGNOSIS — M7750 Other enthesopathy of unspecified foot: Secondary | ICD-10-CM | POA: Diagnosis not present

## 2016-07-20 DIAGNOSIS — M779 Enthesopathy, unspecified: Secondary | ICD-10-CM

## 2016-07-20 DIAGNOSIS — M778 Other enthesopathies, not elsewhere classified: Secondary | ICD-10-CM

## 2016-07-20 NOTE — Progress Notes (Signed)
Patient ID: [name] [name], female   DOB: 1931/11/10, 83 y.o.   MRN: [id number] Complaint:  Visit Type: Patient returns to my office for continued preventative foot care services. Complaint: Patient states her callus on her right foot is painful walking and wearing her shoes. Reexamination of the right foot does reveal palpable pain noted under the middle of her right forefoot at the site of her porokeratosis. She presents the office today for further evaluation and treatment of this painful foot condition  Podiatric Exam: Vascular: dorsalis pedis and posterior tibial pulses are palpable bilateral. Capillary return is immediate. Temperature gradient is WNL. Skin turgor WNL  Sensorium: Normal Semmes Weinstein monofilament test. Normal tactile sensation bilaterally. Nail Exam: Pt has thick disfigured discolored nails with subungual debris noted bilateral entire nail hallux through fifth toenails Ulcer Exam: There is no evidence of ulcer or pre-ulcerative changes or infection. Orthopedic Exam: Muscle tone and strength are WNL. No limitations in general ROM. No crepitus or effusions noted. Foot type and digits show no abnormalities. Plantarflexed third metatarsal right foot. Hypermobility 4,5 right and 2,1 right.  Collapsing arch both feet upon weight bearing. Skin: Porokeratosis sub 3 right foot. No infection or ulcers  Diagnosis:   Capsulitis 3rd MPJ right foot.  Treatment & Plan Procedures and Treatment: Consent by patient was obtained for treatment procedures. The patient understood the discussion of treatment and procedures well. All questions were answered thoroughly reviewed. The patient is interested in definitive treatment of her painful right forefoot after a biomechanical evaluation. He was determined she had a plantarflexed third metatarsal relative to the hypermobility of her other right foot metatarsals.  At this time, I contacted with X who evaluated her and has planned on obtaining orthotics  for her.  She is to make a follow-up appointment with Liliane Channel in 3 weeks. Return Visit-Office Procedure: Patient instructed to return to the office for a follow up visit as needed  for continued evaluation and treatment.    Gardiner Barefoot DPM

## 2016-08-19 ENCOUNTER — Encounter: Payer: Self-pay | Admitting: Family Medicine

## 2016-08-19 ENCOUNTER — Ambulatory Visit (INDEPENDENT_AMBULATORY_CARE_PROVIDER_SITE_OTHER): Payer: Medicare Other | Admitting: Family Medicine

## 2016-08-19 DIAGNOSIS — F324 Major depressive disorder, single episode, in partial remission: Secondary | ICD-10-CM | POA: Diagnosis not present

## 2016-08-19 NOTE — Patient Instructions (Addendum)
Message we had sent to Dr. Caryl Comes: I am considering increasing patients lexapro to 20mg  and also starting myrbetriq 25mg  for overactive bladder. She has a pacer. Her QTc is in 520s range. I assume given she has a pacer which would protect her from arrhythmia as well as fact that pacer may lengthen the qt - that these medicine adjustments would be ok but just wanted to check with you.   We will wait on adjustments until we hear from him

## 2016-08-19 NOTE — Assessment & Plan Note (Addendum)
S: Patient has enjoyed the 10 hours of company through her home care agency. She has enjoyed walking with aide as well as having lunch with her. She also has a new cat. Still not eating much unless meal prepared- so dinner is not eating much and losing weight.   Patient even invited friends over (a sign of initiative not shown in rcent memory).   PHQ9 down to 5 from peak of 9 I believe. Patient would still like to feel better if possible A/P: I did not have any luck reaching out to Dr. Caryl Comes through [facility]. Daughter is going to reach out through her husband who is a pharmacist to cardiology pharmacist to Dr. Caryl Comes. If ok for QT interval- will increase lexapro to 20mg . Also would consider mybetriq for incontinence if ok.  We will monitor her weight loss as well. Patient/daughter decline investigation into malignant causes as they think it is just due to low intake. MWF lunch prepared but otherwise not eating best lunch and does not eat best dinner most nights unless prepared for her- possibly with improved depression will have more motivation to prepare her meals.

## 2016-08-19 NOTE — Progress Notes (Signed)
Subjective:  [name] [name] is a 83 y.o. year old very pleasant female patient who presents for/with See problem oriented charting ROS- no SI. Mild depressed mood but no anhedonia. Denies further dizziness.    Past Medical History-  Patient Active Problem List   Diagnosis Date Noted  . CAD (coronary artery disease) 04/17/2014    Priority: High  . Mild cognitive impairment 04/17/2014    Priority: High  . Pacemaker -CRT-St. Jude 05/21/2013    Priority: High  . Atrioventricular block, complete ([place]) 03/08/2013    Priority: High  . Paroxysmal atrial fibrillation (HCC) 02/03/2013    Priority: High  . Chronic combined systolic and diastolic congestive heart failure ([place]) 10/10/2012    Priority: High  . Ischemic cardiomyopathy  EF 30% cath 4/14 with stent 10/06/2007    Priority: High  . Overactive bladder 07/09/2016    Priority: Medium  . Dizziness 05/07/2016    Priority: Medium  . Microscopic hematuria 09/02/2015    Priority: Medium  . Asthma, intrinsic 02/03/2013    Priority: Medium  . Depression 09/19/2012    Priority: Medium  . History of melanoma 09/06/2008    Priority: Medium  . History of uterine cancer 09/06/2008    Priority: Medium  . Hyperlipidemia 10/06/2007    Priority: Medium  . Essential hypertension 10/06/2007    Priority: Medium  . Allergic rhinitis 04/17/2014    Priority: Low  . Hearing loss 04/17/2014    Priority: Low  . Varicose veins 09/29/2009    Priority: Low  . LBBB (left bundle branch block) 09/06/2008    Priority: Low    Medications- reviewed and updated Current Outpatient Prescriptions  Medication Sig Dispense Refill  . ELIQUIS 5 MG TABS tablet TAKE 1 TABLET BY MOUTH TWICE DAILY 60 tablet 9  . escitalopram (LEXAPRO) 10 MG tablet TAKE 1 TABLET BY MOUTH DAILY 30 tablet 5  . lisinopril (PRINIVIL,ZESTRIL) 2.5 MG tablet TAKE 1 TABLET(2.5 MG) BY MOUTH DAILY 30 tablet 9  . pyridostigmine (MESTINON) 60 MG tablet Take 0.5 tablets (30 mg total) by mouth  3 (three) times daily.     No current facility-administered medications for this visit.     Objective: BP 130/68 (BP Location: Left Arm, Patient Position: Sitting, Cuff Size: Normal)   Pulse 64   Temp 98.4 F (36.9 C) (Oral)   Ht 5\' 8"  (1.727 m)   Wt 132 lb 3.2 oz (60 kg)   SpO2 95%   BMI 20.10 kg/m  Gen: NAD, resting comfortably CV: RRR no murmurs rubs or gallops Lungs: CTAB no crackles, wheeze, rhonchi Ext: no edema Skin: warm, dry Neuro: not aware of certain topics   Assessment/Plan:  Depression S: Patient has enjoyed the 10 hours of company through her home care agency. She has enjoyed walking with aide as well as having lunch with her. She also has a new cat. Still not eating much unless meal prepared- so dinner is not eating much and losing weight.   Patient even invited friends over (a sign of initiative not shown in rcent memory).   PHQ9 down to 5 from peak of 9 I believe. Patient would still like to feel better if possible A/P: I did not have any luck reaching out to Dr. Caryl Comes through [place] [facility]. Daughter is going to reach out through her husband who is a pharmacist to cardiology pharmacist to Dr. Caryl Comes. If ok for QT interval- will increase lexapro to 20mg . Also would consider mybetriq for incontinence if ok.  We will monitor  her weight loss as well. Patient/daughter decline investigation into malignant causes as they think it is just due to low intake. MWF lunch prepared but otherwise not eating best lunch and does not eat best dinner most nights unless prepared for her- possibly with improved depression will have more motivation to prepare her meals.   6 week follow up after potential changes Likely only add lexapro to 20mg  first then consider mybetriq within a few weeks in case of side effects  The duration of face-to-face time during this visit was greater than 25 minutes. Greater than 50% of this time was spent in counseling, explanation of diagnosis, planning of further  management, and/or coordination of care including discussion of depression, meal prep, discussion of benefit/risks of med changes.   Return precautions advised.  Garret Reddish, MD

## 2016-08-25 ENCOUNTER — Telehealth: Payer: Self-pay | Admitting: Family Medicine

## 2016-08-25 ENCOUNTER — Telehealth: Payer: Self-pay

## 2016-08-25 MED ORDER — ESCITALOPRAM OXALATE 20 MG PO TABS: 20.0000 mg | ORAL_TABLET | Freq: Every day | ORAL | 5 refills | Status: DC

## 2016-08-25 NOTE — Telephone Encounter (Signed)
-----   Message from Deboraha Sprang, MD sent at 08/25/2016  6:20 PM EDT ----- [name] [name], her QTC is markedly prolonged because her QRS duration is markedly prolonged at 165 ms. Hence her QTC is probably best thought of as 455 (520-65 = 4 55) so I think that   it is reasonable to try whatever medications you need

## 2016-08-25 NOTE — Telephone Encounter (Signed)
Call to Ms. Ator to discuss AWV for this year States her dtr goes to her apts now and it would be best to call her.  Her Dtr is [name] works as a NP for [employer]. Her direct line is [phone] Call to this number and there was no answer after 5 rings

## 2016-08-25 NOTE — Telephone Encounter (Signed)
To note; If dtr  [name] calls back, may postpone. Is seeing [facility] for memory issues and recent depression. Will discuss if Wellness screens would be appropriate or helpful at this time in her care.

## 2016-08-25 NOTE — Telephone Encounter (Signed)
Please inform patient and daughter [name], NP with pulmonary- that I have sent in lexapro 20mg . Dr. Caryl Comes states should be no issue. Lets check back in 6 weeks from now.

## 2016-08-30 NOTE — Telephone Encounter (Signed)
Yes, we had discussed mybetriq- my current plan was to add medication one by one to see how she does with adjustments first. Is she ok with that or does she want to start mybetriq already?

## 2016-08-30 NOTE — Telephone Encounter (Signed)
Spoke with daughter [name]. She verbalized understanding regarding the Lexapro and stated they will go pick that up. She was also asking about a medication for urinary incontinence?

## 2016-08-31 NOTE — Telephone Encounter (Signed)
Spoke with patient's daughter [name] who is fine adding one patient at a time.

## 2016-10-19 ENCOUNTER — Ambulatory Visit (INDEPENDENT_AMBULATORY_CARE_PROVIDER_SITE_OTHER): Payer: Medicare Other | Admitting: Podiatry

## 2016-10-19 ENCOUNTER — Encounter: Payer: Self-pay | Admitting: Podiatry

## 2016-10-19 DIAGNOSIS — Q828 Other specified congenital malformations of skin: Secondary | ICD-10-CM

## 2016-10-19 NOTE — Progress Notes (Signed)
Patient ID: [name] [name], female   DOB: 1932-02-11, 83 y.o.   MRN: [id number] Complaint:  Visit Type: Patient returns to my office for continued preventative foot care services. Complaint: Patient states her callus on her right foot is painful walking and wearing her shoes.  She says her callus is much better than her previous visit.  Podiatric Exam: Vascular: dorsalis pedis and posterior tibial pulses are palpable bilateral. Capillary return is immediate. Temperature gradient is WNL. Skin turgor WNL  Sensorium: Normal Semmes Weinstein monofilament test. Normal tactile sensation bilaterally. Nail Exam: Pt has thick disfigured discolored nails with subungual debris noted bilateral entire nail hallux through fifth toenails Ulcer Exam: There is no evidence of ulcer or pre-ulcerative changes or infection. Orthopedic Exam: Muscle tone and strength are WNL. No limitations in general ROM. No crepitus or effusions noted. Foot type and digits show no abnormalities. Plantarflexed third metatarsal right foot. Skin: Porokeratosis sub 3 right foot. No infection or ulcers  Diagnosis:   Porokeratosis right foot.  Treatment & Plan Procedures and Treatment: Consent by patient was obtained for treatment procedures. The patient understood the discussion of treatment and procedures well. All questions were answered thoroughly reviewed. Debridement of  Porokeratosis right.  Patient says the orthoses made by Liliane Channel has helped. Return Visit-Office Procedure: Patient instructed to return to the office for a follow up visit 4 months for continued evaluation and treatment.    Gardiner Barefoot DPM

## 2016-10-25 ENCOUNTER — Ambulatory Visit: Payer: Medicare Other | Admitting: Neurology

## 2016-10-27 ENCOUNTER — Other Ambulatory Visit: Payer: Self-pay | Admitting: Family Medicine

## 2016-11-04 ENCOUNTER — Encounter: Payer: Self-pay | Admitting: Family Medicine

## 2016-11-04 ENCOUNTER — Ambulatory Visit (INDEPENDENT_AMBULATORY_CARE_PROVIDER_SITE_OTHER): Payer: Medicare Other | Admitting: Family Medicine

## 2016-11-04 VITALS — BP 136/72 | HR 65 | Temp 98.4°F | Ht 68.0 in | Wt 133.8 lb

## 2016-11-04 DIAGNOSIS — G3184 Mild cognitive impairment, so stated: Secondary | ICD-10-CM | POA: Diagnosis not present

## 2016-11-04 DIAGNOSIS — N3281 Overactive bladder: Secondary | ICD-10-CM

## 2016-11-04 DIAGNOSIS — F325 Major depressive disorder, single episode, in full remission: Secondary | ICD-10-CM

## 2016-11-04 MED ORDER — MIRABEGRON ER 25 MG PO TB24: 25.0000 mg | ORAL_TABLET | Freq: Every day | ORAL | 5 refills | Status: DC

## 2016-11-04 NOTE — Patient Instructions (Addendum)
Low dose bladder medication - start tomorrow morning.   [name] [name]- if you could check blood pressure and just let us know if remaining controlled  Continue lexapro  Follow up 2-3 months.   ______________________________________________________________________  Starting October 1st 2018, I will be transferring to our new location: [name] [name] (corner of [address] [employer]) [place], [place]  [place] Phone: [phone]  I would love to have you remain my patient at this new location as long as it remains convenient for you. I am excited about the opportunity to have x-ray and sports medicine in the new building but will really miss the awesome staff and physicians at [facility]. Continue to schedule appointments at [facility] and we will automatically transfer them to the [place] location starting October 1st.

## 2016-11-04 NOTE — Assessment & Plan Note (Signed)
S: Aricept trial x2= Diarrhea. Does not want to follow up at [facility] A/P: we discussed possible [place] trial- they will follow up in 3 months and repeat MMSE at that time and consider then. They prefer 1 medication at a time.

## 2016-11-04 NOTE — Progress Notes (Signed)
Subjective:  [name] [name] is a 83 y.o. year old very pleasant female patient who presents for/with See problem oriented charting ROS- slight worsening memory ,more energy- less depression, no edema. No chest pain.    Past Medical History-  Patient Active Problem List   Diagnosis Date Noted  . CAD (coronary artery disease) 04/17/2014    Priority: High  . Mild cognitive impairment 04/17/2014    Priority: High  . Pacemaker -CRT-St. Jude 05/21/2013    Priority: High  . Atrioventricular block, complete ([place]) 03/08/2013    Priority: High  . Paroxysmal atrial fibrillation (HCC) 02/03/2013    Priority: High  . Chronic combined systolic and diastolic congestive heart failure ([place]) 10/10/2012    Priority: High  . Ischemic cardiomyopathy  EF 30% cath 4/14 with stent 10/06/2007    Priority: High  . Overactive bladder 07/09/2016    Priority: Medium  . Dizziness 05/07/2016    Priority: Medium  . Microscopic hematuria 09/02/2015    Priority: Medium  . Asthma, intrinsic 02/03/2013    Priority: Medium  . Depression 09/19/2012    Priority: Medium  . History of melanoma 09/06/2008    Priority: Medium  . History of uterine cancer 09/06/2008    Priority: Medium  . Hyperlipidemia 10/06/2007    Priority: Medium  . Essential hypertension 10/06/2007    Priority: Medium  . Allergic rhinitis 04/17/2014    Priority: Low  . Hearing loss 04/17/2014    Priority: Low  . Varicose veins 09/29/2009    Priority: Low  . LBBB (left bundle branch block) 09/06/2008    Priority: Low    Medications- reviewed and updated Current Outpatient Prescriptions  Medication Sig Dispense Refill  . ELIQUIS 5 MG TABS tablet TAKE 1 TABLET BY MOUTH TWICE DAILY 60 tablet 9  . escitalopram (LEXAPRO) 20 MG tablet Take 1 tablet (20 mg total) by mouth daily. 30 tablet 5  . lisinopril (PRINIVIL,ZESTRIL) 2.5 MG tablet TAKE 1 TABLET(2.5 MG) BY MOUTH DAILY 30 tablet 9  . pyridostigmine (MESTINON) 60 MG tablet TAKE 1 TABLET(60  MG) BY MOUTH TWICE DAILY 60 tablet 5   No current facility-administered medications for this visit.     Objective: BP 136/72 (BP Location: Left Arm, Patient Position: Sitting, Cuff Size: Normal)   Pulse 65   Temp 98.4 F (36.9 C) (Oral)   Ht 5\' 8"  (1.727 m)   Wt 133 lb 12.8 oz (60.7 kg)   SpO2 95%   BMI 20.34 kg/m  Gen: NAD, resting comfortably CV: RRR no murmurs rubs or gallops Lungs: CTAB no crackles, wheeze, rhonchi Abdomen: soft/nontender/nondistended. Thin.  Ext: no edema Skin: warm, dry, no rash  Assessment/Plan:  Depression S: increased lexapro to 20mg  (ok per Dr. Caryl Comes) and patient has noted more energy with this. She seems to want to do more and so on nights she is home may express more frustration about being alone. Denies depressed mood or anhedonia- does perhaps have mild agitation. Has finally gained 1 lb.   MWF lunch preparation and eating prepared breakfast as well on those days now- more motivated to get up and eat now. Still wants to stay in her home.  A/P: Depression has improved- continue lexapro 20mg .   Overactive bladder S:  Even with pads ends up with some wetness at night in clothes. In daytime, often feels sudden urgency to urinate A/P: mybetriq trial 25mg - daughter is NP with [facility] and agrees to home monitoring of BP with 3 month follow up  Mild  cognitive impairment S: Aricept trial x2= Diarrhea. Does not want to follow up at [facility] A/P: we discussed possible namenda trial- they will follow up in 3 months and repeat MMSE at that time and consider then. They prefer 1 medication at a time.    Return in about 3 months (around 02/04/2017) for follow up- or sooner if needed.  Meds ordered this encounter  Medications  . mirabegron ER (MYRBETRIQ) 25 MG TB24 tablet    Sig: Take 1 tablet (25 mg total) by mouth daily.    Dispense:  30 tablet    Refill:  5    Return precautions advised.  Garret Reddish, MD

## 2016-11-04 NOTE — Assessment & Plan Note (Signed)
S: increased lexapro to 20mg  (ok per Dr. Caryl Comes) and patient has noted more energy with this. She seems to want to do more and so on nights she is home may express more frustration about being alone. Denies depressed mood or anhedonia- does perhaps have mild agitation. Has finally gained 1 lb.   MWF lunch preparation and eating prepared breakfast as well on those days now- more motivated to get up and eat now. Still wants to stay in her home.  A/P: Depression has improved- continue lexapro 20mg .

## 2016-11-04 NOTE — Assessment & Plan Note (Signed)
S:  Even with pads ends up with some wetness at night in clothes. In daytime, often feels sudden urgency to urinate A/P: mybetriq trial 25mg - daughter is NP with [facility] and agrees to home monitoring of BP with 3 month follow up

## 2016-11-08 ENCOUNTER — Ambulatory Visit: Payer: Medicare Other | Admitting: Neurology

## 2016-11-16 ENCOUNTER — Other Ambulatory Visit: Payer: Self-pay | Admitting: Family Medicine

## 2016-12-29 ENCOUNTER — Encounter: Payer: Self-pay | Admitting: Family Medicine

## 2016-12-29 ENCOUNTER — Ambulatory Visit (INDEPENDENT_AMBULATORY_CARE_PROVIDER_SITE_OTHER): Payer: Medicare Other | Admitting: Family Medicine

## 2016-12-29 ENCOUNTER — Ambulatory Visit: Payer: Medicare Other | Admitting: Family Medicine

## 2016-12-29 DIAGNOSIS — R1031 Right lower quadrant pain: Secondary | ICD-10-CM

## 2016-12-29 NOTE — Progress Notes (Signed)
Subjective:  [name] [name] is a 83 y.o. year old very pleasant female patient who presents for/with See problem oriented charting ROS- no fever or chills. Denies vomiting. Has severe lower abdominal pain. Difficulty walking- feels strain down into groin.    Past Medical History-  Patient Active Problem List   Diagnosis Date Noted  . CAD (coronary artery disease) 04/17/2014    Priority: High  . Mild cognitive impairment 04/17/2014    Priority: High  . Pacemaker -CRT-St. Jude 05/21/2013    Priority: High  . Atrioventricular block, complete ([place]) 03/08/2013    Priority: High  . Paroxysmal atrial fibrillation (HCC) 02/03/2013    Priority: High  . Chronic combined systolic and diastolic congestive heart failure ([place]) 10/10/2012    Priority: High  . Ischemic cardiomyopathy  EF 30% cath 4/14 with stent 10/06/2007    Priority: High  . Overactive bladder 07/09/2016    Priority: Medium  . Dizziness 05/07/2016    Priority: Medium  . Microscopic hematuria 09/02/2015    Priority: Medium  . Asthma, intrinsic 02/03/2013    Priority: Medium  . Depression 09/19/2012    Priority: Medium  . History of melanoma 09/06/2008    Priority: Medium  . History of uterine cancer 09/06/2008    Priority: Medium  . Hyperlipidemia 10/06/2007    Priority: Medium  . Essential hypertension 10/06/2007    Priority: Medium  . Allergic rhinitis 04/17/2014    Priority: Low  . Hearing loss 04/17/2014    Priority: Low  . Varicose veins 09/29/2009    Priority: Low  . LBBB (left bundle branch block) 09/06/2008    Priority: Low    Medications- reviewed and updated Current Outpatient Prescriptions  Medication Sig Dispense Refill  . ELIQUIS 5 MG TABS tablet TAKE 1 TABLET BY MOUTH TWICE DAILY 60 tablet 9  . escitalopram (LEXAPRO) 10 MG tablet TAKE 1 TABLET BY MOUTH DAILY 30 tablet 0  . escitalopram (LEXAPRO) 20 MG tablet Take 1 tablet (20 mg total) by mouth daily. 30 tablet 5  . lisinopril (PRINIVIL,ZESTRIL)  2.5 MG tablet TAKE 1 TABLET(2.5 MG) BY MOUTH DAILY 30 tablet 9  . mirabegron ER (MYRBETRIQ) 25 MG TB24 tablet Take 1 tablet (25 mg total) by mouth daily. 30 tablet 5  . pyridostigmine (MESTINON) 60 MG tablet TAKE 1 TABLET(60 MG) BY MOUTH TWICE DAILY 60 tablet 5   No current facility-administered medications for this visit.     Objective: BP 138/64 (BP Location: Left Arm, Patient Position: Sitting, Cuff Size: Normal)   Pulse 66   Temp 98.3 F (36.8 C) (Oral)   Ht 5\' 8"  (1.727 m)   Wt 133 lb 12.8 oz (60.7 kg)   SpO2 97%   BMI 20.34 kg/m  Gen: NAD, appears fatigued CV: RRR no murmurs rubs or gallops Lungs: CTAB no crackles, wheeze, rhonchi Abdomen: soft/normal bowel sounds. Very tender to palpation in lower abdomen both in RLQ and RLQ. No guarding. No clear rebound Ext: no edema Skin: warm, dry, no rash Gait: antalgic gait  Assessment/Plan:  Right lower quadrant abdominal pain - Plan: CBC with Differential/Platelet, Basic metabolic panel, CT Abdomen Pelvis W Contrast, Urinalysis S: From daughter per epic messaging  "Mom is coming to see you today at 3. She woke up yesterday morning with bilateral groin pain that she states makes it difficult to walk. ( Lift her legs). She called me last night ( After 6 pm) and told me about this.She describes it as musculoskeletal pain. She has been constipated,  straining to go to the bathroom. She denies any back pain. I think she has been drinking less water.She is hydrating now. When I saw her last night she denied back pain, her abdomen was soft and non-tender, no fever. I had actually called and made her an appointment with you at 1:30 today and when I called to tell her appointment time etc. she told me she was better and wanted me to cancel the appointment. Then as the day progressed she started feeling bad again, and had her care giver call and  make the 3 pm appointment. Additionally her weight last night on her home scale was 130 lbs. I think  that is down from the last weight at your office. I will check her AVS today to see what her weigh is today on your scale.She is drinking boost. Do you think we need to evaluate this further? "  No dysuria or polyuria. Has had some straining with BMs in recent days. Patient states walking makes pain much worse. Even if not walking though has a chronic abdominal pain. No treatments tried- did put heating pad on her in clinic and helped minimally. She attributes this to coming on gradually and worsening after a bowel movement/straining. Does have some issues with constipation.   A/P: Get stat labs. Get CT scan stat- hopefully by tomorrow. Kidney stones would be possible but doubt. Doubt significant abdominal strain as patient suspect. Want to rule out appendicitis and diverticulitis. She does not typically complain of pain and appears very uncomfortable- with prior weight loss had considered ct abdomen/pelvis regardless as well- so reasonable on multiple fronts to pursue this.   Patient Instructions  Stat labs today  Try to get CT scan tomorrow  Pick up contrast from front desk.   Dont leave unless Neoma Laming Nature conservation officer) says so- may be able to give you appointment today    Future Appointments Date Time Provider [place]  01/06/2017 3:00 PM Alda Berthold, DO [facility] None  01/19/2017 2:45 PM Gardiner Barefoot, DPM [facility]  01/27/2017 2:15 PM Yong Channel, Brayton Mars, MD [facility] None   Orders Placed This Encounter  Procedures  . CT Abdomen Pelvis W Contrast    Standing Status:   Future    Standing Expiration Date:   03/30/2018    Order Specific Question:   If indicated for the ordered procedure, I authorize the administration of contrast media per Radiology protocol    Answer:   Yes    Order Specific Question:   Preferred imaging location?    Answer:   [place]    Order Specific Question:   Radiology Contrast Protocol - do NOT remove file path    Answer:    [email or web address]    Order Specific Question:   Reason for Exam additional comments    Answer:   2 days abdominal pain- lower abdomen- making it difficult to walk- severe pain with walking. rule out appendicitis and diverticulitis  . CBC with Differential/Platelet  . Basic metabolic panel    [place]  . Urinalysis    Standing Status:   Future    Standing Expiration Date:   01/28/2017   Return precautions advised.  Garret Reddish, MD

## 2016-12-29 NOTE — Patient Instructions (Signed)
Stat labs today  Try to get CT scan tomorrow  Pick up contrast from front desk.   Dont leave unless Neoma Laming Nature conservation officer) says so- may be able to give you appointment today

## 2016-12-29 NOTE — Addendum Note (Signed)
Addended by: Netta Neat D on: 12/29/2016 03:58 PM   Modules accepted: Orders

## 2016-12-30 ENCOUNTER — Telehealth: Payer: Self-pay | Admitting: *Deleted

## 2016-12-30 ENCOUNTER — Ambulatory Visit (INDEPENDENT_AMBULATORY_CARE_PROVIDER_SITE_OTHER)
Admission: RE | Admit: 2016-12-30 | Discharge: 2016-12-30 | Disposition: A | Discharge status: Home / Self Care | Payer: Medicare Other | Source: Ambulatory Visit | Attending: Family Medicine | Admitting: Family Medicine

## 2016-12-30 DIAGNOSIS — R1031 Right lower quadrant pain: Secondary | ICD-10-CM

## 2016-12-30 DIAGNOSIS — C642 Malignant neoplasm of left kidney, except renal pelvis: Secondary | ICD-10-CM

## 2016-12-30 LAB — BASIC METABOLIC PANEL
BUN: 20 mg/dL (ref 6–23)
CALCIUM: 8.7 mg/dL (ref 8.4–10.5)
CO2: 32 mEq/L (ref 19–32)
CREATININE: 0.86 mg/dL (ref 0.40–1.20)
Chloride: 103 mEq/L (ref 96–112)
GFR: 66.95 mL/min (ref >60.00)
GLUCOSE: 107 mg/dL — AB (ref 70–99)
POTASSIUM: 4.1 meq/L (ref 3.5–5.1)
Sodium: 140 mEq/L (ref 135–145)

## 2016-12-30 LAB — CBC WITH DIFFERENTIAL/PLATELET
BASOS PCT: 0.8 % (ref 0.0–3.0)
Basophils Absolute: 0.1 10*3/uL (ref 0.0–0.1)
EOS ABS: 0.1 10*3/uL (ref 0.0–0.7)
EOS PCT: 1.8 % (ref 0.0–5.0)
HEMATOCRIT: 39 % (ref 36.0–46.0)
Hemoglobin: 12.6 g/dL (ref 12.0–15.0)
LYMPHS PCT: 15.7 % (ref 12.0–46.0)
Lymphs Abs: 1.1 10*3/uL (ref 0.7–4.0)
MCHC: 32.3 g/dL (ref 30.0–36.0)
MCV: 93.1 fl (ref 78.0–100.0)
Monocytes Absolute: 0.5 10*3/uL (ref 0.1–1.0)
Monocytes Relative: 7.8 % (ref 3.0–12.0)
NEUTROS ABS: 5 10*3/uL (ref 1.4–7.7)
Neutrophils Relative %: 73.9 % (ref 43.0–77.0)
PLATELETS: 132 10*3/uL — AB (ref 150.0–400.0)
RBC: 4.19 Mil/uL (ref 3.87–5.11)
RDW: 14.1 % (ref 11.5–15.5)
WBC: 6.7 10*3/uL (ref 4.0–10.5)

## 2016-12-30 LAB — URINALYSIS
Bilirubin Urine: NEGATIVE
KETONES UR: NEGATIVE
Leukocytes, UA: NEGATIVE
NITRITE: NEGATIVE
Specific Gravity, Urine: 1.02 (ref 1.000–1.030)
TOTAL PROTEIN, URINE-UPE24: NEGATIVE
Urine Glucose: NEGATIVE
Urobilinogen, UA: 0.2 (ref 0.0–1.0)
pH: 6.5 (ref 5.0–8.0)

## 2016-12-30 MED ORDER — IOPAMIDOL (ISOVUE-300) INJECTION 61%
100.0000 mL | Freq: Once | INTRAVENOUS | Status: AC | PRN
  Administered 2016-12-30: 100 mL via INTRAVENOUS

## 2016-12-30 NOTE — Telephone Encounter (Signed)
Spoke with patient and daughter, [name], NP with [facility]  Will get stat oncology consult to talk about further workup or surgery only and their recommendations on surgeons for this.

## 2016-12-30 NOTE — Telephone Encounter (Signed)
Caller name: [facility]  Reason for call: Call report for CT results. Impression below, full report available in chart review.  IMPRESSION: 3.2 cm enhancing left renal mass is noted concerning for renal cell carcinoma. These results will be called to the ordering clinician or representative by the Radiologist Assistant, and communication documented in the PACS or zVision Dashboard.  Aortic atherosclerosis.  Normal appendix.

## 2016-12-31 ENCOUNTER — Other Ambulatory Visit: Payer: Self-pay

## 2016-12-31 DIAGNOSIS — R93422 Abnormal radiologic findings on diagnostic imaging of left kidney: Secondary | ICD-10-CM

## 2017-01-06 ENCOUNTER — Ambulatory Visit (INDEPENDENT_AMBULATORY_CARE_PROVIDER_SITE_OTHER): Payer: Medicare Other | Admitting: Neurology

## 2017-01-06 ENCOUNTER — Encounter: Payer: Self-pay | Admitting: Neurology

## 2017-01-06 VITALS — BP 140/70 | HR 77 | Ht 68.0 in | Wt 125.1 lb

## 2017-01-06 DIAGNOSIS — F0280 Dementia in other diseases classified elsewhere without behavioral disturbance: Secondary | ICD-10-CM

## 2017-01-06 DIAGNOSIS — R42 Dizziness and giddiness: Secondary | ICD-10-CM

## 2017-01-06 DIAGNOSIS — G309 Alzheimer's disease, unspecified: Secondary | ICD-10-CM | POA: Diagnosis not present

## 2017-01-06 NOTE — Patient Instructions (Signed)
It was great to see you today  Continue mestinon 60mg  twice daily  Return to clinic in 6 months

## 2017-01-06 NOTE — Progress Notes (Signed)
Follow-up Visit   Date: 01/06/2017    Alila Sotero MRN: [id number] DOB: 08/19/1931   Interim History: Verna Desrocher is a 83 y.o. right-handed Caucasian female with PAF s/p PPM (on Eliquis), CAD s/p PCI, NICM EF 35%, hypertension, mild cognitive impairment, remote history of melanoma, and hyperlipidemia returning to the clinic for follow-up of orthostatic lightheadedness.  The patient was accompanied to the clinic by daughter who also provides collateral information.    History of present illness: Starting in 2014, she began having spells of lightheadedness, described as unsteadiness.  She denies dizziness with room spinning.  Review of Epic notes shows that she had a syncopal spell in March 2014 after getting up from the breakfast table to walk to the bathroom.  This lasted a few seconds. She had another episode a couple of days later.  A Holter for 30 days showed occasional SVT. During another episode of severe dizziness, a PPM was placed because she was found to have heart block (2 to 1 block) in October 2014.  Following the PPM, the patient improved slightly, but continued to have lightheadedness especially with prolonged standing. She tried midodrine 2.5mg  before PPM placement and that did not help. She saw Dr. Debria Garret at [facility] neurology who started her on mestinon 30mg  TID and cannot tell whether it helped or not.    On December 23rd, she had another spell of lightheadedness and she activated her medical alert system. She did not loose consciousness.  She felt confused.  EMS arrived and transported her to the emergency deparment where she was found to be orthostatic. She continues to have lightheadedness in the morning and then improves by the evening.  She had noticed that abrupt movements exacerbates symptoms. She is noncompliant with her abdominal binder and stockings.  She had tried drinking lots of fluids, but did not find benefit.  She lives alone and reports to eating small meals.  Breakfast consists of tea and a breakfast bar, lunch is a sandwich, and dinner is microwave lean cuisine meal.  She does not cook.  She had lost 15lb over the past year unintentionally.  She is followed by Dr. Caryl Comes for her cardiac disease and orthostatic hypotension.  Notes indicate that her syncopal spells may have initially been due to high-grade heart block and orthostatic hypotension.  Due to her low blood pressure, her coreg was discontinued a few weeks ago.  She has not noticed a marked difference in her symptoms.  She denies dry eyes, dry mouth, urinary retention, slowed movements, gait imbalance, or falls.   UPDATE 01/06/2017: She is here for six-month appointment and reports to doing well. Since increasing Mestinon to 60 mg twice daily, she no longer has spells of lightheadedness, despite stopping her compression stockings. Her daughter-in-law notes progressive memory changes and patient endorses getting overwhelmed with addressing her mail and bills. She continues to live alone and manages her own IADLs and ADLs.  She is no longer going to [facility] for dementia follow-up. She has previously been tried on Aricept and Namenda, both which she did not tolerate. 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 She endorses feeling depressed and was started on Lexapro which was increased recently to 20 mg daily.   She was recently found to have a renal mass and is scheduled to see oncology for evaluation.  Medications:  Current Outpatient Prescriptions on File Prior to Visit  Medication Sig Dispense Refill  . ELIQUIS 5  MG TABS tablet TAKE 1 TABLET BY MOUTH TWICE DAILY 60 tablet 9  . escitalopram (LEXAPRO) 20 MG tablet Take 1 tablet (20 mg total) by mouth daily. 30 tablet 5  . lisinopril (PRINIVIL,ZESTRIL) 2.5 MG tablet TAKE 1 TABLET(2.5 MG) BY MOUTH DAILY 30 tablet 9  . mirabegron ER (MYRBETRIQ) 25 MG TB24 tablet Take 1 tablet (25 mg total) by mouth  daily. 30 tablet 5  . pyridostigmine (MESTINON) 60 MG tablet TAKE 1 TABLET(60 MG) BY MOUTH TWICE DAILY 60 tablet 5   No current facility-administered medications on file prior to visit.     Allergies:  Allergies  Allergen Reactions  . Aricept [Donepezil] Shortness Of Breath and Other (See Comments)    Hallucinations and loss of sensation in legs  . Nitroglycerin Other (See Comments)    sensitive to tabs which causes rapid drop in blood pressure. Is ok to use oral spray form    Review of Systems:  CONSTITUTIONAL: No fevers, chills, night sweats, +weight loss.  EYES: No visual changes or eye pain ENT: No hearing changes.  No history of nose bleeds.   RESPIRATORY: No cough, wheezing and shortness of breath.   CARDIOVASCULAR: Negative for chest pain, and palpitations.   GI: Negative for abdominal discomfort, blood in stools or black stools.  No recent change in bowel habits.   GU:  No history of incontinence.   MUSCLOSKELETAL: No history of joint pain or swelling.  No myalgias.   SKIN: Negative for lesions, rash, and itching.   ENDOCRINE: Negative for cold or heat intolerance, polydipsia or goiter.   PSYCH:  + depression or anxiety symptoms.   NEURO: As Above.   Vital Signs:  BP 140/70   Pulse 77   Ht 5\' 8"  (1.727 m)   Wt 125 lb 2 oz (56.8 kg)   SpO2 98%   BMI 19.03 kg/m  Orthostatic VS for the past 24 hrs:  BP- Lying Pulse- Lying BP- Sitting Pulse- Sitting BP- Standing at 0 minutes Pulse- Standing at 0 minutes  01/07/17 0801 138/70 80 140/70 77 130/80 68    General:  Well-appearing  Neurological Exam: MENTAL STATUS including orientation to time, place, person, attention span and concentration, language, and fund of knowledge is fairly intact, she does have some short-term memory loss.  Speech is not dysarthric.  CRANIAL NERVES:   Pupils equal round and reactive to light.  Normal conjugate, extra-ocular eye movements in all directions of gaze.  No ptosis.  Face is  symmetric. Palate elevates symmetrically.  Tongue is midline.  MOTOR:  Motor strength is 5/5 in all extremities.  There is reduced muscle bulk throughout. No abnormal movements. Tone is normal.    MSRs:  Reflexes are 2+/4 throughout  SENSORY:  Intact to vibration throughout.  COORDINATION/GAIT: Gait narrow based and stable and unassisted  Data:  ARS 05/09/2013: This is an abnormal study although limited because of the pacemaker. There is evidence for patchy autonomic failure with orthostatic hypotension. Clinical correlation is suggested.   NCS/EMG 05/09/2013: This is a normal study. Specifically, there is no electrophysiological evidence to suggest the presence of peripheral neuropathy on this test.   MRI brain 07/11/2012: No acute infarct. Prominent small vessel disease type changes. Global atrophy without hydrocephalus. Right frontal region sub centimeter calcified structure may represent a small meningioma and is without change. No associate mass effect.  Cervical spondylotic changes with spinal stenosis and cord flattening most prominent C4-5 level.  MRA head 07/11/2012: Left vertebral artery is diminutive  in size after the takeoff of the left PICA. The right vertebral artery is dominant and ectatic. No significant stenosis of the basilar artery. Anterior circulation without medium or large size vessel significant stenosis or occlusion. Branch vessel irregularity as noted above.  Labs 04/30/2014:  HIV NR, RPR NR, folate 10.4, vitamin B12 276  US carotids 07/12/2012: - No significant extracranial carotid artery stenosisdemonstrated. Vertebrals are patent with antegrade flow. - ICA/CCA ratio: right= 1.01. left =0.88.  IMPRESSION/PLAN: 1.  Orthostatic lightheadedness - markedly improved on Mestinon 60 mg 1 tablet twice daily, which will be continued.  She did not tolerate compression stockings, and given her improved symptoms, this is okay to discontinue. I recommend that she  make positional changes only and stay well-hydrated.  Orthostatic vital signs were normal today.  2.  Probable Alzheimer's dementia.  Lengthy discussion regarding home safety, she currently lives alone. I recommend that family oversee her finances and daughter-in-law who is power of attorney, also agrees. She also is getting overwhelmed by the amount of mail that she receives and would like assistance with this. She did not tolerate Aricept and Namenda. Given the marginal benefit with these medications on her medication intolerance, I do not see that introducing another acetylcholinesterase inhibitor would be helpful. I encouraged her to continue to stay physically and cognitively active.  Brain stimulating activities such as puzzles, crosswords, reading, dancing, and music was encouraged.  3.  Left renal mass and unintentional weight loss is concerning for malignancy. She will be establishing care with oncology for further evaluation and management.  Return to clinic in 6 months  The duration of this appointment visit was 30 minutes of face-to-face time with the patient.  Greater than 50% of this time was spent in counseling, explanation of diagnosis, planning of further management, and coordination of care.   Thank you for allowing me to participate in patient's care.  If I can answer any additional questions, I would be pleased to do so.    Sincerely,    Kveon Casanas K. Posey Pronto, DO

## 2017-01-07 ENCOUNTER — Encounter: Payer: Self-pay | Admitting: *Deleted

## 2017-01-07 NOTE — Progress Notes (Signed)
Done

## 2017-01-19 ENCOUNTER — Ambulatory Visit: Payer: Medicare Other | Admitting: Podiatry

## 2017-01-27 ENCOUNTER — Ambulatory Visit (INDEPENDENT_AMBULATORY_CARE_PROVIDER_SITE_OTHER): Payer: Medicare Other | Admitting: Family Medicine

## 2017-01-27 ENCOUNTER — Other Ambulatory Visit: Payer: Self-pay | Admitting: Family Medicine

## 2017-01-27 ENCOUNTER — Encounter: Payer: Self-pay | Admitting: Family Medicine

## 2017-01-27 VITALS — BP 122/62 | HR 74 | Temp 98.3°F | Ht 68.0 in | Wt 133.6 lb

## 2017-01-27 DIAGNOSIS — Z23 Encounter for immunization: Secondary | ICD-10-CM

## 2017-01-27 DIAGNOSIS — N2889 Other specified disorders of kidney and ureter: Secondary | ICD-10-CM

## 2017-01-27 DIAGNOSIS — Z01810 Encounter for preprocedural cardiovascular examination: Secondary | ICD-10-CM | POA: Diagnosis not present

## 2017-01-27 NOTE — Assessment & Plan Note (Signed)
S: discovered in evaluation for lower abdominal pain (ultimately ended up being constipation) but found left renal mass. Suspicion is for renal cell carcinoma. Has seen Dr. Alinda Money of urology- they discussed nephrectomy vs. cryoablation and biopsy vs. Biopsy then decide. Observation also discussed which patient declined. Family and patient want to minimize interruptions in anticoagulation.   Family and patient leaning toward cryoablation and biopsy. Apparently the parenchymals portion of renal cell will respond well but they need more information about potential response rate in the collecting system of kidney. They are to meet with Dr. Kathlene Cote to discuss but have not heard back. Additional will need cardiac clearance- originally they were told needed referral from urology but today during visit they heard need referral from IR.  A/P: extended time spent in counseling. Also spent extended time helping coordinate visit with IR as well as cardiology- fortunately able to get her set up for both today because the waiting has been very difficult for patient and her daughter.

## 2017-01-27 NOTE — Progress Notes (Signed)
Subjective:  Auriella Wieand is a 83 y.o. year old very pleasant female patient who presents for/with See problem oriented charting ROS- prior abdominal pain resolved with miralax. Mild confusion. No fever or chills.  Weight has been stable on our scales.   Past Medical History-  Patient Active Problem List   Diagnosis Date Noted  . Left renal mass 01/27/2017    Priority: High  . CAD (coronary artery disease) 04/17/2014    Priority: High  . Mild cognitive impairment 04/17/2014    Priority: High  . Pacemaker -CRT-St. Jude 05/21/2013    Priority: High  . Atrioventricular block, complete ([place]) 03/08/2013    Priority: High  . Paroxysmal atrial fibrillation (H[facility]) 02/03/2013    Priority: High  . Chronic combined systolic and diastolic congestive heart failure ([place]) 10/10/2012    Priority: High  . Ischemic cardiomyopathy  EF 30% cath 4/14 with stent 10/06/2007    Priority: High  . Overactive bladder 07/09/2016    Priority: Medium  . Dizziness 05/07/2016    Priority: Medium  . Microscopic hematuria 09/02/2015    Priority: Medium  . Asthma, intrinsic 02/03/2013    Priority: Medium  . Depression 09/19/2012    Priority: Medium  . History of melanoma 09/06/2008    Priority: Medium  . History of uterine cancer 09/06/2008    Priority: Medium  . Hyperlipidemia 10/06/2007    Priority: Medium  . Essential hypertension 10/06/2007    Priority: Medium  . Allergic rhinitis 04/17/2014    Priority: Low  . Hearing loss 04/17/2014    Priority: Low  . Varicose veins 09/29/2009    Priority: Low  . LBBB (left bundle branch block) 09/06/2008    Priority: Low    Medications- reviewed and updated Current Outpatient Prescriptions  Medication Sig Dispense Refill  . ELIQUIS 5 MG TABS tablet TAKE 1 TABLET BY MOUTH TWICE DAILY 60 tablet 9  . escitalopram (LEXAPRO) 20 MG tablet Take 1 tablet (20 mg total) by mouth daily. 30 tablet 5  . lisinopril (PRINIVIL,ZESTRIL) 2.5 MG tablet TAKE 1 TABLET(2.5  MG) BY MOUTH DAILY 30 tablet 9  . mirabegron ER (MYRBETRIQ) 25 MG TB24 tablet Take 1 tablet (25 mg total) by mouth daily. 30 tablet 5  . pyridostigmine (MESTINON) 60 MG tablet TAKE 1 TABLET(60 MG) BY MOUTH TWICE DAILY 60 tablet 5   No current facility-administered medications for this visit.     Objective: BP 122/62   Pulse 74   Temp 98.3 F (36.8 C) (Oral)   Ht 5\' 8"  (1.727 m)   Wt 133 lb 9.6 oz (60.6 kg)   SpO2 94%   BMI 20.31 kg/m  Gen: NAD, resting comfortably Does not seem too down about likely cancer fortunately  Assessment/Plan:  Left renal mass S: discovered in evaluation for lower abdominal pain (ultimately ended up being constipation) but found left renal mass. Suspicion is for renal cell carcinoma. Has seen Dr. Alinda Money of urology- they discussed nephrectomy vs. cryoablation and biopsy vs. Biopsy then decide. Observation also discussed which patient declined. Family and patient want to minimize interruptions in anticoagulation.   Family and patient leaning toward cryoablation and biopsy. Apparently the parenchymals portion of renal cell will respond well but they need more information about potential response rate in the collecting system of kidney. They are to meet with Dr. Kathlene Cote to discuss but have not heard back. Additional will need cardiac clearance- originally they were told needed referral from urology but today during visit they heard need referral  from IR.  A/P: extended time spent in counseling. Also spent extended time helping coordinate visit with IR as well as cardiology- fortunately able to get her set up for both today because the waiting has been very difficult for patient and her daughter.   we discussed/counseled on emotional toll on patient as well.   Future Appointments Date Time Provider [place]  01/31/2017 2:00 PM Leanor Kail, [place] [facility]  02/08/2017 1:00 PM [facility]  07/08/2017 3:00 PM Narda Amber K, DO [facility] None   Did not schedule follow up  Orders Placed This Encounter  Procedures  . Flu vaccine HIGH DOSE PF  . Ambulatory referral to Cardiology    Referral Priority:   Routine    Referral Type:   Consultation    Referral Reason:   Specialty Services Required    Referred to Provider:   Deboraha Sprang, MD    Requested Specialty:   Cardiology    Number of Visits Requested:   1   The duration of face-to-face time during this visit was greater than 30 minutes. Greater than 50% of this time was spent in counseling, explanation of diagnosis, planning of further management, and/or coordination of care including getting follow up IR visit scheduled, counseling on mental difficulty of potential cancer and counseling on different options. .    Return precautions advised.  Garret Reddish, MD

## 2017-01-27 NOTE — Patient Instructions (Addendum)
Forgot to ask about the myrbetriq  Scheduled with cardiology- sorry this was not Dr. Caryl Comes Scheduled with interventional radiology  Sorry for your wait

## 2017-01-28 NOTE — Progress Notes (Signed)
Cardiology Office Note    Date:  01/31/2017   ID:  [name] [name], DOB [birth year], MRN [id number]  PCP:  Marin Olp, MD  Cardiologist:  Dr. Caryl Comes   Chief Complaint: Surgical clearance for L renal mass  History of Present Illness:   [name] [name] is a 83 y.o. female with hx of syncope which was in part orthostatic and partially related to intermittent high-grade heart block s/p  CRT P, ICM, CAD s/p CABG, PAF, HTN and HLD presents for surgical clearance.   LHC 4/14 Proximal LAD stent patent, LIMA-LAD atretic, D1 occluded, proximal circumflex 30%, mid RCA occluded, SVG-D1 normal, SVG-OM2 normal, SVG-distal RCA normal, EF 40% with diffuse HK .   She was doing well on cardiac stand point when last seen by Dr. Caryl Comes 02/2016. Last echo 03/2016 showed LVEF of 30-35% (stable) with moderate diffuse hypokinesis, mild to moderate MR, reduced RV function.   Recently evaluated for abdominal pain and constipation. Work up revealed L renal mass suspicious for renal cell carcinoma. Has seen Dr. Alinda Money of urology- they discussed nephrectomy vs. cryoablation and biopsy vs. Biopsy then decide vs observation.   Patient is here for surgical evaluation. Patient denies any chest pain, shortness of breath, palpitations, lower extremity edema, dizziness, orthopnea, PND, syncope or blood in her stool. She admits to having dark stool intermittently. She hasn't used Pepto-Bismol greater than 2 months. Compliant with Eliquis.   Past Medical History:  Diagnosis Date  . Atrial fibrillation ([place]) 10/06/2007   post op  . COPD (chronic obstructive pulmonary disease) ([place])    Emphysema on 02/03/13 CXR  . CORONARY ARTERY DISEASE 10/06/2007   a. s/p PCI to LAD; b. s/p CABG; c. LHC 07/13/12: Proximal LAD stent patent, LIMA-LAD atretic, D1 occluded, proximal circumflex 30%, mid RCA occluded, SVG-D1 normal, SVG-OM2 normal, SVG-distal RCA normal, EF 40% with diffuse HK  . Dizziness   . History of colonic polyps 10/30/2009    No polyps in 2011. No repeat.    Marland Kitchen HYPERLIPIDEMIA 10/06/2007  . HYPERTENSION 10/06/2007  . LBBB 09/06/2008  . MELANOMA 09/06/2008  . MYOCARDIAL INFARCTION, HX OF 10/06/2007  . NICM (nonischemic cardiomyopathy) ([place])    Echocardiogram 07/12/12: EF 25-30%, diffuse HK, mild AI, mild MR, mild LAE  . PERSONAL HX COLONIC POLYPS 10/30/2009  . Syncope   . UTERINE CANCER, HX OF 09/06/2008  . VARICOSE VEIN 09/29/2009    Past Surgical History:  Procedure Laterality Date  . ABDOMINAL HYSTERECTOMY  1988  . BI-VENTRICULAR PACEMAKER INSERTION (CRT-P)  02/02/2013   St. Jude, serial no. #[id number]   . CARDIAC CATHETERIZATION  08/30/2008  . CORONARY ANGIOPLASTY WITH STENT PLACEMENT  2009  . CORONARY ARTERY BYPASS GRAFT  2004  . LEFT HEART CATHETERIZATION WITH CORONARY ANGIOGRAM Bilateral 07/13/2012   Procedure: LEFT HEART CATHETERIZATION WITH CORONARY ANGIOGRAM;  Surgeon: Peter M Martinique, MD;  Location: [facility] CATH LAB;  Service: Cardiovascular;  Laterality: Bilateral;  . OOPHORECTOMY    . PERMANENT PACEMAKER INSERTION N/A 02/02/2013   Procedure: PERMANENT PACEMAKER INSERTION;  Surgeon: Evans Lance, MD;  Location: [facility] CATH LAB;  Service: Cardiovascular;  Laterality: N/A;  . right distal pretibeal     melanoma    Current Medications: Prior to Admission medications   Medication Sig Start Date End Date Taking? Authorizing Provider  ELIQUIS 5 MG TABS tablet TAKE 1 TABLET BY MOUTH TWICE DAILY 05/13/16   Deboraha Sprang, MD  escitalopram (LEXAPRO) 20 MG tablet Take 1 tablet (20 mg total) by mouth daily.  08/25/16   Marin Olp, MD  lisinopril (PRINIVIL,ZESTRIL) 2.5 MG tablet TAKE 1 TABLET(2.5 MG) BY MOUTH DAILY 05/13/16   Deboraha Sprang, MD  mirabegron ER (MYRBETRIQ) 25 MG TB24 tablet Take 1 tablet (25 mg total) by mouth daily. 11/04/16   Marin Olp, MD  pyridostigmine (MESTINON) 60 MG tablet TAKE 1 TABLET(60 MG) BY MOUTH TWICE DAILY 10/27/16   Marin Olp, MD    Allergies:   Aricept [donepezil] and  Nitroglycerin   Social History   Social History  . Marital status: Widowed    Spouse name: N/A  . Number of children: N/A  . Years of education: N/A   Social History Main Topics  . Smoking status: Never Smoker  . Smokeless tobacco: Never Used  . Alcohol use Yes     Comment: daily gin or wine  . Drug use: No  . Sexual activity: Not Asked   Other Topics Concern  . None   Social History Narrative   Widowed. Lives alone with new cat as of 2018. 3 children. 6 grandkids   Daughter  (NP) and son in Sports coach (pharmacist-teaches at [employer]) that live close and help out.       Drives in the daytime, shops for herself, cleaning-has someone over to clean due to the intermittent lightheadedness, bathing      Retired from physical education.            Family History:  The patient's family history includes Heart attack in her father; Stroke in her mother.   ROS:   Please see the history of present illness.    ROS All other systems reviewed and are negative.   PHYSICAL EXAM:   VS:  BP 128/64   Pulse 78   Resp 16   Ht 5\' 8"  (1.727 m)   Wt 136 lb (61.7 kg)   SpO2 98%   BMI 20.68 kg/m    GEN: Well nourished, well developed, in no acute distress  HEENT: normal  Neck: no JVD, carotid bruits, or masses Cardiac: RRR; no murmurs, rubs, or gallops,no edema  Respiratory:  clear to auscultation bilaterally, normal work of breathing GI: soft, nontender, nondistended, + BS MS: no deformity or atrophy  Skin: warm and dry, no rash Neuro:  Alert and Oriented x 3, Strength and sensation are intact Psych: euthymic mood, full affect  Wt Readings from Last 3 Encounters:  01/31/17 136 lb (61.7 kg)  01/27/17 133 lb 9.6 oz (60.6 kg)  01/06/17 125 lb 2 oz (56.8 kg)      Studies/Labs Reviewed:   EKG:  EKG is ordered today.  The ekg ordered today demonstrates sinus rhythm  Recent Labs: 12/29/2016: BUN 20; Creatinine, Ser 0.86; Hemoglobin 12.6; Platelets 132.0; Potassium 4.1; Sodium 140    Lipid Panel    Component Value Date/Time   CHOL 146 08/26/2015 0953   TRIG 60.0 08/26/2015 0953   HDL 49.90 08/26/2015 0953   CHOLHDL 3 08/26/2015 0953   VLDL 12.0 08/26/2015 0953   LDLCALC 84 08/26/2015 0953    Additional studies/ records that were reviewed today include:   Echocardiogram: 03/2016 Study Conclusions  - Left ventricle: The cavity size was normal. Wall thickness was   normal. Systolic function was moderately to severely reduced. The   estimated ejection fraction was in the range of 30% to 35%.   Moderate diffuse hypokinesis with no identifiable regional   variations. - Ventricular septum: Septal motion showed abnormal function,   dyssynergy, and paradox. These  changes are consistent with right   ventricular pacing. - Aortic valve: There was trivial regurgitation. - Mitral valve: There was mild to moderate regurgitation. - Left atrium: The atrium was moderately dilated. - Right ventricle: Systolic function was mildly reduced. - Right atrium: The atrium was moderately dilated.  Cardiac Catheterization:  07/13/12 Coronary Arteries: Right dominant with no anomalies  LM: Normal  LAD: 30% proximal widely patent stent to proximal LAD distal vessel small but fills normally. [place] known to be atretic                          D1: occluded fills from SVG   Circumflex: 30% proximal              OM1:  normal             OM2: competitive flow from SVG  RCA: 100% mid vessel occlusion              PDA: fills from SVG             PLA: fills from SVG  Bypass Grafts:  LIMA: atretic  SVG: D1 normal  SVG OM2 Normal  SVG distal RCA normal  Ventriculography: EF: 40% %, diffuse hypokinesis  Hemodynamics:             Aortic Pressure: 147 74  mmHg             LV Pressure: 146 2  mmHg  Impression:  No evidence of new ischemic disease Angiogram no different than 2010  LBBB with echo showing moderately to severe decrease in EF Medical Rx is  warranted.  Tolerated procedure well Case reviewed with her daughter [name] [name] intensive care unit nurse at [facility]:    1. CAD s/p CABG - No angina or dyspnea.   2. Chronic systolic CHF/ICM - Euvolemic. Continue ACE.   3. CHB s/p Pacemaker-St. Jude-CRT  - followed by Dr. Caryl Comes  4. PAF - Sinus rhythm today. CHADSVASC score of at leas 6. Eliquis for anticoagulation.   5. HTN - Stable on current medication  6. Surgical clearance - RCRI risk is 6.6%. DASI 6.98 METS. Given past medical history and time since last visit, based on ACC/AHA guidelines, [name] [name] would be at acceptable risk for the planned procedure without further cardiovascular testing.  - Reviewed with pharmacist Megan-->Hold Eliquis 48 hours prior to surgery, resume post surgery in 24 hours.   7. Melena - No use of pepto-bismol recently. Check CBC. She will drop off Stool guaiac to PCP office. Continue Eliquis for now.    Medication Adjustments/Labs and Tests Ordered: Current medicines are reviewed at length with the patient today.  Concerns regarding medicines are outlined above.  Medication changes, Labs and Tests ordered today are listed in the Patient Instructions below. Patient Instructions  Medication Instructions:  HOLD Eliquis 70 HOURS before surgery and RESTART Eliquis with 24 HOURS after surgery  Labwork: Your physician recommends that you return for lab work in: [facility]  Testing/Procedures: Stool Card-complete sample and mail it off    Follow-Up: Your physician recommends that you schedule a follow-up appointment in: 2-3 months with Dr Caryl Comes   Any Other Special Instructions Will Be Listed Below (If Applicable).  If you need a refill on your cardiac medications before your next appointment, please call your pharmacy.     Jarrett Soho, PA  01/31/2017 2:44 PM    [facility] [address], [place], [place]  [address] Phone: [phone]; Fax: [phone]

## 2017-01-31 ENCOUNTER — Encounter: Payer: Self-pay | Admitting: Physician Assistant

## 2017-01-31 ENCOUNTER — Telehealth: Payer: Self-pay

## 2017-01-31 ENCOUNTER — Ambulatory Visit (INDEPENDENT_AMBULATORY_CARE_PROVIDER_SITE_OTHER): Payer: Medicare Other | Admitting: Physician Assistant

## 2017-01-31 ENCOUNTER — Encounter (INDEPENDENT_AMBULATORY_CARE_PROVIDER_SITE_OTHER): Payer: Self-pay

## 2017-01-31 VITALS — BP 128/64 | HR 78 | Resp 16 | Ht 68.0 in | Wt 136.0 lb

## 2017-01-31 DIAGNOSIS — Z950 Presence of cardiac pacemaker: Secondary | ICD-10-CM | POA: Diagnosis not present

## 2017-01-31 DIAGNOSIS — I2510 Atherosclerotic heart disease of native coronary artery without angina pectoris: Secondary | ICD-10-CM

## 2017-01-31 DIAGNOSIS — I5022 Chronic systolic (congestive) heart failure: Secondary | ICD-10-CM | POA: Diagnosis not present

## 2017-01-31 DIAGNOSIS — Z01810 Encounter for preprocedural cardiovascular examination: Secondary | ICD-10-CM | POA: Diagnosis not present

## 2017-01-31 DIAGNOSIS — K921 Melena: Secondary | ICD-10-CM | POA: Diagnosis not present

## 2017-01-31 DIAGNOSIS — I480 Paroxysmal atrial fibrillation: Secondary | ICD-10-CM

## 2017-01-31 DIAGNOSIS — I255 Ischemic cardiomyopathy: Secondary | ICD-10-CM

## 2017-01-31 NOTE — Patient Instructions (Addendum)
Medication Instructions:  HOLD Eliquis 57 HOURS before surgery and RESTART Eliquis with 24 HOURS after surgery  Labwork: Your physician recommends that you return for lab work in: [facility]  Testing/Procedures: Stool Card-complete sample and mail it off    Follow-Up: Your physician recommends that you schedule a follow-up appointment in: 2-3 months with Dr Caryl Comes   Any Other Special Instructions Will Be Listed Below (If Applicable).  If you need a refill on your cardiac medications before your next appointment, please call your pharmacy.

## 2017-01-31 NOTE — Telephone Encounter (Signed)
Called patients daughter [name] to get name of Dr doing her mom's surgery. Left messages on home and cell phone. Advised daughter to call triage and leave providers name; and I would fax over a copy of Vin's office not from today to that provider.

## 2017-02-01 ENCOUNTER — Telehealth: Payer: Self-pay | Admitting: *Deleted

## 2017-02-01 LAB — CBC
HEMATOCRIT: 37.8 % (ref 34.0–46.6)
HEMOGLOBIN: 12.5 g/dL (ref 11.1–15.9)
MCH: 29.1 pg (ref 26.6–33.0)
MCHC: 33.1 g/dL (ref 31.5–35.7)
MCV: 88 fL (ref 79–97)
Platelets: 131 10*3/uL — ABNORMAL LOW (ref 150–379)
RBC: 4.3 x10E6/uL (ref 3.77–5.28)
RDW: 14.4 % (ref 12.3–15.4)
WBC: 6.4 10*3/uL (ref 3.4–10.8)

## 2017-02-01 NOTE — Telephone Encounter (Signed)
DPR ok to s/w pt's daughter [name] who has ben notified of lab results by phone with verbal understanding. [name] thanked me for the call today. Results have been forwarded to PCP as well.

## 2017-02-01 NOTE — Telephone Encounter (Signed)
-----   Message from [place], [place] sent at 02/01/2017  8:03 AM EDT ----- Normal labs.

## 2017-02-08 ENCOUNTER — Ambulatory Visit
Admission: RE | Admit: 2017-02-08 | Discharge: 2017-02-08 | Disposition: A | Discharge status: Home / Self Care | Payer: Medicare Other | Source: Ambulatory Visit | Attending: Family Medicine | Admitting: Family Medicine

## 2017-02-08 DIAGNOSIS — N2889 Other specified disorders of kidney and ureter: Secondary | ICD-10-CM

## 2017-02-08 HISTORY — PX: IR RADIOLOGIST EVAL & MGMT: IMG5224

## 2017-02-08 NOTE — Consult Note (Signed)
Chief Complaint: Patient was seen in consultation today for left renal mass at the request of [name] [name]  Referring Physician(s): [name] [name]  History of Present Illness: [name] [name] is a 83 y.[name]. female who had a CT of the abdomen and pelvis performed on 12/30/2016 for evaluation of right sided abdominal pain. This revealed an incidental finding of a roughly 3.2 cm enhancing mass of the posterior left lower kidney. CT on 01/11/2017 at [facility] demonstrated no evidence of metastatic disease in the chest. The mass measures approximately 3.4 cm in maximum diameter. The mass is largely endophytic with deep margin nearly abutting the collecting system. No evidence of renal vein tumor, local lymphadenopathy or metastatic disease in the abdomen. No other suspicious renal lesions are identified bilaterally. [name] [name] is asymptomatic with respect to the renal mass. She did have some hematuria on urinalysis approximately 1 year ago.  Past Medical History:  Diagnosis Date  . Atrial fibrillation ([name] [name]) 10/06/2007   post op  . COPD (chronic obstructive pulmonary disease) ([place])    Emphysema on 02/03/13 CXR  . CORONARY ARTERY DISEASE 10/06/2007   a. s/p PCI to LAD; b. s/p CABG; c. LHC 07/13/12: Proximal LAD stent patent, LIMA-LAD atretic, D1 occluded, proximal circumflex 30%, mid RCA occluded, SVG-D1 normal, SVG-OM2 normal, SVG-distal RCA normal, EF 40% with diffuse HK  . Dizziness   . History of colonic polyps 10/30/2009   No polyps in 2011. No repeat.    [name] [name] HYPERLIPIDEMIA 10/06/2007  . HYPERTENSION 10/06/2007  . LBBB 09/06/2008  . MELANOMA 09/06/2008  . MYOCARDIAL INFARCTION, HX OF 10/06/2007  . NICM (nonischemic cardiomyopathy) ([name] [name])    Echocardiogram 07/12/12: EF 25-30%, diffuse HK, mild AI, mild MR, mild LAE  . PERSONAL HX COLONIC POLYPS 10/30/2009  . Syncope   . UTERINE CANCER, HX OF 09/06/2008  . VARICOSE VEIN 09/29/2009    Past Surgical History:  Procedure Laterality Date  .  ABDOMINAL HYSTERECTOMY  1988  . BI-VENTRICULAR PACEMAKER INSERTION (CRT-P)  02/02/2013   St. Jude, serial no. #[id number]   . CARDIAC CATHETERIZATION  08/30/2008  . CORONARY ANGIOPLASTY WITH STENT PLACEMENT  2009  . CORONARY ARTERY BYPASS GRAFT  2004  . LEFT HEART CATHETERIZATION WITH CORONARY ANGIOGRAM Bilateral 07/13/2012   Procedure: LEFT HEART CATHETERIZATION WITH CORONARY ANGIOGRAM;  Surgeon: Peter M Martinique, MD;  Location: [facility] CATH LAB;  Service: Cardiovascular;  Laterality: Bilateral;  . OOPHORECTOMY    . PERMANENT PACEMAKER INSERTION N/A 02/02/2013   Procedure: PERMANENT PACEMAKER INSERTION;  Surgeon: Evans Lance, MD;  Location: [facility] CATH LAB;  Service: Cardiovascular;  Laterality: N/A;  . right distal pretibeal     melanoma    Allergies: Aricept [donepezil] and Nitroglycerin  Medications: Prior to Admission medications   Medication Sig Start Date End Date Taking? Authorizing Provider  ELIQUIS 5 MG TABS tablet TAKE 1 TABLET BY MOUTH TWICE DAILY 05/13/16   Deboraha Sprang, MD  escitalopram (LEXAPRO) 20 MG tablet Take 1 tablet (20 mg total) by mouth daily. 08/25/16   Marin Olp, MD  lisinopril (PRINIVIL,ZESTRIL) 2.5 MG tablet TAKE 1 TABLET(2.5 MG) BY MOUTH DAILY 05/13/16   Deboraha Sprang, MD  mirabegron ER (MYRBETRIQ) 25 MG TB24 tablet Take 1 tablet (25 mg total) by mouth daily. 11/04/16   Marin Olp, MD  pyridostigmine (MESTINON) 60 MG tablet TAKE 1 TABLET(60 MG) BY MOUTH TWICE DAILY 10/27/16   Marin Olp, MD     Family History  Problem Relation Age of Onset  .  Stroke Mother   . Heart attack Father   . Colon cancer Neg Hx     Social History   Social History  . Marital status: Widowed    Spouse name: N/A  . Number of children: N/A  . Years of education: N/A   Social History Main Topics  . Smoking status: Never Smoker  . Smokeless tobacco: Never Used  . Alcohol use Yes     Comment: daily gin or wine  . Drug use: No  . Sexual activity: Not on file    Other Topics Concern  . Not on file   Social History Narrative   Widowed. Lives alone with new cat as of 2018. 3 children. 6 grandkids   Daughter  (NP) and son in Sports coach (pharmacist-teaches at [employer]) that live close and help out.       Drives in the daytime, shops for herself, cleaning-has someone over to clean due to the intermittent lightheadedness, bathing      Retired from physical education.           Review of Systems: A 12 point ROS discussed and pertinent positives are indicated in the HPI above.  All other systems are negative.  Review of Systems  Constitutional: Negative.   Respiratory: Negative.   Cardiovascular: Negative.   Gastrointestinal: Negative.   Genitourinary: Negative.   Musculoskeletal: Negative.   Neurological: Negative.     Vital Signs: BP (!) 112/58   Pulse 74   Temp 98.1 F (36.7 C) (Oral)   Resp 14   Ht 5' 8" (1.727 m)   Wt 136 lb (61.7 kg)   SpO2 95%   BMI 20.68 kg/m   Physical Exam  Constitutional: She is oriented to person, place, and time. She appears well-developed and well-nourished. No distress.  HENT:  Head: Normocephalic and atraumatic.  Neck: Neck supple. No JVD present. No tracheal deviation present. No thyromegaly present.  Cardiovascular: Normal rate, regular rhythm and normal heart sounds.  Exam reveals no gallop and no friction rub.   No murmur heard. Pulmonary/Chest: Effort normal and breath sounds normal. No stridor. No respiratory distress. She has no wheezes. She has no rales.  Abdominal: Soft. Bowel sounds are normal. She exhibits no distension and no mass. There is no tenderness. There is no rebound and no guarding.  Musculoskeletal: She exhibits no edema.  Lymphadenopathy:    She has no cervical adenopathy.  Neurological: She is alert and oriented to person, place, and time.  Skin: Skin is warm and dry. She is not diaphoretic.  Vitals reviewed.   Imaging: No results found.  Labs:  CBC:  Recent Labs   04/03/16 1556 12/29/16 1551 01/31/17 1505  WBC 5.0 6.7 6.4  HGB 12.1 12.6 12.5  HCT 36.5 39.0 37.8  PLT 108* 132.0* 131*    COAGS: No results for input(s): INR, APTT in the last 8760 hours.  BMP:  Recent Labs  04/03/16 1556 04/21/16 1219 12/29/16 1551  NA 137 138 140  K 3.7 3.8 4.1  CL 105 101 103  CO2 25 32 32  GLUCOSE 87 90 107*  BUN _0 CALCIUM 8.6* 9.4 8.7  CREATININE 0.78 0.75 0.86  GFRNONAA >60  --   --   GFRAA >60  --   --      Assessment and Plan:  I met with Mrs. Davidson and her daughter [name], who is a Designer, jewellery at [employer].  We reviewed imaging findings. By my measurements, maximal dimensions  of the left renal mass are approximately 3.3 x 3.5 x 3.0 cm by CT. Although the mass does abut the posterior lower pole collecting system, it is far enough away from the renal pelvis and ureter that it should be amenable to safe percutaneous cryoablation. The mass is amenable to ablation based on size. Renal function is normal.  Details of cryoablation were discussed with Mrs. Zellars and her daughter including risks. The procedure is performed under general anesthesia and involves overnight observation. The mass is almost certainly a clear cell carcinoma based on avid arterial phase enhancement pattern by CT. Separate biopsy prior to ablation would not be necessary based on high degree of suspicion of malignancy. Biopsy can be performed at the time of ablation to establish a tissue diagnosis.  Given significant cardiac history of heart block, prior defibrillator placement , anticoagulation for atrial fibrillation and coronary artery disease with prior coronary stent placement, there would be an indication for minimally invasive treatment of the renal mass.  Treatment options were discussed including further surveillance, partial nephrectomy, radical nephrectomy and percutaneous ablation. Options have also been previously discussed by Dr. Alinda Money with them. After  discussion, the patient and her daughter would like to pursue scheduling cryoablation of the renal mass. We will begin the scheduling process. Cardiac clearance has already been obtained from Dr. Caryl Comes and documented in [place]. Eliquis will be held for 2 full days prior to the procedure.  Thank you for this interesting consult.  I greatly enjoyed meeting Leilanni Halvorson and look forward to participating in their care.  A copy of this report was sent to the requesting provider on this date.  Electronically SignedAletta Edouard T 02/08/2017, 4:39 PM   I spent a total of 40 Minutes in face to face in clinical consultation, greater than 50% of which was counseling/coordinating care for a left renal mass.

## 2017-02-10 LAB — FECAL OCCULT BLOOD, IMMUNOCHEMICAL: Fecal Occult Bld: NEGATIVE

## 2017-02-15 ENCOUNTER — Other Ambulatory Visit (HOSPITAL_COMMUNITY): Payer: Self-pay | Admitting: Interventional Radiology

## 2017-02-15 DIAGNOSIS — N2889 Other specified disorders of kidney and ureter: Secondary | ICD-10-CM

## 2017-03-02 ENCOUNTER — Encounter: Payer: Self-pay | Admitting: Interventional Radiology

## 2017-03-05 ENCOUNTER — Other Ambulatory Visit: Payer: Self-pay | Admitting: Family Medicine

## 2017-03-22 ENCOUNTER — Other Ambulatory Visit: Payer: Self-pay | Admitting: Radiology

## 2017-03-22 ENCOUNTER — Other Ambulatory Visit: Payer: Self-pay

## 2017-03-22 ENCOUNTER — Encounter (HOSPITAL_COMMUNITY)
Admission: RE | Admit: 2017-03-22 | Discharge: 2017-03-22 | Disposition: A | Discharge status: Home / Self Care | Payer: Medicare Other | Source: Ambulatory Visit | Attending: Interventional Radiology | Admitting: Interventional Radiology

## 2017-03-22 ENCOUNTER — Encounter (HOSPITAL_COMMUNITY): Payer: Self-pay

## 2017-03-22 ENCOUNTER — Other Ambulatory Visit (HOSPITAL_COMMUNITY): Payer: Medicare Other

## 2017-03-22 ENCOUNTER — Ambulatory Visit (HOSPITAL_COMMUNITY)
Admission: RE | Admit: 2017-03-22 | Discharge: 2017-03-22 | Disposition: A | Discharge status: Home / Self Care | Payer: Medicare Other | Source: Ambulatory Visit | Attending: Radiology | Admitting: Radiology

## 2017-03-22 DIAGNOSIS — Z01818 Encounter for other preprocedural examination: Secondary | ICD-10-CM | POA: Insufficient documentation

## 2017-03-22 DIAGNOSIS — Z01812 Encounter for preprocedural laboratory examination: Secondary | ICD-10-CM | POA: Diagnosis not present

## 2017-03-22 DIAGNOSIS — N2889 Other specified disorders of kidney and ureter: Secondary | ICD-10-CM | POA: Diagnosis not present

## 2017-03-22 HISTORY — DX: Presence of cardiac pacemaker: Z95.0

## 2017-03-22 HISTORY — DX: Other specified disorders of kidney and ureter: N28.89

## 2017-03-22 LAB — ABO/RH: ABO/RH(D): A POS

## 2017-03-22 LAB — BASIC METABOLIC PANEL
Anion gap: 8 (ref 5–15)
BUN: 13 mg/dL (ref 6–20)
CO2: 29 mmol/L (ref 22–32)
Calcium: 9 mg/dL (ref 8.9–10.3)
Chloride: 103 mmol/L (ref 101–111)
Creatinine, Ser: 0.72 mg/dL (ref 0.44–1.00)
GFR calc Af Amer: >60 mL/min (ref >60)
GLUCOSE: 85 mg/dL (ref 65–99)
POTASSIUM: 4 mmol/L (ref 3.5–5.1)
Sodium: 140 mmol/L (ref 135–145)

## 2017-03-22 LAB — CBC WITH DIFFERENTIAL/PLATELET
Basophils Absolute: 0 K/uL (ref 0.0–0.1)
Basophils Relative: 0 %
Eosinophils Absolute: 0.1 K/uL (ref 0.0–0.7)
Eosinophils Relative: 1 %
HCT: 40.7 % (ref 36.0–46.0)
Hemoglobin: 13.2 g/dL (ref 12.0–15.0)
Lymphocytes Relative: 25 %
Lymphs Abs: 1.9 K/uL (ref 0.7–4.0)
MCH: 29.5 pg (ref 26.0–34.0)
MCHC: 32.4 g/dL (ref 30.0–36.0)
MCV: 91.1 fL (ref 78.0–100.0)
Monocytes Absolute: 0.6 K/uL (ref 0.1–1.0)
Monocytes Relative: 8 %
Neutro Abs: 5.1 K/uL (ref 1.7–7.7)
Neutrophils Relative %: 66 %
Platelets: 135 K/uL — ABNORMAL LOW (ref 150–400)
RBC: 4.47 MIL/uL (ref 3.87–5.11)
RDW: 13.8 % (ref 11.5–15.5)
WBC: 7.7 K/uL (ref 4.0–10.5)

## 2017-03-22 LAB — PROTIME-INR
INR: 1.1
Prothrombin Time: 14.1 seconds (ref 11.4–15.2)

## 2017-03-22 NOTE — Progress Notes (Signed)
Cardiac clearance note 01-31-17 bhavinkumar bhagat pa dr Caryl Comes epic  echo 01-31-17 epic ekg 01-31-17 epic

## 2017-03-22 NOTE — Progress Notes (Signed)
CALLED DR Tobias Alexander ANESTHESIA AND MADE AWARE PATIENT MEDICAL HISTORY AND HAS CLEARANCE PER DR Caryl Comes PA, ANESTHESIA WILL SEE PT DAY OF SURGERY PER DR SINGER.

## 2017-03-22 NOTE — Patient Instructions (Addendum)
[name] [name]  03/22/2017   Your procedure is scheduled on: 03/28/2017    Report to [facility]  Entrance check in at Radiology then  Take [facility]  elevators to 3rd floor to  [place] at    [place] AM.     Call this number if you have problems the morning of surgery [phone]    Remember: ONLY 1 PERSON MAY GO WITH YOU TO SHORT STAY TO GET  READY MORNING OF [place].  Do not eat food or drink liquids :After Midnight.     Take these medicines the morning of surgery with A SIP OF WATER:  Myrbetriq                               You may not have any metal on your body including hair pins and              piercings  Do not wear jewelry, lotions,, makeup ,  powders or perfumes, deodorant           No nailpolish.      Do not bring valuables to the hospital. [place].  Contacts, dentures or bridgework may not be worn into surgery.  Leave suitcase in the car. After surgery it may be brought to your room.                        Please read over the following fact sheets you were given: _____________________________________________________________________             [facility] - Preparing for Surgery Before surgery, you can play an important role.  Because skin is not sterile, your skin needs to be as free of germs as possible.  You can reduce the number of germs on your skin by washing with CHG (chlorahexidine gluconate) soap before surgery.  CHG is an antiseptic cleaner which kills germs and bonds with the skin to continue killing germs even after washing. Please DO NOT use if you have an allergy to CHG or antibacterial soaps.  If your skin becomes reddened/irritated stop using the CHG and inform your nurse when you arrive at Short Stay. Do not shave (including legs and underarms) for at least 48 hours prior to the first CHG shower.  You may shave your face/neck. Please follow these  instructions carefully:  1.  Shower with CHG Soap the night before surgery and the  morning of Surgery.  2.  If you choose to wash your hair, wash your hair first as usual with your  normal  shampoo.  3.  After you shampoo, rinse your hair and body thoroughly to remove the  shampoo.                           4.  Use CHG as you would any other liquid soap.  You can apply chg directly  to the skin and wash                       Gently with a scrungie or clean washcloth.  5.  Apply the CHG Soap to your body ONLY FROM THE NECK DOWN.  Do not use on face/ open                           Wound or open sores. Avoid contact with eyes, ears mouth and genitals (private parts).                       Wash face,  Genitals (private parts) with your normal soap.             6.  Wash thoroughly, paying special attention to the area where your surgery  will be performed.  7.  Thoroughly rinse your body with warm water from the neck down.  8.  DO NOT shower/wash with your normal soap after using and rinsing off  the CHG Soap.                9.  Pat yourself dry with a clean towel.            10.  Wear clean pajamas.            11.  Place clean sheets on your bed the night of your first shower and do not  sleep with pets. Day of Surgery : Do not apply any lotions/deodorants the morning of surgery.  Please wear clean clothes to the hospital/surgery center.  FAILURE TO FOLLOW THESE INSTRUCTIONS MAY RESULT IN THE CANCELLATION OF YOUR SURGERY PATIENT SIGNATURE_________________________________  NURSE SIGNATURE__________________________________  ________________________________________________________________________

## 2017-03-23 ENCOUNTER — Other Ambulatory Visit (HOSPITAL_COMMUNITY): Payer: Medicare Other

## 2017-03-24 NOTE — Progress Notes (Signed)
SPOKE WITH ABBOTT REP BRIAN SMALL WILL ARRIVE 1100 AM [facility] PACU FOR PACEMAKER INTERROGATON IF [place] CELL [phone], NOTE PLACED OM OR SCHEDULE

## 2017-03-25 ENCOUNTER — Other Ambulatory Visit: Payer: Self-pay | Admitting: Radiology

## 2017-03-25 ENCOUNTER — Other Ambulatory Visit: Payer: Self-pay | Admitting: General Surgery

## 2017-03-27 ENCOUNTER — Encounter (HOSPITAL_COMMUNITY): Payer: Self-pay | Admitting: Anesthesiology

## 2017-03-27 NOTE — Anesthesia Preprocedure Evaluation (Addendum)
Anesthesia Evaluation  Patient identified by MRN, date of birth, ID band Patient awake    Reviewed: Allergy & Precautions, NPO status , Patient's Chart, lab work & pertinent test results  Airway Mallampati: I       Dental no notable dental hx. (+) Teeth Intact   Pulmonary    Pulmonary exam normal breath sounds clear to auscultation       Cardiovascular hypertension, Pt. on medications Normal cardiovascular exam Rhythm:Regular Rate:Normal     Neuro/Psych PSYCHIATRIC DISORDERS Depression    GI/Hepatic negative GI ROS, Neg liver ROS,   Endo/Other  negative endocrine ROS  Renal/GU negative Renal ROS     Musculoskeletal negative musculoskeletal ROS (+)   Abdominal Normal abdominal exam  (+)   Peds  Hematology negative hematology ROS (+)   Anesthesia Other Findings [name] [name]  ECHO COMPLETE WO IMAGING ENHANCING AGENT  Order# [id number]  Reading physician: Sanda Klein, MD Ordering physician: Deboraha Sprang, MD Study date: 04/09/16 Result Notes for ECHOCARDIOGRAM COMPLETE  [place]  Order# [id number]  Ordering physician: Deboraha Sprang, MD Study date: 03/10/2016 Conclusion   CRT-P device check in clinic. Normal device function. Thresholds, sensing, impedance consistent with previous measurements. Histograms appropriate for patient and level of activity. <1% AT/AF burden + Eliquis. No ventricular high rate episodes. Patient  bi-ventricularly pacing 98% of the time. Device programmed with appropriate safety margins. Device heart failure diagnostics are within normal limits and stable over time. Estimated longevity 7.8-8.1 years. Plan to follow up via [facility] on 2/27 and with  SK in 12 months.[name] Result     Notes Recorded by Deboraha Sprang, MD on 04/13/2016 at 9:46 AM EST Please Inform Patient Echo showed stable heart muscle function  at 30-35%  RV and RA  are now less normal, possibly releated to pacemaker lead, maybe chronic LV dysfunction Nothing specific to do    Study Result   Result status: Final result                          [name]                        [address], [place] [address]                            [phone]  ------------------------------------------------------------------- Transthoracic Echocardiography  Patient:    [name] [name], [name] [name] MR #:       [id number] Study Date: 04/09/2016 Gender:     F Age:        83 Height:     167.6 cm Weight:     66.2 kg BSA:        1.76 m^2 Pt. Status: Room:   SONOGRAPHER  Wyatt Mage, RDCS  ATTENDING    Sanda Klein, MD  Dwyane Luo, Mickle Mallory  PERFORMING   [facility], Outpatient  cc:  ------------------------------------------------------------------- LV EF: 30% -   35%  ------------------------------------------------------------------- Indications:      CHF (I50.42).  ------------------------------------------------------------------- History:   PMH:   Atrial fibrillation.  Coronary artery disease. Hypertrophic cardiomyopathy.  Congestive heart failure.  Risk factors:  LBBB. Complete AV block.  Hypertension. Dyslipidemia.  ------------------------------------------------------------------- Study Conclusions  - Left ventricle: The cavity size was normal. Wall thickness was   normal. Systolic function was moderately to severely reduced. The   estimated ejection fraction was in the range of 30% to 35%.   Moderate diffuse hypokinesis with no identifiable regional   variations. - Ventricular septum: Septal motion showed abnormal function,   dyssynergy, and paradox. These changes are consistent with right   ventricular pacing. - Aortic valve: There was trivial regurgitation. - Mitral valve: There was mild to moderate regurgitation. - Left atrium: The atrium was moderately  dilated. - Right ventricle: Systolic function was mildly reduced. - Right atrium: The atrium was moderately dilated.  ------------------------------------------------------------------- Labs, prior tests, procedures, and surgery: Transthoracic echocardiography (05/21/2013).     EF was 35% and PA pressure was 34 (systolic).  Permanent pacemaker system implantation.    Currently implanted device: St. Judepermanent pacemaker.  ------------------------------------------------------------------- Study data:  Comparison was made to the study of 05/21/2013.  Study status:  Routine.  Procedure:  The patient reported no pain pre or post test. Transthoracic echocardiography. Image quality was adequate.  Study completion:  There were no complications. Transthoracic echocardiography.  M-mode, complete 2D, spectral Doppler, and color Doppler.  Birthdate:  Patient birthdate: May 29, 1931.  Age:  Patient is 83 yr old.  Sex:  Gender: female. BMI: 23.6 kg/m^2.  Blood pressure:     146/74  Patient status: Outpatient.  Study date:  Study date: 04/09/2016. Study time: 01:29 PM.  Location:  [facility]  -------------------------------------------------------------------  ------------------------------------------------------------------- Left ventricle:  The cavity size was normal. Wall thickness was normal. Systolic function was moderately to severely reduced. The estimated ejection fraction was in the range of 30% to 35%. Moderate diffuse hypokinesis with no identifiable regional variations.  ------------------------------------------------------------------- Aortic valve:   Structurally normal valve. Trileaflet. Cusp separation was normal.  Doppler:  Transvalvular velocity was within the normal range. There was no stenosis. There was trivial regurgitation.  ------------------------------------------------------------------- Aorta:  Aortic root: The aortic root was normal in  size. Ascending aorta: The ascending aorta was normal in size.  ------------------------------------------------------------------- Mitral valve:   Structurally normal valve.   Leaflet separation was normal.  Doppler:  Transvalvular velocity was within the normal range. There was no evidence for stenosis. There was mild to moderate regurgitation.  ------------------------------------------------------------------- Left atrium:  The atrium was moderately dilated.  ------------------------------------------------------------------- Right ventricle:  The cavity size was normal. Pacer wire or catheter noted in right ventricle. Systolic function was mildly reduced.  ------------------------------------------------------------------- Ventricular septum:   Septal motion showed abnormal function, dyssynergy, and paradox. These changes are consistent with right ventricular pacing.  ------------------------------------------------------------------- Pulmonic valve:    Structurally normal valve.   Cusp separation was normal.  Doppler:  Transvalvular velocity was within the normal range. There was trivial regurgitation.  ------------------------------------------------------------------- Tricuspid valve:   Structurally normal valve.   Leaflet separation was normal.  Doppler:  Transvalvular velocity was within the normal range. There was trivial regurgitation.  ------------------------------------------------------------------- Pulmonary artery:    Systolic pressure could not be accurately estimated.  ------------------------------------------------------------------- Right atrium:  The atrium was moderately dilated. Pacer wire or catheter noted in right atrium.  ------------------------------------------------------------------- Pericardium:  There was no pericardial effusion.  ------------------------------------------------------------------- Systemic veins: Inferior vena  cava: The vessel was normal in size. The respirophasic diameter changes were in the normal range (>= 50%), consistent with normal central venous pressure.  ------------------------------------------------------------------- Measurements   Left ventricle  Value        Reference  LV ID, ED, PLAX chordal          (H)     53.97 mm     43 - 52  LV ID, ES, PLAX chordal          (H)     44.48 mm     23 - 38  LV fx shortening, PLAX chordal   (L)     18    %      >=29  LV PW thickness, ED                      8.55  mm     ---------  IVS/LV PW ratio, ED                      0.95         <=1.3  Stroke volume, 2D                        51    ml     ---------  Stroke volume/bsa, 2D                    29    ml/m^2 ---------    Ventricular septum                       Value        Reference  IVS thickness, ED                        8.15  mm     ---------    LVOT                                     Value        Reference  LVOT ID, S                               20    mm     ---------  LVOT area                                3.14  cm^2   ---------  LVOT peak velocity, S                    95    cm/s   ---------  LVOT mean velocity, S                    61.7  cm/s   ---------  LVOT VTI, S                              16.1  cm     ---------    Aortic valve                             Value        Reference  Aortic regurg pressure half-time         532   ms     ---------  Aorta                                    Value        Reference  Aortic root ID, ED                       35    mm     ---------  Ascending aorta ID, A-P, S               38    mm     ---------    Left atrium                              Value        Reference  LA ID, A-P, ES                           46    mm     ---------  LA ID/bsa, A-P                   (H)     2.61  cm/m^2 <=2.2  LA volume, S                             76.4  ml     ---------  LA volume/bsa, S                         43.4  ml/m^2  ---------  LA volume, ES, 1-p A4C                   50.7  ml     ---------  LA volume/bsa, ES, 1-p A4C               28.8  ml/m^2 ---------  LA volume, ES, 1-p A2C                   95.8  ml     ---------  LA volume/bsa, ES, 1-p A2C               54.4  ml/m^2 ---------    Mitral valve                             Value        Reference  Mitral regurg VTI, PISA                  202   cm     ---------  Mitral ERO, PISA                         0.14  cm^2   ---------  Mitral regurg volume, PISA               28    ml     ---------    Systemic veins                           Value        Reference  Estimated CVP  3     mm Hg  ---------    Pulmonic valve                           Value        Reference  Pulmonic regurg velocity, ED             206   cm/s   ---------  Pulmonic regurg gradient, ED             17    mm Hg  ---------  Legend: (L)  and  (H)  mark values outside specified reference range.  ------------------------------------------------------------------- Prepared and Electronically Authenticated by  Sanda Klein, MD 2017-12-29T16:12:09 [facility] Images   Show images for ECHOCARDIOGRAM COMPLETE Patient Information   Patient Name [name] [name], [name] [name] Sex Female DOB [birth year] SSN [id number] Reason For Exam  Priority: Routine  Dx: Chronic combined systolic and diastolic congestive heart failure ([place]) (I50.42 (ICD-10-CM)) Surgical History   Surgical History    Procedure Laterality Date Comment Source CARDIAC CATHETERIZATION  08/30/2008  Provider CORONARY ANGIOPLASTY WITH STENT PLACEMENT  2009  Provider CORONARY ARTERY BYPASS GRAFT  2004  Provider  Other Surgical History    Procedure Laterality Date Comment Source ABDOMINAL HYSTERECTOMY  1988  Provider BI-VENTRICULAR PACEMAKER INSERTION (CRT-P)  02/02/2013 St. Jude, serial no. #[id number]  Provider IR RADIOLOGIST EVAL & MGMT  02/08/2017  Provider LEFT HEART CATHETERIZATION WITH CORONARY  ANGIOGRAM Bilateral 07/13/2012 Procedure: LEFT HEART CATHETERIZATION WITH CORONARY ANGIOGRAM; Surgeon: Peter M Martinique, MD; Location: [facility] C[facility]; Service: Cardiovascular; Laterality: Bilateral; Provider OOPHORECTOMY    Provider PERMANENT PACEMAKER INSERTION N/A 02/02/2013 Procedure: PERMANENT PACEMAKER INSERTION; Surgeon: Evans Lance, MD; Location: [facility] CATH LAB; Service: Cardiovascular; Laterality: N/A; Provider right distal pretibeal   melanoma Provider  Performing Technologist/Nurse   Performing Technologist/Nurse: Etter Sjogren, [place] [phone] - Implanted     Model/Cat number: 8101 Serial number: [id number] Manufacturer: [place]   As of 03/10/2016     Status: Implanted    Sjcr [id number] - Implanted     Model/Cat number: 4315Q Serial number: [id number] Manufacturer: [place] M   As of 02/05/2015     Status: Implanted    Sjcr 2088tc [id number] - Implanted     Model/Cat number: 2088TC Serial number: [id number] Manufacturer: [place] M   As of 02/05/2015     Status: Implanted    Sjcr 2088tc [id number] - Implanted     Model/Cat number: 2088TC Serial number: [id number] Manufacturer: ST JUDE MED CARDIAC RHYTHM M   As of 02/05/2015     Status: Implanted    Sjcr 4097D Quartet [id number] - Implanted     Model/Cat number: 8341D QUARTET Serial number: [id number] Manufacturer: ST JUDE MED CARDIAC RHYTHM M   As of 03/10/2016     Status: Implanted    Sjcr 2088tc Tendril Sts [id number] - Implanted     Model/Cat number: 2088TC TENDRIL STS Serial number: [id number] Manufacturer: ST JUDE MED CARDIAC RHYTHM M   As of 03/10/2016     Status: Implanted    Sjcr 2088tc Tendril Sts [id number] - Implanted     Model/Cat number: 2088TC TENDRIL STS Serial number: [id number] Manufacturer: [place] M   As of 03/10/2016      Status: Implanted    Order-Level Documents:  There are no order-level documents.  Encounter-Level Documents - 04/09/2016:   Electronic signature on 04/09/2016 1:43 PM  Electronic signature on 04/09/2016 1:42 PM    Signed   Electronically signed by Sanda Klein, MD on 04/09/16 at 1612 EST Printable Result Report    Result Report  External Result Report    External Result Report     Reproductive/Obstetrics                            Anesthesia Physical Anesthesia Plan  ASA: III  Anesthesia Plan: General   Post-op Pain Management:    Induction: Intravenous  PONV Risk Score and Plan: 4 or greater and 0 and Ondansetron  Airway Management Planned: Oral ETT  Additional Equipment:   Intra-op Plan:   Post-operative Plan: Extubation in OR  Informed Consent: I have reviewed the patients History and Physical, chart, labs and discussed the procedure including the risks, benefits and alternatives for the proposed anesthesia with the patient or authorized representative who has indicated his/her understanding and acceptance.   Dental advisory given  Plan Discussed with: CRNA and Surgeon  Anesthesia Plan Comments:        Anesthesia Quick Evaluation

## 2017-03-28 ENCOUNTER — Encounter (HOSPITAL_COMMUNITY): Payer: Self-pay

## 2017-03-28 ENCOUNTER — Other Ambulatory Visit: Payer: Self-pay

## 2017-03-28 ENCOUNTER — Ambulatory Visit (HOSPITAL_COMMUNITY)
Admission: RE | Admit: 2017-03-28 | Discharge: 2017-03-28 | Disposition: A | Discharge status: Home / Self Care | Payer: Medicare Other | Source: Ambulatory Visit | Attending: Interventional Radiology | Admitting: Interventional Radiology

## 2017-03-28 ENCOUNTER — Inpatient Hospital Stay (HOSPITAL_COMMUNITY)
Admission: RE | Admit: 2017-03-28 | Discharge: 2017-03-31 | DRG: 660 | Disposition: A | Discharge status: Home Health | Payer: Medicare Other | Source: Ambulatory Visit | Attending: Internal Medicine | Admitting: Internal Medicine

## 2017-03-28 ENCOUNTER — Ambulatory Visit (HOSPITAL_COMMUNITY): Payer: Medicare Other | Admitting: Anesthesiology

## 2017-03-28 ENCOUNTER — Encounter (HOSPITAL_COMMUNITY)
Admission: RE | Disposition: A | Discharge status: Home Health | Payer: Self-pay | Source: Ambulatory Visit | Attending: Interventional Radiology

## 2017-03-28 ENCOUNTER — Encounter (HOSPITAL_COMMUNITY): Payer: Self-pay | Admitting: Anesthesiology

## 2017-03-28 DIAGNOSIS — Z8249 Family history of ischemic heart disease and other diseases of the circulatory system: Secondary | ICD-10-CM

## 2017-03-28 DIAGNOSIS — Z90710 Acquired absence of both cervix and uterus: Secondary | ICD-10-CM

## 2017-03-28 DIAGNOSIS — Z950 Presence of cardiac pacemaker: Secondary | ICD-10-CM

## 2017-03-28 DIAGNOSIS — J449 Chronic obstructive pulmonary disease, unspecified: Secondary | ICD-10-CM | POA: Diagnosis present

## 2017-03-28 DIAGNOSIS — I252 Old myocardial infarction: Secondary | ICD-10-CM

## 2017-03-28 DIAGNOSIS — N179 Acute kidney failure, unspecified: Secondary | ICD-10-CM | POA: Diagnosis present

## 2017-03-28 DIAGNOSIS — I5042 Chronic combined systolic (congestive) and diastolic (congestive) heart failure: Secondary | ICD-10-CM | POA: Diagnosis present

## 2017-03-28 DIAGNOSIS — N3289 Other specified disorders of bladder: Secondary | ICD-10-CM | POA: Diagnosis present

## 2017-03-28 DIAGNOSIS — Z955 Presence of coronary angioplasty implant and graft: Secondary | ICD-10-CM

## 2017-03-28 DIAGNOSIS — D62 Acute posthemorrhagic anemia: Secondary | ICD-10-CM

## 2017-03-28 DIAGNOSIS — I255 Ischemic cardiomyopathy: Secondary | ICD-10-CM | POA: Diagnosis present

## 2017-03-28 DIAGNOSIS — I110 Hypertensive heart disease with heart failure: Secondary | ICD-10-CM | POA: Diagnosis present

## 2017-03-28 DIAGNOSIS — Y848 Other medical procedures as the cause of abnormal reaction of the patient, or of later complication, without mention of misadventure at the time of the procedure: Secondary | ICD-10-CM | POA: Diagnosis present

## 2017-03-28 DIAGNOSIS — F329 Major depressive disorder, single episode, unspecified: Secondary | ICD-10-CM | POA: Diagnosis present

## 2017-03-28 DIAGNOSIS — Y92239 Unspecified place in hospital as the place of occurrence of the external cause: Secondary | ICD-10-CM | POA: Diagnosis present

## 2017-03-28 DIAGNOSIS — I2510 Atherosclerotic heart disease of native coronary artery without angina pectoris: Secondary | ICD-10-CM | POA: Diagnosis present

## 2017-03-28 DIAGNOSIS — N2889 Other specified disorders of kidney and ureter: Secondary | ICD-10-CM | POA: Diagnosis present

## 2017-03-28 DIAGNOSIS — I10 Essential (primary) hypertension: Secondary | ICD-10-CM | POA: Diagnosis present

## 2017-03-28 DIAGNOSIS — I480 Paroxysmal atrial fibrillation: Secondary | ICD-10-CM | POA: Diagnosis present

## 2017-03-28 DIAGNOSIS — I959 Hypotension, unspecified: Secondary | ICD-10-CM | POA: Diagnosis present

## 2017-03-28 DIAGNOSIS — Z951 Presence of aortocoronary bypass graft: Secondary | ICD-10-CM

## 2017-03-28 DIAGNOSIS — S37012A Minor contusion of left kidney, initial encounter: Principal | ICD-10-CM | POA: Diagnosis present

## 2017-03-28 DIAGNOSIS — S37092A Other injury of left kidney, initial encounter: Secondary | ICD-10-CM

## 2017-03-28 DIAGNOSIS — Z79899 Other long term (current) drug therapy: Secondary | ICD-10-CM

## 2017-03-28 DIAGNOSIS — N3281 Overactive bladder: Secondary | ICD-10-CM | POA: Diagnosis present

## 2017-03-28 DIAGNOSIS — R413 Other amnesia: Secondary | ICD-10-CM | POA: Diagnosis present

## 2017-03-28 DIAGNOSIS — I5020 Unspecified systolic (congestive) heart failure: Secondary | ICD-10-CM | POA: Diagnosis present

## 2017-03-28 DIAGNOSIS — Z7901 Long term (current) use of anticoagulants: Secondary | ICD-10-CM

## 2017-03-28 HISTORY — PX: RADIOLOGY WITH ANESTHESIA: SHX6223

## 2017-03-28 SURGERY — RADIOLOGY WITH ANESTHESIA
Anesthesia: General | Laterality: Left

## 2017-03-28 MED ORDER — ROCURONIUM BROMIDE 10 MG/ML (PF) SYRINGE
PREFILLED_SYRINGE | INTRAVENOUS | Status: DC | PRN
  Administered 2017-03-28: 10 mg via INTRAVENOUS
  Administered 2017-03-28: 50 mg via INTRAVENOUS

## 2017-03-28 MED ORDER — SUGAMMADEX SODIUM 200 MG/2ML IV SOLN
INTRAVENOUS | Status: DC | PRN
  Administered 2017-03-28: 200 mg via INTRAVENOUS

## 2017-03-28 MED ORDER — HYDROMORPHONE HCL 1 MG/ML IJ SOLN
0.2500 mg | INTRAMUSCULAR | Status: DC | PRN
  Administered 2017-03-28: 0.25 mg via INTRAVENOUS

## 2017-03-28 MED ORDER — ONDANSETRON HCL 4 MG/2ML IJ SOLN
INTRAMUSCULAR | Status: DC | PRN
  Administered 2017-03-28: 4 mg via INTRAVENOUS

## 2017-03-28 MED ORDER — PROPOFOL 10 MG/ML IV BOLUS
INTRAVENOUS | Status: DC | PRN
  Administered 2017-03-28: 140 mg via INTRAVENOUS

## 2017-03-28 MED ORDER — LIDOCAINE 2% (20 MG/ML) 5 ML SYRINGE
INTRAMUSCULAR | Status: DC | PRN
  Administered 2017-03-28: 100 mg via INTRAVENOUS

## 2017-03-28 MED ORDER — FENTANYL CITRATE (PF) 100 MCG/2ML IJ SOLN
INTRAMUSCULAR | Status: DC | PRN
  Administered 2017-03-28: 25 ug via INTRAVENOUS

## 2017-03-28 MED ORDER — FENTANYL CITRATE (PF) 100 MCG/2ML IJ SOLN
INTRAMUSCULAR | Status: AC
  Filled 2017-03-28: qty 4

## 2017-03-28 MED ORDER — SODIUM CHLORIDE 0.9 % IV SOLN
INTRAVENOUS | Status: DC | PRN
  Administered 2017-03-28: 11:00:00 via INTRAVENOUS

## 2017-03-28 MED ORDER — CEFAZOLIN SODIUM-DEXTROSE 2-4 GM/100ML-% IV SOLN
INTRAVENOUS | Status: AC
  Filled 2017-03-28: qty 100

## 2017-03-28 MED ORDER — KETOROLAC TROMETHAMINE 30 MG/ML IJ SOLN: 30.0000 mg | Freq: Once | INTRAMUSCULAR | Status: DC | PRN

## 2017-03-28 MED ORDER — LACTATED RINGERS IV SOLN
INTRAVENOUS | Status: DC
  Administered 2017-03-28 (×2): via INTRAVENOUS

## 2017-03-28 MED ORDER — IOPAMIDOL (ISOVUE-370) INJECTION 76%
INTRAVENOUS | Status: AC
  Filled 2017-03-28: qty 100

## 2017-03-28 MED ORDER — IOPAMIDOL (ISOVUE-370) INJECTION 76%
100.0000 mL | Freq: Once | INTRAVENOUS | Status: AC | PRN
  Administered 2017-03-28: 70 mL via INTRAVENOUS

## 2017-03-28 MED ORDER — DOCUSATE SODIUM 100 MG PO CAPS
100.0000 mg | ORAL_CAPSULE | Freq: Two times a day (BID) | ORAL | Status: DC
  Administered 2017-03-28 – 2017-03-31 (×6): 100 mg via ORAL
  Filled 2017-03-28 (×7): qty 1

## 2017-03-28 MED ORDER — SODIUM CHLORIDE 0.9 % IV SOLN
INTRAVENOUS | Status: DC
  Administered 2017-03-28: 23:00:00 via INTRAVENOUS

## 2017-03-28 MED ORDER — CEFAZOLIN SODIUM-DEXTROSE 2-4 GM/100ML-% IV SOLN
2.0000 g | INTRAVENOUS | Status: AC
  Administered 2017-03-28: 2 g via INTRAVENOUS

## 2017-03-28 MED ORDER — HYDROMORPHONE HCL 1 MG/ML IJ SOLN
INTRAMUSCULAR | Status: AC
  Filled 2017-03-28: qty 1

## 2017-03-28 MED ORDER — PROMETHAZINE HCL 25 MG/ML IJ SOLN: 6.2500 mg | INTRAMUSCULAR | Status: DC | PRN

## 2017-03-28 MED ORDER — DEXAMETHASONE SODIUM PHOSPHATE 10 MG/ML IJ SOLN
INTRAMUSCULAR | Status: DC | PRN
  Administered 2017-03-28: 10 mg via INTRAVENOUS

## 2017-03-28 MED ORDER — FENTANYL CITRATE (PF) 100 MCG/2ML IJ SOLN
INTRAMUSCULAR | Status: AC
  Filled 2017-03-28: qty 2

## 2017-03-28 MED ORDER — HYDROCODONE-ACETAMINOPHEN 5-325 MG PO TABS
1.0000 | ORAL_TABLET | ORAL | Status: DC | PRN
  Administered 2017-03-28 – 2017-03-30 (×5): 1 via ORAL
  Filled 2017-03-28 (×5): qty 1

## 2017-03-28 MED ORDER — PROMETHAZINE HCL 25 MG/ML IJ SOLN
INTRAMUSCULAR | Status: AC
  Administered 2017-03-28: 6.25 mg
  Filled 2017-03-28: qty 1

## 2017-03-28 MED ORDER — MEPERIDINE HCL 50 MG/ML IJ SOLN
INTRAMUSCULAR | Status: AC
  Administered 2017-03-28: 6.5 mg via INTRAVENOUS
  Filled 2017-03-28: qty 1

## 2017-03-28 MED ORDER — MEPERIDINE HCL 50 MG/ML IJ SOLN
6.2500 mg | INTRAMUSCULAR | Status: DC | PRN
  Administered 2017-03-28: 6.25 mg via INTRAVENOUS

## 2017-03-28 MED ORDER — SENNOSIDES-DOCUSATE SODIUM 8.6-50 MG PO TABS
1.0000 | ORAL_TABLET | Freq: Every day | ORAL | Status: DC | PRN
  Filled 2017-03-28 (×2): qty 1

## 2017-03-28 MED ORDER — SUCCINYLCHOLINE CHLORIDE 200 MG/10ML IV SOSY
PREFILLED_SYRINGE | INTRAVENOUS | Status: DC | PRN
  Administered 2017-03-28: 100 mg via INTRAVENOUS

## 2017-03-28 MED ORDER — ONDANSETRON HCL 4 MG/2ML IJ SOLN
4.0000 mg | Freq: Four times a day (QID) | INTRAMUSCULAR | Status: DC | PRN
  Administered 2017-03-28 – 2017-03-31 (×2): 4 mg via INTRAVENOUS
  Filled 2017-03-28 (×3): qty 2

## 2017-03-28 NOTE — H&P (Signed)
Chief Complaint: left renal mass  Referring Physician:Dr. Garret Reddish  Supervising Physician: Aletta Edouard  Patient Status: [facility] - Out-pt  HPI: [name] [name] is a 83 y.o. female who was having some right sided abdominal pain in September of this year.  She was incidentally found to have a 3.2cm enhancing mass of the left lower kidney.  She was not found to have metastatic disease on further imaging.  She was referred to Dr. Kathlene Cote to evaluate for an ablation procedure.  Please see his HPI for other details.  Her last dose of eliquis was 12-15.  Past Medical History:  Past Medical History:  Diagnosis Date  . Atrial fibrillation ([place]) 10/06/2007   post op  . COPD (chronic obstructive pulmonary disease) ([place])    Emphysema on 02/03/13 CXR  . CORONARY ARTERY DISEASE 10/06/2007   a. s/p PCI to LAD; b. s/p CABG; c. LHC 07/13/12: Proximal LAD stent patent, LIMA-LAD atretic, D1 occluded, proximal circumflex 30%, mid RCA occluded, SVG-D1 normal, SVG-OM2 normal, SVG-distal RCA normal, EF 40% with diffuse HK  . Dizziness   . History of colonic polyps 10/30/2009   No polyps in 2011. No repeat.    Marland Kitchen HYPERLIPIDEMIA 10/06/2007  . HYPERTENSION 10/06/2007  . LBBB 09/06/2008  . Left renal mass   . MELANOMA 09/06/2008   MOES PROCEDURE RIGHT  . MYOCARDIAL INFARCTION, HX OF 10/06/2007  . NICM (nonischemic cardiomyopathy) ([place])    Echocardiogram 07/12/12: EF 25-30%, diffuse HK, mild AI, mild MR, mild LAE  . PERSONAL HX COLONIC POLYPS 10/30/2009  . Presence of permanent cardiac pacemaker   . Syncope   . UTERINE CANCER, HX OF 09/06/2008  . VARICOSE VEIN 09/29/2009    Past Surgical History:  Past Surgical History:  Procedure Laterality Date  . ABDOMINAL HYSTERECTOMY  1988  . BI-VENTRICULAR PACEMAKER INSERTION (CRT-P)  02/02/2013   St. Jude, serial no. #[id number]   . CARDIAC CATHETERIZATION  08/30/2008  . CORONARY ANGIOPLASTY WITH STENT PLACEMENT  2009  . CORONARY ARTERY BYPASS GRAFT  2004  . IR  RADIOLOGIST EVAL & MGMT  02/08/2017  . LEFT HEART CATHETERIZATION WITH CORONARY ANGIOGRAM Bilateral 07/13/2012   Procedure: LEFT HEART CATHETERIZATION WITH CORONARY ANGIOGRAM;  Surgeon: Peter M Martinique, MD;  Location: [facility] CATH LAB;  Service: Cardiovascular;  Laterality: Bilateral;  . OOPHORECTOMY    . PERMANENT PACEMAKER INSERTION N/A 02/02/2013   Procedure: PERMANENT PACEMAKER INSERTION;  Surgeon: Evans Lance, MD;  Location: [facility] CATH LAB;  Service: Cardiovascular;  Laterality: N/A;  . right distal pretibeal     melanoma    Family History:  Family History  Problem Relation Age of Onset  . Stroke Mother   . Heart attack Father   . Colon cancer Neg Hx     Social History:  reports that  has never smoked. she has never used smokeless tobacco. She reports that she drinks alcohol. She reports that she does not use drugs.  Allergies:  Allergies  Allergen Reactions  . Aricept [Donepezil] Shortness Of Breath and Other (See Comments)    Hallucinations and loss of sensation in legs  . Nitroglycerin Other (See Comments)    Oral spray form causes rapid drop in blood pressure.      Medications: Medications reviewed in epic  Please HPI for pertinent positives, otherwise complete 10 system ROS negative.  Mallampati Score: MD Evaluation Airway: WNL Heart: Other (comments) Heart  comments: pacemaker Abdomen: WNL Chest/ Lungs: WNL ASA  Classification: 3  Physical Exam: BP Marland Kitchen)  153/60   Pulse (!) 44   Temp 98.2 F (36.8 C) (Oral)   Resp 16   Ht 5\' 8"  (1.727 m)   Wt 130 lb (59 kg)   SpO2 99%   BMI 19.77 kg/m  Body mass index is 19.77 kg/m. General: pleasant, forgetful, WD, WN white female who is laying in bed in NAD HEENT: head is normocephalic, atraumatic.  Sclera are noninjected.  PERRL.  Ears and nose without any masses or lesions.  Mouth is pink and moist Heart: regular, rate, and rhythm, some ectopic beats.  Normal s1,s2. No obvious murmurs, gallops, or rubs noted.  Palpable  radial and pedal pulses bilaterally.  Pacemaker in place in [place]. Lungs: CTAB, no wheezes, rhonchi, or rales noted.  Respiratory effort nonlabored Abd: soft, NT, ND, +BS, no masses, hernias, or organomegaly Psych: A&Ox2 with an appropriate affect.  She is unable to recollect the year.  She thinks it may be 2006.   Labs: Labs reviewed from preop visit  Imaging: No results found.  Assessment/Plan 1. Left renal mass  We will plan to proceed today with a left renal cryoablation.  This was discussed in detail with the patient as well as her daughter at their prior visit.  The patient has no questions today and is ready to proceed.   Thank you for this interesting consult.  I greatly enjoyed meeting [name] [name] and look forward to participating in their care.  A copy of this report was sent to the requesting provider on this date.  Electronically Signed: Henreitta Cea 03/28/2017, 8:43 AM   I spent a total of    25 Minutes in face to face in clinical consultation, greater than 50% of which was counseling/coordinating care for left renal mass

## 2017-03-28 NOTE — Transfer of Care (Signed)
Immediate Anesthesia Transfer of Care Note  Patient: [name] [name]  Procedure(s) Performed: RENAL CRYO ABLATION (Left )  Patient Location: PACU  Anesthesia Type:General  Level of Consciousness: sedated  Airway & Oxygen Therapy: Patient Spontanous Breathing and Patient connected to face mask oxygen  Post-op Assessment: Report given to RN and Post -op Vital signs reviewed and stable  Post vital signs: Reviewed and stable  Last Vitals:  Vitals:   03/28/17 0640  BP: (!) 153/60  Pulse: (!) 44  Resp: 16  Temp: 36.8 C  SpO2: 99%    Last Pain:  Vitals:   03/28/17 0640  TempSrc: Oral         Complications: No apparent anesthesia complications

## 2017-03-28 NOTE — Anesthesia Procedure Notes (Signed)
Procedure Name: Intubation Date/Time: 03/28/2017 8:47 AM Performed by: Lind Covert, CRNA Pre-anesthesia Checklist: Patient identified, Emergency Drugs available, Suction available and Patient being monitored Patient Re-evaluated:Patient Re-evaluated prior to induction Oxygen Delivery Method: Circle system utilized Preoxygenation: Pre-oxygenation with 100% oxygen Induction Type: IV induction Ventilation: Mask ventilation without difficulty Laryngoscope Size: 4 and Mac Grade View: Grade I Tube type: Oral Tube size: 7.0 mm Number of attempts: 2 Airway Equipment and Method: Stylet and Bougie stylet Placement Confirmation: ETT inserted through vocal cords under direct vision,  positive ETCO2 and breath sounds checked- equal and bilateral Secured at: 22 cm Tube secured with: Tape Dental Injury: Teeth and Oropharynx as per pre-operative assessment  Difficulty Due To: Difficulty was unanticipated and Difficult Airway- due to anterior larynx Future Recommendations: Recommend- induction with short-acting agent, and alternative techniques readily available Comments: Esophageal intubation x 1, anterior larynx, floppy epiglottis. Removed. Able to ventilate patient easily. Able to use bougie with second pass without difficulty. +ETCO2, + BBS

## 2017-03-28 NOTE — Procedures (Signed)
Interventional Radiology Procedure Note  Procedure: CT guided biopsy and cryoablation of left renal mass  Anesthesia: General  Contrast: 70 mL Isovue 958 IV  Complications: Perinephric hemorrhage  Estimated Blood Loss: 50 mL  Findings: 3 x 3.5 cm left renal mass sampled via 17 G needle with single 18 G core biopsy sample. Cryoablation performed via 3 [employer] CX probes. Posterior perinephric hemorrhage posterior to mass during procedure. Will follow H&H.  [name] [name]. Kathlene Cote, M.D Pager:  [phone]

## 2017-03-28 NOTE — Sedation Documentation (Signed)
Anesthesia in to sedate and monitor patient 

## 2017-03-28 NOTE — Progress Notes (Signed)
Patient is s/p left renal cryoablation this morning.  She is sleepy, but doing well this afternoon.  She is a bit nauseated currently and just had some more zofran.  She states she needs to have a BM and feels like she needs to void despite her foley being in place.  PE: Back: site is c/d/i with no bleeding.  Slightly tender to palpation Abd: no abdominal pain GU: foley catheter in place with clear yellow urine.  Minimal bloody debride at the bottom of the collecting back, but urine in the tubing and in the collecting chambers is clear.  A/P: 1. S/p left renal cryoablation with perinephric hemorrhage  Patient is doing well after her procedure.  She is slow to wake up which isn't unexpected given her age.  Continue to control nausea with zofran.  Advance diet as she tolerates. Will likely be able to DC foley tonight as her urine is clear right now.  Dr. Kathlene Cote will see her in a little bit and make final decision on this.  Otherwise, if she is doing well, will plan for DC home in the am.  Henreitta Cea 4:45 PM 03/28/2017

## 2017-03-29 ENCOUNTER — Encounter (HOSPITAL_COMMUNITY): Payer: Self-pay | Admitting: Interventional Radiology

## 2017-03-29 ENCOUNTER — Observation Stay (HOSPITAL_COMMUNITY): Payer: Medicare Other

## 2017-03-29 DIAGNOSIS — Z955 Presence of coronary angioplasty implant and graft: Secondary | ICD-10-CM | POA: Diagnosis not present

## 2017-03-29 DIAGNOSIS — I255 Ischemic cardiomyopathy: Secondary | ICD-10-CM

## 2017-03-29 DIAGNOSIS — D62 Acute posthemorrhagic anemia: Secondary | ICD-10-CM

## 2017-03-29 DIAGNOSIS — Z90710 Acquired absence of both cervix and uterus: Secondary | ICD-10-CM | POA: Diagnosis not present

## 2017-03-29 DIAGNOSIS — I959 Hypotension, unspecified: Secondary | ICD-10-CM | POA: Diagnosis present

## 2017-03-29 DIAGNOSIS — N2889 Other specified disorders of kidney and ureter: Secondary | ICD-10-CM | POA: Diagnosis present

## 2017-03-29 DIAGNOSIS — Z951 Presence of aortocoronary bypass graft: Secondary | ICD-10-CM | POA: Diagnosis not present

## 2017-03-29 DIAGNOSIS — S37012A Minor contusion of left kidney, initial encounter: Secondary | ICD-10-CM | POA: Diagnosis present

## 2017-03-29 DIAGNOSIS — J449 Chronic obstructive pulmonary disease, unspecified: Secondary | ICD-10-CM | POA: Diagnosis present

## 2017-03-29 DIAGNOSIS — S37092A Other injury of left kidney, initial encounter: Secondary | ICD-10-CM

## 2017-03-29 DIAGNOSIS — N179 Acute kidney failure, unspecified: Secondary | ICD-10-CM | POA: Diagnosis present

## 2017-03-29 DIAGNOSIS — Y848 Other medical procedures as the cause of abnormal reaction of the patient, or of later complication, without mention of misadventure at the time of the procedure: Secondary | ICD-10-CM | POA: Diagnosis present

## 2017-03-29 DIAGNOSIS — F329 Major depressive disorder, single episode, unspecified: Secondary | ICD-10-CM | POA: Diagnosis present

## 2017-03-29 DIAGNOSIS — Z79899 Other long term (current) drug therapy: Secondary | ICD-10-CM | POA: Diagnosis not present

## 2017-03-29 DIAGNOSIS — I110 Hypertensive heart disease with heart failure: Secondary | ICD-10-CM | POA: Diagnosis present

## 2017-03-29 DIAGNOSIS — Z7901 Long term (current) use of anticoagulants: Secondary | ICD-10-CM | POA: Diagnosis not present

## 2017-03-29 DIAGNOSIS — I252 Old myocardial infarction: Secondary | ICD-10-CM | POA: Diagnosis not present

## 2017-03-29 DIAGNOSIS — Z8249 Family history of ischemic heart disease and other diseases of the circulatory system: Secondary | ICD-10-CM | POA: Diagnosis not present

## 2017-03-29 DIAGNOSIS — N3289 Other specified disorders of bladder: Secondary | ICD-10-CM | POA: Diagnosis present

## 2017-03-29 DIAGNOSIS — I2510 Atherosclerotic heart disease of native coronary artery without angina pectoris: Secondary | ICD-10-CM | POA: Diagnosis present

## 2017-03-29 DIAGNOSIS — I10 Essential (primary) hypertension: Secondary | ICD-10-CM

## 2017-03-29 DIAGNOSIS — N3281 Overactive bladder: Secondary | ICD-10-CM | POA: Diagnosis present

## 2017-03-29 DIAGNOSIS — Y92239 Unspecified place in hospital as the place of occurrence of the external cause: Secondary | ICD-10-CM | POA: Diagnosis present

## 2017-03-29 DIAGNOSIS — I480 Paroxysmal atrial fibrillation: Secondary | ICD-10-CM | POA: Diagnosis present

## 2017-03-29 DIAGNOSIS — R413 Other amnesia: Secondary | ICD-10-CM | POA: Diagnosis present

## 2017-03-29 DIAGNOSIS — I5042 Chronic combined systolic (congestive) and diastolic (congestive) heart failure: Secondary | ICD-10-CM

## 2017-03-29 DIAGNOSIS — Z950 Presence of cardiac pacemaker: Secondary | ICD-10-CM | POA: Diagnosis not present

## 2017-03-29 HISTORY — DX: Other injury of left kidney, initial encounter: S37.092A

## 2017-03-29 HISTORY — DX: Acute posthemorrhagic anemia: D62

## 2017-03-29 LAB — HEMOGLOBIN AND HEMATOCRIT, BLOOD
HCT: 26.6 % — ABNORMAL LOW (ref 36.0–46.0)
HEMOGLOBIN: 8.7 g/dL — AB (ref 12.0–15.0)

## 2017-03-29 LAB — TYPE AND SCREEN
ABO/RH(D): A POS
ABO/RH(D): A POS
ANTIBODY SCREEN: NEGATIVE
ANTIBODY SCREEN: NEGATIVE

## 2017-03-29 LAB — CBC WITH DIFFERENTIAL/PLATELET
Basophils Absolute: 0 10*3/uL (ref 0.0–0.1)
Basophils Relative: 0 %
EOS ABS: 0 10*3/uL (ref 0.0–0.7)
EOS PCT: 0 %
HCT: 25.7 % — ABNORMAL LOW (ref 36.0–46.0)
Hemoglobin: 8.6 g/dL — ABNORMAL LOW (ref 12.0–15.0)
LYMPHS ABS: 1.2 10*3/uL (ref 0.7–4.0)
Lymphocytes Relative: 12 %
MCH: 30.3 pg (ref 26.0–34.0)
MCHC: 33.5 g/dL (ref 30.0–36.0)
MCV: 90.5 fL (ref 78.0–100.0)
Monocytes Absolute: 0.8 10*3/uL (ref 0.1–1.0)
Monocytes Relative: 8 %
Neutro Abs: 7.8 10*3/uL — ABNORMAL HIGH (ref 1.7–7.7)
Neutrophils Relative %: 80 %
PLATELETS: 91 10*3/uL — AB (ref 150–400)
RBC: 2.84 MIL/uL — AB (ref 3.87–5.11)
RDW: 14 % (ref 11.5–15.5)
WBC: 9.9 10*3/uL (ref 4.0–10.5)

## 2017-03-29 LAB — CBC
HEMATOCRIT: 30.1 % — AB (ref 36.0–46.0)
Hemoglobin: 9.7 g/dL — ABNORMAL LOW (ref 12.0–15.0)
MCH: 29 pg (ref 26.0–34.0)
MCHC: 32.2 g/dL (ref 30.0–36.0)
MCV: 90.1 fL (ref 78.0–100.0)
Platelets: 123 10*3/uL — ABNORMAL LOW (ref 150–400)
RBC: 3.34 MIL/uL — ABNORMAL LOW (ref 3.87–5.11)
RDW: 13.6 % (ref 11.5–15.5)
WBC: 12.7 10*3/uL — ABNORMAL HIGH (ref 4.0–10.5)

## 2017-03-29 LAB — BASIC METABOLIC PANEL
Anion gap: 6 (ref 5–15)
BUN: 20 mg/dL (ref 6–20)
CALCIUM: 8 mg/dL — AB (ref 8.9–10.3)
CO2: 27 mmol/L (ref 22–32)
CREATININE: 1.28 mg/dL — AB (ref 0.44–1.00)
Chloride: 105 mmol/L (ref 101–111)
GFR, EST AFRICAN AMERICAN: 44 mL/min — AB (ref >60)
GFR, EST NON AFRICAN AMERICAN: 38 mL/min — AB (ref >60)
GLUCOSE: 135 mg/dL — AB (ref 65–99)
Potassium: 4.1 mmol/L (ref 3.5–5.1)
Sodium: 138 mmol/L (ref 135–145)

## 2017-03-29 LAB — DIFFERENTIAL
BASOS PCT: 0 %
Basophils Absolute: 0 10*3/uL (ref 0.0–0.1)
EOS ABS: 0 10*3/uL (ref 0.0–0.7)
EOS PCT: 0 %
Lymphocytes Relative: 8 %
Lymphs Abs: 0.9 10*3/uL (ref 0.7–4.0)
MONO ABS: 1 10*3/uL (ref 0.1–1.0)
Monocytes Relative: 8 %
Neutro Abs: 10.1 10*3/uL — ABNORMAL HIGH (ref 1.7–7.7)
Neutrophils Relative %: 84 %

## 2017-03-29 MED ORDER — ACETAMINOPHEN 325 MG PO TABS
650.0000 mg | ORAL_TABLET | Freq: Four times a day (QID) | ORAL | Status: DC | PRN
  Administered 2017-03-29 – 2017-03-30 (×2): 650 mg via ORAL
  Filled 2017-03-29 (×2): qty 2

## 2017-03-29 MED ORDER — MIRABEGRON ER 25 MG PO TB24
25.0000 mg | ORAL_TABLET | Freq: Every day | ORAL | Status: DC
  Administered 2017-03-29: 25 mg via ORAL
  Filled 2017-03-29: qty 1

## 2017-03-29 MED ORDER — LISINOPRIL 5 MG PO TABS
2.5000 mg | ORAL_TABLET | Freq: Every day | ORAL | Status: DC
  Administered 2017-03-29: 2.5 mg via ORAL
  Filled 2017-03-29: qty 1

## 2017-03-29 MED ORDER — PYRIDOSTIGMINE BROMIDE 60 MG PO TABS
60.0000 mg | ORAL_TABLET | Freq: Two times a day (BID) | ORAL | Status: DC
  Administered 2017-03-29 – 2017-03-31 (×5): 60 mg via ORAL
  Filled 2017-03-29 (×6): qty 1

## 2017-03-29 MED ORDER — ESCITALOPRAM OXALATE 20 MG PO TABS
20.0000 mg | ORAL_TABLET | Freq: Every day | ORAL | Status: DC
  Administered 2017-03-29 – 2017-03-30 (×2): 20 mg via ORAL
  Filled 2017-03-29 (×2): qty 1

## 2017-03-29 NOTE — Anesthesia Postprocedure Evaluation (Signed)
Anesthesia Post Note  Patient: [name] [name]  Procedure(s) Performed: RENAL CRYO ABLATION (Left )     Patient location during evaluation: PACU Anesthesia Type: General Level of consciousness: awake and sedated Pain management: pain level controlled Vital Signs Assessment: post-procedure vital signs reviewed and stable Respiratory status: spontaneous breathing Cardiovascular status: stable Postop Assessment: no apparent nausea or vomiting Anesthetic complications: no    Last Vitals:  Vitals:   03/29/17 0446 03/29/17 1420  BP: (!) 133/57 (!) 108/45  Pulse: 76 79  Resp: 18 16  Temp: 37.1 C 37.6 C  SpO2: 100% 96%    Last Pain:  Vitals:   03/29/17 1420  TempSrc: Oral  PainSc:    Pain Goal: Patients Stated Pain Goal: 4 (03/28/17 1245)               [place]

## 2017-03-29 NOTE — Care Management Obs Status (Signed)
[place] NOTIFICATION   Patient Details  Name: Lilliona Blakeney MRN: [id number] Date of Birth: [birth year]   Medicare Observation Status Notification Given:  Yes    Lynnell Catalan, RN 03/29/2017, 1:30 PM

## 2017-03-29 NOTE — Progress Notes (Signed)
Referring Physician(s): [name] [name],S  Supervising Physician: [name] [name]  Patient Status:  [name] - In-pt  Chief Complaint:  Left renal mass, abdominal pain, perinephric hematoma post cryoablation/biopsy 12/17  Subjective: Pt c/o abd/back/bil thigh discomfort; has voided and eaten without difficulty; denies N/V   Allergies: Aricept [donepezil] and Nitroglycerin  Medications: Prior to Admission medications   Medication Sig Start Date End Date Taking? Authorizing Provider  ELIQUIS 5 MG TABS tablet TAKE 1 TABLET BY MOUTH TWICE DAILY 05/13/16  Yes [name] Sprang, MD  escitalopram (LEXAPRO) 20 MG tablet TAKE 1 TABLET(20 MG) BY MOUTH DAILY Patient taking differently: TAKE 1 TABLET(20 MG) BY MOUTH at bedtime 03/07/17  Yes [name] Olp, MD  lisinopril (PRINIVIL,ZESTRIL) 2.5 MG tablet TAKE 1 TABLET(2.5 MG) BY MOUTH DAILY Patient taking differently: TAKE 1 TABLET(2.5 MG) BY MOUTH at bedtime 05/13/16  Yes [name] Sprang, MD  mirabegron ER (MYRBETRIQ) 25 MG TB24 tablet Take 1 tablet (25 mg total) by mouth daily. 11/04/16  Yes [name] Olp, MD  pyridostigmine (MESTINON) 60 MG tablet TAKE 1 TABLET(60 MG) BY MOUTH TWICE DAILY 10/27/16  Yes [name] Olp, MD     Vital Signs: BP (!) 108/45 (BP Location: Left Arm)   Pulse 79   Temp 99.6 F (37.6 C) (Oral)   Resp 16   Ht 5\' 8"  (1.727 m)   Wt 130 lb (59 kg)   SpO2 96%   BMI 19.77 kg/m   Physical Exam awake/alert; chest- CTA bilat; heart- RRR; abd soft,+BS, mildly tender pelvic /left flank regions; puncture left flank soft, no visible bleeding  Imaging: Ct Guide Tissue Ablation  Result Date: 03/28/2017 CLINICAL DATA:  3.4 cm mass of the left kidney with imaging characteristics consistent with renal carcinoma. The patient presents for biopsy and ablation under CT guidance. EXAM: CT-GUIDED CORE BIOPSY OF LEFT RENAL MASS CT-GUIDED PERCUTANEOUS CRYOABLATION OF LEFT RENAL MASS ANESTHESIA/SEDATION: General MEDICATIONS: 2 g IV Ancef. The  antibiotic was administered in an appropriate time interval prior to needle puncture of the skin. CONTRAST:  12mL ISOVUE-370 IOPAMIDOL (ISOVUE-370) INJECTION 76% PROCEDURE: The procedure, risks, benefits, and alternatives were explained to the patient. Questions regarding the procedure were encouraged and answered. The patient understands and consents to the procedure. A time-out was performed prior to initiating the procedure. The patient was placed under general anesthesia. Initial unenhanced CT was performed in a prone position to localize the left kidney. The patient was prepped with chlorhexidine in a sterile fashion, and a sterile drape was applied covering the operative field. A sterile gown and sterile gloves were used for the procedure. Contrast enhanced CT was performed of the kidneys with administration of IV contrast. A 17 gauge trocar needle was advanced into the left renal mass. Core biopsy was performed with an 18 gauge automated core biopsy device. A single core biopsy sample was submitted in formalin for pathologic analysis. After biopsy, a slurry of Gel-Foam pledgets mixed in sterile saline was injected through the 17 gauge needle as it was removed. Under CT guidance, a series of 3 separate [employer] CX percutaneous cryoablation probes were advanced into the left renal mass. Probe positioning was confirmed by CT prior to cryoablation. Cryoablation was performed through the 3 probes simultaneously. Initial 10 minute cycle of cryoablation was performed. This was followed by a 8 minute thaw cycle. A second 10 minute cycle of cryoablation was then performed. During ablation, periodic CT imaging was performed to monitor ice ball formation and morphology. After active  thaw, a 30 second cautery cycle was performed and the cryoablation probes were then removed. COMPLICATIONS: Perinephric hemorrhage and hematuria. SIR level A: No therapy, no consequence. FINDINGS: Posterior mid to lower left renal  mass was localized and after administration of contrast demonstrates avid enhancement. The mass measures approximately 3 x 3.5 cm in greatest transverse dimensions. Core biopsy yielded solid tissue. During biopsy and cryoablation probe placement, there was some perinephric bleeding present posterior to the kidney. Once ablation was begun, the amount of bleeding did not appear to increase significantly. During ablation, CT demonstrates adequate ice ball formation that appears to encompass the margins of the tumor. During the second freeze, it did become evident that there was development of some hematuria in the Foley catheter drainage bag. This will be followed. IMPRESSION: CT guided core biopsy and cryoablation of 3.5 cm left renal mass. The procedure was complicated by posterior perinephric hemorrhage and hematuria. The patient will be observed overnight. Initial follow-up will be performed in approximately 4 weeks. Electronically Signed   By: [name] [name] M.D.   On: 03/28/2017 11:53    Labs:  CBC: Recent Labs    01/31/17 1505 03/22/17 1217 03/29/17 0337 03/29/17 1420  WBC 6.4 7.7 12.7* 9.9  HGB 12.5 13.2 9.7* 8.6*  HCT 37.8 40.7 30.1* 25.7*  PLT 131* 135* 123* 91*    COAGS: Recent Labs    03/22/17 1217  INR 1.10    BMP: Recent Labs    04/03/16 1556 04/21/16 1219 12/29/16 1551 03/22/17 1217 03/29/17 0337  NA 137 138 140 140 138  K 3.7 3.8 4.1 4.0 4.1  CL 105 101 103 103 105  CO2 25 32 32 29 27  GLUCOSE 87 90 107* 85 135*  BUN 11 9 20 13 20   CALCIUM 8.6* 9.4 8.7 9.0 8.0*  CREATININE 0.78 0.75 0.86 0.72 1.28*  GFRNONAA >60  --   --  >60 38*  GFRAA >60  --   --  >60 44*    LIVER FUNCTION TESTS: No results for input(s): BILITOT, AST, ALT, ALKPHOS, PROT, ALBUMIN in the last 8760 hours.  Assessment and Plan: Pt with hx left renal mass, s/p bx/cryoablation 03/28/17 with subsequent perinephric hematoma; temp 99.6, hgb 8.6(9.7), plts 91k, creat 1.28, WBC 9.9; last urine  amber with small clot strand per nurse; with hgb drop today will recheck noncontrasted CT A/P; cont IV hydration; serial hgb checks; Drs. [name] [name]/[name] [name] updated   Electronically Signed: D. Rowe Robert, PA-C 03/29/2017, 3:56 PM   I spent a total of 20 minutes at the the patient's bedside AND on the patient's hospital floor or unit, greater than 50% of which was counseling/coordinating care for left renal mass biopsy/cryoablation    Patient ID: [name] [name], female   DOB: [birth year], 83 y.o.   MRN: [id number]

## 2017-03-29 NOTE — H&P (Signed)
History and Physical    [name] [name] IRS:[id number] DOB: 16-Jan-1932 DOA: 03/28/2017  Referring MD/NP/PA: Kathlene Cote PCP: Marin Olp, MD  Outpatient Specialists: Dr. Caryl Comes, cardiology, Dr. Lamonte Sakai, pulmonology  Patient coming from: home  Chief Complaint: left renal mass  HPI: [name] [name] is a 83 y.o. female with history of atrial fibrillation, coronary artery disease s/p CABG, sCHF with EF of 25-30%, hypertension, memory loss, history of uterine cancer, and more recently diagnosed left renal mass.  The patient was incidentally found to have a 3.2 cm enhancing mass of the left lower kidney in September of this year.  She did not have any metastatic disease on imaging and was referred for an ablation by radiology.  She presented on 12/17 and underwent left renal cryoablation.  Postprocedure she developed a perinephric hematoma.  Her baseline hemoglobin is 12-13 g/dL.  Postprocedure it dropped to 9.7 g/dL and is now down to 8.6 g/dL.  Repeat CT scan on 12/18 showed that the hematoma has increased compared to yesterday.  She has developed some dizziness, fatigue, lower extremity weakness and lower abdominal pressure since her procedure.  We were consulted by IR to assist with her postoperative complication given her multiple comorbidities.  Patient's family states that she stopped her Eliquis 2 days prior to surgery and she did not take any aspirin or ibuprofen in the subsequent days.  Review of Systems:  General:  Denies fevers, chills, weight change HEENT:  Denies changes to hearing and vision, sinus congestion, sore throat CV:  Denies chest pain, palpitations, lower extremity edema.  PULM:  Denies SOB, cough.   GI:  Denies nausea, vomiting, constipation, diarrhea.  She feels hungry GU:  Denies dysuria, frequency, urgency.  Has suprapubic pressure and only voided twice today ENDO:  Denies polyuria, polydipsia.   HEME:  Denies blood in stools, abnormal bruising or bleeding.  LYMPH:  Denies  lymphadenopathy.   MSK:  Having arthralgias, myalgias in lower abdomen and bilateral anterior hips DERM:  Denies skin rash or ulcer.   NEURO:  New bilateral lower extremity weakness, no slurred speech, increased confusion PSYCH:  Denies anxiety and depression.    Past Medical History:  Diagnosis Date  . Atrial fibrillation ([place]) 10/06/2007   post op  . COPD (chronic obstructive pulmonary disease) ([place])    Emphysema on 02/03/13 CXR  . CORONARY ARTERY DISEASE 10/06/2007   a. s/p PCI to LAD; b. s/p CABG; c. LHC 07/13/12: Proximal LAD stent patent, LIMA-LAD atretic, D1 occluded, proximal circumflex 30%, mid RCA occluded, SVG-D1 normal, SVG-OM2 normal, SVG-distal RCA normal, EF 40% with diffuse HK  . Dizziness   . History of colonic polyps 10/30/2009   No polyps in 2011. No repeat.    Marland Kitchen HYPERLIPIDEMIA 10/06/2007  . HYPERTENSION 10/06/2007  . LBBB 09/06/2008  . Left renal mass   . MELANOMA 09/06/2008   MOES PROCEDURE RIGHT  . MYOCARDIAL INFARCTION, HX OF 10/06/2007  . NICM (nonischemic cardiomyopathy) ([place])    Echocardiogram 07/12/12: EF 25-30%, diffuse HK, mild AI, mild MR, mild LAE  . PERSONAL HX COLONIC POLYPS 10/30/2009  . Presence of permanent cardiac pacemaker   . Syncope   . UTERINE CANCER, HX OF 09/06/2008  . VARICOSE VEIN 09/29/2009    Past Surgical History:  Procedure Laterality Date  . ABDOMINAL HYSTERECTOMY  1988  . BI-VENTRICULAR PACEMAKER INSERTION (CRT-P)  02/02/2013   St. Jude, serial no. #[id number]   . CARDIAC CATHETERIZATION  08/30/2008  . CORONARY ANGIOPLASTY WITH STENT PLACEMENT  2009  .  CORONARY ARTERY BYPASS GRAFT  2004  . IR RADIOLOGIST EVAL & MGMT  02/08/2017  . LEFT HEART CATHETERIZATION WITH CORONARY ANGIOGRAM Bilateral 07/13/2012   Procedure: LEFT HEART CATHETERIZATION WITH CORONARY ANGIOGRAM;  Surgeon: Peter M Martinique, MD;  Location: [facility] CATH LAB;  Service: Cardiovascular;  Laterality: Bilateral;  . OOPHORECTOMY    . PERMANENT PACEMAKER INSERTION N/A 02/02/2013    Procedure: PERMANENT PACEMAKER INSERTION;  Surgeon: Evans Lance, MD;  Location: [facility] C[facility] LAB;  Service: Cardiovascular;  Laterality: N/A;  . RADIOLOGY WITH ANESTHESIA Left 03/28/2017   Procedure: RENAL CRYO ABLATION;  Surgeon: Aletta Edouard, MD;  Location: [facility] ORS;  Service: Radiology;  Laterality: Left;  . right distal pretibeal     melanoma     reports that  has never smoked. she has never used smokeless tobacco. She reports that she drinks alcohol. She reports that she does not use drugs.  Allergies  Allergen Reactions  . Aricept [Donepezil] Shortness Of Breath and Other (See Comments)    Hallucinations and loss of sensation in legs  . Nitroglycerin Other (See Comments)    Oral spray form causes rapid drop in blood pressure.      Family History  Problem Relation Age of Onset  . Stroke Mother   . Heart attack Father   . Colon cancer Neg Hx     Prior to Admission medications   Medication Sig Start Date End Date Taking? Authorizing Provider  ELIQUIS 5 MG TABS tablet TAKE 1 TABLET BY MOUTH TWICE DAILY 05/13/16  Yes Deboraha Sprang, MD  escitalopram (LEXAPRO) 20 MG tablet TAKE 1 TABLET(20 MG) BY MOUTH DAILY Patient taking differently: TAKE 1 TABLET(20 MG) BY MOUTH at bedtime 03/07/17  Yes Marin Olp, MD  lisinopril (PRINIVIL,ZESTRIL) 2.5 MG tablet TAKE 1 TABLET(2.5 MG) BY MOUTH DAILY Patient taking differently: TAKE 1 TABLET(2.5 MG) BY MOUTH at bedtime 05/13/16  Yes Deboraha Sprang, MD  mirabegron ER (MYRBETRIQ) 25 MG TB24 tablet Take 1 tablet (25 mg total) by mouth daily. 11/04/16  Yes Marin Olp, MD  pyridostigmine (MESTINON) 60 MG tablet TAKE 1 TABLET(60 MG) BY MOUTH TWICE DAILY 10/27/16  Yes Marin Olp, MD    Physical Exam: Vitals:   03/28/17 1500 03/28/17 2043 03/29/17 0446 03/29/17 1420  BP: (!) 137/56 123/60 (!) 133/57 (!) 108/45  Pulse: 71 80 76 79  Resp: 18 18 18 16   Temp: (!) 97.5 F (36.4 C) 98.9 F (37.2 C) 98.8 F (37.1 C) 99.6 F (37.6 C)    TempSrc: Oral Oral Oral Oral  SpO2: 100% 100% 100% 96%  Weight:      Height:        Constitutional:  Adult female, NAD, calm, comfortable Eyes: PERRL, lids and conjunctivae normal ENMT:  Moist mucous membranes.  Oropharynx nonerythematous, no exudates.   Neck:  No nuchal rigidity, no masses Respiratory:  No wheezes, rales, or rhonchi Cardiovascular:  IRRR, no murmurs / rubs / gallops.  2+ radial pulses. Abdomen:  Normal active bowel sounds, soft, nondistended, mild TTP in the bilateral lower quadrants without rebound or guarding.  No left flank pain Musculoskeletal: Normal muscle tone and bulk.  No contractures.  Skin:  no rashes, abrasions, or ulcers Neurologic:  CN 2-12 grossly intact. Sensation intact to light touch, strength 5/5 throughout Psychiatric:  Alert and oriented to person, December, but initially not place or time or reason for hospitalization.  Slightly blunted affect.   Labs on Admission: I have personally reviewed following labs  and imaging studies  CBC: Recent Labs  Lab 03/29/17 0337 03/29/17 1420  WBC 12.7* 9.9  NEUTROABS 10.1* 7.8*  HGB 9.7* 8.6*  HCT 30.1* 25.7*  MCV 90.1 90.5  PLT 123* 91*   Basic Metabolic Panel: Recent Labs  Lab 03/29/17 0337  NA 138  K 4.1  CL 105  CO2 27  GLUCOSE 135*  BUN 20  CREATININE 1.28*  CALCIUM 8.0*   GFR: Estimated Creatinine Clearance: 31 mL/min (A) (by C-G formula based on SCr of 1.28 mg/dL (H)). Liver Function Tests: No results for input(s): AST, ALT, ALKPHOS, BILITOT, PROT, ALBUMIN in the last 168 hours. No results for input(s): LIPASE, AMYLASE in the last 168 hours. No results for input(s): AMMONIA in the last 168 hours. Coagulation Profile: No results for input(s): INR, PROTIME in the last 168 hours. Cardiac Enzymes: No results for input(s): CKTOTAL, CKMB, CKMBINDEX, TROPONINI in the last 168 hours. BNP (last 3 results) No results for input(s): PROBNP in the last 8760 hours. HbA1C: No results for  input(s): HGBA1C in the last 72 hours. CBG: No results for input(s): GLUCAP in the last 168 hours. Lipid Profile: No results for input(s): CHOL, HDL, LDLCALC, TRIG, CHOLHDL, LDLDIRECT in the last 72 hours. Thyroid Function Tests: No results for input(s): TSH, T4TOTAL, FREET4, T3FREE, THYROIDAB in the last 72 hours. Anemia Panel: No results for input(s): VITAMINB12, FOLATE, FERRITIN, TIBC, IRON, RETICCTPCT in the last 72 hours. Urine analysis:    Component Value Date/Time   COLORURINE YELLOW 12/29/2016 1558   APPEARANCEUR CLEAR 12/29/2016 1558   LABSPEC 1.020 12/29/2016 1558   PHURINE 6.5 12/29/2016 1558   GLUCOSEU NEGATIVE 12/29/2016 1558   HGBUR TRACE-INTACT (A) 12/29/2016 1558   BILIRUBINUR NEGATIVE 12/29/2016 1558   BILIRUBINUR negative 04/21/2016 1157   KETONESUR NEGATIVE 12/29/2016 1558   PROTEINUR negative 04/21/2016 1157   PROTEINUR NEGATIVE 07/15/2012 0400   UROBILINOGEN 0.2 12/29/2016 1558   NITRITE NEGATIVE 12/29/2016 1558   LEUKOCYTESUR NEGATIVE 12/29/2016 1558   Sepsis Labs: @LABRCNTIP (procalcitonin:4,lacticidven:4) )No results found for this or any previous visit (from the past 240 hour(s)).   Radiological Exams on Admission: Ct Abdomen Pelvis Wo Contrast  Result Date: 03/29/2017 CLINICAL DATA:  Status post left renal cryo ablation for mass yesterday. Drop in hemoglobin. EXAM: CT ABDOMEN AND PELVIS WITHOUT CONTRAST TECHNIQUE: Multidetector CT imaging of the abdomen and pelvis was performed following the standard protocol without IV contrast. COMPARISON:  Abdominal CT 01/11/2017 and abdominopelvic CT 12/30/2016. Images from biopsy 03/28/2017. FINDINGS: Lower chest: New small left-greater-than-right pleural effusions with mild dependent left lower lobe atelectasis. The heart is enlarged status post median sternotomy and pacemaker placement. Hepatobiliary: No ectatic abnormalities are identified on noncontrast imaging. Vicarious excretion of contrast is demonstrated  within the gallbladder lumen. No evidence of gallbladder wall thickening or biliary dilatation. Pancreas: Atrophied without evidence of acute hemorrhage, ductal dilatation or surrounding inflammation. Spleen: Normal in size without focal abnormality. Adrenals/Urinary Tract: Both adrenal glands appear normal. The right kidney appears unremarkable. Subcapsular and perinephric hematoma on the left has progressed from yesterday. There is a subcapsular component measuring up to 1.8 cm in thickness. Perinephric components extend into the left posterior pararenal space and inferiorly into the pelvis in the posterior perirectal space. There is a persistent nephrogram on the left which is disrupted by the underlying known mass in the lower pole. No active bleeding evident. There is no hydronephrosis. Contrast material is present in the urinary bladder. Stomach/Bowel: No evidence of bowel wall thickening, distention or surrounding  inflammatory change. Vascular/Lymphatic: There are no enlarged abdominal or pelvic lymph nodes. Aortic and branch vessel atherosclerosis. No acute vascular findings on noncontrast imaging. Reproductive: Hysterectomy. No evidence of adnexal mass. There is a small amount of free pelvic fluid. No generalized ascites or free air. Other: As above, inferior extension of perinephric hematoma into the perirectal fat. Small amount of free pelvic fluid. Musculoskeletal: No acute or significant osseous findings. IMPRESSION: 1. Progression of the known left renal subcapsular and perinephric hematoma since cryoablation procedure yesterday. Hemorrhage extends into the perirectal fat, and there is a small amount of free pelvic fluid. 2. Associated persistent nephrogram on the left.  No hydronephrosis. 3. Small bilateral pleural effusions with left lower lobe atelectasis. 4.  Aortic Atherosclerosis (ICD10-I70.0). 5. These results were called by telephone at the time of interpretation on 03/29/2017 at 5:00 pm to  [name] , who verbally acknowledged these results. Electronically Signed   By: Richardean Sale M.D.   On: 03/29/2017 17:13   Ct Guide Tissue Ablation  Result Date: 03/28/2017 CLINICAL DATA:  3.4 cm mass of the left kidney with imaging characteristics consistent with renal carcinoma. The patient presents for biopsy and ablation under CT guidance. EXAM: CT-GUIDED CORE BIOPSY OF LEFT RENAL MASS CT-GUIDED PERCUTANEOUS CRYOABLATION OF LEFT RENAL MASS ANESTHESIA/SEDATION: General MEDICATIONS: 2 g IV Ancef. The antibiotic was administered in an appropriate time interval prior to needle puncture of the skin. CONTRAST:  85mL ISOVUE-370 IOPAMIDOL (ISOVUE-370) INJECTION 76% PROCEDURE: The procedure, risks, benefits, and alternatives were explained to the patient. Questions regarding the procedure were encouraged and answered. The patient understands and consents to the procedure. A time-out was performed prior to initiating the procedure. The patient was placed under general anesthesia. Initial unenhanced CT was performed in a prone position to localize the left kidney. The patient was prepped with chlorhexidine in a sterile fashion, and a sterile drape was applied covering the operative field. A sterile gown and sterile gloves were used for the procedure. Contrast enhanced CT was performed of the kidneys with administration of IV contrast. A 17 gauge trocar needle was advanced into the left renal mass. Core biopsy was performed with an 18 gauge automated core biopsy device. A single core biopsy sample was submitted in formalin for pathologic analysis. After biopsy, a slurry of Gel-Foam pledgets mixed in sterile saline was injected through the 17 gauge needle as it was removed. Under CT guidance, a series of 3 separate [employer] CX percutaneous cryoablation probes were advanced into the left renal mass. Probe positioning was confirmed by CT prior to cryoablation. Cryoablation was performed through the 3  probes simultaneously. Initial 10 minute cycle of cryoablation was performed. This was followed by a 8 minute thaw cycle. A second 10 minute cycle of cryoablation was then performed. During ablation, periodic CT imaging was performed to monitor ice ball formation and morphology. After active thaw, a 30 second cautery cycle was performed and the cryoablation probes were then removed. COMPLICATIONS: Perinephric hemorrhage and hematuria. SIR level A: No therapy, no consequence. FINDINGS: Posterior mid to lower left renal mass was localized and after administration of contrast demonstrates avid enhancement. The mass measures approximately 3 x 3.5 cm in greatest transverse dimensions. Core biopsy yielded solid tissue. During biopsy and cryoablation probe placement, there was some perinephric bleeding present posterior to the kidney. Once ablation was begun, the amount of bleeding did not appear to increase significantly. During ablation, CT demonstrates adequate ice ball formation that appears to encompass the  margins of the tumor. During the second freeze, it did become evident that there was development of some hematuria in the Foley catheter drainage bag. This will be followed. IMPRESSION: CT guided core biopsy and cryoablation of 3.5 cm left renal mass. The procedure was complicated by posterior perinephric hemorrhage and hematuria. The patient will be observed overnight. Initial follow-up will be performed in approximately 4 weeks. Electronically Signed   By: Aletta Edouard M.D.   On: 03/28/2017 11:53     Assessment/Plan Principal Problem:   Traumatic perinephric hematoma of left kidney Active Problems:   Essential hypertension   Ischemic cardiomyopathy  EF 30% cath 4/14 with stent   Chronic combined systolic and diastolic congestive heart failure (HCC)   Paroxysmal atrial fibrillation (HCC)   CAD (coronary artery disease)   Overactive bladder   Left renal mass   Acute blood loss as cause of  postoperative anemia   Perinephric hematoma extending into the pelvis, likely contributing to her lower extremity weakness and sensation of pressure in the lower abdomen.  -Consider repeat CT scan of the abdomen and pelvis if her anemia continues to worsen or if she has worsening pain -  PT/OT evaluations tomorrow.  Acute blood loss anemia, acute postoperative anemia with mild hypotension -Every 6 hours H&H -Type and screen -Transfuse to keep hemoglobin greater than 8 g/dL  AKI and mild hematuria likely related to recent ablation -  Repeat BMP in AM -  Continue IVF  Left renal mass status post left renal cryoablation on 12/17 -She already has follow-up arranged with interventional radiology in 6 weeks  Paroxysmal atrial fibrillation, rate controlled generally -  Transfer to tele overnight -  Holding Eliquis -  Followed by Dr. Caryl Comes as an outpatient  Memory loss, at risk for delirium -  Continue pyridostigmine  CAD s/p CABG -Patient no longer on aspirin, statin, beta-blocker  Chronic combined systolic and diastolic congestive heart failure with an ejection fraction of 25-30% -Patient not normally on diuretics or beta blocker -Hold lisinopril -Judicious use of IV fluids - carefully trend uop  HTN, with relative hypotension -Hold lisinopril  Bladder spasms/overactive bladder -  Hold myrbetriq for now pending bladder scan -  Bladder scan and place foley if > 369mL retained urine  Depression, stable, continue lexapro   DVT prophylaxis: SCDs  Code Status: full code Family Communication:  Patient and her daughter and son.  Daughter is an NP for Pulm and Son is a Software engineer who works at [employer] Disposition Plan: unclear  Consults called: IR  Admission status: observation, telemetry   Janece Canterbury MD [facility] Pager [phone]  If 7PM-7AM, please contact night-coverage [email or web address] Password [email or web address]  03/29/2017, 6:31 PM

## 2017-03-30 LAB — CBC
HCT: 25.8 % — ABNORMAL LOW (ref 36.0–46.0)
HEMOGLOBIN: 8.5 g/dL — AB (ref 12.0–15.0)
MCH: 30.1 pg (ref 26.0–34.0)
MCHC: 32.9 g/dL (ref 30.0–36.0)
MCV: 91.5 fL (ref 78.0–100.0)
Platelets: 94 10*3/uL — ABNORMAL LOW (ref 150–400)
RBC: 2.82 MIL/uL — AB (ref 3.87–5.11)
RDW: 14.1 % (ref 11.5–15.5)
WBC: 8.7 10*3/uL (ref 4.0–10.5)

## 2017-03-30 LAB — BASIC METABOLIC PANEL
Anion gap: 5 (ref 5–15)
BUN: 21 mg/dL — AB (ref 6–20)
CHLORIDE: 107 mmol/L (ref 101–111)
CO2: 25 mmol/L (ref 22–32)
Calcium: 7.7 mg/dL — ABNORMAL LOW (ref 8.9–10.3)
Creatinine, Ser: 0.98 mg/dL (ref 0.44–1.00)
GFR calc Af Amer: >60 mL/min (ref >60)
GFR calc non Af Amer: 52 mL/min — ABNORMAL LOW (ref >60)
GLUCOSE: 111 mg/dL — AB (ref 65–99)
POTASSIUM: 4 mmol/L (ref 3.5–5.1)
Sodium: 137 mmol/L (ref 135–145)

## 2017-03-30 LAB — APTT: aPTT: 26 seconds (ref 24–36)

## 2017-03-30 LAB — HEMOGLOBIN AND HEMATOCRIT, BLOOD
HCT: 25.6 % — ABNORMAL LOW (ref 36.0–46.0)
Hemoglobin: 8.3 g/dL — ABNORMAL LOW (ref 12.0–15.0)

## 2017-03-30 LAB — PROTIME-INR
INR: 1.1
Prothrombin Time: 14.1 seconds (ref 11.4–15.2)

## 2017-03-30 NOTE — Progress Notes (Signed)
Referring Physician(s): Hunter,S  Supervising Physician: Jacqulynn Cadet  Patient Status:  [facility] - In-pt  Chief Complaint: Left renal mass, abdominal pain, perinephric hematoma post cryoablation/biopsy 12/17     Subjective: Patient still with gen weakness, intermittent abdominal/back/bilateral thigh discomfort.  Has eaten and voided without significant difficulty.  Urine yellow in color.  Denies nausea /vomiting. Some confusion persists. Has some arm edema.   Allergies: Aricept [donepezil] and Nitroglycerin  Medications: Prior to Admission medications   Medication Sig Start Date End Date Taking? Authorizing Provider  ELIQUIS 5 MG TABS tablet TAKE 1 TABLET BY MOUTH TWICE DAILY 05/13/16  Yes Deboraha Sprang, MD  escitalopram (LEXAPRO) 20 MG tablet TAKE 1 TABLET(20 MG) BY MOUTH DAILY Patient taking differently: TAKE 1 TABLET(20 MG) BY MOUTH at bedtime 03/07/17  Yes Marin Olp, MD  lisinopril (PRINIVIL,ZESTRIL) 2.5 MG tablet TAKE 1 TABLET(2.5 MG) BY MOUTH DAILY Patient taking differently: TAKE 1 TABLET(2.5 MG) BY MOUTH at bedtime 05/13/16  Yes Deboraha Sprang, MD  mirabegron ER (MYRBETRIQ) 25 MG TB24 tablet Take 1 tablet (25 mg total) by mouth daily. 11/04/16  Yes Marin Olp, MD  pyridostigmine (MESTINON) 60 MG tablet TAKE 1 TABLET(60 MG) BY MOUTH TWICE DAILY 10/27/16  Yes Marin Olp, MD     Vital Signs: BP 135/64 (BP Location: Right Arm)   Pulse 79   Temp 99.7 F (37.6 C) (Oral)   Resp 18   Ht 5\' 8"  (1.727 m)   Wt 130 lb (59 kg)   SpO2 91%   BMI 19.77 kg/m   Physical Exam  Awake/pleasantly confused; chest- sl dim BS bases; heart- paced, nl rate; abd- soft,+BS,ND, mild pelvic tenderness to palpation, puncture site left flank stable; ext- no edema, strength ok, sens ok  Imaging: Ct Abdomen Pelvis Wo Contrast  Result Date: 03/29/2017 CLINICAL DATA:  Status post left renal cryo ablation for mass yesterday. Drop in hemoglobin. EXAM: CT ABDOMEN AND  PELVIS WITHOUT CONTRAST TECHNIQUE: Multidetector CT imaging of the abdomen and pelvis was performed following the standard protocol without IV contrast. COMPARISON:  Abdominal CT 01/11/2017 and abdominopelvic CT 12/30/2016. Images from biopsy 03/28/2017. FINDINGS: Lower chest: New small left-greater-than-right pleural effusions with mild dependent left lower lobe atelectasis. The heart is enlarged status post median sternotomy and pacemaker placement. Hepatobiliary: No ectatic abnormalities are identified on noncontrast imaging. Vicarious excretion of contrast is demonstrated within the gallbladder lumen. No evidence of gallbladder wall thickening or biliary dilatation. Pancreas: Atrophied without evidence of acute hemorrhage, ductal dilatation or surrounding inflammation. Spleen: Normal in size without focal abnormality. Adrenals/Urinary Tract: Both adrenal glands appear normal. The right kidney appears unremarkable. Subcapsular and perinephric hematoma on the left has progressed from yesterday. There is a subcapsular component measuring up to 1.8 cm in thickness. Perinephric components extend into the left posterior pararenal space and inferiorly into the pelvis in the posterior perirectal space. There is a persistent nephrogram on the left which is disrupted by the underlying known mass in the lower pole. No active bleeding evident. There is no hydronephrosis. Contrast material is present in the urinary bladder. Stomach/Bowel: No evidence of bowel wall thickening, distention or surrounding inflammatory change. Vascular/Lymphatic: There are no enlarged abdominal or pelvic lymph nodes. Aortic and branch vessel atherosclerosis. No acute vascular findings on noncontrast imaging. Reproductive: Hysterectomy. No evidence of adnexal mass. There is a small amount of free pelvic fluid. No generalized ascites or free air. Other: As above, inferior extension of perinephric hematoma into the perirectal  fat. Small amount of  free pelvic fluid. Musculoskeletal: No acute or significant osseous findings. IMPRESSION: 1. Progression of the known left renal subcapsular and perinephric hematoma since cryoablation procedure yesterday. Hemorrhage extends into the perirectal fat, and there is a small amount of free pelvic fluid. 2. Associated persistent nephrogram on the left.  No hydronephrosis. 3. Small bilateral pleural effusions with left lower lobe atelectasis. 4.  Aortic Atherosclerosis (ICD10-I70.0). 5. These results were called by telephone at the time of interpretation on 03/29/2017 at 5:00 pm to [facility] , who verbally acknowledged these results. Electronically Signed   By: Richardean Sale M.D.   On: 03/29/2017 17:13   Ct Guide Tissue Ablation  Result Date: 03/28/2017 CLINICAL DATA:  3.4 cm mass of the left kidney with imaging characteristics consistent with renal carcinoma. The patient presents for biopsy and ablation under CT guidance. EXAM: CT-GUIDED CORE BIOPSY OF LEFT RENAL MASS CT-GUIDED PERCUTANEOUS CRYOABLATION OF LEFT RENAL MASS ANESTHESIA/SEDATION: General MEDICATIONS: 2 g IV Ancef. The antibiotic was administered in an appropriate time interval prior to needle puncture of the skin. CONTRAST:  42mL ISOVUE-370 IOPAMIDOL (ISOVUE-370) INJECTION 76% PROCEDURE: The procedure, risks, benefits, and alternatives were explained to the patient. Questions regarding the procedure were encouraged and answered. The patient understands and consents to the procedure. A time-out was performed prior to initiating the procedure. The patient was placed under general anesthesia. Initial unenhanced CT was performed in a prone position to localize the left kidney. The patient was prepped with chlorhexidine in a sterile fashion, and a sterile drape was applied covering the operative field. A sterile gown and sterile gloves were used for the procedure. Contrast enhanced CT was performed of the kidneys with administration of IV contrast. A  17 gauge trocar needle was advanced into the left renal mass. Core biopsy was performed with an 18 gauge automated core biopsy device. A single core biopsy sample was submitted in formalin for pathologic analysis. After biopsy, a slurry of Gel-Foam pledgets mixed in sterile saline was injected through the 17 gauge needle as it was removed. Under CT guidance, a series of 3 separate [employer] CX percutaneous cryoablation probes were advanced into the left renal mass. Probe positioning was confirmed by CT prior to cryoablation. Cryoablation was performed through the 3 probes simultaneously. Initial 10 minute cycle of cryoablation was performed. This was followed by a 8 minute thaw cycle. A second 10 minute cycle of cryoablation was then performed. During ablation, periodic CT imaging was performed to monitor ice ball formation and morphology. After active thaw, a 30 second cautery cycle was performed and the cryoablation probes were then removed. COMPLICATIONS: Perinephric hemorrhage and hematuria. SIR level A: No therapy, no consequence. FINDINGS: Posterior mid to lower left renal mass was localized and after administration of contrast demonstrates avid enhancement. The mass measures approximately 3 x 3.5 cm in greatest transverse dimensions. Core biopsy yielded solid tissue. During biopsy and cryoablation probe placement, there was some perinephric bleeding present posterior to the kidney. Once ablation was begun, the amount of bleeding did not appear to increase significantly. During ablation, CT demonstrates adequate ice ball formation that appears to encompass the margins of the tumor. During the second freeze, it did become evident that there was development of some hematuria in the Foley catheter drainage bag. This will be followed. IMPRESSION: CT guided core biopsy and cryoablation of 3.5 cm left renal mass. The procedure was complicated by posterior perinephric hemorrhage and hematuria. The patient will  be  observed overnight. Initial follow-up will be performed in approximately 4 weeks. Electronically Signed   By: Aletta Edouard M.D.   On: 03/28/2017 11:53    Labs:  CBC: Recent Labs    03/22/17 1217 03/29/17 0337 03/29/17 1420 03/29/17 1937 03/30/17 0150 03/30/17 0846  WBC 7.7 12.7* 9.9  --  8.7  --   HGB 13.2 9.7* 8.6* 8.7* 8.5* 8.3*  HCT 40.7 30.1* 25.7* 26.6* 25.8* 25.6*  PLT 135* 123* 91*  --  94*  --     COAGS: Recent Labs    03/22/17 1217 03/30/17 0150  INR 1.10 1.10  APTT  --  26    BMP: Recent Labs    04/03/16 1556  12/29/16 1551 03/22/17 1217 03/29/17 0337 03/30/17 0150  NA 137   < > 140 140 138 137  K 3.7   < > 4.1 4.0 4.1 4.0  CL 105   < > 103 103 105 107  CO2 25   < > 32 29 27 25   GLUCOSE 87   < > 107* 85 135* 111*  BUN 11   < > 20 13 20  21*  CALCIUM 8.6*   < > 8.7 9.0 8.0* 7.7*  CREATININE 0.78   < > 0.86 0.72 1.28* 0.98  GFRNONAA >60  --   --  >60 38* 52*  GFRAA >60  --   --  >60 44* >60   < > = values in this interval not displayed.    LIVER FUNCTION TESTS: No results for input(s): BILITOT, AST, ALT, ALKPHOS, PROT, ALBUMIN in the last 8760 hours.  Assessment and Plan: Pt with hx left renal mass, s/p bx/cryoablation 03/28/17 with subsequent perinephric hematoma; temp 99.7, UO 900 cc; WBC nl; hgb 8.3 (8.5-8.7-8.6), plts 94k, K 4.0, creat 0.98, path from renal bx pend; cont IV hydration, serial hgb checks- consider transfusion if below 8; appreciate TRH assistance; groin/thigh discomfort most likely related to blood tracking along RP/psoas regions; pt will f/u with Dr. Kathlene Cote in [place] [facility] in 1 month; hold anticoagulants; cont SCD's; bedrest with BRP for now; Dr. Kathlene Cote updated and no additional recs for now     Electronically Signed: D. Rowe Robert, PA-C 03/30/2017, 11:31 AM   I spent a total of 20 minutes at the the patient's bedside AND on the patient's hospital floor or unit, greater than 50% of which was counseling/coordinating  care for  left renal mass biopsy/cryoablation      Patient ID: [name] [name], female   DOB: [birth year], 83 y.o.   MRN: [id number]

## 2017-03-30 NOTE — Evaluation (Signed)
Occupational Therapy Evaluation Patient Details Name: [name] [name] MRN: [id number] DOB: 08/09/1931 Today's Date: 03/30/2017    History of Present Illness [name] [name] is a 83 y.o. female with history of atrial fibrillation, coronary artery disease s/p CABG, sCHF with EF of 25-30%, hypertension, memory loss, history of uterine cancer, and more recently diagnosed left renal mass.  The patient was incidentally found to have a 3.2 cm enhancing mass of the left lower kidney in September of this year.  She did not have any metastatic disease on imaging and was referred for an ablation by radiology.  She presented on 12/17 and underwent left renal cryoablation.  Postprocedure she developed a perinephric hematoma.  Patient is s/p left renal cryoablation    Clinical Impression   Pt s/p L renal cryoblation. Pt currently with functional limitations due to the deficits listed below (see OT Problem List).  Pt will benefit from skilled OT to increase their safety and independence with ADL and functional mobility for ADL to facilitate discharge to venue listed below.      Follow Up Recommendations  Home health OT;Supervision/Assistance - 24 hour(depending on progress and familly ablility to A)    Equipment Recommendations  3 in 1 bedside commode    Recommendations for Other Services       Precautions / Restrictions Precautions Precautions: Fall      Mobility Bed Mobility Overal bed mobility: Needs Assistance Bed Mobility: Supine to Sit     Supine to sit: Mod assist        Transfers Overall transfer level: Needs assistance Equipment used: Rolling walker (2 wheeled) Transfers: Sit to/from [facility] Sit to Stand: Min assist Stand pivot transfers: Min assist       General transfer comment: min A with VC for hand placement and use of walker    Balance                                           ADL either performed or assessed with clinical  judgement   ADL Overall ADL's : Needs assistance/impaired Eating/Feeding: Set up   Grooming: Set up;Sitting   Upper Body Bathing: Minimal assistance;Sitting   Lower Body Bathing: Moderate assistance;Sit to/from stand;Cueing for sequencing;Cueing for safety   Upper Body Dressing : Minimal assistance;Sitting   Lower Body Dressing: Moderate assistance;Sit to/from stand;Cueing for sequencing;Cueing for safety   Toilet Transfer: Minimal assistance;BSC;RW             General ADL Comments: mild confusion noted     Vision Patient Visual Report: No change from baseline       Perception     Praxis      Pertinent Vitals/Pain Pain Assessment: 0-10 Pain Score: 4  Pain Location: bilateral thighs Pain Descriptors / Indicators: Aching;Dull;Sore Pain Intervention(s): Limited activity within patient's tolerance;Monitored during session;Repositioned;Other (comment)(Rn notified)     Hand Dominance     Extremity/Trunk Assessment Upper Extremity Assessment Upper Extremity Assessment: Generalized weakness           Communication Communication Communication: No difficulties   Cognition Arousal/Alertness: Awake/alert Behavior During Therapy: WFL for tasks assessed/performed Overall Cognitive Status: Within Functional Limits for tasks assessed                                 General Comments: mild confusion noted but seemd  to clear   General Comments       Exercises     Shoulder Instructions      Home Living Family/patient expects to be discharged to:: Private residence Living Arrangements: Alone Available Help at Discharge: Family Type of Home: House Home Access: Stairs to enter           [employer] Shower/Tub: Walk-in Psychologist, prison and probation services: Standard     Home Equipment: Environmental consultant - 2 wheels          Prior Functioning/Environment Level of Independence: Independent        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         OT Problem List: Decreased strength;Decreased activity tolerance;Impaired balance (sitting and/or standing);Decreased safety awareness;Decreased knowledge of precautions;Decreased knowledge of use of DME or AE;Pain      OT Treatment/Interventions: Self-care/ADL training;Patient/family education;DME and/or AE instruction    OT Goals(Current goals can be found in the care plan section) Acute Rehab OT Goals Patient Stated Goal: get well OT Goal Formulation: With patient Time For Goal Achievement: 04/06/17 Potential to Achieve Goals: Good  OT Frequency: Min 2X/week    AM-PAC PT "6 Clicks" Daily Activity     Outcome Measure Help from another person eating meals?: None Help from another person taking care of personal grooming?: A Little Help from another person toileting, which includes using toliet, bedpan, or urinal?: A Lot Help from another person bathing (including washing, rinsing, drying)?: A Little Help from another person to put on and taking off regular upper body clothing?: A Little Help from another person to put on and taking off regular lower body clothing?: A Lot 6 Click Score: 17   End of Session Equipment Utilized During Treatment: Rolling walker Nurse Communication: Mobility status  Activity Tolerance: Patient tolerated treatment well Patient left: in chair;with call bell/phone within reach;with family/visitor present  OT Visit Diagnosis: Unsteadiness on feet (R26.81);Muscle weakness (generalized) (M62.81);Pain Pain - part of body: Leg                Time: 5038-8828 OT Time Calculation (min): 26 min Charges:  OT General Charges $OT Visit: 1 Visit OT Evaluation $OT Eval Moderate Complexity: 1 Mod OT Treatments $Self Care/Home Management : 8-22 mins G-Codes:     Kari Baars, OT [phone]  Payton Mccallum D 03/30/2017, 2:19 PM

## 2017-03-30 NOTE — Evaluation (Signed)
Physical Therapy Evaluation Patient Details Name: [name] [name] MRN: [id number] DOB: 08/03/1931 Today's Date: 03/30/2017   History of Present Illness  [name] [name] is a 83 y.o. female with history of atrial fibrillation, coronary artery disease s/p CABG, sCHF with EF of 25-30%, hypertension, memory loss, history of uterine cancer, and more recently diagnosed left renal mass.  The patient was incidentally found to have a 3.2 cm enhancing mass of the left lower kidney in September of this year.  She did not have any metastatic disease on imaging and was referred for an ablation by radiology.  She presented on 12/17 and underwent left renal cryoablation.  Postprocedure she developed a perinephric hematoma.  Patient is s/p left renal cryoablation   Clinical Impression  The patient presents with mild confusion and requires direction for activity. Noted  To be shakey and requires assistance to ambualte for safety and balance. Pt admitted with above diagnosis. Pt currently with functional limitations due to the deficits listed below (see PT Problem List).  Pt will benefit from skilled PT to increase their independence and safety with mobility to allow discharge to the venue listed below.   Son in law states  daughte r from out of town is visiting and in home care may be increased for hours.      Follow Up Recommendations Home health PT;Supervision/Assistance - 24 hour    Equipment Recommendations  None recommended by PT    Recommendations for Other Services       Precautions / Restrictions Precautions Precautions: Fall      Mobility  Bed Mobility Overal bed mobility: Needs Assistance Bed Mobility: Supine to Sit     Supine to sit: Mod assist     General bed mobility comments: in recliner  Transfers Overall transfer level: Needs assistance Equipment used: Rolling walker (2 wheeled) Transfers: Sit to/from Stand Sit to Stand: Min assist Stand pivot transfers: Min assist        General transfer comment: min A with VC for hand placement and use of walker  Ambulation/Gait Ambulation/Gait assistance: Min assist Ambulation Distance (Feet): 125 Feet Assistive device: Rolling walker (2 wheeled) Gait Pattern/deviations: Step-through pattern     General Gait Details: steady assist for tuns. ambulated x 15' in room without RW, relies on support of objects. noted tremulous in  arms and feet.  Stairs            Wheelchair Mobility    Modified Rankin (Stroke Patients Only)       Balance Overall balance assessment: Needs assistance Sitting-balance support: Feet supported;No upper extremity supported Sitting balance-Leahy Scale: Fair     Standing balance support: During functional activity;No upper extremity supported Standing balance-Leahy Scale: Poor Standing balance comment: relies on UE's                             Pertinent Vitals/Pain Pain Assessment: 0-10 Pain Score: 0-No pain Pain Location: bilateral thighs Pain Descriptors / Indicators: Aching;Dull;Sore Pain Intervention(s): Limited activity within patient's tolerance;Monitored during session;Repositioned;Other (comment)(Rn notified)    Home Living Family/patient expects to be discharged to:: Private residence Living Arrangements: Alone Available Help at Discharge: Personal care attendant Type of Home: House Home Access: Stairs to enter   Entrance Stairs-Number of Steps: 1   Home Equipment: Walker - 2 wheels Additional Comments: PCA  3 hours per day- possible can increase the hours, engage [place] therapies per family memeber    Prior Function Level of Independence:  Independent         Comments: retired Teacher, adult education        Extremity/Trunk Assessment   Upper Extremity Assessment Upper Extremity Assessment: Defer to OT evaluation    Lower Extremity Assessment Lower Extremity Assessment: Generalized weakness     Cervical / Trunk Assessment Cervical / Trunk Assessment: Normal  Communication   Communication: No difficulties  Cognition Arousal/Alertness: Awake/alert Behavior During Therapy: WFL for tasks assessed/performed Overall Cognitive Status: Impaired/Different from baseline Area of Impairment: Orientation;Safety/judgement;Problem solving                 Orientation Level: Place             General Comments: thought  that she recently moved away from [place],-son inlaw present      General Comments      Exercises     Assessment/Plan    PT Assessment Patient needs continued PT services  PT Problem List Decreased strength;Decreased cognition;Decreased knowledge of use of DME;Decreased activity tolerance;Decreased safety awareness;Decreased balance;Decreased knowledge of precautions;Decreased mobility       PT Treatment Interventions DME instruction;Gait training;Functional mobility training;Therapeutic activities;Therapeutic exercise;Patient/family education    PT Goals (Current goals can be found in the Care Plan section)  Acute Rehab PT Goals Patient Stated Goal: get well PT Goal Formulation: With patient/family Time For Goal Achievement: 04/13/17 Potential to Achieve Goals: Fair    Frequency Min 2X/week   Barriers to discharge        Co-evaluation               AM-PAC PT "6 Clicks" Daily Activity  Outcome Measure Difficulty turning over in bed (including adjusting bedclothes, sheets and blankets)?: A Lot Difficulty moving from lying on back to sitting on the side of the bed? : A Lot Difficulty sitting down on and standing up from a chair with arms (e.g., wheelchair, bedside commode, etc,.)?: A Lot Help needed moving to and from a bed to chair (including a wheelchair)?: A Lot Help needed walking in hospital room?: A Lot Help needed climbing 3-5 steps with a railing? : A Lot 6 Click Score: 12    End of Session   Activity Tolerance: Patient  tolerated treatment well Patient left: in chair;with call bell/phone within reach;with family/visitor present Nurse Communication: Mobility status PT Visit Diagnosis: Unsteadiness on feet (R26.81)    Time: 1410-1430 PT Time Calculation (min) (ACUTE ONLY): 20 min   Charges:   PT Evaluation $PT Eval Low Complexity: 1 Low     PT G CodesClaretha [name] 03/30/2017, 2:41 PM  Tresa Endo PT [phone]

## 2017-03-30 NOTE — Progress Notes (Signed)
PROGRESS NOTE    [name] [name]  ESP:[id number] DOB: [birth year] DOA: 03/28/2017 PCP: Marin Olp, MD  Brief Narrative:  83 y.o. female with history of atrial fibrillation, coronary artery diseases/p CABG,sCHF with EFof 25-30%, hypertension, memory loss,history of uterine cancer, and more recently diagnosed left renal mass. The patient was incidentally found to have a 3.2 cm enhancing mass of the left lower kidney in September of this year. She did not have any metastatic disease on imaging and was referred for an ablation by radiology. She presented on 12/17 and underwent left renal cryoablation. Postprocedure she developed a perinephric hematoma. Her baseline hemoglobin is 12-13 g/dL. Postprocedure it dropped to 9.7 g/dL and is now down to 8.6 g/dL. Repeat CT scan on 12/18 showed that the hematoma has increased compared to yesterday.  She has developed some dizziness, fatigue, lower extremity weakness and lower abdominal pressure since her procedure.  We were consulted by IR to assist with her postoperative complication given her multiple comorbidities.  Patient's family states that she stopped her Eliquis 2 days prior to surgery and she did not take any aspirin or ibuprofen in the subsequent days.   12/19-patient complains of back pain on th elft.confusion better.daughter by the bed side.  Assessment & Plan:   Principal Problem:   Traumatic perinephric hematoma of left kidney Active Problems:   Essential hypertension   Ischemic cardiomyopathy  EF 30% cath 4/14 with stent   Chronic combined systolic and diastolic congestive heart failure (HCC)   Paroxysmal atrial fibrillation (HCC)   CAD (coronary artery disease)   Overactive bladder   Left renal mass   Acute blood loss as cause of postoperative anemia Perinephric hematoma extending into the pelvis, likely contributing to her lower extremity weakness and sensation of pressure in the lower abdomen.  -Consider repeat CT scan  of the abdomen and pelvis if her anemia continues to worsen or if she has worsening pain -  PT/OT evaluations tomorrow.  Acute blood loss anemia, acute postoperative anemia with mild hypotension -Every 6 hours H&H -Type and screen -Transfuse to keep hemoglobin greater than 8 g/dL  AKI and mild hematuria likely related to recent ablation -  Repeat BMP in AM -  Continue IVF  Left renal mass status post left renal cryoablation on 12/17 -Shealreadyhas follow-up arranged with interventional radiology in 6 weeks  Paroxysmal atrial fibrillation, rate controlled generally -  Transfer to tele overnight -  HoldingEliquis -  Followed by Dr. Caryl Comes as an outpatient  Memory loss, at risk for delirium - Continue pyridostigmine  CAD s/p CABG -Patient no longer on aspirin,statin,beta-blocker  Chronic combined systolic and diastolic congestive heart failure with an ejection fraction of 25-30% -Patient not normally on diuretics or beta blocker -Hold lisinopril -Judicioususe of IV fluids - carefully trend uop  HTN,with relative hypotension -Hold lisinopril  Bladder spasms/overactive bladder - Hold myrbetriq for now pending bladder scan -  Bladder scan and place foley if > 372mL retained urine  Depression, stable, continue lexapro       DVT prophylaxis:scd Code Status:full Family Communication:  dw daughter Disposition Plan:if stable dc tomorrow. Consultants:  ir Procedures:none Antimicrobials:  none Subjective: C/o pain   Objective:resting in bed.in nad Vitals:   03/30/17 0037 03/30/17 0214 03/30/17 0458 03/30/17 1501  BP: 137/66  135/64 (!) 145/53  Pulse: 77  79 69  Resp: 18  18 19   Temp: 100.3 F (37.9 C) 99.4 F (37.4 C) 99.7 F (37.6 C) (!) 100.8 F (38.2 C)  TempSrc: Oral Oral Oral Oral  SpO2: 95%  91% 97%  Weight:      Height:        Intake/Output Summary (Last 24 hours) at 03/30/2017 1539 Last data filed at 03/30/2017 1500 Gross per  24 hour  Intake 2180.83 ml  Output 1500 ml  Net 680.83 ml   Filed Weights   03/28/17 0640 03/28/17 0740  Weight: 59 kg (130 lb) 59 kg (130 lb)    Examination:  General exam: Appears calm and comfortable  Respiratory system: Clear to auscultation. Respiratory effort normal. Cardiovascular system: S1 & S2 heard, RRR. No JVD, murmurs, rubs, gallops or clicks. No pedal edema. Gastrointestinal system: Abdomen is nondistended, soft and tender. No organomegaly or masses felt. Normal bowel sounds heard. Central nervous system: Alert and oriented. No focal neurological deficits. Extremities: Symmetric 5 x 5 power. Skin: No rashes, lesions or ulcers Psychiatry: Judgement and insight appear normal. Mood & affect appropriate.     Data Reviewed: I have personally reviewed following labs and imaging studies  CBC: Recent Labs  Lab 03/29/17 0337 03/29/17 1420 03/29/17 1937 03/30/17 0150 03/30/17 0846  WBC 12.7* 9.9  --  8.7  --   NEUTROABS 10.1* 7.8*  --   --   --   HGB 9.7* 8.6* 8.7* 8.5* 8.3*  HCT 30.1* 25.7* 26.6* 25.8* 25.6*  MCV 90.1 90.5  --  91.5  --   PLT 123* 91*  --  94*  --    Basic Metabolic Panel: Recent Labs  Lab 03/29/17 0337 03/30/17 0150  NA 138 137  K 4.1 4.0  CL 105 107  CO2 27 25  GLUCOSE 135* 111*  BUN 20 21*  CREATININE 1.28* 0.98  CALCIUM 8.0* 7.7*   GFR: Estimated Creatinine Clearance: 40.5 mL/min (by C-G formula based on SCr of 0.98 mg/dL). Liver Function Tests: No results for input(s): AST, ALT, ALKPHOS, BILITOT, PROT, ALBUMIN in the last 168 hours. No results for input(s): LIPASE, AMYLASE in the last 168 hours. No results for input(s): AMMONIA in the last 168 hours. Coagulation Profile: Recent Labs  Lab 03/30/17 0150  INR 1.10   Cardiac Enzymes: No results for input(s): CKTOTAL, CKMB, CKMBINDEX, TROPONINI in the last 168 hours. BNP (last 3 results) No results for input(s): PROBNP in the last 8760 hours. HbA1C: No results for input(s):  HGBA1C in the last 72 hours. CBG: No results for input(s): GLUCAP in the last 168 hours. Lipid Profile: No results for input(s): CHOL, HDL, LDLCALC, TRIG, CHOLHDL, LDLDIRECT in the last 72 hours. Thyroid Function Tests: No results for input(s): TSH, T4TOTAL, FREET4, T3FREE, THYROIDAB in the last 72 hours. Anemia Panel: No results for input(s): VITAMINB12, FOLATE, FERRITIN, TIBC, IRON, RETICCTPCT in the last 72 hours. Sepsis Labs: No results for input(s): PROCALCITON, LATICACIDVEN in the last 168 hours.  No results found for this or any previous visit (from the past 240 hour(s)).       Radiology Studies: Ct Abdomen Pelvis Wo Contrast  Result Date: 03/29/2017 CLINICAL DATA:  Status post left renal cryo ablation for mass yesterday. Drop in hemoglobin. EXAM: CT ABDOMEN AND PELVIS WITHOUT CONTRAST TECHNIQUE: Multidetector CT imaging of the abdomen and pelvis was performed following the standard protocol without IV contrast. COMPARISON:  Abdominal CT 01/11/2017 and abdominopelvic CT 12/30/2016. Images from biopsy 03/28/2017. FINDINGS: Lower chest: New small left-greater-than-right pleural effusions with mild dependent left lower lobe atelectasis. The heart is enlarged status post median sternotomy and pacemaker placement. Hepatobiliary: No ectatic abnormalities are  identified on noncontrast imaging. Vicarious excretion of contrast is demonstrated within the gallbladder lumen. No evidence of gallbladder wall thickening or biliary dilatation. Pancreas: Atrophied without evidence of acute hemorrhage, ductal dilatation or surrounding inflammation. Spleen: Normal in size without focal abnormality. Adrenals/Urinary Tract: Both adrenal glands appear normal. The right kidney appears unremarkable. Subcapsular and perinephric hematoma on the left has progressed from yesterday. There is a subcapsular component measuring up to 1.8 cm in thickness. Perinephric components extend into the left posterior pararenal  space and inferiorly into the pelvis in the posterior perirectal space. There is a persistent nephrogram on the left which is disrupted by the underlying known mass in the lower pole. No active bleeding evident. There is no hydronephrosis. Contrast material is present in the urinary bladder. Stomach/Bowel: No evidence of bowel wall thickening, distention or surrounding inflammatory change. Vascular/Lymphatic: There are no enlarged abdominal or pelvic lymph nodes. Aortic and branch vessel atherosclerosis. No acute vascular findings on noncontrast imaging. Reproductive: Hysterectomy. No evidence of adnexal mass. There is a small amount of free pelvic fluid. No generalized ascites or free air. Other: As above, inferior extension of perinephric hematoma into the perirectal fat. Small amount of free pelvic fluid. Musculoskeletal: No acute or significant osseous findings. IMPRESSION: 1. Progression of the known left renal subcapsular and perinephric hematoma since cryoablation procedure yesterday. Hemorrhage extends into the perirectal fat, and there is a small amount of free pelvic fluid. 2. Associated persistent nephrogram on the left.  No hydronephrosis. 3. Small bilateral pleural effusions with left lower lobe atelectasis. 4.  Aortic Atherosclerosis (ICD10-I70.0). 5. These results were called by telephone at the time of interpretation on 03/29/2017 at 5:00 pm to [facility] , who verbally acknowledged these results. Electronically Signed   By: Richardean Sale M.D.   On: 03/29/2017 17:13        Scheduled Meds: . docusate sodium  100 mg Oral BID  . escitalopram  20 mg Oral QHS  . pyridostigmine  60 mg Oral BID   Continuous Infusions: . sodium chloride Stopped (03/30/17 1401)     LOS: 1 day     Georgette Shell, MD [facility] If 7PM-7AM, please contact night-coverage [email or web address] Password [facility] 03/30/2017, 3:39 PM

## 2017-03-31 ENCOUNTER — Telehealth: Payer: Self-pay | Admitting: Family Medicine

## 2017-03-31 ENCOUNTER — Telehealth: Payer: Self-pay | Admitting: Internal Medicine

## 2017-03-31 LAB — BASIC METABOLIC PANEL
ANION GAP: 4 — AB (ref 5–15)
BUN: 12 mg/dL (ref 6–20)
CALCIUM: 8 mg/dL — AB (ref 8.9–10.3)
CO2: 27 mmol/L (ref 22–32)
Chloride: 108 mmol/L (ref 101–111)
Creatinine, Ser: 0.71 mg/dL (ref 0.44–1.00)
GFR calc Af Amer: >60 mL/min (ref >60)
GLUCOSE: 106 mg/dL — AB (ref 65–99)
Potassium: 3.7 mmol/L (ref 3.5–5.1)
SODIUM: 139 mmol/L (ref 135–145)

## 2017-03-31 LAB — CBC
HCT: 25.5 % — ABNORMAL LOW (ref 36.0–46.0)
Hemoglobin: 8.4 g/dL — ABNORMAL LOW (ref 12.0–15.0)
MCH: 29.9 pg (ref 26.0–34.0)
MCHC: 32.9 g/dL (ref 30.0–36.0)
MCV: 90.7 fL (ref 78.0–100.0)
PLATELETS: 78 10*3/uL — AB (ref 150–400)
RBC: 2.81 MIL/uL — ABNORMAL LOW (ref 3.87–5.11)
RDW: 14 % (ref 11.5–15.5)
WBC: 8 10*3/uL (ref 4.0–10.5)

## 2017-03-31 MED ORDER — POLYETHYLENE GLYCOL 3350 17 G PO PACK
17.0000 g | PACK | Freq: Every day | ORAL | Status: DC
  Administered 2017-03-31: 17 g via ORAL
  Filled 2017-03-31: qty 1

## 2017-03-31 MED ORDER — BISACODYL 10 MG RE SUPP
10.0000 mg | Freq: Once | RECTAL | Status: AC
  Administered 2017-03-31: 10 mg via RECTAL
  Filled 2017-03-31: qty 1

## 2017-03-31 MED ORDER — HYDROCODONE-ACETAMINOPHEN 5-325 MG PO TABS: 1.0000 | ORAL_TABLET | Freq: Four times a day (QID) | ORAL | 0 refills | Status: DC | PRN

## 2017-03-31 MED ORDER — POLYETHYLENE GLYCOL 3350 17 G PO PACK: 17.0000 g | PACK | Freq: Every day | ORAL | 0 refills | Status: DC

## 2017-03-31 MED ORDER — ONDANSETRON 4 MG PO TBDP: 4.0000 mg | ORAL_TABLET | Freq: Three times a day (TID) | ORAL | Status: DC | PRN

## 2017-03-31 MED ORDER — SENNOSIDES-DOCUSATE SODIUM 8.6-50 MG PO TABS: 1.0000 | ORAL_TABLET | Freq: Every day | ORAL | Status: DC | PRN

## 2017-03-31 MED ORDER — ONDANSETRON 4 MG PO TBDP: 4.0000 mg | ORAL_TABLET | Freq: Three times a day (TID) | ORAL | 0 refills | Status: DC | PRN

## 2017-03-31 NOTE — Progress Notes (Signed)
Occupational Therapy Treatment Patient Details Name: [name] [name] MRN: [id number] DOB: [birth year] Today's Date: 03/31/2017    History of present illness [name] [name] is a 83 y.o. female with history of atrial fibrillation, coronary artery disease s/p CABG, sCHF with EF of 25-30%, hypertension, memory loss, history of uterine cancer, and more recently diagnosed left renal mass.  The patient was incidentally found to have a 3.2 cm enhancing mass of the left lower kidney in September of this year.  She did not have any metastatic disease on imaging and was referred for an ablation by radiology.  She presented on 12/17 and underwent left renal cryoablation.  Postprocedure she developed a perinephric hematoma.  Patient is s/p left renal cryoablation    OT comments  Pt progressing towards acute OT goals. Focus of session was UB/LB dressing and toilet transfer. Also discussed energy conservation strategies for ADLs. Son-in-law present and included in education.   Follow Up Recommendations  Home health OT;Supervision/Assistance - 24 hour    Equipment Recommendations  3 in 1 bedside commode    Recommendations for Other Services      Precautions / Restrictions Precautions Precautions: Fall Restrictions Weight Bearing Restrictions: No       Mobility Bed Mobility Overal bed mobility: Needs Assistance Bed Mobility: Supine to Sit     Supine to sit: Mod assist     General bed mobility comments: assist to powerup trunk  Transfers Overall transfer level: Needs assistance Equipment used: Rolling walker (2 wheeled) Transfers: Sit to/from Stand Sit to Stand: Min guard;Min assist         General transfer comment: close min guard on initial stand. from EOB, toilet, and to recliner    Balance Overall balance assessment: Needs assistance Sitting-balance support: Feet supported;No upper extremity supported Sitting balance-Leahy Scale: Good Sitting balance - Comments: UB dressing sitting  EOB   Standing balance support: During functional activity;No upper extremity supported Standing balance-Leahy Scale: Poor Standing balance comment: relies on UE's                           ADL either performed or assessed with clinical judgement   ADL Overall ADL's : Needs assistance/impaired                 Upper Body Dressing : Set up;Sitting   Lower Body Dressing: Minimal assistance;Sit to/from stand;Min guard   Toilet Transfer: Min guard;Ambulation;RW   Toileting- Water quality scientist and Hygiene: Min guard;Sit to/from stand       Functional mobility during ADLs: Min guard;Rolling walker General ADL Comments: Pt completed UB/LB dressing, toilet transfer, pericare, and ambulated in room. Appeared fatigued but very motivated to sit up in recliner at end of session. Energy conservation strategies discussed with pt.      Vision       Perception     Praxis      Cognition Arousal/Alertness: Awake/alert Behavior During Therapy: WFL for tasks assessed/performed Overall Cognitive Status: Within Functional Limits for tasks assessed                                 General Comments: some mild cognitive deficits noted. appears to be baseline.         Exercises     Shoulder Instructions       General Comments      Pertinent Vitals/ Pain       Pain  Assessment: No/denies pain  Home Living                                          Prior Functioning/Environment              Frequency  Min 2X/week        Progress Toward Goals  OT Goals(current goals can now be found in the care plan section)  Progress towards OT goals: Progressing toward goals  Acute Rehab OT Goals Patient Stated Goal: get well OT Goal Formulation: With patient Time For Goal Achievement: 04/06/17 Potential to Achieve Goals: Good ADL Goals Pt Will Perform Grooming: standing;with supervision Pt Will Perform Lower Body Bathing: with  supervision;sit to/from stand Pt Will Perform Lower Body Dressing: with supervision;sit to/from stand Pt Will Transfer to Toilet: with supervision;regular height toilet;ambulating Pt Will Perform Toileting - Clothing Manipulation and hygiene: with supervision;sit to/from stand Pt Will Perform Tub/Shower Transfer: Shower transfer;with supervision  Plan Discharge plan remains appropriate    Co-evaluation                 AM-PAC PT "6 Clicks" Daily Activity     Outcome Measure   Help from another person eating meals?: None Help from another person taking care of personal grooming?: A Little Help from another person toileting, which includes using toliet, bedpan, or urinal?: A Lot Help from another person bathing (including washing, rinsing, drying)?: A Little Help from another person to put on and taking off regular upper body clothing?: A Little Help from another person to put on and taking off regular lower body clothing?: A Lot 6 Click Score: 17    End of Session Equipment Utilized During Treatment: Rolling walker  OT Visit Diagnosis: Unsteadiness on feet (R26.81);Muscle weakness (generalized) (M62.81);Pain   Activity Tolerance Patient tolerated treatment well   Patient Left in chair;with call bell/phone within reach;with family/visitor present   Nurse Communication Mobility status        Time: 1033-1100 OT Time Calculation (min): 27 min  Charges: OT General Charges $OT Visit: 1 Visit OT Treatments $Self Care/Home Management : 23-37 mins     Hortencia Pilar 03/31/2017, 1:31 PM

## 2017-03-31 NOTE — Telephone Encounter (Signed)
I spoke with [name]. She reports pt took last dose of Eliquis the morning of 12/15.  Had kidney procedure on 12/17.  Developed hematoma around left kidney post procedure.  Hemoglobin and platelets also dropped. Eliquis has been placed on hold for one week by Dr. Kathlene Cote.  Cardiology guidance has been requested as to when pt should resume Eliquis. Family is contacting primary care (Dr. Yong Channel) to see if CBC and BMP can be checked on 12/24.  Pt is scheduled to see [name] on 04/11/17

## 2017-03-31 NOTE — Discharge Summary (Signed)
Physician Discharge Summary  [name] [name] VOZ:[id number] DOB: [birth year] DOA: 03/28/2017  PCP: Marin Olp, MD  Admit date: 03/28/2017 Discharge date: 03/31/2017  Admitted From: home  Recommendations for Outpatient Follow-up:  1. Follow up with PCP in 1-2 weeks 2. Please obtain BMP/CBC in one week  Home Health:yes Equipment/Devices none  Discharge Condition:stable CODE STATUS: full Diet recommendation:cardiac  Brief/Interim Summary:83 y.o.femalewith history ofatrial fibrillation, coronary artery diseases/p CABG,sCHF with EFof 25-30%, hypertension, memory loss,history of uterine cancer, and more recently diagnosed left renal mass. The patient was incidentally found to have a 3.2 cm enhancing mass of the left lower kidney in September of this year. She did not have any metastatic disease on imaging and was referred for an ablation by radiology. She presented on 12/17 and underwent left renal cryoablation. Postprocedure she developed a perinephric hematoma. Her baseline hemoglobin is 12-13 g/dL. Postprocedure it dropped to 9.7 g/dL and is now down to 8.6 g/dL. Repeat CT scan on 12/18 showed that the hematomahasincreased compared to yesterday.Shehas developed some dizziness, fatigue, lower extremity weakness and lower abdominal pressure since her procedure. We were consulted by [facility] assist with her postoperative complication given her multiple comorbidities. Patient's family states that she stopped her Eliquis 2 days prior to surgery and she did not take any aspirin or ibuprofen in the subsequent days.    Discharge Diagnoses:  Principal Problem:   Traumatic perinephric hematoma of left kidney Active Problems:   Essential hypertension   Ischemic cardiomyopathy  EF 30% cath 4/14 with stent   Chronic combined systolic and diastolic congestive heart failure (HCC)   Paroxysmal atrial fibrillation (HCC)   CAD (coronary artery disease)   Overactive bladder   Left  renal mass   Acute blood loss as cause of postoperative anemia  Perinephric hematomaextending into the pelvis, likely contributing to her lower extremity weakness and sensation of pressure in the lower abdomen.her hemoglobin remained stable without need for blood transfusion.she is seen by ptot recommend home health pt/ot   AKI and mild hematuria likely related to recent ablation - creatinine back to baseline at the time of discharge.  Left renal mass status post left renal cryoablation on 12/17-folow up with dr Kathlene Cote. Paroxysmal atrial fibrillation, rate controlled generally - HoldingEliquis - Followed by Dr. Caryl Comes as an outpatient  Memory loss, at risk for delirium - Continue pyridostigmine  CAD s/p CABG -Patient no longer on aspirin,statin,beta-blocker  Chronic combined systolic and diastolic congestive heart failure with an ejection fraction of 25-30% -Patient not normally on diuretics or beta blocker -Hold lisinopril -Judicioususe of IV fluids - carefully trend uop  HTN,with relative hypotension -Hold lisinopril  Bladder spasms/overactive bladder -Holdmyrbetriqfor now pending bladder scan - Bladder scan and place foley if >334mL retained urine  Depression, stable, continue lexapro      Discharge Instructions   Allergies as of 03/31/2017      Reactions   Aricept [donepezil] Shortness Of Breath, Other (See Comments)   Hallucinations and loss of sensation in legs   Nitroglycerin Other (See Comments)   Oral spray form causes rapid drop in blood pressure.        Medication List    STOP taking these medications   ELIQUIS 5 MG Tabs tablet Generic drug:  apixaban     TAKE these medications   escitalopram 20 MG tablet Commonly known as:  LEXAPRO TAKE 1 TABLET(20 MG) BY MOUTH DAILY What changed:  See the new instructions.   HYDROcodone-acetaminophen 5-325 MG tablet Commonly known as:  NORCO/VICODIN  Take 1-2 tablets by mouth  every 6 (six) hours as needed for moderate pain.   lisinopril 2.5 MG tablet Commonly known as:  PRINIVIL,ZESTRIL TAKE 1 TABLET(2.5 MG) BY MOUTH DAILY What changed:  See the new instructions.   mirabegron ER 25 MG Tb24 tablet Commonly known as:  MYRBETRIQ Take 1 tablet (25 mg total) by mouth daily.   polyethylene glycol packet Commonly known as:  MIRALAX / GLYCOLAX Take 17 g by mouth daily. Start taking on:  04/01/2017   pyridostigmine 60 MG tablet Commonly known as:  MESTINON TAKE 1 TABLET(60 MG) BY MOUTH TWICE DAILY   senna-docusate 8.6-50 MG tablet Commonly known as:  Senokot-S Take 1 tablet by mouth daily as needed for mild constipation.      Follow-up Information    Marin Olp, MD Follow up.   Specialty:  Family Medicine Contact information: [place] [address] [phone]        Aletta Edouard, MD Follow up.   Specialties:  Interventional Radiology, Radiology Contact information: [address] [place] [address] [phone]          Allergies  Allergen Reactions  . Aricept [Donepezil] Shortness Of Breath and Other (See Comments)    Hallucinations and loss of sensation in legs  . Nitroglycerin Other (See Comments)    Oral spray form causes rapid drop in blood pressure.      Consultations:   Procedures/Studies: Ct Abdomen Pelvis Wo Contrast  Result Date: 03/29/2017 CLINICAL DATA:  Status post left renal cryo ablation for mass yesterday. Drop in hemoglobin. EXAM: CT ABDOMEN AND PELVIS WITHOUT CONTRAST TECHNIQUE: Multidetector CT imaging of the abdomen and pelvis was performed following the standard protocol without IV contrast. COMPARISON:  Abdominal CT 01/11/2017 and abdominopelvic CT 12/30/2016. Images from biopsy 03/28/2017. FINDINGS: Lower chest: New small left-greater-than-right pleural effusions with mild dependent left lower lobe atelectasis. The heart is enlarged status post median sternotomy  and pacemaker placement. Hepatobiliary: No ectatic abnormalities are identified on noncontrast imaging. Vicarious excretion of contrast is demonstrated within the gallbladder lumen. No evidence of gallbladder wall thickening or biliary dilatation. Pancreas: Atrophied without evidence of acute hemorrhage, ductal dilatation or surrounding inflammation. Spleen: Normal in size without focal abnormality. Adrenals/Urinary Tract: Both adrenal glands appear normal. The right kidney appears unremarkable. Subcapsular and perinephric hematoma on the left has progressed from yesterday. There is a subcapsular component measuring up to 1.8 cm in thickness. Perinephric components extend into the left posterior pararenal space and inferiorly into the pelvis in the posterior perirectal space. There is a persistent nephrogram on the left which is disrupted by the underlying known mass in the lower pole. No active bleeding evident. There is no hydronephrosis. Contrast material is present in the urinary bladder. Stomach/Bowel: No evidence of bowel wall thickening, distention or surrounding inflammatory change. Vascular/Lymphatic: There are no enlarged abdominal or pelvic lymph nodes. Aortic and branch vessel atherosclerosis. No acute vascular findings on noncontrast imaging. Reproductive: Hysterectomy. No evidence of adnexal mass. There is a small amount of free pelvic fluid. No generalized ascites or free air. Other: As above, inferior extension of perinephric hematoma into the perirectal fat. Small amount of free pelvic fluid. Musculoskeletal: No acute or significant osseous findings. IMPRESSION: 1. Progression of the known left renal subcapsular and perinephric hematoma since cryoablation procedure yesterday. Hemorrhage extends into the perirectal fat, and there is a small amount of free pelvic fluid. 2. Associated persistent nephrogram on the left.  No hydronephrosis. 3.  Small bilateral pleural effusions with left lower lobe  atelectasis. 4.  Aortic Atherosclerosis (ICD10-I70.0). 5. These results were called by telephone at the time of interpretation on 03/29/2017 at 5:00 pm to [facility] , who verbally acknowledged these results. Electronically Signed   By: Richardean Sale M.D.   On: 03/29/2017 17:13   Dg Chest 1 View  Result Date: 03/22/2017 CLINICAL DATA:  Preoperative study.  Renal cryoablation . EXAM: CHEST 1 VIEW COMPARISON:  Chest x-ray 08/17/2013. FINDINGS: Cardiac pace scratched it AICD noted with lead tips in stable position. Prior CABG. Stable cardiomegaly. No pulmonary venous congestion. No focal infiltrate. Mild stable basal pleural thickening noted consistent scarring. Mild thoracolumbar spine scoliosis. Mild thoracic spine scoliosis. IMPRESSION: No acute cardiopulmonary disease. AICD in stable position. Prior CABG. Heart size stable. Electronically Signed   By: Marcello Moores  Register   On: 03/22/2017 12:58   Ct Guide Tissue Ablation  Result Date: 03/28/2017 CLINICAL DATA:  3.4 cm mass of the left kidney with imaging characteristics consistent with renal carcinoma. The patient presents for biopsy and ablation under CT guidance. EXAM: CT-GUIDED CORE BIOPSY OF LEFT RENAL MASS CT-GUIDED PERCUTANEOUS CRYOABLATION OF LEFT RENAL MASS ANESTHESIA/SEDATION: General MEDICATIONS: 2 g IV Ancef. The antibiotic was administered in an appropriate time interval prior to needle puncture of the skin. CONTRAST:  72mL ISOVUE-370 IOPAMIDOL (ISOVUE-370) INJECTION 76% PROCEDURE: The procedure, risks, benefits, and alternatives were explained to the patient. Questions regarding the procedure were encouraged and answered. The patient understands and consents to the procedure. A time-out was performed prior to initiating the procedure. The patient was placed under general anesthesia. Initial unenhanced CT was performed in a prone position to localize the left kidney. The patient was prepped with chlorhexidine in a sterile fashion, and a  sterile drape was applied covering the operative field. A sterile gown and sterile gloves were used for the procedure. Contrast enhanced CT was performed of the kidneys with administration of IV contrast. A 17 gauge trocar needle was advanced into the left renal mass. Core biopsy was performed with an 18 gauge automated core biopsy device. A single core biopsy sample was submitted in formalin for pathologic analysis. After biopsy, a slurry of Gel-Foam pledgets mixed in sterile saline was injected through the 17 gauge needle as it was removed. Under CT guidance, a series of 3 separate [employer] CX percutaneous cryoablation probes were advanced into the left renal mass. Probe positioning was confirmed by CT prior to cryoablation. Cryoablation was performed through the 3 probes simultaneously. Initial 10 minute cycle of cryoablation was performed. This was followed by a 8 minute thaw cycle. A second 10 minute cycle of cryoablation was then performed. During ablation, periodic CT imaging was performed to monitor ice ball formation and morphology. After active thaw, a 30 second cautery cycle was performed and the cryoablation probes were then removed. COMPLICATIONS: Perinephric hemorrhage and hematuria. SIR level A: No therapy, no consequence. FINDINGS: Posterior mid to lower left renal mass was localized and after administration of contrast demonstrates avid enhancement. The mass measures approximately 3 x 3.5 cm in greatest transverse dimensions. Core biopsy yielded solid tissue. During biopsy and cryoablation probe placement, there was some perinephric bleeding present posterior to the kidney. Once ablation was begun, the amount of bleeding did not appear to increase significantly. During ablation, CT demonstrates adequate ice ball formation that appears to encompass the margins of the tumor. During the second freeze, it did become evident that there was development of some hematuria  in the Foley catheter  drainage bag. This will be followed. IMPRESSION: CT guided core biopsy and cryoablation of 3.5 cm left renal mass. The procedure was complicated by posterior perinephric hemorrhage and hematuria. The patient will be observed overnight. Initial follow-up will be performed in approximately 4 weeks. Electronically Signed   By: Aletta Edouard M.D.   On: 03/28/2017 11:53    (Echo, Carotid, EGD, Colonoscopy, ERCP)    Subjective:   Discharge Exam: Vitals:   03/30/17 2025 03/31/17 0553  BP: (!) 143/63 (!) 155/80  Pulse: 78 81  Resp: 18 18  Temp: 98.5 F (36.9 C) 99.5 F (37.5 C)  SpO2: 95% 95%   Vitals:   03/30/17 1501 03/30/17 1626 03/30/17 2025 03/31/17 0553  BP: (!) 145/53  (!) 143/63 (!) 155/80  Pulse: 69  78 81  Resp: 19  18 18   Temp: (!) 100.8 F (38.2 C) 99.8 F (37.7 C) 98.5 F (36.9 C) 99.5 F (37.5 C)  TempSrc: Oral Oral Oral Oral  SpO2: 97%  95% 95%  Weight:      Height:        General: Pt is alert, awake, not in acute distress Cardiovascular: RRR, S1/S2 +, no rubs, no gallops Respiratory: CTA bilaterally, no wheezing, no rhonchi Abdominal: Soft, NT, ND, bowel sounds + Extremities: no edema, no cyanosis    The results of significant diagnostics from this hospitalization (including imaging, microbiology, ancillary and laboratory) are listed below for reference.     Microbiology: No results found for this or any previous visit (from the past 240 hour(s)).   Labs: BNP (last 3 results) No results for input(s): BNP in the last 8760 hours. Basic Metabolic Panel: Recent Labs  Lab 03/29/17 0337 03/30/17 0150 03/31/17 0645  NA 138 137 139  K 4.1 4.0 3.7  CL 105 107 108  CO2 27 25 27   GLUCOSE 135* 111* 106*  BUN 20 21* 12  CREATININE 1.28* 0.98 0.71  CALCIUM 8.0* 7.7* 8.0*   Liver Function Tests: No results for input(s): AST, ALT, ALKPHOS, BILITOT, PROT, ALBUMIN in the last 168 hours. No results for input(s): LIPASE, AMYLASE in the last 168 hours. No  results for input(s): AMMONIA in the last 168 hours. CBC: Recent Labs  Lab 03/29/17 0337 03/29/17 1420 03/29/17 1937 03/30/17 0150 03/30/17 0846 03/31/17 0645  WBC 12.7* 9.9  --  8.7  --  8.0  NEUTROABS 10.1* 7.8*  --   --   --   --   HGB 9.7* 8.6* 8.7* 8.5* 8.3* 8.4*  HCT 30.1* 25.7* 26.6* 25.8* 25.6* 25.5*  MCV 90.1 90.5  --  91.5  --  90.7  PLT 123* 91*  --  94*  --  78*   Cardiac Enzymes: No results for input(s): CKTOTAL, CKMB, CKMBINDEX, TROPONINI in the last 168 hours. BNP: Invalid input(s): POCBNP CBG: No results for input(s): GLUCAP in the last 168 hours. D-Dimer No results for input(s): DDIMER in the last 72 hours. Hgb A1c No results for input(s): HGBA1C in the last 72 hours. Lipid Profile No results for input(s): CHOL, HDL, LDLCALC, TRIG, CHOLHDL, LDLDIRECT in the last 72 hours. Thyroid function studies No results for input(s): TSH, T4TOTAL, T3FREE, THYROIDAB in the last 72 hours.  Invalid input(s): FREET3 Anemia work up No results for input(s): VITAMINB12, FOLATE, FERRITIN, TIBC, IRON, RETICCTPCT in the last 72 hours. Urinalysis    Component Value Date/Time   COLORURINE YELLOW 12/29/2016 1558   APPEARANCEUR CLEAR 12/29/2016 1558   LABSPEC  1.020 12/29/2016 1558   PHURINE 6.5 12/29/2016 1558   GLUCOSEU NEGATIVE 12/29/2016 1558   HGBUR TRACE-INTACT (A) 12/29/2016 1558   BILIRUBINUR NEGATIVE 12/29/2016 1558   BILIRUBINUR negative 04/21/2016 1157   KETONESUR NEGATIVE 12/29/2016 1558   PROTEINUR negative 04/21/2016 1157   PROTEINUR NEGATIVE 07/15/2012 0400   UROBILINOGEN 0.2 12/29/2016 1558   NITRITE NEGATIVE 12/29/2016 1558   LEUKOCYTESUR NEGATIVE 12/29/2016 1558   Sepsis Labs Invalid input(s): PROCALCITONIN,  WBC,  LACTICIDVEN Microbiology No results found for this or any previous visit (from the past 240 hour(s)).   Time coordinating discharge: Over 30 minutes  SIGNED:   Georgette Shell, MD  [facility] 03/31/2017, 10:13 AM Pager    If 7PM-7AM, please contact night-coverage [email or web address] Password [email or web address]

## 2017-03-31 NOTE — Care Management Note (Signed)
Case Management Note  Patient Details  Name: [name] [name] MRN: [id number] Date of Birth: 10/30/1931  Subjective/Objective:     Traumatic perinephric hematoma of the left kidney               Action/Plan: Plan to discharge home with [name].    Expected Discharge Date:  03/31/17               Expected Discharge Plan:  [place]  In-House Referral:     Discharge planning Services  CM Consult  Post Acute Care Choice:    Choice offered to:  Adult Children  DME Arranged:    DME Agency:     HH Arranged:  PT, OT [place] Agency:  [facility]  Status of Service:  Completed, signed off  If discussed at [place] of Stay Meetings, dates discussed:    Additional CommentsPurcell Mouton, RN 03/31/2017, 11:16 AM

## 2017-03-31 NOTE — Telephone Encounter (Signed)
I spoke with Dr. Kathlene Cote  She had a significant hematoma following the procedure.  He states that no anticoagulation for at least a week or probably be better.  Her CHADS VASc score is associate with an annualized risk of about 7-8%.  imply a weekly risk approximately 0.13% he thinks her risk of bleeding is significantly higher than that.  Nobody knows more about coagulations in town probably been her son-in-law I would be glad to talk about this with him or her daughter [phone]

## 2017-03-31 NOTE — Telephone Encounter (Signed)
Please call the patient to advise regarding a lab order request.   Copied from [place] [phone]. Topic: General - Other >> Mar 31, 2017  2:43 PM Carolyn Stare wrote:   Pt daughter would like a call back she said her Mom need to have some labs drawn and I tild her Dr Yong Channel would need to ok and put an order in   Anselm Lis [phone]

## 2017-03-31 NOTE — Progress Notes (Signed)
Pt son-in-law stating he had questions for the doctor.  Writer asked son-in-law what the questions were and would find out from MD.  Stated that MD was here this morning and had already updated both sisters (one which was his wife).  Rn answered son-in-laws questions.  Went over discharge papers with pt and family.  Prescription given to son-in-law.  VSS.  Pt wheeled out via NT.

## 2017-03-31 NOTE — Discharge Instructions (Signed)
Check cbc and bmp in 4 days.

## 2017-03-31 NOTE — Telephone Encounter (Signed)
New Message      Patient had surgery for Kidney cancer and they took her off of her eliquis, now she is having a lot of bleeding hemoglobin dropped from 14 to 8 , she was discharged today and they do not want her taking the eliquis for another week , would like follow up and recommendation on when to start the eliquis again.

## 2017-04-01 ENCOUNTER — Ambulatory Visit: Payer: Medicare Other | Admitting: Family Medicine

## 2017-04-01 ENCOUNTER — Telehealth: Payer: Self-pay | Admitting: *Deleted

## 2017-04-01 NOTE — Telephone Encounter (Signed)
Left voicemail requesting call back for TCM.

## 2017-04-01 NOTE — Telephone Encounter (Signed)
I spoke with [name] and gave her information from Dr. Caryl Comes. Pt is seeing [name] on 04/11/17.

## 2017-04-04 ENCOUNTER — Inpatient Hospital Stay (HOSPITAL_COMMUNITY)
Admission: EM | Admit: 2017-04-04 | Discharge: 2017-04-08 | DRG: 194 | Disposition: A | Discharge status: Skilled Nursing Facility | Payer: Medicare Other | Attending: Internal Medicine | Admitting: Internal Medicine

## 2017-04-04 ENCOUNTER — Emergency Department (HOSPITAL_COMMUNITY): Payer: Medicare Other

## 2017-04-04 ENCOUNTER — Other Ambulatory Visit: Payer: Self-pay

## 2017-04-04 ENCOUNTER — Emergency Department (HOSPITAL_COMMUNITY)
Admission: EM | Admit: 2017-04-04 | Discharge: 2017-04-04 | Disposition: A | Discharge status: Home / Self Care | Payer: Medicare Other | Source: Home / Self Care | Attending: Emergency Medicine | Admitting: Emergency Medicine

## 2017-04-04 ENCOUNTER — Encounter: Payer: Self-pay | Admitting: Family Medicine

## 2017-04-04 ENCOUNTER — Ambulatory Visit: Payer: Medicare Other | Admitting: Family Medicine

## 2017-04-04 VITALS — BP 122/72 | HR 79 | Temp 99.2°F | Ht 68.0 in | Wt 136.0 lb

## 2017-04-04 DIAGNOSIS — Z85820 Personal history of malignant melanoma of skin: Secondary | ICD-10-CM

## 2017-04-04 DIAGNOSIS — I252 Old myocardial infarction: Secondary | ICD-10-CM | POA: Diagnosis not present

## 2017-04-04 DIAGNOSIS — J440 Chronic obstructive pulmonary disease with acute lower respiratory infection: Secondary | ICD-10-CM | POA: Diagnosis present

## 2017-04-04 DIAGNOSIS — Z79899 Other long term (current) drug therapy: Secondary | ICD-10-CM | POA: Diagnosis not present

## 2017-04-04 DIAGNOSIS — I10 Essential (primary) hypertension: Secondary | ICD-10-CM

## 2017-04-04 DIAGNOSIS — R5082 Postprocedural fever: Secondary | ICD-10-CM | POA: Diagnosis present

## 2017-04-04 DIAGNOSIS — E876 Hypokalemia: Secondary | ICD-10-CM | POA: Diagnosis present

## 2017-04-04 DIAGNOSIS — I480 Paroxysmal atrial fibrillation: Secondary | ICD-10-CM | POA: Diagnosis present

## 2017-04-04 DIAGNOSIS — J189 Pneumonia, unspecified organism: Secondary | ICD-10-CM | POA: Diagnosis present

## 2017-04-04 DIAGNOSIS — I5042 Chronic combined systolic (congestive) and diastolic (congestive) heart failure: Secondary | ICD-10-CM | POA: Diagnosis present

## 2017-04-04 DIAGNOSIS — R531 Weakness: Secondary | ICD-10-CM | POA: Diagnosis present

## 2017-04-04 DIAGNOSIS — Z7901 Long term (current) use of anticoagulants: Secondary | ICD-10-CM

## 2017-04-04 DIAGNOSIS — D62 Acute posthemorrhagic anemia: Secondary | ICD-10-CM | POA: Diagnosis present

## 2017-04-04 DIAGNOSIS — J9811 Atelectasis: Secondary | ICD-10-CM | POA: Diagnosis present

## 2017-04-04 DIAGNOSIS — Z87448 Personal history of other diseases of urinary system: Secondary | ICD-10-CM

## 2017-04-04 DIAGNOSIS — Z951 Presence of aortocoronary bypass graft: Secondary | ICD-10-CM | POA: Diagnosis not present

## 2017-04-04 DIAGNOSIS — I110 Hypertensive heart disease with heart failure: Secondary | ICD-10-CM | POA: Diagnosis present

## 2017-04-04 DIAGNOSIS — M79671 Pain in right foot: Secondary | ICD-10-CM

## 2017-04-04 DIAGNOSIS — M79609 Pain in unspecified limb: Secondary | ICD-10-CM

## 2017-04-04 DIAGNOSIS — N2889 Other specified disorders of kidney and ureter: Secondary | ICD-10-CM

## 2017-04-04 DIAGNOSIS — Z9889 Other specified postprocedural states: Secondary | ICD-10-CM

## 2017-04-04 DIAGNOSIS — R0789 Other chest pain: Secondary | ICD-10-CM | POA: Diagnosis present

## 2017-04-04 DIAGNOSIS — L03115 Cellulitis of right lower limb: Secondary | ICD-10-CM | POA: Diagnosis present

## 2017-04-04 DIAGNOSIS — R600 Localized edema: Secondary | ICD-10-CM | POA: Diagnosis not present

## 2017-04-04 DIAGNOSIS — I255 Ischemic cardiomyopathy: Secondary | ICD-10-CM | POA: Diagnosis present

## 2017-04-04 DIAGNOSIS — R109 Unspecified abdominal pain: Secondary | ICD-10-CM | POA: Diagnosis not present

## 2017-04-04 DIAGNOSIS — M109 Gout, unspecified: Secondary | ICD-10-CM | POA: Diagnosis present

## 2017-04-04 DIAGNOSIS — R627 Adult failure to thrive: Secondary | ICD-10-CM | POA: Diagnosis present

## 2017-04-04 DIAGNOSIS — W19XXXA Unspecified fall, initial encounter: Secondary | ICD-10-CM | POA: Diagnosis present

## 2017-04-04 DIAGNOSIS — I5020 Unspecified systolic (congestive) heart failure: Secondary | ICD-10-CM | POA: Diagnosis present

## 2017-04-04 DIAGNOSIS — G9340 Encephalopathy, unspecified: Secondary | ICD-10-CM | POA: Diagnosis not present

## 2017-04-04 DIAGNOSIS — Y95 Nosocomial condition: Secondary | ICD-10-CM | POA: Diagnosis present

## 2017-04-04 DIAGNOSIS — Z950 Presence of cardiac pacemaker: Secondary | ICD-10-CM | POA: Diagnosis not present

## 2017-04-04 DIAGNOSIS — X58XXXA Exposure to other specified factors, initial encounter: Secondary | ICD-10-CM | POA: Diagnosis present

## 2017-04-04 DIAGNOSIS — Z8679 Personal history of other diseases of the circulatory system: Secondary | ICD-10-CM

## 2017-04-04 DIAGNOSIS — R402413 Glasgow coma scale score 13-15, at hospital admission: Secondary | ICD-10-CM | POA: Diagnosis present

## 2017-04-04 DIAGNOSIS — Z888 Allergy status to other drugs, medicaments and biological substances status: Secondary | ICD-10-CM

## 2017-04-04 DIAGNOSIS — I2510 Atherosclerotic heart disease of native coronary artery without angina pectoris: Secondary | ICD-10-CM | POA: Diagnosis present

## 2017-04-04 DIAGNOSIS — S37092A Other injury of left kidney, initial encounter: Secondary | ICD-10-CM | POA: Diagnosis not present

## 2017-04-04 DIAGNOSIS — Z955 Presence of coronary angioplasty implant and graft: Secondary | ICD-10-CM

## 2017-04-04 HISTORY — DX: Pneumonia, unspecified organism: J18.9

## 2017-04-04 LAB — URINALYSIS, ROUTINE W REFLEX MICROSCOPIC
Bilirubin Urine: NEGATIVE
GLUCOSE, UA: NEGATIVE mg/dL
Ketones, ur: 5 mg/dL — AB
NITRITE: NEGATIVE
PH: 6 (ref 5.0–8.0)
Protein, ur: 100 mg/dL — AB
SPECIFIC GRAVITY, URINE: 1.021 (ref 1.005–1.030)

## 2017-04-04 LAB — CBC
HCT: 25.7 % — ABNORMAL LOW (ref 35.0–45.0)
HCT: 27.1 % — ABNORMAL LOW (ref 36.0–46.0)
HEMOGLOBIN: 8.6 g/dL — AB (ref 11.7–15.5)
Hemoglobin: 8.8 g/dL — ABNORMAL LOW (ref 12.0–15.0)
MCH: 29.6 pg (ref 27.0–33.0)
MCH: 28.9 pg (ref 26.0–34.0)
MCHC: 33.5 g/dL (ref 32.0–36.0)
MCHC: 32.5 g/dL (ref 30.0–36.0)
MCV: 88.3 fL (ref 80.0–100.0)
MCV: 89.1 fL (ref 78.0–100.0)
MPV: 11.7 fL (ref 7.5–12.5)
PLATELETS: 173 10*3/uL (ref 140–400)
PLATELETS: 189 10*3/uL (ref 150–400)
RBC: 2.91 10*6/uL — AB (ref 3.80–5.10)
RBC: 3.04 MIL/uL — ABNORMAL LOW (ref 3.87–5.11)
RDW: 12.8 % (ref 11.0–15.0)
RDW: 13.5 % (ref 11.5–15.5)
WBC: 10 10*3/uL (ref 3.8–10.8)
WBC: 10.1 10*3/uL (ref 4.0–10.5)

## 2017-04-04 LAB — BASIC METABOLIC PANEL
Anion gap: 7 (ref 5–15)
BUN: 14 mg/dL (ref 7–25)
BUN: 14 mg/dL (ref 6–20)
CALCIUM: 8.1 mg/dL — AB (ref 8.6–10.4)
CALCIUM: 8.2 mg/dL — AB (ref 8.9–10.3)
CO2: 27 mmol/L (ref 20–32)
CO2: 29 mmol/L (ref 22–32)
CREATININE: 0.68 mg/dL (ref 0.44–1.00)
Chloride: 98 mmol/L (ref 98–110)
Chloride: 98 mmol/L — ABNORMAL LOW (ref 101–111)
Creat: 0.69 mg/dL (ref 0.60–0.88)
GFR calc Af Amer: >60 mL/min (ref >60)
GLUCOSE: 110 mg/dL — AB (ref 65–99)
GLUCOSE: 113 mg/dL — AB (ref 65–99)
POTASSIUM: 4.2 mmol/L (ref 3.5–5.3)
Potassium: 3.4 mmol/L — ABNORMAL LOW (ref 3.5–5.1)
SODIUM: 134 mmol/L — AB (ref 135–146)
SODIUM: 134 mmol/L — AB (ref 135–145)

## 2017-04-04 LAB — I-STAT CG4 LACTIC ACID, ED: Lactic Acid, Venous: 0.88 mmol/L (ref 0.5–1.9)

## 2017-04-04 LAB — I-STAT TROPONIN, ED: Troponin i, poc: 0.02 ng/mL (ref 0.00–0.08)

## 2017-04-04 LAB — URIC ACID: URIC ACID, SERUM: 3.3 mg/dL (ref 2.3–6.6)

## 2017-04-04 LAB — CBG MONITORING, ED: GLUCOSE-CAPILLARY: 111 mg/dL — AB (ref 65–99)

## 2017-04-04 LAB — STREP PNEUMONIAE URINARY ANTIGEN: STREP PNEUMO URINARY ANTIGEN: NEGATIVE

## 2017-04-04 MED ORDER — APIXABAN 5 MG PO TABS
5.0000 mg | ORAL_TABLET | Freq: Two times a day (BID) | ORAL | Status: DC
  Administered 2017-04-05 – 2017-04-08 (×7): 5 mg via ORAL
  Filled 2017-04-04 (×7): qty 1

## 2017-04-04 MED ORDER — MIRABEGRON ER 25 MG PO TB24
25.0000 mg | ORAL_TABLET | Freq: Every day | ORAL | Status: DC
  Administered 2017-04-05 – 2017-04-08 (×4): 25 mg via ORAL
  Filled 2017-04-04 (×4): qty 1

## 2017-04-04 MED ORDER — SODIUM CHLORIDE 0.9 % IV SOLN
Freq: Once | INTRAVENOUS | Status: AC
  Administered 2017-04-04: 18:00:00 via INTRAVENOUS

## 2017-04-04 MED ORDER — PIPERACILLIN-TAZOBACTAM 3.375 G IVPB 30 MIN
3.3750 g | Freq: Once | INTRAVENOUS | Status: AC
  Administered 2017-04-04: 3.375 g via INTRAVENOUS
  Filled 2017-04-04 (×2): qty 50

## 2017-04-04 MED ORDER — ACETAMINOPHEN 325 MG PO TABS
650.0000 mg | ORAL_TABLET | Freq: Four times a day (QID) | ORAL | Status: DC | PRN
  Administered 2017-04-05 – 2017-04-07 (×2): 650 mg via ORAL
  Filled 2017-04-04 (×2): qty 2

## 2017-04-04 MED ORDER — IOPAMIDOL (ISOVUE-300) INJECTION 61%
INTRAVENOUS | Status: AC
  Filled 2017-04-04: qty 100

## 2017-04-04 MED ORDER — IOPAMIDOL (ISOVUE-300) INJECTION 61%
100.0000 mL | Freq: Once | INTRAVENOUS | Status: AC | PRN
  Administered 2017-04-04: 100 mL via INTRAVENOUS

## 2017-04-04 MED ORDER — VANCOMYCIN HCL IN DEXTROSE 1-5 GM/200ML-% IV SOLN
1000.0000 mg | Freq: Once | INTRAVENOUS | Status: AC
  Administered 2017-04-04: 1000 mg via INTRAVENOUS
  Filled 2017-04-04 (×2): qty 200

## 2017-04-04 MED ORDER — PIPERACILLIN-TAZOBACTAM 3.375 G IVPB
3.3750 g | Freq: Three times a day (TID) | INTRAVENOUS | Status: DC
  Administered 2017-04-05: 3.375 g via INTRAVENOUS
  Filled 2017-04-04 (×4): qty 50

## 2017-04-04 MED ORDER — HYDROCODONE-ACETAMINOPHEN 5-325 MG PO TABS: 0.5000 | ORAL_TABLET | Freq: Four times a day (QID) | ORAL | Status: DC | PRN

## 2017-04-04 MED ORDER — HYDROCODONE-ACETAMINOPHEN 5-325 MG PO TABS
0.5000 | ORAL_TABLET | Freq: Four times a day (QID) | ORAL | Status: DC | PRN
  Administered 2017-04-08: 0.5 via ORAL
  Filled 2017-04-04: qty 1

## 2017-04-04 MED ORDER — LISINOPRIL 5 MG PO TABS
2.5000 mg | ORAL_TABLET | Freq: Every day | ORAL | Status: DC
  Administered 2017-04-04 – 2017-04-07 (×4): 2.5 mg via ORAL
  Filled 2017-04-04 (×4): qty 1

## 2017-04-04 MED ORDER — PYRIDOSTIGMINE BROMIDE 60 MG PO TABS
60.0000 mg | ORAL_TABLET | Freq: Two times a day (BID) | ORAL | Status: DC
  Administered 2017-04-04 – 2017-04-08 (×8): 60 mg via ORAL
  Filled 2017-04-04 (×8): qty 1

## 2017-04-04 MED ORDER — ACETAMINOPHEN 325 MG PO TABS
650.0000 mg | ORAL_TABLET | Freq: Once | ORAL | Status: AC
  Administered 2017-04-04: 650 mg via ORAL
  Filled 2017-04-04: qty 2

## 2017-04-04 MED ORDER — ESCITALOPRAM OXALATE 20 MG PO TABS
20.0000 mg | ORAL_TABLET | Freq: Every day | ORAL | Status: DC
  Administered 2017-04-05 – 2017-04-07 (×3): 20 mg via ORAL
  Filled 2017-04-04: qty 1
  Filled 2017-04-04: qty 2
  Filled 2017-04-04: qty 1

## 2017-04-04 MED ORDER — VANCOMYCIN HCL IN DEXTROSE 750-5 MG/150ML-% IV SOLN
750.0000 mg | INTRAVENOUS | Status: DC
  Filled 2017-04-04: qty 150

## 2017-04-04 NOTE — ED Notes (Signed)
Bed: BF38 Expected date:  Expected time:  Means of arrival:  Comments: Held for 37

## 2017-04-04 NOTE — ED Notes (Signed)
Patient transported to CT 

## 2017-04-04 NOTE — ED Notes (Signed)
Vascular at bedside

## 2017-04-04 NOTE — H&P (Signed)
History and Physical    [name] [name] JKK:[id number] DOB: [birth year] DOA: 04/04/2017  PCP: [name] Olp, MD  Patient coming from: Home  I have personally briefly reviewed patient's old medical records in [place]  Chief Complaint: Generalized weakness, confusion  HPI: [name] [name] is a 83 y.o. female with medical history significant of ICM EF 30-35%, PAF on Eliquis which had been placed on hold for 1 week following surgery, cryoablation of L renal mass on 71/69 complicated by post-op anemia and perinephric hematoma.  Patient was discharged on 12/20.  Since discharge she has had progressive worsening generalized weakness, inability to ambulate, increasing confusion.  Mild pain, swelling, redness of R ankle.  No subjective fever (objectively has fever in ED).  No reported cough or SOB.   ED Course: Tm 101.2.  O2 sat of 95% on RA.  CXR shows atelectasis vs PNA of LLL.  CT abd/pelvis shows improvement in size of perinephric hematoma.  WBC 10.1, HGB 8.8 up from 8.4 on discharge.   Review of Systems: As per HPI otherwise 10 point review of systems negative.   Past Medical History:  Diagnosis Date  . Atrial fibrillation ([place]) 10/06/2007   post op  . COPD (chronic obstructive pulmonary disease) ([place])    Emphysema on 02/03/13 CXR  . CORONARY ARTERY DISEASE 10/06/2007   a. s/p PCI to LAD; b. s/p CABG; c. LHC 07/13/12: Proximal LAD stent patent, LIMA-LAD atretic, D1 occluded, proximal circumflex 30%, mid RCA occluded, SVG-D1 normal, SVG-OM2 normal, SVG-distal RCA normal, EF 40% with diffuse HK  . Dizziness   . History of colonic polyps 10/30/2009   No polyps in 2011. No repeat.    Marland Kitchen HYPERLIPIDEMIA 10/06/2007  . HYPERTENSION 10/06/2007  . LBBB 09/06/2008  . Left renal mass   . MELANOMA 09/06/2008   MOES PROCEDURE RIGHT  . MYOCARDIAL INFARCTION, HX OF 10/06/2007  . NICM (nonischemic cardiomyopathy) ([place])    Echocardiogram 07/12/12: EF 25-30%, diffuse HK, mild AI, mild MR, mild LAE    . PERSONAL HX COLONIC POLYPS 10/30/2009  . Presence of permanent cardiac pacemaker   . Syncope   . UTERINE CANCER, HX OF 09/06/2008  . VARICOSE VEIN 09/29/2009    Past Surgical History:  Procedure Laterality Date  . ABDOMINAL HYSTERECTOMY  1988  . BI-VENTRICULAR PACEMAKER INSERTION (CRT-P)  02/02/2013   St. Jude, serial no. #[id number]   . CARDIAC CATHETERIZATION  08/30/2008  . CORONARY ANGIOPLASTY WITH STENT PLACEMENT  2009  . CORONARY ARTERY BYPASS GRAFT  2004  . IR RADIOLOGIST EVAL & MGMT  02/08/2017  . LEFT HEART CATHETERIZATION WITH CORONARY ANGIOGRAM Bilateral 07/13/2012   Procedure: LEFT HEART CATHETERIZATION WITH CORONARY ANGIOGRAM;  Surgeon: [name] M Martinique, MD;  Location: [facility] CATH LAB;  Service: Cardiovascular;  Laterality: Bilateral;  . OOPHORECTOMY    . PERMANENT PACEMAKER INSERTION N/A 02/02/2013   Procedure: PERMANENT PACEMAKER INSERTION;  Surgeon: [name] Lance, MD;  Location[facility] CATH LAB;  Service: Cardiovascular;  Laterality: N/A;  . RADIOLOGY WITH ANESTHESIA Left 03/28/2017   Procedure: RENAL CRYO ABLATION;  Surgeon: [name] Edouard, MD;  Location: [facility] ORS;  Service: Radiology;  Laterality: Left;  . right distal pretibeal     melanoma     reports that  has never smoked. she has never used smokeless tobacco. She reports that she drinks alcohol. She reports that she does not use drugs.  Allergies  Allergen Reactions  . Aricept [Donepezil] Shortness Of Breath and Other (See Comments)    Hallucinations and  loss of sensation in legs  . Nitroglycerin Other (See Comments)    Oral spray form causes rapid drop in blood pressure.      Family History  Problem Relation Age of Onset  . Stroke Mother   . Heart attack Father   . Colon cancer Neg Hx      Prior to Admission medications   Medication Sig Start Date End Date Taking? Authorizing Provider  escitalopram (LEXAPRO) 20 MG tablet TAKE 1 TABLET(20 MG) BY MOUTH DAILY Patient taking differently: TAKE 1 TABLET(20 MG) BY  MOUTH at bedtime 03/07/17  Yes [name] Olp, MD  HYDROcodone-acetaminophen (NORCO/VICODIN) 5-325 MG tablet Take 1-2 tablets by mouth every 6 (six) hours as needed for moderate pain. 03/31/17  Yes [name] Shell, MD  lisinopril (PRINIVIL,ZESTRIL) 2.5 MG tablet TAKE 1 TABLET(2.5 MG) BY MOUTH DAILY Patient taking differently: TAKE 1 TABLET(2.5 MG) BY MOUTH at bedtime 05/13/16  Yes Deboraha Sprang, MD  mirabegron ER (MYRBETRIQ) 25 MG TB24 tablet Take 1 tablet (25 mg total) by mouth daily. 11/04/16  Yes [name] Olp, MD  pyridostigmine (MESTINON) 60 MG tablet TAKE 1 TABLET(60 MG) BY MOUTH TWICE DAILY 10/27/16  Yes [name] Olp, MD  ELIQUIS 5 MG TABS tablet Take 5 mg by mouth daily. 03/06/17   [provider]    Physical Exam: Vitals:   04/04/17 1833 04/04/17 1845 04/04/17 1851 04/04/17 2012  BP:   136/62 (!) 139/110  Pulse:   82 73  Resp:   19 18  Temp: (!) 101.2 F (38.4 C)     TempSrc: Rectal     SpO2:   94% 95%  Weight:  61.7 kg (136 lb)    Height:  5\' 8"  (1.727 m)      Constitutional: NAD, calm, comfortable Eyes: PERRL, lids and conjunctivae normal ENMT: Mucous membranes are moist. Posterior pharynx clear of any exudate or lesions.Normal dentition.  Neck: normal, supple, no masses, no thyromegaly Respiratory: Few crackles left base Cardiovascular: Regular rate and rhythm, no murmurs / rubs / gallops. No extremity edema. 2+ pedal pulses. No carotid bruits.  Abdomen: no tenderness, no masses palpated. No hepatosplenomegaly. Bowel sounds positive.  Musculoskeletal: no clubbing / cyanosis. No joint deformity upper and lower extremities. Good ROM, no contractures. Normal muscle tone.  Skin: Mild edema of R malleolus, not really erythematous Neurologic: CN 2-12 grossly intact. Sensation intact, DTR normal. Strength 5/5 in all 4.  Psychiatric: Normal judgment and insight. Alert and oriented x 3. Normal mood.    Labs on Admission: I have personally reviewed  following labs and imaging studies  CBC: Recent Labs  Lab 03/29/17 0337 03/29/17 1420 03/29/17 1937 03/30/17 0150 03/30/17 0846 03/31/17 0645 04/04/17 1420  WBC 12.7* 9.9  --  8.7  --  8.0 10.1  NEUTROABS 10.1* 7.8*  --   --   --   --   --   HGB 9.7* 8.6* 8.7* 8.5* 8.3* 8.4* 8.8*  HCT 30.1* 25.7* 26.6* 25.8* 25.6* 25.5* 27.1*  MCV 90.1 90.5  --  91.5  --  90.7 89.1  PLT 123* 91*  --  94*  --  78* 932   Basic Metabolic Panel: Recent Labs  Lab 03/29/17 0337 03/30/17 0150 03/31/17 0645 04/04/17 1420  NA 138 137 139 134*  K 4.1 4.0 3.7 3.4*  CL 105 107 108 98*  CO2 27 25 27 29   GLUCOSE 135* 111* 106* 113*  BUN 20 21* 12 14  CREATININE 1.28* 0.98 0.71  0.68  CALCIUM 8.0* 7.7* 8.0* 8.2*   GFR: Estimated Creatinine Clearance: 51.9 mL/min (by C-G formula based on SCr of 0.68 mg/dL). Liver Function Tests: No results for input(s): AST, ALT, ALKPHOS, BILITOT, PROT, ALBUMIN in the last 168 hours. No results for input(s): LIPASE, AMYLASE in the last 168 hours. No results for input(s): AMMONIA in the last 168 hours. Coagulation Profile: Recent Labs  Lab 03/30/17 0150  INR 1.10   Cardiac Enzymes: No results for input(s): CKTOTAL, CKMB, CKMBINDEX, TROPONINI in the last 168 hours. BNP (last 3 results) No results for input(s): PROBNP in the last 8760 hours. HbA1C: No results for input(s): HGBA1C in the last 72 hours. CBG: Recent Labs  Lab 04/04/17 1626  GLUCAP 111*   Lipid Profile: No results for input(s): CHOL, HDL, LDLCALC, TRIG, CHOLHDL, LDLDIRECT in the last 72 hours. Thyroid Function Tests: No results for input(s): TSH, T4TOTAL, FREET4, T3FREE, THYROIDAB in the last 72 hours. Anemia Panel: No results for input(s): VITAMINB12, FOLATE, FERRITIN, TIBC, IRON, RETICCTPCT in the last 72 hours. Urine analysis:    Component Value Date/Time   COLORURINE AMBER (A) 04/04/2017 1713   APPEARANCEUR CLEAR 04/04/2017 1713   LABSPEC 1.021 04/04/2017 1713   PHURINE 6.0  04/04/2017 1713   GLUCOSEU NEGATIVE 04/04/2017 1713   GLUCOSEU NEGATIVE 12/29/2016 1558   HGBUR LARGE (A) 04/04/2017 1713   BILIRUBINUR NEGATIVE 04/04/2017 1713   BILIRUBINUR negative 04/21/2016 1157   KETONESUR 5 (A) 04/04/2017 1713   PROTEINUR 100 (A) 04/04/2017 1713   UROBILINOGEN 0.2 12/29/2016 1558   NITRITE NEGATIVE 04/04/2017 1713   LEUKOCYTESUR TRACE (A) 04/04/2017 1713    Radiological Exams on Admission: Dg Chest 2 View  Result Date: 04/04/2017 CLINICAL DATA:  Recent renal cryoablation with perinephric hematoma. Possible atelectasis. EXAM: CHEST  2 VIEW COMPARISON:  03/22/2017 FINDINGS: Sternotomy wires and left-sided pacemaker unchanged. Lungs are adequately inflated with mild left basilar opacification likely atelectasis and possible small amount of pleural fluid. Mild cardiomegaly. Remainder of the exam is unchanged. IMPRESSION: Minimal left base opacification likely atelectasis with small amount of pleural fluid. Electronically Signed   By: [name] [name] M.D.   On: 04/04/2017 17:25   Dg Ankle Complete Right  Result Date: 04/04/2017 CLINICAL DATA:  Pain and swelling in the right ankle for 1 day with bruising along the medial malleolus. EXAM: RIGHT ANKLE - COMPLETE 3+ VIEW COMPARISON:  None. FINDINGS: Abnormal soft tissue swelling overlying both the medial and lateral malleolus. Bony demineralization. Plafond and talar dome appear intact. No well-defined fracture. Plantar calcaneal spur with mild calcification in the plantar fascia and distal Achilles tendon. Atherosclerotic vascular calcifications noted. IMPRESSION: 1. Abnormal soft tissue swelling overlying both malleoli and along the heel, cause uncertain. 2. Atherosclerosis. 3. Plantar calcaneal spur.  Faint chondrocalcinosis. 4. Bony demineralization. Electronically Signed   By: Van Clines M.D.   On: 04/04/2017 14:39   Ct Abdomen Pelvis W Contrast  Result Date: 04/04/2017 CLINICAL DATA:  83 year old female status  post recent biopsy and cryoablation of the left renal mass complicated by hemorrhage. Continued flank pain. EXAM: CT ABDOMEN AND PELVIS WITH CONTRAST TECHNIQUE: Multidetector CT imaging of the abdomen and pelvis was performed using the standard protocol following bolus administration of intravenous contrast. CONTRAST:  116mL ISOVUE-300 IOPAMIDOL (ISOVUE-300) INJECTION 61% COMPARISON:  Multiple prior abdominal CTs dating back to 12/30/2016 FINDINGS: Lower chest: Partially visualized small bilateral pleural effusions with associated subsegmental atelectatic changes of the left lung base similar to the prior CT of 03/29/2017. There is  mild cardiomegaly. Cardiac pacemaker wires and CABG clips noted. No intraperitoneal free air or free fluid. Hepatobiliary: The liver is unremarkable. No intrahepatic biliary ductal dilatation. The gallbladder is partially contracted and appears unremarkable. Pancreas: Unremarkable. No pancreatic ductal dilatation or surrounding inflammatory changes. Spleen: Normal in size without focal abnormality. Adrenals/Urinary Tract: The adrenal glands are unremarkable. Status post cryoablation of left renal mid to lower pole mass with associated infarction. There is enhancement of the remainder of the left renal parenchyma. Smaller hypodense upper pole lesions are not well characterized but likely represent cysts. There is overall interval decrease in the size of the left perinephric hematoma compared to the study of 03/29/2017. No extravasated contrast noted to suggest active bleed. Small pockets of air again noted along the posterolateral aspect of the left kidney in the perirenal space as seen on the prior CT. There is decreased or interval resolution of the blood seen in the posterior pelvis on the prior CT. The right kidney is unremarkable. There is no hydronephrosis on either side. There is symmetric enhancement and excretion of contrast by both kidneys. The visualized ureters and urinary  bladder appear unremarkable. Stomach/Bowel: There is no bowel obstruction or active inflammation. The appendix appears unremarkable as visualized. Vascular/Lymphatic: There is moderate aortoiliac atherosclerotic disease. The origins of the celiac axis, SMA, IMA are patent. The SMV, splenic vein, and main portal vein are patent. B renal artery stent veins appear patent. There is no adenopathy. Bilateral iliac chain surgical staples noted. Reproductive: Hysterectomy.  No pelvic mass. Other: None Musculoskeletal: Degenerative changes of the spine and hips. No acute osseous pathology. IMPRESSION: 1. Cryoablation with infarcted appearance of the left renal mass. The remainder of the left renal parenchyma demonstrate normal enhancement and excretion. There has been interval decrease in the size of the left perinephric hematoma compared to the CT of 03/29/2017. No evidence of active bleed. 2. Partially visualized small bilateral pleural effusions as seen on the prior CT. 3.  Aortic Atherosclerosis (ICD10-I70.0). Electronically Signed   By: Anner Crete M.D.   On: 04/04/2017 18:42    EKG: Independently reviewed.  Assessment/Plan Principal Problem:   HCAP (healthcare-associated pneumonia) Active Problems:   Essential hypertension   Ischemic cardiomyopathy  EF 30% cath 4/14 with stent   Chronic combined systolic and diastolic congestive heart failure (HCC)   Paroxysmal atrial fibrillation ([name] [name])   Pacemaker -CRT-St. Jude   Left renal mass   Acute blood loss as cause of postoperative anemia    1. Post op fever - Treating as HCAP for now 1. Zosyn / vanc 2. PNA pathway 3. Sputum Cx, BCx, UCx 4. Also on DDx for post op fever - 1. Atelectasis - IS ordered 2. UTI - UCx ordered, on ABx as above 3. Infection of hematoma - 1. Hematoma smaller on CT 2. No Gas on CT 3. Getting UCx and BCx 4. ABx as above 5. Seems less likely given lack of sepsis for the moment 6. May wish to touch base with urology in  AM 4. DVT / PE - 1. Plan to resume eliquis that was going to be restarted around today anyhow. 2. No tachycardia or leg swelling other than lateral malleolus of R ankle 5. Possible new gout with R malleolus swelling? 1. Checking uric acid level 5. Repeat CBC, BMP in AM 2. HTN - continue lisinopril for now 3. PAF - resume eliquis 4. ICM - 1. Continue lisinopril 2. Doubt CHF exacerbation at root of presentation given fever 5. L renal  mass - s/p ablation as above 6. Post op acute blood loss anemia - 1. Improving slowly 2. Hematoma appears smaller on today's CT 3. No evidence of active bleed on today's CT read.  DVT prophylaxis: Eliquis Code Status: Full code for now Family Communication: Family at bedside, of note family member is a Software engineer here at [employer].  Daughter is an NP. Disposition Plan: Home after admit Consults called: None Admission status: Admit to inpatient - inpatient status as patient unable to ambulate at this time.  Mild increased confusion.   Etta Quill DO [facility] Pager [phone]  If 7AM-7PM, please contact day team taking care of patient [email or web address] Password [facility]  04/04/2017, 8:27 PM

## 2017-04-04 NOTE — ED Provider Notes (Signed)
[place] DEPT Provider Note   CSN: [id number] Arrival date & time: 04/04/17  1240     History   Chief Complaint No chief complaint on file.   HPI [name] [name] is a 83 y.o. female.  HPI   83 year old female with extensive past medical history as below including recent cryotherapy of right renal mass complicated by hematoma formation and acute blood loss anemia here with generalized weakness.  The patient was discharged last Thursday.  Since then, she has had progressive worsening failure to thrive.  She is been extremely weak and is now essentially unable to get around her house.  Family is trying to help her out but she is increasingly weak as well as increasingly confused.  She has had little to no appetite.  She is also complained of worsening abdominal pain and distention.  Over the last 24 hours, she is also developed pain, redness, and swelling of her right ankle.  This is new.  Family does not recall any specific trauma.  Patient states she feels tired and weak and "rundown."  Denies any known fevers.  Denies any cough.  Her son and daughter-in-law are both Chemical engineer.  Past Medical History:  Diagnosis Date  . Atrial fibrillation ([place]) 10/06/2007   post op  . COPD (chronic obstructive pulmonary disease) ([place])    Emphysema on 02/03/13 CXR  . CORONARY ARTERY DISEASE 10/06/2007   a. s/p PCI to LAD; b. s/p CABG; c. LHC 07/13/12: Proximal LAD stent patent, LIMA-LAD atretic, D1 occluded, proximal circumflex 30%, mid RCA occluded, SVG-D1 normal, SVG-OM2 normal, SVG-distal RCA normal, EF 40% with diffuse HK  . Dizziness   . History of colonic polyps 10/30/2009   No polyps in 2011. No repeat.    Marland Kitchen HYPERLIPIDEMIA 10/06/2007  . HYPERTENSION 10/06/2007  . LBBB 09/06/2008  . Left renal mass   . MELANOMA 09/06/2008   MOES PROCEDURE RIGHT  . MYOCARDIAL INFARCTION, HX OF 10/06/2007  . NICM (nonischemic cardiomyopathy) ([place])    Echocardiogram 07/12/12: EF  25-30%, diffuse HK, mild AI, mild MR, mild LAE  . PERSONAL HX COLONIC POLYPS 10/30/2009  . Presence of permanent cardiac pacemaker   . Syncope   . UTERINE CANCER, HX OF 09/06/2008  . VARICOSE VEIN 09/29/2009    Patient Active Problem List   Diagnosis Date Noted  . Traumatic perinephric hematoma of left kidney 03/29/2017  . Acute blood loss as cause of postoperative anemia 03/29/2017  . Left renal mass 01/27/2017  . Overactive bladder 07/09/2016  . Dizziness 05/07/2016  . Microscopic hematuria 09/02/2015  . CAD (coronary artery disease) 04/17/2014  . Allergic rhinitis 04/17/2014  . Mild cognitive impairment 04/17/2014  . Hearing loss 04/17/2014  . Pacemaker -CRT-St. Jude 05/21/2013  . Atrioventricular block, complete ([place]) 03/08/2013  . Paroxysmal atrial fibrillation ([place]) 02/03/2013  . Asthma, intrinsic 02/03/2013  . Chronic combined systolic and diastolic congestive heart failure ([place]) 10/10/2012  . Depression 09/19/2012  . Varicose veins 09/29/2009  . History of melanoma 09/06/2008  . LBBB (left bundle branch block) 09/06/2008  . History of uterine cancer 09/06/2008  . Hyperlipidemia 10/06/2007  . Essential hypertension 10/06/2007  . Ischemic cardiomyopathy  EF 30% cath 4/14 with stent 10/06/2007    Past Surgical History:  Procedure Laterality Date  . ABDOMINAL HYSTERECTOMY  1988  . BI-VENTRICULAR PACEMAKER INSERTION (CRT-P)  02/02/2013   St. Jude, serial no. #[id number]   . CARDIAC CATHETERIZATION  08/30/2008  . CORONARY ANGIOPLASTY WITH STENT PLACEMENT  2009  .  CORONARY ARTERY BYPASS GRAFT  2004  . IR RADIOLOGIST EVAL & MGMT  02/08/2017  . LEFT HEART CATHETERIZATION WITH CORONARY ANGIOGRAM Bilateral 07/13/2012   Procedure: LEFT HEART CATHETERIZATION WITH CORONARY ANGIOGRAM;  Surgeon: Peter M Martinique, MD;  Location: [facility] CATH LAB;  Service: Cardiovascular;  Laterality: Bilateral;  . OOPHORECTOMY    . PERMANENT PACEMAKER INSERTION N/A 02/02/2013   Procedure: PERMANENT  PACEMAKER INSERTION;  Surgeon: Evans Lance, MD;  Location: [facility] CATH LAB;  Service: Cardiovascular;  Laterality: N/A;  . RADIOLOGY WITH ANESTHESIA Left 03/28/2017   Procedure: RENAL CRYO ABLATION;  Surgeon: Aletta Edouard, MD;  Location: [facility] ORS;  Service: Radiology;  Laterality: Left;  . right distal pretibeal     melanoma    OB History    No data available       Home Medications    Prior to Admission medications   Medication Sig Start Date End Date Taking? Authorizing Provider  escitalopram (LEXAPRO) 20 MG tablet TAKE 1 TABLET(20 MG) BY MOUTH DAILY Patient taking differently: TAKE 1 TABLET(20 MG) BY MOUTH at bedtime 03/07/17  Yes Marin Olp, MD  HYDROcodone-acetaminophen (NORCO/VICODIN) 5-325 MG tablet Take 1-2 tablets by mouth every 6 (six) hours as needed for moderate pain. 03/31/17  Yes Georgette Shell, MD  lisinopril (PRINIVIL,ZESTRIL) 2.5 MG tablet TAKE 1 TABLET(2.5 MG) BY MOUTH DAILY Patient taking differently: TAKE 1 TABLET(2.5 MG) BY MOUTH at bedtime 05/13/16  Yes Deboraha Sprang, MD  mirabegron ER (MYRBETRIQ) 25 MG TB24 tablet Take 1 tablet (25 mg total) by mouth daily. 11/04/16  Yes Marin Olp, MD  pyridostigmine (MESTINON) 60 MG tablet TAKE 1 TABLET(60 MG) BY MOUTH TWICE DAILY 10/27/16  Yes Marin Olp, MD  ELIQUIS 5 MG TABS tablet Take 5 mg by mouth daily. 03/06/17   [provider]  ondansetron (ZOFRAN-ODT) 4 MG disintegrating tablet Take 1 tablet (4 mg total) by mouth every 8 (eight) hours as needed for nausea or vomiting. Patient not taking: Reported on 04/04/2017 03/31/17   Georgette Shell, MD  polyethylene glycol [facility] / Floria Raveling) packet Take 17 g by mouth daily. Patient not taking: Reported on 04/04/2017 04/01/17   Georgette Shell, MD  senna-docusate (SENOKOT-S) 8.6-50 MG tablet Take 1 tablet by mouth daily as needed for mild constipation. Patient not taking: Reported on 04/04/2017 03/31/17   Georgette Shell, MD     Family History Family History  Problem Relation Age of Onset  . Stroke Mother   . Heart attack Father   . Colon cancer Neg Hx     Social History Social History   Tobacco Use  . Smoking status: Never Smoker  . Smokeless tobacco: Never Used  Substance Use Topics  . Alcohol use: Yes    Comment:  OCC gin or wine  . Drug use: No     Allergies   Aricept [donepezil] and Nitroglycerin   Review of Systems Review of Systems  Constitutional: Positive for activity change, appetite change and fatigue.  Gastrointestinal: Positive for abdominal distention and abdominal pain.  Musculoskeletal: Positive for gait problem.  Neurological: Positive for weakness.  Hematological: Bruises/bleeds easily.  All other systems reviewed and are negative.    Physical Exam Updated Vital Signs BP 136/62 (BP Location: Left Arm)   Pulse 82   Temp (!) 101.2 F (38.4 C) (Rectal)   Resp 19   Ht 5\' 8"  (1.727 m)   Wt 61.7 kg (136 lb)   SpO2 94%   BMI 20.68  kg/m   Physical Exam  Constitutional: She is oriented to person, place, and time. She appears well-developed and well-nourished. No distress.  HENT:  Head: Normocephalic and atraumatic.  Mildly dry mucous membranes  Eyes: Conjunctivae are normal.  Neck: Neck supple.  Cardiovascular: Normal rate, regular rhythm and normal heart sounds. Exam reveals no friction rub.  No murmur heard. Pulmonary/Chest: Effort normal and breath sounds normal. No respiratory distress. She has no wheezes. She has no rales.  Abdominal: She exhibits no distension.  Mild distention, diffuse tenderness.  Musculoskeletal: She exhibits no edema.  Significant tenderness over right lateral ankle and midfoot.  No bruising.  Moderate erythema and warmth to touch.  Neurological: She is alert and oriented to person, place, and time. She exhibits normal muscle tone.  Skin: Skin is warm. Capillary refill takes less than 2 seconds.  Psychiatric: She has a normal mood and  affect.  Nursing note and vitals reviewed.    ED Treatments / Results  Labs (all labs ordered are listed, but only abnormal results are displayed) Labs Reviewed  BASIC METABOLIC PANEL - Abnormal; Notable for the following components:      Result Value   Sodium 134 (*)    Potassium 3.4 (*)    Chloride 98 (*)    Glucose, Bld 113 (*)    Calcium 8.2 (*)    All other components within normal limits  CBC - Abnormal; Notable for the following components:   RBC 3.04 (*)    Hemoglobin 8.8 (*)    HCT 27.1 (*)    All other components within normal limits  URINALYSIS, ROUTINE W REFLEX MICROSCOPIC - Abnormal; Notable for the following components:   Color, Urine AMBER (*)    Hgb urine dipstick LARGE (*)    Ketones, ur 5 (*)    Protein, ur 100 (*)    Leukocytes, UA TRACE (*)    Bacteria, UA RARE (*)    Squamous Epithelial / LPF 0-5 (*)    All other components within normal limits  CBG MONITORING, ED - Abnormal; Notable for the following components:   Glucose-Capillary 111 (*)    All other components within normal limits  I-STAT CG4 LACTIC ACID, ED  I-STAT TROPONIN, ED  I-STAT CG4 LACTIC ACID, ED    EKG  EKG Interpretation None       Radiology Dg Chest 2 View  Result Date: 04/04/2017 CLINICAL DATA:  Recent renal cryoablation with perinephric hematoma. Possible atelectasis. EXAM: CHEST  2 VIEW COMPARISON:  03/22/2017 FINDINGS: Sternotomy wires and left-sided pacemaker unchanged. Lungs are adequately inflated with mild left basilar opacification likely atelectasis and possible small amount of pleural fluid. Mild cardiomegaly. Remainder of the exam is unchanged. IMPRESSION: Minimal left base opacification likely atelectasis with small amount of pleural fluid. Electronically Signed   By: Marin Olp M.D.   On: 04/04/2017 17:25   Dg Ankle Complete Right  Result Date: 04/04/2017 CLINICAL DATA:  Pain and swelling in the right ankle for 1 day with bruising along the medial malleolus.  EXAM: RIGHT ANKLE - COMPLETE 3+ VIEW COMPARISON:  None. FINDINGS: Abnormal soft tissue swelling overlying both the medial and lateral malleolus. Bony demineralization. Plafond and talar dome appear intact. No well-defined fracture. Plantar calcaneal spur with mild calcification in the plantar fascia and distal Achilles tendon. Atherosclerotic vascular calcifications noted. IMPRESSION: 1. Abnormal soft tissue swelling overlying both malleoli and along the heel, cause uncertain. 2. Atherosclerosis. 3. Plantar calcaneal spur.  Faint chondrocalcinosis. 4. Bony demineralization. Electronically  Signed   By: Van Clines M.D.   On: 04/04/2017 14:39   Ct Abdomen Pelvis W Contrast  Result Date: 04/04/2017 CLINICAL DATA:  83 year old female status post recent biopsy and cryoablation of the left renal mass complicated by hemorrhage. Continued flank pain. EXAM: CT ABDOMEN AND PELVIS WITH CONTRAST TECHNIQUE: Multidetector CT imaging of the abdomen and pelvis was performed using the standard protocol following bolus administration of intravenous contrast. CONTRAST:  119mL ISOVUE-300 IOPAMIDOL (ISOVUE-300) INJECTION 61% COMPARISON:  Multiple prior abdominal CTs dating back to 12/30/2016 FINDINGS: Lower chest: Partially visualized small bilateral pleural effusions with associated subsegmental atelectatic changes of the left lung base similar to the prior CT of 03/29/2017. There is mild cardiomegaly. Cardiac pacemaker wires and CABG clips noted. No intraperitoneal free air or free fluid. Hepatobiliary: The liver is unremarkable. No intrahepatic biliary ductal dilatation. The gallbladder is partially contracted and appears unremarkable. Pancreas: Unremarkable. No pancreatic ductal dilatation or surrounding inflammatory changes. Spleen: Normal in size without focal abnormality. Adrenals/Urinary Tract: The adrenal glands are unremarkable. Status post cryoablation of left renal mid to lower pole mass with associated  infarction. There is enhancement of the remainder of the left renal parenchyma. Smaller hypodense upper pole lesions are not well characterized but likely represent cysts. There is overall interval decrease in the size of the left perinephric hematoma compared to the study of 03/29/2017. No extravasated contrast noted to suggest active bleed. Small pockets of air again noted along the posterolateral aspect of the left kidney in the perirenal space as seen on the prior CT. There is decreased or interval resolution of the blood seen in the posterior pelvis on the prior CT. The right kidney is unremarkable. There is no hydronephrosis on either side. There is symmetric enhancement and excretion of contrast by both kidneys. The visualized ureters and urinary bladder appear unremarkable. Stomach/Bowel: There is no bowel obstruction or active inflammation. The appendix appears unremarkable as visualized. Vascular/Lymphatic: There is moderate aortoiliac atherosclerotic disease. The origins of the celiac axis, SMA, IMA are patent. The SMV, splenic vein, and main portal vein are patent. B renal artery stent veins appear patent. There is no adenopathy. Bilateral iliac chain surgical staples noted. Reproductive: Hysterectomy.  No pelvic mass. Other: None Musculoskeletal: Degenerative changes of the spine and hips. No acute osseous pathology. IMPRESSION: 1. Cryoablation with infarcted appearance of the left renal mass. The remainder of the left renal parenchyma demonstrate normal enhancement and excretion. There has been interval decrease in the size of the left perinephric hematoma compared to the CT of 03/29/2017. No evidence of active bleed. 2. Partially visualized small bilateral pleural effusions as seen on the prior CT. 3.  Aortic Atherosclerosis (ICD10-I70.0). Electronically Signed   By: Anner Crete M.D.   On: 04/04/2017 18:42    Procedures Procedures (including critical care time)  Medications Ordered in  ED Medications  iopamidol (ISOVUE-300) 61 % injection (not administered)  acetaminophen (TYLENOL) tablet 650 mg (not administered)  0.9 %  sodium chloride infusion ( Intravenous New Bag/Given 04/04/17 1829)  iopamidol (ISOVUE-300) 61 % injection 100 mL (100 mLs Intravenous Contrast Given 04/04/17 1757)     Initial Impression / Assessment and Plan / ED Course  I have reviewed the triage vital signs and the nursing notes.  Pertinent labs & imaging results that were available during my care of the patient were reviewed by me and considered in my medical decision making (see chart for details).     83 year old female here with generalized failure  to thrive after recent surgery.  It is difficult to tell whether this is secondary to deconditioning in the setting of surgery versus her symptomatic anemia, versus possible occult UTI and atelectasis contributing to her weakness.  She is having extreme difficulty getting around the house, is no longer eating and drinking, and will need admission.  Reviewed as above.  Hemoglobin seems stable.  CT shows no expansion of the perinephric hematoma.  Urinalysis does show mild pyuria.  This is somewhat difficult to interpret in the setting of her recent procedure.  Chest x-ray shows effusion and infiltrate with atelectasis.  She is now febrile to 101.2.  Concern for failure to thrive secondary to sepsis of unclear source.  Differential includes possible pneumonia, UTI, also possibly cellulitis.  Will treat with broad-spectrum antibiotics and plan for admission.  Final Clinical Impressions(s) / ED Diagnoses   Final diagnoses:  Failure to thrive in adult  Cellulitis of right lower extremity    ED Discharge Orders    None       Duffy Bruce, MD 04/04/17 1900

## 2017-04-04 NOTE — Patient Instructions (Signed)
We will check blood work.  Please go to [facility] ED.

## 2017-04-04 NOTE — ED Notes (Signed)
ED Provider at bedside. 

## 2017-04-04 NOTE — Progress Notes (Signed)
Pharmacy Antibiotic Note  [name] [name] is a 83 y.o. female admitted on 04/04/2017 with pneumonia vs other source.  Pharmacy has been consulted for vancomycin and Zosyn dosing.  Plan:  Vancomycin 1000 mg IV now, then 750 mg IV q24 hr (est AUC 477 based on SCr 1.0)  Measure vancomycin AUC at steady state as indicated  Zosyn 3.375 g IV given once over 30 minutes, then every 8 hrs by 4-hr infusion  Daily SCr while on Vanc/zosyn combo   Height: 5\' 8"  (172.7 cm) Weight: 136 lb (61.7 kg) IBW/kg (Calculated) : 63.9  Temp (24hrs), Avg:99.5 F (37.5 C), Min:98.1 F (36.7 C), Max:101.2 F (38.4 C)  Recent Labs  Lab 03/29/17 0337 03/29/17 1420 03/30/17 0150 03/31/17 0645 04/04/17 1420 04/04/17 1839  WBC 12.7* 9.9 8.7 8.0 10.1  --   CREATININE 1.28*  --  0.98 0.71 0.68  --   LATICACIDVEN  --   --   --   --   --  0.88    Estimated Creatinine Clearance: 51.9 mL/min (by C-G formula based on SCr of 0.68 mg/dL).    Allergies  Allergen Reactions  . Aricept [Donepezil] Shortness Of Breath and Other (See Comments)    Hallucinations and loss of sensation in legs  . Nitroglycerin Other (See Comments)    Oral spray form causes rapid drop in blood pressure.        Thank you for allowing pharmacy to be a part of this patient's care.  Reuel Boom, PharmD, BCPS Pager: [phone] 04/04/2017, 8:30 PM

## 2017-04-04 NOTE — Progress Notes (Signed)
Subjective:  [name] [name] is a 83 y.o. female who presents today with a chief complaint of hospital follow-up, generalized weakness, and right foot pain.   HPI:  Summary of recent hospitalization: Patient was admitted on 03/28/2017 for cryoablation of an incidentally found left 3.2 cm renal mass.  She underwent the renal cryoablation however afterwards developed a perinephric hematoma.  She had a repeat CT scan after the procedure which showed an increasing hematoma.  Also noted to have an acute drop in her hemoglobin down to 8.6 the day after the procedure from a baseline of 12-13.  Patient was observed for 2 days and discharged on 03/31/2017.  At the time of her discharge, her hemoglobin had stabilized.  Should also had normalization of her creatinine.  Her Eliquis was held at the time of discharge due to her hematoma.  TCM phone call was placed on 04/01/2017.  Generalized weakness, new issue Patient is here with her family today.  Reports that since she has been discharged she has become generally weak with low energy.  No focal weakness.  She feels very tired.  Abdominal pain, new issue Family and patient also reports that she has had increased abdominal pain and bloating over the last couple of days since being discharged from the hospital.  Abdominal pain is generalized however more noted in the lower abdomen.  No constipation or diarrhea.  No nausea or vomiting.  She has been passing gas.  Normal bowel movements.  Right foot pain, new issue Started this morning.  Located to the top of her right foot.  No traumas, falls, or other obvious precipitating events.  No treatments tried.  Pain is worse with movement and weightbearing.  No alleviating factors noted.  ROS: Per HPI, otherwise a 14 point review of systems was performed and was negative  PMH:  The following were reviewed and entered/updated in epic: Past Medical History:  Diagnosis Date  . Atrial fibrillation ([place])  10/06/2007   post op  . COPD (chronic obstructive pulmonary disease) ([place])    Emphysema on 02/03/13 CXR  . CORONARY ARTERY DISEASE 10/06/2007   a. s/p PCI to LAD; b. s/p CABG; c. LHC 07/13/12: Proximal LAD stent patent, LIMA-LAD atretic, D1 occluded, proximal circumflex 30%, mid RCA occluded, SVG-D1 normal, SVG-OM2 normal, SVG-distal RCA normal, EF 40% with diffuse HK  . Dizziness   . History of colonic polyps 10/30/2009   No polyps in 2011. No repeat.    Marland Kitchen HYPERLIPIDEMIA 10/06/2007  . HYPERTENSION 10/06/2007  . LBBB 09/06/2008  . Left renal mass   . MELANOMA 09/06/2008   MOES PROCEDURE RIGHT  . MYOCARDIAL INFARCTION, HX OF 10/06/2007  . NICM (nonischemic cardiomyopathy) ([place])    Echocardiogram 07/12/12: EF 25-30%, diffuse HK, mild AI, mild MR, mild LAE  . PERSONAL HX COLONIC POLYPS 10/30/2009  . Presence of permanent cardiac pacemaker   . Syncope   . UTERINE CANCER, HX OF 09/06/2008  . VARICOSE VEIN 09/29/2009   Patient Active Problem List   Diagnosis Date Noted  . Traumatic perinephric hematoma of left kidney 03/29/2017  . Acute blood loss as cause of postoperative anemia 03/29/2017  . Left renal mass 01/27/2017  . Overactive bladder 07/09/2016  . Dizziness 05/07/2016  . Microscopic hematuria 09/02/2015  . CAD (coronary artery disease) 04/17/2014  . Allergic rhinitis 04/17/2014  . Mild cognitive impairment 04/17/2014  . Hearing loss 04/17/2014  . Pacemaker -CRT-St. Jude 05/21/2013  . Atrioventricular block, complete ([place]) 03/08/2013  . Paroxysmal atrial  fibrillation ([place]) 02/03/2013  . Asthma, intrinsic 02/03/2013  . Chronic combined systolic and diastolic congestive heart failure ([place]) 10/10/2012  . Depression 09/19/2012  . Varicose veins 09/29/2009  . History of melanoma 09/06/2008  . LBBB (left bundle branch block) 09/06/2008  . History of uterine cancer 09/06/2008  . Hyperlipidemia 10/06/2007  . Essential hypertension 10/06/2007  . Ischemic cardiomyopathy  EF 30% cath 4/14  with stent 10/06/2007   Past Surgical History:  Procedure Laterality Date  . ABDOMINAL HYSTERECTOMY  1988  . BI-VENTRICULAR PACEMAKER INSERTION (CRT-P)  02/02/2013   St. Jude, serial no. #[id number]   . CARDIAC CATHETERIZATION  08/30/2008  . CORONARY ANGIOPLASTY WITH STENT PLACEMENT  2009  . CORONARY ARTERY BYPASS GRAFT  2004  . IR RADIOLOGIST EVAL & MGMT  02/08/2017  . LEFT HEART CATHETERIZATION WITH CORONARY ANGIOGRAM Bilateral 07/13/2012   Procedure: LEFT HEART CATHETERIZATION WITH CORONARY ANGIOGRAM;  Surgeon: Peter M Martinique, MD;  Location: [name] CATH LAB;  Service: Cardiovascular;  Laterality: Bilateral;  . OOPHORECTOMY    . PERMANENT PACEMAKER INSERTION N/A 02/02/2013   Procedure: PERMANENT PACEMAKER INSERTION;  Surgeon: Evans Lance, MD;  Location: [facility] CATH LAB;  Service: Cardiovascular;  Laterality: N/A;  . RADIOLOGY WITH ANESTHESIA Left 03/28/2017   Procedure: RENAL CRYO ABLATION;  Surgeon: Aletta Edouard, MD;  Location: [facility] ORS;  Service: Radiology;  Laterality: Left;  . right distal pretibeal     melanoma    Family History  Problem Relation Age of Onset  . Stroke Mother   . Heart attack Father   . Colon cancer Neg Hx     Medications- reviewed and updated Current Outpatient Medications  Medication Sig Dispense Refill  . escitalopram (LEXAPRO) 20 MG tablet TAKE 1 TABLET(20 MG) BY MOUTH DAILY (Patient taking differently: TAKE 1 TABLET(20 MG) BY MOUTH at bedtime) 30 tablet 5  . HYDROcodone-acetaminophen (NORCO/VICODIN) 5-325 MG tablet Take 1-2 tablets by mouth every 6 (six) hours as needed for moderate pain. 30 tablet 0  . lisinopril (PRINIVIL,ZESTRIL) 2.5 MG tablet TAKE 1 TABLET(2.5 MG) BY MOUTH DAILY (Patient taking differently: TAKE 1 TABLET(2.5 MG) BY MOUTH at bedtime) 30 tablet 9  . mirabegron ER (MYRBETRIQ) 25 MG TB24 tablet Take 1 tablet (25 mg total) by mouth daily. 30 tablet 5  . ondansetron (ZOFRAN-ODT) 4 MG disintegrating tablet Take 1 tablet (4 mg total) by mouth every  8 (eight) hours as needed for nausea or vomiting. 20 tablet 0  . polyethylene glycol (MIRALAX / GLYCOLAX) packet Take 17 g by mouth daily. 14 each 0  . pyridostigmine (MESTINON) 60 MG tablet TAKE 1 TABLET(60 MG) BY MOUTH TWICE DAILY 60 tablet 5  . senna-docusate (SENOKOT-S) 8.6-50 MG tablet Take 1 tablet by mouth daily as needed for mild constipation.     No current facility-administered medications for this visit.     Allergies-reviewed and updated Allergies  Allergen Reactions  . Aricept [Donepezil] Shortness Of Breath and Other (See Comments)    Hallucinations and loss of sensation in legs  . Nitroglycerin Other (See Comments)    Oral spray form causes rapid drop in blood pressure.      Social History   Socioeconomic History  . Marital status: Widowed    Spouse name: None  . Number of children: None  . Years of education: None  . Highest education level: None  Social Needs  . Financial resource strain: None  . Food insecurity - worry: None  . Food insecurity - inability: None  . Transportation needs -  medical: None  . Transportation needs - non-medical: None  Occupational History  . None  Tobacco Use  . Smoking status: Never Smoker  . Smokeless tobacco: Never Used  Substance and Sexual Activity  . Alcohol use: Yes    Comment:  OCC gin or wine  . Drug use: No  . Sexual activity: None  Other Topics Concern  . None  Social History Narrative   Widowed. Lives alone with new cat as of 2018. 3 children. 6 grandkids   Daughter  (NP) and son in Sports coach (pharmacist-teaches at [employer]) that live close and help out.       Drives in the daytime, shops for herself, cleaning-has someone over to clean due to the intermittent lightheadedness, bathing      Retired from physical education.         Objective:  Physical Exam: BP 122/72 (BP Location: Left Arm, Patient Position: Sitting, Cuff Size: Normal)   Pulse 79   Temp 99.2 F (37.3 C) (Oral)   Ht 5\' 8"  (1.727 m)   Wt 136  lb (61.7 kg)   SpO2 94%   BMI 20.68 kg/m   Gen: NAD, resting comfortably, sitting in chair CV: RRR with no murmurs appreciated Pulm: NWOB, CTAB with no crackles, wheezes, or rhonchi GI: Normal bowel sounds present. Soft, mildly distended.  Generalized tenderness, most notable in lower abdomen.  Murphy sign negative.  No rebound or guarding. MSK:  -Right foot: Dorsal aspect erythematous without gross deformity.  Tender to palpation along dorsal midfoot.  Neurovascularly intact distally.  Trace to 1+ pitting edema to mid tibia. Skin: Warm, dry Neuro: Grossly normal, moves all extremities Psych: Normal affect and thought content  Summary/review of workup from recent hospitalization: CBC 03/31/2017: WBC 8.0, hemoglobin 8.4, hematocrit 25.5, platelets 78 BMET 03/31/2017: Sodium 139, potassium 3.7, chloride 108, bicarb 27, glucose 106, BUN 12, creatinine 0.71, calcium 8.0  CT abdomen pelvis 03/29/2017: Progression of left renal subscapular and perinephric hematoma with small amount of free pelvic fluid  Assessment/Plan:  Generalized weakness Abdominal pain Perinephric hematoma Given that her weakness and abdominal pain have worsened over the last few days, there is concerned that her hematoma has progressed.  Her abdominal exam is largely benign-she does have some generalized distention, however no other peritoneal signs.  Discussed treatment options with patient and her family.  They have elected to go to the emergency room for further evaluation at this time.  We will check CBC and BMET today to to characterize other possible sources of her generalized weakness, however think that she needs to be ruled out for anemia first.  Her hemoglobin was 8.6 at the time of her discharge 4 days ago.  She will likely need renal imaging to evaluate progression of her hematoma.  Right foot pain Given her recent procedure and hospitalization she is at an elevated risk for DVT.  Given her hematoma, d-dimer has  no utility.  She will need a lower extremity Doppler to rule out DVT.  We will not be able to start anticoagulation until she has her anemia and recent perinephric hematoma further evaluated.  Given this, in addition to the above problems, it was decided to send the patient to the emergency room for further evaluation.  Patient has a high level of complexity of medical decision making due to her number of treatment options as well as complexity/amount of data reviewed.  Algis Greenhouse. [name], MD 04/04/2017 11:42 AM

## 2017-04-04 NOTE — Progress Notes (Signed)
A consult was received from an ED physician for Vancomycin and Zosyn per pharmacy dosing.  The patient's profile has been reviewed for ht/wt/allergies/indication/available labs. A one time order has been placed for the above antibiotics.  Further antibiotics/pharmacy consults should be ordered by admitting physician if indicated.                       Thank you, Reuel Boom, PharmD, BCPS Pager: [phone] 04/04/2017, 7:02 PM

## 2017-04-04 NOTE — Progress Notes (Signed)
Right lower extremity venous duplex has been completed. Negative for DVT. Results were given to Dr. Ralene Bathe.   04/04/17 5:32 PM Carlos Levering RVT

## 2017-04-04 NOTE — ED Notes (Signed)
Bed: WA33 Expected date:  Expected time:  Means of arrival:  Comments: 

## 2017-04-04 NOTE — ED Notes (Signed)
ED TO INPATIENT HANDOFF REPORT  Name/Age/Gender [name] [name] 83 y.o. female  Code Status    Code Status Orders  (From admission, onward)        Start     Ordered   04/04/17 1939  Full code  Continuous     04/04/17 1940    Code Status History    Date Active Date Inactive Code Status Order ID Comments User Context   03/29/2017 21:30 03/31/2017 15:07 Full Code [id number]  Janece Canterbury, MD Inpatient   02/02/2013 14:55 02/03/2013 13:44 Full Code [id number]  Evans Lance, MD Inpatient   07/11/2012 14:55 07/14/2012 20:21 Full Code [id number]  Elvera Lennox, MD Inpatient      Home/SNF/Other Home  Chief Complaint swollen right ankle/weakness post surgical   Level of Care/Admitting Diagnosis ED Disposition    ED Disposition Condition [place] [facility] Area: [facility] [[id number]]  Level of Care: Med-Surg [16]  Diagnosis: HCAP (healthcare-associated pneumonia) [130865]  Admitting Physician: Doreatha Massed  Attending Physician: Etta Quill [phone]  Estimated length of stay: past midnight tomorrow  Certification:: I certify this patient will need inpatient services for at least 2 midnights  PT Class (Do Not Modify): Inpatient [101]  PT Acc Code (Do Not Modify): Private [1]       Medical History Past Medical History:  Diagnosis Date  . Atrial fibrillation ([place]) 10/06/2007   post op  . COPD (chronic obstructive pulmonary disease) ([place])    Emphysema on 02/03/13 CXR  . CORONARY ARTERY DISEASE 10/06/2007   a. s/p PCI to LAD; b. s/p CABG; c. LHC 07/13/12: Proximal LAD stent patent, LIMA-LAD atretic, D1 occluded, proximal circumflex 30%, mid RCA occluded, SVG-D1 normal, SVG-OM2 normal, SVG-distal RCA normal, EF 40% with diffuse HK  . Dizziness   . History of colonic polyps 10/30/2009   No polyps in 2011. No repeat.    Marland Kitchen HYPERLIPIDEMIA 10/06/2007  . HYPERTENSION 10/06/2007  . LBBB 09/06/2008  . Left renal mass   . MELANOMA 09/06/2008   MOES  PROCEDURE RIGHT  . MYOCARDIAL INFARCTION, HX OF 10/06/2007  . NICM (nonischemic cardiomyopathy) ([place])    Echocardiogram 07/12/12: EF 25-30%, diffuse HK, mild AI, mild MR, mild LAE  . PERSONAL HX COLONIC POLYPS 10/30/2009  . Presence of permanent cardiac pacemaker   . Syncope   . UTERINE CANCER, HX OF 09/06/2008  . VARICOSE VEIN 09/29/2009    Allergies Allergies  Allergen Reactions  . Aricept [Donepezil] Shortness Of Breath and Other (See Comments)    Hallucinations and loss of sensation in legs  . Nitroglycerin Other (See Comments)    Oral spray form causes rapid drop in blood pressure.      IV Location/Drains/Wounds Patient Lines/Drains/Airways Status   Active Line/Drains/Airways    Name:   Placement date:   Placement time:   Site:   Days:   Peripheral IV 04/04/17 Right;Lateral Forearm   04/04/17    1757    Forearm   less than 1   Incision - 1 Port Other (Comment) Left;Upper   03/28/17    -     7   Wound / Incision (Open or Dehisced) 09/12/15 Laceration Head Right   09/12/15    0812    Head   570          Labs/Imaging Results for orders placed or performed during the hospital encounter of 04/04/17 (from the past 48 hour(s))  Basic metabolic panel  Status: Abnormal   Collection Time: 04/04/17  2:20 PM  Result Value Ref Range   Sodium 134 (L) 135 - 145 mmol/L   Potassium 3.4 (L) 3.5 - 5.1 mmol/L   Chloride 98 (L) 101 - 111 mmol/L   CO2 29 22 - 32 mmol/L   Glucose, Bld 113 (H) 65 - 99 mg/dL   BUN 14 6 - 20 mg/dL   Creatinine, Ser 0.68 0.44 - 1.00 mg/dL   Calcium 8.2 (L) 8.9 - 10.3 mg/dL   GFR calc non Af Amer >60 >60 mL/min   GFR calc Af Amer >60 >60 mL/min    Comment: (NOTE) The eGFR has been calculated using the CKD EPI equation. This calculation has not been validated in all clinical situations. eGFR's persistently <60 mL/min signify possible Chronic Kidney Disease.    Anion gap 7 5 - 15  CBC     Status: Abnormal   Collection Time: 04/04/17  2:20 PM  Result  Value Ref Range   WBC 10.1 4.0 - 10.5 K/uL   RBC 3.04 (L) 3.87 - 5.11 MIL/uL   Hemoglobin 8.8 (L) 12.0 - 15.0 g/dL   HCT 27.1 (L) 36.0 - 46.0 %   MCV 89.1 78.0 - 100.0 fL   MCH 28.9 26.0 - 34.0 pg   MCHC 32.5 30.0 - 36.0 g/dL   RDW 13.5 11.5 - 15.5 %   Platelets 189 150 - 400 K/uL  CBG monitoring, ED     Status: Abnormal   Collection Time: 04/04/17  4:26 PM  Result Value Ref Range   Glucose-Capillary 111 (H) 65 - 99 mg/dL  Urinalysis, Routine w reflex microscopic     Status: Abnormal   Collection Time: 04/04/17  5:13 PM  Result Value Ref Range   Color, Urine AMBER (A) YELLOW    Comment: BIOCHEMICALS MAY BE AFFECTED BY COLOR   APPearance CLEAR CLEAR   Specific Gravity, Urine 1.021 1.005 - 1.030   pH 6.0 5.0 - 8.0   Glucose, UA NEGATIVE NEGATIVE mg/dL   Hgb urine dipstick LARGE (A) NEGATIVE   Bilirubin Urine NEGATIVE NEGATIVE   Ketones, ur 5 (A) NEGATIVE mg/dL   Protein, ur 100 (A) NEGATIVE mg/dL   Nitrite NEGATIVE NEGATIVE   Leukocytes, UA TRACE (A) NEGATIVE   RBC / HPF 6-30 0 - 5 RBC/hpf   WBC, UA 6-30 0 - 5 WBC/hpf   Bacteria, UA RARE (A) NONE [name]   Squamous Epithelial / LPF 0-5 (A) NONE [name]   Mucus PRESENT   I-Stat Troponin, ED (not at [facility])     Status: None   Collection Time: 04/04/17  6:37 PM  Result Value Ref Range   Troponin i, poc 0.02 0.00 - 0.08 ng/mL   Comment 3            Comment: Due to the release kinetics of cTnI, a negative result within the first hours of the onset of symptoms does not rule out myocardial infarction with certainty. If myocardial infarction is still suspected, repeat the test at appropriate intervals.   I-Stat CG4 Lactic Acid, ED     Status: None   Collection Time: 04/04/17  6:39 PM  Result Value Ref Range   Lactic Acid, Venous 0.88 0.5 - 1.9 mmol/L  Uric acid     Status: None   Collection Time: 04/04/17  8:23 PM  Result Value Ref Range   Uric Acid, Serum 3.3 2.3 - 6.6 mg/dL   Dg Chest 2 View  Result Date: 04/04/2017  CLINICAL  DATA:  Recent renal cryoablation with perinephric hematoma. Possible atelectasis. EXAM: CHEST  2 VIEW COMPARISON:  03/22/2017 FINDINGS: Sternotomy wires and left-sided pacemaker unchanged. Lungs are adequately inflated with mild left basilar opacification likely atelectasis and possible small amount of pleural fluid. Mild cardiomegaly. Remainder of the exam is unchanged. IMPRESSION: Minimal left base opacification likely atelectasis with small amount of pleural fluid. Electronically Signed   By: Marin Olp M.D.   On: 04/04/2017 17:25   Dg Ankle Complete Right  Result Date: 04/04/2017 CLINICAL DATA:  Pain and swelling in the right ankle for 1 day with bruising along the medial malleolus. EXAM: RIGHT ANKLE - COMPLETE 3+ VIEW COMPARISON:  None. FINDINGS: Abnormal soft tissue swelling overlying both the medial and lateral malleolus. Bony demineralization. Plafond and talar dome appear intact. No well-defined fracture. Plantar calcaneal spur with mild calcification in the plantar fascia and distal Achilles tendon. Atherosclerotic vascular calcifications noted. IMPRESSION: 1. Abnormal soft tissue swelling overlying both malleoli and along the heel, cause uncertain. 2. Atherosclerosis. 3. Plantar calcaneal spur.  Faint chondrocalcinosis. 4. Bony demineralization. Electronically Signed   By: Van Clines M.D.   On: 04/04/2017 14:39   Ct Abdomen Pelvis W Contrast  Result Date: 04/04/2017 CLINICAL DATA:  83 year old female status post recent biopsy and cryoablation of the left renal mass complicated by hemorrhage. Continued flank pain. EXAM: CT ABDOMEN AND PELVIS WITH CONTRAST TECHNIQUE: Multidetector CT imaging of the abdomen and pelvis was performed using the standard protocol following bolus administration of intravenous contrast. CONTRAST:  152m ISOVUE-300 IOPAMIDOL (ISOVUE-300) INJECTION 61% COMPARISON:  Multiple prior abdominal CTs dating back to 12/30/2016 FINDINGS: Lower chest: Partially visualized  small bilateral pleural effusions with associated subsegmental atelectatic changes of the left lung base similar to the prior CT of 03/29/2017. There is mild cardiomegaly. Cardiac pacemaker wires and CABG clips noted. No intraperitoneal free air or free fluid. Hepatobiliary: The liver is unremarkable. No intrahepatic biliary ductal dilatation. The gallbladder is partially contracted and appears unremarkable. Pancreas: Unremarkable. No pancreatic ductal dilatation or surrounding inflammatory changes. Spleen: Normal in size without focal abnormality. Adrenals/Urinary Tract: The adrenal glands are unremarkable. Status post cryoablation of left renal mid to lower pole mass with associated infarction. There is enhancement of the remainder of the left renal parenchyma. Smaller hypodense upper pole lesions are not well characterized but likely represent cysts. There is overall interval decrease in the size of the left perinephric hematoma compared to the study of 03/29/2017. No extravasated contrast noted to suggest active bleed. Small pockets of air again noted along the posterolateral aspect of the left kidney in the perirenal space as [name] on the prior CT. There is decreased or interval resolution of the blood [name] in the posterior pelvis on the prior CT. The right kidney is unremarkable. There is no hydronephrosis on either side. There is symmetric enhancement and excretion of contrast by both kidneys. The visualized ureters and urinary bladder appear unremarkable. Stomach/Bowel: There is no bowel obstruction or active inflammation. The appendix appears unremarkable as visualized. Vascular/Lymphatic: There is moderate aortoiliac atherosclerotic disease. The origins of the celiac axis, SMA, IMA are patent. The SMV, splenic vein, and main portal vein are patent. B renal artery stent veins appear patent. There is no adenopathy. Bilateral iliac chain surgical staples noted. Reproductive: Hysterectomy.  No pelvic mass.  Other: None Musculoskeletal: Degenerative changes of the spine and hips. No acute osseous pathology. IMPRESSION: 1. Cryoablation with infarcted appearance of the left renal mass. The remainder of the  left renal parenchyma demonstrate normal enhancement and excretion. There has been interval decrease in the size of the left perinephric hematoma compared to the CT of 03/29/2017. No evidence of active bleed. 2. Partially visualized small bilateral pleural effusions as [name] on the prior CT. 3.  Aortic Atherosclerosis (ICD10-I70.0). Electronically Signed   By: Anner Crete M.D.   On: 04/04/2017 18:42    Pending Labs Unresulted Labs (From admission, onward)   Start     Ordered   04/05/17 0500  CBC  Tomorrow morning,   R     04/04/17 1926   04/05/17 0272  Basic metabolic panel  Tomorrow morning,   R     04/04/17 1926   04/05/17 0500  Creatinine, serum  Daily,   R     04/04/17 2028   04/04/17 2036  Culture, Urine  Once,   R     04/04/17 2035   04/04/17 1936  HIV antibody  Once,   R     04/04/17 1940   04/04/17 1936  Culture, blood (routine x 2) Call MD if unable to obtain prior to antibiotics being given  BLOOD CULTURE X 2,   R    Comments:  If blood cultures drawn in Emergency Department - Do not draw and cancel order    04/04/17 1940   04/04/17 1936  Culture, sputum-assessment  Once,   R     04/04/17 1940   04/04/17 1936  Gram stain  Once,   R     04/04/17 1940   04/04/17 1936  Strep pneumoniae urinary antigen  Once,   R     04/04/17 1940      Vitals/Pain Today's Vitals   04/04/17 1851 04/04/17 2012 04/04/17 2025 04/04/17 2100  BP: 136/62 (!) 139/110 131/64 (!) 151/64  Pulse: 82 73 73 75  Resp: 19 18    Temp:      TempSrc:      SpO2: 94% 95%    Weight:      Height:        Isolation Precautions No active isolations  Medications Medications  iopamidol (ISOVUE-300) 61 % injection (not administered)  vancomycin (VANCOCIN) IVPB 1000 mg/200 mL premix (not administered)   mirabegron ER (MYRBETRIQ) tablet 25 mg (not administered)  pyridostigmine (MESTINON) tablet 60 mg (not administered)  escitalopram (LEXAPRO) tablet 20 mg (not administered)  apixaban (ELIQUIS) tablet 5 mg (not administered)  acetaminophen (TYLENOL) tablet 650 mg (not administered)  HYDROcodone-acetaminophen (NORCO/VICODIN) 5-325 MG per tablet 0.5 tablet (not administered)  piperacillin-tazobactam (ZOSYN) IVPB 3.375 g (not administered)  vancomycin (VANCOCIN) IVPB 750 mg/150 ml premix (not administered)  lisinopril (PRINIVIL,ZESTRIL) tablet 2.5 mg (not administered)  0.9 %  sodium chloride infusion ( Intravenous New Bag/Given 04/04/17 1829)  iopamidol (ISOVUE-300) 61 % injection 100 mL (100 mLs Intravenous Contrast Given 04/04/17 1757)  acetaminophen (TYLENOL) tablet 650 mg (650 mg Oral Given 04/04/17 1941)  piperacillin-tazobactam (ZOSYN) IVPB 3.375 g (3.375 g Intravenous New Bag/Given 04/04/17 2026)    Mobility walks

## 2017-04-05 LAB — BASIC METABOLIC PANEL
ANION GAP: 9 (ref 5–15)
ANION GAP: 8 (ref 5–15)
BUN: 12 mg/dL (ref 6–20)
BUN: 12 mg/dL (ref 6–20)
CALCIUM: 7.9 mg/dL — AB (ref 8.9–10.3)
CALCIUM: 7.7 mg/dL — AB (ref 8.9–10.3)
CO2: 25 mmol/L (ref 22–32)
CO2: 26 mmol/L (ref 22–32)
Chloride: 101 mmol/L (ref 101–111)
Chloride: 100 mmol/L — ABNORMAL LOW (ref 101–111)
Creatinine, Ser: 0.69 mg/dL (ref 0.44–1.00)
Creatinine, Ser: 0.66 mg/dL (ref 0.44–1.00)
GFR calc Af Amer: >60 mL/min (ref >60)
GLUCOSE: 109 mg/dL — AB (ref 65–99)
Glucose, Bld: 106 mg/dL — ABNORMAL HIGH (ref 65–99)
POTASSIUM: 3.2 mmol/L — AB (ref 3.5–5.1)
POTASSIUM: 3.4 mmol/L — AB (ref 3.5–5.1)
SODIUM: 134 mmol/L — AB (ref 135–145)
Sodium: 135 mmol/L (ref 135–145)

## 2017-04-05 LAB — CBC
HCT: 24.5 % — ABNORMAL LOW (ref 36.0–46.0)
HEMATOCRIT: 25.9 % — AB (ref 36.0–46.0)
Hemoglobin: 8.5 g/dL — ABNORMAL LOW (ref 12.0–15.0)
Hemoglobin: 8.2 g/dL — ABNORMAL LOW (ref 12.0–15.0)
MCH: 29.4 pg (ref 26.0–34.0)
MCH: 29.8 pg (ref 26.0–34.0)
MCHC: 32.8 g/dL (ref 30.0–36.0)
MCHC: 33.5 g/dL (ref 30.0–36.0)
MCV: 89.6 fL (ref 78.0–100.0)
MCV: 89.1 fL (ref 78.0–100.0)
PLATELETS: 178 10*3/uL (ref 150–400)
Platelets: 185 10*3/uL (ref 150–400)
RBC: 2.89 MIL/uL — AB (ref 3.87–5.11)
RBC: 2.75 MIL/uL — AB (ref 3.87–5.11)
RDW: 13.8 % (ref 11.5–15.5)
RDW: 13.7 % (ref 11.5–15.5)
WBC: 8.7 10*3/uL (ref 4.0–10.5)
WBC: 8.1 10*3/uL (ref 4.0–10.5)

## 2017-04-05 LAB — HIV ANTIBODY (ROUTINE TESTING W REFLEX): HIV Screen 4th Generation wRfx: NONREACTIVE

## 2017-04-05 MED ORDER — POTASSIUM CHLORIDE CRYS ER 20 MEQ PO TBCR
40.0000 meq | EXTENDED_RELEASE_TABLET | Freq: Once | ORAL | Status: AC
  Administered 2017-04-05: 40 meq via ORAL
  Filled 2017-04-05: qty 2

## 2017-04-05 MED ORDER — LEVOFLOXACIN 750 MG PO TABS: 750.0000 mg | ORAL_TABLET | ORAL | Status: DC

## 2017-04-05 MED ORDER — AZITHROMYCIN 250 MG PO TABS
500.0000 mg | ORAL_TABLET | Freq: Every day | ORAL | Status: DC
  Administered 2017-04-05 – 2017-04-08 (×4): 500 mg via ORAL
  Filled 2017-04-05 (×4): qty 2

## 2017-04-05 MED ORDER — DEXTROSE 5 % IV SOLN
1.0000 g | INTRAVENOUS | Status: DC
  Administered 2017-04-05 – 2017-04-07 (×3): 1 g via INTRAVENOUS
  Filled 2017-04-05 (×4): qty 10

## 2017-04-05 NOTE — Progress Notes (Addendum)
PROGRESS NOTE    [name] [name]  QMG:[id number] DOB: 11-Jun-1931 DOA: 04/04/2017 PCP: Marin Olp, MD   Brief Narrative: Patient is a 83 year old female with past medical history of recently diagnosed left renal mass status post cryoablation, atrial fibrillation, coronary artery disease status post CABG, systolic heart failure with ejection fraction of 25-30%, hypertension, memory loss, history of uterine cancer who presented to the emergency department with complaints of progressive generalized weakness, inability to ambulate and increasing confusion.  Patient was also found to be febrile in the emergency department.  Chest x-ray done on admission shows minimal left lower base opacification likely atelectasis.  Patient admitted for management of possible pneumonia and increased generalized weakness.    Assessment & Plan:   Principal Problem:   HCAP (healthcare-associated pneumonia) Active Problems:   Essential hypertension   Ischemic cardiomyopathy  EF 30% cath 4/14 with stent   Chronic combined systolic and diastolic congestive heart failure (HCC)   Paroxysmal atrial fibrillation ([place])   Pacemaker -CRT-St. Jude   Left renal mass   Acute blood loss as cause of postoperative anemia  Healthcare associated pneumonia: Patient was recently discharged on 12/20.  Chest x-ray done on admission shows minimal left base opacity likely atelectasis.  Patient has been started on broad-spectrum antibiotics.  Pneumonia is less likely at this point but will continue antibiotics for now.  Abx changed to  Levaquin.Currently patient's respiratory status stable.  Currently saturating normally on room air.  Patient was not dyspneic or coughing during my evaluation this morning. We will follow cultures.  Ischemic cardiomyopathy with ejection fraction of 30%: Follows with cardiology.  Last ejection fraction of 30%.  Status post CABG, stents. Cardiac status currently stable.  We will resume her home  medications.   Paroxysmal A. fib: On Eliquis at home.  Resumed here  Left renal mass:Recently diagnosed with mass of left lower kidney in 12/2016.  Did not have any metastatic disease on imaging and is status post cryoablation by radiology.  Post procedure complication with perinephric hematoma and anemia. CT imaging done here showed decreased size of hematoma on comparison to last imaging.  Patient follows with urologist Dr. Alinda Money  Hypertension: On lisinopril at home.  We will continue to monitor her blood pressure.  Postoperative anemia: Patient was noted to be anemic after cryoablation of the renal mass and postoperative complication with perinephric hematoma.  Currently H&H is stable and does not require transfusion.  We will continue to monitor.  Swelling/pain of the right ankle: Gout was suspected.  Uric acid is normal.  We will continue supportive care.  Generalized weakness: Consulted PT  DVT prophylaxis: Eliquis Code Status: Full Family Communication: None presented at the bedside Disposition Plan: To home in 2-3 days   Consultants: None  Procedures: None  Antimicrobials: On vancomycin and Zosyn since 12/24  Subjective: Patient seen and examined at the bedside this morning.  Her overall condition has improved.  He still complains of generalized weakness, also has vague nonspecific complaints.  Vitals are stable.  Respiratory status stable.   Objective: Vitals:   04/04/17 2025 04/04/17 2100 04/04/17 2217 04/05/17 0354  BP: 131/64 (!) 151/64 127/72 (!) 155/57  Pulse: 73 75 77 75  Resp:   16 16  Temp:   98.5 F (36.9 C) 98.6 F (37 C)  TempSrc:   Oral Oral  SpO2:   94% 100%  Weight:   65.1 kg (143 lb 8.3 oz)   Height:   5\' 8"  (1.727 m)  Intake/Output Summary (Last 24 hours) at 04/05/2017 1355 Last data filed at 04/05/2017 0940 Gross per 24 hour  Intake 120 ml  Output 600 ml  Net -480 ml   Filed Weights   04/04/17 1845 04/04/17 2217  Weight: 61.7 kg  (136 lb) 65.1 kg (143 lb 8.3 oz)    Examination:  General exam: Generalized weakness,Not in distress,average built Respiratory system: Bilateral equal air entry, normal vesicular breath sounds, no wheezes or crackles  Cardiovascular system: S1 & S2 heard, RRR. No JVD, murmurs, rubs, gallops or clicks. No pedal edema. Gastrointestinal system: Abdomen is nondistended, soft and nontender. No organomegaly or masses felt. Normal bowel sounds heard. Central nervous system: Alert and oriented. No focal neurological deficits. Extremities:  no clubbing ,no cyanosis, distal peripheral pulses palpable. Pain and edema of the right ankle Skin: No cyanosis,No pallor,No Rash,No Ulcer Psychiatry: Judgement and insight appear normal. Mood & affect appropriate.     Data Reviewed: I have personally reviewed following labs and imaging studies  CBC: Recent Labs  Lab 03/29/17 1420  03/31/17 0645 04/04/17 1213 04/04/17 1420 04/05/17 0401 04/05/17 0547  WBC 9.9   < > 8.0 10.0 10.1 8.7 8.1  NEUTROABS 7.8*  --   --   --   --   --   --   HGB 8.6*   < > 8.4* 8.6* 8.8* 8.5* 8.2*  HCT 25.7*   < > 25.5* 25.7* 27.1* 25.9* 24.5*  MCV 90.5   < > 90.7 88.3 89.1 89.6 89.1  PLT 91*   < > 78* 173 189 185 178   < > = values in this interval not displayed.   Basic Metabolic Panel: Recent Labs  Lab 03/31/17 0645 04/04/17 1213 04/04/17 1420 04/05/17 0401 04/05/17 0547  NA 139 134* 134* 135 134*  K 3.7 4.2 3.4* 3.2* 3.4*  CL 108 98 98* 101 100*  CO2 27 27 29 25 26   GLUCOSE 106* 110* 113* 106* 109*  BUN 12 14 14 12 12   CREATININE 0.71 0.69 0.68 0.69 0.66  CALCIUM 8.0* 8.1* 8.2* 7.9* 7.7*   GFR: Estimated Creatinine Clearance: 53.7 mL/min (by C-G formula based on SCr of 0.66 mg/dL). Liver Function Tests: No results for input(s): AST, ALT, ALKPHOS, BILITOT, PROT, ALBUMIN in the last 168 hours. No results for input(s): LIPASE, AMYLASE in the last 168 hours. No results for input(s): AMMONIA in the last 168  hours. Coagulation Profile: Recent Labs  Lab 03/30/17 0150  INR 1.10   Cardiac Enzymes: No results for input(s): CKTOTAL, CKMB, CKMBINDEX, TROPONINI in the last 168 hours. BNP (last 3 results) No results for input(s): PROBNP in the last 8760 hours. HbA1C: No results for input(s): HGBA1C in the last 72 hours. CBG: Recent Labs  Lab 04/04/17 1626  GLUCAP 111*   Lipid Profile: No results for input(s): CHOL, HDL, LDLCALC, TRIG, CHOLHDL, LDLDIRECT in the last 72 hours. Thyroid Function Tests: No results for input(s): TSH, T4TOTAL, FREET4, T3FREE, THYROIDAB in the last 72 hours. Anemia Panel: No results for input(s): VITAMINB12, FOLATE, FERRITIN, TIBC, IRON, RETICCTPCT in the last 72 hours. Sepsis Labs: Recent Labs  Lab 04/04/17 1839  LATICACIDVEN 0.88    Recent Results (from the past 240 hour(s))  Culture, blood (routine x 2) Call MD if unable to obtain prior to antibiotics being given     Status: None (Preliminary result)   Collection Time: 04/04/17  8:23 PM  Result Value Ref Range Status   Specimen Description BLOOD LEFT HAND  Final  Special Requests   Final    BOTTLES DRAWN AEROBIC AND ANAEROBIC Blood Culture adequate volume   Culture   Final    NO GROWTH < 12 HOURS Performed at [facility], [place] [address] [place], [place] [address]    Report Status PENDING  Incomplete  Culture, blood (routine x 2) Call MD if unable to obtain prior to antibiotics being given     Status: None (Preliminary result)   Collection Time: 04/04/17  8:23 PM  Result Value Ref Range Status   Specimen Description BLOOD RIGHT FOREARM  Final   Special Requests IN PEDIATRIC BOTTLE Blood Culture adequate volume  Final   Culture   Final    NO GROWTH < 12 HOURS Performed at [facility], [place] [address] [place], [place] [address]    Report Status PENDING  Incomplete         Radiology Studies: Dg Chest 2 View  Result Date: 04/04/2017 CLINICAL DATA:  Recent renal cryoablation  with perinephric hematoma. Possible atelectasis. EXAM: CHEST  2 VIEW COMPARISON:  03/22/2017 FINDINGS: Sternotomy wires and left-sided pacemaker unchanged. Lungs are adequately inflated with mild left basilar opacification likely atelectasis and possible small amount of pleural fluid. Mild cardiomegaly. Remainder of the exam is unchanged. IMPRESSION: Minimal left base opacification likely atelectasis with small amount of pleural fluid. Electronically Signed   By: Marin Olp M.D.   On: 04/04/2017 17:25   Dg Ankle Complete Right  Result Date: 04/04/2017 CLINICAL DATA:  Pain and swelling in the right ankle for 1 day with bruising along the medial malleolus. EXAM: RIGHT ANKLE - COMPLETE 3+ VIEW COMPARISON:  None. FINDINGS: Abnormal soft tissue swelling overlying both the medial and lateral malleolus. Bony demineralization. Plafond and talar dome appear intact. No well-defined fracture. Plantar calcaneal spur with mild calcification in the plantar fascia and distal Achilles tendon. Atherosclerotic vascular calcifications noted. IMPRESSION: 1. Abnormal soft tissue swelling overlying both malleoli and along the heel, cause uncertain. 2. Atherosclerosis. 3. Plantar calcaneal spur.  Faint chondrocalcinosis. 4. Bony demineralization. Electronically Signed   By: Van Clines M.D.   On: 04/04/2017 14:39   Ct Abdomen Pelvis W Contrast  Result Date: 04/04/2017 CLINICAL DATA:  83 year old female status post recent biopsy and cryoablation of the left renal mass complicated by hemorrhage. Continued flank pain. EXAM: CT ABDOMEN AND PELVIS WITH CONTRAST TECHNIQUE: Multidetector CT imaging of the abdomen and pelvis was performed using the standard protocol following bolus administration of intravenous contrast. CONTRAST:  166mL ISOVUE-300 IOPAMIDOL (ISOVUE-300) INJECTION 61% COMPARISON:  Multiple prior abdominal CTs dating back to 12/30/2016 FINDINGS: Lower chest: Partially visualized small bilateral pleural  effusions with associated subsegmental atelectatic changes of the left lung base similar to the prior CT of 03/29/2017. There is mild cardiomegaly. Cardiac pacemaker wires and CABG clips noted. No intraperitoneal free air or free fluid. Hepatobiliary: The liver is unremarkable. No intrahepatic biliary ductal dilatation. The gallbladder is partially contracted and appears unremarkable. Pancreas: Unremarkable. No pancreatic ductal dilatation or surrounding inflammatory changes. Spleen: Normal in size without focal abnormality. Adrenals/Urinary Tract: The adrenal glands are unremarkable. Status post cryoablation of left renal mid to lower pole mass with associated infarction. There is enhancement of the remainder of the left renal parenchyma. Smaller hypodense upper pole lesions are not well characterized but likely represent cysts. There is overall interval decrease in the size of the left perinephric hematoma compared to the study of 03/29/2017. No extravasated contrast noted to suggest active bleed. Small pockets  of air again noted along the posterolateral aspect of the left kidney in the perirenal space as seen on the prior CT. There is decreased or interval resolution of the blood seen in the posterior pelvis on the prior CT. The right kidney is unremarkable. There is no hydronephrosis on either side. There is symmetric enhancement and excretion of contrast by both kidneys. The visualized ureters and urinary bladder appear unremarkable. Stomach/Bowel: There is no bowel obstruction or active inflammation. The appendix appears unremarkable as visualized. Vascular/Lymphatic: There is moderate aortoiliac atherosclerotic disease. The origins of the celiac axis, SMA, IMA are patent. The SMV, splenic vein, and main portal vein are patent. B renal artery stent veins appear patent. There is no adenopathy. Bilateral iliac chain surgical staples noted. Reproductive: Hysterectomy.  No pelvic mass. Other: None Musculoskeletal:  Degenerative changes of the spine and hips. No acute osseous pathology. IMPRESSION: 1. Cryoablation with infarcted appearance of the left renal mass. The remainder of the left renal parenchyma demonstrate normal enhancement and excretion. There has been interval decrease in the size of the left perinephric hematoma compared to the CT of 03/29/2017. No evidence of active bleed. 2. Partially visualized small bilateral pleural effusions as seen on the prior CT. 3.  Aortic Atherosclerosis (ICD10-I70.0). Electronically Signed   By: Anner Crete M.D.   On: 04/04/2017 18:42        Scheduled Meds: . apixaban  5 mg Oral BID  . escitalopram  20 mg Oral QHS  . lisinopril  2.5 mg Oral QHS  . mirabegron ER  25 mg Oral Daily  . pyridostigmine  60 mg Oral BID   Continuous Infusions: . piperacillin-tazobactam (ZOSYN)  IV 3.375 g (04/05/17 0555)  . vancomycin       LOS: 1 day    Time spent:     Marene Lenz, MD [facility] Pager [phone]  If 7PM-7AM, please contact night-coverage [email or web address] Password [email or web address] 04/05/2017, 1:55 PM

## 2017-04-06 ENCOUNTER — Inpatient Hospital Stay (HOSPITAL_COMMUNITY): Payer: Medicare Other

## 2017-04-06 ENCOUNTER — Encounter (HOSPITAL_COMMUNITY): Payer: Self-pay | Admitting: *Deleted

## 2017-04-06 LAB — URINE CULTURE: CULTURE: NO GROWTH

## 2017-04-06 LAB — BASIC METABOLIC PANEL
ANION GAP: 7 (ref 5–15)
BUN: 11 mg/dL (ref 6–20)
CALCIUM: 8 mg/dL — AB (ref 8.9–10.3)
CO2: 26 mmol/L (ref 22–32)
Chloride: 101 mmol/L (ref 101–111)
Creatinine, Ser: 0.61 mg/dL (ref 0.44–1.00)
GFR calc Af Amer: >60 mL/min (ref >60)
GLUCOSE: 116 mg/dL — AB (ref 65–99)
POTASSIUM: 3.5 mmol/L (ref 3.5–5.1)
SODIUM: 134 mmol/L — AB (ref 135–145)

## 2017-04-06 NOTE — Progress Notes (Signed)
Patient's labs are stable. She is currently hospitalized and has had more recent labwork since these were drawn. Do not need to make any changes.  [name] [name]. [name], MD 04/06/2017 7:59 AM

## 2017-04-06 NOTE — NC FL2 (Signed)
[place] LEVEL OF CARE SCREENING TOOL     IDENTIFICATION  Patient Name: [name] [name] Birthdate: [birth year] Sex: female Admission Date (Current Location): 04/04/2017  [facility] and [place] Number:  Herbalist and Address:  [facility],  [place] [address] [place]      Provider Number: [id number]  Attending Physician Name and Address:  Marene Lenz, MD  Relative Name and Phone Number:       Current Level of Care: Hospital Recommended Level of Care: [place] Prior Approval Number:    Date Approved/Denied:   PASRR Number: [id number]  Discharge Plan: SNF    Current Diagnoses: Patient Active Problem List   Diagnosis Date Noted  . HCAP (healthcare-associated pneumonia) 04/04/2017  . Traumatic perinephric hematoma of left kidney 03/29/2017  . Acute blood loss as cause of postoperative anemia 03/29/2017  . Left renal mass 01/27/2017  . Overactive bladder 07/09/2016  . Dizziness 05/07/2016  . Microscopic hematuria 09/02/2015  . CAD (coronary artery disease) 04/17/2014  . Allergic rhinitis 04/17/2014  . Mild cognitive impairment 04/17/2014  . Hearing loss 04/17/2014  . Pacemaker -CRT-St. Jude 05/21/2013  . Atrioventricular block, complete ([place]) 03/08/2013  . Paroxysmal atrial fibrillation ([place]) 02/03/2013  . Asthma, intrinsic 02/03/2013  . Chronic combined systolic and diastolic congestive heart failure ([place]) 10/10/2012  . Depression 09/19/2012  . Varicose veins 09/29/2009  . History of melanoma 09/06/2008  . LBBB (left bundle branch block) 09/06/2008  . History of uterine cancer 09/06/2008  . Hyperlipidemia 10/06/2007  . Essential hypertension 10/06/2007  . Ischemic cardiomyopathy  EF 30% cath 4/14 with stent 10/06/2007    Orientation RESPIRATION BLADDER Height & Weight     Self, Situation, Place  Normal Continent Weight: 143 lb 8.3 oz (65.1 kg) Height:  5\' 8"  (172.7 cm)  BEHAVIORAL SYMPTOMS/MOOD  NEUROLOGICAL BOWEL NUTRITION STATUS      Continent Diet(heart healthy)  AMBULATORY STATUS COMMUNICATION OF NEEDS Skin   Limited Assist Verbally Other (Comment)(chest port 03/28/17)                       Personal Care Assistance Level of Assistance  Bathing, Feeding, Dressing Bathing Assistance: Limited assistance Feeding assistance: Independent Dressing Assistance: Limited assistance     Functional Limitations Info  Sight, Hearing, Speech Sight Info: Adequate Hearing Info: Adequate Speech Info: Adequate    SPECIAL CARE FACTORS FREQUENCY  PT (By licensed PT), OT (By licensed OT)     PT Frequency: 5x OT Frequency: 5x            Contractures Contractures Info: Not present    Additional Factors Info  Code Status, Allergies Code Status Info: full code Allergies Info: nitroglycerin, aricept           Current Medications (04/06/2017):  This is the current hospital active medication list Current Facility-Administered Medications  Medication Dose Route Frequency Provider Last Rate Last Dose  . acetaminophen (TYLENOL) tablet 650 mg  650 mg Oral Q6H PRN Etta Quill, DO   650 mg at 04/05/17 2023  . apixaban (ELIQUIS) tablet 5 mg  5 mg Oral BID Jennette Kettle M, DO   5 mg at 04/05/17 2203  . azithromycin (ZITHROMAX) tablet 500 mg  500 mg Oral Daily Marene Lenz, MD   500 mg at 04/05/17 1637  . cefTRIAXone (ROCEPHIN) 1 g in dextrose 5 % 50 mL IVPB  1 g Intravenous Q24H Marene Lenz, MD   Stopped  at 04/05/17 1708  . escitalopram (LEXAPRO) tablet 20 mg  20 mg Oral QHS Jennette Kettle M, DO   20 mg at 04/05/17 2203  . HYDROcodone-acetaminophen (NORCO/VICODIN) 5-325 MG per tablet 0.5 tablet  0.5 tablet Oral Q6H PRN Etta Quill, DO      . lisinopril (PRINIVIL,ZESTRIL) tablet 2.5 mg  2.5 mg Oral QHS Jennette Kettle M, DO   2.5 mg at 04/05/17 2203  . mirabegron ER (MYRBETRIQ) tablet 25 mg  25 mg Oral Daily Jennette Kettle M, DO   25 mg at 04/05/17 1012  .  pyridostigmine (MESTINON) tablet 60 mg  60 mg Oral BID Etta Quill, DO   60 mg at 04/05/17 2203     Discharge Medications: Please see discharge summary for a list of discharge medications.  Relevant Imaging Results:  Relevant Lab Results:   Additional Information SS# [id number]  Nila Nephew, LCSW

## 2017-04-06 NOTE — Progress Notes (Signed)
PROGRESS NOTE    [name] [name]  RCV:[id number] DOB: 10/04/1931 DOA: 04/04/2017 PCP: Marin Olp, MD   Brief Narrative: Patient is a 83 year old female with past medical history of recently diagnosed left renal mass status post cryoablation, atrial fibrillation, coronary artery disease status post CABG, systolic heart failure with ejection fraction of 25-30%, hypertension, memory loss, history of uterine cancer who presented to the emergency department with complaints of progressive generalized weakness, inability to ambulate and increasing confusion.  Patient was also found to be febrile in the emergency department.  Chest x-ray done on admission shows minimal left lower base opacification likely atelectasis.  Patient admitted for management of possible pneumonia and increased generalized weakness.    Assessment & Plan:   Principal Problem:   HCAP (healthcare-associated pneumonia) Active Problems:   Essential hypertension   Ischemic cardiomyopathy  EF 30% cath 4/14 with stent   Chronic combined systolic and diastolic congestive heart failure (HCC)   Paroxysmal atrial fibrillation ([place])   Pacemaker -CRT-St. Jude   Left renal mass   Acute blood loss as cause of postoperative anemia  Healthcare associated pneumonia: Patient was recently discharged on 12/20.  Chest x-ray done on admission shows minimal left base opacity likely atelectasis.  Patient was started on broad-spectrum antibiotics.  Pneumonia is questionable  at this point but will continue antibiotics for now.  Abx changed to  Azithro and Ceftrixone..Currently patient's respiratory status stable.  Currently saturating normally on room air.  Patient is not dyspneic or coughing during my evaluation this morning. We will follow cultures.  Ischemic cardiomyopathy with ejection fraction of 30%: Follows with cardiology.  Last ejection fraction of 30%.  Status post CABG, stents. Cardiac status currently stable.  We will resume her  home medications.   Paroxysmal A. fib: On Eliquis at home.  Resumed here  Left renal mass:Recently diagnosed with mass of left lower kidney in 12/2016.  Did not have any metastatic disease on imaging and is status post cryoablation by radiology.  Post procedure complication with perinephric hematoma and anemia. CT imaging done here showed decreased size of hematoma on comparison to last imaging.  Patient follows with urologist Dr. Alinda Money  Hypertension: On lisinopril at home.  We will continue to monitor her blood pressure.  Postoperative anemia: Patient was noted to be anemic after cryoablation of the renal mass and postoperative complication with perinephric hematoma.  Currently H&H is stable and does not require transfusion.  We will continue to monitor.  Swelling/pain of the right ankle: Gout was suspected.  Uric acid is normal.  We will continue supportive care.  Generalized weakness: Consulted PT. recommended skilled nursing facility on discharge  Fall: Patient fell last night.  Was complaining of pain in her right chest.  Chest x-ray ordered ,did not show any fractures of the ribs.  DVT prophylaxis: Eliquis Code Status: Full Family Communication: None presented at the bedside Disposition Plan: SNF   Consultants: None  Procedures: None  Antimicrobials: On ceftriaxone and azithromycin since 12/25  Subjective: Patient seen and examined the bedside this morning.  She participated with physical therapy today.  Her respiratory status is stable.  Patient fell last night and was complaining of pain on her right side.   Objective: Vitals:   04/05/17 2132 04/06/17 0000 04/06/17 0201 04/06/17 0454  BP:  131/74 130/77 126/68  Pulse:  72 73 86  Resp:  18 18 18   Temp: 99.3 F (37.4 C) 98.8 F (37.1 C) 98.6 F (37 C) 98.3 F (36.8  C)  TempSrc: Oral Oral Oral Oral  SpO2:  98% 97% 98%  Weight:      Height:        Intake/Output Summary (Last 24 hours) at 04/06/2017 1337 Last  data filed at 04/06/2017 8242 Gross per 24 hour  Intake 770 ml  Output -  Net 770 ml   Filed Weights   04/04/17 1845 04/04/17 2217  Weight: 61.7 kg (136 lb) 65.1 kg (143 lb 8.3 oz)    Examination:  General exam: Appears calm and comfortable ,Not in distress,average built Respiratory system: Bilateral equal air entry, normal vesicular breath sounds, no wheezes or crackles  Cardiovascular system: S1 & S2 heard, RRR. No JVD, murmurs, rubs, gallops or clicks. No pedal edema. Gastrointestinal system: Abdomen is nondistended, soft and nontender. No organomegaly or masses felt. Normal bowel sounds heard. Central nervous system: Alert and oriented. No focal neurological deficits. Extremities: No edema, no clubbing ,no cyanosis, distal peripheral pulses palpable. Ulcer on her right elbow. Skin: No cyanosis,No pallor,No Rash Psychiatry: Judgement and insight appear normal. Mood & affect appropriate.     Data Reviewed: I have personally reviewed following labs and imaging studies  CBC: Recent Labs  Lab 03/31/17 0645 04/04/17 1213 04/04/17 1420 04/05/17 0401 04/05/17 0547  WBC 8.0 10.0 10.1 8.7 8.1  HGB 8.4* 8.6* 8.8* 8.5* 8.2*  HCT 25.5* 25.7* 27.1* 25.9* 24.5*  MCV 90.7 88.3 89.1 89.6 89.1  PLT 78* 173 189 185 353   Basic Metabolic Panel: Recent Labs  Lab 04/04/17 1213 04/04/17 1420 04/05/17 0401 04/05/17 0547 04/06/17 0340  NA 134* 134* 135 134* 134*  K 4.2 3.4* 3.2* 3.4* 3.5  CL 98 98* 101 100* 101  CO2 27 29 25 26 26   GLUCOSE 110* 113* 106* 109* 116*  BUN 14 14 12 12 11   CREATININE 0.69 0.68 0.69 0.66 0.61  CALCIUM 8.1* 8.2* 7.9* 7.7* 8.0*   GFR: Estimated Creatinine Clearance: 53.7 mL/min (by C-G formula based on SCr of 0.61 mg/dL). Liver Function Tests: No results for input(s): AST, ALT, ALKPHOS, BILITOT, PROT, ALBUMIN in the last 168 hours. No results for input(s): LIPASE, AMYLASE in the last 168 hours. No results for input(s): AMMONIA in the last 168  hours. Coagulation Profile: No results for input(s): INR, PROTIME in the last 168 hours. Cardiac Enzymes: No results for input(s): CKTOTAL, CKMB, CKMBINDEX, TROPONINI in the last 168 hours. BNP (last 3 results) No results for input(s): PROBNP in the last 8760 hours. HbA1C: No results for input(s): HGBA1C in the last 72 hours. CBG: Recent Labs  Lab 04/04/17 1626  GLUCAP 111*   Lipid Profile: No results for input(s): CHOL, HDL, LDLCALC, TRIG, CHOLHDL, LDLDIRECT in the last 72 hours. Thyroid Function Tests: No results for input(s): TSH, T4TOTAL, FREET4, T3FREE, THYROIDAB in the last 72 hours. Anemia Panel: No results for input(s): VITAMINB12, FOLATE, FERRITIN, TIBC, IRON, RETICCTPCT in the last 72 hours. Sepsis Labs: Recent Labs  Lab 04/04/17 1839  LATICACIDVEN 0.88    Recent Results (from the past 240 hour(s))  Culture, blood (routine x 2) Call MD if unable to obtain prior to antibiotics being given     Status: None (Preliminary result)   Collection Time: 04/04/17  8:23 PM  Result Value Ref Range Status   Specimen Description BLOOD LEFT HAND  Final   Special Requests   Final    BOTTLES DRAWN AEROBIC AND ANAEROBIC Blood Culture adequate volume   Culture   Final    NO GROWTH < 12  HOURS Performed at [facility], [place] [address] [place], [place] [address]    Report Status PENDING  Incomplete  Culture, blood (routine x 2) Call MD if unable to obtain prior to antibiotics being given     Status: None (Preliminary result)   Collection Time: 04/04/17  8:23 PM  Result Value Ref Range Status   Specimen Description BLOOD RIGHT FOREARM  Final   Special Requests IN PEDIATRIC BOTTLE Blood Culture adequate volume  Final   Culture   Final    NO GROWTH < 12 HOURS Performed at [facility], [place] [address], [place], [place] [address]    Report Status PENDING  Incomplete  Culture, Urine     Status: None   Collection Time: 04/04/17  8:36 PM  Result Value Ref Range Status    Specimen Description URINE, RANDOM  Final   Special Requests NONE  Final   Culture   Final    NO GROWTH Performed at [facility], [place] [address] [place], [place] [address]    Report Status 04/06/2017 FINAL  Final         Radiology Studies: Dg Chest 1 View  Result Date: 04/06/2017 CLINICAL DATA:  Epigastric pain, weakness EXAM: CHEST 1 VIEW COMPARISON:  04/04/2017 FINDINGS: There is no focal consolidation. There is mild left basilar scarring. There is no pleural effusion or pneumothorax. There is evidence of prior CABG. There is a dual lead cardiac pacemaker. The osseous structures are unremarkable. IMPRESSION: No active disease. Electronically Signed   By: Kathreen Devoid   On: 04/06/2017 12:19   Dg Chest 2 View  Result Date: 04/04/2017 CLINICAL DATA:  Recent renal cryoablation with perinephric hematoma. Possible atelectasis. EXAM: CHEST  2 VIEW COMPARISON:  03/22/2017 FINDINGS: Sternotomy wires and left-sided pacemaker unchanged. Lungs are adequately inflated with mild left basilar opacification likely atelectasis and possible small amount of pleural fluid. Mild cardiomegaly. Remainder of the exam is unchanged. IMPRESSION: Minimal left base opacification likely atelectasis with small amount of pleural fluid. Electronically Signed   By: Marin Olp M.D.   On: 04/04/2017 17:25   Dg Ankle Complete Right  Result Date: 04/04/2017 CLINICAL DATA:  Pain and swelling in the right ankle for 1 day with bruising along the medial malleolus. EXAM: RIGHT ANKLE - COMPLETE 3+ VIEW COMPARISON:  None. FINDINGS: Abnormal soft tissue swelling overlying both the medial and lateral malleolus. Bony demineralization. Plafond and talar dome appear intact. No well-defined fracture. Plantar calcaneal spur with mild calcification in the plantar fascia and distal Achilles tendon. Atherosclerotic vascular calcifications noted. IMPRESSION: 1. Abnormal soft tissue swelling overlying both malleoli and along the  heel, cause uncertain. 2. Atherosclerosis. 3. Plantar calcaneal spur.  Faint chondrocalcinosis. 4. Bony demineralization. Electronically Signed   By: Van Clines M.D.   On: 04/04/2017 14:39   Ct Abdomen Pelvis W Contrast  Result Date: 04/04/2017 CLINICAL DATA:  83 year old female status post recent biopsy and cryoablation of the left renal mass complicated by hemorrhage. Continued flank pain. EXAM: CT ABDOMEN AND PELVIS WITH CONTRAST TECHNIQUE: Multidetector CT imaging of the abdomen and pelvis was performed using the standard protocol following bolus administration of intravenous contrast. CONTRAST:  127mL ISOVUE-300 IOPAMIDOL (ISOVUE-300) INJECTION 61% COMPARISON:  Multiple prior abdominal CTs dating back to 12/30/2016 FINDINGS: Lower chest: Partially visualized small bilateral pleural effusions with associated subsegmental atelectatic changes of the left lung base similar to the prior CT of 03/29/2017. There is mild cardiomegaly. Cardiac pacemaker wires and CABG  clips noted. No intraperitoneal free air or free fluid. Hepatobiliary: The liver is unremarkable. No intrahepatic biliary ductal dilatation. The gallbladder is partially contracted and appears unremarkable. Pancreas: Unremarkable. No pancreatic ductal dilatation or surrounding inflammatory changes. Spleen: Normal in size without focal abnormality. Adrenals/Urinary Tract: The adrenal glands are unremarkable. Status post cryoablation of left renal mid to lower pole mass with associated infarction. There is enhancement of the remainder of the left renal parenchyma. Smaller hypodense upper pole lesions are not well characterized but likely represent cysts. There is overall interval decrease in the size of the left perinephric hematoma compared to the study of 03/29/2017. No extravasated contrast noted to suggest active bleed. Small pockets of air again noted along the posterolateral aspect of the left kidney in the perirenal space as seen on the  prior CT. There is decreased or interval resolution of the blood seen in the posterior pelvis on the prior CT. The right kidney is unremarkable. There is no hydronephrosis on either side. There is symmetric enhancement and excretion of contrast by both kidneys. The visualized ureters and urinary bladder appear unremarkable. Stomach/Bowel: There is no bowel obstruction or active inflammation. The appendix appears unremarkable as visualized. Vascular/Lymphatic: There is moderate aortoiliac atherosclerotic disease. The origins of the celiac axis, SMA, IMA are patent. The SMV, splenic vein, and main portal vein are patent. B renal artery stent veins appear patent. There is no adenopathy. Bilateral iliac chain surgical staples noted. Reproductive: Hysterectomy.  No pelvic mass. Other: None Musculoskeletal: Degenerative changes of the spine and hips. No acute osseous pathology. IMPRESSION: 1. Cryoablation with infarcted appearance of the left renal mass. The remainder of the left renal parenchyma demonstrate normal enhancement and excretion. There has been interval decrease in the size of the left perinephric hematoma compared to the CT of 03/29/2017. No evidence of active bleed. 2. Partially visualized small bilateral pleural effusions as seen on the prior CT. 3.  Aortic Atherosclerosis (ICD10-I70.0). Electronically Signed   By: Anner Crete M.D.   On: 04/04/2017 18:42        Scheduled Meds: . apixaban  5 mg Oral BID  . azithromycin  500 mg Oral Daily  . escitalopram  20 mg Oral QHS  . lisinopril  2.5 mg Oral QHS  . mirabegron ER  25 mg Oral Daily  . pyridostigmine  60 mg Oral BID   Continuous Infusions: . cefTRIAXone (ROCEPHIN)  IV Stopped (04/05/17 1708)     LOS: 2 days    Time spent:     Marene Lenz, MD [facility] Pager [phone]  If 7PM-7AM, please contact night-coverage [email or web address] Password [email or web address] 04/06/2017, 1:37 PM

## 2017-04-06 NOTE — Progress Notes (Signed)
Date: April 06, 2017 Rhonda Davis, BSN, RN3, CCM [phone] Chart and notes review for patient progress and needs. Will follow for case management and discharge needs. Next review date: [id number] 

## 2017-04-06 NOTE — Progress Notes (Signed)
   04/06/17 0000  What Happened  Was fall witnessed? No  Was patient injured? Yes (skin tear)  Patient found on floor  Found by Staff-comment (Juana/Edelyn Heidel)  Stated prior activity ambulating-unassisted  Follow Up  MD notified Baltazar Najjar, NP  Time MD notified [phone]  Family notified Yes-comment [name])  Time family notified 0017  Additional tests No  Simple treatment Dressing  Adult Fall Risk Assessment  Risk Factor Category (scoring not indicated) Fall has occurred during this admission (document High fall risk)  Age 83  Fall History: Fall within 6 months prior to admission 0  Elimination; Bowel and/or Urine Incontinence 0  Elimination; Bowel and/or Urine Urgency/Frequency 2  Medications: includes PCA/Opiates, Anti-convulsants, Anti-hypertensives, Diuretics, Hypnotics, Laxatives, Sedatives, and Psychotropics 3  Patient Care Equipment 1  Mobility-Assistance 2  Mobility-Gait 2  Mobility-Sensory Deficit 0  Altered awareness of immediate physical environment 1  Impulsiveness 2  Lack of understanding of one's physical/cognitive limitations 4  Total Score 20  Patient's Fall Risk High Fall Risk (>13 points)  Adult Fall Risk Interventions  Required Bundle Interventions *See Row Information* High fall risk - low, moderate, and high requirements implemented  Additional Interventions Room near nurses station  Screening for Fall Injury Risk  Risk For Fall Injury- See Row Information  None identified  Vitals  Temp 98.8 F (37.1 C)  Temp Source Oral  BP 131/74  BP Location Left Arm  BP Method Automatic  Patient Position (if appropriate) Lying  Pulse Rate 72  Pulse Rate Source Dinamap  Resp 18  Oxygen Therapy  SpO2 98 %  O2 Device Room Air  Pain Assessment  Pain Assessment No/denies pain  Pain Score 0  PCA/Epidural/Spinal Assessment  Respiratory Pattern Regular;Unlabored  Neurological  Neuro (WDL) X  Level of Consciousness Alert  Orientation Level Oriented to  person;Oriented to time;Oriented to place;Disoriented to situation  Cognition Appropriate at baseline  Speech Clear  Neuro Symptoms Forgetful  Glasgow Coma Scale  Eye Opening 4  Best Motor Response 6  Best Verbal Response (NON-intubated) 5  Glasgow Coma Scale Score 15  Musculoskeletal  Musculoskeletal (WDL) X  Assistive Device BSC  Generalized Weakness Yes  Weight Bearing Restrictions No  Integumentary  Integumentary (WDL) X  Skin Color Pale  Skin Condition Dry  Skin Integrity Skin tear  Skin Tear Location Orientation Right;Posterior  Skin Tear Intervention Cleansed;Foam   Patient observe on floor in patient room by patient door.  VSS, no c/o pain, no sign of distress.  Skin tear noted to right posterior forearm, area cleansed allevyn foam applied.  Daughter, [name] made aware.  Provider made aware.

## 2017-04-06 NOTE — Evaluation (Signed)
Physical Therapy Evaluation Patient Details Name: [name] [name] MRN: [id number] DOB: [birth year] Today's Date: 04/06/2017   History of Present Illness  [name] [name] is a 83 y.o. female admitted with dx of HCP and R ankle swelling and with history of atrial fibrillation, coronary artery disease s/p CABG, sCHF with EF of 25-30%, hypertension, memory loss, history of uterine cancer, and more recently diagnosed left renal mass.  The patient was incidentally found to have a 3.2 cm enhancing mass of the left lower kidney in September of this year.  She did not have any metastatic disease on imaging and was referred for an ablation by radiology.  She presented on 12/17 and underwent left renal cryoablation.  Postprocedure she developed a perinephric hematoma.  Patient is s/p left renal cryoablation   Clinical Impression  Pt admitted as above and presenting with functional mobility limitations 2* generalized weakness, ambulatory balance deficits and dementia related cognition.  Pt would benefit from follow up rehab at SNF level to maximize IND and safety.    Follow Up Recommendations SNF    Equipment Recommendations  None recommended by PT    Recommendations for Other Services       Precautions / Restrictions Precautions Precautions: Fall Restrictions Weight Bearing Restrictions: No      Mobility  Bed Mobility Overal bed mobility: Needs Assistance Bed Mobility: Supine to Sit     Supine to sit: Min assist;Mod assist     General bed mobility comments: Increased time, use of rail and assist to bring trunk to upright  Transfers Overall transfer level: Needs assistance Equipment used: Rolling walker (2 wheeled) Transfers: Sit to/from Stand Sit to Stand: From elevated surface;Min assist;Mod assist;+2 physical assistance         General transfer comment: cues for safe transition position and use of UEs to self assist  Ambulation/Gait Ambulation/Gait assistance: Min assist;+2  safety/equipment Ambulation Distance (Feet): 100 Feet Assistive device: Rolling walker (2 wheeled) Gait Pattern/deviations: Decreased step length - right;Decreased step length - left;Shuffle;Step-to pattern;Step-through pattern;Trunk flexed Gait velocity: decr Gait velocity interpretation: Below normal speed for age/gender General Gait Details: cues for posture, position from RW and initial sequence.  Physical assist to manage RW and to maintain balance  Stairs            Wheelchair Mobility    Modified Rankin (Stroke Patients Only)       Balance Overall balance assessment: Needs assistance Sitting-balance support: No upper extremity supported;Feet supported Sitting balance-Leahy Scale: Good     Standing balance support: Bilateral upper extremity supported Standing balance-Leahy Scale: Poor                               Pertinent Vitals/Pain Pain Assessment: Faces Faces Pain Scale: Hurts little more Pain Location: R ankle with WB and R axillary area with attempts to stand Pain Descriptors / Indicators: Aching;Dull;Sore Pain Intervention(s): Limited activity within patient's tolerance;Monitored during session    Home Living Family/patient expects to be discharged to:: Skilled nursing facility                      Prior Function Level of Independence: Independent         Comments: retired Tax inspector ed Best [employer]        Extremity/Trunk Assessment   Upper Extremity Assessment Upper Extremity Assessment: Generalized weakness    Lower Extremity Assessment Lower  Extremity Assessment: Generalized weakness       Communication   Communication: No difficulties  Cognition Arousal/Alertness: Awake/alert Behavior During Therapy: WFL for tasks assessed/performed Overall Cognitive Status: Within Functional Limits for tasks assessed Area of Impairment: Orientation;Safety/judgement;Problem solving                  Orientation Level: Place       Safety/Judgement: Decreased awareness of safety;Decreased awareness of deficits   Problem Solving: Slow processing;Difficulty sequencing        General Comments      Exercises     Assessment/Plan    PT Assessment Patient needs continued PT services  PT Problem List Decreased strength;Decreased cognition;Decreased knowledge of use of DME;Decreased activity tolerance;Decreased safety awareness;Decreased balance;Decreased knowledge of precautions;Decreased mobility;Pain       PT Treatment Interventions DME instruction;Gait training;Functional mobility training;Therapeutic activities;Therapeutic exercise;Patient/family education    PT Goals (Current goals can be found in the Care Plan section)  Acute Rehab PT Goals Patient Stated Goal: get my strength back PT Goal Formulation: With patient/family Time For Goal Achievement: 04/13/17 Potential to Achieve Goals: Fair    Frequency Min 3X/week   Barriers to discharge Decreased caregiver support lives alone`    Co-evaluation               AM-PAC PT "6 Clicks" Daily Activity  Outcome Measure Difficulty turning over in bed (including adjusting bedclothes, sheets and blankets)?: A Lot Difficulty moving from lying on back to sitting on the side of the bed? : Unable Difficulty sitting down on and standing up from a chair with arms (e.g., wheelchair, bedside commode, etc,.)?: Unable Help needed moving to and from a bed to chair (including a wheelchair)?: A Lot Help needed walking in hospital room?: A Little Help needed climbing 3-5 steps with a railing? : A Lot 6 Click Score: 11    End of Session Equipment Utilized During Treatment: Gait belt Activity Tolerance: Patient tolerated treatment well Patient left: in chair;with call bell/phone within reach;with family/visitor present Nurse Communication: Mobility status PT Visit Diagnosis: Unsteadiness on feet (R26.81)    Time:  4462-8638 PT Time Calculation (min) (ACUTE ONLY): 27 min   Charges:   PT Evaluation $PT Eval Low Complexity: 1 Low PT Treatments $Gait Training: 8-22 mins   PT G Codes:        Pg [phone]   [name] [name] 04/06/2017, 12:23 PM

## 2017-04-06 NOTE — Clinical Social Work Note (Signed)
Clinical Social Work Assessment  Patient Details  Name: [name] [name] MRN: [id number] Date of Birth: 02/11/1932  Date of referral:  04/06/17               Reason for consult:  Facility Placement                Permission sought to share information with:  Family Supports Permission granted to share information::  Yes, Verbal Permission Granted  Name::     daughter Karen  Agency::     Relationship::     Contact Information:     Housing/Transportation Living arrangements for the past 2 months:  Single Family Home Source of Information:  Patient, Adult Children, Medical Team Patient Interpreter Needed:  None Criminal Activity/Legal Involvement Pertinent to Current Situation/Hospitalization:  No - Comment as needed Significant Relationships:  Adult Children, Other Family Members, Friend Lives with:  Self Do you feel safe going back to the place where you live?  Yes Need for family participation in patient care:  Yes (Comment)(family involved in decision making)  Care giving concerns:  Pt from home- she was discharged home from [facility] 03/31/17, sustained fall.  She has Home Health and an aide 3days/week 3hours/day.   Social Worker assessment / plan:  CSW consulted to assist with SNF placement. Met with pt, daughter, son-in-law, and friend at bedside. Discussed PT recommendation for SNF given having recently gone home with home health and sustained fall, and pt agrees she is weaker than upon last hospitalization, agrees SNF for rehab would be beneficial.  CSW provided pt/family with SNF list for area, family to review and visit facilities. CSW obtained PASSR and completed FL2, made referrals to area facilities. WIll follow up with bed offers. (Family's preliminary preferences would be [facility], [facility], [facility]- pt would like private room if available)  Employment status:  Retired Insurance information:  Managed Medicare PT Recommendations:  Skilled Nursing  Facility Information / Referral to community resources:  Skilled Nursing Facility  Patient/Family's Response to care:  Appreciative and engaged in care decisions  Patient/Family's Understanding of and Emotional Response to Diagnosis, Current Treatment, and Prognosis:  Both pt and family demonstrate thorough understanding of treatment and care plan. Son-in-law pharmacist and daughter NP and both were helpful in explaining treatment to pt she explains. Also demonstrate teachback re: goals of going to rehab and pt states, "I need to be able to go back home safely and I can't till I am not so weak."  Emotional Assessment Appearance:  Appears stated age Attitude/Demeanor/Rapport:  (pleasant, engaged) Affect (typically observed):  Accepting, Calm Orientation:  Oriented to Self, Oriented to Place, Oriented to  Time, Oriented to Situation Alcohol / Substance use:  Not Applicable Psych involvement (Current and /or in the community):  No (Comment)  Discharge Needs  Concerns to be addressed:  Discharge Planning Concerns Readmission within the last 30 days:  Yes Current discharge risk:  Dependent with Mobility Barriers to Discharge:  No Barriers Identified    R , LCSW 04/06/2017, 10:26 AM  [phone]  

## 2017-04-06 NOTE — Progress Notes (Signed)
Pt's family visited SNFs and preference for 1) [facility] (no private rooms expected to be available in next few days) and 2) [facility] (can offer private room tomorrow 12/27). Family accepted bed offer at [facility]. CSW updated admissions. Plan for DC to [facility] if pt medically stable.  Sharren Bridge, MSW, LCSW Clinical Social Work 04/06/2017 [phone]

## 2017-04-07 NOTE — Telephone Encounter (Signed)
Dr. Yong Channel spoke with daughter [name]. Patient was evaluated by Dr. [name] here in the office and sent to hospital

## 2017-04-07 NOTE — Progress Notes (Signed)
PROGRESS NOTE    [name] [name]  PJA:[id number] DOB: [birth year] DOA: 04/04/2017 PCP: Marin Olp, MD   Brief Narrative: Patient is a 83 year old female with past medical history of recently diagnosed left renal mass status post cryoablation, atrial fibrillation, coronary artery disease status post CABG, systolic heart failure with ejection fraction of 25-30%, hypertension, memory loss, history of uterine cancer who presented to the emergency department with complaints of progressive generalized weakness, inability to ambulate and increasing confusion.  Patient was also found to be febrile in the emergency department.  Chest x-ray done on admission shows minimal left lower base opacification likely atelectasis.  Patient admitted for management of possible pneumonia and increased generalized weakness.    Assessment & Plan:   Principal Problem:   HCAP (healthcare-associated pneumonia) Active Problems:   Essential hypertension   Ischemic cardiomyopathy  EF 30% cath 4/14 with stent   Chronic combined systolic and diastolic congestive heart failure (HCC)   Paroxysmal atrial fibrillation ([place])   Pacemaker -CRT-St. Jude   Left renal mass   Acute blood loss as cause of postoperative anemia  Healthcare associated pneumonia: Patient was recently discharged on 12/20.  Chest x-ray done on admission shows minimal left base opacity likely atelectasis.  Patient was started on broad-spectrum antibiotics.  Pneumonia is questionable  at this point but will continue antibiotics for now.  Abx changed to  Azithro and Ceftrixone..Currently patient's respiratory status stable.  Currently saturating normally on room air.  Patient is not dyspneic or coughing during my evaluation . We will follow cultures.NGTD  Ischemic cardiomyopathy with ejection fraction of 30%: Follows with cardiology.  Last ejection fraction of 30%.  Status post CABG, stents. Cardiac status currently stable.  We will resume her home  medications.  Paroxysmal A. fib: On Eliquis at home.  Resumed here  Left renal mass:Recently diagnosed with mass of left lower kidney in 12/2016.  Did not have any metastatic disease on imaging and is status post cryoablation by radiology.  Post procedure complication with perinephric hematoma and anemia. CT imaging done here showed decreased size of hematoma on comparison to last imaging.  Patient follows with urologist Dr. Alinda Money  Hypertension: On lisinopril at home.  We will continue to monitor her blood pressure.  Postoperative anemia: Patient was noted to be anemic after cryoablation of the renal mass and postoperative complication with perinephric hematoma.  Currently H&H is stable and does not require transfusion.  We will continue to monitor.  Swelling/pain of the right ankle: Gout was suspected.  Uric acid is normal.  We will continue supportive care.  Generalized weakness: Consulted PT. recommended skilled nursing facility on discharge  Fall: Was complaining of pain in her right chest.  Chest x-ray ordered ,did not show any fractures of the ribs.  Patient is medically stable to be discharged to skilled nursing facility whenever the bed is available  DVT prophylaxis: Eliquis Code Status: Full Family Communication: None presented at the bedside Disposition Plan: SNF   Consultants: None  Procedures: None  Antimicrobials: On ceftriaxone and azithromycin since 12/25  Subjective: Patient seen and examined the patient this morning.  Remains comfortable.  Waiting to be discharged to skilled nursing facility.   Objective: Vitals:   04/06/17 0454 04/06/17 1446 04/06/17 2111 04/07/17 0502  BP: 126/68 137/60 (!) 146/87 (!) 159/72  Pulse: 86 82 90 89  Resp: 18 16 17 16   Temp: 98.3 F (36.8 C) 99.2 F (37.3 C) 98.8 F (37.1 C) 98.9 F (37.2 C)  TempSrc:  Oral Oral Oral Oral  SpO2: 98% 93% 95% 92%  Weight:      Height:        Intake/Output Summary (Last 24 hours) at  04/07/2017 1458 Last data filed at 04/06/2017 1833 Gross per 24 hour  Intake 120 ml  Output -  Net 120 ml   Filed Weights   04/04/17 1845 04/04/17 2217  Weight: 61.7 kg (136 lb) 65.1 kg (143 lb 8.3 oz)    Examination:  General exam: Appears calm and comfortable ,Not in distress,average built Respiratory system: Bilateral equal air entry, normal vesicular breath sounds, no wheezes or crackles  Cardiovascular system: S1 & S2 heard, RRR. No JVD, murmurs, rubs, gallops or clicks. No pedal edema. Gastrointestinal system: Abdomen is nondistended, soft and nontender. No organomegaly or masses felt. Normal bowel sounds heard. Central nervous system: Alert and oriented. No focal neurological deficits. Extremities: No edema, no clubbing ,no cyanosis, distal peripheral pulses palpable. Swelling of the left ankle Skin: No cyanosis,No pallor,No Rash,No Ulcer Psychiatry: Judgement and insight appear normal. Mood & affect appropriate.     Data Reviewed: I have personally reviewed following labs and imaging studies  CBC: Recent Labs  Lab 04/04/17 1213 04/04/17 1420 04/05/17 0401 04/05/17 0547  WBC 10.0 10.1 8.7 8.1  HGB 8.6* 8.8* 8.5* 8.2*  HCT 25.7* 27.1* 25.9* 24.5*  MCV 88.3 89.1 89.6 89.1  PLT 173 189 185 403   Basic Metabolic Panel: Recent Labs  Lab 04/04/17 1213 04/04/17 1420 04/05/17 0401 04/05/17 0547 04/06/17 0340  NA 134* 134* 135 134* 134*  K 4.2 3.4* 3.2* 3.4* 3.5  CL 98 98* 101 100* 101  CO2 27 29 25 26 26   GLUCOSE 110* 113* 106* 109* 116*  BUN 14 14 12 12 11   CREATININE 0.69 0.68 0.69 0.66 0.61  CALCIUM 8.1* 8.2* 7.9* 7.7* 8.0*   GFR: Estimated Creatinine Clearance: 53.7 mL/min (by C-G formula based on SCr of 0.61 mg/dL). Liver Function Tests: No results for input(s): AST, ALT, ALKPHOS, BILITOT, PROT, ALBUMIN in the last 168 hours. No results for input(s): LIPASE, AMYLASE in the last 168 hours. No results for input(s): AMMONIA in the last 168  hours. Coagulation Profile: No results for input(s): INR, PROTIME in the last 168 hours. Cardiac Enzymes: No results for input(s): CKTOTAL, CKMB, CKMBINDEX, TROPONINI in the last 168 hours. BNP (last 3 results) No results for input(s): PROBNP in the last 8760 hours. HbA1C: No results for input(s): HGBA1C in the last 72 hours. CBG: Recent Labs  Lab 04/04/17 1626  GLUCAP 111*   Lipid Profile: No results for input(s): CHOL, HDL, LDLCALC, TRIG, CHOLHDL, LDLDIRECT in the last 72 hours. Thyroid Function Tests: No results for input(s): TSH, T4TOTAL, FREET4, T3FREE, THYROIDAB in the last 72 hours. Anemia Panel: No results for input(s): VITAMINB12, FOLATE, FERRITIN, TIBC, IRON, RETICCTPCT in the last 72 hours. Sepsis Labs: Recent Labs  Lab 04/04/17 1839  LATICACIDVEN 0.88    Recent Results (from the past 240 hour(s))  Culture, blood (routine x 2) Call MD if unable to obtain prior to antibiotics being given     Status: None (Preliminary result)   Collection Time: 04/04/17  8:23 PM  Result Value Ref Range Status   Specimen Description BLOOD LEFT HAND  Final   Special Requests   Final    BOTTLES DRAWN AEROBIC AND ANAEROBIC Blood Culture adequate volume   Culture   Final    NO GROWTH 3 DAYS Performed at [facility], [address] [place], [place] [address]    Report Status PENDING  Incomplete  Culture, blood (routine x 2) Call MD if unable to obtain prior to antibiotics being given     Status: None (Preliminary result)   Collection Time: 04/04/17  8:23 PM  Result Value Ref Range Status   Specimen Description BLOOD RIGHT FOREARM  Final   Special Requests IN PEDIATRIC BOTTLE Blood Culture adequate volume  Final   Culture   Final    NO GROWTH 3 DAYS Performed at [facility], [place] [address], [place], [place] [address]    Report Status PENDING  Incomplete  Culture, Urine     Status: None   Collection Time: 04/04/17  8:36 PM  Result Value Ref Range Status   Specimen  Description URINE, RANDOM  Final   Special Requests NONE  Final   Culture   Final    NO GROWTH Performed at [facility], [place] [address], [place],  [address]    Report Status 04/06/2017 FINAL  Final         Radiology Studies: Dg Chest 1 View  Result Date: 04/06/2017 CLINICAL DATA:  Epigastric pain, weakness EXAM: CHEST 1 VIEW COMPARISON:  04/04/2017 FINDINGS: There is no focal consolidation. There is mild left basilar scarring. There is no pleural effusion or pneumothorax. There is evidence of prior CABG. There is a dual lead cardiac pacemaker. The osseous structures are unremarkable. IMPRESSION: No active disease. Electronically Signed   By: Kathreen Devoid   On: 04/06/2017 12:19        Scheduled Meds: . apixaban  5 mg Oral BID  . azithromycin  500 mg Oral Daily  . escitalopram  20 mg Oral QHS  . lisinopril  2.5 mg Oral QHS  . mirabegron ER  25 mg Oral Daily  . pyridostigmine  60 mg Oral BID   Continuous Infusions: . cefTRIAXone (ROCEPHIN)  IV 1 g (04/07/17 1454)     LOS: 3 days    Time spent: 25 minutes    Johnie Stadel Jodie Echevaria, MD [facility] Pager [phone]  If 7PM-7AM, please contact night-coverage [email or web address] Password [facility] 04/07/2017, 2:58 PM

## 2017-04-08 LAB — CK: CK TOTAL: 21 U/L — AB (ref 38–234)

## 2017-04-08 MED ORDER — HYDROCODONE-ACETAMINOPHEN 5-325 MG PO TABS: 1.0000 | ORAL_TABLET | Freq: Four times a day (QID) | ORAL | 0 refills | Status: DC | PRN

## 2017-04-08 MED ORDER — LEVOFLOXACIN 750 MG PO TABS: 750.0000 mg | ORAL_TABLET | Freq: Every day | ORAL | 0 refills | Status: DC

## 2017-04-08 MED ORDER — LEVOFLOXACIN 750 MG PO TABS: 750.0000 mg | ORAL_TABLET | Freq: Every day | ORAL | Status: DC

## 2017-04-08 NOTE — Care Management Important Message (Signed)
Important Message  Patient Details  Name: [name] [name] MRN: [id number] Date of Birth: 1931-05-08   Medicare Important Message Given:  Yes    Kerin Salen 04/08/2017, 11:28 AMImportant Message  Patient Details  Name: [name] [name] MRN: [id number] Date of Birth: May 29, 1931   Medicare Important Message Given:  Yes    Kerin Salen 04/08/2017, 11:28 AM

## 2017-04-08 NOTE — Clinical Social Work Placement (Signed)
    10:29 AM Patient and family chose bed at [facility].  LCSW confirmed bed with facility.  LCSW faxed DC docs to facility via hub.  Family will transport family.  RN report number: [phone]  CLINICAL SOCIAL WORK PLACEMENT  NOTE  Date:  04/08/2017  Patient Details  Name: [name] [name] MRN: [id number] Date of Birth: 05/03/1931  Clinical Social Work is seeking post-discharge placement for this patient at the [place] level of care (*CSW will initial, date and re-position this form in  chart as items are completed):  Yes   Patient/family provided with [facility] Work Department's list of facilities offering this level of care within the geographic area requested by the patient (or if unable, by the patient's family).  Yes   Patient/family informed of their freedom to choose among providers that offer the needed level of care, that participate in Medicare, Medicaid or managed care program needed by the patient, have an available bed and are willing to accept the patient.  Yes   Patient/family informed of [facility]'s ownership interest in [facility] and [facility], as well as of the fact that they are under no obligation to receive care at these facilities.  PASRR submitted to EDS on 04/06/17     PASRR number received on 04/06/17     Existing PASRR number confirmed on       FL2 transmitted to all facilities in geographic area requested by pt/family on 04/06/17     FL2 transmitted to all facilities within larger geographic area on       Patient informed that his/her managed care company has contracts with or will negotiate with certain facilities, including the following:        Yes   Patient/family informed of bed offers received.  Patient chooses bed at [facility]     Physician recommends and patient chooses bed at [facility]    Patient to be transferred to [facility] on 04/07/17.  Patient to be transferred to facility  by [facility]     Patient family notified on   of transfer.  Name of family member notified:  son-in-law Ulice Dash, daughter Santiago Glad     PHYSICIAN       Additional Comment:    _______________________________________________ Servando Snare, LCSW 04/08/2017, 10:29 AM

## 2017-04-08 NOTE — Progress Notes (Signed)
Date: April 08, 2017 Chart review for discharge needs:  None found for case management. Patient has no questions concerning post hospital care.

## 2017-04-08 NOTE — Discharge Summary (Signed)
Physician Discharge Summary  [name] [name] ZSW:[id number] DOB: 22-Aug-1931 DOA: 04/04/2017  PCP: Marin Olp, MD  Admit date: 04/04/2017 Discharge date: 04/08/2017  Admitted From: Home Disposition:  Home  Recommendations for Outpatient Follow-up:  1. Follow up with PCP in 1-2 weeks 2. Please obtain BMP/CBC in one week   Home Health: No Discharge Condition : Stable CODE STATUS: Full Diet recommendation: Heart Healthy  Brief/Interim Summary:  Patient is a 83 year old female with past medical history of recently diagnosed left renal mass status post cryoablation, atrial fibrillation, coronary artery disease status post CABG, systolic heart failure with ejection fraction of 25-30%, hypertension, memory loss, history of uterine cancer who presented to the emergency department with complaints of progressive generalized weakness, inability to ambulate and increasing confusion.  Patient was also found to be febrile in the emergency department.  Chest x-ray done on admission shows minimal left lower base opacification likely atelectasis.  Patient admitted for management of possible pneumonia and increased generalized weakness. Patient was started on IV antibiotics on admission.  Her overall clinical status continued to improve.  She remained afebrile.  Her blood cultures remain negative.  She was evaluated physical therapy and recommended to be discharged to skilled nursing facility. Patient will be discharged today to skilled nursing facility.  She will continue the antibiotics to finish the course.  Following problems were addressed during hospitalization:  Healthcare associated pneumonia: Patient was recently discharged on 12/20.  Chest x-ray done on admission shows minimal left base opacity likely atelectasis.  Patient was started on broad-spectrum antibiotics.  Pneumonia is questionable  at this point but will continue antibiotics for now.  Abx changed to Levaquin on discharge.  Currently patient's respiratory status stable.  Currently saturating normally on room air.  Patient is not dyspneic or coughing during my evaluation .  Ischemic cardiomyopathy with ejection fraction of 30%: Follows with cardiology.  Last ejection fraction of 30%.  Status post CABG, stents. Cardiac status currently stable.  We will resume her home medications.  Paroxysmal A. fib: On Eliquis at home.  Resumed here  Left renal mass:Recently diagnosed with mass of left lower kidney in 12/2016.  Did not have any metastatic disease on imaging and is status post cryoablation by radiology.  Post procedure complication with perinephric hematoma and anemia. CT imaging done here showed decreased size of hematoma on comparison to last imaging. Patient follows with urologist Dr. Alinda Money  Hypertension: On lisinopril at home.  Blood pressure stable.  Postoperative anemia: Patient was noted to be anemic after cryoablation of the renal mass and postoperative complication with perinephric hematoma.  Currently H&H is stable and does not require transfusion.   Swelling/pain of the right ankle: Gout was suspected.  Uric acid is normal.  We will continue supportive care.  Generalized weakness: Consulted PT. Recommended skilled nursing facility on discharge  Fall: Was complaining of pain in her right chest.  Chest x-ray ordered ,did not show any fractures of the ribs.  Patient is medically stable to be discharged to skilled nursing facility .      Discharge Diagnoses:  Principal Problem:   HCAP (healthcare-associated pneumonia) Active Problems:   Essential hypertension   Ischemic cardiomyopathy  EF 30% cath 4/14 with stent   Chronic combined systolic and diastolic congestive heart failure (HCC)   Paroxysmal atrial fibrillation ([place])   Pacemaker -CRT-[employer]   Left renal mass   Acute blood loss as cause of postoperative anemia    Discharge Instructions  Discharge Instructions  Diet - low  sodium heart healthy   Complete by:  As directed    Discharge instructions   Complete by:  As directed    1) Take prescribed medications as instructed. 2) Follow up with your PCP in a week.   Increase activity slowly   Complete by:  As directed      Allergies as of 04/08/2017      Reactions   Aricept [donepezil] Shortness Of Breath, Other (See Comments)   Hallucinations and loss of sensation in legs   Nitroglycerin Other (See Comments)   Oral spray form causes rapid drop in blood pressure.        Medication List    TAKE these medications   ELIQUIS 5 MG Tabs tablet Generic drug:  apixaban Take 5 mg by mouth daily.   escitalopram 20 MG tablet Commonly known as:  LEXAPRO TAKE 1 TABLET(20 MG) BY MOUTH DAILY What changed:  See the new instructions.   HYDROcodone-acetaminophen 5-325 MG tablet Commonly known as:  NORCO/VICODIN Take 1-2 tablets by mouth every 6 (six) hours as needed for moderate pain.   levofloxacin 750 MG tablet Commonly known as:  LEVAQUIN Take 1 tablet (750 mg total) by mouth daily.   lisinopril 2.5 MG tablet Commonly known as:  PRINIVIL,ZESTRIL TAKE 1 TABLET(2.5 MG) BY MOUTH DAILY What changed:  See the new instructions.   mirabegron ER 25 MG Tb24 tablet Commonly known as:  MYRBETRIQ Take 1 tablet (25 mg total) by mouth daily.   pyridostigmine 60 MG tablet Commonly known as:  MESTINON TAKE 1 TABLET(60 MG) BY MOUTH TWICE DAILY       Contact information for follow-up providers    Marin Olp, MD. Schedule an appointment as soon as possible for a visit in 1 week(s).   Specialty:  Family Medicine Contact information: [address] [place] [address] [phone]            Contact information for after-discharge care    Destination    [facility] Follow up.   Service:  Skilled Nursing Contact information: [place] [address] [phone]                 Allergies   Allergen Reactions  . Aricept [Donepezil] Shortness Of Breath and Other (See Comments)    Hallucinations and loss of sensation in legs  . Nitroglycerin Other (See Comments)    Oral spray form causes rapid drop in blood pressure.      Consultations:  None   Procedures/Studies: Ct Abdomen Pelvis Wo Contrast  Result Date: 03/29/2017 CLINICAL DATA:  Status post left renal cryo ablation for mass yesterday. Drop in hemoglobin. EXAM: CT ABDOMEN AND PELVIS WITHOUT CONTRAST TECHNIQUE: Multidetector CT imaging of the abdomen and pelvis was performed following the standard protocol without IV contrast. COMPARISON:  Abdominal CT 01/11/2017 and abdominopelvic CT 12/30/2016. Images from biopsy 03/28/2017. FINDINGS: Lower chest: New small left-greater-than-right pleural effusions with mild dependent left lower lobe atelectasis. The heart is enlarged status post median sternotomy and pacemaker placement. Hepatobiliary: No ectatic abnormalities are identified on noncontrast imaging. Vicarious excretion of contrast is demonstrated within the gallbladder lumen. No evidence of gallbladder wall thickening or biliary dilatation. Pancreas: Atrophied without evidence of acute hemorrhage, ductal dilatation or surrounding inflammation. Spleen: Normal in size without focal abnormality. Adrenals/Urinary Tract: Both adrenal glands appear normal. The right kidney appears unremarkable. Subcapsular and perinephric hematoma on the left has progressed from yesterday. There is a subcapsular component  measuring up to 1.8 cm in thickness. Perinephric components extend into the left posterior pararenal space and inferiorly into the pelvis in the posterior perirectal space. There is a persistent nephrogram on the left which is disrupted by the underlying known mass in the lower pole. No active bleeding evident. There is no hydronephrosis. Contrast material is present in the urinary bladder. Stomach/Bowel: No evidence of bowel wall  thickening, distention or surrounding inflammatory change. Vascular/Lymphatic: There are no enlarged abdominal or pelvic lymph nodes. Aortic and branch vessel atherosclerosis. No acute vascular findings on noncontrast imaging. Reproductive: Hysterectomy. No evidence of adnexal mass. There is a small amount of free pelvic fluid. No generalized ascites or free air. Other: As above, inferior extension of perinephric hematoma into the perirectal fat. Small amount of free pelvic fluid. Musculoskeletal: No acute or significant osseous findings. IMPRESSION: 1. Progression of the known left renal subcapsular and perinephric hematoma since cryoablation procedure yesterday. Hemorrhage extends into the perirectal fat, and there is a small amount of free pelvic fluid. 2. Associated persistent nephrogram on the left.  No hydronephrosis. 3. Small bilateral pleural effusions with left lower lobe atelectasis. 4.  Aortic Atherosclerosis (ICD10-I70.0). 5. These results were called by telephone at the time of interpretation on 03/29/2017 at 5:00 pm to [facility] , who verbally acknowledged these results. Electronically Signed   By: Richardean Sale M.D.   On: 03/29/2017 17:13   Dg Chest 1 View  Result Date: 04/06/2017 CLINICAL DATA:  Epigastric pain, weakness EXAM: CHEST 1 VIEW COMPARISON:  04/04/2017 FINDINGS: There is no focal consolidation. There is mild left basilar scarring. There is no pleural effusion or pneumothorax. There is evidence of prior CABG. There is a dual lead cardiac pacemaker. The osseous structures are unremarkable. IMPRESSION: No active disease. Electronically Signed   By: Kathreen Devoid   On: 04/06/2017 12:19   Dg Chest 1 View  Result Date: 03/22/2017 CLINICAL DATA:  Preoperative study.  Renal cryoablation . EXAM: CHEST 1 VIEW COMPARISON:  Chest x-ray 08/17/2013. FINDINGS: Cardiac pace scratched it AICD noted with lead tips in stable position. Prior CABG. Stable cardiomegaly. No pulmonary venous  congestion. No focal infiltrate. Mild stable basal pleural thickening noted consistent scarring. Mild thoracolumbar spine scoliosis. Mild thoracic spine scoliosis. IMPRESSION: No acute cardiopulmonary disease. AICD in stable position. Prior CABG. Heart size stable. Electronically Signed   By: Marcello Moores  Register   On: 03/22/2017 12:58   Dg Chest 2 View  Result Date: 04/04/2017 CLINICAL DATA:  Recent renal cryoablation with perinephric hematoma. Possible atelectasis. EXAM: CHEST  2 VIEW COMPARISON:  03/22/2017 FINDINGS: Sternotomy wires and left-sided pacemaker unchanged. Lungs are adequately inflated with mild left basilar opacification likely atelectasis and possible small amount of pleural fluid. Mild cardiomegaly. Remainder of the exam is unchanged. IMPRESSION: Minimal left base opacification likely atelectasis with small amount of pleural fluid. Electronically Signed   By: Marin Olp M.D.   On: 04/04/2017 17:25   Dg Ankle Complete Right  Result Date: 04/04/2017 CLINICAL DATA:  Pain and swelling in the right ankle for 1 day with bruising along the medial malleolus. EXAM: RIGHT ANKLE - COMPLETE 3+ VIEW COMPARISON:  None. FINDINGS: Abnormal soft tissue swelling overlying both the medial and lateral malleolus. Bony demineralization. Plafond and talar dome appear intact. No well-defined fracture. Plantar calcaneal spur with mild calcification in the plantar fascia and distal Achilles tendon. Atherosclerotic vascular calcifications noted. IMPRESSION: 1. Abnormal soft tissue swelling overlying both malleoli and along the heel, cause uncertain. 2. Atherosclerosis.  3. Plantar calcaneal spur.  Faint chondrocalcinosis. 4. Bony demineralization. Electronically Signed   By: Van Clines M.D.   On: 04/04/2017 14:39   Ct Abdomen Pelvis W Contrast  Result Date: 04/04/2017 CLINICAL DATA:  83 year old female status post recent biopsy and cryoablation of the left renal mass complicated by hemorrhage. Continued  flank pain. EXAM: CT ABDOMEN AND PELVIS WITH CONTRAST TECHNIQUE: Multidetector CT imaging of the abdomen and pelvis was performed using the standard protocol following bolus administration of intravenous contrast. CONTRAST:  126mL ISOVUE-300 IOPAMIDOL (ISOVUE-300) INJECTION 61% COMPARISON:  Multiple prior abdominal CTs dating back to 12/30/2016 FINDINGS: Lower chest: Partially visualized small bilateral pleural effusions with associated subsegmental atelectatic changes of the left lung base similar to the prior CT of 03/29/2017. There is mild cardiomegaly. Cardiac pacemaker wires and CABG clips noted. No intraperitoneal free air or free fluid. Hepatobiliary: The liver is unremarkable. No intrahepatic biliary ductal dilatation. The gallbladder is partially contracted and appears unremarkable. Pancreas: Unremarkable. No pancreatic ductal dilatation or surrounding inflammatory changes. Spleen: Normal in size without focal abnormality. Adrenals/Urinary Tract: The adrenal glands are unremarkable. Status post cryoablation of left renal mid to lower pole mass with associated infarction. There is enhancement of the remainder of the left renal parenchyma. Smaller hypodense upper pole lesions are not well characterized but likely represent cysts. There is overall interval decrease in the size of the left perinephric hematoma compared to the study of 03/29/2017. No extravasated contrast noted to suggest active bleed. Small pockets of air again noted along the posterolateral aspect of the left kidney in the perirenal space as seen on the prior CT. There is decreased or interval resolution of the blood seen in the posterior pelvis on the prior CT. The right kidney is unremarkable. There is no hydronephrosis on either side. There is symmetric enhancement and excretion of contrast by both kidneys. The visualized ureters and urinary bladder appear unremarkable. Stomach/Bowel: There is no bowel obstruction or active inflammation.  The appendix appears unremarkable as visualized. Vascular/Lymphatic: There is moderate aortoiliac atherosclerotic disease. The origins of the celiac axis, SMA, IMA are patent. The SMV, splenic vein, and main portal vein are patent. B renal artery stent veins appear patent. There is no adenopathy. Bilateral iliac chain surgical staples noted. Reproductive: Hysterectomy.  No pelvic mass. Other: None Musculoskeletal: Degenerative changes of the spine and hips. No acute osseous pathology. IMPRESSION: 1. Cryoablation with infarcted appearance of the left renal mass. The remainder of the left renal parenchyma demonstrate normal enhancement and excretion. There has been interval decrease in the size of the left perinephric hematoma compared to the CT of 03/29/2017. No evidence of active bleed. 2. Partially visualized small bilateral pleural effusions as seen on the prior CT. 3.  Aortic Atherosclerosis (ICD10-I70.0). Electronically Signed   By: Anner Crete M.D.   On: 04/04/2017 18:42   Ct Guide Tissue Ablation  Result Date: 03/28/2017 CLINICAL DATA:  3.4 cm mass of the left kidney with imaging characteristics consistent with renal carcinoma. The patient presents for biopsy and ablation under CT guidance. EXAM: CT-GUIDED CORE BIOPSY OF LEFT RENAL MASS CT-GUIDED PERCUTANEOUS CRYOABLATION OF LEFT RENAL MASS ANESTHESIA/SEDATION: General MEDICATIONS: 2 g IV Ancef. The antibiotic was administered in an appropriate time interval prior to needle puncture of the skin. CONTRAST:  75mL ISOVUE-370 IOPAMIDOL (ISOVUE-370) INJECTION 76% PROCEDURE: The procedure, risks, benefits, and alternatives were explained to the patient. Questions regarding the procedure were encouraged and answered. The patient understands and consents to the procedure. A time-out  was performed prior to initiating the procedure. The patient was placed under general anesthesia. Initial unenhanced CT was performed in a prone position to localize the left  kidney. The patient was prepped with chlorhexidine in a sterile fashion, and a sterile drape was applied covering the operative field. A sterile gown and sterile gloves were used for the procedure. Contrast enhanced CT was performed of the kidneys with administration of IV contrast. A 17 gauge trocar needle was advanced into the left renal mass. Core biopsy was performed with an 18 gauge automated core biopsy device. A single core biopsy sample was submitted in formalin for pathologic analysis. After biopsy, a slurry of Gel-Foam pledgets mixed in sterile saline was injected through the 17 gauge needle as it was removed. Under CT guidance, a series of 3 separate [employer] CX percutaneous cryoablation probes were advanced into the left renal mass. Probe positioning was confirmed by CT prior to cryoablation. Cryoablation was performed through the 3 probes simultaneously. Initial 10 minute cycle of cryoablation was performed. This was followed by a 8 minute thaw cycle. A second 10 minute cycle of cryoablation was then performed. During ablation, periodic CT imaging was performed to monitor ice ball formation and morphology. After active thaw, a 30 second cautery cycle was performed and the cryoablation probes were then removed. COMPLICATIONS: Perinephric hemorrhage and hematuria. SIR level A: No therapy, no consequence. FINDINGS: Posterior mid to lower left renal mass was localized and after administration of contrast demonstrates avid enhancement. The mass measures approximately 3 x 3.5 cm in greatest transverse dimensions. Core biopsy yielded solid tissue. During biopsy and cryoablation probe placement, there was some perinephric bleeding present posterior to the kidney. Once ablation was begun, the amount of bleeding did not appear to increase significantly. During ablation, CT demonstrates adequate ice ball formation that appears to encompass the margins of the tumor. During the second freeze, it did become  evident that there was development of some hematuria in the Foley catheter drainage bag. This will be followed. IMPRESSION: CT guided core biopsy and cryoablation of 3.5 cm left renal mass. The procedure was complicated by posterior perinephric hemorrhage and hematuria. The patient will be observed overnight. Initial follow-up will be performed in approximately 4 weeks. Electronically Signed   By: Aletta Edouard M.D.   On: 03/28/2017 11:53   Ct Biopsy  Result Date: 03/31/2017 CLINICAL DATA:  3.4 cm mass of the left kidney with imaging characteristics consistent with renal carcinoma. The patient presents for biopsy and ablation under CT guidance. EXAM: CT-GUIDED CORE BIOPSY OF LEFT RENAL MASS CT-GUIDED PERCUTANEOUS CRYOABLATION OF LEFT RENAL MASS ANESTHESIA/SEDATION: General MEDICATIONS: 2 g IV Ancef. The antibiotic was administered in an appropriate time interval prior to needle puncture of the skin. CONTRAST:  70mL ISOVUE-370 IOPAMIDOL (ISOVUE-370) INJECTION 76% PROCEDURE: The procedure, risks, benefits, and alternatives were explained to the patient. Questions regarding the procedure were encouraged and answered. The patient understands and consents to the procedure. A time-out was performed prior to initiating the procedure. The patient was placed under general anesthesia. Initial unenhanced CT was performed in a prone position to localize the left kidney. The patient was prepped with chlorhexidine in a sterile fashion, and a sterile drape was applied covering the operative field. A sterile gown and sterile gloves were used for the procedure. Contrast enhanced CT was performed of the kidneys with administration of IV contrast. A 17 gauge trocar needle was advanced into the left renal mass. Core biopsy was performed with  an 18 gauge automated core biopsy device. A single core biopsy sample was submitted in formalin for pathologic analysis. After biopsy, a slurry of Gel-Foam pledgets mixed in sterile saline  was injected through the 17 gauge needle as it was removed. Under CT guidance, a series of 3 separate [employer] CX percutaneous cryoablation probes were advanced into the left renal mass. Probe positioning was confirmed by CT prior to cryoablation. Cryoablation was performed through the 3 probes simultaneously. Initial 10 minute cycle of cryoablation was performed. This was followed by a 8 minute thaw cycle. A second 10 minute cycle of cryoablation was then performed. During ablation, periodic CT imaging was performed to monitor ice ball formation and morphology. After active thaw, a 30 second cautery cycle was performed and the cryoablation probes were then removed. COMPLICATIONS: Perinephric hemorrhage and hematuria. SIR level A: No therapy, no consequence. FINDINGS: Posterior mid to lower left renal mass was localized and after administration of contrast demonstrates avid enhancement. The mass measures approximately 3 x 3.5 cm in greatest transverse dimensions. Core biopsy yielded solid tissue. During biopsy and cryoablation probe placement, there was some perinephric bleeding present posterior to the kidney. Once ablation was begun, the amount of bleeding did not appear to increase significantly. During ablation, CT demonstrates adequate ice ball formation that appears to encompass the margins of the tumor. During the second freeze, it did become evident that there was development of some hematuria in the Foley catheter drainage bag. This will be followed. IMPRESSION: CT guided core biopsy and cryoablation of 3.5 cm left renal mass. The procedure was complicated by posterior perinephric hemorrhage and hematuria. The patient will be observed overnight. Initial follow-up will be performed in approximately 4 weeks. Electronically Signed   By: Aletta Edouard M.D.   On: 03/28/2017 11:53       Subjective:  Patient seen and examined the bedside this morning.  Remains comfortable.  Denies any complaints.   No new issues/events.  She will be discharged to skilled nursing facility today.   Discharge Exam: Vitals:   04/07/17 2056 04/08/17 0453  BP: 128/61 (!) 138/58  Pulse: 81 82  Resp: 18 18  Temp: 98.6 F (37 C) 99.3 F (37.4 C)  SpO2: 93% 91%   Vitals:   04/07/17 0502 04/07/17 1505 04/07/17 2056 04/08/17 0453  BP: (!) 159/72 (!) 135/58 128/61 (!) 138/58  Pulse: 89 83 81 82  Resp: 16 17 18 18   Temp: 98.9 F (37.2 C) 99.2 F (37.3 C) 98.6 F (37 C) 99.3 F (37.4 C)  TempSrc: Oral Oral Oral Oral  SpO2: 92% 94% 93% 91%  Weight:      Height:        General: Pt is alert, awake, not in acute distress Cardiovascular: RRR, S1/S2 +, no rubs, no gallops Respiratory: CTA bilaterally, no wheezing, no rhonchi Abdominal: Soft, NT, ND, bowel sounds + Extremities: no edema, no cyanosis    The results of significant diagnostics from this hospitalization (including imaging, microbiology, ancillary and laboratory) are listed below for reference.     Microbiology: Recent Results (from the past 240 hour(s))  Culture, blood (routine x 2) Call MD if unable to obtain prior to antibiotics being given     Status: None (Preliminary result)   Collection Time: 04/04/17  8:23 PM  Result Value Ref Range Status   Specimen Description BLOOD LEFT HAND  Final   Special Requests   Final    BOTTLES DRAWN AEROBIC AND ANAEROBIC Blood Culture  adequate volume   Culture   Final    NO GROWTH 3 DAYS Performed at [facility], [place] [address], [place], [place] [address]    Report Status PENDING  Incomplete  Culture, blood (routine x 2) Call MD if unable to obtain prior to antibiotics being given     Status: None (Preliminary result)   Collection Time: 04/04/17  8:23 PM  Result Value Ref Range Status   Specimen Description BLOOD RIGHT FOREARM  Final   Special Requests IN PEDIATRIC BOTTLE Blood Culture adequate volume  Final   Culture   Final    NO GROWTH 3 DAYS Performed at [facility],  [place] [address] [place], [place] [address]    Report Status PENDING  Incomplete  Culture, Urine     Status: None   Collection Time: 04/04/17  8:36 PM  Result Value Ref Range Status   Specimen Description URINE, RANDOM  Final   Special Requests NONE  Final   Culture   Final    NO GROWTH Performed at [facility], [place] [address] [place], [place] [address]    Report Status 04/06/2017 FINAL  Final     Labs: BNP (last 3 results) No results for input(s): BNP in the last 8760 hours. Basic Metabolic Panel: Recent Labs  Lab 04/04/17 1213 04/04/17 1420 04/05/17 0401 04/05/17 0547 04/06/17 0340  NA 134* 134* 135 134* 134*  K 4.2 3.4* 3.2* 3.4* 3.5  CL 98 98* 101 100* 101  CO2 27 29 25 26 26   GLUCOSE 110* 113* 106* 109* 116*  BUN 14 14 12 12 11   CREATININE 0.69 0.68 0.69 0.66 0.61  CALCIUM 8.1* 8.2* 7.9* 7.7* 8.0*   Liver Function Tests: No results for input(s): AST, ALT, ALKPHOS, BILITOT, PROT, ALBUMIN in the last 168 hours. No results for input(s): LIPASE, AMYLASE in the last 168 hours. No results for input(s): AMMONIA in the last 168 hours. CBC: Recent Labs  Lab 04/04/17 1213 04/04/17 1420 04/05/17 0401 04/05/17 0547  WBC 10.0 10.1 8.7 8.1  HGB 8.6* 8.8* 8.5* 8.2*  HCT 25.7* 27.1* 25.9* 24.5*  MCV 88.3 89.1 89.6 89.1  PLT 173 189 185 178   Cardiac Enzymes: Recent Labs  Lab 04/08/17 1135  CKTOTAL 21*   BNP: Invalid input(s): POCBNP CBG: Recent Labs  Lab 04/04/17 1626  GLUCAP 111*   D-Dimer No results for input(s): DDIMER in the last 72 hours. Hgb A1c No results for input(s): HGBA1C in the last 72 hours. Lipid Profile No results for input(s): CHOL, HDL, LDLCALC, TRIG, CHOLHDL, LDLDIRECT in the last 72 hours. Thyroid function studies No results for input(s): TSH, T4TOTAL, T3FREE, THYROIDAB in the last 72 hours.  Invalid input(s): FREET3 Anemia work up No results for input(s): VITAMINB12, FOLATE, FERRITIN, TIBC, IRON, RETICCTPCT in the last 72  hours. Urinalysis    Component Value Date/Time   COLORURINE AMBER (A) 04/04/2017 1713   APPEARANCEUR CLEAR 04/04/2017 1713   LABSPEC 1.021 04/04/2017 1713   PHURINE 6.0 04/04/2017 1713   GLUCOSEU NEGATIVE 04/04/2017 1713   GLUCOSEU NEGATIVE 12/29/2016 1558   HGBUR LARGE (A) 04/04/2017 1713   BILIRUBINUR NEGATIVE 04/04/2017 1713   BILIRUBINUR negative 04/21/2016 1157   KETONESUR 5 (A) 04/04/2017 1713   PROTEINUR 100 (A) 04/04/2017 1713   UROBILINOGEN 0.2 12/29/2016 1558   NITRITE NEGATIVE 04/04/2017 1713   LEUKOCYTESUR TRACE (A) 04/04/2017 1713   Sepsis Labs Invalid input(s): PROCALCITONIN,  WBC,  LACTICIDVEN Microbiology Recent Results (from  the past 240 hour(s))  Culture, blood (routine x 2) Call MD if unable to obtain prior to antibiotics being given     Status: None (Preliminary result)   Collection Time: 04/04/17  8:23 PM  Result Value Ref Range Status   Specimen Description BLOOD LEFT HAND  Final   Special Requests   Final    BOTTLES DRAWN AEROBIC AND ANAEROBIC Blood Culture adequate volume   Culture   Final    NO GROWTH 3 DAYS Performed at [facility], [place] [address], [place], [place] [address]    Report Status PENDING  Incomplete  Culture, blood (routine x 2) Call MD if unable to obtain prior to antibiotics being given     Status: None (Preliminary result)   Collection Time: 04/04/17  8:23 PM  Result Value Ref Range Status   Specimen Description BLOOD RIGHT FOREARM  Final   Special Requests IN PEDIATRIC BOTTLE Blood Culture adequate volume  Final   Culture   Final    NO GROWTH 3 DAYS Performed at [facility], [place] [address] [place], [place] [address]    Report Status PENDING  Incomplete  Culture, Urine     Status: None   Collection Time: 04/04/17  8:36 PM  Result Value Ref Range Status   Specimen Description URINE, RANDOM  Final   Special Requests NONE  Final   Culture   Final    NO GROWTH Performed at [facility], [place] [address]  [place], [place] [address]    Report Status 04/06/2017 FINAL  Final     Time coordinating discharge: Over 30 minutes  SIGNED:   Marene Lenz, MD  [facility] 04/08/2017, 12:33 PM Pager [phone]  If 7PM-7AM, please contact night-coverage [email or web address] Password [email or web address]

## 2017-04-09 LAB — CULTURE, BLOOD (ROUTINE X 2)
CULTURE: NO GROWTH
Culture: NO GROWTH
SPECIAL REQUESTS: ADEQUATE
Special Requests: ADEQUATE

## 2017-04-10 NOTE — Progress Notes (Deleted)
Cardiology Office Note Date:  04/10/2017  Patient ID:  [name], [name] 18-Sep-1931, MRN [id number] PCP:  Marin Olp, MD  Cardiologist:  Dr. Caryl Comes  ***refresh   Chief Complaint: post hospital f/u  History of Present Illness: [name] [name] is a 83 y.o. female with history of high degree AVblock w/CRT-P, ICM, CAD w/CABG, PAFib, HTN, HLD, this year found with renal Ca underwent cryoablation in Dec complicated by perinephrotic hematoma discharged 03/31/17 off a/c.  Readmitted to [facility] 04/04/17 with generalized weakness and confusion dx with HCAP, febrile, hematoma was decreased in size, H/H was stable, her Eliquis was resumed, discharged 04/08/17 to SNF for rehab.  She comes in today to be see for Dr. Caryl Comes.  *** meds, CAD/CM *** lipids/labs *** bleeding, eliquis *** fluid status *** notes report pt feels poorly w/Vsense testig *** PMD, neprology f/u   Device information: SJM CRT-P, implanted 02/02/13  Past Medical History:  Diagnosis Date  . Atrial fibrillation ([place]) 10/06/2007   post op  . COPD (chronic obstructive pulmonary disease) ([place])    Emphysema on 02/03/13 CXR  . CORONARY ARTERY DISEASE 10/06/2007   a. s/p PCI to LAD; b. s/p CABG; c. LHC 07/13/12: Proximal LAD stent patent, LIMA-LAD atretic, D1 occluded, proximal circumflex 30%, mid RCA occluded, SVG-D1 normal, SVG-OM2 normal, SVG-distal RCA normal, EF 40% with diffuse HK  . Dizziness   . History of colonic polyps 10/30/2009   No polyps in 2011. No repeat.    Marland Kitchen HYPERLIPIDEMIA 10/06/2007  . HYPERTENSION 10/06/2007  . LBBB 09/06/2008  . Left renal mass   . MELANOMA 09/06/2008   MOES PROCEDURE RIGHT  . MYOCARDIAL INFARCTION, HX OF 10/06/2007  . NICM (nonischemic cardiomyopathy) ([place])    Echocardiogram 07/12/12: EF 25-30%, diffuse HK, mild AI, mild MR, mild LAE  . PERSONAL HX COLONIC POLYPS 10/30/2009  . Presence of permanent cardiac pacemaker   . Syncope   . UTERINE CANCER, HX OF 09/06/2008  . VARICOSE VEIN  09/29/2009    Past Surgical History:  Procedure Laterality Date  . ABDOMINAL HYSTERECTOMY  1988  . BI-VENTRICULAR PACEMAKER INSERTION (CRT-P)  02/02/2013   St. Jude, serial no. #[id number]   . CARDIAC CATHETERIZATION  08/30/2008  . CORONARY ANGIOPLASTY WITH STENT PLACEMENT  2009  . CORONARY ARTERY BYPASS GRAFT  2004  . IR RADIOLOGIST EVAL & MGMT  02/08/2017  . LEFT HEART CATHETERIZATION WITH CORONARY ANGIOGRAM Bilateral 07/13/2012   Procedure: LEFT HEART CATHETERIZATION WITH CORONARY ANGIOGRAM;  Surgeon: Peter M Martinique, MD;  Location: [facility] CATH LAB;  Service: Cardiovascular;  Laterality: Bilateral;  . OOPHORECTOMY    . PERMANENT PACEMAKER INSERTION N/A 02/02/2013   Procedure: PERMANENT PACEMAKER INSERTION;  Surgeon: Evans Lance, MD;  Location: [facility] CATH LAB;  Service: Cardiovascular;  Laterality: N/A;  . RADIOLOGY WITH ANESTHESIA Left 03/28/2017   Procedure: RENAL CRYO ABLATION;  Surgeon: Aletta Edouard, MD;  Location: [facility] ORS;  Service: Radiology;  Laterality: Left;  . right distal pretibeal     melanoma    Current Outpatient Medications  Medication Sig Dispense Refill  . ELIQUIS 5 MG TABS tablet Take 5 mg by mouth daily.  5  . escitalopram (LEXAPRO) 20 MG tablet TAKE 1 TABLET(20 MG) BY MOUTH DAILY (Patient taking differently: TAKE 1 TABLET(20 MG) BY MOUTH at bedtime) 30 tablet 5  . HYDROcodone-acetaminophen (NORCO/VICODIN) 5-325 MG tablet Take 1-2 tablets by mouth every 6 (six) hours as needed for moderate pain. 15 tablet 0  . levofloxacin (LEVAQUIN) 750 MG tablet Take 1  tablet (750 mg total) by mouth daily. 4 tablet 0  . lisinopril (PRINIVIL,ZESTRIL) 2.5 MG tablet TAKE 1 TABLET(2.5 MG) BY MOUTH DAILY (Patient taking differently: TAKE 1 TABLET(2.5 MG) BY MOUTH at bedtime) 30 tablet 9  . mirabegron ER (MYRBETRIQ) 25 MG TB24 tablet Take 1 tablet (25 mg total) by mouth daily. 30 tablet 5  . pyridostigmine (MESTINON) 60 MG tablet TAKE 1 TABLET(60 MG) BY MOUTH TWICE DAILY 60 tablet 5   No  current facility-administered medications for this visit.     Allergies:   Aricept [donepezil] and Nitroglycerin   Social History:  The patient  reports that  has never smoked. she has never used smokeless tobacco. She reports that she drinks alcohol. She reports that she does not use drugs.   Family History:  The patient's family history includes Heart attack in her father; Stroke in her mother.  ROS:  Please see the history of present illness.    All other systems are reviewed and otherwise negative.   PHYSICAL EXAM: *** VS:  There were no vitals taken for this visit. BMI: There is no height or weight on file to calculate BMI. Well nourished, well developed, in no acute distress  HEENT: normocephalic, atraumatic  Neck: no JVD, carotid bruits or masses Cardiac:  *** RRR; no significant murmurs, no rubs, or gallops Lungs:  *** CTA b/l, no wheezing, rhonchi or rales  Abd: soft, nontender MS: no deformity or *** atrophy Ext: *** no edema  Skin: warm and dry, no rash Neuro:  No gross deficits appreciated Psych: euthymic mood, full affect  *** PPM site is stable, no tethering or discomfort   EKG:  Not done today PPM interrogation done today and reviewed by myself: ***  04/09/16: TTE Study Conclusions - Left ventricle: The cavity size was normal. Wall thickness was   normal. Systolic function was moderately to severely reduced. The   estimated ejection fraction was in the range of 30% to 35%.   Moderate diffuse hypokinesis with no identifiable regional   variations. - Ventricular septum: Septal motion showed abnormal function,   dyssynergy, and paradox. These changes are consistent with right   ventricular pacing. - Aortic valve: There was trivial regurgitation. - Mitral valve: There was mild to moderate regurgitation. - Left atrium: The atrium was moderately dilated. - Right ventricle: Systolic function was mildly reduced. - Right atrium: The atrium was moderately  dilated.   Recent Labs: 04/05/2017: Hemoglobin 8.2; Platelets 178 04/06/2017: BUN 11; Creatinine, Ser 0.61; Potassium 3.5; Sodium 134  No results found for requested labs within last 8760 hours.   Estimated Creatinine Clearance: 53.7 mL/min (by C-G formula based on SCr of 0.61 mg/dL).   Wt Readings from Last 3 Encounters:  04/04/17 143 lb 8.3 oz (65.1 kg)  04/04/17 136 lb (61.7 kg)  03/28/17 130 lb (59 kg)     Other studies reviewed: Additional studies/records reviewed today include: summarized above  ASSESSMENT AND PLAN:  1. CRT-P     ***  2. Persistent Afib     CHA2DS2Vasc is 3, on eliquis     ***  3. CAD     ***  4. HTN     ***   Disposition: F/u with ***  Current medicines are reviewed at length with the patient today.  The patient did not have any concerns regarding medicines.***  Signed, [name], PA-C 04/10/2017 11:15 AM     [facility] [address] [place] [address] [phone] (  office)  [phone] (fax)

## 2017-04-11 ENCOUNTER — Encounter: Payer: Self-pay | Admitting: Physician Assistant

## 2017-04-11 ENCOUNTER — Encounter: Payer: Medicare Other | Admitting: Physician Assistant

## 2017-04-19 ENCOUNTER — Other Ambulatory Visit: Payer: Self-pay | Admitting: *Deleted

## 2017-04-19 DIAGNOSIS — N2889 Other specified disorders of kidney and ureter: Secondary | ICD-10-CM

## 2017-04-27 ENCOUNTER — Telehealth: Payer: Self-pay | Admitting: Family Medicine

## 2017-04-27 NOTE — Telephone Encounter (Signed)
Copied from [place] [id number]. Topic: Inquiry >> Apr 27, 2017  8:24 AM Scherrie Gerlach wrote: Reason for CRM: c hris with [facility] requesting verbal orders for home health PT 3 wk/ 2 2 wk / 6

## 2017-04-28 NOTE — Telephone Encounter (Signed)
Called and provided verbal order as requested 

## 2017-04-29 ENCOUNTER — Encounter: Payer: Self-pay | Admitting: Physician Assistant

## 2017-05-04 ENCOUNTER — Telehealth (HOSPITAL_COMMUNITY): Payer: Self-pay | Admitting: Family Medicine

## 2017-05-04 ENCOUNTER — Ambulatory Visit: Payer: Medicare Other | Admitting: Family Medicine

## 2017-05-04 ENCOUNTER — Encounter: Payer: Self-pay | Admitting: Family Medicine

## 2017-05-04 VITALS — BP 132/64 | HR 75 | Temp 97.7°F | Ht 68.0 in | Wt 129.4 lb

## 2017-05-04 DIAGNOSIS — I10 Essential (primary) hypertension: Secondary | ICD-10-CM | POA: Diagnosis not present

## 2017-05-04 DIAGNOSIS — I5042 Chronic combined systolic (congestive) and diastolic (congestive) heart failure: Secondary | ICD-10-CM

## 2017-05-04 DIAGNOSIS — R410 Disorientation, unspecified: Secondary | ICD-10-CM

## 2017-05-04 DIAGNOSIS — R627 Adult failure to thrive: Secondary | ICD-10-CM

## 2017-05-04 LAB — COMPREHENSIVE METABOLIC PANEL
ALBUMIN: 3.4 g/dL — AB (ref 3.5–5.2)
ALT: 8 U/L (ref 0–35)
AST: 11 U/L (ref 0–37)
Alkaline Phosphatase: 68 U/L (ref 39–117)
BUN: 14 mg/dL (ref 6–23)
CHLORIDE: 103 meq/L (ref 96–112)
CO2: 27 mEq/L (ref 19–32)
Calcium: 9 mg/dL (ref 8.4–10.5)
Creatinine, Ser: 0.68 mg/dL (ref 0.40–1.20)
GFR: 87.72 mL/min (ref >60.00)
Glucose, Bld: 103 mg/dL — ABNORMAL HIGH (ref 70–99)
Potassium: 3.9 mEq/L (ref 3.5–5.1)
SODIUM: 140 meq/L (ref 135–145)
TOTAL PROTEIN: 6.2 g/dL (ref 6.0–8.3)
Total Bilirubin: 0.7 mg/dL (ref 0.2–1.2)

## 2017-05-04 LAB — POC URINALSYSI DIPSTICK (AUTOMATED)
Bilirubin, UA: NEGATIVE
Glucose, UA: NEGATIVE
Ketones, UA: NEGATIVE
Leukocytes, UA: NEGATIVE
NITRITE UA: NEGATIVE
PH UA: 6 (ref 5.0–8.0)
Protein, UA: NEGATIVE
Spec Grav, UA: 1.025 (ref 1.010–1.025)
UROBILINOGEN UA: 1 U/dL

## 2017-05-04 LAB — CBC
HCT: 30.5 % — ABNORMAL LOW (ref 36.0–46.0)
Hemoglobin: 9.9 g/dL — ABNORMAL LOW (ref 12.0–15.0)
MCHC: 32.5 g/dL (ref 30.0–36.0)
MCV: 84.2 fl (ref 78.0–100.0)
Platelets: 190 10*3/uL (ref 150.0–400.0)
RBC: 3.63 Mil/uL — AB (ref 3.87–5.11)
RDW: 16 % — ABNORMAL HIGH (ref 11.5–15.5)
WBC: 3.4 10*3/uL — AB (ref 4.0–10.5)

## 2017-05-04 NOTE — Patient Instructions (Addendum)
Please stop by lab before you go  We will call you within a week or two about your referral for echocardiogram. If you do not hear within 3 weeks, give [place] a call.   Urine did not look like an obvious infection but we will still get a culture to make sure

## 2017-05-04 NOTE — Progress Notes (Signed)
Subjective:  [name] [name] is a 84 y.o. year old very pleasant female patient who presents for/with See problem oriented charting ROS-confusion each morning in particular, edema has improved in the last few days, denies shortness of breath, denies fever or significant cough or congestion.  Past Medical History-  Patient Active Problem List   Diagnosis Date Noted  . Left renal mass 01/27/2017    Priority: High  . CAD (coronary artery disease) 04/17/2014    Priority: High  . Mild cognitive impairment 04/17/2014    Priority: High  . Pacemaker -CRT-St. Jude 05/21/2013    Priority: High  . Atrioventricular block, complete ([place]) 03/08/2013    Priority: High  . Paroxysmal atrial fibrillation (HCC) 02/03/2013    Priority: High  . Chronic combined systolic and diastolic congestive heart failure ([place]) 10/10/2012    Priority: High  . Ischemic cardiomyopathy  EF 30% cath 4/14 with stent 10/06/2007    Priority: High  . Overactive bladder 07/09/2016    Priority: Medium  . Dizziness 05/07/2016    Priority: Medium  . Microscopic hematuria 09/02/2015    Priority: Medium  . Asthma, intrinsic 02/03/2013    Priority: Medium  . Depression 09/19/2012    Priority: Medium  . History of melanoma 09/06/2008    Priority: Medium  . History of uterine cancer 09/06/2008    Priority: Medium  . Hyperlipidemia 10/06/2007    Priority: Medium  . Essential hypertension 10/06/2007    Priority: Medium  . Allergic rhinitis 04/17/2014    Priority: Low  . Hearing loss 04/17/2014    Priority: Low  . Varicose veins 09/29/2009    Priority: Low  . LBBB (left bundle branch block) 09/06/2008    Priority: Low  . HCAP (healthcare-associated pneumonia) 04/04/2017  . Traumatic perinephric hematoma of left kidney 03/29/2017  . Acute blood loss as cause of postoperative anemia 03/29/2017    Medications- reviewed and updated Current Outpatient Medications  Medication Sig Dispense Refill  . ELIQUIS 5 MG TABS  tablet Take 5 mg by mouth daily.  5  . escitalopram (LEXAPRO) 20 MG tablet TAKE 1 TABLET(20 MG) BY MOUTH DAILY (Patient taking differently: TAKE 1 TABLET(20 MG) BY MOUTH at bedtime) 30 tablet 5  . levofloxacin (LEVAQUIN) 750 MG tablet Take 1 tablet (750 mg total) by mouth daily. 4 tablet 0  . lisinopril (PRINIVIL,ZESTRIL) 2.5 MG tablet TAKE 1 TABLET(2.5 MG) BY MOUTH DAILY (Patient taking differently: TAKE 1 TABLET(2.5 MG) BY MOUTH at bedtime) 30 tablet 9  . mirabegron ER (MYRBETRIQ) 25 MG TB24 tablet Take 1 tablet (25 mg total) by mouth daily. 30 tablet 5  . pyridostigmine (MESTINON) 60 MG tablet TAKE 1 TABLET(60 MG) BY MOUTH TWICE DAILY 60 tablet 5    Objective: BP 132/64 (BP Location: Left Arm, Patient Position: Sitting, Cuff Size: Large)   Pulse 75   Temp 97.7 F (36.5 C) (Oral)   Ht 5\' 8"  (1.727 m)   Wt 129 lb 6.4 oz (58.7 kg)   SpO2 97%   BMI 19.68 kg/m  Gen: NAD, appears thinner into the face than last visit CV: RRR no murmurs rubs or gallops Lungs: CTAB no crackles, wheeze, rhonchi Abdomen: soft/nontender/nondistended/normal bowel sounds.  Ext: no edema Skin: warm, dry Neuro: Now walking with walker and appears much more slow than previously  Results for orders placed or performed in visit on 05/04/17 (from the past 24 hour(s))  POCT Urinalysis Dipstick (Automated)     Status: Abnormal   Collection Time: 05/04/17 10:30 AM  Result Value Ref Range   Color, UA Yellow    Clarity, UA Hazy    Glucose, UA Negative    Bilirubin, UA Negative    Ketones, UA Negative    Spec Grav, UA 1.025 1.010 - 1.025   Blood, UA 3+    pH, UA 6.0 5.0 - 8.0   Protein, UA Negative    Urobilinogen, UA 1.0 0.2 or 1.0 E.U./dL   Nitrite, UA Negative    Leukocytes, UA Negative Negative   Assessment/Plan:   Confusion - Plan: POCT Urinalysis Dipstick (Automated), Urine Culture, Urine Culture  Chronic combined systolic and diastolic congestive heart failure (HCC) - Plan: ECHOCARDIOGRAM  COMPLETE  Essential hypertension - Plan: CBC, Comprehensive metabolic panel  Failure to thrive in adult S: Patient is an 84 year old female with a history of left renal mass likely due to malignancy now status post cryoablation, atrial fibrillation on Eliquis, coronary artery disease with history of coronary artery bypass grafting, systolic heart failure with ejection fraction of 25-30% when last checked in 2017, hypertension, memory loss who presented to the emergency room with progressive weakness, difficulty ambulating and increased confusion and was ultimately diagnosed with possible pneumonia and failure to thrive.  In the emergency room patient was found to be febrile and her chest x-ray on admission showed slight opacification in the left lung base which was thought potentially atelectasis versus pneumonia.  Due to her fever and deterioration she was started on broad-spectrum antibiotics which were later narrowed to Levaquin.  She had blood cultures done which were negative.  Ultimately she was evaluated by physical therapy who recommended discharge to skilled nursing facility for strengthening.  For her other chronic medical problems during the hospitalization -she was chest pain-free through the admission in regards to her CAD. For her systolic CHF,  Her daughter did note some edema toward the end of the hospitalization and then when patient returned home.  When patient was at [facility] she was treated with 5 days of Lasix due to the edema.  Apparently in the last 2 days this has drastically improved-the edema.  Her daughter requested updated echocardiogram in case there was some demand ischemia when patient was ill in addition to the fact that her hemoglobin was running as low as 8.3.  Patient was continued on her Eliquis given her history of atrial fibrillation.  Her repeat CT imaging of the abdomen showed decreased size of prior hematoma which occurred after cryoablation for the left lower  kidney mass.  Patient is to follow-up with Dr. Araceli Bouche of urology as well as Dr. Kathlene Cote on February 14 of radiology.  Her blood pressure was maintained on home lisinopril.  She was noted to be anemic after cryoablation but her hemoglobin was largely stable during hospitalization.  No transfusion was completed this certainly could have been considered given her history of coronary artery disease and hemoglobin under 9.  Patient stayed at [facility] from April 08, 2017 till April 23, 2017 after this 4-day admission for pneumonia and failure to thrive.  As noted above patient was having some edema both at [facility] and at home.  Fortunately this is better in the last 2 days.  Caregiver and daughter have noted that her memory seems slightly worse.  Patient is having multiple caregivers coming in out of the home which is disorienting for the patient.  The morning certainly seem worse to caregiver who is with her today.  Daughter asked about potential for urinary tract infection and we  checked the urine which only showed blood-we will get urine culture.  Patient has not had chest pain, shortness of breath, fever since being home in regards to the pneumonia.  She has been compliant with PT and occupational therapy 3 times a week at home but does need some encouragement to do well with these activities. A/P: Confusion - UA largely reassuring (in light of hematoma and recent cryoablation with blood noted). Will get culture to rule out UTI. We discussed with recent stressors- new caregivers, hospitalization, weight loss- and baseline memory loss that delirium could be triggered by even minor issues.  Patient is having some vivid dreams, could trial of melatonin if workup is reassuring.  Chronic combined systolic and diastolic congestive heart failure -we will update echocardiogram per daughter's request.  I think this is reasonable-concern for demand ischemia with recent illness and low hemoglobin to  assess for any new wall motion abnormalities.  Plus this will be done by the time patient sees Dr. Caryl Comes  Essential hypertension -blood pressure controlled, continue lisinopril  Failure to thrive in adult-I am glad patient has 24-hour care at this point.  Having folks around prepare meals will be very helpful for her.  She should continue her physical therapy and occupational therapy and I encouraged her to do this.  Future Appointments  Date Time Provider [place]  05/06/2017  2:00 PM [facility] ECHO 1 [id number] [facility]  05/12/2017  1:45 PM Deboraha Sprang, MD [facility]  05/26/2017  2:30 PM [facility] IR [facility]  06/09/2017  3:00 PM Narda Amber K, DO [facility] None   Lab/Order associations: Confusion - Plan: POCT Urinalysis Dipstick (Automated), Urine Culture, Urine Culture  Chronic combined systolic and diastolic congestive heart failure ([place]) - Plan: ECHOCARDIOGRAM COMPLETE  Essential hypertension - Plan: CBC, Comprehensive metabolic panel  Return precautions advised.  Garret Reddish, MD

## 2017-05-05 LAB — URINE CULTURE
MICRO NUMBER: 90096703
SPECIMEN QUALITY: ADEQUATE

## 2017-05-05 NOTE — Telephone Encounter (Signed)
User: [name] [name] A Date/time: 05/04/17 11:23 AM  Comment: Called pt and lmsg for her to CB to get sch for echo before 1/31  Context:  Outcome: Left Message  Phone number: [phone] Phone Type: Home Phone  Comm. type: Telephone Call type: Outgoing  Contact: Orvan Seen Relation to patient: Self

## 2017-05-06 ENCOUNTER — Other Ambulatory Visit: Payer: Self-pay

## 2017-05-06 ENCOUNTER — Ambulatory Visit (HOSPITAL_COMMUNITY): Payer: Medicare Other | Attending: Internal Medicine

## 2017-05-06 ENCOUNTER — Telehealth: Payer: Self-pay | Admitting: Family Medicine

## 2017-05-06 DIAGNOSIS — I420 Dilated cardiomyopathy: Secondary | ICD-10-CM | POA: Insufficient documentation

## 2017-05-06 DIAGNOSIS — I088 Other rheumatic multiple valve diseases: Secondary | ICD-10-CM | POA: Diagnosis not present

## 2017-05-06 DIAGNOSIS — I5042 Chronic combined systolic (congestive) and diastolic (congestive) heart failure: Secondary | ICD-10-CM | POA: Insufficient documentation

## 2017-05-06 NOTE — Telephone Encounter (Signed)
Copied from [place]. Topic: General - Other >> May 06, 2017 10:48 AM Lolita Rieger, RMA wrote: Reason for CRM: Anna Genre from [facility] needs verbal order for speech therapy for 1 wk 1, 2 wk 1, 1 wk 1 Please call [phone] with verbal

## 2017-05-06 NOTE — Telephone Encounter (Signed)
Called the number listed 3 times. The number has been changed, disconnected, or is no longer in service

## 2017-05-06 NOTE — Telephone Encounter (Signed)
Please contact the number provided to advise.

## 2017-05-09 NOTE — Telephone Encounter (Signed)
Attempted to call again but received message that number has been disconnected, changed, or is no longer in service

## 2017-05-12 ENCOUNTER — Ambulatory Visit (INDEPENDENT_AMBULATORY_CARE_PROVIDER_SITE_OTHER): Payer: Medicare Other | Admitting: Internal Medicine

## 2017-05-12 ENCOUNTER — Encounter (INDEPENDENT_AMBULATORY_CARE_PROVIDER_SITE_OTHER): Payer: Self-pay

## 2017-05-12 ENCOUNTER — Encounter: Payer: Self-pay | Admitting: Internal Medicine

## 2017-05-12 VITALS — BP 108/60 | HR 50 | Ht 68.0 in | Wt 131.2 lb

## 2017-05-12 DIAGNOSIS — Z950 Presence of cardiac pacemaker: Secondary | ICD-10-CM | POA: Diagnosis not present

## 2017-05-12 DIAGNOSIS — I442 Atrioventricular block, complete: Secondary | ICD-10-CM | POA: Diagnosis not present

## 2017-05-12 DIAGNOSIS — I255 Ischemic cardiomyopathy: Secondary | ICD-10-CM

## 2017-05-12 NOTE — Progress Notes (Signed)
Patient Care Team: Marin Olp, MD as PCP - General (Family Medicine)   HPI  [name] [name] is a 84 y.o. female Seen in followup for syncope which was in part orthostatic and partially related to intermittent high-grade heart block. She underwent CRT P.. She has evolved device dependence  She has ischemic heart disease with prior MI and bypass surgery; prior PCI to the LAD and previously normal left ventricular function. However, on evaluation 4/14 echocardiogram demonstrated EF 25-30%. LHC 4/14 Proximal LAD stent patent, LIMA-LAD atretic, D1 occluded, proximal circumflex 30%, mid RCA occluded, SVG-D1 normal, SVG-OM2 normal, SVG-distal RCA normal, EF 40% with diffuse HK   Echocardiogram 2/15 demonstrated ejection fraction of 30-35%  DATE TEST    2/15 Echo    EF 20-25 %   12/17 Echo    EF 30-35 %   1/19 Echo  EF 45-50% Mild AR MR      She has a history of atrial fibrillation. She is on apixoban. Thromboembolic risk profile is notable for age-2 coronary artery disease-1 congestive heart failure-1 gender-1 stroke?-2 for a CHADS-VASc were greater than or equal to 5       She underwent cryoablation for a kidney lesion in December.  This was complicated by significant bleeding requiring rehospitalization as well as significant peripheral edema which is gradually improved  Echocardiogram was obtained as noted above  She has then developed complications of Mestinon therapy including insomnia.  This is improved with down titration.  Date cr K Hgb  9/18 0.86 4.1 12.5  1/19 0.68 3.9 9.9       Past Medical History:  Diagnosis Date  . Atrial fibrillation ([place]) 10/06/2007   post op  . COPD (chronic obstructive pulmonary disease) ([place])    Emphysema on 02/03/13 CXR  . CORONARY ARTERY DISEASE 10/06/2007   a. s/p PCI to LAD; b. s/p CABG; c. LHC 07/13/12: Proximal LAD stent patent, LIMA-LAD atretic, D1 occluded, proximal circumflex 30%, mid RCA occluded, SVG-D1 normal, SVG-OM2  normal, SVG-distal RCA normal, EF 40% with diffuse HK  . Dizziness   . History of colonic polyps 10/30/2009   No polyps in 2011. No repeat.    Marland Kitchen HYPERLIPIDEMIA 10/06/2007  . HYPERTENSION 10/06/2007  . LBBB 09/06/2008  . Left renal mass   . MELANOMA 09/06/2008   MOES PROCEDURE RIGHT  . MYOCARDIAL INFARCTION, HX OF 10/06/2007  . NICM (nonischemic cardiomyopathy) ([place])    Echocardiogram 07/12/12: EF 25-30%, diffuse HK, mild AI, mild MR, mild LAE  . PERSONAL HX COLONIC POLYPS 10/30/2009  . Presence of permanent cardiac pacemaker   . Syncope   . UTERINE CANCER, HX OF 09/06/2008  . VARICOSE VEIN 09/29/2009    Past Surgical History:  Procedure Laterality Date  . ABDOMINAL HYSTERECTOMY  1988  . BI-VENTRICULAR PACEMAKER INSERTION (CRT-P)  02/02/2013   St. Jude, serial no. #[id number]   . CARDIAC CATHETERIZATION  08/30/2008  . CORONARY ANGIOPLASTY WITH STENT PLACEMENT  2009  . CORONARY ARTERY BYPASS GRAFT  2004  . IR RADIOLOGIST EVAL & MGMT  02/08/2017  . LEFT HEART CATHETERIZATION WITH CORONARY ANGIOGRAM Bilateral 07/13/2012   Procedure: LEFT HEART CATHETERIZATION WITH CORONARY ANGIOGRAM;  Surgeon: Peter M Martinique, MD;  Location: [facility] CATH LAB;  Service: Cardiovascular;  Laterality: Bilateral;  . OOPHORECTOMY    . PERMANENT PACEMAKER INSERTION N/A 02/02/2013   Procedure: PERMANENT PACEMAKER INSERTION;  Surgeon: Evans Lance, MD;  Location: [facility] CATH LAB;  Service: Cardiovascular;  Laterality: N/A;  . RADIOLOGY  WITH ANESTHESIA Left 03/28/2017   Procedure: RENAL CRYO ABLATION;  Surgeon: Aletta Edouard, MD;  Location: [facility] ORS;  Service: Radiology;  Laterality: Left;  . right distal pretibeal     melanoma    Current Outpatient Medications  Medication Sig Dispense Refill  . apixaban (ELIQUIS) 5 MG TABS tablet Take 5 mg by mouth 2 (two) times daily.    Marland Kitchen escitalopram (LEXAPRO) 20 MG tablet Take 20 mg by mouth daily.    Marland Kitchen lisinopril (PRINIVIL,ZESTRIL) 2.5 MG tablet Take 2.5 mg by mouth daily.    .  mirabegron ER (MYRBETRIQ) 25 MG TB24 tablet Take 1 tablet (25 mg total) by mouth daily. 30 tablet 5  . pyridostigmine (MESTINON) 60 MG tablet Take 60 mg by mouth 2 (two) times daily. 1 tablet po in the am and 0.5 tablet po at noon.     No current facility-administered medications for this visit.     Allergies  Allergen Reactions  . Aricept [Donepezil] Shortness Of Breath and Other (See Comments)    Hallucinations and loss of sensation in legs  . Nitroglycerin Other (See Comments)    Oral spray form causes rapid drop in blood pressure.      Review of Systems negative except from HPI and PMH  Physical Exam BP 108/60   Pulse (!) 50   Ht 5\' 8"  (1.727 m)   Wt 131 lb 3.2 oz (59.5 kg)   SpO2 97%   BMI 19.95 kg/m  Well developed and nourished in no acute distress HENT normal Neck supple with JVP-flat Clear Regular rate and rhythm, no murmurs or gallops Abd-soft with active BS No Clubbing cyanosis edema Skin-warm and dry A & Oriented  Grossly normal sensory and motor function   ECG demonstrates  Sinus rhythm with P synchronous pacing.  Upright QRS in lead V1  Assessment and  Plan  Hypertension  Complete heart block  Orthostatic intolerance  CHF chronic systolic  Anemia  Atrial fibrillation paroysmal   Pacemaker-St. Jude-CRT The patient's device was interrogated.  The information was reviewed. No changes were made in the programming.     Ischemic cardiomyopathy with prior bypass     No symptoms of ischemia no significant atrial fibrillation and surprisingly none during her recent hospitalization  Orthostatic tolerance is quite good.  She is tolerating the lower dose of Mestinon  She is euvolemic.  She is anemic.  We will begin her on iron.

## 2017-05-12 NOTE — Patient Instructions (Addendum)
Medication Instructions:  Your physician recommends that you continue on your current medications as directed. Please refer to the Current Medication list given to you today.  Labwork: None ordered.    Testing/Procedures: None ordered.  Follow-Up: Your physician recommends that you schedule a follow-up appointment in: One Year with Dr Caryl Comes   Remote monitoring is used to monitor your Pacemaker from home. This monitoring reduces the number of office visits required to check your device to one time per year. It allows [place] to keep an eye on the functioning of your device to ensure it is working properly. You are scheduled for a device check from home on 08/11/2017. You may send your transmission at any time that day. If you have a wireless device, the transmission will be sent automatically. After your physician reviews your transmission, you will receive a postcard with your next transmission date.    Any Other Special Instructions Will Be Listed Below (If Applicable).     If you need a refill on your cardiac medications before your next appointment, please call your pharmacy.

## 2017-05-20 ENCOUNTER — Other Ambulatory Visit: Payer: Self-pay | Admitting: Internal Medicine

## 2017-05-23 NOTE — Telephone Encounter (Signed)
Pt last saw Dr Caryl Comes 05/12/17, last labs 05/04/17 Creat 0.68, age 84, weight 59.5.  Weight has trended down over last month weight 04/04/17 61.7kg.  Based on specified criteria pt is not on appropriate dosage of Eliquis, however too soon to really determine if weight is truly treading down and going to stay down.  Discussed with Georgina Peer, PharmD will refill rx x 3 months and reassess weight at that time.  If weight remains below 60kg, may need to lower Eliquis dosage to 2.5mg  BID.

## 2017-05-24 LAB — BASIC METABOLIC PANEL WITH GFR
BUN: 16 mg/dL (ref 7–25)
CO2: 29 mmol/L (ref 20–32)
CREATININE: 0.79 mg/dL (ref 0.60–0.88)
Calcium: 8.7 mg/dL (ref 8.6–10.4)
Chloride: 101 mmol/L (ref 98–110)
GFR, EST NON AFRICAN AMERICAN: 69 mL/min/{1.73_m2} (ref >=60)
GFR, Est African American: 80 mL/min/{1.73_m2} (ref >=60)
GLUCOSE: 121 mg/dL (ref 65–139)
Potassium: 3.8 mmol/L (ref 3.5–5.3)
Sodium: 137 mmol/L (ref 135–146)

## 2017-05-24 LAB — CBC
HEMATOCRIT: 30.9 % — AB (ref 35.0–45.0)
HEMOGLOBIN: 10 g/dL — AB (ref 11.7–15.5)
MCH: 25.9 pg — ABNORMAL LOW (ref 27.0–33.0)
MCHC: 32.4 g/dL (ref 32.0–36.0)
MCV: 80.1 fL (ref 80.0–100.0)
MPV: 12.4 fL (ref 7.5–12.5)
Platelets: 159 10*3/uL (ref 140–400)
RBC: 3.86 10*6/uL (ref 3.80–5.10)
RDW: 14.9 % (ref 11.0–15.0)
WBC: 4.6 10*3/uL (ref 3.8–10.8)

## 2017-05-26 ENCOUNTER — Other Ambulatory Visit: Payer: Self-pay

## 2017-05-26 ENCOUNTER — Ambulatory Visit: Payer: Self-pay | Admitting: *Deleted

## 2017-05-26 ENCOUNTER — Encounter: Payer: Self-pay | Admitting: Family Medicine

## 2017-05-26 ENCOUNTER — Ambulatory Visit (INDEPENDENT_AMBULATORY_CARE_PROVIDER_SITE_OTHER): Payer: Medicare Other

## 2017-05-26 ENCOUNTER — Ambulatory Visit: Payer: Medicare Other | Admitting: Family Medicine

## 2017-05-26 ENCOUNTER — Ambulatory Visit
Admission: RE | Admit: 2017-05-26 | Discharge: 2017-05-26 | Disposition: A | Discharge status: Home / Self Care | Payer: Medicare Other | Source: Ambulatory Visit | Attending: Radiology | Admitting: Radiology

## 2017-05-26 VITALS — BP 126/50 | HR 73 | Temp 97.7°F | Ht 68.0 in | Wt 126.6 lb

## 2017-05-26 DIAGNOSIS — M25532 Pain in left wrist: Secondary | ICD-10-CM | POA: Diagnosis not present

## 2017-05-26 DIAGNOSIS — N2889 Other specified disorders of kidney and ureter: Secondary | ICD-10-CM

## 2017-05-26 HISTORY — PX: IR RADIOLOGIST EVAL & MGMT: IMG5224

## 2017-05-26 NOTE — Patient Instructions (Addendum)
Please schedule follow up with Dr. Paulla Fore in 1 week  Please wear wrist brace as much as possible- 24 hours is ideal accept for showering/hand washing/time for icing  Ice 15-20 minutes 3-4x a day for next 3-4 days. Heat may feel good after that.   Can use tylenol 3x a day at 650mg  (separate by 6 hours)- actually for first 48 hours probably good to schedule this.   Can you help me update meds

## 2017-05-26 NOTE — Telephone Encounter (Signed)
Pt's daughter called this morning and stated her mom had fallen and used her left wrist to break her fall. She states that the wrist is starting to swell and she is on Eliquis . She wants to get her wrist xrayed  to r/o fracture. . Dr Ansel Bong office notified.

## 2017-05-26 NOTE — Telephone Encounter (Signed)
Called patient's daughter [name] this morning and I scheduled patient in the 10:15am slot this morning. I had entered the order for the wrist x-ray prior to her being seen by Dr. Yong Channel. Daughter was aware

## 2017-05-26 NOTE — Progress Notes (Signed)
Chief Complaint: S/P Cryoablation of renal mass  Referring Physician(s): [name] [name]  Supervising Physician: [name] [name]  History of Present Illness: [name] [name] is a 84 y.o. female who had a CT of the abdomen and pelvis performed on 12/30/2016 for evaluation of right sided abdominal pain.   This revealed an incidental finding of a roughly 3.2 cm enhancing mass of the posterior left lower kidney.   CT on 01/11/2017 at [facility] demonstrated no evidence of metastatic disease in the chest.   The mass measured approximately 3.4 cm in maximum diameter.   The mass was largely endophytic with deep margin nearly abutting the collecting system.   No evidence of renal vein tumor, local lymphadenopathy or metastatic disease in the abdomen. No other suspicious renal lesions are identified bilaterally.   At the time of initial consult with [name] [name] on 02/08/2017, [name] [name] was asymptomatic with respect to the renal mass. She did have some hematuria on urinalysis approximately 1 year ago.  Given her significant cardiac history of heart block, prior defibrillator placement , anticoagulation for atrial fibrillation and coronary artery disease with prior coronary stent placement, there was indication for minimally invasive treatment of the renal mass.  She underwent biopsy of the mass and cryoablation by [name] [name] on 03/28/2017.  Her stay was complicated by development of a hematoma despite holding Eliquis the appropriate time.  She developed symptomatic anemia from this and her hospital stay was longer than anticipated.  Pathology revealed oncocytic neoplasm.  She is here today for her post procedure follow up.  Unfortunately she had a fall this morning and was seen by [name] [name] for wrist pain.   Xray done shows no acute abnormality of the wrist. Her daughter is with her today and states "they are worried she broke her scaphoid bone".  She others wise  feels good. No complaints.  Hgb is up to 10. Creatinine is normal.  Past Medical History:  Diagnosis Date  . Atrial fibrillation ([place]) 10/06/2007   post op  . COPD (chronic obstructive pulmonary disease) ([place])    Emphysema on 02/03/13 CXR  . CORONARY ARTERY DISEASE 10/06/2007   a. s/p PCI to LAD; b. s/p CABG; c. LHC 07/13/12: Proximal LAD stent patent, LIMA-LAD atretic, D1 occluded, proximal circumflex 30%, mid RCA occluded, SVG-D1 normal, SVG-OM2 normal, SVG-distal RCA normal, EF 40% with diffuse HK  . Dizziness   . History of colonic polyps 10/30/2009   No polyps in 2011. No repeat.    Marland Kitchen HYPERLIPIDEMIA 10/06/2007  . HYPERTENSION 10/06/2007  . LBBB 09/06/2008  . Left renal mass   . MELANOMA 09/06/2008   MOES PROCEDURE RIGHT  . MYOCARDIAL INFARCTION, HX OF 10/06/2007  . NICM (nonischemic cardiomyopathy) ([place])    Echocardiogram 07/12/12: EF 25-30%, diffuse HK, mild AI, mild MR, mild LAE  . PERSONAL HX COLONIC POLYPS 10/30/2009  . Presence of permanent cardiac pacemaker   . Syncope   . UTERINE CANCER, HX OF 09/06/2008  . VARICOSE VEIN 09/29/2009    Past Surgical History:  Procedure Laterality Date  . ABDOMINAL HYSTERECTOMY  1988  . BI-VENTRICULAR PACEMAKER INSERTION (CRT-P)  02/02/2013   St. Jude, serial no. #[id number]   . CARDIAC CATHETERIZATION  08/30/2008  . CORONARY ANGIOPLASTY WITH STENT PLACEMENT  2009  . CORONARY ARTERY BYPASS GRAFT  2004  . IR RADIOLOGIST EVAL & MGMT  02/08/2017  . LEFT HEART CATHETERIZATION WITH CORONARY ANGIOGRAM Bilateral 07/13/2012   Procedure: LEFT HEART CATHETERIZATION WITH CORONARY ANGIOGRAM;  Surgeon: Peter M Martinique, MD;  Location: [facility] CATH LAB;  Service: Cardiovascular;  Laterality: Bilateral;  . OOPHORECTOMY    . PERMANENT PACEMAKER INSERTION N/A 02/02/2013   Procedure: PERMANENT PACEMAKER INSERTION;  Surgeon: Evans Lance, MD;  Location[facility] C[facility] LAB;  Service: Cardiovascular;  Laterality: N/A;  . RADIOLOGY WITH ANESTHESIA Left 03/28/2017   Procedure:  RENAL CRYO ABLATION;  Surgeon: [name] Edouard, MD;  Location: [facility] ORS;  Service: Radiology;  Laterality: Left;  . right distal pretibeal     melanoma    Allergies: Aricept [donepezil] and Nitroglycerin  Medications: Prior to Admission medications   Medication Sig Start Date End Date Taking? Authorizing Provider  ELIQUIS 5 MG TABS tablet TAKE 1 TABLET(5 MG) BY MOUTH TWICE DAILY 05/23/17   Deboraha Sprang, MD  escitalopram (LEXAPRO) 20 MG tablet Take 20 mg by mouth daily.    [provider]  lisinopril (PRINIVIL,ZESTRIL) 2.5 MG tablet Take 2.5 mg by mouth daily.    [provider]  mirabegron ER (MYRBETRIQ) 25 MG TB24 tablet Take 1 tablet (25 mg total) by mouth daily. 11/04/16   [name] Olp, MD  pyridostigmine (MESTINON) 60 MG tablet Take 60 mg by mouth daily with breakfast. .    [provider]     Family History  Problem Relation Age of Onset  . Stroke Mother   . Heart attack Father   . Colon cancer Neg Hx     Social History   Socioeconomic History  . Marital status: Widowed    Spouse name: Not on file  . Number of children: Not on file  . Years of education: Not on file  . Highest education level: Not on file  Social Needs  . Financial resource strain: Not on file  . Food insecurity - worry: Not on file  . Food insecurity - inability: Not on file  . Transportation needs - medical: Not on file  . Transportation needs - non-medical: Not on file  Occupational History  . Not on file  Tobacco Use  . Smoking status: Never Smoker  . Smokeless tobacco: Never Used  Substance and Sexual Activity  . Alcohol use: Yes    Comment:  OCC gin or wine  . Drug use: No  . Sexual activity: Not on file  Other Topics Concern  . Not on file  Social History Narrative   Widowed. Lives alone with new cat as of 2018. 3 children. 6 grandkids   Daughter  (NP) and son in Sports coach (pharmacist-teaches at [employer]) that live close and help out.       Drives in the  daytime, shops for herself, cleaning-has someone over to clean due to the intermittent lightheadedness, bathing      Retired from physical education.       Review of Systems: A 12 point ROS discussed and pertinent positives are indicated in the HPI above.  All other systems are negative. Review of Systems  Vital Signs: BP (!) 113/54   Pulse 63   Temp 97.8 F (36.6 C) (Oral)   Resp 14   Ht 5\' 9"  (1.753 m)   Wt 126 lb (57.2 kg)   SpO2 97%   BMI 18.61 kg/m   Physical Exam  Constitutional: She is oriented to person, place, and time. She appears well-developed.  HENT:  Head: Normocephalic and atraumatic.  Eyes: EOM are normal.  Neck: Normal range of motion.  Cardiovascular: Normal rate, regular rhythm and normal heart sounds.  Pulmonary/Chest: Effort normal  and breath sounds normal.  Abdominal: Soft. She exhibits no distension. There is no tenderness.  Musculoskeletal: Normal range of motion.  Neurological: She is alert and oriented to person, place, and time.  Skin: Skin is warm and dry.  Psychiatric: She has a normal mood and affect. Her behavior is normal. Judgment and thought content normal.  Left wrist in a brace.  Imaging: Dg Wrist Complete Left  Result Date: 05/26/2017 CLINICAL DATA:  Left wrist pain after fall today. EXAM: LEFT WRIST - COMPLETE 3+ VIEW COMPARISON:  None. FINDINGS: There is no evidence of fracture or dislocation. Chondrocalcinosis of radiocarpal joints is noted. Moderate degenerative changes seen involving the first carpal/metacarpal joint. Soft tissues are unremarkable. IMPRESSION: Osteoarthritis involving the first carpometacarpal joint. No acute abnormality seen in the left wrist. Electronically Signed   By: Marijo Conception, M.D.   On: 05/26/2017 10:00    Labs:  CBC: Recent Labs    04/05/17 0401 04/05/17 0547 05/04/17 1058 05/24/17 1659  WBC 8.7 8.1 3.4* 4.6  HGB 8.5* 8.2* 9.9* 10.0*  HCT 25.9* 24.5* 30.5* 30.9*  PLT 185 178 190.0 159     COAGS: Recent Labs    03/22/17 1217 03/30/17 0150  INR 1.10 1.10  APTT  --  26    BMP: Recent Labs    04/05/17 0401 04/05/17 0547 04/06/17 0340 05/04/17 1058 05/24/17 1659  NA 135 134* 134* 140 137  K 3.2* 3.4* 3.5 3.9 3.8  CL 101 100* 101 103 101  CO2 25 26 26 27 29   GLUCOSE 106* 109* 116* 103* 121  BUN 12 12 11 14 16   CALCIUM 7.9* 7.7* 8.0* 9.0 8.7  CREATININE 0.69 0.66 0.61 0.68 0.79  GFRNONAA >60 >60 >60  --  69  GFRAA >60 >60 >60  --  80    LIVER FUNCTION TESTS: Recent Labs    05/04/17 1058  BILITOT 0.7  AST 11  ALT 8  ALKPHOS 68  PROT 6.2  ALBUMIN 3.4*    TUMOR MARKERS: No results for input(s): AFPTM, CEA, CA199, CHROMGRNA in the last 8760 hours.  Assessment:  S/P Cryoablation of left renal oncogenic neoplasm by [name] [name] on 19/50/9326 complicated by development of hematoma.  Other than a fall today with left wrist injury, she is doing well.  Will have her return late March for repeat CT scan for follow up.   Electronically Signed: Murrell Redden PA-C 05/26/2017, 2:46 PM   Please refer to Dr. Margaretmary Dys attestation of this note for management and plan.

## 2017-05-26 NOTE — Telephone Encounter (Signed)
See note

## 2017-05-26 NOTE — Progress Notes (Signed)
Subjective:  [name] [name] is a 84 y.o. year old very pleasant female patient who presents for/with See problem oriented charting ROS- no chest pain or shortness of breath. No dizziness. States simply tripped (though with memory issues hard to know fully). No edema in legs. Some swelling in left wrist.    Past Medical History-  Patient Active Problem List   Diagnosis Date Noted  . Left renal mass 01/27/2017    Priority: High  . CAD (coronary artery disease) 04/17/2014    Priority: High  . Mild cognitive impairment 04/17/2014    Priority: High  . Pacemaker -CRT-St. Jude 05/21/2013    Priority: High  . Atrioventricular block, complete ([place]) 03/08/2013    Priority: High  . Paroxysmal atrial fibrillation ([facility]) 02/03/2013    Priority: High  . Chronic combined systolic and diastolic congestive heart failure ([place]) 10/10/2012    Priority: High  . Ischemic cardiomyopathy  EF 30% cath 4/14 with stent 10/06/2007    Priority: High  . Overactive bladder 07/09/2016    Priority: Medium  . Dizziness 05/07/2016    Priority: Medium  . Microscopic hematuria 09/02/2015    Priority: Medium  . Asthma, intrinsic 02/03/2013    Priority: Medium  . Depression 09/19/2012    Priority: Medium  . History of melanoma 09/06/2008    Priority: Medium  . History of uterine cancer 09/06/2008    Priority: Medium  . Hyperlipidemia 10/06/2007    Priority: Medium  . Essential hypertension 10/06/2007    Priority: Medium  . Allergic rhinitis 04/17/2014    Priority: Low  . Hearing loss 04/17/2014    Priority: Low  . Varicose veins 09/29/2009    Priority: Low  . LBBB (left bundle branch block) 09/06/2008    Priority: Low  . HCAP (healthcare-associated pneumonia) 04/04/2017  . Traumatic perinephric hematoma of left kidney 03/29/2017  . Acute blood loss as cause of postoperative anemia 03/29/2017    Medications- reviewed and updated Current Outpatient Medications  Medication Sig Dispense Refill  . ELIQUIS  5 MG TABS tablet TAKE 1 TABLET(5 MG) BY MOUTH TWICE DAILY 60 tablet 2  . escitalopram (LEXAPRO) 20 MG tablet Take 20 mg by mouth daily.    Marland Kitchen lisinopril (PRINIVIL,ZESTRIL) 2.5 MG tablet Take 2.5 mg by mouth daily.    . mirabegron ER (MYRBETRIQ) 25 MG TB24 tablet Take 1 tablet (25 mg total) by mouth daily. 30 tablet 5  . pyridostigmine (MESTINON) 60 MG tablet Take 60 mg by mouth daily with breakfast. .     No current facility-administered medications for this visit.     Objective: BP (!) 126/50 (BP Location: Right Arm, Patient Position: Sitting, Cuff Size: Normal)   Pulse 73   Temp 97.7 F (36.5 C) (Oral)   Ht 5\' 8"  (1.727 m)   Wt 126 lb 9.6 oz (57.4 kg)   SpO2 98%   BMI 19.25 kg/m  Gen: NAD, resting comfortably CV: RRR no murmurs rubs or gallops Lungs: CTAB no crackles, wheeze, rhonchi Ext: no edema Skin: warm, dry Neuro: walks with cane in right hand now MSK: diffuse pain in wrist- both radial and ulnar side. Some swelling noted. Mild pain in anatomic snuff box.   Dg Wrist Complete Left  Result Date: 05/26/2017 CLINICAL DATA:  Left wrist pain after fall today. EXAM: LEFT WRIST - COMPLETE 3+ VIEW COMPARISON:  None. FINDINGS: There is no evidence of fracture or dislocation. Chondrocalcinosis of radiocarpal joints is noted. Moderate degenerative changes seen involving the first carpal/metacarpal joint.  Soft tissues are unremarkable. IMPRESSION: Osteoarthritis involving the first carpometacarpal joint. No acute abnormality seen in the left wrist. Electronically Signed   By: Marijo Conception, M.D.   On: 05/26/2017 10:00   Assessment/Plan:  Acute pain of left wrist - Plan: Ambulatory referral to Sports Medicine S: right handed patientFell on outstretched left  hand this morning coming back from bathroom after getting out of bed. She has moderate aching pain- points diffusely over lower portion of hand/wrist on left hand. Denies dizziness. Denies other recent falls. Was not using her cane  or walker at the time.   Daughter held eliquis this morning in case there was a significant hematoma (not obvious on exam but could develop)  A/P: 84 year old right handed female with Devol on left hand with moderate level pain. No obvious fracture on x-ray but does have arthritic changes. Do not strongly suspect but Cannot rule out scaphoid fracture- will place in thumb spica brace. Dr. Paulla Fore kind enough to consult during visit and agrees to see patient in 1 week follow up. Underlying arthritis may make this pain worse and may be primary cause of pain and will improve quickly. Daughter held eliquis this morning- will restart later today. Tylenol only for pain  Future Appointments  Date Time Provider [place]  05/26/2017  2:30 PM [facility] IR [facility]  06/09/2017  3:00 PM Narda Amber K, DO [facility] None  08/11/2017  9:50 AM [facility] DEVICE REMOTES [facility]  10/27/2017  2:15 PM Yong Channel Brayton Mars, MD [facility]   Lab/Order associations: Acute pain of left wrist - Plan: Ambulatory referral to Sports Medicine X-ray wrist also ordered under this  Return precautions advised.  Garret Reddish, MD

## 2017-06-02 ENCOUNTER — Encounter: Payer: Self-pay | Admitting: Sports Medicine

## 2017-06-02 ENCOUNTER — Ambulatory Visit: Payer: Self-pay

## 2017-06-02 ENCOUNTER — Ambulatory Visit (INDEPENDENT_AMBULATORY_CARE_PROVIDER_SITE_OTHER): Payer: Medicare Other | Admitting: Sports Medicine

## 2017-06-02 VITALS — BP 118/68 | HR 76 | Ht 69.0 in | Wt 125.6 lb

## 2017-06-02 DIAGNOSIS — Z7901 Long term (current) use of anticoagulants: Secondary | ICD-10-CM | POA: Diagnosis not present

## 2017-06-02 DIAGNOSIS — M1812 Unilateral primary osteoarthritis of first carpometacarpal joint, left hand: Secondary | ICD-10-CM | POA: Diagnosis not present

## 2017-06-02 MED ORDER — DICLOFENAC SODIUM 1 % TD GEL: TRANSDERMAL | 2 refills | Status: DC

## 2017-06-02 NOTE — Assessment & Plan Note (Signed)
She has a small effusion of the left CMC joint I do not appreciate any significant osteophytic fracture however cannot entirely be ruled out.  We will have her use topical diclofenac given the low systemic absorption and check in with her in 2 weeks.  I am okay with her trying to wean out of the thumb spica brace as tolerated but suspect she will need to be in this for several more weeks especially at night and while out of the house.  Given her overall good functionality at home and difficulties with performing home tasks currently would like for her to be evaluated by occupational therapy at home and referral for this has been placed.

## 2017-06-02 NOTE — Procedures (Signed)
LIMITED MSK ULTRASOUND OF left hand Images were obtained and interpreted by myself, Teresa Coombs, DO  Images have been saved and stored to PACS system. Images obtained on: GE S7 Ultrasound machine  FINDINGS:   Generalized osteoarthritic spurring of the DRUJ and CMC joint.  No significant cortical disruptions appreciated.  Moderately tense effusion of the [facility] joint with increased neovascularity within this area consistent with acute flare of CMC arthritis  IMPRESSION:  1. No appreciable osteophytic fracture although this cannot be entirely excluded 2. CMC arthritis with acute exacerbation and CMC effusion 3. If any lack of improvement with topical Voltaren will consider injection

## 2017-06-02 NOTE — Progress Notes (Signed)
[name] [name]. [place], [name] at [facility] [phone]  [name] [name] - 84 y.o. female MRN [id number]  Date of birth: March 03, 1932  Visit Date: 06/02/2017  PCP: [name] Olp, MD   Referred by: [name] Olp, MD   Scribe for today's visit: [name] [name], CMA     SUBJECTIVE:  [name] [name] is here for New Patient (Initial Visit) (L hand pain) .  Referred by: Dr. Garret [name] Her L hand pain symptoms INITIALLY: Began 05/26/17 after falling on outstretched L hand. Pt is R hand dominant. She was not using her cane or walker when she fell.  Described as mild aching, nonradiating Worsened with gripping Improved with nothing noted. Additional associated symptoms include: no numbness/tingling noted into the L hand    At this time symptoms are improving compared to onset w/ decreased pain noted. She has been advised by PCP to take Tylenol 650 mg TID, ice, and wear wrist brace.  She is no longer taking Tylenol and is no longer icing her wrist.  X-ray L hand done 05/26/17, showed arthritis.    ROS Denies night time disturbances. Denies fevers, chills, or night sweats. Denies unexplained weight loss. Denies personal history of cancer. Denies changes in bowel or bladder habits. Reports recent unreported falls.  [place] injury on 05/26/17. Denies new or worsening dyspnea or wheezing. Denies headaches or dizziness.  Denies numbness, tingling or weakness  In the extremities.  Denies dizziness or presyncopal episodes Denies lower extremity edema     HISTORY & PERTINENT PRIOR DATA:  Prior History reviewed and updated per electronic medical record.  Significant history, findings, studies and interim changes include:  reports that  has never smoked. she has never used smokeless tobacco. Recent Labs    04/04/17 2023  LABURIC 3.3   No specialty comments available. Problem  Arthritis of Carpometacarpal (Cmc) Joint of Left Thumb  Current  Use of Long Term Anticoagulation   Eliquis for A. fib     OBJECTIVE:  VS:  HT:5\' 9"  (175.3 cm)   WT:125 lb 9.6 oz (57 kg)  BMI:18.54    BP:118/68  HR:76bpm  TEMP: ( )  RESP:95 %   PHYSICAL EXAM: Constitutional: WDWN, Non-toxic appearing. Psychiatric: Alert & appropriately interactive.  Not depressed or anxious appearing. Respiratory: No increased work of breathing.  Trachea Midline Eyes: Pupils are equal.  EOM intact without nystagmus.  No scleral icterus  NEUROVASCULAR exam: No clubbing or cyanosis appreciated No significant venous stasis changes Capillary Refill: normal, less than 2 seconds   Left hand overall well aligned without significant malalignment.  She does have generalized osteoarthritic bossing CMC joint as well as DRUJ..  There is atrophy of the intrinsic hand musculature.  Localized effusion over the [facility] joint and pain with axial load of the [place].  No significant pain over the scaphoid or distal radius. Prior x-rays reviewed that did show degenerative osteophytic spurring throughout the entire hand consistent with chondrocalcinosis as well.   ASSESSMENT & PLAN:   1. Arthritis of carpometacarpal (CMC) joint of left thumb   2. Current use of long term anticoagulation    PLAN:    Arthritis of carpometacarpal (CMC) joint of left thumb She has a small effusion of the left CMC joint I do not appreciate any significant osteophytic fracture however cannot entirely be ruled out.  We will have her use topical diclofenac given the low systemic absorption and check in with her in 2 weeks.  I  am okay with her trying to wean out of the thumb spica brace as tolerated but suspect she will need to be in this for several more weeks especially at night and while out of the house.  Given her overall good functionality at home and difficulties with performing home tasks currently would like for her to be evaluated by occupational therapy at home and referral for this has been  placed.  Current use of long term anticoagulation Given 1-2% absorption with diclofenac gel this is likely safe to use.  Watch for any increased signs of bleeding   ++++++++++++++++++++++++++++++++++++++++++++ Orders & Meds: Orders Placed This Encounter  Procedures  . [place] MSK POCT ULTRASOUND  . Ambulatory referral to Occupational Therapy    Meds ordered this encounter  Medications  . diclofenac sodium (VOLTAREN) 1 % GEL    Sig: Apply topically to affected area qid    Dispense:  100 g    Refill:  2    ++++++++++++++++++++++++++++++++++++++++++++ Follow-up: Return in about 2 weeks (around 06/16/2017).   Pertinent documentation may be included in additional procedure notes, imaging studies, problem based documentation and patient instructions. Please see these sections of the encounter for additional information regarding this visit. CMA/ATC served as Education administrator during this visit. History, Physical, and Plan performed by medical provider. Documentation and orders reviewed and attested to.      [name] [name], [place] Sports Medicine Physician

## 2017-06-02 NOTE — Assessment & Plan Note (Signed)
Given 1-2% absorption with diclofenac gel this is likely safe to use.  Watch for any increased signs of bleeding

## 2017-06-04 LAB — CUP PACEART INCLINIC DEVICE CHECK
Brady Statistic RV Percent Paced: 98 %
Date Time Interrogation Session: 20190131200945
Implantable Lead Implant Date: 20141024
Implantable Lead Implant Date: 20141024
Implantable Lead Location: 753858
Implantable Pulse Generator Implant Date: 20141024
Lead Channel Impedance Value: 375 Ohm
Lead Channel Impedance Value: 825 Ohm
Lead Channel Pacing Threshold Amplitude: 1.125 V
Lead Channel Pacing Threshold Amplitude: 1.25 V
Lead Channel Sensing Intrinsic Amplitude: 1.6 mV
Lead Channel Sensing Intrinsic Amplitude: 12 mV
Lead Channel Setting Pacing Amplitude: 2.125
Lead Channel Setting Pacing Pulse Width: 0.4 ms
Lead Channel Setting Pacing Pulse Width: 0.5 ms
MDC IDC LEAD IMPLANT DT: 20141024
MDC IDC LEAD LOCATION: 753859
MDC IDC LEAD LOCATION: 753860
MDC IDC MSMT BATTERY REMAINING LONGEVITY: 85 mo
MDC IDC MSMT BATTERY VOLTAGE: 2.96 V
MDC IDC MSMT LEADCHNL LV PACING THRESHOLD PULSEWIDTH: 0.5 ms
MDC IDC MSMT LEADCHNL RA PACING THRESHOLD AMPLITUDE: 0.5 V
MDC IDC MSMT LEADCHNL RA PACING THRESHOLD PULSEWIDTH: 0.4 ms
MDC IDC MSMT LEADCHNL RV IMPEDANCE VALUE: 387.5 Ohm
MDC IDC MSMT LEADCHNL RV PACING THRESHOLD PULSEWIDTH: 0.4 ms
MDC IDC SET LEADCHNL RA PACING AMPLITUDE: 2 V
MDC IDC SET LEADCHNL RV PACING AMPLITUDE: 2.5 V
MDC IDC SET LEADCHNL RV SENSING SENSITIVITY: 5 mV
MDC IDC STAT BRADY RA PERCENT PACED: 20 %
Pulse Gen Model: 3242
Pulse Gen Serial Number: 7516165

## 2017-06-05 ENCOUNTER — Other Ambulatory Visit: Payer: Self-pay | Admitting: Internal Medicine

## 2017-06-09 ENCOUNTER — Ambulatory Visit (INDEPENDENT_AMBULATORY_CARE_PROVIDER_SITE_OTHER): Payer: Medicare Other | Admitting: Neurology

## 2017-06-09 ENCOUNTER — Encounter: Payer: Self-pay | Admitting: Neurology

## 2017-06-09 VITALS — BP 110/60 | HR 70 | Ht 69.0 in | Wt 127.1 lb

## 2017-06-09 DIAGNOSIS — F0280 Dementia in other diseases classified elsewhere without behavioral disturbance: Secondary | ICD-10-CM | POA: Diagnosis not present

## 2017-06-09 DIAGNOSIS — R42 Dizziness and giddiness: Secondary | ICD-10-CM

## 2017-06-09 DIAGNOSIS — G309 Alzheimer's disease, unspecified: Secondary | ICD-10-CM | POA: Diagnosis not present

## 2017-06-09 NOTE — Patient Instructions (Signed)
Return to clinic in 6 months.

## 2017-06-09 NOTE — Progress Notes (Signed)
Follow-up Visit   Date: 01/06/2017    [name] [name] MRN: [id number] DOB: Aug 25, 1931   Interim History: [name] [name] is a 84 y.o. right-handed Caucasian female with PAF s/p PPM (on Eliquis), CAD s/p PCI, NICM EF 35%, hypertension, mild cognitive impairment, remote history of melanoma, and hyperlipidemia returning to the clinic for follow-up of orthostatic lightheadedness.  The patient was accompanied to the clinic by daughter who also provides collateral information.    History of present illness: Starting in 2014, she began having spells of lightheadedness, described as unsteadiness.  She denies dizziness with room spinning.  Review of Epic notes shows that she had a syncopal spell in March 2014 after getting up from the breakfast table to walk to the bathroom.  This lasted a few seconds. She had another episode a couple of days later.  A Holter for 30 days showed occasional SVT. During another episode of severe dizziness, a PPM was placed because she was found to have heart block (2 to 1 block) in October 2014.  Following the PPM, the patient improved slightly, but continued to have lightheadedness especially with prolonged standing. She tried midodrine 2.5mg  before PPM placement and that did not help. She saw Dr. Debria Garret at [facility] neurology who started her on mestinon 30mg  TID and cannot tell whether it helped or not.    On December 23rd, she had another spell of lightheadedness and she activated her medical alert system. She did not loose consciousness.  She felt confused.  EMS arrived and transported her to the emergency deparment where she was found to be orthostatic. She continues to have lightheadedness in the morning and then improves by the evening.  She had noticed that abrupt movements exacerbates symptoms. She is noncompliant with her abdominal binder and stockings.  She had tried drinking lots of fluids, but did not find benefit.  She lives alone and reports to eating small meals.  Breakfast consists of tea and a breakfast bar, lunch is a sandwich, and dinner is microwave lean cuisine meal.  She does not cook.  She had lost 15lb over the past year unintentionally.  She is followed by Dr. Caryl Comes for her cardiac disease and orthostatic hypotension.  Notes indicate that her syncopal spells may have initially been due to high-grade heart block and orthostatic hypotension.  Due to her low blood pressure, her coreg was discontinued without marked difference in her symptoms.  She denies dry eyes, dry mouth, urinary retention, slowed movements, gait imbalance, or falls.   UPDATE 01/06/2017: She is here for six-month appointment and reports to doing well. Since increasing Mestinon to 60 mg twice daily, she no longer has spells of lightheadedness, despite stopping her compression stockings. Her daughter-in-law notes progressive memory changes and patient endorses getting overwhelmed with addressing her mail and bills. She continues to live alone and manages her own IADLs and ADLs.  She is no longer going to [facility] for dementia follow-up. She has previously been tried on Aricept and Namenda, both which she did not tolerate. 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 She endorses feeling depressed and was started on Lexapro which was increased recently to 20 mg daily.   She was recently found to have a renal mass and is scheduled to see oncology for evaluation.  UPDATE 06/09/2017:  She is here for 6 month follow-up visit.  She unfortunately has had a few hospitalizations she was last here. In early December, she underewnt  cryoablation of left renal mass which was complicated by perinephric hematoma.  She was readmitted in December with pneumonia and stayed at rehab facility for 2 weeks.  She will be having surveillance imaging of the renal mass in the next few months, however daughter tells me that they do not wish any aggressive measures.    Since returning home, he is able to walk unassisted and uses cane as needed.  She has daily caregiver for 10-hr daily that helps with meals, medication administration, light housekeeping, and errands. She has become increasingly forgetful over the past months, and has not recovered to her prior baseline. Dr. states that she forgets conversations, does not remember tasks or appointments. There has been no change in behavior. She does not want to start any medications for her memory, she has not tolerated this in the past. Her mood is doing better now that she has company of a caregiver in the home. Her lightheadedness is well-controlled at this time.  Medications:  Current Outpatient Medications on File Prior to Visit  Medication Sig Dispense Refill  . diclofenac sodium (VOLTAREN) 1 % GEL Apply topically to affected area qid 100 g 2  . ELIQUIS 5 MG TABS tablet TAKE 1 TABLET(5 MG) BY MOUTH TWICE DAILY 60 tablet 2  . escitalopram (LEXAPRO) 20 MG tablet Take 20 mg by mouth daily.    Marland Kitchen lisinopril (PRINIVIL,ZESTRIL) 2.5 MG tablet TAKE 1 TABLET(2.5 MG) BY MOUTH DAILY 90 tablet 2  . mirabegron ER (MYRBETRIQ) 25 MG TB24 tablet Take 1 tablet (25 mg total) by mouth daily. 30 tablet 5  . pyridostigmine (MESTINON) 60 MG tablet Take 60 mg by mouth daily with breakfast. .     No current facility-administered medications on file prior to visit.     Allergies:  Allergies  Allergen Reactions  . Aricept [Donepezil] Shortness Of Breath and Other (See Comments)    Hallucinations and loss of sensation in legs  . Nitroglycerin Other (See Comments)    Oral spray form causes rapid drop in blood pressure.      Review of Systems:  CONSTITUTIONAL: No fevers, chills, night sweats, +weight loss.  EYES: No visual changes or eye pain ENT: No hearing changes.  No history of nose bleeds.   RESPIRATORY: No cough, wheezing and shortness of breath.   CARDIOVASCULAR: Negative for chest pain, and palpitations.   GI:  Negative for abdominal discomfort, blood in stools or black stools.  No recent change in bowel habits.   GU:  No history of incontinence.   MUSCLOSKELETAL: No history of joint pain or swelling.  No myalgias.   SKIN: Negative for lesions, rash, and itching.   ENDOCRINE: Negative for cold or heat intolerance, polydipsia or goiter.   PSYCH:  No depression or anxiety symptoms.   NEURO: As Above.   Vital Signs:  BP 110/60   Pulse 70   Ht 5\' 9"  (1.753 m)   Wt 127 lb 2 oz (57.7 kg)   SpO2 94%   BMI 18.77 kg/m  No data found.  General:  Well-appearing, comfortable and well dressed  Neurological Exam: MENTAL STATUS: She correctly identifies 3 year and the president. She states the month is April and date of the week is Tuesday. When asked who she would call in the setting of an emergency, she replies she would push her medical alert or call [facility] emergency line). Strong English accent. There is no dysarthria.  CRANIAL NERVES:   Pupils equal round and reactive to light.  Normal conjugate, extra-ocular eye movements in all directions of gaze.  No ptosis.  Face is symmetric. Palate elevates symmetrically.  Tongue is midline.  MOTOR:  Motor strength is 5/5 in all extremities.  There is reduced muscle bulk throughout. No abnormal movements. Tone is normal.    MSRs:  Reflexes are 2+/4 throughout  COORDINATION/GAIT: Gait narrow based and stable and unassisted. She is able to stand up without using arms to push off the chair.  Data:  ARS 05/09/2013: This is an abnormal study although limited because of the pacemaker. There is evidence for patchy autonomic failure with orthostatic hypotension. Clinical correlation is suggested.   NCS/EMG 05/09/2013: This is a normal study. Specifically, there is no electrophysiological evidence to suggest the presence of peripheral neuropathy on this test.   MRI brain 07/11/2012: No acute infarct. Prominent small vessel disease type changes. Global  atrophy without hydrocephalus. Right frontal region sub centimeter calcified structure may represent a small meningioma and is without change. No associate mass effect.  Cervical spondylotic changes with spinal stenosis and cord flattening most prominent C4-5 level.  MRA head 07/11/2012: Left vertebral artery is diminutive in size after the takeoff of the left PICA. The right vertebral artery is dominant and ectatic. No significant stenosis of the basilar artery. Anterior circulation without medium or large size vessel significant stenosis or occlusion. Branch vessel irregularity as noted above.  Labs 04/30/2014:  HIV NR, RPR NR, folate 10.4, vitamin B12 276  US carotids 07/12/2012: - No significant extracranial carotid artery stenosisdemonstrated. Vertebrals are patent with antegrade flow. - ICA/CCA ratio: right= 1.01. left =0.88.  IMPRESSION/PLAN: 1.  Alzheimer's dementia worsening in the setting of acute illness.  Hopefully, as she continues to recover from her recent illness, her cognition will also improve, although I doubt she will function at her prior level given her other medical comorbidities. She does not want to be on any medications for her memory, she has not tolerated donepezil and Namenda in the past. I've encouraged her to stay active with her home physical therapy as well as participate in cognitively stimulating activities. I do recommend a caregiver support which they have coordinated, and because she would prefer to stay at home, she may eventually need 24-hour supervision as her disease progresses. At this time, she has 10 hours per day caregiver supervision.  Neither patient nor her daughter have any home safety concerns with her being alone at nighttime. I have asked the daughter to write 911 on her telephone, should she need emergency services and misplaces her medical alert.  2.  Orthostatic lightheadedness stable. Continue Mestinon twice a day as needed.   Return to  clinic in 6 months  Greater than 50% of this 30 minute visit was spent in counseling, explanation of diagnosis, planning of further management, and coordination of care.    Thank you for allowing me to participate in patient's care.  If I can answer any additional questions, I would be pleased to do so.    Sincerely,    Donika K. Posey Pronto, DO

## 2017-06-14 ENCOUNTER — Encounter: Payer: Self-pay | Admitting: Sports Medicine

## 2017-06-14 ENCOUNTER — Ambulatory Visit: Payer: Medicare Other | Admitting: Sports Medicine

## 2017-06-14 VITALS — BP 120/66 | HR 84 | Ht 69.0 in | Wt 128.0 lb

## 2017-06-14 DIAGNOSIS — Z7901 Long term (current) use of anticoagulants: Secondary | ICD-10-CM

## 2017-06-14 DIAGNOSIS — M1812 Unilateral primary osteoarthritis of first carpometacarpal joint, left hand: Secondary | ICD-10-CM | POA: Diagnosis not present

## 2017-06-14 DIAGNOSIS — M25551 Pain in right hip: Secondary | ICD-10-CM | POA: Diagnosis not present

## 2017-06-14 NOTE — Progress Notes (Signed)
[name] [name]. [place], [place] at [facility] [phone]  Pa Tennant - 84 y.o. female MRN [id number]  Date of birth: 1931-06-21  Visit Date: 06/14/2017  PCP: Marin Olp, MD   Referred by: Marin Olp, MD   Scribe for today's visit: Josepha Pigg, CMA     SUBJECTIVE:  [name] [name] is here for Follow-up (arthritis CMC joint L thumb)   06/02/2017: Her L hand pain symptoms INITIALLY: Began 05/26/17 after falling on outstretched L hand. Pt is R hand dominant. She was not using her cane or walker when she fell.  Described as mild aching, nonradiating Worsened with gripping Improved with nothing noted. Additional associated symptoms include: no numbness/tingling noted into the L hand At this time symptoms are improving compared to onset w/ decreased pain noted. She has been advised by PCP to take Tylenol 650 mg TID, ice, and wear wrist brace.  She is no longer taking Tylenol and is no longer icing her wrist. X-ray L hand done 05/26/17, showed arthritis.   06/14/17: Compared to the last office visit, her previously described symptoms are improving, less painful, ROM has improved. Sx have not completely resolved but are much better. She is able to pick things up more easily now.  Current symptoms are mild & are nonradiating She has been using Voltaren gel and wearing brace. She had steroid injection 05/26/17.  She also noticed some relief from R hip pain after applying Voltaren gel to the hip. She noticed tenderness to palpation but this seems to be improving.    ROS Denies night time disturbances. Denies fevers, chills, or night sweats. Denies unexplained weight loss. Denies personal history of cancer. Denies changes in bowel or bladder habits. Denies recent unreported falls. Denies new or worsening dyspnea or wheezing. Denies headaches or dizziness.  Denies numbness, tingling or weakness  In the extremities.  Denies dizziness  or presyncopal episodes Denies lower extremity edema    HISTORY & PERTINENT PRIOR DATA:  Prior History reviewed and updated per electronic medical record.  Significant history, findings, studies and interim changes include:  reports that  has never smoked. she has never used smokeless tobacco. Recent Labs    04/04/17 2023  LABURIC 3.3   No specialty comments available. No problems updated.  OBJECTIVE:  VS:  HT:5\' 9"  (175.3 cm)   WT:128 lb (58.1 kg)  BMI:18.89    BP:120/66  HR:84bpm  TEMP: ( )  RESP:96 %   PHYSICAL EXAM: Constitutional: WDWN, Non-toxic appearing. Psychiatric: Alert & appropriately interactive.  Not depressed or anxious appearing. Respiratory: No increased work of breathing.  Trachea Midline Eyes: Pupils are equal.  EOM intact without nystagmus.  No scleral icterus  NEUROVASCULAR exam: No clubbing or cyanosis appreciated No significant venous stasis changes Capillary Refill: normal, less than 2 seconds   Left wrist and hand: Overall well aligned.  She does have a small amount of pain with palpation of the distal radial styloid and small amount of pain with Wynn Maudlin testing but this is minimal.  Her grip strength is intact.  She has no pain with normal radial or ulnar deviation.  Wrist extension wrist flexion range of motion is significantly improved and almost pain-free except for very terminal 10-15 degrees in each direction.  Pain does still localized over the [facility] joint.  Right hip does have small amount of pain within the gluteal musculature.  Negative straight leg raise.  Good internal and external rotation of bilateral hips.  ASSESSMENT & PLAN:   1. Arthritis of carpometacarpal (CMC) joint of left thumb   2. Current use of long term anticoagulation   3. Right hip pain    ++++++++++++++++++++++++++++++++++++++++++++ Orders & Meds: No orders of the defined types were placed in this encounter.  No orders of the defined types were placed in this  encounter.   ++++++++++++++++++++++++++++++++++++++++++++ PLAN:    Her wrist is seemingly doing significantly better.  I would like for her to use the Voltaren gel scheduled for the next 3-4 days just twice per day to see if we can decrease the residual pain that she has but overall think that she will continue to do well.  Low likelihood of a radial occult fracture at this point given the marked improvement.  She will plan to follow-up if any lack of improvement.  Right hip does have some signs of likely greater trochanteric syndrome however she does associate this with increasing her activity and being very busy with office visits the day prior to this occurring.  It was a single isolated episode but improved with Voltaren gel.  I am okay with him using this intermittently but if she is using this for more than 2-3 days he would like to plan to see her back.  No report of falls.  Follow-up: Return if symptoms worsen or fail to improve.   Pertinent documentation may be included in additional procedure notes, imaging studies, problem based documentation and patient instructions. Please see these sections of the encounter for additional information regarding this visit. CMA/ATC served as Education administrator during this visit. History, Physical, and Plan performed by medical provider. Documentation and orders reviewed and attested to.      Gerda Diss, [place] Sports Medicine Physician

## 2017-06-14 NOTE — Patient Instructions (Signed)
Please use your topical Voltaren gel 2x/day for the next 3 days.

## 2017-06-15 ENCOUNTER — Encounter: Payer: Self-pay | Admitting: Sports Medicine

## 2017-06-22 ENCOUNTER — Other Ambulatory Visit: Payer: Self-pay | Admitting: Radiology

## 2017-06-22 ENCOUNTER — Other Ambulatory Visit (HOSPITAL_COMMUNITY): Payer: Self-pay | Admitting: Interventional Radiology

## 2017-06-22 ENCOUNTER — Telehealth: Payer: Self-pay | Admitting: Family Medicine

## 2017-06-22 DIAGNOSIS — N2889 Other specified disorders of kidney and ureter: Secondary | ICD-10-CM

## 2017-06-22 NOTE — Telephone Encounter (Signed)
Yes, will forward to Dr. Paulla Fore to advise.

## 2017-06-22 NOTE — Telephone Encounter (Signed)
I believe the patient is already receiving home health PT if not can add PT eval and treat order to referral for difficulty walking she does have ongoing issues with this as well.

## 2017-06-22 NOTE — Telephone Encounter (Signed)
See note.  Copied from [place] [phone]. Topic: Referral - Status >> Jun 22, 2017  2:44 PM Arletha Grippe wrote: Reason for CRM: Lenna Sciara from [facility] called - the referral for OT is not approved as a stand alone discipline.  They want to know if provider wants to order PT as well, or nursing, etc... Doe he want to add a referral or id it is only OT, she'll have to go to outpatient facility.  Cb is [phone]

## 2017-06-23 NOTE — Telephone Encounter (Signed)
Spoke with Stanton Kidney at [facility] and gave verbal order to at PT for difficulty walking.

## 2017-06-24 NOTE — Telephone Encounter (Signed)
See note

## 2017-06-24 NOTE — Telephone Encounter (Signed)
Darlina Guys from [facility]  [phone] calling to let Dr Yong Channel know that they will be going to the patients home on Monday

## 2017-07-01 ENCOUNTER — Ambulatory Visit: Payer: Medicare Other | Admitting: Family Medicine

## 2017-07-01 ENCOUNTER — Encounter: Payer: Self-pay | Admitting: Family Medicine

## 2017-07-01 ENCOUNTER — Ambulatory Visit: Payer: Self-pay | Admitting: *Deleted

## 2017-07-01 VITALS — BP 120/68 | HR 72 | Temp 98.3°F

## 2017-07-01 DIAGNOSIS — R300 Dysuria: Secondary | ICD-10-CM | POA: Diagnosis not present

## 2017-07-01 DIAGNOSIS — R410 Disorientation, unspecified: Secondary | ICD-10-CM

## 2017-07-01 LAB — COMPREHENSIVE METABOLIC PANEL
ALT: 9 U/L (ref 0–35)
AST: 15 U/L (ref 0–37)
Albumin: 3.8 g/dL (ref 3.5–5.2)
Alkaline Phosphatase: 77 U/L (ref 39–117)
BUN: 15 mg/dL (ref 6–23)
CO2: 30 mEq/L (ref 19–32)
Calcium: 9.3 mg/dL (ref 8.4–10.5)
Chloride: 102 mEq/L (ref 96–112)
Creatinine, Ser: 0.84 mg/dL (ref 0.40–1.20)
GFR: 68.71 mL/min (ref >60.00)
Glucose, Bld: 83 mg/dL (ref 70–99)
Potassium: 4.3 mEq/L (ref 3.5–5.1)
Sodium: 140 mEq/L (ref 135–145)
Total Bilirubin: 0.6 mg/dL (ref 0.2–1.2)
Total Protein: 6.1 g/dL (ref 6.0–8.3)

## 2017-07-01 LAB — POCT URINALYSIS DIPSTICK
Bilirubin, UA: NEGATIVE
Glucose, UA: NEGATIVE
Ketones, UA: NEGATIVE
Leukocytes, UA: NEGATIVE
Nitrite, UA: NEGATIVE
Protein, UA: NEGATIVE
Spec Grav, UA: 1.025 (ref 1.010–1.025)
Urobilinogen, UA: 0.2 E.U./dL
pH, UA: 6 (ref 5.0–8.0)

## 2017-07-01 LAB — CBC WITH DIFFERENTIAL/PLATELET
Basophils Absolute: 0 10*3/uL (ref 0.0–0.1)
Basophils Relative: 0.5 % (ref 0.0–3.0)
Eosinophils Absolute: 0.3 10*3/uL (ref 0.0–0.7)
Eosinophils Relative: 5 % (ref 0.0–5.0)
HCT: 37.9 % (ref 36.0–46.0)
Hemoglobin: 12 g/dL (ref 12.0–15.0)
Lymphocytes Relative: 23.3 % (ref 12.0–46.0)
Lymphs Abs: 1.5 10*3/uL (ref 0.7–4.0)
MCHC: 31.7 g/dL (ref 30.0–36.0)
MCV: 82.3 fl (ref 78.0–100.0)
Monocytes Absolute: 0.5 10*3/uL (ref 0.1–1.0)
Monocytes Relative: 7.5 % (ref 3.0–12.0)
Neutro Abs: 4.1 10*3/uL (ref 1.4–7.7)
Neutrophils Relative %: 63.7 % (ref 43.0–77.0)
Platelets: 139 10*3/uL — ABNORMAL LOW (ref 150.0–400.0)
RBC: 4.6 Mil/uL (ref 3.87–5.11)
RDW: 20.6 % — ABNORMAL HIGH (ref 11.5–15.5)
WBC: 6.4 10*3/uL (ref 4.0–10.5)

## 2017-07-01 LAB — TSH: TSH: 1.95 u[IU]/mL (ref 0.35–4.50)

## 2017-07-01 NOTE — Telephone Encounter (Signed)
Debbe Odea called, no answer. Left message to call us back so we can discuss the patient's symptoms.

## 2017-07-01 NOTE — Telephone Encounter (Signed)
Called [name] [name] regarding the symptoms of the patient [name] [name]. No answer. Left message to call the office back so we can discuss her symptoms.

## 2017-07-01 NOTE — Telephone Encounter (Signed)
Awaiting for patient call back.

## 2017-07-01 NOTE — Telephone Encounter (Signed)
Pt seen in office today with Dr. Wallace. 

## 2017-07-02 ENCOUNTER — Encounter: Payer: Self-pay | Admitting: Family Medicine

## 2017-07-02 ENCOUNTER — Other Ambulatory Visit: Payer: Self-pay | Admitting: Family Medicine

## 2017-07-02 LAB — URINE CULTURE
MICRO NUMBER:: 90363236
SPECIMEN QUALITY:: ADEQUATE

## 2017-07-02 MED ORDER — BUSPIRONE HCL 5 MG PO TABS: 5.0000 mg | ORAL_TABLET | Freq: Two times a day (BID) | ORAL | 2 refills | Status: DC | PRN

## 2017-07-02 NOTE — Progress Notes (Signed)
[name] [name] is a 84 y.o. female here for an acute visit.  History of Present Illness:   HPI: Caregiver presents with patient to check urine for UTI. States concerned about more confusion. Patient denies any dysuria, frequency, urgency, incontinence, bowel changes, fever, N/V/D, or URI symptoms. States that she has been more stressed this week.   PMHx, SurgHx, SocialHx, Medications, and Allergies were reviewed in the Visit Navigator and updated as appropriate.  Current Medications:   Current Outpatient Medications:  .  diclofenac sodium (VOLTAREN) 1 % GEL, Apply topically to affected area qid, Disp: 100 g, Rfl: 2 .  ELIQUIS 5 MG TABS tablet, TAKE 1 TABLET(5 MG) BY MOUTH TWICE DAILY, Disp: 60 tablet, Rfl: 2 .  escitalopram (LEXAPRO) 20 MG tablet, Take 20 mg by mouth daily., Disp: , Rfl:  .  lisinopril (PRINIVIL,ZESTRIL) 2.5 MG tablet, TAKE 1 TABLET(2.5 MG) BY MOUTH DAILY, Disp: 90 tablet, Rfl: 2 .  mirabegron ER (MYRBETRIQ) 25 MG TB24 tablet, Take 1 tablet (25 mg total) by mouth daily., Disp: 30 tablet, Rfl: 5 .  pyridostigmine (MESTINON) 60 MG tablet, Take 60 mg by mouth daily with breakfast. ., Disp: , Rfl:    Allergies  Allergen Reactions  . Aricept [Donepezil] Shortness Of Breath and Other (See Comments)    Hallucinations and loss of sensation in legs  . Nitroglycerin Other (See Comments)    Oral spray form causes rapid drop in blood pressure.     Review of Systems:   Pertinent items are noted in the HPI. Otherwise, ROS is negative.  Vitals:   Vitals:   07/01/17 1124  BP: 120/68  Pulse: 72  Temp: 98.3 F (36.8 C)  TempSrc: Oral  SpO2: 95%     There is no height or weight on file to calculate BMI.  Physical Exam:   Physical Exam  Constitutional: She is oriented to person, place, and time. She appears well-developed and well-nourished.  HENT:  Mouth/Throat: Oropharynx is clear and moist.  Neck: Neck supple.  Cardiovascular: Normal rate.  Slightly irregular  rhythm but rate controlled  Pulmonary/Chest: Effort normal and breath sounds normal. No respiratory distress. She has no wheezes. She has no rales.  Musculoskeletal: She exhibits no edema.  Lymphadenopathy:    She has no cervical adenopathy.  Neurological: She is alert and oriented to person, place, and time. No cranial nerve deficit. Coordination normal.  No focal strength deficits. She is able to ambulate unassisted down the hallway but with somewhat of a slow gait No focal weakness  Psychiatric: She has a normal mood and affect. Her behavior is normal.    Results for orders placed or performed in visit on 07/01/17  CBC with Differential/Platelet  Result Value Ref Range   WBC 6.4 4.0 - 10.5 K/uL   RBC 4.60 3.87 - 5.11 Mil/uL   Hemoglobin 12.0 12.0 - 15.0 g/dL   HCT 37.9 36.0 - 46.0 %   MCV 82.3 78.0 - 100.0 fl   MCHC 31.7 30.0 - 36.0 g/dL   RDW 20.6 (H) 11.5 - 15.5 %   Platelets 139.0 (L) 150.0 - 400.0 K/uL   Neutrophils Relative % 63.7 43.0 - 77.0 %   Lymphocytes Relative 23.3 12.0 - 46.0 %   Monocytes Relative 7.5 3.0 - 12.0 %   Eosinophils Relative 5.0 0.0 - 5.0 %   Basophils Relative 0.5 0.0 - 3.0 %   Neutro Abs 4.1 1.4 - 7.7 K/uL   Lymphs Abs 1.5 0.7 - 4.0 K/uL  Monocytes Absolute 0.5 0.1 - 1.0 K/uL   Eosinophils Absolute 0.3 0.0 - 0.7 K/uL   Basophils Absolute 0.0 0.0 - 0.1 K/uL  Comprehensive metabolic panel  Result Value Ref Range   Sodium 140 135 - 145 mEq/L   Potassium 4.3 3.5 - 5.1 mEq/L   Chloride 102 96 - 112 mEq/L   CO2 30 19 - 32 mEq/L   Glucose, Bld 83 70 - 99 mg/dL   BUN 15 6 - 23 mg/dL   Creatinine, Ser 0.84 0.40 - 1.20 mg/dL   Total Bilirubin 0.6 0.2 - 1.2 mg/dL   Alkaline Phosphatase 77 39 - 117 U/L   AST 15 0 - 37 U/L   ALT 9 0 - 35 U/L   Total Protein 6.1 6.0 - 8.3 g/dL   Albumin 3.8 3.5 - 5.2 g/dL   Calcium 9.3 8.4 - 10.5 mg/dL   GFR 68.71 >60.00 mL/min  TSH  Result Value Ref Range   TSH 1.95 0.35 - 4.50 uIU/mL  POCT urinalysis dipstick    Result Value Ref Range   Color, UA Yellow    Clarity, UA Clear    Glucose, UA Negative    Bilirubin, UA Negative    Ketones, UA Negative    Spec Grav, UA 1.025 1.010 - 1.025   Blood, UA Moderate    pH, UA 6.0 5.0 - 8.0   Protein, UA Negative    Urobilinogen, UA 0.2 0.2 or 1.0 E.U./dL   Nitrite, UA Negative    Leukocytes, UA Negative Negative   Appearance     Odor      Assessment and Plan:   1. Confusion Labs and exam reassuring. Caregiver reassured. Will keep an eye out for UCx.  - POCT urinalysis dipstick - Urine Culture - CBC with Differential/Platelet - Comprehensive metabolic panel - TSH   . Reviewed expectations re: course of current medical issues. . Discussed self-management of symptoms. . Outlined signs and symptoms indicating need for more acute intervention. . Patient verbalized understanding and all questions were answered. Marland Kitchen Health Maintenance issues including appropriate healthy diet, exercise, and smoking avoidance were discussed with patient. . See orders for this visit as documented in the electronic medical record. . Patient received an After Visit Summary.  Briscoe Deutscher, DO [place], [facility] 07/02/2017

## 2017-07-02 NOTE — Progress Notes (Signed)
Trial buspirone for agitation.   From E-mail with family member/pharmacist Dr. Elie Confer  "Hi Dr. Yong Channel:  Need your advice. MF has become increasingly paranoid, confrontational, impulsive and agitated. [name] had her seen in office yesterday to make sure she did not have a UTI that may be causing this acutely-but to be honest, it has been coming on over the past couple of weeks. Caregiver this AM called in tears over the way MF was talking to her. She has similarly talked like that to [name] and me. She told the caregiver ".you are a spy". She has walked away at least once from the home and caregiver was able to find her before she got too far. We have in our home-medication that was brought back (provided to MF at discharge-since she had paid for it) after her stay at [facility]-prescribed by the geriatrician there who had consulted a psychiatrist for the facility-who they both wrote for/ordered "Buspar 5mg  1 tablet PO q12h PRN". Sixty tablets were written for. There are 52 remaining in the dispensing pack.   Could we give one of these to see if it will help calm her from her agitation? If you are not comfortable allowing [place] to give her one of the tablets (written by  Dr. Mady Gemma and dispensed 04/11/17) we will make an appointment for you to evaluate her.  "

## 2017-07-08 ENCOUNTER — Ambulatory Visit: Payer: Medicare Other | Admitting: Neurology

## 2017-07-09 ENCOUNTER — Other Ambulatory Visit: Payer: Self-pay | Admitting: Family Medicine

## 2017-07-19 ENCOUNTER — Ambulatory Visit (HOSPITAL_COMMUNITY)
Admission: RE | Admit: 2017-07-19 | Discharge: 2017-07-19 | Disposition: A | Discharge status: Home / Self Care | Payer: Medicare Other | Source: Ambulatory Visit | Attending: Interventional Radiology | Admitting: Interventional Radiology

## 2017-07-19 ENCOUNTER — Ambulatory Visit
Admission: RE | Admit: 2017-07-19 | Discharge: 2017-07-19 | Disposition: A | Discharge status: Home / Self Care | Payer: Medicare Other | Source: Ambulatory Visit | Attending: Interventional Radiology | Admitting: Interventional Radiology

## 2017-07-19 ENCOUNTER — Encounter (HOSPITAL_COMMUNITY): Payer: Self-pay

## 2017-07-19 DIAGNOSIS — N2889 Other specified disorders of kidney and ureter: Secondary | ICD-10-CM

## 2017-07-19 HISTORY — PX: IR RADIOLOGIST EVAL & MGMT: IMG5224

## 2017-07-19 MED ORDER — IOHEXOL 300 MG/ML  SOLN
100.0000 mL | Freq: Once | INTRAMUSCULAR | Status: AC | PRN
  Administered 2017-07-19: 100 mL via INTRAVENOUS

## 2017-07-19 NOTE — Progress Notes (Signed)
Chief Complaint: Oncocytic neoplasty of the kidney  Referring Physician(s): Marin Olp  Supervising Physician: Aletta Edouard  History of Present Illness: [name] [name] is a 84 y.o. female who had a CT of the abdomen and pelvis performed on 12/30/2016 for evaluation of right sided abdominal pain.   This revealed an incidental finding of a roughly 3.2 cm enhancing mass of the posterior left lower kidney.   CT on 01/11/2017 at [facility] demonstrated no evidence of metastatic disease in the chest.   The mass measured approximately 3.4cm in maximum diameter.   The mass was largely endophytic with deep margin nearly abutting the collecting system.   No evidence of renal vein tumor, local lymphadenopathy or metastatic disease in the abdomen. No other suspicious renal lesions are identified bilaterally.   At the time of initial consult with Dr. Kathlene Cote on 02/08/2017, [name] [name] was asymptomatic with respect to the renal mass. She did have some hematuria on urinalysis approximately 1 year ago.  Given her significant cardiac history of heart block, prior defibrillator placement, anticoagulation for atrialfibrillation and coronary artery disease with priorcoronarystent placement, there was indication for minimally invasive treatment of the renal mass.  She underwent biopsy of the mass and cryoablation by Dr. Kathlene Cote on 03/28/2017.  Her stay was complicated by development of a hematoma despite holding Eliquis the appropriate time.  She developed symptomatic anemia from this and her hospital stay was longer than anticipated.  Pathology revealed oncocytic neoplasm.  She is here today for 3 month post procedure follow up.  She had a CT scan today = Decreased size of left renal cryoablation defect. No evidence of residual renal neoplasm, or abdominal metastatic disease. Resolution of perinephric hemorrhage.  Hgb is up to 12 now. Creatinine is  normal.   Past Medical History:  Diagnosis Date  . Atrial fibrillation ([place]) 10/06/2007   post op  . COPD (chronic obstructive pulmonary disease) ([place])    Emphysema on 02/03/13 CXR  . CORONARY ARTERY DISEASE 10/06/2007   a. s/p PCI to LAD; b. s/p CABG; c. LHC 07/13/12: Proximal LAD stent patent, LIMA-LAD atretic, D1 occluded, proximal circumflex 30%, mid RCA occluded, SVG-D1 normal, SVG-OM2 normal, SVG-distal RCA normal, EF 40% with diffuse HK  . Dizziness   . History of colonic polyps 10/30/2009   No polyps in 2011. No repeat.    Marland Kitchen HYPERLIPIDEMIA 10/06/2007  . HYPERTENSION 10/06/2007  . LBBB 09/06/2008  . Left renal mass   . MELANOMA 09/06/2008   MOES PROCEDURE RIGHT  . MYOCARDIAL INFARCTION, HX OF 10/06/2007  . NICM (nonischemic cardiomyopathy) ([place])    Echocardiogram 07/12/12: EF 25-30%, diffuse HK, mild AI, mild MR, mild LAE  . PERSONAL HX COLONIC POLYPS 10/30/2009  . Presence of permanent cardiac pacemaker   . Syncope   . UTERINE CANCER, HX OF 09/06/2008  . VARICOSE VEIN 09/29/2009    Past Surgical History:  Procedure Laterality Date  . ABDOMINAL HYSTERECTOMY  1988  . BI-VENTRICULAR PACEMAKER INSERTION (CRT-P)  02/02/2013   St. Jude, serial no. #[id number]   . CARDIAC CATHETERIZATION  08/30/2008  . CORONARY ANGIOPLASTY WITH STENT PLACEMENT  2009  . CORONARY ARTERY BYPASS GRAFT  2004  . IR RADIOLOGIST EVAL & MGMT  02/08/2017  . IR RADIOLOGIST EVAL & MGMT  05/26/2017  . LEFT HEART CATHETERIZATION WITH CORONARY ANGIOGRAM Bilateral 07/13/2012   Procedure: LEFT HEART CATHETERIZATION WITH CORONARY ANGIOGRAM;  Surgeon: Peter M Martinique, MD;  [facility] C[facility] LAB;  Service: Cardiovascular;  Laterality: Bilateral;  .  OOPHORECTOMY    . PERMANENT PACEMAKER INSERTION N/A 02/02/2013   Procedure: PERMANENT PACEMAKER INSERTION;  Surgeon: Evans Lance, MD;  Location[facility] CATH LAB;  Service: Cardiovascular;  Laterality: N/A;  . RADIOLOGY WITH ANESTHESIA Left 03/28/2017   Procedure: RENAL CRYO ABLATION;   Surgeon: Aletta Edouard, MD;  Location: [facility] ORS;  Service: Radiology;  Laterality: Left;  . right distal pretibeal     melanoma    Allergies: Aricept [donepezil] and Nitroglycerin  Medications: Prior to Admission medications   Medication Sig Start Date End Date Taking? Authorizing Provider  busPIRone (BUSPAR) 5 MG tablet Take 1 tablet (5 mg total) by mouth 2 (two) times daily as needed (agitation). 07/02/17   Marin Olp, MD  diclofenac sodium (VOLTAREN) 1 % GEL Apply topically to affected area qid 06/02/17   Gerda Diss, DO  ELIQUIS 5 MG TABS tablet TAKE 1 TABLET(5 MG) BY MOUTH TWICE DAILY 05/23/17   Deboraha Sprang, MD  escitalopram (LEXAPRO) 20 MG tablet Take 20 mg by mouth daily.    [provider]  lisinopril (PRINIVIL,ZESTRIL) 2.5 MG tablet TAKE 1 TABLET(2.5 MG) BY MOUTH DAILY 06/07/17   Deboraha Sprang, MD  MYRBETRIQ 25 MG TB24 tablet TAKE 1 TABLET(25 MG) BY MOUTH DAILY 07/11/17   Marin Olp, MD  pyridostigmine (MESTINON) 60 MG tablet Take 60 mg by mouth daily with breakfast. .    [provider]     Family History  Problem Relation Age of Onset  . Stroke Mother   . Heart attack Father   . Colon cancer Neg Hx     Social History   Socioeconomic History  . Marital status: Widowed    Spouse name: Not on file  . Number of children: Not on file  . Years of education: Not on file  . Highest education level: Not on file  Occupational History  . Not on file  Social Needs  . Financial resource strain: Not on file  . Food insecurity:    Worry: Not on file    Inability: Not on file  . Transportation needs:    Medical: Not on file    Non-medical: Not on file  Tobacco Use  . Smoking status: Never Smoker  . Smokeless tobacco: Never Used  Substance and Sexual Activity  . Alcohol use: Yes    Comment:  OCC gin or wine  . Drug use: No  . Sexual activity: Not on file  Lifestyle  . Physical activity:    Days per week: Not on file    Minutes  per session: Not on file  . Stress: Not at all  Relationships  . Social connections:    Talks on phone: Not on file    Gets together: Not on file    Attends religious service: Not on file    Active member of club or organization: Not on file    Attends meetings of clubs or organizations: Not on file    Relationship status: Not on file  Other Topics Concern  . Not on file  Social History Narrative   Widowed. Lives alone with new cat as of 2018. 3 children. 6 grandkids   Daughter  (NP) and son in Sports coach (pharmacist-teaches at [employer]) that live close and help out.       Drives in the daytime, shops for herself, cleaning-has someone over to clean due to the intermittent lightheadedness, bathing      Retired from physical education.  Review of Systems: A 12 point ROS discussed and pertinent positives are indicated in the HPI above.  All other systems are negative. Review of Systems  Vital Signs: BP 125/63 (BP Location: Right Arm, Patient Position: Sitting, Cuff Size: Normal)   Pulse 74   Temp 98.6 F (37 C)   Resp 16   SpO2 97%   Physical Exam  Constitutional: She is oriented to person, place, and time.  Elderly, NAD  HENT:  Head: Normocephalic and atraumatic.  Eyes: EOM are normal.  Neck: Normal range of motion.  Cardiovascular: Normal rate, regular rhythm and normal heart sounds.  Pulmonary/Chest: Effort normal and breath sounds normal.  Abdominal: Soft. She exhibits no distension. There is no tenderness.  Musculoskeletal: Normal range of motion.  Neurological: She is alert and oriented to person, place, and time.  Skin: Skin is warm and dry.  Psychiatric: She has a normal mood and affect. Her behavior is normal. Judgment and thought content normal.  Vitals reviewed.   Imaging: Ct Abdomen W Wo Contrast  Result Date: 07/19/2017 CLINICAL DATA:  Followup left renal neoplasm status post cryoablation. EXAM: CT ABDOMEN WITHOUT AND WITH CONTRAST TECHNIQUE: Multidetector  CT imaging of the abdomen was performed following the standard protocol before and following the bolus administration of intravenous contrast. CONTRAST:  127mL OMNIPAQUE IOHEXOL 300 MG/ML  SOLN COMPARISON:  04/04/2017 FINDINGS: Lower chest: No acute findings. Hepatobiliary: No hepatic masses identified. Gallbladder is unremarkable. Pancreas:  No mass or inflammatory changes. Spleen:  Within normal limits in size and appearance. Adrenals/Urinary Tract: Normal appearance of adrenal glands and right kidney. Decrease size of left renal cryoablation defect since previous study, as well as resolution of perinephric hemorrhage. No residual enhancing mass seen involving the left kidney. No evidence of hydronephrosis. Stomach/Bowel: Visualized portion unremarkable. Vascular/Lymphatic: No pathologically enlarged lymph nodes identified. No abdominal aortic aneurysm. Aortic atherosclerosis. Other:  None. Musculoskeletal:  No suspicious bone lesions identified. IMPRESSION: Decreased size of left renal cryoablation defect. No evidence of residual renal neoplasm, or abdominal metastatic disease. Electronically Signed   By: Earle Gell M.D.   On: 07/19/2017 12:18    Labs:  CBC: Recent Labs    04/05/17 0547 05/04/17 1058 05/24/17 1659 07/01/17 1141  WBC 8.1 3.4* 4.6 6.4  HGB 8.2* 9.9* 10.0* 12.0  HCT 24.5* 30.5* 30.9* 37.9  PLT 178 190.0 159 139.0*    COAGS: Recent Labs    03/22/17 1217 03/30/17 0150  INR 1.10 1.10  APTT  --  26    BMP: Recent Labs    04/05/17 0401 04/05/17 0547 04/06/17 0340 05/04/17 1058 05/24/17 1659 07/01/17 1141  NA 135 134* 134* 140 137 140  K 3.2* 3.4* 3.5 3.9 3.8 4.3  CL 101 100* 101 103 101 102  CO2 25 26 26 27 29 30   GLUCOSE 106* 109* 116* 103* 121 83  BUN 12 12 11 14 16 15   CALCIUM 7.9* 7.7* 8.0* 9.0 8.7 9.3  CREATININE 0.69 0.66 0.61 0.68 0.79 0.84  GFRNONAA >60 >60 >60  --  69  --   GFRAA >60 >60 >60  --  80  --     LIVER FUNCTION TESTS: Recent Labs     05/04/17 1058 07/01/17 1141  BILITOT 0.7 0.6  AST 11 15  ALT 8 9  ALKPHOS 68 77  PROT 6.2 6.1  ALBUMIN 3.4* 3.8    TUMOR MARKERS: No results for input(s): AFPTM, CEA, CA199, CHROMGRNA in the last 8760 hours.  Assessment:  Oncocytic  neoplasm.  S/P Cryoablation by Dr. Kathlene Cote on 39/76/7341 complicated by development of hematoma.  CT scan done today reveals decreased size of left renal cryoablation defect. No evidence of residual renal neoplasm, or abdominal metastatic disease. Resolution of perinephric hemorrhage.  Return in December with repeat CT scan which will be one year from initial treatment.   Electronically Signed: Murrell Redden PA-C 07/19/2017, 4:03 PM   Please refer to Dr. Margaretmary Dys attestation of this note for management and plan.

## 2017-08-11 ENCOUNTER — Encounter: Payer: Medicare Other | Admitting: *Deleted

## 2017-08-11 ENCOUNTER — Telehealth: Payer: Self-pay | Admitting: Cardiology

## 2017-08-11 NOTE — Telephone Encounter (Signed)
LMOVM reminding pt to send remote transmission.   

## 2017-08-13 ENCOUNTER — Other Ambulatory Visit: Payer: Self-pay | Admitting: Internal Medicine

## 2017-08-15 NOTE — Telephone Encounter (Signed)
Age > 71, weight now < 60kg, normal renal function. Pt qualifies for 2.5mg  BID dosing. LMOM for patient.

## 2017-08-16 NOTE — Telephone Encounter (Signed)
Spoke with daughter - she will have patient weighed at home and call back tomorrow. Weight has been ~ 130 lbs and will be determining factor for Eliquis dosing.

## 2017-08-17 NOTE — Telephone Encounter (Signed)
Patient's daughter called back and reports pt weighs 132 lbs (60 kg exactly). Will continue Eliquis 5mg  BID for now and advised to monitor patient's weight - they are aware to contact clinic if she stays consistently < 60 kg for Eliquis dose reduction.

## 2017-08-18 ENCOUNTER — Encounter: Payer: Self-pay | Admitting: Cardiology

## 2017-08-21 ENCOUNTER — Other Ambulatory Visit: Payer: Self-pay | Admitting: Family Medicine

## 2017-09-21 ENCOUNTER — Encounter: Payer: Self-pay | Admitting: Cardiology

## 2017-10-27 ENCOUNTER — Encounter: Payer: Self-pay | Admitting: Family Medicine

## 2017-10-27 ENCOUNTER — Ambulatory Visit: Payer: Medicare Other | Admitting: Family Medicine

## 2017-10-27 VITALS — BP 114/60 | HR 78 | Temp 98.5°F | Ht 69.0 in | Wt 140.0 lb

## 2017-10-27 DIAGNOSIS — G3184 Mild cognitive impairment, so stated: Secondary | ICD-10-CM | POA: Diagnosis not present

## 2017-10-27 DIAGNOSIS — I10 Essential (primary) hypertension: Secondary | ICD-10-CM | POA: Diagnosis not present

## 2017-10-27 DIAGNOSIS — I480 Paroxysmal atrial fibrillation: Secondary | ICD-10-CM | POA: Diagnosis not present

## 2017-10-27 DIAGNOSIS — F325 Major depressive disorder, single episode, in full remission: Secondary | ICD-10-CM | POA: Diagnosis not present

## 2017-10-27 LAB — COMPREHENSIVE METABOLIC PANEL
ALT: 9 U/L (ref 0–35)
AST: 14 U/L (ref 0–37)
Albumin: 3.9 g/dL (ref 3.5–5.2)
Alkaline Phosphatase: 66 U/L (ref 39–117)
BILIRUBIN TOTAL: 0.4 mg/dL (ref 0.2–1.2)
BUN: 22 mg/dL (ref 6–23)
CO2: 34 meq/L — AB (ref 19–32)
Calcium: 9 mg/dL (ref 8.4–10.5)
Chloride: 106 mEq/L (ref 96–112)
Creatinine, Ser: 1 mg/dL (ref 0.40–1.20)
GFR: 56.14 mL/min — AB (ref >60.00)
GLUCOSE: 106 mg/dL — AB (ref 70–99)
Potassium: 4.2 mEq/L (ref 3.5–5.1)
Sodium: 143 mEq/L (ref 135–145)
Total Protein: 6 g/dL (ref 6.0–8.3)

## 2017-10-27 LAB — CBC
HCT: 40.2 % (ref 36.0–46.0)
HEMOGLOBIN: 13.2 g/dL (ref 12.0–15.0)
MCHC: 32.9 g/dL (ref 30.0–36.0)
MCV: 89 fl (ref 78.0–100.0)
Platelets: 154 10*3/uL (ref 150.0–400.0)
RBC: 4.51 Mil/uL (ref 3.87–5.11)
RDW: 15.7 % — AB (ref 11.5–15.5)
WBC: 5.8 10*3/uL (ref 4.0–10.5)

## 2017-10-27 NOTE — Assessment & Plan Note (Addendum)
S: patient didn't tolerate aricept due to diarrhea and hallucinations. Doesn't want to follow up at [place] because she doesn't like to be faced with her memory issues over and over.  MMSE in high 20s in the past such as 28 in 08/2015.  She has had labs including cbc, cmp, tsh, b12, folate, hiv, rpr in 2016 that were reassuring in 2016. Had reassuring MRI in 2014 and doubt new strokes/vascular dementia. Off statin in case that is contributing.  A/P: MMSE 26/30 today- loses 4 for orientation (2 for location, 2 for timing). We will continue to monitor - does not seem to have had significant progression. Thrilled she is up 14 lbs! Patient wants me to ask her daughter about possibly driving- I will reach out about possible occupational therapy evaluation.

## 2017-10-27 NOTE — Patient Instructions (Addendum)
phq9 before you leave  Please stop by lab before you go  No changes today. Will try to talk with your daughter about driving evaluation.

## 2017-10-27 NOTE — Assessment & Plan Note (Signed)
S: on really low dose lisinopril for ischemic cardiomyopathy with EF 40% in 1364 and thus systolic CHF BP Readings from Last 3 Encounters:  10/27/17 114/60  07/19/17 125/63  07/01/17 120/68  A/P: continue current rx- luckily BP not too low. CHF appears stable

## 2017-10-27 NOTE — Assessment & Plan Note (Signed)
S: reasonably controlled on lexapro 20mg . Also on buspirone 5mg  twice a day prn for agitation- not sure if they are scheduling this or just using prn- will reach out to daughter Depression screen [facility] 2/9 10/27/2017  Decreased Interest 0  Down, Depressed, Hopeless 1  PHQ - 2 Score 1  Altered sleeping 0  Tired, decreased energy 1  Change in appetite 0  Feeling bad or failure about yourself  2  Trouble concentrating 0  Moving slowly or fidgety/restless 0  Suicidal thoughts 0  PHQ-9 Score 4  Difficult doing work/chores Not difficult at all  Some recent data might be hidden  A/P: continue current rx- will get more info from daughter

## 2017-10-27 NOTE — Progress Notes (Signed)
Subjective:  [name] [name] is a 84 y.o. year old very pleasant female patient who presents for/with See problem oriented charting ROS- continued memory issues. Denies shortness of breath. No chest pain. No edema.   Past Medical History-  Patient Active Problem List   Diagnosis Date Noted  . Left renal mass 01/27/2017    Priority: High  . CAD (coronary artery disease) 04/17/2014    Priority: High  . Mild cognitive impairment 04/17/2014    Priority: High  . Pacemaker -CRT-St. Jude 05/21/2013    Priority: High  . Atrioventricular block, complete ([place]) 03/08/2013    Priority: High  . Paroxysmal atrial fibrillation (HCC) 02/03/2013    Priority: High  . Chronic combined systolic and diastolic congestive heart failure ([place]) 10/10/2012    Priority: High  . Ischemic cardiomyopathy  EF 30% cath 4/14 with stent 10/06/2007    Priority: High  . Overactive bladder 07/09/2016    Priority: Medium  . Dizziness 05/07/2016    Priority: Medium  . Microscopic hematuria 09/02/2015    Priority: Medium  . Asthma, intrinsic 02/03/2013    Priority: Medium  . Depression 09/19/2012    Priority: Medium  . History of melanoma 09/06/2008    Priority: Medium  . History of uterine cancer 09/06/2008    Priority: Medium  . Hyperlipidemia 10/06/2007    Priority: Medium  . Essential hypertension 10/06/2007    Priority: Medium  . Allergic rhinitis 04/17/2014    Priority: Low  . Hearing loss 04/17/2014    Priority: Low  . Varicose veins 09/29/2009    Priority: Low  . LBBB (left bundle branch block) 09/06/2008    Priority: Low  . Arthritis of carpometacarpal [facility]) joint of left thumb 06/02/2017  . Current use of long term anticoagulation 06/02/2017  . HCAP (healthcare-associated pneumonia) 04/04/2017  . Traumatic perinephric hematoma of left kidney 03/29/2017  . Acute blood loss as cause of postoperative anemia 03/29/2017   Medications- reviewed and updated Current Outpatient Medications  Medication  Sig Dispense Refill  . busPIRone (BUSPAR) 5 MG tablet Take 1 tablet (5 mg total) by mouth 2 (two) times daily as needed (agitation). 60 tablet 2  . ELIQUIS 5 MG TABS tablet TAKE 1 TABLET(5 MG) BY MOUTH TWICE DAILY 60 tablet 5  . escitalopram (LEXAPRO) 20 MG tablet Take 20 mg by mouth daily.    Marland Kitchen lisinopril (PRINIVIL,ZESTRIL) 2.5 MG tablet TAKE 1 TABLET(2.5 MG) BY MOUTH DAILY 90 tablet 2  . mirabegron ER (MYRBETRIQ) 25 MG TB24 tablet Take 25 mg by mouth daily.    Marland Kitchen pyridostigmine (MESTINON) 60 MG tablet Take 60 mg by mouth daily with breakfast. .     No current facility-administered medications for this visit.     Objective: BP 114/60 (BP Location: Left Arm, Patient Position: Sitting, Cuff Size: Large)   Pulse 78   Temp 98.5 F (36.9 C) (Oral)   Ht 5\' 9"  (1.753 m)   Wt 140 lb (63.5 kg)   SpO2 96%   BMI 20.67 kg/m  Gen: NAD, resting comfortably CV: RRR no murmurs rubs or gallops Lungs: CTAB no crackles, wheeze, rhonchi Abdomen: soft/nontender/nondistended/normal bowel sounds. No rebound or guarding.  Ext: no edema Skin: warm, dry  Assessment/Plan:  Notes to daughter [name] Roch: 1. Your mom's weight is up 14 lbs. No edema. I found that encouraging. 2. Mini mental status exam stable at 26/30 3. Can you confirm her medicines as listed below or update me if incorrect? Your mom is thrilled with the medication  dispenser she has.  4. Your mom would really like to drive even if it was with you in the car to allow her more of a feeling of freedom. I told her there are some occupational therapy driving places (id have to look them up) that will evaluate safety of driving and make recommendations such as only drive on 35 mph roads or less, only drive with someone in the car, only drive to 2-3 well known places (often grocery store or pharmacy)   Paroxysmal atrial fibrillation ([place]) S: patient remains on eliquis due to a fib history. Off coreg due to lightheadedness/orthostatic symptoms A/P:  continue current rx  Mild cognitive impairment S: patient didn't tolerate aricept due to diarrhea and hallucinations. Doesn't want to follow up at [place] because she doesn't like to be faced with her memory issues over and over.  MMSE in high 20s in the past such as 28 in 08/2015.  She has had labs including cbc, cmp, tsh, b12, folate, hiv, rpr in 2016 that were reassuring in 2016. Had reassuring MRI in 2014 and doubt new strokes/vascular dementia. Off statin in case that is contributing.  A/P: MMSE 26/30 today- loses 4 for orientation (2 for location, 2 for timing). We will continue to monitor - does not seem to have had significant progression. Thrilled she is up 14 lbs! Patient wants me to ask her daughter about possibly driving- I will reach out about possible occupational therapy evaluation.   Essential hypertension S: on really low dose lisinopril for ischemic cardiomyopathy with EF 40% in 5093 and thus systolic CHF BP Readings from Last 3 Encounters:  10/27/17 114/60  07/19/17 125/63  07/01/17 120/68  A/P: continue current rx- luckily BP not too low. CHF appears stable  Depression S: reasonably controlled on lexapro 20mg . Also on buspirone 5mg  twice a day prn for agitation- not sure if they are scheduling this or just using prn- will reach out to daughter Depression screen [facility] 2/9 10/27/2017  Decreased Interest 0  Down, Depressed, Hopeless 1  PHQ - 2 Score 1  Altered sleeping 0  Tired, decreased energy 1  Change in appetite 0  Feeling bad or failure about yourself  2  Trouble concentrating 0  Moving slowly or fidgety/restless 0  Suicidal thoughts 0  PHQ-9 Score 4  Difficult doing work/chores Not difficult at all  Some recent data might be hidden  A/P: continue current rx- will get more info from daughter   Future Appointments  Date Time Provider [place]  11/16/2017  9:30 AM Gardiner Barefoot, DPM [facility]  12/02/2017  3:00 PM Narda Amber K, DO  [facility] None   Lab/Order associations: Paroxysmal atrial fibrillation ([place]) - Plan: CBC, Comprehensive metabolic panel  Mild cognitive impairment  Essential hypertension  Major depressive disorder with single episode, in full remission ([place])  Return precautions advised.  Garret Reddish, MD

## 2017-10-27 NOTE — Assessment & Plan Note (Signed)
S: patient remains on eliquis due to a fib history. Off coreg due to lightheadedness/orthostatic symptoms A/P: continue current rx

## 2017-11-03 ENCOUNTER — Emergency Department (HOSPITAL_COMMUNITY): Payer: Medicare Other

## 2017-11-03 ENCOUNTER — Encounter (HOSPITAL_COMMUNITY): Payer: Self-pay | Admitting: Emergency Medicine

## 2017-11-03 ENCOUNTER — Emergency Department (HOSPITAL_COMMUNITY)
Admission: EM | Admit: 2017-11-03 | Discharge: 2017-11-03 | Disposition: A | Discharge status: Home / Self Care | Payer: Medicare Other | Attending: Emergency Medicine | Admitting: Emergency Medicine

## 2017-11-03 DIAGNOSIS — I110 Hypertensive heart disease with heart failure: Secondary | ICD-10-CM | POA: Insufficient documentation

## 2017-11-03 DIAGNOSIS — S20211A Contusion of right front wall of thorax, initial encounter: Secondary | ICD-10-CM | POA: Diagnosis not present

## 2017-11-03 DIAGNOSIS — Z8541 Personal history of malignant neoplasm of cervix uteri: Secondary | ICD-10-CM | POA: Diagnosis not present

## 2017-11-03 DIAGNOSIS — Y9301 Activity, walking, marching and hiking: Secondary | ICD-10-CM | POA: Diagnosis not present

## 2017-11-03 DIAGNOSIS — Z7901 Long term (current) use of anticoagulants: Secondary | ICD-10-CM | POA: Diagnosis not present

## 2017-11-03 DIAGNOSIS — I2510 Atherosclerotic heart disease of native coronary artery without angina pectoris: Secondary | ICD-10-CM | POA: Diagnosis not present

## 2017-11-03 DIAGNOSIS — I5042 Chronic combined systolic (congestive) and diastolic (congestive) heart failure: Secondary | ICD-10-CM | POA: Insufficient documentation

## 2017-11-03 DIAGNOSIS — Z85820 Personal history of malignant melanoma of skin: Secondary | ICD-10-CM | POA: Insufficient documentation

## 2017-11-03 DIAGNOSIS — W19XXXA Unspecified fall, initial encounter: Secondary | ICD-10-CM

## 2017-11-03 DIAGNOSIS — J449 Chronic obstructive pulmonary disease, unspecified: Secondary | ICD-10-CM | POA: Diagnosis not present

## 2017-11-03 DIAGNOSIS — Z950 Presence of cardiac pacemaker: Secondary | ICD-10-CM | POA: Diagnosis not present

## 2017-11-03 DIAGNOSIS — W010XXA Fall on same level from slipping, tripping and stumbling without subsequent striking against object, initial encounter: Secondary | ICD-10-CM | POA: Insufficient documentation

## 2017-11-03 DIAGNOSIS — Y998 Other external cause status: Secondary | ICD-10-CM | POA: Diagnosis not present

## 2017-11-03 DIAGNOSIS — S299XXA Unspecified injury of thorax, initial encounter: Secondary | ICD-10-CM | POA: Diagnosis present

## 2017-11-03 DIAGNOSIS — Y92008 Other place in unspecified non-institutional (private) residence as the place of occurrence of the external cause: Secondary | ICD-10-CM | POA: Diagnosis not present

## 2017-11-03 MED ORDER — HYDROCODONE-ACETAMINOPHEN 5-325 MG PO TABS
1.0000 | ORAL_TABLET | Freq: Once | ORAL | Status: AC
Start: 2017-11-03 — End: 2017-11-03
  Administered 2017-11-03: 1 via ORAL
  Filled 2017-11-03: qty 1

## 2017-11-03 MED ORDER — HYDROCODONE-ACETAMINOPHEN 5-325 MG PO TABS: 1.0000 | ORAL_TABLET | ORAL | 0 refills | Status: DC | PRN

## 2017-11-03 NOTE — ED Notes (Signed)
Pt demonstrated appropriate use of incentive spirometer. 

## 2017-11-03 NOTE — ED Notes (Signed)
Pt was able to ambulate in hallway with minimal assistance.

## 2017-11-03 NOTE — ED Provider Notes (Signed)
[facility] EMERGENCY DEPARTMENT Provider Note   CSN: [id number] Arrival date & time: 11/03/17  2103     History   Chief Complaint Chief Complaint  Patient presents with  . Fall    HPI [name] [name] is a 84 y.o. female.  Pt presents to the ED today with right sided pain after a fall.  Pt said she was in her garden shed and tripped and fell.  She hurt the right side of her chest.  She denies hitting her head or having a loc.  The pt does have a hx of a.fib and is on Eliquis.      Past Medical History:  Diagnosis Date  . Atrial fibrillation ([place]) 10/06/2007   post op  . COPD (chronic obstructive pulmonary disease) ([place])    Emphysema on 02/03/13 CXR  . CORONARY ARTERY DISEASE 10/06/2007   a. s/p PCI to LAD; b. s/p CABG; c. LHC 07/13/12: Proximal LAD stent patent, LIMA-LAD atretic, D1 occluded, proximal circumflex 30%, mid RCA occluded, SVG-D1 normal, SVG-OM2 normal, SVG-distal RCA normal, EF 40% with diffuse HK  . Dizziness   . History of colonic polyps 10/30/2009   No polyps in 2011. No repeat.    Marland Kitchen HYPERLIPIDEMIA 10/06/2007  . HYPERTENSION 10/06/2007  . LBBB 09/06/2008  . Left renal mass   . MELANOMA 09/06/2008   MOES PROCEDURE RIGHT  . MYOCARDIAL INFARCTION, HX OF 10/06/2007  . NICM (nonischemic cardiomyopathy) ([place])    Echocardiogram 07/12/12: EF 25-30%, diffuse HK, mild AI, mild MR, mild LAE  . PERSONAL HX COLONIC POLYPS 10/30/2009  . Presence of permanent cardiac pacemaker   . Syncope   . UTERINE CANCER, HX OF 09/06/2008  . VARICOSE VEIN 09/29/2009    Patient Active Problem List   Diagnosis Date Noted  . Arthritis of carpometacarpal [facility]) joint of left thumb 06/02/2017  . Current use of long term anticoagulation 06/02/2017  . HCAP (healthcare-associated pneumonia) 04/04/2017  . Traumatic perinephric hematoma of left kidney 03/29/2017  . Acute blood loss as cause of postoperative anemia 03/29/2017  . Left renal mass 01/27/2017  . Overactive bladder  07/09/2016  . Dizziness 05/07/2016  . Microscopic hematuria 09/02/2015  . CAD (coronary artery disease) 04/17/2014  . Allergic rhinitis 04/17/2014  . Mild cognitive impairment 04/17/2014  . Hearing loss 04/17/2014  . Pacemaker -CRT-St. Jude 05/21/2013  . Atrioventricular block, complete ([place]) 03/08/2013  . Paroxysmal atrial fibrillation ([place]) 02/03/2013  . Asthma, intrinsic 02/03/2013  . Chronic combined systolic and diastolic congestive heart failure ([place]) 10/10/2012  . Depression 09/19/2012  . Varicose veins 09/29/2009  . History of melanoma 09/06/2008  . LBBB (left bundle branch block) 09/06/2008  . History of uterine cancer 09/06/2008  . Hyperlipidemia 10/06/2007  . Essential hypertension 10/06/2007  . Ischemic cardiomyopathy  EF 30% cath 4/14 with stent 10/06/2007    Past Surgical History:  Procedure Laterality Date  . ABDOMINAL HYSTERECTOMY  1988  . BI-VENTRICULAR PACEMAKER INSERTION (CRT-P)  02/02/2013   St. Jude, serial no. #[id number]   . CARDIAC CATHETERIZATION  08/30/2008  . CORONARY ANGIOPLASTY WITH STENT PLACEMENT  2009  . CORONARY ARTERY BYPASS GRAFT  2004  . IR RADIOLOGIST EVAL & MGMT  02/08/2017  . IR RADIOLOGIST EVAL & MGMT  05/26/2017  . IR RADIOLOGIST EVAL & MGMT  07/19/2017  . LEFT HEART CATHETERIZATION WITH CORONARY ANGIOGRAM Bilateral 07/13/2012   Procedure: LEFT HEART CATHETERIZATION WITH CORONARY ANGIOGRAM;  Surgeon: Peter M Martinique, MD;  Location: [facility] CATH LAB;  Service: Cardiovascular;  Laterality: Bilateral;  . OOPHORECTOMY    . PERMANENT PACEMAKER INSERTION N/A 02/02/2013   Procedure: PERMANENT PACEMAKER INSERTION;  Surgeon: Evans Lance, MD;  Location: [facility] CATH LAB;  Service: Cardiovascular;  Laterality: N/A;  . RADIOLOGY WITH ANESTHESIA Left 03/28/2017   Procedure: RENAL CRYO ABLATION;  Surgeon: Aletta Edouard, MD;  Location: [facility] ORS;  Service: Radiology;  Laterality: Left;  . right distal pretibeal     melanoma     OB History   None      Home  Medications    Prior to Admission medications   Medication Sig Start Date End Date Taking? Authorizing Provider  busPIRone (BUSPAR) 5 MG tablet Take 1 tablet (5 mg total) by mouth 2 (two) times daily as needed (agitation). 07/02/17   Marin Olp, MD  ELIQUIS 5 MG TABS tablet TAKE 1 TABLET(5 MG) BY MOUTH TWICE DAILY 08/17/17   Deboraha Sprang, MD  escitalopram (LEXAPRO) 20 MG tablet Take 20 mg by mouth daily.    [provider]  HYDROcodone-acetaminophen (NORCO/VICODIN) 5-325 MG tablet Take 1 tablet by mouth every 4 (four) hours as needed. 11/03/17   Isla Pence, MD  lisinopril (PRINIVIL,ZESTRIL) 2.5 MG tablet TAKE 1 TABLET(2.5 MG) BY MOUTH DAILY 06/07/17   Deboraha Sprang, MD  mirabegron ER (MYRBETRIQ) 25 MG TB24 tablet Take 25 mg by mouth daily.    [provider]  pyridostigmine (MESTINON) 60 MG tablet Take 60 mg by mouth daily with breakfast. .    [provider]    Family History Family History  Problem Relation Age of Onset  . Stroke Mother   . Heart attack Father   . Colon cancer Neg Hx     Social History Social History   Tobacco Use  . Smoking status: Never Smoker  . Smokeless tobacco: Never Used  Substance Use Topics  . Alcohol use: Yes    Comment:  OCC gin or wine  . Drug use: No     Allergies   Aricept [donepezil] and Nitroglycerin   Review of Systems Review of Systems  Musculoskeletal:       Right sided chest wall pain  All other systems reviewed and are negative.    Physical Exam Updated Vital Signs BP 127/65 (BP Location: Left Arm)   Pulse 65   Temp 98.4 F (36.9 C) (Oral)   Resp 16   SpO2 98%   Physical Exam  Constitutional: She is oriented to person, place, and time. She appears well-developed and well-nourished.  HENT:  Head: Normocephalic and atraumatic.  Right Ear: External ear normal.  Left Ear: External ear normal.  Nose: Nose normal.  Mouth/Throat: Oropharynx is clear and moist.  Eyes: Pupils are  equal, round, and reactive to light. Conjunctivae and EOM are normal.  Neck: Normal range of motion. Neck supple.  Cardiovascular: Normal rate, regular rhythm, normal heart sounds and intact distal pulses.  Pulmonary/Chest: Effort normal and breath sounds normal.  Abdominal: Soft. Bowel sounds are normal.  Musculoskeletal:       Arms: Neurological: She is alert and oriented to person, place, and time.  Skin: Skin is warm. Capillary refill takes less than 2 seconds.  Psychiatric: She has a normal mood and affect. Her behavior is normal. Judgment and thought content normal.  Nursing note and vitals reviewed.    ED Treatments / Results  Labs (all labs ordered are listed, but only abnormal results are displayed) Labs Reviewed - No data to display  EKG None  Radiology Dg Ribs Unilateral W/chest Right  Result Date: 11/03/2017 CLINICAL DATA:  Fall EXAM: RIGHT RIBS AND CHEST - 3+ VIEW COMPARISON:  04/06/2017 FINDINGS: Mild cardiomegaly. Left subclavian pacemaker device and leads are stable and intact. Lungs are clear. No pneumothorax or pleural effusion. No definite acute rib fracture.  Osteopenia is noted. IMPRESSION: Cardiomegaly without decompensation. No evidence of acute rib fracture. Electronically Signed   By: Marybelle Killings M.D.   On: 11/03/2017 21:49   Dg Pelvis 1-2 Views  Result Date: 11/03/2017 CLINICAL DATA:  Pain.  Fall. EXAM: PELVIS - 1-2 VIEW COMPARISON:  None. FINDINGS: No acute fracture. No dislocation. Surgical staples project over the pelvis. IMPRESSION: No acute bony pathology. Electronically Signed   By: Marybelle Killings M.D.   On: 11/03/2017 21:42    Procedures Procedures (including critical care time)  Medications Ordered in ED Medications  HYDROcodone-acetaminophen (NORCO/VICODIN) 5-325 MG per tablet 1 tablet (1 tablet Oral Given 11/03/17 2220)     Initial Impression / Assessment and Plan / ED Course  I have reviewed the triage vital signs and the nursing  notes.  Pertinent labs & imaging results that were available during my care of the patient were reviewed by me and considered in my medical decision making (see chart for details).     Pt was able to ambulate.  The only difficulty was when she first gets up.  She did finally accept some pain meds, so was given lortab.  The pt was also given an incentive spirometer in case she has an occult rib fx.  She knows to return if worse.  Final Clinical Impressions(s) / ED Diagnoses   Final diagnoses:  Rib contusion, right, initial encounter  Fall, initial encounter    ED Discharge Orders        Ordered    HYDROcodone-acetaminophen (NORCO/VICODIN) 5-325 MG tablet  Every 4 hours PRN     11/03/17 2219       Isla Pence, MD 11/03/17 2228

## 2017-11-03 NOTE — ED Triage Notes (Signed)
Pt BIB EMS, had a mechanical fall tonight. C/o right side pain, denies hitting her head or LOC. Per family, pt has "memory issues", A&O x 4 at this time. EMS VSS.

## 2017-11-03 NOTE — ED Notes (Signed)
Patient transported to X-ray 

## 2017-11-14 ENCOUNTER — Other Ambulatory Visit: Payer: Self-pay | Admitting: Family Medicine

## 2017-11-16 ENCOUNTER — Encounter: Payer: Self-pay | Admitting: Podiatry

## 2017-11-16 ENCOUNTER — Ambulatory Visit (INDEPENDENT_AMBULATORY_CARE_PROVIDER_SITE_OTHER): Payer: Medicare Other | Admitting: Podiatry

## 2017-11-16 ENCOUNTER — Encounter

## 2017-11-16 DIAGNOSIS — Q828 Other specified congenital malformations of skin: Secondary | ICD-10-CM | POA: Diagnosis not present

## 2017-11-16 DIAGNOSIS — B351 Tinea unguium: Secondary | ICD-10-CM

## 2017-11-16 DIAGNOSIS — D689 Coagulation defect, unspecified: Secondary | ICD-10-CM

## 2017-11-16 DIAGNOSIS — M79676 Pain in unspecified toe(s): Secondary | ICD-10-CM

## 2017-11-16 NOTE — Progress Notes (Signed)
Patient ID: [name] [name], female   DOB: May 31, 1931, 84 y.o.   MRN: [id number] Complaint:  Visit Type: Patient returns to my office for continued preventative foot care services. Complaint: Patient states" my nails have grown long and thick and become painful to walk and wear shoes" . The patient presents for preventative foot care services. No changes to ROS.  Patient says the callus on her right forefoot is extremely painful when walking.  Podiatric Exam: Vascular: dorsalis pedis and posterior tibial pulses are palpable bilateral. Capillary return is immediate. Temperature gradient is WNL. Skin turgor WNL  Sensorium: Normal Semmes Weinstein monofilament test. Normal tactile sensation bilaterally. Nail Exam: Pt has thick disfigured discolored nails with subungual debris noted bilateral entire nail hallux through fifth toenails Ulcer Exam: There is no evidence of ulcer or pre-ulcerative changes or infection. Orthopedic Exam: Muscle tone and strength are WNL. No limitations in general ROM. No crepitus or effusions noted. Foot type and digits show no abnormalities. Plantarflexed third metatarsal right foot. Skin:  Porokeratosis sub 3 right. No infection or ulcers  Diagnosis:  Onychomycosis, , Pain in right toe, pain in left toes  Debride porokeratosis  Treatment & Plan Procedures and Treatment: Consent by patient was obtained for treatment procedures. The patient understood the discussion of treatment and procedures well. All questions were answered thoroughly reviewed. Debridement of mycotic and hypertrophic toenails, 1 through 5 bilateral and clearing of subungual debris. No ulceration, no infection noted.  Padding applied to her right shoe. Return Visit-Office Procedure: Patient instructed to return to the office for a follow up visit 3 months for continued evaluation and treatment.    Gardiner Barefoot DPM

## 2017-12-02 ENCOUNTER — Ambulatory Visit: Payer: Medicare Other | Admitting: Neurology

## 2017-12-02 ENCOUNTER — Ambulatory Visit (INDEPENDENT_AMBULATORY_CARE_PROVIDER_SITE_OTHER): Payer: Medicare Other | Admitting: Neurology

## 2017-12-02 ENCOUNTER — Encounter: Payer: Self-pay | Admitting: Neurology

## 2017-12-02 VITALS — BP 100/68 | HR 70 | Ht 69.0 in | Wt 142.5 lb

## 2017-12-02 DIAGNOSIS — R42 Dizziness and giddiness: Secondary | ICD-10-CM | POA: Diagnosis not present

## 2017-12-02 DIAGNOSIS — F0280 Dementia in other diseases classified elsewhere without behavioral disturbance: Secondary | ICD-10-CM | POA: Diagnosis not present

## 2017-12-02 DIAGNOSIS — G309 Alzheimer's disease, unspecified: Secondary | ICD-10-CM

## 2017-12-02 NOTE — Addendum Note (Signed)
Addended by: Chester Holstein on: 12/02/2017 01:12 PM   Modules accepted: Orders

## 2017-12-02 NOTE — Progress Notes (Signed)
Follow-up Visit   Date: 01/06/2017    [name] [name] MRN: [id number] DOB: 1932/01/04   Interim History: [name] [name] is a 84 y.o. right-handed Caucasian female with PAF s/p PPM (on Eliquis), CAD s/p PCI, NICM EF 35%, hypertension, mild cognitive impairment, remote history of melanoma, and hyperlipidemia returning to the clinic for follow-up of orthostatic lightheadedness.  The patient was accompanied to the clinic by daughter who also provides collateral information.    History of present illness: Starting in 2014, she began having spells of lightheadedness, described as unsteadiness.  She denies dizziness with room spinning.  Review of Epic notes shows that she had a syncopal spell in March 2014 after getting up from the breakfast table to walk to the bathroom.  This lasted a few seconds. She had another episode a couple of days later.  A Holter for 30 days showed occasional SVT. During another episode of severe dizziness, a PPM was placed because she was found to have heart block (2 to 1 block) in October 2014.  Following the PPM, the patient improved slightly, but continued to have lightheadedness especially with prolonged standing. She tried midodrine 2.5mg  before PPM placement and that did not help. She saw Dr. Debria Garret at [facility] neurology who started her on mestinon 30mg  TID and cannot tell whether it helped or not.    On December 23rd, she had another spell of lightheadedness and she activated her medical alert system. She did not loose consciousness.  She felt confused.  EMS arrived and transported her to the emergency deparment where she was found to be orthostatic. She continues to have lightheadedness in the morning and then improves by the evening.  She had noticed that abrupt movements exacerbates symptoms. She is noncompliant with her abdominal binder and stockings.  She had tried drinking lots of fluids, but did not find benefit.  She lives alone and reports to eating small meals.  Breakfast consists of tea and a breakfast bar, lunch is a sandwich, and dinner is microwave lean cuisine meal.  She does not cook.  She had lost 15lb over the past year unintentionally.  She is followed by Dr. Caryl Comes for her cardiac disease and orthostatic hypotension.  Notes indicate that her syncopal spells may have initially been due to high-grade heart block and orthostatic hypotension.  Due to her low blood pressure, her coreg was discontinued without marked difference in her symptoms.  She denies dry eyes, dry mouth, urinary retention, slowed movements, gait imbalance, or falls.   UPDATE 01/06/2017:  Since increasing Mestinon to 60 mg twice daily, she no longer has spells of lightheadedness, despite stopping her compression stockings. Her daughter-in-law notes progressive memory changes and patient endorses getting overwhelmed with addressing her mail and bills. She continues to live alone and manages her own IADLs and ADLs.  She has previously been tried on Aricept and Namenda, both which she did not tolerate. 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   UPDATE 06/09/2017:   She unfortunately has had a few hospitalizations she was last here. In early December, she underewnt cryoablation of left renal mass which was complicated by perinephric hematoma.  She was readmitted in December with pneumonia and stayed at rehab facility for 2 weeks.  She will be having surveillance imaging of the renal mass in the next few months, however daughter tells me that they do not wish any aggressive measures.   Since returning home, he is able to  walk unassisted and uses cane as needed.  She has daily caregiver for 10-hr daily that helps with meals, medication administration, light housekeeping, and errands. She has become increasingly forgetful over the past months, and has not recovered to her prior baseline.   UPDATE 12/02/2017:  She is here for follow-up visit  with her son from [place].  Today, she is feeling very lightheaded, especially when she exited her car to come to the appointment.  Lightheadedness has been worse over the past few days and especially with positional changes. She has mild shortness of breath, no chest pain or palpitations. BP is 100/60 today. She does not monitor this regularly at home.  She takes mestinon 60mg  twice daily and admits that she does not drink enough water, barely a few glasses daily.  Family also state that she keeps the home very warm.  Memory continues to show steady decline especially with being forgetful.  Daughter is POA and managed medications and finances.  She has a caregiver that comes 9am - 7pm to prepare meals and household chores.  There are no behavior issues or home safety concerns.  Mood and sleep is good.   Medications:  Current Outpatient Medications on File Prior to Visit  Medication Sig Dispense Refill  . atorvastatin (LIPITOR) 20 MG tablet     . busPIRone (BUSPAR) 5 MG tablet Take 1 tablet (5 mg total) by mouth 2 (two) times daily as needed (agitation). 60 tablet 2  . diclofenac sodium (VOLTAREN) 1 % GEL APP EXT AA QID  2  . donepezil (ARICEPT) 5 MG tablet Take 1 tablet (5 mg) every night for 4 weeks.  If no side effects, increase to 2 tablets (10 mg) at night.    Marland Kitchen ELIQUIS 5 MG TABS tablet TAKE 1 TABLET(5 MG) BY MOUTH TWICE DAILY 60 tablet 5  . escitalopram (LEXAPRO) 20 MG tablet TAKE 1 TABLET(20 MG) BY MOUTH DAILY 30 tablet 0  . HYDROcodone-acetaminophen (NORCO/VICODIN) 5-325 MG tablet Take 1 tablet by mouth every 4 (four) hours as needed. 10 tablet 0  . lisinopril (PRINIVIL,ZESTRIL) 2.5 MG tablet TAKE 1 TABLET(2.5 MG) BY MOUTH DAILY 90 tablet 2  . mirabegron ER (MYRBETRIQ) 25 MG TB24 tablet Take 25 mg by mouth daily.    Marland Kitchen pyridostigmine (MESTINON) 60 MG tablet Take 60 mg by mouth daily with breakfast. .     No current facility-administered medications on file prior to visit.     Allergies:    Allergies  Allergen Reactions  . Aricept [Donepezil] Shortness Of Breath and Other (See Comments)    Hallucinations and loss of sensation in legs  . Nitroglycerin Other (See Comments)    Oral spray form causes rapid drop in blood pressure.      Review of Systems:  CONSTITUTIONAL: No fevers, chills, night sweats, no weight loss.  EYES: No visual changes or eye pain ENT: No hearing changes.  No history of nose bleeds.   RESPIRATORY: No cough, wheezing and shortness of breath.   CARDIOVASCULAR: Negative for chest pain, and palpitations.   GI: Negative for abdominal discomfort, blood in stools or black stools.  No recent change in bowel habits.   GU:  No history of incontinence.   MUSCLOSKELETAL: No history of joint pain or swelling.  No myalgias.   SKIN: Negative for lesions, rash, and itching.   ENDOCRINE: Negative for cold or heat intolerance, polydipsia or goiter.   PSYCH:  No depression or anxiety symptoms.   NEURO: As Above.  Vital Signs:  BP 100/68   Pulse 70   Ht 5\' 9"  (1.753 m)   Wt 142 lb 8 oz (64.6 kg)   SpO2 96%   BMI 21.04 kg/m  Orthostatic VS for the past 24 hrs:  BP- Lying Pulse- Lying BP- Sitting Pulse- Sitting BP- Standing at 0 minutes Pulse- Standing at 0 minutes  12/02/17 1213 112/68 60 118/64 56 100/60 78    General Medical Exam:   General:  Well appearing, somewhat uncomfortable due to lightheadedness  Eyes/ENT: see cranial nerve examination.   Neck: No masses appreciated.  Full range of motion without tenderness.  No carotid bruits. Respiratory:  Clear to auscultation, good air entry bilaterally.   Cardiac:  Regular rate and rhythm, no murmur.   Ext:  No edema  Neurological Exam: MENTAL STATUS:  She is awake and correctly introduces her son.  She is aware of the place.    CRANIAL NERVES:   Pupils equal round and reactive to light.  Normal conjugate, extra-ocular eye movements in all directions of gaze.  No ptosis.  Face is symmetric. Palate elevates  symmetrically.  Tongue is midline.  MOTOR:  Motor strength is 5/5 in all extremities.  No abnormal movements. Tone is normal.    MSRs:  Reflexes are 2+/4 throughout  COORDINATION/GAIT: Gait narrow based and stable and unassisted. She is able to stand up without using arms to push off the chair.  Data:  ARS 05/09/2013: This is an abnormal study although limited because of the pacemaker. There is evidence for patchy autonomic failure with orthostatic hypotension. Clinical correlation is suggested.   NCS/EMG 05/09/2013: This is a normal study. Specifically, there is no electrophysiological evidence to suggest the presence of peripheral neuropathy on this test.   MRI brain 07/11/2012: No acute infarct. Prominent small vessel disease type changes. Global atrophy without hydrocephalus. Right frontal region sub centimeter calcified structure may represent a small meningioma and is without change. No associate mass effect.  Cervical spondylotic changes with spinal stenosis and cord flattening most prominent C4-5 level.  MRA head 07/11/2012: Left vertebral artery is diminutive in size after the takeoff of the left PICA. The right vertebral artery is dominant and ectatic. No significant stenosis of the basilar artery. Anterior circulation without medium or large size vessel significant stenosis or occlusion. Branch vessel irregularity as noted above.  Labs 04/30/2014:  HIV NR, RPR NR, folate 10.4, vitamin B12 276  US carotids 07/12/2012: - No significant extracranial carotid artery stenosisdemonstrated. Vertebrals are patent with antegrade flow. - ICA/CCA ratio: right= 1.01. left =0.88.  IMPRESSION/PLAN: 1.  Orthostatic lightheadedness, symptomatic at today's visit.  Orthostatic vital signs showed 18 point drop in SBP and she was symptomatic.  We discussed sending the patient to the ER for IV fluids, however, after drinking 2 glasses of water in the office, she was feeling and looking  better.  I have encouraged her to drink at least 8 glasses of water daily and continue mestinon 60mg  BID.  Follow BP at home and call with an update in 1 week.  If she continues to have ongoing lightheadedness, she was instructed to go to the ER for IV hydration.  If her BP remains low, may need to increase mestinon to 60mg  BID or add midodrine.    Continue to use compression stockings  2.  Alzheimer's dementia, gradually getting worse as expected with the disease. Overall, her support at home has not increased over the past 6 months. She continues to have a  caregiver from [place] and stays alone at night.  Family does not report any home safety issues.  Daughter (POA) manages finances and medication.  She did not tolerate Aricept or Namenda.   Return to clinic in 3 months  Greater than 50% of this 40 minute visit was spent in counseling, explanation of diagnosis, planning of further management, and coordination of care due to monitoring patient to be sure she is safe to go home after PO hydration.    Thank you for allowing me to participate in patient's care.  If I can answer any additional questions, I would be pleased to do so.    Sincerely,    Kaori Jumper K. Posey Pronto, DO

## 2017-12-06 ENCOUNTER — Telehealth: Payer: Self-pay | Admitting: Neurology

## 2017-12-06 ENCOUNTER — Other Ambulatory Visit: Payer: Self-pay | Admitting: *Deleted

## 2017-12-06 DIAGNOSIS — R42 Dizziness and giddiness: Secondary | ICD-10-CM

## 2017-12-06 NOTE — Telephone Encounter (Signed)
-----   Message from Magdalen Spatz, NP sent at 12/05/2017  5:23 PM EDT ----- Regarding: Blood Pressure Checks Dr. Posey Pronto, I appreciate your care of Mom on Friday when she had her episode of dizziness. I have asked her home care providers to check her blood pressure this week while I am at work. [facility] needs an order to check BP's. Would you please send them an order to do BP once daily for 1 week. I have asked them to do it at noon as this is about the time of day when her dizziness occurred. They won't do this without an order. I checked her BP this weekend and she was 120-130/70's. I am hoping this was just dehydration, but was also wondering if it may be related to the medications she takes at 9 am. We may need to adjust times if it re-occurs.   Thanks again for your help. [name]

## 2017-12-06 NOTE — Telephone Encounter (Signed)
OK to send instructions to her Home health to check BP daily x 1 week at noon.   Calton Harshfield K. Posey Pronto, DO

## 2017-12-06 NOTE — Telephone Encounter (Signed)
Order faxed to home health company.

## 2017-12-07 ENCOUNTER — Telehealth: Payer: Self-pay | Admitting: Neurology

## 2017-12-07 ENCOUNTER — Other Ambulatory Visit: Payer: Self-pay | Admitting: *Deleted

## 2017-12-07 NOTE — Telephone Encounter (Signed)
Revised order faxed

## 2017-12-07 NOTE — Telephone Encounter (Signed)
[name] [name] is needing a new order faxed over with the order saying 7 days instead of everyday. Thanks

## 2017-12-09 ENCOUNTER — Ambulatory Visit (INDEPENDENT_AMBULATORY_CARE_PROVIDER_SITE_OTHER): Payer: Medicare Other | Admitting: Family Medicine

## 2017-12-09 ENCOUNTER — Encounter: Payer: Self-pay | Admitting: Family Medicine

## 2017-12-09 ENCOUNTER — Ambulatory Visit: Payer: Self-pay | Admitting: *Deleted

## 2017-12-09 VITALS — BP 136/68 | HR 71 | Temp 98.6°F | Wt 145.6 lb

## 2017-12-09 DIAGNOSIS — I5042 Chronic combined systolic (congestive) and diastolic (congestive) heart failure: Secondary | ICD-10-CM

## 2017-12-09 DIAGNOSIS — R531 Weakness: Secondary | ICD-10-CM

## 2017-12-09 DIAGNOSIS — R42 Dizziness and giddiness: Secondary | ICD-10-CM | POA: Diagnosis not present

## 2017-12-09 DIAGNOSIS — I10 Essential (primary) hypertension: Secondary | ICD-10-CM

## 2017-12-09 LAB — POC URINALSYSI DIPSTICK (AUTOMATED)
BILIRUBIN UA: NEGATIVE
GLUCOSE UA: NEGATIVE
LEUKOCYTES UA: NEGATIVE
Nitrite, UA: NEGATIVE
Protein, UA: NEGATIVE
Spec Grav, UA: 1.025 (ref 1.010–1.025)
Urobilinogen, UA: 0.2 E.U./dL
pH, UA: 6 (ref 5.0–8.0)

## 2017-12-09 NOTE — Patient Instructions (Signed)
Increase fluids to at least 3 bottles of water a day if not 4  If not feeling better with this- we will consider along with your daughter, son in law, and possibly Dr. Caryl Comes going up on mestinon  Getting urine culture to make sure no infection but initial urine didn't look like an infection

## 2017-12-09 NOTE — Telephone Encounter (Signed)
Pt's caregiver, Cedric Fishman called that the patient is having dizziness off and on, low b/p 88/55 HR 88 and 110/59 HR 88. Saw her neurologist last Friday and recommended that she stays hydrated. She is alert and oriented. The caregiver feels like the dizziness is interfering in the patient's normal activities. Pt denies any other symptoms, no headache , chest pain, n/v, or problems seeing.  Pt's daughter is requesting an appointment with her provider today. Appointment scheduled with provider for today.  Reason for Disposition . [1] MODERATE dizziness (e.g., interferes with normal activities) AND [2] has NOT been evaluated by physician for this  (Exception: dizziness caused by heat exposure, sudden standing, or poor fluid intake)  Answer Assessment - Initial Assessment Questions 1. DESCRIPTION: "Describe your dizziness."     lightheaded 2. LIGHTHEADED: "Do you feel lightheaded?" (e.g., somewhat faint, woozy, weak upon standing)     dizzy 3. VERTIGO: "Do you feel like either you or the room is spinning or tilting?" (i.e. vertigo)     no 4. SEVERITY: "How bad is it?"  "Do you feel like you are going to faint?" "Can you stand and walk?"   - MILD - walking normally   - MODERATE - interferes with normal activities (e.g., work, school)    - SEVERE - unable to stand, requires support to walk, feels like passing out now.      mild 5. ONSET:  "When did the dizziness begin?"     A couple of weeks 6. AGGRAVATING FACTORS: "Does anything make it worse?" (e.g., standing, change in head position)     When she gets a little anxious  7. HEART RATE: "Can you tell me your heart rate?" "How many beats in 15 seconds?"  (Note: not all patients can do this)       88 8. CAUSE: "What do you think is causing the dizziness?"     Not sure 9. RECURRENT SYMPTOM: "Have you had dizziness before?" If so, ask: "When was the last time?" "What happened that time?"     no 10. OTHER SYMPTOMS: "Do you have any other  symptoms?" (e.g., fever, chest pain, vomiting, diarrhea, bleeding)       no  Protocols used: DIZZINESS [facility]

## 2017-12-09 NOTE — Progress Notes (Signed)
Subjective:  [name] [name] is a 84 y.o. year old very pleasant female patient who presents for/with See problem oriented charting ROS- no fever or chills. No chest pain or shortness of breath. Does feel dizzy with laying to sitting or sitting to standing. Denies vertigo.    Past Medical History-  Patient Active Problem List   Diagnosis Date Noted  . Left renal mass 01/27/2017    Priority: High  . CAD (coronary artery disease) 04/17/2014    Priority: High  . Mild cognitive impairment 04/17/2014    Priority: High  . Pacemaker -CRT-St. Jude 05/21/2013    Priority: High  . Atrioventricular block, complete ([place]) 03/08/2013    Priority: High  . Paroxysmal atrial fibrillation (HCC) 02/03/2013    Priority: High  . Chronic combined systolic and diastolic congestive heart failure ([place]) 10/10/2012    Priority: High  . Ischemic cardiomyopathy  EF 30% cath 4/14 with stent 10/06/2007    Priority: High  . Overactive bladder 07/09/2016    Priority: Medium  . Dizziness 05/07/2016    Priority: Medium  . Microscopic hematuria 09/02/2015    Priority: Medium  . Asthma, intrinsic 02/03/2013    Priority: Medium  . Depression 09/19/2012    Priority: Medium  . History of melanoma 09/06/2008    Priority: Medium  . History of uterine cancer 09/06/2008    Priority: Medium  . Hyperlipidemia 10/06/2007    Priority: Medium  . Essential hypertension 10/06/2007    Priority: Medium  . Allergic rhinitis 04/17/2014    Priority: Low  . Hearing loss 04/17/2014    Priority: Low  . Varicose veins 09/29/2009    Priority: Low  . LBBB (left bundle branch block) 09/06/2008    Priority: Low  . Arthritis of carpometacarpal [facility]) joint of left thumb 06/02/2017  . Current use of long term anticoagulation 06/02/2017  . HCAP (healthcare-associated pneumonia) 04/04/2017  . Traumatic perinephric hematoma of left kidney 03/29/2017  . Acute blood loss as cause of postoperative anemia 03/29/2017    Medications-  reviewed and updated Current Outpatient Medications  Medication Sig Dispense Refill  . atorvastatin (LIPITOR) 20 MG tablet     . busPIRone (BUSPAR) 5 MG tablet Take 1 tablet (5 mg total) by mouth 2 (two) times daily as needed (agitation). 60 tablet 2  . diclofenac sodium (VOLTAREN) 1 % GEL APP EXT AA QID  2  . ELIQUIS 5 MG TABS tablet TAKE 1 TABLET(5 MG) BY MOUTH TWICE DAILY 60 tablet 5  . escitalopram (LEXAPRO) 20 MG tablet TAKE 1 TABLET(20 MG) BY MOUTH DAILY 30 tablet 0  . HYDROcodone-acetaminophen (NORCO/VICODIN) 5-325 MG tablet Take 1 tablet by mouth every 4 (four) hours as needed. 10 tablet 0  . lisinopril (PRINIVIL,ZESTRIL) 2.5 MG tablet TAKE 1 TABLET(2.5 MG) BY MOUTH DAILY 90 tablet 2  . mirabegron ER (MYRBETRIQ) 25 MG TB24 tablet Take 25 mg by mouth daily.    Marland Kitchen pyridostigmine (MESTINON) 60 MG tablet Take 60 mg by mouth daily with breakfast. .     No current facility-administered medications for this visit.     Objective: BP 136/68   Pulse 71   Temp 98.6 F (37 C) (Oral)   Wt 145 lb 9.6 oz (66 kg)   SpO2 97%   BMI 21.50 kg/m  Gen: NAD, resting comfortably CV: RRR no murmurs rubs or gallops Lungs: CTAB no crackles, wheeze, rhonchi Abdomen: soft/nontender/nondistended/normal bowel sounds.  Ext: no edema Skin: warm, dry   EKG: paced rhythm with rate 64,  left axis, right bundle branch block, prolonged qrs, possible LVH. Paced rhythm- unchanged from 11/03/17 EKG other than QT interval no longer being prolonged.   Results for orders placed or performed in visit on 12/09/17 (from the past 24 hour(s))  POCT Urinalysis Dipstick (Automated)     Status: Abnormal   Collection Time: 12/09/17  4:12 PM  Result Value Ref Range   Color, UA Yellow    Clarity, UA Hazy    Glucose, UA Negative Negative   Bilirubin, UA Negative    Ketones, UA Trace    Spec Grav, UA 1.025 1.010 - 1.025   Blood, UA Small    pH, UA 6.0 5.0 - 8.0   Protein, UA Negative Negative   Urobilinogen, UA 0.2 0.2  or 1.0 E.U./dL   Nitrite, UA Negative    Leukocytes, UA Negative Negative   Assessment/Plan:  Dizzy - Plan: POCT Urinalysis Dipstick (Automated), EKG 12-Lead  Generalized weakness - Plan: EKG 12-Lead, Urinalysis, Routine w reflex microscopic, Urine Culture, Urine Culture, Urinalysis, Routine w reflex microscopic S:  Patient has had dizzy spells for 2-3 weeks where feels lightheaded with standing or going from laying to sitting. Home BPs range fro 89-144/ 55-72. BP 89/55 on the 30th and felt dizzy in particular that day.  Marland Kitchen   Has felt weaker overall in last few weeks. Trying to drink 2-3 bottles of water a day. Worse if sitting for a while.   Has yearly exam with Dr. Posey Pronto of neurology last Friday. Who advised 3-4 bottles water per day and monitor BP. Was on mestinon 60mg  BID (per med list but patient couldn't confirm dose- I spoke to daughter and believe she is on 67 in AM and 30 in PM)  A/P: Primary issue here seems to be orthostatic intolerance- we have tried to decrease mestinon over last year and could be cause.   UA unremarkable other than possible blood but repeat UA without blood. Doubt UTI but will get culture. EKG pretty stable from prior but harder to interpret given paced rhythm. Spoke with daughter by phone and explained orthostatic vital signs. Dont think we should stop lisinopril due to cardiac issues. We agreed to try to push fluid intake and if doesn't improve either increase mestinon back to prior dose or sit down and discuss with Dr. Caryl Comes before increasing.    Future Appointments  Date Time Provider [place]  02/15/2018  9:45 AM Gardiner Barefoot, DPM [facility]  04/03/2018 11:30 AM Posey Pronto, Delena Serve, DO [facility] None   Lab/Order associations: Dizzy - Plan: POCT Urinalysis Dipstick (Automated), EKG 12-Lead  Dysuria - Plan: Urinalysis, Routine w reflex microscopic, Urine Culture, Urine Culture, Urinalysis, Routine w reflex microscopic  Return precautions  advised.  Garret Reddish, MD

## 2017-12-09 NOTE — Telephone Encounter (Signed)
See note

## 2017-12-10 LAB — URINE CULTURE
MICRO NUMBER:: 91041639
SPECIMEN QUALITY: ADEQUATE

## 2017-12-10 LAB — URINALYSIS, ROUTINE W REFLEX MICROSCOPIC
Bilirubin Urine: NEGATIVE
Glucose, UA: NEGATIVE
Hgb urine dipstick: NEGATIVE
KETONES UR: NEGATIVE
Leukocytes, UA: NEGATIVE
NITRITE: NEGATIVE
PH: 5.5 (ref 5.0–8.0)
Protein, ur: NEGATIVE
Specific Gravity, Urine: 1.026 (ref 1.001–1.03)

## 2017-12-18 ENCOUNTER — Other Ambulatory Visit: Payer: Self-pay | Admitting: Family Medicine

## 2017-12-20 ENCOUNTER — Encounter: Payer: Self-pay | Admitting: Internal Medicine

## 2017-12-20 ENCOUNTER — Ambulatory Visit (INDEPENDENT_AMBULATORY_CARE_PROVIDER_SITE_OTHER): Payer: Medicare Other | Admitting: Internal Medicine

## 2017-12-20 VITALS — BP 126/68 | HR 60 | Ht 69.0 in | Wt 146.6 lb

## 2017-12-20 DIAGNOSIS — I255 Ischemic cardiomyopathy: Secondary | ICD-10-CM

## 2017-12-20 DIAGNOSIS — I951 Orthostatic hypotension: Secondary | ICD-10-CM

## 2017-12-20 DIAGNOSIS — Z950 Presence of cardiac pacemaker: Secondary | ICD-10-CM

## 2017-12-20 DIAGNOSIS — I442 Atrioventricular block, complete: Secondary | ICD-10-CM | POA: Diagnosis not present

## 2017-12-20 DIAGNOSIS — I2510 Atherosclerotic heart disease of native coronary artery without angina pectoris: Secondary | ICD-10-CM

## 2017-12-20 LAB — CUP PACEART INCLINIC DEVICE CHECK
Battery Remaining Longevity: 84 mo
Battery Voltage: 2.96 V
Date Time Interrogation Session: 20190910165455
Implantable Lead Implant Date: 20141024
Implantable Lead Location: 753858
Implantable Lead Location: 753859
Lead Channel Impedance Value: 375 Ohm
Lead Channel Impedance Value: 750 Ohm
Lead Channel Pacing Threshold Amplitude: 0.5 V
Lead Channel Pacing Threshold Amplitude: 0.5 V
Lead Channel Pacing Threshold Amplitude: 1 V
Lead Channel Pacing Threshold Pulse Width: 0.4 ms
Lead Channel Pacing Threshold Pulse Width: 0.4 ms
Lead Channel Pacing Threshold Pulse Width: 0.4 ms
Lead Channel Pacing Threshold Pulse Width: 0.5 ms
Lead Channel Sensing Intrinsic Amplitude: 12 mV
Lead Channel Sensing Intrinsic Amplitude: 2.2 mV
Lead Channel Setting Pacing Amplitude: 2.5 V
Lead Channel Setting Pacing Pulse Width: 0.4 ms
Lead Channel Setting Pacing Pulse Width: 0.5 ms
Lead Channel Setting Sensing Sensitivity: 5 mV
MDC IDC LEAD IMPLANT DT: 20141024
MDC IDC LEAD IMPLANT DT: 20141024
MDC IDC LEAD LOCATION: 753860
MDC IDC MSMT LEADCHNL LV PACING THRESHOLD AMPLITUDE: 1.25 V
MDC IDC MSMT LEADCHNL LV PACING THRESHOLD AMPLITUDE: 1.25 V
MDC IDC MSMT LEADCHNL LV PACING THRESHOLD PULSEWIDTH: 0.5 ms
MDC IDC MSMT LEADCHNL RA IMPEDANCE VALUE: 375 Ohm
MDC IDC MSMT LEADCHNL RV PACING THRESHOLD AMPLITUDE: 1 V
MDC IDC MSMT LEADCHNL RV PACING THRESHOLD PULSEWIDTH: 0.4 ms
MDC IDC PG IMPLANT DT: 20141024
MDC IDC SET LEADCHNL LV PACING AMPLITUDE: 2 V
MDC IDC SET LEADCHNL RA PACING AMPLITUDE: 2 V
MDC IDC STAT BRADY RA PERCENT PACED: 11 %
MDC IDC STAT BRADY RV PERCENT PACED: 99 %
Pulse Gen Serial Number: 7516165

## 2017-12-20 NOTE — Progress Notes (Signed)
Patient Care Team: Marin Olp, MD as PCP - General (Family Medicine)   HPI  [name] [name] is a 84 y.o. female Seen in followup for syncope which was in part orthostatic and partially related to intermittent high-grade heart block. She underwent CRT P.. She has evolved device dependence  She has ischemic heart disease with prior MI and bypass surgery; prior PCI to the LAD and previously normal left ventricular function. However, on evaluation 4/14 echocardiogram demonstrated EF 25-30%. LHC 4/14 Proximal LAD stent patent, LIMA-LAD atretic, D1 occluded, proximal circumflex 30%, mid RCA occluded, SVG-D1 normal, SVG-OM2 normal, SVG-distal RCA normal, EF 40% with diffuse HK   Echocardiogram 2/15 demonstrated ejection fraction of 30-35%  DATE TEST    2/15 Echo    EF 20-25 %   12/17 Echo    EF 30-35 %   1/19 Echo  EF 45-50% Mild AR MR      She has a history of atrial fibrillation. She is on apixoban. Thromboembolic risk profile is notable for age-86 coronary artery disease-1 congestive heart failure-1 gender-1 stroke?-2 for a CHADS-VASc were greater than or equal to 5       She underwent cryoablation for a kidney lesion in December.  This was complicated by significant bleeding requiring rehospitalization as well as significant peripheral edema which is gradually improved  She has then developed complications of Mestinon therapy including insomnia.  This is improved with down titration.  She comes in with her daughter with 2 new complaints.  Wound is worsening fatigue which may be concurrent with the resumption of myrbetriq   the other is ongoing issues with orthostatic intolerance.  It seems worse in the mornings.  Her lisinopril is being taken at night.  It occurs after breakfast but her breakfast basically consists of tea and toast.  She is in the past been intolerant of an abdominal binder.  Date cr K Hgb  9/18 0.86 4.1 12.5  1/19 0.68 3.9 9.9  7/19 1.00 4.2 13.2             Past Medical History:  Diagnosis Date  . Atrial fibrillation ([place]) 10/06/2007   post op  . COPD (chronic obstructive pulmonary disease) ([place])    Emphysema on 02/03/13 CXR  . CORONARY ARTERY DISEASE 10/06/2007   a. s/p PCI to LAD; b. s/p CABG; c. LHC 07/13/12: Proximal LAD stent patent, LIMA-LAD atretic, D1 occluded, proximal circumflex 30%, mid RCA occluded, SVG-D1 normal, SVG-OM2 normal, SVG-distal RCA normal, EF 40% with diffuse HK  . Dizziness   . History of colonic polyps 10/30/2009   No polyps in 2011. No repeat.    Marland Kitchen HYPERLIPIDEMIA 10/06/2007  . HYPERTENSION 10/06/2007  . LBBB 09/06/2008  . Left renal mass   . MELANOMA 09/06/2008   MOES PROCEDURE RIGHT  . MYOCARDIAL INFARCTION, HX OF 10/06/2007  . NICM (nonischemic cardiomyopathy) ([place])    Echocardiogram 07/12/12: EF 25-30%, diffuse HK, mild AI, mild MR, mild LAE  . PERSONAL HX COLONIC POLYPS 10/30/2009  . Presence of permanent cardiac pacemaker   . Syncope   . UTERINE CANCER, HX OF 09/06/2008  . VARICOSE VEIN 09/29/2009    Past Surgical History:  Procedure Laterality Date  . ABDOMINAL HYSTERECTOMY  1988  . BI-VENTRICULAR PACEMAKER INSERTION (CRT-P)  02/02/2013   St. Jude, serial no. #[id number]   . CARDIAC CATHETERIZATION  08/30/2008  . CORONARY ANGIOPLASTY WITH STENT PLACEMENT  2009  . CORONARY ARTERY BYPASS GRAFT  2004  . IR RADIOLOGIST  EVAL & MGMT  02/08/2017  . IR RADIOLOGIST EVAL & MGMT  05/26/2017  . IR RADIOLOGIST EVAL & MGMT  07/19/2017  . LEFT HEART CATHETERIZATION WITH CORONARY ANGIOGRAM Bilateral 07/13/2012   Procedure: LEFT HEART CATHETERIZATION WITH CORONARY ANGIOGRAM;  Surgeon: Peter M Martinique, MD;  Location: [facility] CATH LAB;  Service: Cardiovascular;  Laterality: Bilateral;  . OOPHORECTOMY    . PERMANENT PACEMAKER INSERTION N/A 02/02/2013   Procedure: PERMANENT PACEMAKER INSERTION;  Surgeon: Evans Lance, MD;  Location: [facility] CATH LAB;  Service: Cardiovascular;  Laterality: N/A;  . RADIOLOGY WITH ANESTHESIA Left  03/28/2017   Procedure: RENAL CRYO ABLATION;  Surgeon: Aletta Edouard, MD;  Location: [facility] ORS;  Service: Radiology;  Laterality: Left;  . right distal pretibeal     melanoma    Current Outpatient Medications  Medication Sig Dispense Refill  . busPIRone (BUSPAR) 5 MG tablet Take 1 tablet (5 mg total) by mouth 2 (two) times daily as needed (agitation). 60 tablet 2  . ELIQUIS 5 MG TABS tablet TAKE 1 TABLET(5 MG) BY MOUTH TWICE DAILY 60 tablet 5  . escitalopram (LEXAPRO) 20 MG tablet TAKE 1 TABLET(20 MG) BY MOUTH DAILY 90 tablet 1  . fexofenadine (ALLEGRA) 180 MG tablet Take 180 mg by mouth daily.    Marland Kitchen lisinopril (PRINIVIL,ZESTRIL) 2.5 MG tablet TAKE 1 TABLET(2.5 MG) BY MOUTH DAILY 90 tablet 2  . mirabegron ER (MYRBETRIQ) 25 MG TB24 tablet Take 25 mg by mouth daily.    Marland Kitchen pyridostigmine (MESTINON) 60 MG tablet Take 60 mg by mouth daily with breakfast. .     No current facility-administered medications for this visit.     Allergies  Allergen Reactions  . Aricept [Donepezil] Shortness Of Breath and Other (See Comments)    Hallucinations and loss of sensation in legs  . Nitroglycerin Other (See Comments)    Oral spray form causes rapid drop in blood pressure.      Review of Systems negative except from HPI and PMH  Physical Exam BP 126/68   Pulse 60   Ht 5\' 9"  (1.753 m)   Wt 146 lb 9.6 oz (66.5 kg)   SpO2 96%   BMI 21.65 kg/m  Well developed and nourished in no acute distress HENT normal Neck supple with JVP-flat Clear Regular rate and rhythm, no murmurs or gallops Abd-soft with active BS No Clubbing cyanosis edema Skin-warm and dry A & Oriented  Grossly normal sensory and motor function   ECG demonstrates  P-synchronous/ AV  pacing   Assessment and  Plan  Hypertension  Complete heart block  Orthostatic intolerance  CHF chronic systolic  Anemia  Atrial fibrillation paroysmal   Pacemaker-St. Jude-CRT The patient's device was interrogated.  The information was  reviewed. No changes were made in the programming.     Ischemic cardiomyopathy with prior bypass     Without symptoms of ischemia  Euvolemic continue current meds  The big issue remains orthostatic intolerance in the context of systolic hypertension.  We will continue her p.m. lisinopril.  We have discussed nonpharmacological therapies including raising the head of the bed and compressive wear.  She did not tolerate an abdominal binder; we will try her on thigh sleeves.  In the event that this does not sufficiently remediate, will stop her Mestinon and try her on ProAmatine.  We spent more than 50% of our >25 min visit in face to face counseling regarding the above

## 2017-12-20 NOTE — Patient Instructions (Addendum)
Medication Instructions:  Your physician recommends that you continue on your current medications as directed. Please refer to the Current Medication list given to you today.  Labwork: None ordered.  Testing/Procedures: None ordered.  Follow-Up: Your physician recommends that you schedule a follow-up appointment in:   6 months with Dr Caryl Comes  Remote monitoring is used to monitor your Pacemaker from home. This monitoring reduces the number of office visits required to check your device to one time per year. It allows [place] to keep an eye on the functioning of your device to ensure it is working properly. You are scheduled for a device check from home on 03/21/2018. You may send your transmission at any time that day. If you have a wireless device, the transmission will be sent automatically. After your physician reviews your transmission, you will receive a postcard with your next transmission date.    Any Other Special Instructions Will Be Listed Below (If Applicable).  Dr Caryl Comes would like you start wearing compression sleeves on your thighs.    If you need a refill on your cardiac medications before your next appointment, please call your pharmacy.

## 2018-01-17 ENCOUNTER — Ambulatory Visit (INDEPENDENT_AMBULATORY_CARE_PROVIDER_SITE_OTHER): Payer: Medicare Other | Admitting: Podiatry

## 2018-01-17 ENCOUNTER — Encounter: Payer: Self-pay | Admitting: Podiatry

## 2018-01-17 DIAGNOSIS — Q828 Other specified congenital malformations of skin: Secondary | ICD-10-CM | POA: Diagnosis not present

## 2018-01-17 DIAGNOSIS — D689 Coagulation defect, unspecified: Secondary | ICD-10-CM

## 2018-01-17 DIAGNOSIS — B351 Tinea unguium: Secondary | ICD-10-CM | POA: Diagnosis not present

## 2018-01-17 DIAGNOSIS — M79676 Pain in unspecified toe(s): Secondary | ICD-10-CM

## 2018-01-17 NOTE — Progress Notes (Signed)
Patient ID: [name] [name], female   DOB: [birth year], 84 y.o.   MRN: [id number] Complaint:  Visit Type: Patient returns to my office for continued preventative foot care services. Complaint: Patient states" my nails have grown long and thick and become painful to walk and wear shoes" . The patient presents for preventative foot care services. No changes to ROS.  Patient says the callus on her right forefoot is extremely painful when walking.  Podiatric Exam: Vascular: dorsalis pedis and posterior tibial pulses are palpable bilateral. Capillary return is immediate. Temperature gradient is WNL. Skin turgor WNL  Sensorium: Normal Semmes Weinstein monofilament test. Normal tactile sensation bilaterally. Nail Exam: Pt has thick disfigured discolored nails with subungual debris noted bilateral entire nail hallux through fifth toenails Ulcer Exam: There is no evidence of ulcer or pre-ulcerative changes or infection. Orthopedic Exam: Muscle tone and strength are WNL. No limitations in general ROM. No crepitus or effusions noted. Foot type and digits show no abnormalities. Plantarflexed third metatarsal right foot. Skin:  Porokeratosis sub 3 right. No infection or ulcers  Diagnosis:  Onychomycosis, , Pain in right toe, pain in left toes  Debride porokeratosis  Treatment & Plan Procedures and Treatment: Consent by patient was obtained for treatment procedures. The patient understood the discussion of treatment and procedures well. All questions were answered thoroughly reviewed. Debridement of mycotic and hypertrophic toenails, 1 through 5 bilateral and clearing of subungual debris. No ulceration, no infection noted.  Padding dispensed. Return Visit-Office Procedure: Patient instructed to return to the office for a follow up visit 3 months for continued evaluation and treatment.    Gardiner Barefoot DPM

## 2018-02-05 ENCOUNTER — Other Ambulatory Visit: Payer: Self-pay | Admitting: Family Medicine

## 2018-02-05 ENCOUNTER — Other Ambulatory Visit: Payer: Self-pay | Admitting: Internal Medicine

## 2018-02-06 NOTE — Telephone Encounter (Signed)
Eliquis 5mg  refill request received; pt is 84 yrs old, wt-66.5kg, Crea-1.00 on 10/27/17, last seen by Dr. Caryl Comes on 12/20/17; will send in refill to requested pharmacy.

## 2018-02-15 ENCOUNTER — Ambulatory Visit (INDEPENDENT_AMBULATORY_CARE_PROVIDER_SITE_OTHER): Payer: Medicare Other | Admitting: Podiatry

## 2018-02-15 ENCOUNTER — Encounter: Payer: Self-pay | Admitting: Podiatry

## 2018-02-15 DIAGNOSIS — D689 Coagulation defect, unspecified: Secondary | ICD-10-CM | POA: Diagnosis not present

## 2018-02-15 DIAGNOSIS — B351 Tinea unguium: Secondary | ICD-10-CM

## 2018-02-15 DIAGNOSIS — M79676 Pain in unspecified toe(s): Secondary | ICD-10-CM | POA: Diagnosis not present

## 2018-02-15 NOTE — Progress Notes (Signed)
Patient ID: [name] [name], female   DOB: 10/27/1931, 84 y.o.   MRN: [id number] Complaint:  Visit Type: Patient returns to my office for continued preventative foot care services. Complaint: Patient states" my nails have grown long and thick and become painful to walk and wear shoes" . The patient presents for preventative foot care services. No changes to ROS.  Patient says the callus on her right forefoot has become less painful and she likes the pads.  Podiatric Exam: Vascular: dorsalis pedis and posterior tibial pulses are palpable bilateral. Capillary return is immediate. Temperature gradient is WNL. Skin turgor WNL  Sensorium: Normal Semmes Weinstein monofilament test. Normal tactile sensation bilaterally. Nail Exam: Pt has thick disfigured discolored nails with subungual debris noted bilateral entire nail hallux through fifth toenails Ulcer Exam: There is no evidence of ulcer or pre-ulcerative changes or infection. Orthopedic Exam: Muscle tone and strength are WNL. No limitations in general ROM. No crepitus or effusions noted. Foot type and digits show no abnormalities. Plantarflexed third metatarsal right foot. Skin:  Porokeratosis sub 3 right. No infection or ulcers  Diagnosis:  Onychomycosis, , Pain in right toe, pain in left toes  Debride porokeratosis  Treatment & Plan Procedures and Treatment: Consent by patient was obtained for treatment procedures. The patient understood the discussion of treatment and procedures well. All questions were answered thoroughly reviewed. Debridement of mycotic and hypertrophic toenails, 1 through 5 bilateral and clearing of subungual debris. No ulceration, no infection noted.  Padding dispensed. Return Visit-Office Procedure: Patient instructed to return to the office for a follow up visit 10 weeks  for continued evaluation and treatment.    Que Meneely DPM 

## 2018-02-16 ENCOUNTER — Other Ambulatory Visit: Payer: Self-pay | Admitting: Internal Medicine

## 2018-02-28 ENCOUNTER — Encounter

## 2018-02-28 ENCOUNTER — Encounter: Payer: Self-pay | Admitting: Family Medicine

## 2018-02-28 ENCOUNTER — Ambulatory Visit (INDEPENDENT_AMBULATORY_CARE_PROVIDER_SITE_OTHER): Payer: Medicare Other | Admitting: Family Medicine

## 2018-02-28 VITALS — BP 120/72 | HR 71 | Temp 97.8°F | Ht 69.0 in | Wt 149.6 lb

## 2018-02-28 DIAGNOSIS — F324 Major depressive disorder, single episode, in partial remission: Secondary | ICD-10-CM | POA: Diagnosis not present

## 2018-02-28 DIAGNOSIS — R61 Generalized hyperhidrosis: Secondary | ICD-10-CM | POA: Diagnosis not present

## 2018-02-28 LAB — CBC WITH DIFFERENTIAL/PLATELET
BASOS ABS: 0 10*3/uL (ref 0.0–0.1)
BASOS PCT: 0.6 % (ref 0.0–3.0)
EOS ABS: 0.1 10*3/uL (ref 0.0–0.7)
Eosinophils Relative: 1.9 % (ref 0.0–5.0)
HEMATOCRIT: 40.7 % (ref 36.0–46.0)
Hemoglobin: 13.5 g/dL (ref 12.0–15.0)
LYMPHS PCT: 22.5 % (ref 12.0–46.0)
Lymphs Abs: 1.3 10*3/uL (ref 0.7–4.0)
MCHC: 33.2 g/dL (ref 30.0–36.0)
MCV: 90.4 fl (ref 78.0–100.0)
MONO ABS: 0.4 10*3/uL (ref 0.1–1.0)
Monocytes Relative: 6.4 % (ref 3.0–12.0)
NEUTROS ABS: 3.9 10*3/uL (ref 1.4–7.7)
NEUTROS PCT: 68.6 % (ref 43.0–77.0)
PLATELETS: 125 10*3/uL — AB (ref 150.0–400.0)
RBC: 4.5 Mil/uL (ref 3.87–5.11)
RDW: 13.8 % (ref 11.5–15.5)
WBC: 5.6 10*3/uL (ref 4.0–10.5)

## 2018-02-28 LAB — POC URINALSYSI DIPSTICK (AUTOMATED)
BILIRUBIN UA: NEGATIVE
GLUCOSE UA: NEGATIVE
Ketones, UA: NEGATIVE
Leukocytes, UA: NEGATIVE
Nitrite, UA: NEGATIVE
Protein, UA: NEGATIVE
UROBILINOGEN UA: 0.2 U/dL
pH, UA: 6 (ref 5.0–8.0)

## 2018-02-28 LAB — COMPREHENSIVE METABOLIC PANEL
ALT: 9 U/L (ref 0–35)
AST: 15 U/L (ref 0–37)
Albumin: 3.9 g/dL (ref 3.5–5.2)
Alkaline Phosphatase: 60 U/L (ref 39–117)
BILIRUBIN TOTAL: 0.5 mg/dL (ref 0.2–1.2)
BUN: 16 mg/dL (ref 6–23)
CALCIUM: 9 mg/dL (ref 8.4–10.5)
CHLORIDE: 107 meq/L (ref 96–112)
CO2: 28 meq/L (ref 19–32)
CREATININE: 0.9 mg/dL (ref 0.40–1.20)
GFR: 63.35 mL/min (ref >60.00)
GLUCOSE: 133 mg/dL — AB (ref 70–99)
Potassium: 4 mEq/L (ref 3.5–5.1)
SODIUM: 142 meq/L (ref 135–145)
Total Protein: 6.3 g/dL (ref 6.0–8.3)

## 2018-02-28 LAB — TSH: TSH: 1.8 u[IU]/mL (ref 0.35–4.50)

## 2018-02-28 NOTE — Progress Notes (Signed)
Subjective:  [name] [name] is a 84 y.o. year old very pleasant female patient who presents for/with See problem oriented charting ROS-has had some periods of sweating-no chest pain or shortness of breath with these episodes.  No nausea or vomiting.  Past Medical History-  Patient Active Problem List   Diagnosis Date Noted  . Left renal mass 01/27/2017    Priority: High  . CAD (coronary artery disease) 04/17/2014    Priority: High  . Mild cognitive impairment 04/17/2014    Priority: High  . Pacemaker -CRT-St. Jude 05/21/2013    Priority: High  . Atrioventricular block, complete ([place]) 03/08/2013    Priority: High  . Paroxysmal atrial fibrillation ([facility]) 02/03/2013    Priority: High  . Chronic combined systolic and diastolic congestive heart failure ([place]) 10/10/2012    Priority: High  . Ischemic cardiomyopathy  EF 30% cath 4/14 with stent 10/06/2007    Priority: High  . Overactive bladder 07/09/2016    Priority: Medium  . Dizziness 05/07/2016    Priority: Medium  . Microscopic hematuria 09/02/2015    Priority: Medium  . Asthma, intrinsic 02/03/2013    Priority: Medium  . Depression 09/19/2012    Priority: Medium  . History of melanoma 09/06/2008    Priority: Medium  . History of uterine cancer 09/06/2008    Priority: Medium  . Hyperlipidemia 10/06/2007    Priority: Medium  . Essential hypertension 10/06/2007    Priority: Medium  . Allergic rhinitis 04/17/2014    Priority: Low  . Hearing loss 04/17/2014    Priority: Low  . Varicose veins 09/29/2009    Priority: Low  . LBBB (left bundle branch block) 09/06/2008    Priority: Low  . Arthritis of carpometacarpal [facility]) joint of left thumb 06/02/2017  . Current use of long term anticoagulation 06/02/2017  . HCAP (healthcare-associated pneumonia) 04/04/2017  . Traumatic perinephric hematoma of left kidney 03/29/2017  . Acute blood loss as cause of postoperative anemia 03/29/2017    Medications- reviewed and  updated Current Outpatient Medications  Medication Sig Dispense Refill  . busPIRone (BUSPAR) 5 MG tablet Take 1 tablet (5 mg total) by mouth 2 (two) times daily as needed (agitation). 60 tablet 2  . ELIQUIS 5 MG TABS tablet TAKE 1 TABLET(5 MG) BY MOUTH TWICE DAILY 60 tablet 5  . escitalopram (LEXAPRO) 20 MG tablet TAKE 1 TABLET(20 MG) BY MOUTH DAILY 90 tablet 1  . fexofenadine (ALLEGRA) 180 MG tablet Take 180 mg by mouth daily.    Marland Kitchen lisinopril (PRINIVIL,ZESTRIL) 2.5 MG tablet TAKE 1 TABLET(2.5 MG) BY MOUTH DAILY 90 tablet 2  . mirabegron ER (MYRBETRIQ) 25 MG TB24 tablet Take 25 mg by mouth daily.    Marland Kitchen pyridostigmine (MESTINON) 60 MG tablet Take 60 mg by mouth daily with breakfast. .    . pyridostigmine (MESTINON) 60 MG tablet Take 1 tablet (60 mg total) by mouth daily. 90 tablet 2   No current facility-administered medications for this visit.     Objective: BP 120/72 (BP Location: Left Arm, Patient Position: Sitting, Cuff Size: Normal)   Pulse 71   Temp 97.8 F (36.6 C) (Oral)   Ht 5\' 9"  (1.753 m)   Wt 149 lb 9.6 oz (67.9 kg)   LMP  (LMP Unknown)   SpO2 96%   BMI 22.09 kg/m  Gen: NAD, resting comfortably No thyromegaly CV: RRR no murmurs rubs or gallops Lungs: CTAB no crackles, wheeze, rhonchi Abdomen: soft/nontender/nondistended/normal bowel sounds.  Ext: no edema Skin: warm, dry Neuro:  Speech normal, moves all extremities- easily agitated  Assessment/Plan:  Excess sweating S: Breaking out into sweats- all of her clothes. Having to change clothes due to amount of sweat. Happens about twice a day. No unintentional weight loss- actually has gained weight. Mainly in daytime- no night sweats. Daughter cut down on escitalopram to 10mg  and seems to have helped. Baseline level of fatigue  She notes increased thirst compared to baseline. Not peeing more frequently. Finds herself worrying more often.  A/P: Excess sweating may have been due to escitalopram increase- thankful daughter  who is a Designer, jewellery had the idea to reduce this dosage and that seems to have improved her issues.  We will get basic labs including CBC, CMP, urine, TSH to look for any other obvious cause.  No night sweats and no unintentional weight loss to suggest malignancy.  Doubt something like pheochromocytoma as patient does not really have other symptoms during these episodes.  Depression S:lives alone- husband died 3 years ago per her- but has been longer- issues with loneliness.  She admits depression has been a problem for her.  PHQ 9 slightly elevated today at 6.  Per caregiver-daughter [name] has recently been cutting her escitalopram in half to 10 mg as this has helped with her sweating issues A/P: Mild poor control of depression- continue lower escitalopram dose.  Encouraged her to add counseling-handout given  I am reaching out to the patient's daughter to see if she would like [place] to send in 10 mg dosage of escitalopram again.  Future Appointments  Date Time Provider [place]  03/21/2018 10:35 AM [facility] DEVICE REMOTES [facility]  04/03/2018 11:30 AM Narda Amber K, DO [facility] None  04/26/2018  4:15 PM Gardiner Barefoot, DPM [facility]  06/20/2018  3:00 PM Deboraha Sprang, MD [facility]   54-month follow-up or sooner if she needs [place]  Lab/Order associations: Excessive sweating - Plan: TSH, CBC with Differential/Platelet, Comprehensive metabolic panel, POCT Urinalysis Dipstick (Automated)  Major depressive disorder with single episode, in partial remission ([place])  Return precautions advised.  Garret Reddish, MD

## 2018-02-28 NOTE — Patient Instructions (Addendum)
Team please help her fill out a PHQ9  Please stop by lab before you go  Escitalopram can cause sweating- if that's the medicine that was reduced to half pill I can understand that. Lets try to add counseling to give more benefit while trying to keep medication burden lower  Depression screen [facility] 2/9 02/28/2018 10/27/2017 11/04/2016 09/26/2015 09/02/2015  Decreased Interest 1 0 1 1 0  Down, Depressed, Hopeless 1 1 0 0 0  PHQ - 2 Score 2 1 1 1  0  Altered sleeping 1 0 - - -  Tired, decreased energy 1 1 - - -  Change in appetite 1 0 - - -  Feeling bad or failure about yourself  0 2 - - -  Trouble concentrating 1 0 - - -  Moving slowly or fidgety/restless 0 0 - - -  Suicidal thoughts 0 0 - - -  PHQ-9 Score 6 4 - - -  Difficult doing work/chores Not difficult at all Not difficult at all - - -  Some recent data might be hidden

## 2018-02-28 NOTE — Assessment & Plan Note (Signed)
S:lives alone- husband died 3 years ago per her- but has been longer- issues with loneliness.  She admits depression has been a problem for her.  PHQ 9 slightly elevated today at 6.  Per caregiver-daughter [name] has recently been cutting her escitalopram in half to 10 mg as this has helped with her sweating issues A/P: Mild poor control of depression- continue lower escitalopram dose.  Encouraged her to add counseling-handout given

## 2018-03-02 ENCOUNTER — Other Ambulatory Visit: Payer: Self-pay | Admitting: Radiology

## 2018-03-02 ENCOUNTER — Other Ambulatory Visit (HOSPITAL_COMMUNITY): Payer: Self-pay | Admitting: Interventional Radiology

## 2018-03-02 DIAGNOSIS — D3002 Benign neoplasm of left kidney: Secondary | ICD-10-CM

## 2018-03-03 ENCOUNTER — Other Ambulatory Visit: Payer: Self-pay

## 2018-03-03 DIAGNOSIS — R319 Hematuria, unspecified: Secondary | ICD-10-CM

## 2018-03-06 ENCOUNTER — Telehealth: Payer: Self-pay | Admitting: Family Medicine

## 2018-03-06 ENCOUNTER — Other Ambulatory Visit (INDEPENDENT_AMBULATORY_CARE_PROVIDER_SITE_OTHER): Payer: Medicare Other

## 2018-03-06 DIAGNOSIS — R319 Hematuria, unspecified: Secondary | ICD-10-CM | POA: Diagnosis not present

## 2018-03-06 NOTE — Telephone Encounter (Signed)
Copied from [place] [id number]. Topic: Quick Communication - See Telephone Encounter >> Mar 06, 2018 10:38 AM Blase Mess A wrote: CRM for notification. See Telephone encounter for: 03/06/18. Patient called to confirm that order has been placed for 2nd urine labs. Patient will come in today.

## 2018-03-07 LAB — URINALYSIS, MICROSCOPIC ONLY: RBC / HPF: NONE SEEN (ref 0–?)

## 2018-03-07 NOTE — Telephone Encounter (Signed)
Orders had been placed. Patient was in yesterday

## 2018-03-08 LAB — URINE CULTURE
MICRO NUMBER: 91418529
SPECIMEN QUALITY:: ADEQUATE

## 2018-03-21 ENCOUNTER — Telehealth: Payer: Self-pay | Admitting: Cardiology

## 2018-03-21 NOTE — Telephone Encounter (Signed)
Spoke with pt and reminded pt of remote transmission that is due today. Pt verbalized understanding.   

## 2018-03-24 ENCOUNTER — Encounter: Payer: Self-pay | Admitting: Cardiology

## 2018-04-03 ENCOUNTER — Ambulatory Visit: Payer: Medicare Other | Admitting: Neurology

## 2018-04-18 ENCOUNTER — Other Ambulatory Visit: Payer: Self-pay | Admitting: Radiology

## 2018-04-24 ENCOUNTER — Other Ambulatory Visit: Payer: Self-pay | Admitting: *Deleted

## 2018-04-24 DIAGNOSIS — N289 Disorder of kidney and ureter, unspecified: Secondary | ICD-10-CM

## 2018-04-26 ENCOUNTER — Ambulatory Visit (INDEPENDENT_AMBULATORY_CARE_PROVIDER_SITE_OTHER): Payer: Medicare Other | Admitting: Podiatry

## 2018-04-26 ENCOUNTER — Encounter: Payer: Self-pay | Admitting: Podiatry

## 2018-04-26 DIAGNOSIS — B351 Tinea unguium: Secondary | ICD-10-CM | POA: Diagnosis not present

## 2018-04-26 DIAGNOSIS — D689 Coagulation defect, unspecified: Secondary | ICD-10-CM

## 2018-04-26 DIAGNOSIS — M79676 Pain in unspecified toe(s): Secondary | ICD-10-CM | POA: Diagnosis not present

## 2018-04-26 NOTE — Progress Notes (Signed)
Patient ID: [name] [name], female   DOB: October 14, 1931, 85 y.o.   MRN: [id number] Complaint:  Visit Type: Patient returns to my office for continued preventative foot care services. Complaint: Patient states" my nails have grown long and thick and become painful to walk and wear shoes" . The patient presents for preventative foot care services. No changes to ROS.  Patient says the callus on her right forefoot has become less painful and she likes the pads.  Podiatric Exam: Vascular: dorsalis pedis and posterior tibial pulses are palpable bilateral. Capillary return is immediate. Temperature gradient is WNL. Skin turgor WNL  Sensorium: Normal Semmes Weinstein monofilament test. Normal tactile sensation bilaterally. Nail Exam: Pt has thick disfigured discolored nails with subungual debris noted bilateral entire nail hallux through fifth toenails Ulcer Exam: There is no evidence of ulcer or pre-ulcerative changes or infection. Orthopedic Exam: Muscle tone and strength are WNL. No limitations in general ROM. No crepitus or effusions noted. Foot type and digits show no abnormalities. Plantarflexed third metatarsal right foot. Skin:  Porokeratosis sub 3 right. No infection or ulcers  Diagnosis:  Onychomycosis, , Pain in right toe, pain in left toes  Debride porokeratosis  Treatment & Plan Procedures and Treatment: Consent by patient was obtained for treatment procedures. The patient understood the discussion of treatment and procedures well. All questions were answered thoroughly reviewed. Debridement of mycotic and hypertrophic toenails, 1 through 5 bilateral and clearing of subungual debris. No ulceration, no infection noted.  Padding dispensed. Return Visit-Office Procedure: Patient instructed to return to the office for a follow up visit 10 weeks  for continued evaluation and treatment.    Gardiner Barefoot DPM

## 2018-05-01 ENCOUNTER — Ambulatory Visit (HOSPITAL_COMMUNITY)
Admission: RE | Admit: 2018-05-01 | Discharge: 2018-05-01 | Disposition: A | Discharge status: Home / Self Care | Payer: Medicare Other | Source: Ambulatory Visit | Attending: Interventional Radiology | Admitting: Interventional Radiology

## 2018-05-01 ENCOUNTER — Encounter (HOSPITAL_COMMUNITY): Payer: Self-pay

## 2018-05-01 DIAGNOSIS — D3002 Benign neoplasm of left kidney: Secondary | ICD-10-CM | POA: Diagnosis not present

## 2018-05-01 LAB — POCT I-STAT CREATININE: Creatinine, Ser: 1.2 mg/dL — ABNORMAL HIGH (ref 0.44–1.00)

## 2018-05-01 MED ORDER — SODIUM CHLORIDE (PF) 0.9 % IJ SOLN
INTRAMUSCULAR | Status: AC
  Filled 2018-05-01: qty 50

## 2018-05-01 MED ORDER — IOHEXOL 300 MG/ML  SOLN
100.0000 mL | Freq: Once | INTRAMUSCULAR | Status: AC | PRN
  Administered 2018-05-01: 100 mL via INTRAVENOUS

## 2018-05-04 ENCOUNTER — Ambulatory Visit
Admission: RE | Admit: 2018-05-04 | Discharge: 2018-05-04 | Disposition: A | Discharge status: Home / Self Care | Payer: Medicare Other | Source: Ambulatory Visit | Attending: Interventional Radiology | Admitting: Interventional Radiology

## 2018-05-04 ENCOUNTER — Encounter: Payer: Self-pay | Admitting: Radiology

## 2018-05-04 DIAGNOSIS — D3002 Benign neoplasm of left kidney: Secondary | ICD-10-CM

## 2018-05-04 HISTORY — PX: IR RADIOLOGIST EVAL & MGMT: IMG5224

## 2018-05-04 NOTE — Progress Notes (Signed)
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  History of Present Illness: [name] [name] is a 85 y.o. female now just over 1 year post cryoablation of a left renal oncocytoma.  She has been doing well and is without any urinary complaints or abdominal pain.  Past Medical History:  Diagnosis Date  . Atrial fibrillation ([place]) 10/06/2007   post op  . COPD (chronic obstructive pulmonary disease) ([place])    Emphysema on 02/03/13 CXR  . CORONARY ARTERY DISEASE 10/06/2007   a. s/p PCI to LAD; b. s/p CABG; c. LHC 07/13/12: Proximal LAD stent patent, LIMA-LAD atretic, D1 occluded, proximal circumflex 30%, mid RCA occluded, SVG-D1 normal, SVG-OM2 normal, SVG-distal RCA normal, EF 40% with diffuse HK  . Dizziness   . History of colonic polyps 10/30/2009   No polyps in 2011. No repeat.    Marland Kitchen HYPERLIPIDEMIA 10/06/2007  . HYPERTENSION 10/06/2007  . LBBB 09/06/2008  . Left renal mass   . MELANOMA 09/06/2008   MOES PROCEDURE RIGHT  . MYOCARDIAL INFARCTION, HX OF 10/06/2007  . NICM (nonischemic cardiomyopathy) ([place])    Echocardiogram 07/12/12: EF 25-30%, diffuse HK, mild AI, mild MR, mild LAE  . PERSONAL HX COLONIC POLYPS 10/30/2009  . Presence of permanent cardiac pacemaker   . Syncope   . UTERINE CANCER, HX OF 09/06/2008  . VARICOSE VEIN 09/29/2009    Past Surgical History:  Procedure Laterality Date  . ABDOMINAL HYSTERECTOMY  1988  . BI-VENTRICULAR PACEMAKER INSERTION (CRT-P)  02/02/2013   St. Jude, serial no. #[id number]   . CARDIAC CATHETERIZATION  08/30/2008  . CORONARY ANGIOPLASTY WITH STENT PLACEMENT  2009  . CORONARY ARTERY BYPASS GRAFT  2004  . IR RADIOLOGIST EVAL & MGMT  02/08/2017  . IR RADIOLOGIST EVAL & MGMT  05/26/2017  . IR RADIOLOGIST EVAL & MGMT  07/19/2017  . IR RADIOLOGIST EVAL & MGMT  05/04/2018  . LEFT HEART CATHETERIZATION WITH CORONARY ANGIOGRAM Bilateral 07/13/2012   Procedure: LEFT HEART CATHETERIZATION WITH CORONARY ANGIOGRAM;   Surgeon: Peter M Martinique, MD;  Location: [facility] CATH LAB;  Service: Cardiovascular;  Laterality: Bilateral;  . OOPHORECTOMY    . PERMANENT PACEMAKER INSERTION N/A 02/02/2013   Procedure: PERMANENT PACEMAKER INSERTION;  Surgeon: Evans Lance, MD;  Location: [facility] CATH LAB;  Service: Cardiovascular;  Laterality: N/A;  . RADIOLOGY WITH ANESTHESIA Left 03/28/2017   Procedure: RENAL CRYO ABLATION;  Surgeon: Aletta Edouard, MD;  Location: [facility] ORS;  Service: Radiology;  Laterality: Left;  . right distal pretibeal     melanoma    Allergies: Aricept [donepezil] and Nitroglycerin  Medications: Prior to Admission medications   Medication Sig Start Date End Date Taking? Authorizing Provider  busPIRone (BUSPAR) 5 MG tablet Take 1 tablet (5 mg total) by mouth 2 (two) times daily as needed (agitation). 07/02/17   Marin Olp, MD  ELIQUIS 5 MG TABS tablet TAKE 1 TABLET(5 MG) BY MOUTH TWICE DAILY 02/06/18   Deboraha Sprang, MD  escitalopram (LEXAPRO) 20 MG tablet TAKE 1 TABLET(20 MG) BY MOUTH DAILY 12/19/17   Marin Olp, MD  fexofenadine (ALLEGRA) 180 MG tablet Take 180 mg by mouth daily.    [provider]  lisinopril (PRINIVIL,ZESTRIL) 2.5 MG tablet TAKE 1 TABLET(2.5 MG) BY MOUTH DAILY 02/16/18   Deboraha Sprang, MD  mirabegron ER (MYRBETRIQ) 25 MG TB24 tablet Take 25 mg by mouth daily.    [provider]  pyridostigmine (MESTINON) 60 MG tablet Take 60  mg by mouth daily with breakfast. .    [provider]  pyridostigmine (MESTINON) 60 MG tablet Take 1 tablet (60 mg total) by mouth daily. 02/06/18   Marin Olp, MD     Family History  Problem Relation Age of Onset  . Stroke Mother   . Heart attack Father   . Colon cancer Neg Hx     Social History   Socioeconomic History  . Marital status: Widowed    Spouse name: Not on file  . Number of children: Not on file  . Years of education: Not on file  . Highest education level: Not on file  Occupational History    . Not on file  Social Needs  . Financial resource strain: Not on file  . Food insecurity:    Worry: Not on file    Inability: Not on file  . Transportation needs:    Medical: Not on file    Non-medical: Not on file  Tobacco Use  . Smoking status: Never Smoker  . Smokeless tobacco: Never Used  Substance and Sexual Activity  . Alcohol use: Yes    Comment:  OCC gin or wine  . Drug use: No  . Sexual activity: Not on file  Lifestyle  . Physical activity:    Days per week: Not on file    Minutes per session: Not on file  . Stress: Not at all  Relationships  . Social connections:    Talks on phone: Not on file    Gets together: Not on file    Attends religious service: Not on file    Active member of club or organization: Not on file    Attends meetings of clubs or organizations: Not on file    Relationship status: Not on file  Other Topics Concern  . Not on file  Social History Narrative   Widowed. Lives alone with new cat as of 2018. 3 children. 6 grandkids   Daughter  (NP) and son in Sports coach (pharmacist-teaches at [employer]) that live close and help out.       Drives in the daytime, shops for herself, cleaning-has someone over to clean due to the intermittent lightheadedness, bathing      Retired from physical education.         Review of Systems: A 12 point ROS discussed and pertinent positives are indicated in the HPI above.  All other systems are negative.  Review of Systems  Constitutional: Negative.   Respiratory: Negative.   Cardiovascular: Negative.   Gastrointestinal: Negative.   Genitourinary: Negative.   Musculoskeletal: Negative.   Neurological: Negative.     Vital Signs: BP 120/64   Pulse 74   Temp 98.1 F (36.7 C) (Oral)   Resp 15   Ht 5' 5.5" (1.664 m)   LMP  (LMP Unknown)   SpO2 96%   BMI 24.52 kg/m   Physical Exam Vitals signs reviewed.  Constitutional:      General: She is not in acute distress.    Appearance: She is not ill-appearing.   Abdominal:     Palpations: Abdomen is soft.  Neurological:     Mental Status: She is alert.    Imaging: Ct Abdomen W Wo Contrast  Result Date: 05/01/2018 CLINICAL DATA:  One year follow up of solid left renal mass cryoablation EXAM: CT ABDOMEN WITHOUT AND WITH CONTRAST TECHNIQUE: Multidetector CT imaging of the abdomen was performed following the standard protocol before and following the bolus administration of intravenous  contrast. CONTRAST:  159m OMNIPAQUE IOHEXOL 300 MG/ML  SOLN COMPARISON:  Multiple exams, including 07/19/2017 and 01/11/2017 FINDINGS: Lower chest: Pacer leads noted. Minimal scarring laterally in the right lower lobe. Hepatobiliary: Unremarkable Pancreas: Unremarkable Spleen: Unremarkable Adrenals/Urinary Tract: Further evolutionary findings in the cryoablation site, mildly reduced in size and currently measuring 2.9 by 2.3 cm with a somewhat dense and enhancing rim for example as shown on image 55/11, but without enhancing nodularity. This rim surrounds a layer of adipose tissue itself surrounding a small nonenhancing fluid density component. Mild adjacent scarring noted in the perirenal adipose tissues. Several tiny likely cystic lesions are present in the right mid kidney posterior to the ablation site, + most notably a 1.3 by 0.7 cm lesion on image 51/2 which has high precontrast density favoring a complex cyst. No additional significant lesions are identified. Adrenal glands normal. Stomach/Bowel: Periampullary duodenal diverticulum. Vascular/Lymphatic: Aortoiliac atherosclerotic vascular disease. No pathologic adenopathy. No tumor thrombus in the left renal vein. Other: No supplemental non-categorized findings. Musculoskeletal: Old healed bilateral rib fractures. Prior median sternotomy. Stable small sclerotic lesion in the left iliac bone on image 79/2, sharp zone of transition, likely a bone island. Grade 1 retrolisthesis and prominent degenerative disc disease at L2-3.  Associated endplate sclerosis noted. IMPRESSION: 1. There is an enhancing rim along the ablation site measuring about 2 mm in thickness, without overt nodularity. The enhancement is certainly a risk factor for recurrence but typically recurrent malignancy along the ablation site is more nodular in appearance. The possibility that this represents enhancing fibrosis along the margin of the ablation site is raised as a possibility. I would suggest short-term follow up cross-sectional imaging in 6 months time in order to reassess the region; progressive thickening of the enhancement or solid nodular enhancement would be indicators of definite recurrence. 2. No adenopathy. 3. Other imaging findings of potential clinical significance: Aortic Atherosclerosis (ICD10-I70.0). Periampullary duodenal diverticulum. Old bilateral rib fractures, healed. Degenerative disc disease at L2-3. Electronically Signed   By: WVan ClinesM.D.   On: 05/01/2018 12:45   Ir Radiologist Eval & Mgmt  Result Date: 05/04/2018 Please refer to notes tab for details about interventional procedure. (Op Note)   Labs:  CBC: Recent Labs    05/24/17 1659 07/01/17 1141 10/27/17 1456 02/28/18 1206  WBC 4.6 6.4 5.8 5.6  HGB 10.0* 12.0 13.2 13.5  HCT 30.9* 37.9 40.2 40.7  PLT 159 139.0* 154.0 125.0*    COAGS: No results for input(s): INR, APTT in the last 8760 hours.  BMP: Recent Labs    05/24/17 1659 07/01/17 1141 10/27/17 1456 02/28/18 1206 05/01/18 1134  NA 137 140 143 142  --   K 3.8 4.3 4.2 4.0  --   CL 101 102 106 107  --   CO2 29 30 34* 28  --   GLUCOSE 121 83 106* 133*  --   BUN _0 --   CALCIUM 8.7 9.3 9.0 9.0  --   CREATININE 0.79 0.84 1.00 0.90 1.20*  GFRNONAA 69  --   --   --   --   GFRAA 80  --   --   --   --     LIVER FUNCTION TESTS: Recent Labs    07/01/17 1141 10/27/17 1456 02/28/18 1206  BILITOT 0.6 0.4 0.5  AST _1 ALT _2 ALKPHOS 77 66 60  PROT 6.1 6.0 6.3    ALBUMIN 3.8 3.9 3.9  Assessment and Plan:  I met with [name] [name] and her daughter.  We reviewed the follow-up CT dated 05/01/2018.  This demonstrates further retraction of the ablation zone at the level of the posterior upper left kidney.  Comment was made on the report regarding potential enhancement of a rim of tissue surrounding the ablation.  This area of thin tissue appears similar to the prior study and has simply involuted around a much smaller area of scar tissue after ablation.  I told the patient and her daughter that this is not of any concern to me for tumor recurrence.  I recommended a follow-up CT in 1 year.  Electronically Signed: Azzie Roup 05/04/2018, 5:14 PM   I spent a total of 15 Minutes in face to face in clinical consultation, greater than 50% of which was counseling/coordinating care post cryoablation of a left renal oncocytoma.

## 2018-06-15 ENCOUNTER — Other Ambulatory Visit: Payer: Self-pay | Admitting: Family Medicine

## 2018-06-20 ENCOUNTER — Ambulatory Visit (INDEPENDENT_AMBULATORY_CARE_PROVIDER_SITE_OTHER): Payer: Medicare Other | Admitting: Internal Medicine

## 2018-06-20 ENCOUNTER — Encounter: Payer: Self-pay | Admitting: Internal Medicine

## 2018-06-20 VITALS — BP 134/68 | HR 65 | Ht 65.5 in | Wt 149.0 lb

## 2018-06-20 DIAGNOSIS — I5022 Chronic systolic (congestive) heart failure: Secondary | ICD-10-CM | POA: Diagnosis not present

## 2018-06-20 DIAGNOSIS — I480 Paroxysmal atrial fibrillation: Secondary | ICD-10-CM

## 2018-06-20 DIAGNOSIS — I10 Essential (primary) hypertension: Secondary | ICD-10-CM

## 2018-06-20 DIAGNOSIS — I951 Orthostatic hypotension: Secondary | ICD-10-CM | POA: Diagnosis not present

## 2018-06-20 DIAGNOSIS — I442 Atrioventricular block, complete: Secondary | ICD-10-CM | POA: Diagnosis not present

## 2018-06-20 DIAGNOSIS — I255 Ischemic cardiomyopathy: Secondary | ICD-10-CM

## 2018-06-20 DIAGNOSIS — Z950 Presence of cardiac pacemaker: Secondary | ICD-10-CM

## 2018-06-20 NOTE — Progress Notes (Signed)
Patient Care Team: Marin Olp, MD as PCP - General (Family Medicine)   HPI  [name] [name] is a 85 y.o. female Seen in followup for syncope which was in part orthostatic and partially related to intermittent high-grade heart block. She underwent CRT P.. She has evolved device dependence  She has ischemic heart disease with prior MI and bypass surgery; prior PCI to the LAD and previously normal left ventricular function. However, on evaluation 4/14 echocardiogram demonstrated EF 25-30%. LHC 4/14 Proximal LAD stent patent, LIMA-LAD atretic, D1 occluded, proximal circumflex 30%, mid RCA occluded, SVG-D1 normal, SVG-OM2 normal, SVG-distal RCA normal, EF 40% with diffuse HK   Echocardiogram 2/15 demonstrated ejection fraction of 30-35%  DATE TEST    2/15 Echo    EF 20-25 %   12/17 Echo    EF 30-35 %   1/19 Echo  EF 45-50% Mild AR MR      She has a history of atrial fibrillation. She is on apixoban. Thromboembolic risk profile is notable for age-6 coronary artery disease-1 congestive heart failure-1 gender-1 stroke?-2 for a CHADS-VASc were greater than or equal to 5       She underwent cryoablation for a kidney lesion in December.  This was complicated by significant bleeding requiring rehospitalization as well as significant peripheral edema which is gradually improved  She has then developed complications of Mestinon therapy including insomnia.  This is improved with down titration.  She continues to struggle with orthostatic lightheadedness and fear falling.  Previous attempted therapies are outlined in the discussion below.  Denies chest pain.  No edema.  No falls.    Date cr K Hgb  9/18 0.86 4.1 12.5  1/19 0.68 3.9 9.9  7/19 1.00 4.2 13.2  11/19 1.2 4.0 13.5       Past Medical History:  Diagnosis Date  . Atrial fibrillation ([place]) 10/06/2007   post op  . COPD (chronic obstructive pulmonary disease) ([place])    Emphysema on 02/03/13 CXR  . CORONARY ARTERY  DISEASE 10/06/2007   a. s/p PCI to LAD; b. s/p CABG; c. LHC 07/13/12: Proximal LAD stent patent, LIMA-LAD atretic, D1 occluded, proximal circumflex 30%, mid RCA occluded, SVG-D1 normal, SVG-OM2 normal, SVG-distal RCA normal, EF 40% with diffuse HK  . Dizziness   . History of colonic polyps 10/30/2009   No polyps in 2011. No repeat.    Marland Kitchen HYPERLIPIDEMIA 10/06/2007  . HYPERTENSION 10/06/2007  . LBBB 09/06/2008  . Left renal mass   . MELANOMA 09/06/2008   MOES PROCEDURE RIGHT  . MYOCARDIAL INFARCTION, HX OF 10/06/2007  . NICM (nonischemic cardiomyopathy) ([place])    Echocardiogram 07/12/12: EF 25-30%, diffuse HK, mild AI, mild MR, mild LAE  . PERSONAL HX COLONIC POLYPS 10/30/2009  . Presence of permanent cardiac pacemaker   . Syncope   . UTERINE CANCER, HX OF 09/06/2008  . VARICOSE VEIN 09/29/2009    Past Surgical History:  Procedure Laterality Date  . ABDOMINAL HYSTERECTOMY  1988  . BI-VENTRICULAR PACEMAKER INSERTION (CRT-P)  02/02/2013   St. Jude, serial no. #[id number]   . CARDIAC CATHETERIZATION  08/30/2008  . CORONARY ANGIOPLASTY WITH STENT PLACEMENT  2009  . CORONARY ARTERY BYPASS GRAFT  2004  . IR RADIOLOGIST EVAL & MGMT  02/08/2017  . IR RADIOLOGIST EVAL & MGMT  05/26/2017  . IR RADIOLOGIST EVAL & MGMT  07/19/2017  . IR RADIOLOGIST EVAL & MGMT  05/04/2018  . LEFT HEART CATHETERIZATION WITH CORONARY ANGIOGRAM Bilateral 07/13/2012  Procedure: LEFT HEART CATHETERIZATION WITH CORONARY ANGIOGRAM;  Surgeon: Peter M Martinique, MD;  Location[facility] CATH LAB;  Service: Cardiovascular;  Laterality: Bilateral;  . OOPHORECTOMY    . PERMANENT PACEMAKER INSERTION N/A 02/02/2013   Procedure: PERMANENT PACEMAKER INSERTION;  Surgeon: Evans Lance, MD;  Location: [facility] CATH LAB;  Service: Cardiovascular;  Laterality: N/A;  . RADIOLOGY WITH ANESTHESIA Left 03/28/2017   Procedure: RENAL CRYO ABLATION;  Surgeon: Aletta Edouard, MD;  Location: [facility] ORS;  Service: Radiology;  Laterality: Left;  . right distal pretibeal      melanoma    Current Outpatient Medications  Medication Sig Dispense Refill  . busPIRone (BUSPAR) 5 MG tablet Take 1 tablet (5 mg total) by mouth 2 (two) times daily as needed (agitation). 60 tablet 2  . ELIQUIS 5 MG TABS tablet TAKE 1 TABLET(5 MG) BY MOUTH TWICE DAILY 60 tablet 5  . escitalopram (LEXAPRO) 20 MG tablet TAKE 1 TABLET(20 MG) BY MOUTH DAILY 90 tablet 1  . fexofenadine (ALLEGRA) 180 MG tablet Take 180 mg by mouth daily.    Marland Kitchen lisinopril (PRINIVIL,ZESTRIL) 2.5 MG tablet TAKE 1 TABLET(2.5 MG) BY MOUTH DAILY 90 tablet 2  . mirabegron ER (MYRBETRIQ) 25 MG TB24 tablet Take 25 mg by mouth daily.    Marland Kitchen pyridostigmine (MESTINON) 60 MG tablet Take 1 tablet (60 mg total) by mouth daily. 90 tablet 2   No current facility-administered medications for this visit.     Allergies  Allergen Reactions  . Aricept [Donepezil] Shortness Of Breath and Other (See Comments)    Hallucinations and loss of sensation in legs  . Nitroglycerin Other (See Comments)    Oral spray form causes rapid drop in blood pressure.   sensitive to tabs which causes rapid drop in blood pressure. Is ok to use oral spray form    Review of Systems negative except from HPI and PMH  Physical Exam BP 134/68   Pulse 65   Ht 5' 5.5" (1.664 m)   Wt 149 lb (67.6 kg)   LMP  (LMP Unknown)   SpO2 97%   BMI 24.42 kg/m  Well developed and well nourished in no acute distress HENT normal Neck supple with JVP-flat Clear Device pocket well healed; without hematoma or erythema.  There is no tethering  Regular rate and rhythm, no   gallop No murmur Abd-soft with active BS No Clubbing cyanosis  edema Skin-warm and dry A & Oriented  Grossly normal sensory and motor function  ECG AV pacing\    Assessment and  Plan  Hypertension  Complete heart block  Orthostatic intolerance  CHF chronic systolic  Anemia  Atrial fibrillation paroysmal   Pacemaker-St. Jude-CRT The patient's device was interrogated.  The  information was reviewed. No changes were made in the programming.     Ischemic cardiomyopathy with prior bypass    Previously she had not tolerated abdominal binder.  Last time we tried thigh sleeves.  She is not wearing them today.  She had not tolerated midodrine because of itching; her Mestinon dose has been limited.  I will reach out to her daughter and son-in-law and discuss trying the compressive wear again and/or considering Northera.  I worry about her propensity to falling.  Blood pressure well controlled.  No ischemia.  No significant interval atrial fibrillation  No bleeding

## 2018-06-20 NOTE — Patient Instructions (Signed)

## 2018-07-03 ENCOUNTER — Ambulatory Visit: Payer: Medicare Other | Admitting: Neurology

## 2018-07-15 ENCOUNTER — Other Ambulatory Visit: Payer: Self-pay | Admitting: Family Medicine

## 2018-07-17 NOTE — Telephone Encounter (Signed)
Last OV 02/28/2018 Last refill unknown - historical provider Next OV not scheduled  Forwarding to Dr. Yong Channel

## 2018-07-26 ENCOUNTER — Ambulatory Visit: Payer: Medicare Other | Admitting: Podiatry

## 2018-08-27 ENCOUNTER — Other Ambulatory Visit: Payer: Self-pay | Admitting: Internal Medicine

## 2018-08-28 NOTE — Telephone Encounter (Signed)
Pt is a 85 yr old female who saw Dr. Caryl Comes on 06/20/18, weight at that visit was 67.7Kg. SCr was 0.90 on 02/28/18. Will refill Eliquis 5mg  BID.

## 2018-10-28 ENCOUNTER — Other Ambulatory Visit: Payer: Self-pay | Admitting: Family Medicine

## 2018-10-30 NOTE — Telephone Encounter (Signed)
Last refill 06/15/18 #90/1  Pt due for follow-up visit.   Please contact pt to schedule.   Thanks!

## 2018-10-30 NOTE — Telephone Encounter (Signed)
LVM FOR PATIENT TO CALL BACK AND SCHEDULE F/U APPT

## 2018-10-30 NOTE — Telephone Encounter (Signed)
Per note 02/28/2018 -   Depression S:lives alone- husband died 3 years ago per her- but has been longer- issues with loneliness.  She admits depression has been a problem for her.  PHQ 9 slightly elevated today at 6.  Per caregiver-daughter [name] has recently been cutting her escitalopram in half to 10 mg as this has helped with her sweating issues A/P: Mild poor control of depression- continue lower escitalopram dose.  Encouraged her to add counseling-handout given  I am reaching out to the patient's daughter to see if she would like [place] to send in 10 mg dosage of escitalopram again.

## 2018-11-08 ENCOUNTER — Other Ambulatory Visit: Payer: Self-pay

## 2018-11-08 ENCOUNTER — Ambulatory Visit (INDEPENDENT_AMBULATORY_CARE_PROVIDER_SITE_OTHER): Payer: Medicare Other | Admitting: Podiatry

## 2018-11-08 ENCOUNTER — Encounter: Payer: Self-pay | Admitting: Podiatry

## 2018-11-08 VITALS — Temp 97.8°F

## 2018-11-08 DIAGNOSIS — D689 Coagulation defect, unspecified: Secondary | ICD-10-CM | POA: Diagnosis not present

## 2018-11-08 DIAGNOSIS — M79676 Pain in unspecified toe(s): Secondary | ICD-10-CM | POA: Diagnosis not present

## 2018-11-08 DIAGNOSIS — B351 Tinea unguium: Secondary | ICD-10-CM

## 2018-11-08 NOTE — Progress Notes (Signed)
Patient ID: [name] [name], female   DOB: 1932/02/18, 85 y.o.   MRN: [id number] Complaint:  Visit Type: Patient returns to my office for continued preventative foot care services. Complaint: Patient states" my nails have grown long and thick and become painful to walk and wear shoes" . The patient presents for preventative foot care services. No changes to ROS.    Podiatric Exam: Vascular: dorsalis pedis and posterior tibial pulses are palpable bilateral. Capillary return is immediate. Temperature gradient is WNL. Skin turgor WNL  Sensorium: Normal Semmes Weinstein monofilament test. Normal tactile sensation bilaterally. Nail Exam: Pt has thick disfigured discolored nails with subungual debris noted bilateral entire nail hallux through fifth toenails Ulcer Exam: There is no evidence of ulcer or pre-ulcerative changes or infection. Orthopedic Exam: Muscle tone and strength are WNL. No limitations in general ROM. No crepitus or effusions noted. Foot type and digits show no abnormalities.  Skin:  . No infection or ulcers  Diagnosis:  Onychomycosis, , Pain in right toe, pain in left toes   Treatment & Plan Procedures and Treatment: Consent by patient was obtained for treatment procedures. The patient understood the discussion of treatment and procedures well. All questions were answered thoroughly reviewed. Debridement of mycotic and hypertrophic toenails, 1 through 5 bilateral and clearing of subungual debris. No ulceration, no infection noted.   Return Visit-Office Procedure: Patient instructed to return to the office for a follow up visit 3 months   for continued evaluation and treatment.    Gardiner Barefoot DPM

## 2018-11-14 ENCOUNTER — Other Ambulatory Visit: Payer: Self-pay | Admitting: Internal Medicine

## 2018-11-14 ENCOUNTER — Other Ambulatory Visit: Payer: Self-pay | Admitting: Family Medicine

## 2018-12-16 ENCOUNTER — Other Ambulatory Visit: Payer: Self-pay | Admitting: Internal Medicine

## 2018-12-26 ENCOUNTER — Encounter: Payer: Medicare Other | Admitting: Internal Medicine

## 2019-01-01 ENCOUNTER — Other Ambulatory Visit: Payer: Self-pay | Admitting: Family Medicine

## 2019-01-01 MED ORDER — ESCITALOPRAM OXALATE 20 MG PO TABS: ORAL_TABLET | ORAL | 1 refills | Status: DC

## 2019-01-01 NOTE — Progress Notes (Signed)
From email   Hi Dr. Elie Confer,  I was out of the office on a vacation day (running kids to doctors appointments) on Friday so sorry I missed you then. I looked back this morning and it looks like there was a request on 10/30/18- I think they just called [name] [name] and did not reach out to your or [name] [name]- with her memory issues hopefully they will reach out to one of you two next visit (I suppose if yall wanted to change demographics to one of your phone #s we could if shes ok with it as well). I refilled medicine today. last visit was in November 2019- probably would be good to set up a follow up visit in next few months- if yall wanted to come with her in person then we could try to update bloodwork or we could start with virtual and decide on bloodwork later.   Thanks,  Duane Boston, Vicki Mallet  10/30/18 2:54 PM Note   LVM FOR PATIENT TO CALL BACK AND SCHEDULE F/U APPT    [name], CMA to [name]  1:04 PM Note    Last refill 06/15/18 #90/1   Pt due for follow-up visit.    Please contact pt to schedule.    Thanks!       From: Jorene [email or web address]>  Subject: From Pharmacist Jorene Guest SECURE Importance: High  Hi Dr. Yong Channel. I attempted to call-in a refill for [name] [name]'s escitalopram 20mg  #90, Sig: One tablet by mouth daily. [facility] shows no refills.  Rx number:  [phone]. The pharmacy ([facility] [address], phone number [phone]) stated they had attempted to reach you in June?/July? (can't remember which they said) but they never heard a response back (so they say) and "the prescription was closed." (I asked what this meant---and essentially, they said they would need a new prescription). If we need to set up a tele-visit or have an in-person visit for [name] [name] DOB [birth year], please advise.   Thanks,  Canary Brim B. Ceasar Lund, PharmD, CACP, CPP

## 2019-01-24 IMAGING — CT CT BIOPSY
1 of 11 series · 6 of 32 positions shown, 11 images · non-contrast
Comparison: none

CLINICAL DATA: 3.4 cm mass of the left kidney with imaging
characteristics consistent with renal carcinoma. The patient
presents for biopsy and ablation under CT guidance.

[Series 2: i-spiral 5.0 b31f · axial · 0.87mm/px · z∈[+1246,+1386]mm · 6 of 57 slices shown, 11 images]
[im 9/57  soft-tissue]
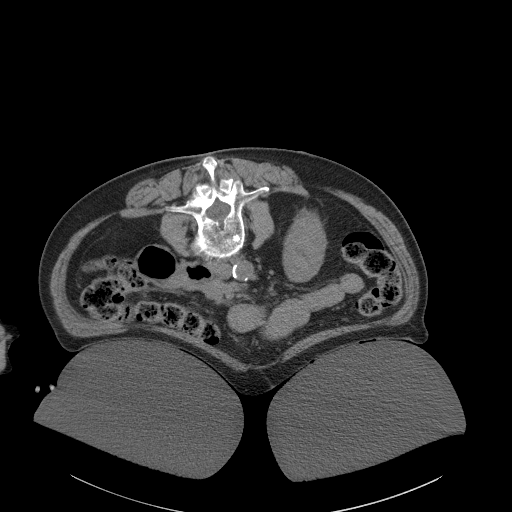
[im 9/57  bone]
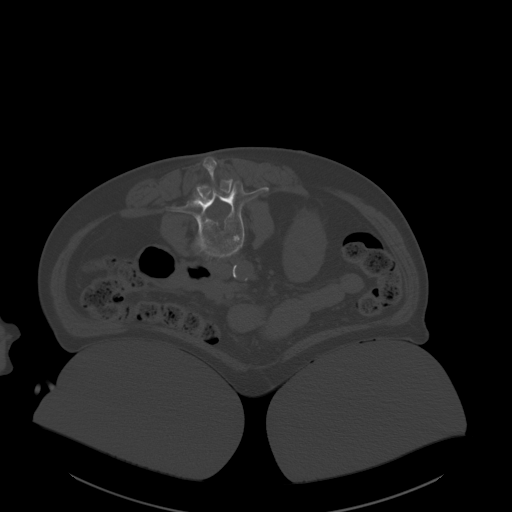
[im 17/57  soft-tissue]
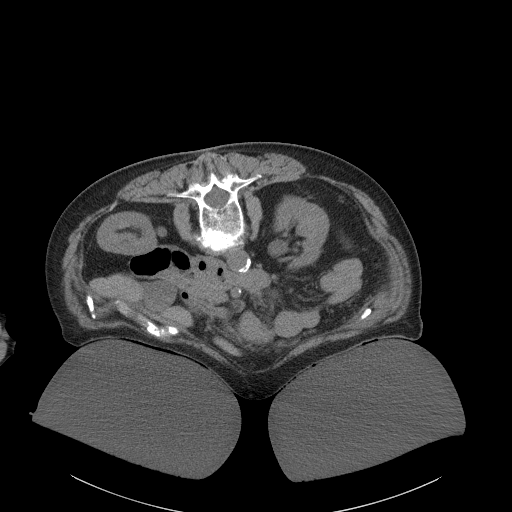
[im 25/57  soft-tissue]
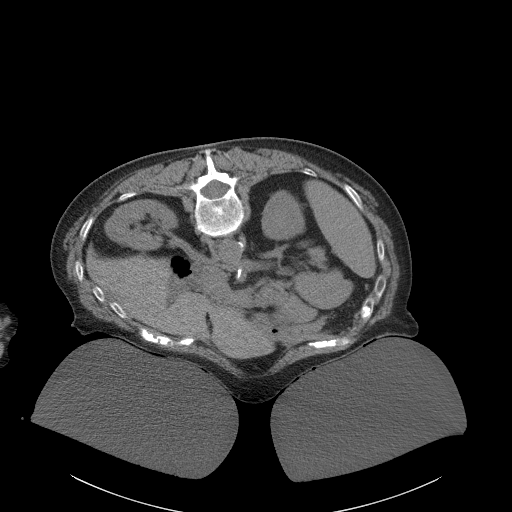
[im 25/57  lung]
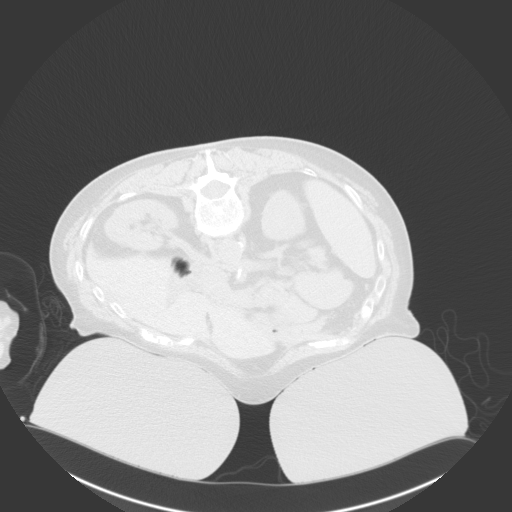
[im 33/57  soft-tissue]
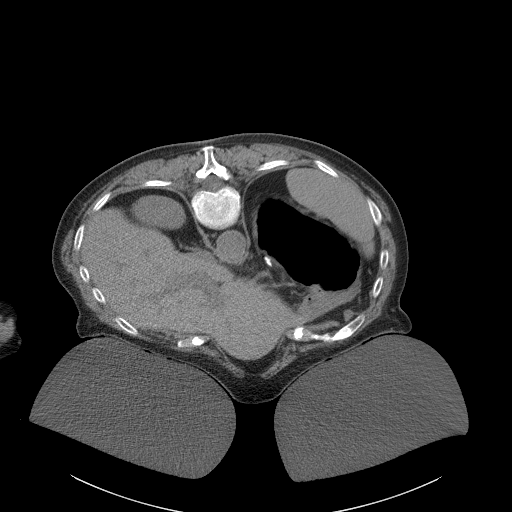
[im 33/57  lung]
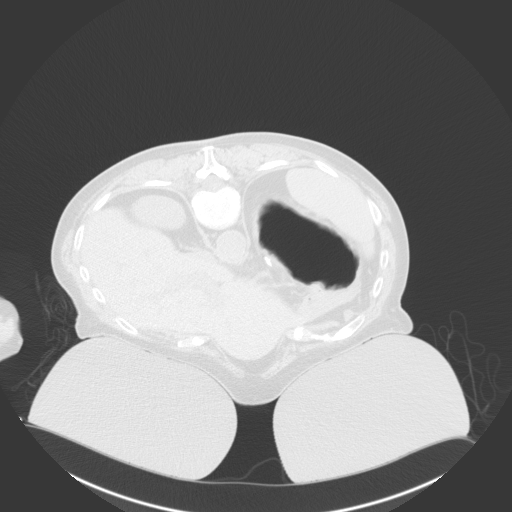
[im 41/57  soft-tissue]
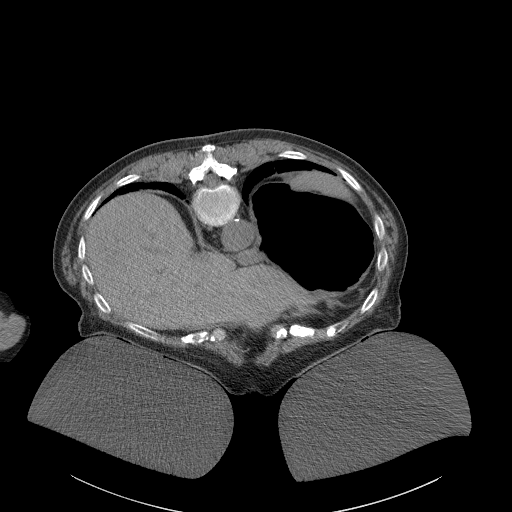
[im 41/57  lung]
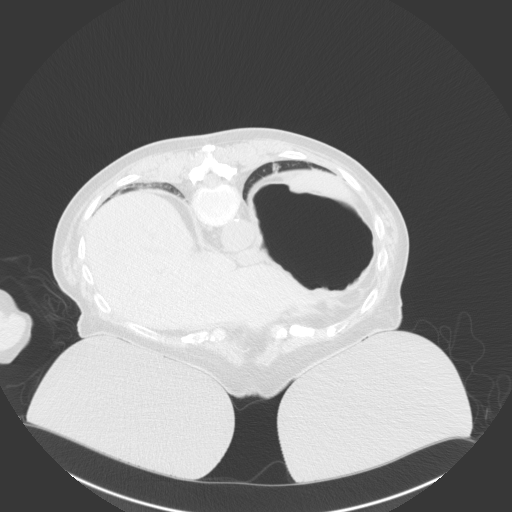
[im 49/57  soft-tissue]
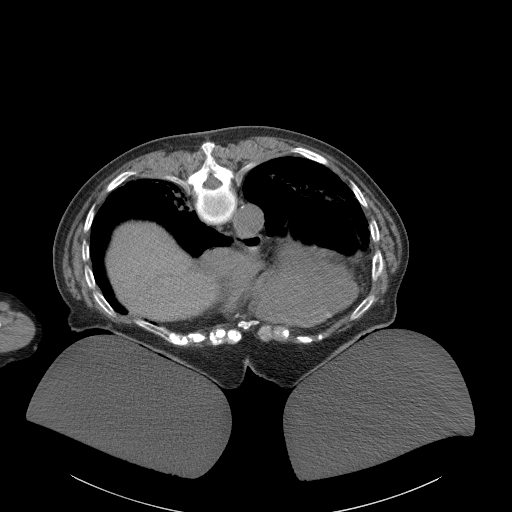
[im 49/57  lung]
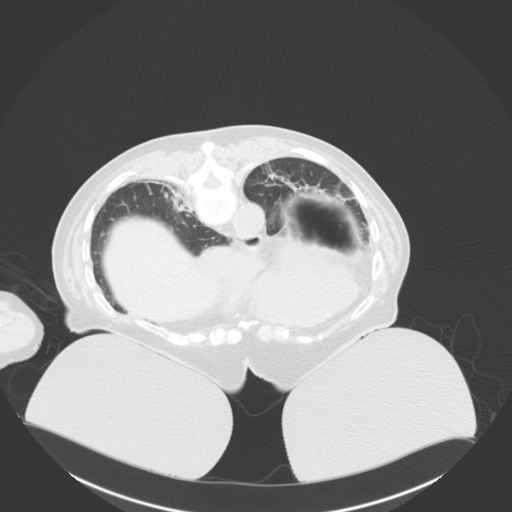

[6 of 32 positions shown; findings below may reference images not displayed]

EXAM:
CT-GUIDED CORE BIOPSY OF LEFT RENAL MASS

CT-GUIDED PERCUTANEOUS CRYOABLATION OF LEFT RENAL MASS

ANESTHESIA/SEDATION:
General

MEDICATIONS:
2 g IV Ancef. The antibiotic was administered in an appropriate time
interval prior to needle puncture of the skin.

CONTRAST:  70mL OLBKBP-H15 IOPAMIDOL (OLBKBP-H15) INJECTION 76%

PROCEDURE:
The procedure, risks, benefits, and alternatives were explained to
the patient. Questions regarding the procedure were encouraged and
answered. The patient understands and consents to the procedure. A
time-out was performed prior to initiating the procedure.

The patient was placed under general anesthesia. Initial unenhanced
CT was performed in a prone position to localize the left kidney.

The patient was prepped with chlorhexidine in a sterile fashion, and
a sterile drape was applied covering the operative field. A sterile
gown and sterile gloves were used for the procedure.

Contrast enhanced CT was performed of the kidneys with
administration of IV contrast.

A 17 gauge trocar needle was advanced into the left renal mass. Core
biopsy was performed with an 18 gauge automated core biopsy device.
A single core biopsy sample was submitted in formalin for pathologic
analysis. After biopsy, a slurry of Gel-Foam pledgets mixed in
sterile saline was injected through the 17 gauge needle as it was
removed.

Under CT guidance, a series of 3 separate Galil Ice Vianey Priestley
percutaneous cryoablation probes were advanced into the left renal
mass. Probe positioning was confirmed by CT prior to cryoablation.

Cryoablation was performed through the 3 probes simultaneously.
Initial 10 minute cycle of cryoablation was performed. This was
followed by a 8 minute thaw cycle. A second 10 minute cycle of
cryoablation was then performed. During ablation, periodic CT
imaging was performed to monitor ice ball formation and morphology.
After active thaw, a 30 second cautery cycle was performed and the
cryoablation probes were then removed.

COMPLICATIONS:
Perinephric hemorrhage and hematuria.

SIR level A: No therapy, no consequence.
FINDINGS: Posterior mid to lower left renal mass was localized and after
administration of contrast demonstrates avid enhancement. The mass
measures approximately 3 x 3.5 cm in greatest transverse dimensions.
Core biopsy yielded solid tissue. During biopsy and cryoablation
probe placement, there was some perinephric bleeding present
posterior to the kidney. Once ablation was begun, the amount of
bleeding did not appear to increase significantly.

During ablation, CT demonstrates adequate ice ball formation that
appears to encompass the margins of the tumor. During the second
freeze, it did become evident that there was development of some
hematuria in the Foley catheter drainage bag. This will be followed.
IMPRESSION: CT guided core biopsy and cryoablation of 3.5 cm left renal mass.
The procedure was complicated by posterior perinephric hemorrhage
and hematuria. The patient will be observed overnight. Initial
follow-up will be performed in approximately 4 weeks.

## 2019-02-08 ENCOUNTER — Other Ambulatory Visit: Payer: Self-pay

## 2019-02-08 ENCOUNTER — Telehealth (INDEPENDENT_AMBULATORY_CARE_PROVIDER_SITE_OTHER): Payer: Medicare Other | Admitting: Internal Medicine

## 2019-02-08 VITALS — BP 116/52 | HR 79 | Ht 66.0 in | Wt 146.0 lb

## 2019-02-08 DIAGNOSIS — I255 Ischemic cardiomyopathy: Secondary | ICD-10-CM | POA: Diagnosis not present

## 2019-02-08 DIAGNOSIS — I10 Essential (primary) hypertension: Secondary | ICD-10-CM

## 2019-02-08 DIAGNOSIS — Z950 Presence of cardiac pacemaker: Secondary | ICD-10-CM | POA: Diagnosis not present

## 2019-02-08 DIAGNOSIS — I951 Orthostatic hypotension: Secondary | ICD-10-CM | POA: Diagnosis not present

## 2019-02-08 NOTE — Progress Notes (Signed)
Electrophysiology TeleHealth Note   Due to national recommendations of social distancing due to COVID 19, an audio/video telehealth visit is felt to be most appropriate for this patient at this time.  See MyChart message from today for the patient's consent to telehealth for [facility].   Date:  02/08/2019   ID:  [name] [name], DOB March 14, 1932, MRN [id number]  Location: patient's home  Provider location: [address] [place]  Evaluation Performed: Follow-up visit  PCP:  Marin Olp, MD  Cardiologist:     Electrophysiologist:  SK   Chief Complaint:     History of Present Illness:    [name] [name] is a 85 y.o. female who presents via audio/video conferencing for a telehealth visit today.  Since last being seen in our clinic for torthostatic hypotension, ICM s/p CABG and implanted CRT-P , the patient reports dizziness and struggling with being caged at home   No sob; edema  No palpitations; no ischemia   She has ischemic heart disease with prior MI and bypass surgery; prior PCI to the LAD and previously normal left ventricular function. However, on evaluation 4/14 echocardiogram demonstrated EF 25-30%. LHC 4/14 Proximal LAD stent patent, LIMA-LAD atretic, D1 occluded, proximal circumflex 30%, mid RCA occluded, SVG-D1 normal, SVG-OM2 normal, SVG-distal RCA normal,   Not able to tolerate abdominal binder    DATE TEST EF   2/15 Echo   20-25 %   12/17 Echo   30-35 %   1/19 Echo   45-50% Mild AR MR    Date Cr K Hgb  1/20   1.2 4.0 13.5       She is on apixoban. Thromboembolic risk profile is notable for age-56 coronary artery disease-1 congestive heart failure-1 gender-1 stroke?-2 for a CHADS-VASc were greater than or equal to 5    The patient denies symptoms of fevers, chills, cough, or new SOB worrisome for COVID 19. *  Past Medical History:  Diagnosis Date  . Atrial fibrillation ([place]) 10/06/2007   post op  . COPD (chronic obstructive  pulmonary disease) ([place])    Emphysema on 02/03/13 CXR  . CORONARY ARTERY DISEASE 10/06/2007   a. s/p PCI to LAD; b. s/p CABG; c. LHC 07/13/12: Proximal LAD stent patent, LIMA-LAD atretic, D1 occluded, proximal circumflex 30%, mid RCA occluded, SVG-D1 normal, SVG-OM2 normal, SVG-distal RCA normal, EF 40% with diffuse HK  . Dizziness   . History of colonic polyps 10/30/2009   No polyps in 2011. No repeat.    Marland Kitchen HYPERLIPIDEMIA 10/06/2007  . HYPERTENSION 10/06/2007  . LBBB 09/06/2008  . Left renal mass   . MELANOMA 09/06/2008   MOES PROCEDURE RIGHT  . MYOCARDIAL INFARCTION, HX OF 10/06/2007  . NICM (nonischemic cardiomyopathy) ([place])    Echocardiogram 07/12/12: EF 25-30%, diffuse HK, mild AI, mild MR, mild LAE  . PERSONAL HX COLONIC POLYPS 10/30/2009  . Presence of permanent cardiac pacemaker   . Syncope   . UTERINE CANCER, HX OF 09/06/2008  . VARICOSE VEIN 09/29/2009    Past Surgical History:  Procedure Laterality Date  . ABDOMINAL HYSTERECTOMY  1988  . BI-VENTRICULAR PACEMAKER INSERTION (CRT-P)  02/02/2013   St. Jude, serial no. [id number]   . CARDIAC CATHETERIZATION  08/30/2008  . CORONARY ANGIOPLASTY WITH STENT PLACEMENT  2009  . CORONARY ARTERY BYPASS GRAFT  2004  . IR RADIOLOGIST EVAL & MGMT  02/08/2017  . IR RADIOLOGIST EVAL & MGMT  05/26/2017  . IR RADIOLOGIST EVAL & MGMT  07/19/2017  .  IR RADIOLOGIST EVAL & MGMT  05/04/2018  . LEFT HEART CATHETERIZATION WITH CORONARY ANGIOGRAM Bilateral 07/13/2012   Procedure: LEFT HEART CATHETERIZATION WITH CORONARY ANGIOGRAM;  Surgeon: Peter M Martinique, MD;  Location: [facility] CATH LAB;  Service: Cardiovascular;  Laterality: Bilateral;  . OOPHORECTOMY    . PERMANENT PACEMAKER INSERTION N/A 02/02/2013   Procedure: PERMANENT PACEMAKER INSERTION;  Surgeon: Evans Lance, MD;  Location: [facility] CATH LAB;  Service: Cardiovascular;  Laterality: N/A;  . RADIOLOGY WITH ANESTHESIA Left 03/28/2017   Procedure: RENAL CRYO ABLATION;  Surgeon: Aletta Edouard, MD;  Location: [facility] ORS;   Service: Radiology;  Laterality: Left;  . right distal pretibeal     melanoma    Current Outpatient Medications  Medication Sig Dispense Refill  . busPIRone (BUSPAR) 5 MG tablet Take 1 tablet (5 mg total) by mouth 2 (two) times daily as needed (agitation). 60 tablet 2  . ELIQUIS 5 MG TABS tablet TAKE 1 TABLET(5 MG) BY MOUTH TWICE DAILY 60 tablet 6  . escitalopram (LEXAPRO) 20 MG tablet TAKE 1 TABLET(20 MG) BY MOUTH DAILY 90 tablet 1  . fexofenadine (ALLEGRA) 180 MG tablet Take 180 mg by mouth daily.    Marland Kitchen lisinopril (ZESTRIL) 2.5 MG tablet TAKE 1 TABLET(2.5 MG) BY MOUTH DAILY 90 tablet 1  . pyridostigmine (MESTINON) 60 MG tablet TAKE 1 TABLET(60 MG) BY MOUTH DAILY 90 tablet 2  . MYRBETRIQ 25 MG TB24 tablet TAKE 1 TABLET(25 MG) BY MOUTH DAILY (Patient not taking: Reported on 02/08/2019) 90 tablet 3   No current facility-administered medications for this visit.     Allergies:   Aricept [donepezil] and Nitroglycerin   Social History:  The patient  reports that she has never smoked. She has never used smokeless tobacco. She reports current alcohol use. She reports that she does not use drugs.   Family History:  The patient's   family history includes Heart attack in her father; Stroke in her mother.   ROS:  Please see the history of present illness.   All other systems are personally reviewed and negative.    Exam:    Vital Signs:  BP (!) 116/52   Pulse 79   Ht 5\' 6"  (1.676 m)   Wt 146 lb (66.2 kg)   LMP  (LMP Unknown)   BMI 23.57 kg/m     Well appearing, alert and conversant, regular work of breathing,  good skin color Eyes- anicteric, neuro- grossly intact, skin- no apparent rash or lesions or cyanosis, mouth- oral mucosa is pink   Labs/Other Tests and Data Reviewed:    Recent Labs: 02/28/2018: ALT 9; BUN 16; Hemoglobin 13.5; Platelets 125.0; Potassium 4.0; Sodium 142; TSH 1.80 05/01/2018: Creatinine, Ser 1.20   Wt Readings from Last 3 Encounters:  02/08/19 146 lb (66.2  kg)  06/20/18 149 lb (67.6 kg)  02/28/18 149 lb 9.6 oz (67.9 kg)     Other studies personally reviewed: Additional studies/ records that were reviewed today include: *As above   Last device remote is reviewed from [facility] PDF dated 3/20  which reveals normal device function,   arrhythmias - some atrial tachycardia    ASSESSMENT & PLAN:   Hypertension  Complete heart block  Orthostatic intolerance  CHF chronic systolic  Anemia  Atrial fibrillation paroysmal   Pacemaker-St. Jude-CRT    Ischemic cardiomyopathy  Without symptoms of ischemia  Orthostatic lightheadedness is again a problem.  Discussed thigh sleeves as opposed to ProAmatine which she had tried prior to her pacemaker implantation and  with which she has seen no benefit.  Higher doses of pyridostigmine have not been tolerated.  Blood pressures at home have been relatively low.  We will have them check a.m. blood pressures to see whether fludrocortisone might be an alternative  Encouraged ambulation  Needs blood work; no clinical bleeding on Eliquis    COVID 19 screen The patient denies symptoms of COVID 19 at this time.  The importance of social distancing was discussed today.  Follow-up: 53m  Next remote: soon   Current medicines are reviewed at length with the patient today.   The patient does not have concerns regarding her medicines.  The following changes were made today:  none  Labs/ tests ordered today include:BMET CBC And can home health draw the labs and can they adminsiter the flu shot No orders of the defined types were placed in this encounter.   Future tests ( post COVID )    Patient Risk:  after full review of this patients clinical status, I feel that they are at moderate risk at this time.  Today, I have spent 23 minutes with the patient with telehealth technology discussing the above.  Signed, Virl Axe, MD  02/08/2019 3:08 PM     [facility] [address]  [place] [address] [phone] (office) [phone] (fax)

## 2019-02-21 ENCOUNTER — Encounter: Payer: Self-pay | Admitting: Podiatry

## 2019-02-21 ENCOUNTER — Ambulatory Visit (INDEPENDENT_AMBULATORY_CARE_PROVIDER_SITE_OTHER): Payer: Medicare Other | Admitting: Podiatry

## 2019-02-21 ENCOUNTER — Telehealth: Payer: Self-pay | Admitting: *Deleted

## 2019-02-21 ENCOUNTER — Other Ambulatory Visit: Payer: Self-pay

## 2019-02-21 DIAGNOSIS — Q828 Other specified congenital malformations of skin: Secondary | ICD-10-CM | POA: Diagnosis not present

## 2019-02-21 DIAGNOSIS — I5022 Chronic systolic (congestive) heart failure: Secondary | ICD-10-CM

## 2019-02-21 DIAGNOSIS — D689 Coagulation defect, unspecified: Secondary | ICD-10-CM | POA: Diagnosis not present

## 2019-02-21 DIAGNOSIS — M79676 Pain in unspecified toe(s): Secondary | ICD-10-CM

## 2019-02-21 DIAGNOSIS — B351 Tinea unguium: Secondary | ICD-10-CM

## 2019-02-21 DIAGNOSIS — I951 Orthostatic hypotension: Secondary | ICD-10-CM

## 2019-02-21 NOTE — Telephone Encounter (Signed)
Home Visit Request  Requesting provider:   Virl Axe  Please specify if there is a specific agency the provider is requesting:   [facility] (Labs, IV Lasix)  Reason for home visit:   Labs  Is this the initial request or a follow up?:   Initial Request  Services requested:   Labs:  BMET & CBC  When services are needed:   Within next month  # of visits/frequency requested:   1  Stanton Kidney, RN  02/21/2019 5:50 PM

## 2019-02-21 NOTE — Progress Notes (Signed)
Patient ID: [name] [name], female   DOB: 01-27-1932, 85 y.o.   MRN: [id number] Complaint:  Visit Type: Patient returns to my office for continued preventative foot care services. Complaint: Patient states" my nails have grown long and thick and become painful to walk and wear shoes" . The patient presents for preventative foot care services. No changes to ROS.    Podiatric Exam: Vascular: dorsalis pedis and posterior tibial pulses are palpable bilateral. Capillary return is immediate. Temperature gradient is WNL. Skin turgor WNL  Sensorium: Normal Semmes Weinstein monofilament test. Normal tactile sensation bilaterally. Nail Exam: Pt has thick disfigured discolored nails with subungual debris noted bilateral entire nail hallux through fifth toenails Ulcer Exam: There is no evidence of ulcer or pre-ulcerative changes or infection. Orthopedic Exam: Muscle tone and strength are WNL. No limitations in general ROM. No crepitus or effusions noted. Foot type and digits show no abnormalities.  Skin:  . No infection or ulcers.  Porokeratosis sub 3 right.  Diagnosis:  Onychomycosis, , Pain in right toe, pain in left toes  Porokeratosis sub 3 right  Treatment & Plan Procedures and Treatment: Consent by patient was obtained for treatment procedures. The patient understood the discussion of treatment and procedures well. All questions were answered thoroughly reviewed. Debridement of mycotic and hypertrophic toenails, 1 through 5 bilateral and clearing of subungual debris. No ulceration, no infection noted. Debride porokeratosis right.  Return Visit-Office Procedure: Patient instructed to return to the office for a follow up visit 3 months   for continued evaluation and treatment.    Gardiner Barefoot DPM

## 2019-02-22 NOTE — Telephone Encounter (Signed)
[facility] Visit Initial Request  Date of Request ([place]):  February 22, 2019  Requesting Provider:  Virl Axe, MD    Agency Requested:    [facility] Contact:  Glory Buff, NP [address] [place], [place] [address] Phone #:  [phone] Fax #:  [phone]  Patient Demographic Information: Name:  [name] [name] Age:  85 y.o.   DOB:  03-12-1932  MRN:  [id number]   Address:   [place] [address]   Phone Numbers:   Home Phone [phone]  Mobile [phone]     Emergency Contact Information on File:   Contact Information    Name Relation Home Work [place] Daughter [phone]   Carreiro,karen Daughter   [phone]      The above family members may be contacted for information on this patient (review DPR on file):  Yes    Patient Clinical Information:  Primary Care Provider:  Marin Olp, MD  Primary Cardiologist:  No primary care provider on file.  Primary Electrophysiologist:  None   Past Medical Hx: Ms. Demanche  has a past medical history of Atrial fibrillation ([place]) (10/06/2007), COPD (chronic obstructive pulmonary disease) ([place]), CORONARY ARTERY DISEASE (10/06/2007), Dizziness, History of colonic polyps (10/30/2009), HYPERLIPIDEMIA (10/06/2007), HYPERTENSION (10/06/2007), LBBB (09/06/2008), Left renal mass, MELANOMA (09/06/2008), MYOCARDIAL INFARCTION, HX OF (10/06/2007), NICM (nonischemic cardiomyopathy) ([place]), PERSONAL HX COLONIC POLYPS (10/30/2009), Presence of permanent cardiac pacemaker, Syncope, UTERINE CANCER, HX OF (09/06/2008), and VARICOSE VEIN (09/29/2009).   Allergies: She is allergic to aricept [donepezil] and nitroglycerin.   Medications: Current Outpatient Medications on File Prior to Visit  Medication Sig  . busPIRone (BUSPAR) 5 MG tablet Take 1 tablet (5 mg total) by mouth 2 (two) times daily as needed (agitation).  Marland Kitchen ELIQUIS 5 MG TABS tablet  TAKE 1 TABLET(5 MG) BY MOUTH TWICE DAILY  . escitalopram (LEXAPRO) 20 MG tablet TAKE 1 TABLET(20 MG) BY MOUTH DAILY  . fexofenadine (ALLEGRA) 180 MG tablet Take 180 mg by mouth daily.  Marland Kitchen lisinopril (ZESTRIL) 2.5 MG tablet TAKE 1 TABLET(2.5 MG) BY MOUTH DAILY  . MYRBETRIQ 25 MG TB24 tablet TAKE 1 TABLET(25 MG) BY MOUTH DAILY  . pyridostigmine (MESTINON) 60 MG tablet TAKE 1 TABLET(60 MG) BY MOUTH DAILY   No current facility-administered medications on file prior to visit.      Social Hx: She  reports that she has never smoked. She has never used smokeless tobacco. She reports current alcohol use. She reports that she does not use drugs.    Diagnosis/Reason for Visit:   ORTHOSTATIC HYPOTENSION, ISCHEMIC CARDIOMYOPATHY  Services Requested:  Vital Signs (BP, Pulse, O2, Weight)  Physical Exam  Medication Reconciliation  Labs:  BMET, CBC  # of Visits Needed/Frequency per Week: 1 VISIT IS NEEDED WITHIN THE NEXT MONTH

## 2019-02-27 LAB — BASIC METABOLIC PANEL
BUN/Creatinine Ratio: 19 (ref 12–28)
BUN: 16 mg/dL (ref 8–27)
CO2: 20 mmol/L (ref 20–29)
Calcium: 8.8 mg/dL (ref 8.7–10.3)
Chloride: 106 mmol/L (ref 96–106)
Creatinine, Ser: 0.83 mg/dL (ref 0.57–1.00)
GFR calc Af Amer: 74 mL/min/{1.73_m2} (ref >59)
GFR calc non Af Amer: 65 mL/min/{1.73_m2} (ref >59)
Glucose: 100 mg/dL — ABNORMAL HIGH (ref 65–99)
Potassium: 4.3 mmol/L (ref 3.5–5.2)
Sodium: 142 mmol/L (ref 134–144)

## 2019-02-27 LAB — CBC
Hematocrit: 39.7 % (ref 34.0–46.6)
Hemoglobin: 13.3 g/dL (ref 11.1–15.9)
MCH: 29.8 pg (ref 26.6–33.0)
MCHC: 33.5 g/dL (ref 31.5–35.7)
MCV: 89 fL (ref 79–97)
Platelets: 127 10*3/uL — ABNORMAL LOW (ref 150–450)
RBC: 4.47 x10E6/uL (ref 3.77–5.28)
RDW: 13 % (ref 11.7–15.4)
WBC: 5 10*3/uL (ref 3.4–10.8)

## 2019-02-27 NOTE — Progress Notes (Signed)
[name] [name] DOB: [birth year]  Purpose of Visit: VS, Wt, Exam, Med Rec, Labs  Ordering provider: [facility]  Medications: Is the patient taking all medications listed on MAR from Epic? Yes * Son-in-law is pharmacist w/[facility] and has her medications in a locked med box w/a timer releasing meds at the proper time.        Physical Exam:  Lung sounds: clear  Heart sounds: regular  Peripheral edema: no  Wounds: no    Any patient concerns?  No, however [name] [name] was confused and frustrated about the home visit saying she didn't know about it, though the [facility] aide did tell her.  [facility] aide said [name] [name] has been having episodes of crying spells along w/"hot flashes" that require her to change her clothes.    [facility] aide said they often will walk in the cul-de-sac or dance to music for exercise.    Labs drawn (cbc, bmp) & taken to the [facility] on [address], RN 02/27/19

## 2019-03-19 ENCOUNTER — Other Ambulatory Visit: Payer: Self-pay

## 2019-03-20 ENCOUNTER — Ambulatory Visit (INDEPENDENT_AMBULATORY_CARE_PROVIDER_SITE_OTHER): Payer: Medicare Other | Admitting: Family Medicine

## 2019-03-20 ENCOUNTER — Encounter: Payer: Self-pay | Admitting: Family Medicine

## 2019-03-20 VITALS — BP 126/74 | HR 72 | Temp 98.1°F | Ht 66.0 in | Wt 147.8 lb

## 2019-03-20 DIAGNOSIS — F324 Major depressive disorder, single episode, in partial remission: Secondary | ICD-10-CM | POA: Diagnosis not present

## 2019-03-20 DIAGNOSIS — N3281 Overactive bladder: Secondary | ICD-10-CM

## 2019-03-20 DIAGNOSIS — R410 Disorientation, unspecified: Secondary | ICD-10-CM

## 2019-03-20 DIAGNOSIS — J449 Chronic obstructive pulmonary disease, unspecified: Secondary | ICD-10-CM | POA: Diagnosis not present

## 2019-03-20 DIAGNOSIS — Z23 Encounter for immunization: Secondary | ICD-10-CM | POA: Diagnosis not present

## 2019-03-20 DIAGNOSIS — R319 Hematuria, unspecified: Secondary | ICD-10-CM | POA: Diagnosis not present

## 2019-03-20 LAB — POC URINALSYSI DIPSTICK (AUTOMATED)
Bilirubin, UA: NEGATIVE
Glucose, UA: NEGATIVE
Ketones, UA: NEGATIVE
Leukocytes, UA: NEGATIVE
Nitrite, UA: NEGATIVE
Protein, UA: NEGATIVE
Spec Grav, UA: >=1.03 — AB (ref 1.010–1.025)
Urobilinogen, UA: 0.2 E.U./dL
pH, UA: 5.5 (ref 5.0–8.0)

## 2019-03-20 MED ORDER — BUSPIRONE HCL 5 MG PO TABS: 5.0000 mg | ORAL_TABLET | Freq: Two times a day (BID) | ORAL | 2 refills | Status: DC | PRN

## 2019-03-20 NOTE — Progress Notes (Signed)
Phone [phone] In person visit   Subjective:   [name] [name] is a 85 y.o. year old very pleasant female patient who presents for/with See problem oriented charting Chief Complaint  Patient presents with   Urinary Tract Infection   ROS-complains of some depressed mood and anhedonia.  No shortness of breath or chest pain reported.  Reports feelings of loneliness and isolation due to Covid  This visit occurred during the SARS-CoV-2 public health emergency.  Safety protocols were in place, including screening questions prior to the visit, additional usage of staff PPE, and extensive cleaning of exam room while observing appropriate contact time as indicated for disinfecting solutions.   Past Medical History-  Patient Active Problem List   Diagnosis Date Noted   Left renal mass 01/27/2017    Priority: High   CAD (coronary artery disease) 04/17/2014    Priority: High   Mild cognitive impairment 04/17/2014    Priority: High   Pacemaker -CRT-St. Jude 05/21/2013    Priority: High   Atrioventricular block, complete ([place]) 03/08/2013    Priority: High   Paroxysmal atrial fibrillation ([place]) 02/03/2013    Priority: High   Chronic combined systolic and diastolic congestive heart failure ([place]) 10/10/2012    Priority: High   Ischemic cardiomyopathy  EF 30% cath 4/14 with stent 10/06/2007    Priority: High   Overactive bladder 07/09/2016    Priority: Medium   Dizziness 05/07/2016    Priority: Medium   Microscopic hematuria 09/02/2015    Priority: Medium   Asthma, intrinsic 02/03/2013    Priority: Medium   Depression 09/19/2012    Priority: Medium   History of melanoma 09/06/2008    Priority: Medium   History of uterine cancer 09/06/2008    Priority: Medium   Hyperlipidemia 10/06/2007    Priority: Medium   Essential hypertension 10/06/2007    Priority: Medium   Allergic rhinitis 04/17/2014    Priority: Low   Hearing loss 04/17/2014    Priority: Low    Varicose veins 09/29/2009    Priority: Low   LBBB (left bundle branch block) 09/06/2008    Priority: Low   Chronic obstructive pulmonary disease ([place]) 03/20/2019   Major depressive disorder with single episode, in partial remission ([place]) 03/20/2019   Arthritis of carpometacarpal (CMC) joint of left thumb 06/02/2017   Current use of long term anticoagulation 06/02/2017   HCAP (healthcare-associated pneumonia) 04/04/2017   Traumatic perinephric hematoma of left kidney 03/29/2017   Acute blood loss as cause of postoperative anemia 03/29/2017    Medications- reviewed and updated Current Outpatient Medications  Medication Sig Dispense Refill   ELIQUIS 5 MG TABS tablet TAKE 1 TABLET(5 MG) BY MOUTH TWICE DAILY 60 tablet 6   escitalopram (LEXAPRO) 20 MG tablet TAKE 1 TABLET(20 MG) BY MOUTH DAILY 90 tablet 1   fexofenadine (ALLEGRA) 180 MG tablet Take 180 mg by mouth daily.     lisinopril (ZESTRIL) 2.5 MG tablet TAKE 1 TABLET(2.5 MG) BY MOUTH DAILY 90 tablet 1   pyridostigmine (MESTINON) 60 MG tablet TAKE 1 TABLET(60 MG) BY MOUTH DAILY 90 tablet 2   busPIRone (BUSPAR) 5 MG tablet Take 1 tablet (5 mg total) by mouth 2 (two) times daily as needed (agitation). 60 tablet 2   No current facility-administered medications for this visit.      Objective:  BP 126/74    Pulse 72    Temp 98.1 F (36.7 C) (Temporal)    Ht 5\' 6"  (1.676 m)    Wt 147  lb 12.8 oz (67 kg)    LMP  (LMP Unknown)    SpO2 97%    BMI 23.86 kg/m  Gen: NAD, resting comfortably CV: Irregularly irregular without rubs or gallops Lungs: CTAB no crackles, wheeze, rhonchi Abdomen: soft/nontender other than mild suprapubic tenderness/nondistended/normal bowel sounds. No rebound or guarding.  Ext: no edema Skin: warm, dry Neuro: Can be repetitive in speech at times-cycles back to prior thoughts frequently.   Results for orders placed or performed in visit on 03/20/19 (from the past 24 hour(s))  POCT Urinalysis Dipstick  (Automated)     Status: Abnormal   Collection Time: 03/20/19  3:43 PM  Result Value Ref Range   Color, UA yellow    Clarity, UA clear    Glucose, UA Negative Negative   Bilirubin, UA N    Ketones, UA N    Spec Grav, UA >=1.030 (A) 1.010 - 1.025   Blood, UA 2+    pH, UA 5.5 5.0 - 8.0   Protein, UA Negative Negative   Urobilinogen, UA 0.2 0.2 or 1.0 E.U./dL   Nitrite, UA N    Leukocytes, UA Negative Negative       Assessment and Plan   # Depression S:  Patient feels she is more tired in general and then hard living alone with covid 19 and being constricted in ability to do things. She is compliant with lexapro 20mg . She has lost interest in some of her activities.   Caregivers report spells of crying in the evening primarily in the past but now also in the morning.  Patient really enjoys having company.  She really misses being able to get outside/to do more before COVID-19.  She feels agitated and irritable by restrictions  Staff attempted to collect PHQ-9 but with cognitive impairment was difficult. A/P: Poor control of depression is clear.  She did not report any symptoms of suicidal ideation.  She is currently on Lexapro 20 mg-we will continue this.  Patient with clearly diagnosed mild cognitive impairment but also strongly suspect dementia-she had declined prior work-up.  I think with anxiety we should restart buspirone.  I think the overall risk of serotonin syndrome is low-previously this is why family had taken her off combination -Also encouraged consideration of counseling-with memory changes not sure how beneficial this would be-but would be reasonable to try to see  #Worsening confusion #Concern for UTI S: Slightly worsening confusion over time-as above this may be related to dementia.  Family is also concerned about potential UTI.  Patient did not remember who I was today but to be honest I did have a significant haircut and was wearing a mask and face shield. A/P: No  obvious UTI.  Does have possible hematuria-we will get urine microscopic and urine culture for further evaluation.  I do not suspect UTI as the cause of worsening memory issues but instead worsening of likely underlying dementia.  She did have some mild suprapubic pain -If agitation is better at follow-up could consider updating MMSE  #COPD-patient with COPD noted on 2014 chest x-ray.  No evidence of pulmonary disease today causing pulmonary issues.  Stable without medication-continue to monitor.  Recommended follow up: Daughter will update me in 4 to 6 weeks Future Appointments  Date Time Provider [place]  05/23/2019  3:30 PM Gardiner Barefoot, DPM [facility]  09/18/2019  4:20 PM Yong Channel Brayton Mars, MD [facility]   Lab/Order associations:   ICD-10-CM   1. Major depressive disorder with single episode,  in partial remission (HCC) Chronic F32.4   2. Confusion  R41.0 Urine culture    POCT Urinalysis Dipstick (Automated)  3. Hematuria, unspecified type  R31.9 Urine culture    Urine Microscopic  4. Chronic obstructive pulmonary disease, unspecified COPD type (HCC) Chronic J44.9   5. Need for immunization against influenza  Z23 Flu Vaccine QUAD High Dose(Fluad)  6. Overactive bladder  N32.81     Meds ordered this encounter  Medications   busPIRone (BUSPAR) 5 MG tablet    Sig: Take 1 tablet (5 mg total) by mouth 2 (two) times daily as needed (agitation).    Dispense:  60 tablet    Refill:  2    Time Stamp The duration of face-to-face time during this visit was greater than 25 minutes. Greater than 50% of this time was spent in counseling, explanation of diagnosis, planning of further management, and/or coordination of care including counseling about the difficulties of COVID-19 for patient, counseling about depression and potential treatment options as well as agitation which is a primary concern of family.    Return precautions advised.  Garret Reddish, MD

## 2019-03-20 NOTE — Assessment & Plan Note (Signed)
Family decided to stop Myrbetriq due to concern of side effects

## 2019-03-20 NOTE — Patient Instructions (Addendum)
Health Maintenance Due  Topic Date Due  . INFLUENZA VACCINE Will get today  11/11/2018   Lets try buspirone twice a day to see if that helps you feel better. I am going to ask [name] to update me in 4-6 weeks with how you are doing or sooner if we don't feel like medicine is as helpful as we would like.   Could also consider calling [phone] to schedule a visit with  behavioral health -Trey [name] is an excellent counselor who is based out of our clinic  Recommended follow up: Return in about 6 months (around 09/18/2019) for follow up- or sooner if needed.

## 2019-03-21 LAB — URINALYSIS, MICROSCOPIC ONLY: RBC / HPF: NONE SEEN (ref 0–?)

## 2019-03-22 ENCOUNTER — Other Ambulatory Visit: Payer: Self-pay | Admitting: *Deleted

## 2019-03-22 LAB — URINE CULTURE
MICRO NUMBER:: 1176515
SPECIMEN QUALITY:: ADEQUATE

## 2019-03-22 MED ORDER — APIXABAN 5 MG PO TABS: ORAL_TABLET | ORAL | 5 refills | Status: DC

## 2019-03-22 NOTE — Addendum Note (Signed)
Addended by: Johny Shock B on: 03/22/2019 10:02 AM   Modules accepted: Orders

## 2019-03-22 NOTE — Telephone Encounter (Signed)
Prescription refill request for Eliquis received.  Last office visit: 10/29/2020Caryl Comes Scr: 0.83, 02/26/2019 Age: 85 y.o. Weight: 67 kg  Prescription refill sent.

## 2019-05-01 ENCOUNTER — Telehealth: Payer: Self-pay

## 2019-05-01 NOTE — Telephone Encounter (Signed)
Samples sent back to refill, never picked up

## 2019-05-09 ENCOUNTER — Other Ambulatory Visit: Payer: Self-pay | Admitting: Interventional Radiology

## 2019-05-09 ENCOUNTER — Telehealth: Payer: Self-pay

## 2019-05-09 DIAGNOSIS — N2889 Other specified disorders of kidney and ureter: Secondary | ICD-10-CM

## 2019-05-09 NOTE — Telephone Encounter (Signed)
05/09/19-Spoke w/ [name] (Daughter) stated that she would like to hold off on scheduling appt until after her mother has covid vaccine, mother's 2nd shot should be in [facility] March informed I will check back w/ her then to schedule f/u-BC

## 2019-05-20 ENCOUNTER — Ambulatory Visit: Payer: Medicare Other | Attending: Internal Medicine

## 2019-05-20 DIAGNOSIS — Z23 Encounter for immunization: Secondary | ICD-10-CM | POA: Insufficient documentation

## 2019-05-20 NOTE — Progress Notes (Signed)
   Covid-19 Vaccination Clinic  Name:  [name] [name]    MRN: [id number] DOB: 06/30/1931  05/20/2019  [name] [name] was observed post Covid-19 immunization for 15 minutes without incidence. She was provided with Vaccine Information Sheet and instruction to access the V-Safe system.   [name] [name] was instructed to call 911 with any severe reactions post vaccine: Marland Kitchen Difficulty breathing  . Swelling of your face and throat  . A fast heartbeat  . A bad rash all over your body  . Dizziness and weakness    Immunizations Administered    Name Date Dose VIS Date Route   Pfizer COVID-19 Vaccine 05/20/2019 11:44 AM 0.3 mL 03/23/2019 Intramuscular   Manufacturer: [place]   Lot: [id number]   [place]: [id number]

## 2019-05-23 ENCOUNTER — Encounter: Payer: Self-pay | Admitting: Podiatry

## 2019-05-23 ENCOUNTER — Ambulatory Visit (INDEPENDENT_AMBULATORY_CARE_PROVIDER_SITE_OTHER): Payer: Medicare Other | Admitting: Podiatry

## 2019-05-23 ENCOUNTER — Other Ambulatory Visit: Payer: Self-pay

## 2019-05-23 DIAGNOSIS — Q828 Other specified congenital malformations of skin: Secondary | ICD-10-CM

## 2019-05-23 DIAGNOSIS — D689 Coagulation defect, unspecified: Secondary | ICD-10-CM | POA: Diagnosis not present

## 2019-05-23 DIAGNOSIS — B351 Tinea unguium: Secondary | ICD-10-CM

## 2019-05-23 DIAGNOSIS — M79676 Pain in unspecified toe(s): Secondary | ICD-10-CM

## 2019-05-23 NOTE — Progress Notes (Signed)
Patient ID: [name] [name], female   DOB: 01-27-1932, 86 y.o.   MRN: [id number] Complaint:  Visit Type: Patient returns to my office for continued preventative foot care services. Complaint: Patient states" my nails have grown long and thick and become painful to walk and wear shoes" . The patient presents for preventative foot care services. No changes to ROS.    Podiatric Exam: Vascular: dorsalis pedis and posterior tibial pulses are palpable bilateral. Capillary return is immediate. Temperature gradient is WNL. Skin turgor WNL  Sensorium: Normal Semmes Weinstein monofilament test. Normal tactile sensation bilaterally. Nail Exam: Pt has thick disfigured discolored nails with subungual debris noted bilateral entire nail hallux through fifth toenails Ulcer Exam: There is no evidence of ulcer or pre-ulcerative changes or infection. Orthopedic Exam: Muscle tone and strength are WNL. No limitations in general ROM. No crepitus or effusions noted. Foot type and digits show no abnormalities.  Skin:  . No infection or ulcers.  Porokeratosis sub 3 right.  Diagnosis:  Onychomycosis, , Pain in right toe, pain in left toes  Porokeratosis sub 3 right  Treatment & Plan Procedures and Treatment: Consent by patient was obtained for treatment procedures. The patient understood the discussion of treatment and procedures well. All questions were answered thoroughly reviewed. Debridement of mycotic and hypertrophic toenails, 1 through 5 bilateral and clearing of subungual debris. No ulceration, no infection noted. Debride porokeratosis right.  Return Visit-Office Procedure: Patient instructed to return to the office for a follow up visit 3 months   for continued evaluation and treatment.    Gardiner Barefoot DPM

## 2019-06-02 ENCOUNTER — Other Ambulatory Visit: Payer: Self-pay | Admitting: Internal Medicine

## 2019-06-06 ENCOUNTER — Other Ambulatory Visit: Payer: Self-pay

## 2019-06-06 MED ORDER — ESCITALOPRAM OXALATE 20 MG PO TABS: ORAL_TABLET | ORAL | 1 refills | Status: DC

## 2019-06-07 ENCOUNTER — Ambulatory Visit: Payer: Medicare Other

## 2019-06-13 ENCOUNTER — Ambulatory Visit: Payer: Medicare Other

## 2019-06-14 ENCOUNTER — Ambulatory Visit: Payer: Medicare Other

## 2019-06-14 ENCOUNTER — Ambulatory Visit: Payer: Medicare Other | Attending: Internal Medicine

## 2019-06-14 DIAGNOSIS — Z23 Encounter for immunization: Secondary | ICD-10-CM | POA: Insufficient documentation

## 2019-06-14 NOTE — Progress Notes (Signed)
   Covid-19 Vaccination Clinic  Name:  [name] [name]    MRN: [id number] DOB: [birth year]  06/14/2019  [name] was observed post Covid-19 immunization for 15 minutes without incident. She was provided with Vaccine Information Sheet and instruction to access the V-Safe system.   [name] was instructed to call 911 with any severe reactions post vaccine: Marland Kitchen Difficulty breathing  . Swelling of face and throat  . A fast heartbeat  . A bad rash all over body  . Dizziness and weakness   Immunizations Administered    Name Date Dose VIS Date Route   Pfizer COVID-19 Vaccine 06/14/2019  3:01 PM 0.3 mL 03/23/2019 Intramuscular   Manufacturer: [place]   Lot: [id number]   [place]: [id number]

## 2019-06-16 ENCOUNTER — Telehealth: Payer: Self-pay | Admitting: Family Medicine

## 2019-06-16 NOTE — Telephone Encounter (Signed)
From email from son in law earlier this week "Shes on 5 mg BID prn listed. If she is not seeing benefit I agree with discontinuing. With those symptoms- id say lets do a rather rapid taper with how low a dose shes on- id be fine with 2.5 mg BID for a week then stop. If you prefer to do 25% per week im ok with that as well (5 AM, 2.5 pm; 2.5 AM, 2.5 PM; 2.5 AM, 0 PM over 3 weeks).   I will try to go back and think about next steps in next few days (I was off last thurs and Friday but did want to at least give initial response).   I probably would redirect on dirty clothes though it may not stick and not worth it if she gets too agitated.   Forwarding dates of her vaccine to my team.   Thanks, Garret Reddish "  I wrote a follow up email talking about therapy vs. Antipsychotic vs geri psych due to agitation reports from son in law below   "Hi Dr. Yong Channel:  Got the following text (didn't know how to get to you via phone, so I am going to enter the words cited by Jahlia's Orvan Seen, DOB Oct 28, 2031) sharpest/most astute care giver, Neoma Laming with [employer] that had been sent to [place].  "Shirlie is having a hard time this week.very disoriented and depressed. Crying and napping a lot. I am just following up as I know you were wondering about the latest (Buspar) medication helping." "I don't see that it is." "She is throwing her underwear and pj's in the trash* and forgetting where to turn the lights out, not wanting to shower.etc." "She says she is constipated..forgetting to flush toilet sometimes.just thought you should know." (She was provided Miralax for this and we are trying to assure that fruits and vegetables are a part of her daily intake Marlana Salvage has not 'stopped' eating--there are days where she may only eat 50-75% of her prepared dinner, largest meal of her day; does eat a hearty breakfast, has a sandwich for lunch]).  At last dispensing the [facility] Pharmacist mentioned the DDI you had cited upon  the original clinical decision to commence the Buspar (serotonin syndrome). She has been having drenching sweats, temperature fluctuations (she runs her thermostat at 90 degrees, citing she is cold). She still remains very anxious and has two or more periods of tearfulness lasting about 30-45 minutes, daily.   First question we guess:  Should we start de-escalating her Buspar?Marland Kitchen..taking BID (can't remember milligram strength/can't look in [facility]). If you agree, would a 25% reduction weekly ( or over the course of two weeks is one-intervals is too quick) be "slow" enough?  Second question:  Is there an alternative to help with her anxiety? She is maxed out on her escitalopram.   *Not sure if she just needs to be re-directed as to where her dirty clothes need to be placed.  **She HAS received the Pfizer COVID 19 vaccine (first dose on 05/20/19; second dose SCHEDULED for Wednesday, March 3 at [facility]).  Thank you! Canary Brim B. Ceasar Lund, PharmD, CACP, CPP "

## 2019-06-16 NOTE — Telephone Encounter (Signed)
  P.S. our practice or front office administrator could probably set yall up with proxy access for mychart for her so all our threads of conversation could be in her chart- It's a little harder to follow our prior converstations when some are by email some are by staff messages so maybe that could consolidate into one place. I have tried to go back and add info from this email to a phone note.

## 2019-06-16 NOTE — Telephone Encounter (Signed)
Follow up email to family   I was thinking some more about this situation and wanted to hear your thoughts about the following  1. It would be reasonable to consider counseling/therapy- I am not strongly confident in level of benefit but risk would be low 2. I considered atypical antipsychotics like olanzapine or Risperdal or Seroquel- most of these can cause orthostatic hypotension (a big concern with her BP issues in past) or QT issues (im pretty sure cardiology has been ok with some risk) . we have previously discussed Haldol which would have similar issues. If we wanted to try one of these we could- with potential benefits outweighing risks (which I know you also know includes higher mortalitiy with antipsychotics).  3. We could consider getting her back into neurology to see if they have any thoughts or try to get her in with geri psych (not a ton of options and may take time to get in).   This is really a tough situation and I wish I had a more solid recommendation.  Garret Reddish

## 2019-06-19 NOTE — Telephone Encounter (Signed)
I have spoken with Barbaraann Barthel.  I have advised on how to sign up for a proxy.

## 2019-06-29 ENCOUNTER — Telehealth: Payer: Self-pay | Admitting: Family Medicine

## 2019-06-29 ENCOUNTER — Other Ambulatory Visit: Payer: Self-pay | Admitting: Family Medicine

## 2019-06-29 NOTE — Telephone Encounter (Signed)
Called stating that patient is being verbally abusive to the caregiver.  States that patient is demanding the caregiver to leave for fear of training a new caregiver to take over this weekend.   States had stopped Buspar.   Would like to know if Dr. Yong Channel would suggest haldol or geodon for patient to get her calmed down enough for a care giver to take care of the patient while son in law and daughter are working and until they can get patient into the office.  Patient uses [facility] at [address].  I have explained that Dr. Yong Channel is out of the office but would send a note back.   Son in law stated he would follow up with Dr. Yong Channel as well in an email.

## 2019-07-02 MED ORDER — HALOPERIDOL 0.5 MG PO TABS: 0.5000 mg | ORAL_TABLET | Freq: Two times a day (BID) | ORAL | 1 refills | Status: DC | PRN

## 2019-07-02 NOTE — Telephone Encounter (Addendum)
I sent in a very low dose haldol 0.5mg  up to twice a day to trial.   Once again there is a risk of QT prolongation-from prior discussions with cardiology with her having the pacemaker in place this is less of a concern and also at this low dose I do not think is a major concern  Medication can cause lightheadedness with standing which she needs to be particularly careful of and needs to be monitored closely for 6 hours after a dose  Also there is increased mortality with antipsychotics-I do think the benefits outweigh the risks since without the medicine does not seem she is going to keep caregiver in the building-hoping this will calm agitation to allow caregiver to stay.    Just for documentation. I received this through staff message from son in law  "Hi Dr. Yong Channel, I called your office at 4:54pm and spoke with [place] who was sending you a message as well. Received a text from her caregiver at 4:49pm today that "[name] [name], I am in my car trembling as I am texting. Jakevia has demanded I leave her house, she is furious that I had to orient/train a new person to work this weekend. I really do not think I can handle her anymore. Very abusive verbally. I am calling [place] office now for backup."   Interestingly enough, Dr. Toy Care (sp?) a Psychiatric Hospitalist? Gave a noon day conference on Wednesday on "Delirium" and it's management. She cited the following:    Haldol--low anticholingeric properties, low risk of hypotension, low risk of respiratory suppression. Respiradone--potent, mild sedating properties, available in dissolvable tablets. Olanzapine--sedating properties, also available in dissolvable tablets. QTc prolongation is a risk. Seroquel--better option for Parkinson's disease/Lewy Body disease or SEDATION is primary goal. Geodon--For acute agitation, not too sedating, to be avoided in case of recent cardiac events. Saphris--sedating properties, available as a dissolvable tablet.    Not sure which you feel most comfortable with. Sedation/taking the edge off as she becomes increasingly agitated and anxious as she recognizes she has lost her memory. Any change (ex. New care-giver) of her routine is a triggering event.   Can you please give consideration to prescribing one of these medications to seek if we can get a handle on these outbursts and avoid a trip to [facility] or a caregiver refusing to come to the home any longer/permanently. [name] is off Monday and she will try to get her in if your office has any slots. If you can minimally prescribe something for 72 hours or until she can be evaluated by you in the office if necessary.  Any role here for a short-use of BZD/Xanax to get her under control??  Thank you! Canary Brim) Elie Confer, PharmD, CPP."

## 2019-07-02 NOTE — Telephone Encounter (Signed)
Called daughter. Let her know and reviewed all information. She is only available on Thursday afternoon for in office visit. She will look at her schedule and let me know when she can come in for visit.

## 2019-07-31 ENCOUNTER — Encounter (HOSPITAL_COMMUNITY): Payer: Self-pay | Admitting: Emergency Medicine

## 2019-07-31 ENCOUNTER — Other Ambulatory Visit: Payer: Self-pay

## 2019-07-31 ENCOUNTER — Emergency Department (HOSPITAL_COMMUNITY)
Admission: EM | Admit: 2019-07-31 | Discharge: 2019-07-31 | Disposition: A | Discharge status: Home / Self Care | Payer: Medicare Other | Attending: Emergency Medicine | Admitting: Emergency Medicine

## 2019-07-31 ENCOUNTER — Emergency Department (HOSPITAL_COMMUNITY): Payer: Medicare Other

## 2019-07-31 DIAGNOSIS — Z5321 Procedure and treatment not carried out due to patient leaving prior to being seen by health care provider: Secondary | ICD-10-CM | POA: Insufficient documentation

## 2019-07-31 DIAGNOSIS — M542 Cervicalgia: Secondary | ICD-10-CM | POA: Insufficient documentation

## 2019-07-31 LAB — CBC
HCT: 40.6 % (ref 36.0–46.0)
Hemoglobin: 13.1 g/dL (ref 12.0–15.0)
MCH: 29.7 pg (ref 26.0–34.0)
MCHC: 32.3 g/dL (ref 30.0–36.0)
MCV: 92.1 fL (ref 80.0–100.0)
Platelets: 137 10*3/uL — ABNORMAL LOW (ref 150–400)
RBC: 4.41 MIL/uL (ref 3.87–5.11)
RDW: 13.7 % (ref 11.5–15.5)
WBC: 8.1 10*3/uL (ref 4.0–10.5)
nRBC: 0 % (ref 0.0–0.2)

## 2019-07-31 LAB — BASIC METABOLIC PANEL WITH GFR
Anion gap: 13 (ref 5–15)
BUN: 11 mg/dL (ref 8–23)
CO2: 23 mmol/L (ref 22–32)
Calcium: 9 mg/dL (ref 8.9–10.3)
Chloride: 103 mmol/L (ref 98–111)
Creatinine, Ser: 1 mg/dL (ref 0.44–1.00)
GFR calc Af Amer: 59 mL/min — ABNORMAL LOW (ref >60)
GFR calc non Af Amer: 51 mL/min — ABNORMAL LOW (ref >60)
Glucose, Bld: 99 mg/dL (ref 70–99)
Potassium: 3.8 mmol/L (ref 3.5–5.1)
Sodium: 139 mmol/L (ref 135–145)

## 2019-07-31 LAB — TROPONIN I (HIGH SENSITIVITY): Troponin I (High Sensitivity): 12 ng/L (ref <18)

## 2019-07-31 NOTE — ED Notes (Signed)
Pt family member stated they are taking pt to her PCP

## 2019-07-31 NOTE — ED Triage Notes (Signed)
Pt arrives via EMS from home with reports of right sided neck pain and HA since Sunday. Denies CP or SOB

## 2019-08-06 ENCOUNTER — Other Ambulatory Visit: Payer: Self-pay

## 2019-08-06 MED ORDER — PYRIDOSTIGMINE BROMIDE 60 MG PO TABS: ORAL_TABLET | ORAL | 2 refills | Status: AC

## 2019-08-17 ENCOUNTER — Ambulatory Visit (INDEPENDENT_AMBULATORY_CARE_PROVIDER_SITE_OTHER): Payer: Medicare Other | Admitting: Family Medicine

## 2019-08-17 ENCOUNTER — Other Ambulatory Visit: Payer: Self-pay

## 2019-08-17 ENCOUNTER — Encounter: Payer: Self-pay | Admitting: Family Medicine

## 2019-08-17 VITALS — BP 124/62 | HR 73 | Temp 98.6°F | Ht 66.0 in | Wt 144.0 lb

## 2019-08-17 DIAGNOSIS — R319 Hematuria, unspecified: Secondary | ICD-10-CM | POA: Diagnosis not present

## 2019-08-17 DIAGNOSIS — R531 Weakness: Secondary | ICD-10-CM

## 2019-08-17 DIAGNOSIS — I442 Atrioventricular block, complete: Secondary | ICD-10-CM | POA: Diagnosis not present

## 2019-08-17 DIAGNOSIS — F324 Major depressive disorder, single episode, in partial remission: Secondary | ICD-10-CM

## 2019-08-17 DIAGNOSIS — R82998 Other abnormal findings in urine: Secondary | ICD-10-CM | POA: Diagnosis not present

## 2019-08-17 DIAGNOSIS — I5042 Chronic combined systolic (congestive) and diastolic (congestive) heart failure: Secondary | ICD-10-CM

## 2019-08-17 DIAGNOSIS — J449 Chronic obstructive pulmonary disease, unspecified: Secondary | ICD-10-CM

## 2019-08-17 LAB — POC URINALSYSI DIPSTICK (AUTOMATED)
Bilirubin, UA: POSITIVE
Blood, UA: POSITIVE
Glucose, UA: NEGATIVE
Ketones, UA: NEGATIVE
Leukocytes, UA: NEGATIVE
Nitrite, UA: NEGATIVE
Protein, UA: POSITIVE — AB
Spec Grav, UA: >=1.03 — AB (ref 1.010–1.025)
Urobilinogen, UA: 0.2 E.U./dL
pH, UA: 6 (ref 5.0–8.0)

## 2019-08-17 NOTE — Assessment & Plan Note (Signed)
Difficult to fully assess with level of memory loss.  We opted to continue Lexapro at this time.  She also has Haldol for agitation but has used very sparingly-does not seem be contributing to her recent issues as have not recently used.

## 2019-08-17 NOTE — Assessment & Plan Note (Signed)
No shortness of breath or wheezing reported.  Doubt this is contributing to current symptomatology of weakness.  Continue to monitor.  She does not use inhalers

## 2019-08-17 NOTE — Assessment & Plan Note (Signed)
Complete heart block with pacemaker in place.  In regards to weakness-Offered updating EKG but daughter declines-likely low yield anyway.  Continue cardiology follow-up

## 2019-08-17 NOTE — Assessment & Plan Note (Signed)
Carvedilol previously stopped by cardiology.  Patient continues on lisinopril.  With orthostatic issues/dizziness issues with typically stop this but with known systolic heart failure this will be continued.  She remains on pyridostigmine to try to allow [place] to continue the lisinopril and to try to help with orthostatic issues-she seems reasonably stable today on exam with walking without assist-could add cane or walker if needed

## 2019-08-17 NOTE — Patient Instructions (Addendum)
Increase fluid intake with how hot it is outside and with how you like to keep it warmer in the inside  I also think doing something like Gatorade would be good as well at least 1 or 2 times a day to get some electrolytes  We will let you know about the urine culture by Monday  If you have new or worsening symptoms letter after hours service know or seek care immediately  Recommended follow up: as needed for new or worsening symptoms or at least every 6 months

## 2019-08-17 NOTE — Progress Notes (Signed)
Phone [phone] In person visit   Subjective:   [name] [name] is a 86 y.o. year old very pleasant female patient who presents for/with See problem oriented charting Chief Complaint  Patient presents with  . Dysuria   This visit occurred during the SARS-CoV-2 public health emergency.  Safety protocols were in place, including screening questions prior to the visit, additional usage of staff PPE, and extensive cleaning of exam room while observing appropriate contact time as indicated for disinfecting solutions.   Past Medical History-  Patient Active Problem List   Diagnosis Date Noted  . Left renal mass 01/27/2017    Priority: High  . CAD (coronary artery disease) 04/17/2014    Priority: High  . Mild cognitive impairment 04/17/2014    Priority: High  . Pacemaker -CRT-St. Jude 05/21/2013    Priority: High  . Atrioventricular block, complete ([place]) 03/08/2013    Priority: High  . Paroxysmal atrial fibrillation (HCC) 02/03/2013    Priority: High  . Chronic combined systolic and diastolic congestive heart failure ([place]) 10/10/2012    Priority: High  . Ischemic cardiomyopathy  EF 30% cath 4/14 with stent 10/06/2007    Priority: High  . Major depressive disorder with single episode, in partial remission ([place]) 03/20/2019    Priority: Medium  . Overactive bladder 07/09/2016    Priority: Medium  . Dizziness 05/07/2016    Priority: Medium  . Microscopic hematuria 09/02/2015    Priority: Medium  . Asthma, intrinsic 02/03/2013    Priority: Medium  . Depression 09/19/2012    Priority: Medium  . History of melanoma 09/06/2008    Priority: Medium  . History of uterine cancer 09/06/2008    Priority: Medium  . Hyperlipidemia 10/06/2007    Priority: Medium  . Essential hypertension 10/06/2007    Priority: Medium  . Allergic rhinitis 04/17/2014    Priority: Low  . Hearing loss 04/17/2014    Priority: Low  . Varicose veins 09/29/2009    Priority: Low  . LBBB (left bundle  branch block) 09/06/2008    Priority: Low  . Chronic obstructive pulmonary disease ([place]) 03/20/2019  . Arthritis of carpometacarpal [facility]) joint of left thumb 06/02/2017  . Current use of long term anticoagulation 06/02/2017  . HCAP (healthcare-associated pneumonia) 04/04/2017  . Traumatic perinephric hematoma of left kidney 03/29/2017  . Acute blood loss as cause of postoperative anemia 03/29/2017    Medications- reviewed and updated Current Outpatient Medications  Medication Sig Dispense Refill  . apixaban (ELIQUIS) 5 MG TABS tablet TAKE 1 TABLET(5 MG) BY MOUTH TWICE DAILY 60 tablet 5  . escitalopram (LEXAPRO) 20 MG tablet TAKE 1 TABLET(20 MG) BY MOUTH DAILY 90 tablet 1  . fexofenadine (ALLEGRA) 180 MG tablet Take 180 mg by mouth daily.    . haloperidol (HALDOL) 0.5 MG tablet Take 1 tablet (0.5 mg total) by mouth 2 (two) times daily as needed for agitation (separate by at least 6 hours). 40 tablet 1  . lisinopril (ZESTRIL) 2.5 MG tablet TAKE 1 TABLET(2.5 MG) BY MOUTH DAILY 90 tablet 1  . pyridostigmine (MESTINON) 60 MG tablet TAKE 1 TABLET(60 MG) BY MOUTH DAILY 90 tablet 2   No current facility-administered medications for this visit.     Objective:  BP 124/62   Pulse 73   Temp 98.6 F (37 C) (Temporal)   Ht 5\' 6"  (1.676 m)   Wt 144 lb (65.3 kg)   LMP  (LMP Unknown)   SpO2 96%   BMI 23.24 kg/m  Gen: NAD,  resting comfortably CV: RRR no murmurs rubs or gallops Lungs: CTAB no crackles, wheeze, rhonchi Abdomen: soft/nontender/nondistended/normal bowel sounds.  Ext: no edema Skin: warm, dry Neuro: CN II-XII intact, sensation and reflexes normal throughout, 5/5 muscle strength in bilateral upper and lower extremities.  Normal rapid alternating movements. No pronator drift. Normal romberg. Normal gait for her - in my experience tends to have slight imbalance- does not seem significantly worse  Results for orders placed or performed in visit on 08/17/19 (from the past 24 hour(s))   POCT Urinalysis Dipstick (Automated)     Status: Abnormal   Collection Time: 08/17/19  2:56 PM  Result Value Ref Range   Color, UA yellow    Clarity, UA clear    Glucose, UA Negative Negative   Bilirubin, UA Positive    Ketones, UA Negative    Spec Grav, UA >=1.030 (A) 1.010 - 1.025   Blood, UA Positive    pH, UA 6.0 5.0 - 8.0   Protein, UA Positive (A) Negative   Urobilinogen, UA 0.2 0.2 or 1.0 E.U./dL   Nitrite, UA Negative    Leukocytes, UA Negative Negative       Assessment and Plan   # weakness/dark urine/concern UTI/? Of fall/dementia S: From initial conversation with patient and caregiver-patient is having some dizziness and pain on right side and neck. Weak and dark colored urine. Had fall yesterday due to weakness family question UTI.  Right neck pain reported but no midline pain-also some right flank pain but no midline pain.  These pains seem to be rather mild.  Caregiver today was told patient was weaker than normal.Patient thinks she "just came here" recently from "[place] to here". She seems significantly more confused that usual.   Our initial plan have been a stat head CT given the fact she is on Eliquis and concern for head trauma.  I called and spoke with patient's daughter [name] who is an Engineer, manufacturing provider with [place] pulmonary/critical [facility].  She was able to speak with caregiver from yesterday who stated patient did not actually have a fall but instead was lowered slowly to the ground related to weakness yesterday-strength has been better today. A/P: Urinalysis today with high specific gravity concerning for dehydration.  This could be a cause of patient's weakness.  I spoke with patient's daughter and she reported patient often does not do the job keeping up with fluid intake as whether warms up and she has also been keeping her house very warm and she notes her sweating at times even in the home. -As above initially we will plan for head CT given the  fact she did not have a fall or hit her head her daughter and I opted to not proceed forward with this testing -I asked the patient had use Haldol recently and apparently her mood has been better and she has not required this so this is not likely contributing factor to weakness -If patient has new or worsening symptoms or symptoms fail to improve should seek care. -From AVS "  Patient Instructions  Increase fluid intake with how hot it is outside and with how you like to keep it warmer in the inside  I also think doing something like Gatorade would be good as well at least 1 or 2 times a day to get some electrolytes  We will let you know about the urine culture by Monday  If you have new or worsening symptoms letter after hours service know or seek  care immediately  Recommended follow up: as needed for new or worsening symptoms or at least every 6 months  "   Atrioventricular block, complete Complete heart block with pacemaker in place.  In regards to weakness-Offered updating EKG but daughter declines-likely low yield anyway.  Continue cardiology follow-up  Chronic combined systolic and diastolic congestive heart failure ([place]) Carvedilol previously stopped by cardiology.  Patient continues on lisinopril.  With orthostatic issues/dizziness issues with typically stop this but with known systolic heart failure this will be continued.  She remains on pyridostigmine to try to allow [place] to continue the lisinopril and to try to help with orthostatic issues-she seems reasonably stable today on exam with walking without assist-could add cane or walker if needed  Major depressive disorder with single episode, in partial remission ([place]) Difficult to fully assess with level of memory loss.  We opted to continue Lexapro at this time.  She also has Haldol for agitation but has used very sparingly-does not seem be contributing to her recent issues as have not recently used.  Chronic obstructive pulmonary  disease (HCC) No shortness of breath or wheezing reported.  Doubt this is contributing to current symptomatology of weakness.  Continue to monitor.  She does not use inhalers   Recommended follow up: Recommended at least every 57-month follow-up- Future Appointments  Date Time Provider [place]  08/21/2019  3:45 PM Gardiner Barefoot, DPM [facility]  09/20/2019  1:40 PM Yong Channel Brayton Mars, MD [facility]   Lab/Order associations:   ICD-10-CM   1. Dark urine  R82.998 POCT Urinalysis Dipstick (Automated)  2. Hematuria, unspecified type  R31.9 Urine Culture    Urine Microscopic Only    Urine Microscopic Only  3. General weakness  R53.1   4. Atrioventricular block, complete (HCC)  I44.2   5. Chronic combined systolic and diastolic congestive heart failure (HCC)  I50.42   6. Major depressive disorder with single episode, in partial remission ([place])  F32.4   7. Chronic obstructive pulmonary disease, unspecified COPD type ([place])  J44.9    Return precautions advised.  Garret Reddish, MD

## 2019-08-18 LAB — URINALYSIS, MICROSCOPIC ONLY
Bacteria, UA: NONE SEEN /HPF
Hyaline Cast: NONE SEEN /LPF
RBC / HPF: NONE SEEN /HPF (ref 0–2)
WBC, UA: NONE SEEN /HPF (ref 0–5)

## 2019-08-18 LAB — URINE CULTURE
MICRO NUMBER:: 10452678
SPECIMEN QUALITY:: ADEQUATE

## 2019-08-21 ENCOUNTER — Ambulatory Visit (INDEPENDENT_AMBULATORY_CARE_PROVIDER_SITE_OTHER): Payer: Medicare Other | Admitting: Podiatry

## 2019-08-21 ENCOUNTER — Other Ambulatory Visit: Payer: Self-pay

## 2019-08-21 ENCOUNTER — Encounter: Payer: Self-pay | Admitting: Podiatry

## 2019-08-21 VITALS — Temp 97.2°F

## 2019-08-21 DIAGNOSIS — B351 Tinea unguium: Secondary | ICD-10-CM

## 2019-08-21 DIAGNOSIS — D689 Coagulation defect, unspecified: Secondary | ICD-10-CM

## 2019-08-21 DIAGNOSIS — Q828 Other specified congenital malformations of skin: Secondary | ICD-10-CM

## 2019-08-21 DIAGNOSIS — M79676 Pain in unspecified toe(s): Secondary | ICD-10-CM | POA: Diagnosis not present

## 2019-08-21 NOTE — Progress Notes (Signed)
This patient returns to my office for at risk foot care.  This patient requires this care by a professional since this patient will be at risk due to having coagulation defect.  Patient is taking eliquiss.  This patient is unable to cut nails herself since the patient cannot reach her nails.These nails are painful walking and wearing shoes.  This patient presents for at risk foot care today.  General Appearance  Alert, conversant and in no acute stress.  Vascular  Dorsalis pedis and posterior tibial  pulses are palpable  bilaterally.  Capillary return is within normal limits  bilaterally. Temperature is within normal limits  bilaterally.  Neurologic  Senn-Weinstein monofilament wire test within normal limits  bilaterally. Muscle power within normal limits bilaterally.  Nails Thick disfigured discolored nails with subungual debris  from hallux to fifth toes bilaterally. No evidence of bacterial infection or drainage bilaterally.  Orthopedic  No limitations of motion  feet .  No crepitus or effusions noted.  No bony pathology or digital deformities noted.  Skin  normotropic skin with no porokeratosis noted bilaterally.  No signs of infections or ulcers noted.     Onychomycosis  Pain in right toes  Pain in left toes  Consent was obtained for treatment procedures.   Mechanical debridement of nails 1-5  bilaterally performed with a nail nipper.  Filed with dremel without incident.    Return office visit     3 months                Told patient to return for periodic foot care and evaluation due to potential at risk complications.   Steve Gregg DPM  

## 2019-08-28 ENCOUNTER — Encounter: Payer: Self-pay | Admitting: Family Medicine

## 2019-08-29 ENCOUNTER — Encounter: Payer: Self-pay | Admitting: Family Medicine

## 2019-09-17 ENCOUNTER — Telehealth: Payer: Self-pay

## 2019-09-17 NOTE — Telephone Encounter (Signed)
Per [name], NP (Daughter) they are putting the pt in a memory care facility and do not wish to schedule f/u.

## 2019-09-18 ENCOUNTER — Ambulatory Visit: Payer: Medicare Other | Admitting: Family Medicine

## 2019-09-20 ENCOUNTER — Ambulatory Visit: Payer: Medicare Other | Admitting: Family Medicine

## 2019-09-20 DIAGNOSIS — Z0289 Encounter for other administrative examinations: Secondary | ICD-10-CM

## 2019-09-20 NOTE — Progress Notes (Deleted)
Phone [phone] In person visit   Subjective:   [name] [name] is a 86 y.o. year old very pleasant female patient who presents for/with See problem oriented charting No chief complaint on file.   This visit occurred during the SARS-CoV-2 public health emergency.  Safety protocols were in place, including screening questions prior to the visit, additional usage of staff PPE, and extensive cleaning of exam room while observing appropriate contact time as indicated for disinfecting solutions.   Past Medical History-  Patient Active Problem List   Diagnosis Date Noted  . Chronic obstructive pulmonary disease ([place]) 03/20/2019  . Major depressive disorder with single episode, in partial remission ([place]) 03/20/2019  . Arthritis of carpometacarpal [facility]) joint of left thumb 06/02/2017  . Current use of long term anticoagulation 06/02/2017  . HCAP (healthcare-associated pneumonia) 04/04/2017  . Traumatic perinephric hematoma of left kidney 03/29/2017  . Acute blood loss as cause of postoperative anemia 03/29/2017  . Left renal mass 01/27/2017  . Overactive bladder 07/09/2016  . Dizziness 05/07/2016  . Microscopic hematuria 09/02/2015  . CAD (coronary artery disease) 04/17/2014  . Allergic rhinitis 04/17/2014  . Dementia ([place]) 04/17/2014  . Hearing loss 04/17/2014  . Pacemaker -CRT-St. Jude 05/21/2013  . Atrioventricular block, complete ([place]) 03/08/2013  . Paroxysmal atrial fibrillation ([place]) 02/03/2013  . Asthma, intrinsic 02/03/2013  . Chronic combined systolic and diastolic congestive heart failure ([place]) 10/10/2012  . Depression 09/19/2012  . Varicose veins 09/29/2009  . History of melanoma 09/06/2008  . LBBB (left bundle branch block) 09/06/2008  . History of uterine cancer 09/06/2008  . Hyperlipidemia 10/06/2007  . Essential hypertension 10/06/2007  . Ischemic cardiomyopathy  EF 30% cath 4/14 with stent 10/06/2007    Medications- reviewed and updated Current Outpatient  Medications  Medication Sig Dispense Refill  . apixaban (ELIQUIS) 5 MG TABS tablet TAKE 1 TABLET(5 MG) BY MOUTH TWICE DAILY 60 tablet 5  . escitalopram (LEXAPRO) 20 MG tablet TAKE 1 TABLET(20 MG) BY MOUTH DAILY 90 tablet 1  . fexofenadine (ALLEGRA) 180 MG tablet Take 180 mg by mouth daily.    . haloperidol (HALDOL) 0.5 MG tablet Take 1 tablet (0.5 mg total) by mouth 2 (two) times daily as needed for agitation (separate by at least 6 hours). 40 tablet 1  . lisinopril (ZESTRIL) 2.5 MG tablet TAKE 1 TABLET(2.5 MG) BY MOUTH DAILY 90 tablet 1  . pyridostigmine (MESTINON) 60 MG tablet TAKE 1 TABLET(60 MG) BY MOUTH DAILY 90 tablet 2   No current facility-administered medications for this visit.     Objective:  LMP  (LMP Unknown)  Gen: NAD, resting comfortably CV: RRR no murmurs rubs or gallops Lungs: CTAB no crackles, wheeze, rhonchi Abdomen: soft/nontender/nondistended/normal bowel sounds. No rebound or guarding.  Ext: no edema Skin: warm, dry Neuro: grossly normal, moves all extremities  ***    Assessment and Plan  *** Jay and I have thought about low dose Haldol, but I am not tremendously comfortable with that. I am open to any suggestions you have.   Thank you in advance for your help with Mom. I appreciate you caring for her. I know she is going to need to be placed in a facility sometime soon , I am just hoping to keep her at home until she gets the COVID vaccine.   No problem-specific Assessment & Plan notes found for this encounter.   Recommended follow up: ***No follow-ups on file. Future Appointments  Date Time Provider [place]  09/20/2019  1:40 PM Yong Channel,  Brayton Mars, MD [facility]  11/20/2019  3:45 PM Gardiner Barefoot, DPM [facility]    Lab/Order associations:   ICD-10-CM   1. Chronic obstructive pulmonary disease, unspecified COPD type ([place])  J44.9   2. Major depressive disorder with single episode, in partial remission ([place])  F32.4   3.  Hyperlipidemia, unspecified hyperlipidemia type  E78.5   4. Essential hypertension  I10   5. Mild cognitive impairment  G31.84     No orders of the defined types were placed in this encounter.   Time Spent: *** minutes of total time (12:52 PM***- 12:52 PM***) was spent on the date of the encounter performing the following actions: chart review prior to seeing the patient, obtaining history, performing a medically necessary exam, counseling on the treatment plan, placing orders, and documenting in our EHR.   Return precautions advised.  Francella Solian, CMA

## 2019-09-25 ENCOUNTER — Other Ambulatory Visit: Payer: Self-pay | Admitting: Internal Medicine

## 2019-09-25 NOTE — Telephone Encounter (Signed)
Eliquis 5mg  refill request received. Patient is 86 years old, weight-65.3kg, Crea-1.00 on 07/31/2019, Diagnosis-Afib, and last seen by Dr. Caryl Comes via Telemedicine on 02/08/2019. Dose is appropriate based on dosing criteria. Will send in refill to requested pharmacy.

## 2019-10-04 ENCOUNTER — Other Ambulatory Visit: Payer: Self-pay | Admitting: Family Medicine

## 2019-10-10 ENCOUNTER — Encounter: Payer: Self-pay | Admitting: Family Medicine

## 2019-10-11 ENCOUNTER — Telehealth: Payer: Self-pay | Admitting: Family Medicine

## 2019-10-11 ENCOUNTER — Ambulatory Visit: Payer: Medicare Other | Admitting: Family Medicine

## 2019-10-11 ENCOUNTER — Encounter: Payer: Self-pay | Admitting: Family Medicine

## 2019-10-11 NOTE — Progress Notes (Deleted)
Phone [phone] In person visit   Subjective:   [name] [name] is a 86 y.o. year old very pleasant female patient who presents for/with See problem oriented charting No chief complaint on file.   This visit occurred during the SARS-CoV-2 public health emergency.  Safety protocols were in place, including screening questions prior to the visit, additional usage of staff PPE, and extensive cleaning of exam room while observing appropriate contact time as indicated for disinfecting solutions.   Past Medical History-  Patient Active Problem List   Diagnosis Date Noted  . Chronic obstructive pulmonary disease ([place]) 03/20/2019  . Major depressive disorder with single episode, in partial remission ([place]) 03/20/2019  . Arthritis of carpometacarpal [facility]) joint of left thumb 06/02/2017  . Current use of long term anticoagulation 06/02/2017  . HCAP (healthcare-associated pneumonia) 04/04/2017  . Traumatic perinephric hematoma of left kidney 03/29/2017  . Acute blood loss as cause of postoperative anemia 03/29/2017  . Left renal mass 01/27/2017  . Overactive bladder 07/09/2016  . Dizziness 05/07/2016  . Microscopic hematuria 09/02/2015  . CAD (coronary artery disease) 04/17/2014  . Allergic rhinitis 04/17/2014  . Dementia ([place]) 04/17/2014  . Hearing loss 04/17/2014  . Pacemaker -CRT-St. Jude 05/21/2013  . Atrioventricular block, complete ([place]) 03/08/2013  . Paroxysmal atrial fibrillation ([place]) 02/03/2013  . Asthma, intrinsic 02/03/2013  . Chronic combined systolic and diastolic congestive heart failure ([place]) 10/10/2012  . Depression 09/19/2012  . Varicose veins 09/29/2009  . History of melanoma 09/06/2008  . LBBB (left bundle branch block) 09/06/2008  . History of uterine cancer 09/06/2008  . Hyperlipidemia 10/06/2007  . Essential hypertension 10/06/2007  . Ischemic cardiomyopathy  EF 30% cath 4/14 with stent 10/06/2007    Medications- reviewed and updated Current Outpatient  Medications  Medication Sig Dispense Refill  . ELIQUIS 5 MG TABS tablet TAKE 1 TABLET(5 MG) BY MOUTH TWICE DAILY 60 tablet 5  . escitalopram (LEXAPRO) 20 MG tablet TAKE 1 TABLET(20 MG) BY MOUTH DAILY 90 tablet 1  . fexofenadine (ALLEGRA) 180 MG tablet Take 180 mg by mouth daily.    . haloperidol (HALDOL) 0.5 MG tablet Take 1 tablet (0.5 mg total) by mouth 2 (two) times daily as needed for agitation (separate by at least 6 hours). 40 tablet 1  . lisinopril (ZESTRIL) 2.5 MG tablet TAKE 1 TABLET(2.5 MG) BY MOUTH DAILY 90 tablet 1  . pyridostigmine (MESTINON) 60 MG tablet TAKE 1 TABLET(60 MG) BY MOUTH DAILY 90 tablet 2   No current facility-administered medications for this visit.     Objective:  LMP  (LMP Unknown)  Gen: NAD, resting comfortably CV: RRR no murmurs rubs or gallops Lungs: CTAB no crackles, wheeze, rhonchi Abdomen: soft/nontender/nondistended/normal bowel sounds. No rebound or guarding.  Ext: no edema Skin: warm, dry Neuro: grossly normal, moves all extremities  ***    Assessment and Plan  # VAGINAL DISCHARGE S:***  Onset: ***  Description: *** Odor: ***  Itching: ***  ROS/Symptoms: Dysuria: {YES/NO/WILD CARDS:18581}  Bleeding: {YES/NO/WILD CARDS:18581}  Pelvic pain: {YES/NO/WILD CARDS:18581}  Back pain: {YES/NO/WILD CARDS:18581}  Fever: {YES/NO/WILD CARDS:18581}  Genital sores: {YES/NO/WILD CARDS:18581}  Rash: {YES/NO/WILD CARDS:18581}  Dyspareunia: {YES/NO/WILD CARDS:18581}  GI Sxs: {YES/NO/WILD CARDS:18581}  Prior treatment: {YES/NO/WILD CARDS:18581}   Red Flags: Missed period: {YES/NO/WILD TOIZT:24580}  Pregnancy: {YES/NO/WILD CARDS:18581}  Recent antibiotics: {YES/NO/WILD CARDS:18581}  Sexual activity: {YES/NO/WILD CARDS:18581}  Possible STD exposure: {YES/NO/WILD CARDS:18581}  IUD: {YES/NO/WILD CARDS:18581}  Diabetes: {YES/NO/WILD CARDS:18581}  A/P: ***   Jay and I have thought about low dose  Haldol, but I am not tremendously comfortable with that.  I am open to any suggestions you have.   Thank you in advance for your help with Mom. I appreciate you caring for her. I know she is going to need to be placed in a facility sometime soon , I am just hoping to keep her at home until she gets the COVID vaccine.   No problem-specific Assessment & Plan notes found for this encounter.   Recommended follow up: ***No follow-ups on file. Future Appointments  Date Time Provider [place]  10/11/2019  9:40 AM Marin Olp, MD [facility]  11/20/2019  3:45 PM Gardiner Barefoot, DPM [facility]    Lab/Order associations: No diagnosis found.  No orders of the defined types were placed in this encounter.   Time Spent: *** minutes of total time (7:38 AM***- 7:38 AM***) was spent on the date of the encounter performing the following actions: chart review prior to seeing the patient, obtaining history, performing a medically necessary exam, counseling on the treatment plan, placing orders, and documenting in our EHR.   Return precautions advised.  Clyde Lundborg, CMA

## 2019-10-11 NOTE — Telephone Encounter (Signed)
Patient's son-in-law called in to cancel [name] [name]'s appointment today, because they were unaware that they had a pcp in the rest home that she is in, but would like to know if she has a gynecologist just in case this were to happen again.

## 2019-10-11 NOTE — Telephone Encounter (Signed)
Spoke to Mr. Elie Confer, told him I do not see a GYN provider on pt's chart. Mr. Elie Confer verbalized understanding and said they will find one and his sister told him pt has not seen one in a long time. Told him if you need a referral just let us know and we will gladly put order in. Mr. Elie Confer verbalized understanding and asked if there is anyone you recommend. Told him [facility] Boast. Mr. Elie Confer verbalized understanding.

## 2019-11-20 ENCOUNTER — Ambulatory Visit: Payer: Medicare Other | Admitting: Podiatry

## 2019-11-24 ENCOUNTER — Other Ambulatory Visit: Payer: Self-pay | Admitting: Family Medicine

## 2019-11-24 ENCOUNTER — Other Ambulatory Visit: Payer: Self-pay | Admitting: Internal Medicine

## 2020-09-01 ENCOUNTER — Emergency Department (HOSPITAL_COMMUNITY): Payer: Medicare Other

## 2020-09-01 ENCOUNTER — Encounter (HOSPITAL_COMMUNITY): Payer: Self-pay | Admitting: Student

## 2020-09-01 ENCOUNTER — Inpatient Hospital Stay (HOSPITAL_COMMUNITY)
Admission: EM | Admit: 2020-09-01 | Discharge: 2020-09-09 | DRG: 522 | Disposition: A | Discharge status: Skilled Nursing Facility | Payer: Medicare Other | Source: Skilled Nursing Facility | Attending: Family Medicine | Admitting: Family Medicine

## 2020-09-01 DIAGNOSIS — F0390 Unspecified dementia without behavioral disturbance: Secondary | ICD-10-CM | POA: Diagnosis present

## 2020-09-01 DIAGNOSIS — W19XXXA Unspecified fall, initial encounter: Secondary | ICD-10-CM | POA: Diagnosis present

## 2020-09-01 DIAGNOSIS — I480 Paroxysmal atrial fibrillation: Secondary | ICD-10-CM | POA: Diagnosis present

## 2020-09-01 DIAGNOSIS — I5042 Chronic combined systolic (congestive) and diastolic (congestive) heart failure: Secondary | ICD-10-CM | POA: Diagnosis present

## 2020-09-01 DIAGNOSIS — Z951 Presence of aortocoronary bypass graft: Secondary | ICD-10-CM | POA: Diagnosis not present

## 2020-09-01 DIAGNOSIS — S72001A Fracture of unspecified part of neck of right femur, initial encounter for closed fracture: Secondary | ICD-10-CM

## 2020-09-01 DIAGNOSIS — J439 Emphysema, unspecified: Secondary | ICD-10-CM | POA: Diagnosis present

## 2020-09-01 DIAGNOSIS — Z955 Presence of coronary angioplasty implant and graft: Secondary | ICD-10-CM | POA: Diagnosis not present

## 2020-09-01 DIAGNOSIS — Z66 Do not resuscitate: Secondary | ICD-10-CM | POA: Diagnosis present

## 2020-09-01 DIAGNOSIS — Z8249 Family history of ischemic heart disease and other diseases of the circulatory system: Secondary | ICD-10-CM

## 2020-09-01 DIAGNOSIS — I442 Atrioventricular block, complete: Secondary | ICD-10-CM | POA: Diagnosis present

## 2020-09-01 DIAGNOSIS — Z888 Allergy status to other drugs, medicaments and biological substances status: Secondary | ICD-10-CM

## 2020-09-01 DIAGNOSIS — S7291XA Unspecified fracture of right femur, initial encounter for closed fracture: Secondary | ICD-10-CM | POA: Diagnosis present

## 2020-09-01 DIAGNOSIS — Z01810 Encounter for preprocedural cardiovascular examination: Secondary | ICD-10-CM | POA: Diagnosis not present

## 2020-09-01 DIAGNOSIS — Z7901 Long term (current) use of anticoagulants: Secondary | ICD-10-CM | POA: Diagnosis not present

## 2020-09-01 DIAGNOSIS — S72009A Fracture of unspecified part of neck of unspecified femur, initial encounter for closed fracture: Secondary | ICD-10-CM | POA: Diagnosis present

## 2020-09-01 DIAGNOSIS — J449 Chronic obstructive pulmonary disease, unspecified: Secondary | ICD-10-CM | POA: Diagnosis present

## 2020-09-01 DIAGNOSIS — I252 Old myocardial infarction: Secondary | ICD-10-CM | POA: Diagnosis not present

## 2020-09-01 DIAGNOSIS — Z950 Presence of cardiac pacemaker: Secondary | ICD-10-CM

## 2020-09-01 DIAGNOSIS — S51011A Laceration without foreign body of right elbow, initial encounter: Secondary | ICD-10-CM | POA: Diagnosis present

## 2020-09-01 DIAGNOSIS — Z85820 Personal history of malignant melanoma of skin: Secondary | ICD-10-CM

## 2020-09-01 DIAGNOSIS — I5030 Unspecified diastolic (congestive) heart failure: Secondary | ICD-10-CM | POA: Diagnosis not present

## 2020-09-01 DIAGNOSIS — Z90710 Acquired absence of both cervix and uterus: Secondary | ICD-10-CM

## 2020-09-01 DIAGNOSIS — I10 Essential (primary) hypertension: Secondary | ICD-10-CM | POA: Diagnosis present

## 2020-09-01 DIAGNOSIS — Z8542 Personal history of malignant neoplasm of other parts of uterus: Secondary | ICD-10-CM | POA: Diagnosis not present

## 2020-09-01 DIAGNOSIS — Z79899 Other long term (current) drug therapy: Secondary | ICD-10-CM | POA: Diagnosis not present

## 2020-09-01 DIAGNOSIS — E785 Hyperlipidemia, unspecified: Secondary | ICD-10-CM | POA: Diagnosis present

## 2020-09-01 DIAGNOSIS — S72011A Unspecified intracapsular fracture of right femur, initial encounter for closed fracture: Principal | ICD-10-CM | POA: Diagnosis present

## 2020-09-01 DIAGNOSIS — I110 Hypertensive heart disease with heart failure: Secondary | ICD-10-CM | POA: Diagnosis present

## 2020-09-01 DIAGNOSIS — Z20822 Contact with and (suspected) exposure to covid-19: Secondary | ICD-10-CM | POA: Diagnosis present

## 2020-09-01 DIAGNOSIS — I428 Other cardiomyopathies: Secondary | ICD-10-CM | POA: Diagnosis present

## 2020-09-01 DIAGNOSIS — I2510 Atherosclerotic heart disease of native coronary artery without angina pectoris: Secondary | ICD-10-CM | POA: Diagnosis present

## 2020-09-01 DIAGNOSIS — S72041A Displaced fracture of base of neck of right femur, initial encounter for closed fracture: Secondary | ICD-10-CM

## 2020-09-01 DIAGNOSIS — Z8701 Personal history of pneumonia (recurrent): Secondary | ICD-10-CM | POA: Diagnosis not present

## 2020-09-01 DIAGNOSIS — H9190 Unspecified hearing loss, unspecified ear: Secondary | ICD-10-CM | POA: Diagnosis present

## 2020-09-01 DIAGNOSIS — F03918 Unspecified dementia, unspecified severity, with other behavioral disturbance: Secondary | ICD-10-CM | POA: Diagnosis present

## 2020-09-01 DIAGNOSIS — D696 Thrombocytopenia, unspecified: Secondary | ICD-10-CM | POA: Diagnosis present

## 2020-09-01 HISTORY — DX: Fracture of unspecified part of neck of right femur, initial encounter for closed fracture: S72.001A

## 2020-09-01 LAB — CBC WITH DIFFERENTIAL/PLATELET
Abs Immature Granulocytes: 0.04 10*3/uL (ref 0.00–0.07)
Basophils Absolute: 0 10*3/uL (ref 0.0–0.1)
Basophils Relative: 0 %
Eosinophils Absolute: 0.1 10*3/uL (ref 0.0–0.5)
Eosinophils Relative: 1 %
HCT: 39.5 % (ref 36.0–46.0)
Hemoglobin: 12.7 g/dL (ref 12.0–15.0)
Immature Granulocytes: 0 %
Lymphocytes Relative: 10 %
Lymphs Abs: 0.9 10*3/uL (ref 0.7–4.0)
MCH: 30.3 pg (ref 26.0–34.0)
MCHC: 32.2 g/dL (ref 30.0–36.0)
MCV: 94.3 fL (ref 80.0–100.0)
Monocytes Absolute: 0.5 10*3/uL (ref 0.1–1.0)
Monocytes Relative: 5 %
Neutro Abs: 7.6 10*3/uL (ref 1.7–7.7)
Neutrophils Relative %: 84 %
Platelets: 120 10*3/uL — ABNORMAL LOW (ref 150–400)
RBC: 4.19 MIL/uL (ref 3.87–5.11)
RDW: 14.6 % (ref 11.5–15.5)
WBC: 9.1 10*3/uL (ref 4.0–10.5)
nRBC: 0 % (ref 0.0–0.2)

## 2020-09-01 LAB — COMPREHENSIVE METABOLIC PANEL
ALT: 11 U/L (ref 0–44)
AST: 21 U/L (ref 15–41)
Albumin: 4 g/dL (ref 3.5–5.0)
Alkaline Phosphatase: 56 U/L (ref 38–126)
Anion gap: 8 (ref 5–15)
BUN: 19 mg/dL (ref 8–23)
CO2: 24 mmol/L (ref 22–32)
Calcium: 8.7 mg/dL — ABNORMAL LOW (ref 8.9–10.3)
Chloride: 109 mmol/L (ref 98–111)
Creatinine, Ser: 0.91 mg/dL (ref 0.44–1.00)
GFR, Estimated: >60 mL/min (ref >60)
Glucose, Bld: 124 mg/dL — ABNORMAL HIGH (ref 70–99)
Potassium: 4.4 mmol/L (ref 3.5–5.1)
Sodium: 141 mmol/L (ref 135–145)
Total Bilirubin: 0.8 mg/dL (ref 0.3–1.2)
Total Protein: 6.4 g/dL — ABNORMAL LOW (ref 6.5–8.1)

## 2020-09-01 LAB — TYPE AND SCREEN
ABO/RH(D): A POS
Antibody Screen: NEGATIVE

## 2020-09-01 LAB — PROTIME-INR
INR: 1.2 (ref 0.8–1.2)
Prothrombin Time: 15.6 seconds — ABNORMAL HIGH (ref 11.4–15.2)

## 2020-09-01 LAB — RESP PANEL BY RT-PCR (FLU A&B, COVID) ARPGX2
Influenza A by PCR: NEGATIVE
Influenza B by PCR: NEGATIVE
SARS Coronavirus 2 by RT PCR: NEGATIVE

## 2020-09-01 MED ORDER — ONDANSETRON HCL 4 MG/2ML IJ SOLN
4.0000 mg | Freq: Once | INTRAMUSCULAR | Status: AC
  Administered 2020-09-01: 4 mg via INTRAVENOUS
  Filled 2020-09-01: qty 2

## 2020-09-01 MED ORDER — FENTANYL CITRATE (PF) 100 MCG/2ML IJ SOLN
50.0000 ug | INTRAMUSCULAR | Status: AC | PRN
  Administered 2020-09-01 (×2): 50 ug via INTRAVENOUS
  Filled 2020-09-01 (×2): qty 2

## 2020-09-01 MED ORDER — FENTANYL CITRATE (PF) 100 MCG/2ML IJ SOLN
50.0000 ug | Freq: Once | INTRAMUSCULAR | Status: AC
  Administered 2020-09-01: 50 ug via INTRAVENOUS
  Filled 2020-09-01: qty 2

## 2020-09-01 NOTE — H&P (Signed)
History and Physical    [name] [name] DIY:[id number] DOB: Jan 12, 1932 DOA: 09/01/2020  PCP: Marin Olp, MD   Patient coming from: SNF  Chief Complaint: Right hip pain after unwitnessed fall  HPI: [name] [name] is a 87 y.o. female with medical history significant for paroxysmal atrial fibrillation, hypertension, CAD, COPD dementia who presents after an unwitnessed fall at facility.  Patient was found down on the ground on her right side with a small skin tear of her right elbow.  She complained of pain in her right hip and groin region.  Patient does not remember the fall and is not sure of what happened.  Patient cannot provide any history secondary to her dementia.  No family or staff from facility are present.  She is found to have a right femoral neck fracture on x-ray.  CT of her head was obtained emergency room and was negative.  She is anticoagulated on Eliquis chronically for A. Fib.   ED Course:  [name] [name] has been hemodynamically stable in the emergency room with elevated blood pressure readings.  She was given pain medication in the emergency room.  Has a right femoral neck fracture on x-ray.  Labs showed normal electrolytes and renal function.  CBC is unremarkable except for platelet count of 120,000.  INR is 1.2.  COVID and influenza swab are negative.  Hospitalist service been asked to admit for further management  Review of Systems:  Cannot obtain an accurate review of systems secondary to dementia  Past Medical History:  Diagnosis Date  . Atrial fibrillation ([place]) 10/06/2007   post op  . COPD (chronic obstructive pulmonary disease) ([place])    Emphysema on 02/03/13 CXR  . CORONARY ARTERY DISEASE 10/06/2007   a. s/p PCI to LAD; b. s/p CABG; c. LHC 07/13/12: Proximal LAD stent patent, LIMA-LAD atretic, D1 occluded, proximal circumflex 30%, mid RCA occluded, SVG-D1 normal, SVG-OM2 normal, SVG-distal RCA normal, EF 40% with diffuse HK  . Dizziness   . History of colonic polyps  10/30/2009   No polyps in 2011. No repeat.    Marland Kitchen HYPERLIPIDEMIA 10/06/2007  . HYPERTENSION 10/06/2007  . LBBB 09/06/2008  . Left renal mass   . MELANOMA 09/06/2008   MOES PROCEDURE RIGHT  . MYOCARDIAL INFARCTION, HX OF 10/06/2007  . NICM (nonischemic cardiomyopathy) ([place])    Echocardiogram 07/12/12: EF 25-30%, diffuse HK, mild AI, mild MR, mild LAE  . PERSONAL HX COLONIC POLYPS 10/30/2009  . Presence of permanent cardiac pacemaker   . Syncope   . UTERINE CANCER, HX OF 09/06/2008  . VARICOSE VEIN 09/29/2009    Past Surgical History:  Procedure Laterality Date  . ABDOMINAL HYSTERECTOMY  1988  . BI-VENTRICULAR PACEMAKER INSERTION (CRT-P)  02/02/2013   St. Jude, serial no. #[id number]   . CARDIAC CATHETERIZATION  08/30/2008  . CORONARY ANGIOPLASTY WITH STENT PLACEMENT  2009  . CORONARY ARTERY BYPASS GRAFT  2004  . IR RADIOLOGIST EVAL & MGMT  02/08/2017  . IR RADIOLOGIST EVAL & MGMT  05/26/2017  . IR RADIOLOGIST EVAL & MGMT  07/19/2017  . IR RADIOLOGIST EVAL & MGMT  05/04/2018  . LEFT HEART CATHETERIZATION WITH CORONARY ANGIOGRAM Bilateral 07/13/2012   Procedure: LEFT HEART CATHETERIZATION WITH CORONARY ANGIOGRAM;  Surgeon: Peter M Martinique, MD;  Location: [facility] CATH LAB;  Service: Cardiovascular;  Laterality: Bilateral;  . OOPHORECTOMY    . PERMANENT PACEMAKER INSERTION N/A 02/02/2013   Procedure: PERMANENT PACEMAKER INSERTION;  Surgeon: Evans Lance, MD;  Location: [facility] CATH LAB;  Service:  Cardiovascular;  Laterality: N/A;  . RADIOLOGY WITH ANESTHESIA Left 03/28/2017   Procedure: RENAL CRYO ABLATION;  Surgeon: Aletta Edouard, MD;  Location: [facility] ORS;  Service: Radiology;  Laterality: Left;  . right distal pretibeal     melanoma    Social History  reports that she has never smoked. She has never used smokeless tobacco. She reports current alcohol use. She reports that she does not use drugs.  Allergies  Allergen Reactions  . Aricept [Donepezil] Shortness Of Breath and Other (See Comments)     Hallucinations and loss of sensation in legs  . Nitroglycerin Other (See Comments)    Oral spray form causes rapid drop in blood pressure.   sensitive to tabs which causes rapid drop in blood pressure. Is ok to use oral spray form    Family History  Problem Relation Age of Onset  . Stroke Mother   . Heart attack Father   . Colon cancer Neg Hx      Prior to Admission medications   Medication Sig Start Date End Date Taking? Authorizing Provider  ELIQUIS 5 MG TABS tablet TAKE 1 TABLET(5 MG) BY MOUTH TWICE DAILY 09/25/19   Deboraha Sprang, MD  escitalopram (LEXAPRO) 20 MG tablet TAKE 1 TABLET(20 MG) BY MOUTH DAILY 11/26/19   Marin Olp, MD  fexofenadine (ALLEGRA) 180 MG tablet Take 180 mg by mouth daily.    [provider]  haloperidol (HALDOL) 0.5 MG tablet Take 1 tablet (0.5 mg total) by mouth 2 (two) times daily as needed for agitation (separate by at least 6 hours). 07/02/19   Marin Olp, MD  lisinopril (ZESTRIL) 2.5 MG tablet TAKE 1 TABLET(2.5 MG) BY MOUTH DAILY 11/26/19   Sueanne Margarita, MD  pyridostigmine (MESTINON) 60 MG tablet TAKE 1 TABLET(60 MG) BY MOUTH DAILY 08/06/19   Marin Olp, MD    Physical Exam: Vitals:   09/01/20 2115 09/01/20 2130 09/01/20 2210 09/01/20 2307  BP: (!) 149/110 (!) 155/97 (!) 159/139 (!) 170/75  Pulse: 70 70 70 70  Resp: (!) 21 (!) 30 (!) 25 20  Temp:      TempSrc:      SpO2: 90% 97% 96% 92%    Constitutional: NAD, calm, comfortable Vitals:   09/01/20 2115 09/01/20 2130 09/01/20 2210 09/01/20 2307  BP: (!) 149/110 (!) 155/97 (!) 159/139 (!) 170/75  Pulse: 70 70 70 70  Resp: (!) 21 (!) 30 (!) 25 20  Temp:      TempSrc:      SpO2: 90% 97% 96% 92%   General: WDWN, Alert and oriented to self.  Eyes: EOMI, PERRL, conjunctivae normal.  Sclera nonicteric HENT:  [place]/AT, external ears normal.  Nares patent without epistasis.  Mucous membranes are mois.  Neck: Soft, normal range of motion, supple, no masses, no thyromegaly.   Trachea midline Respiratory: clear to auscultation bilaterally, no wheezing, no crackles. Normal respiratory effort. No accessory muscle use.  Cardiovascular: Regular rate and rhythm, no murmurs / rubs / gallops. No extremity edema. 2+ pedal pulses.   Abdomen: Soft, no tenderness, nondistended, no rebound or guarding. No masses palpated. Bowel sounds normoactive Musculoskeletal: Right leg shortened and externally rotated. Right hip tender to palpation anteriorly and laterally. FROM of upper extremities. Left LE normal.  no cyanosis. Normal muscle tone.  Skin: Warm, dry, intact no rashes, lesions, ulcers. No induration Neurologic: Normal speech.  Sensation intact to touch in extremities. Strength 4/5 in upper extremities.   Psychiatric: Normal mood.  Labs on Admission: I have personally reviewed following labs and imaging studies  CBC: Recent Labs  Lab 09/01/20 1951  WBC 9.1  NEUTROABS 7.6  HGB 12.7  HCT 39.5  MCV 94.3  PLT 120*    Basic Metabolic Panel: Recent Labs  Lab 09/01/20 1951  NA 141  K 4.4  CL 109  CO2 24  GLUCOSE 124*  BUN 19  CREATININE 0.91  CALCIUM 8.7*    GFR: CrCl cannot be calculated (Unknown ideal weight.).  Liver Function Tests: Recent Labs  Lab 09/01/20 1951  AST 21  ALT 11  ALKPHOS 56  BILITOT 0.8  PROT 6.4*  ALBUMIN 4.0    Urine analysis:    Component Value Date/Time   COLORURINE YELLOW 12/09/2017 1626   APPEARANCEUR CLEAR 12/09/2017 1626   LABSPEC 1.026 12/09/2017 1626   PHURINE 5.5 12/09/2017 1626   GLUCOSEU NEGATIVE 12/09/2017 1626   GLUCOSEU NEGATIVE 12/29/2016 1558   HGBUR NEGATIVE 12/09/2017 1626   BILIRUBINUR Positive 08/17/2019 1456   KETONESUR NEGATIVE 12/09/2017 1626   PROTEINUR Positive (A) 08/17/2019 1456   PROTEINUR NEGATIVE 12/09/2017 1626   UROBILINOGEN 0.2 08/17/2019 1456   UROBILINOGEN 0.2 12/29/2016 1558   NITRITE Negative 08/17/2019 1456   NITRITE NEGATIVE 12/09/2017 1626   LEUKOCYTESUR Negative  08/17/2019 1456    Radiological Exams on Admission: DG Chest 1 View  Result Date: 09/01/2020 CLINICAL DATA:  Fall. EXAM: CHEST  1 VIEW COMPARISON:  07/31/2019 FINDINGS: Mild cardiomegaly. Mild aortic tortuosity. Post median sternotomy. Left-sided pacemaker in place. Mild vascular congestion without pulmonary edema. Subsegmental left lung base atelectasis. No confluent consolidation, pleural effusion or pneumothorax. No acute osseous abnormalities are seen. IMPRESSION: 1. Mild cardiomegaly with vascular congestion. 2. Subsegmental left lung base atelectasis. Electronically Signed   By: Keith Rake M.D.   On: 09/01/2020 20:11   CT Head Wo Contrast  Result Date: 09/01/2020 CLINICAL DATA:  Fall EXAM: CT HEAD WITHOUT CONTRAST CT CERVICAL SPINE WITHOUT CONTRAST TECHNIQUE: Multidetector CT imaging of the head and cervical spine was performed following the standard protocol without intravenous contrast. Multiplanar CT image reconstructions of the cervical spine were also generated. COMPARISON:  None. FINDINGS: CT HEAD FINDINGS Brain: No evidence of acute infarction, hemorrhage, hydrocephalus, extra-axial collection or mass lesion/mass effect. Periventricular and deep white matter hypodensity. Mild global cerebral volume loss. Vascular: No hyperdense vessel or unexpected calcification. Skull: Normal. Negative for fracture or focal lesion. Sinuses/Orbits: No acute finding. Other: None. CT CERVICAL SPINE FINDINGS Alignment: Degenerative straightening and reversal of the normal cervical lordosis. Skull base and vertebrae: No acute fracture. No primary bone lesion or focal pathologic process. Soft tissues and spinal canal: No prevertebral fluid or swelling. No visible canal hematoma. Disc levels: Moderate multilevel disc space height loss and osteophytosis. Upper chest: Negative. Other: None. IMPRESSION: 1. No acute intracranial pathology. Small-vessel white matter disease and mild global cerebral volume loss. 2.  No fracture or static subluxation of the cervical spine. 3. Moderate multilevel disc space height loss and osteophytosis. Electronically Signed   By: Eddie Candle M.D.   On: 09/01/2020 21:27   CT Cervical Spine Wo Contrast  Result Date: 09/01/2020 CLINICAL DATA:  Fall EXAM: CT HEAD WITHOUT CONTRAST CT CERVICAL SPINE WITHOUT CONTRAST TECHNIQUE: Multidetector CT imaging of the head and cervical spine was performed following the standard protocol without intravenous contrast. Multiplanar CT image reconstructions of the cervical spine were also generated. COMPARISON:  None. FINDINGS: CT HEAD FINDINGS Brain: No evidence of acute infarction, hemorrhage, hydrocephalus, extra-axial collection  or mass lesion/mass effect. Periventricular and deep white matter hypodensity. Mild global cerebral volume loss. Vascular: No hyperdense vessel or unexpected calcification. Skull: Normal. Negative for fracture or focal lesion. Sinuses/Orbits: No acute finding. Other: None. CT CERVICAL SPINE FINDINGS Alignment: Degenerative straightening and reversal of the normal cervical lordosis. Skull base and vertebrae: No acute fracture. No primary bone lesion or focal pathologic process. Soft tissues and spinal canal: No prevertebral fluid or swelling. No visible canal hematoma. Disc levels: Moderate multilevel disc space height loss and osteophytosis. Upper chest: Negative. Other: None. IMPRESSION: 1. No acute intracranial pathology. Small-vessel white matter disease and mild global cerebral volume loss. 2. No fracture or static subluxation of the cervical spine. 3. Moderate multilevel disc space height loss and osteophytosis. Electronically Signed   By: Eddie Candle M.D.   On: 09/01/2020 21:27   DG Hip Unilat  With Pelvis 2-3 Views Right  Result Date: 09/01/2020 CLINICAL DATA:  Pain after fall. EXAM: DG HIP (WITH OR WITHOUT PELVIS) 2-3V RIGHT COMPARISON:  None. FINDINGS: Displaced right femoral neck fracture with proximal migration of  the femoral shaft. The femoral head remains seated. Pubic rami are intact. Pubic symphysis and sacroiliac joints are congruent. Surgical clips in the pelvis. IMPRESSION: Displaced right femoral neck fracture. Electronically Signed   By: Keith Rake M.D.   On: 09/01/2020 20:10    EKG: Independently reviewed.  EKG is ventricular paced with no acute ST elevation or depression.  Prolonged QTC of 521  Assessment/Plan Principal Problem:   Fracture of femoral neck, right  [name] [name] is admitted to telemetry floor.  Orthopedics in consulted and will see pt in am.  Eliquis is stopped and needs to be off for at least 48 hours before can proceed to surgery.  Pain control with dilaudid ordered  Will need PT evaluation for rehab after repair of fracture.   Active Problems:   Essential hypertension Continue home medication of lisinopril. Monitor BP    Paroxysmal atrial fibrillation  Chronic. Has pacemaker.     Chronic obstructive pulmonary disease Duonebs as needed for SOB, wheeze, cough    Pacemaker -CRT-St. Jude Chronic    Dementia  Chronic    Current use of long term anticoagulation Due to hx of paroxysmal a-fib. Eliquis held now for repair of hip fracture.      DVT prophylaxis: Is on Eliquis which is held for impending surgery.  Code Status:   Full code  Family Communication:  No family at bedside.  Disposition Plan:   Patient is from:  SNF  Anticipated DC to:  SNF  Anticipated DC date:  Anticipate 2 midnight or more stay to treat acute condition   Consults called:  Orthopedic surgery consulted by ER physician will see patient in the morning Admission status:  Inpatient  Yevonne Aline Floy Riegler MD [facility]  How to contact the [facility] Attending or Consulting provider [place] or covering provider during after hours [place], for this patient?   1. Check the care team in [facility] and look for a) attending/consulting [facility] provider listed and b) the [facility] team listed 2. Log into  [email or web address] and use 's universal password to access. If you do not have the password, please contact the hospital operator. 3. Locate the [facility] provider you are looking for under [facility] and page to a number that you can be directly reached. 4. If you still have difficulty reaching the provider, please page the [facility] (Director on Call) for the Hospitalists listed  on amion for assistance.  09/01/2020, 11:23 PM

## 2020-09-01 NOTE — ED Provider Notes (Signed)
[place] DEPT Provider Note   CSN: [id number] Arrival date & time: 09/01/20  1846     History Chief Complaint  Patient presents with  . Fall  . Leg Pain  . Leg Injury    [name] [name] is a 87 y.o. female.  HPI      87yo female with history of atrial fibrillation on eliquis, CAD, COPD, hypertension, hyperlipidemia, presents with concern for unwitnessed fall and right hip pain.   Ambulatory, severe dementia, only albe to remember last 43sec per daughter Unwitnessed fall, found down on right side, skin tear right elbow and hip and groin pain Put her in a wheelchair and they could not get her to stand and sent to ED History from patient is limited due to dementia, able to state she has right hip/groin pain. Denies other areas of pain or concerns. Does not remember the fall.   Daughter [name] [name] is NP and works for [employer]  Past Medical History:  Diagnosis Date  . Atrial fibrillation ([place]) 10/06/2007   post op  . COPD (chronic obstructive pulmonary disease) ([place])    Emphysema on 02/03/13 CXR  . CORONARY ARTERY DISEASE 10/06/2007   a. s/p PCI to LAD; b. s/p CABG; c. LHC 07/13/12: Proximal LAD stent patent, LIMA-LAD atretic, D1 occluded, proximal circumflex 30%, mid RCA occluded, SVG-D1 normal, SVG-OM2 normal, SVG-distal RCA normal, EF 40% with diffuse HK  . Dizziness   . History of colonic polyps 10/30/2009   No polyps in 2011. No repeat.    Marland Kitchen HYPERLIPIDEMIA 10/06/2007  . HYPERTENSION 10/06/2007  . LBBB 09/06/2008  . Left renal mass   . MELANOMA 09/06/2008   MOES PROCEDURE RIGHT  . MYOCARDIAL INFARCTION, HX OF 10/06/2007  . NICM (nonischemic cardiomyopathy) ([place])    Echocardiogram 07/12/12: EF 25-30%, diffuse HK, mild AI, mild MR, mild LAE  . PERSONAL HX COLONIC POLYPS 10/30/2009  . Presence of permanent cardiac pacemaker   . Syncope   . UTERINE CANCER, HX OF 09/06/2008  . VARICOSE VEIN 09/29/2009    Patient Active Problem List   Diagnosis Date  Noted  . Closed fracture of right femur, unspecified fracture morphology, initial encounter ([place]) 09/02/2020  . Fracture of femoral neck, right ([place]) 09/01/2020  . Closed hip fracture, right, initial encounter ([place]) 09/01/2020  . Chronic obstructive pulmonary disease ([place]) 03/20/2019  . Major depressive disorder with single episode, in partial remission ([place]) 03/20/2019  . Arthritis of carpometacarpal [facility]) joint of left thumb 06/02/2017  . Current use of long term anticoagulation 06/02/2017  . HCAP (healthcare-associated pneumonia) 04/04/2017  . Traumatic perinephric hematoma of left kidney 03/29/2017  . Acute blood loss as cause of postoperative anemia 03/29/2017  . Left renal mass 01/27/2017  . Overactive bladder 07/09/2016  . Dizziness 05/07/2016  . Microscopic hematuria 09/02/2015  . CAD (coronary artery disease) 04/17/2014  . Allergic rhinitis 04/17/2014  . Dementia ([place]) 04/17/2014  . Hearing loss 04/17/2014  . Pacemaker -CRT-St. Jude 05/21/2013  . Atrioventricular block, complete ([place]) 03/08/2013  . Paroxysmal atrial fibrillation ([place]) 02/03/2013  . Asthma, intrinsic 02/03/2013  . Chronic combined systolic and diastolic congestive heart failure ([place]) 10/10/2012  . Depression 09/19/2012  . Varicose veins 09/29/2009  . History of melanoma 09/06/2008  . LBBB (left bundle branch block) 09/06/2008  . History of uterine cancer 09/06/2008  . Hyperlipidemia 10/06/2007  . Essential hypertension 10/06/2007  . Ischemic cardiomyopathy  EF 30% cath 4/14 with stent 10/06/2007    Past Surgical History:  Procedure  Laterality Date  . ABDOMINAL HYSTERECTOMY  1988  . BI-VENTRICULAR PACEMAKER INSERTION (CRT-P)  02/02/2013   St. Jude, serial no. #[id number]   . CARDIAC CATHETERIZATION  08/30/2008  . CORONARY ANGIOPLASTY WITH STENT PLACEMENT  2009  . CORONARY ARTERY BYPASS GRAFT  2004  . IR RADIOLOGIST EVAL & MGMT  02/08/2017  . IR RADIOLOGIST EVAL & MGMT  05/26/2017  . IR RADIOLOGIST EVAL  & MGMT  07/19/2017  . IR RADIOLOGIST EVAL & MGMT  05/04/2018  . LEFT HEART CATHETERIZATION WITH CORONARY ANGIOGRAM Bilateral 07/13/2012   Procedure: LEFT HEART CATHETERIZATION WITH CORONARY ANGIOGRAM;  Surgeon: Peter M Martinique, MD;  Location[facility] C[facility] LAB;  Service: Cardiovascular;  Laterality: Bilateral;  . OOPHORECTOMY    . PERMANENT PACEMAKER INSERTION N/A 02/02/2013   Procedure: PERMANENT PACEMAKER INSERTION;  Surgeon: Evans Lance, MD;  Location: [facility] CATH LAB;  Service: Cardiovascular;  Laterality: N/A;  . RADIOLOGY WITH ANESTHESIA Left 03/28/2017   Procedure: RENAL CRYO ABLATION;  Surgeon: Aletta Edouard, MD;  Location: [facility] ORS;  Service: Radiology;  Laterality: Left;  . right distal pretibeal     melanoma     OB History   No obstetric history on file.     Family History  Problem Relation Age of Onset  . Stroke Mother   . Heart attack Father   . Colon cancer Neg Hx     Social History   Tobacco Use  . Smoking status: Never Smoker  . Smokeless tobacco: Never Used  Vaping Use  . Vaping Use: Never used  Substance Use Topics  . Alcohol use: Yes    Comment:  OCC gin or wine  . Drug use: No    Home Medications Prior to Admission medications   Medication Sig Start Date End Date Taking? Authorizing Provider  acetaminophen (TYLENOL) 500 MG tablet Take 1,000 mg by mouth every 8 (eight) hours as needed for fever, moderate pain or mild pain.   Yes [provider]  ELIQUIS 5 MG TABS tablet TAKE 1 TABLET(5 MG) BY MOUTH TWICE DAILY 09/25/19  Yes Deboraha Sprang, MD  escitalopram (LEXAPRO) 20 MG tablet TAKE 1 TABLET(20 MG) BY MOUTH DAILY 11/26/19  Yes Marin Olp, MD  haloperidol (HALDOL) 0.5 MG tablet Take 1 tablet (0.5 mg total) by mouth 2 (two) times daily as needed for agitation (separate by at least 6 hours). 07/02/19  Yes Marin Olp, MD  lisinopril (ZESTRIL) 2.5 MG tablet TAKE 1 TABLET(2.5 MG) BY MOUTH DAILY 11/26/19  Yes Turner, Eber Hong, MD  Melatonin 10 MG CAPS  Take 10 mg by mouth at bedtime.   Yes [provider]  memantine (NAMENDA) 5 MG tablet Take 5 mg by mouth 2 (two) times daily. 08/22/20  Yes [provider]  pyridostigmine (MESTINON) 60 MG tablet TAKE 1 TABLET(60 MG) BY MOUTH DAILY 08/06/19  Yes Marin Olp, MD  traMADol (ULTRAM) 50 MG tablet Take 50 mg by mouth 2 (two) times daily as needed. 06/26/20  Yes [provider]    Allergies    Aricept [donepezil] and Nitroglycerin  Review of Systems   Review of Systems  Unable to perform ROS: Dementia  Musculoskeletal: Positive for arthralgias and gait problem.    Physical Exam Updated Vital Signs BP (!) 172/81 (BP Location: Right Arm)   Pulse 70   Temp (!) 97.3 F (36.3 C) (Oral)   Resp 17   LMP  (LMP Unknown)   SpO2 90%   Physical Exam Vitals and  nursing note reviewed.  Constitutional:      General: She is not in acute distress.    Appearance: She is well-developed. She is not diaphoretic.  HENT:     Head: Normocephalic and atraumatic.  Eyes:     Conjunctiva/sclera: Conjunctivae normal.  Cardiovascular:     Rate and Rhythm: Normal rate and regular rhythm.     Heart sounds: Normal heart sounds. No murmur heard. No friction rub. No gallop.   Pulmonary:     Effort: Pulmonary effort is normal. No respiratory distress.     Breath sounds: Normal breath sounds. No wheezing or rales.  Abdominal:     General: There is no distension.     Palpations: Abdomen is soft.     Tenderness: There is no abdominal tenderness. There is no guarding.  Musculoskeletal:        General: Tenderness and deformity (shortened ext rotated RLE) present.     Cervical back: Normal range of motion.  Skin:    General: Skin is warm and dry.     Findings: No erythema or rash.  Neurological:     Mental Status: She is alert.     Comments: Oriented to self and hospital Follows commands Repetitive questioning      ED Results / Procedures / Treatments   Labs (all labs  ordered are listed, but only abnormal results are displayed) Labs Reviewed  CBC WITH DIFFERENTIAL/PLATELET - Abnormal; Notable for the following components:      Result Value   Platelets 120 (*)    All other components within normal limits  PROTIME-INR - Abnormal; Notable for the following components:   Prothrombin Time 15.6 (*)    All other components within normal limits  COMPREHENSIVE METABOLIC PANEL - Abnormal; Notable for the following components:   Glucose, Bld 124 (*)    Calcium 8.7 (*)    Total Protein 6.4 (*)    All other components within normal limits  CBC - Abnormal; Notable for the following components:   Platelets 111 (*)    All other components within normal limits  BASIC METABOLIC PANEL - Abnormal; Notable for the following components:   Glucose, Bld 164 (*)    Calcium 8.6 (*)    All other components within normal limits  CBG MONITORING, ED - Abnormal; Notable for the following components:   Glucose-Capillary 132 (*)    All other components within normal limits  RESP PANEL BY RT-PCR (FLU A&B, COVID) [id number]  HEMOGLOBIN A1C  TYPE AND SCREEN    EKG EKG Interpretation  Date/Time:  Monday Sep 01 2020 20:56:13 EDT Ventricular Rate:  70 PR Interval:  76 QRS Duration: 143 QT Interval:  482 QTC Calculation: 521 R Axis:   -82 Text Interpretation: Ventricular-paced complexes No further analysis attempted due to paced rhythm When compared with ECG of 07/31/2019, No significant change was found Confirmed by Delora Fuel ([id number]) on 09/02/2020 6:56:48 AM   Radiology DG Chest 1 View  Result Date: 09/01/2020 CLINICAL DATA:  Fall. EXAM: CHEST  1 VIEW COMPARISON:  07/31/2019 FINDINGS: Mild cardiomegaly. Mild aortic tortuosity. Post median sternotomy. Left-sided pacemaker in place. Mild vascular congestion without pulmonary edema. Subsegmental left lung base atelectasis. No confluent consolidation, pleural effusion or pneumothorax. No acute osseous abnormalities are seen.  IMPRESSION: 1. Mild cardiomegaly with vascular congestion. 2. Subsegmental left lung base atelectasis. Electronically Signed   By: Keith Rake M.D.   On: 09/01/2020 20:11   CT Head Wo Contrast  Result Date: 09/01/2020 CLINICAL  DATA:  Fall EXAM: CT HEAD WITHOUT CONTRAST CT CERVICAL SPINE WITHOUT CONTRAST TECHNIQUE: Multidetector CT imaging of the head and cervical spine was performed following the standard protocol without intravenous contrast. Multiplanar CT image reconstructions of the cervical spine were also generated. COMPARISON:  None. FINDINGS: CT HEAD FINDINGS Brain: No evidence of acute infarction, hemorrhage, hydrocephalus, extra-axial collection or mass lesion/mass effect. Periventricular and deep white matter hypodensity. Mild global cerebral volume loss. Vascular: No hyperdense vessel or unexpected calcification. Skull: Normal. Negative for fracture or focal lesion. Sinuses/Orbits: No acute finding. Other: None. CT CERVICAL SPINE FINDINGS Alignment: Degenerative straightening and reversal of the normal cervical lordosis. Skull base and vertebrae: No acute fracture. No primary bone lesion or focal pathologic process. Soft tissues and spinal canal: No prevertebral fluid or swelling. No visible canal hematoma. Disc levels: Moderate multilevel disc space height loss and osteophytosis. Upper chest: Negative. Other: None. IMPRESSION: 1. No acute intracranial pathology. Small-vessel white matter disease and mild global cerebral volume loss. 2. No fracture or static subluxation of the cervical spine. 3. Moderate multilevel disc space height loss and osteophytosis. Electronically Signed   By: Eddie Candle M.D.   On: 09/01/2020 21:27   CT Cervical Spine Wo Contrast  Result Date: 09/01/2020 CLINICAL DATA:  Fall EXAM: CT HEAD WITHOUT CONTRAST CT CERVICAL SPINE WITHOUT CONTRAST TECHNIQUE: Multidetector CT imaging of the head and cervical spine was performed following the standard protocol without  intravenous contrast. Multiplanar CT image reconstructions of the cervical spine were also generated. COMPARISON:  None. FINDINGS: CT HEAD FINDINGS Brain: No evidence of acute infarction, hemorrhage, hydrocephalus, extra-axial collection or mass lesion/mass effect. Periventricular and deep white matter hypodensity. Mild global cerebral volume loss. Vascular: No hyperdense vessel or unexpected calcification. Skull: Normal. Negative for fracture or focal lesion. Sinuses/Orbits: No acute finding. Other: None. CT CERVICAL SPINE FINDINGS Alignment: Degenerative straightening and reversal of the normal cervical lordosis. Skull base and vertebrae: No acute fracture. No primary bone lesion or focal pathologic process. Soft tissues and spinal canal: No prevertebral fluid or swelling. No visible canal hematoma. Disc levels: Moderate multilevel disc space height loss and osteophytosis. Upper chest: Negative. Other: None. IMPRESSION: 1. No acute intracranial pathology. Small-vessel white matter disease and mild global cerebral volume loss. 2. No fracture or static subluxation of the cervical spine. 3. Moderate multilevel disc space height loss and osteophytosis. Electronically Signed   By: Eddie Candle M.D.   On: 09/01/2020 21:27   DG Knee Complete 4 Views Right  Result Date: 09/02/2020 CLINICAL DATA:  History of recent falls, initial encounter EXAM: RIGHT KNEE - COMPLETE 4+ VIEW COMPARISON:  07/17/2012 FINDINGS: Meniscal calcifications are noted. Mild medial joint space narrowing is noted. No joint effusion is seen. No acute fracture or dislocation is noted. Mild patellofemoral degenerative changes are noted as well. IMPRESSION: Degenerative change without acute abnormality. Electronically Signed   By: Inez Catalina M.D.   On: 09/02/2020 09:17   DG Hip Unilat  With Pelvis 2-3 Views Right  Result Date: 09/01/2020 CLINICAL DATA:  Pain after fall. EXAM: DG HIP (WITH OR WITHOUT PELVIS) 2-3V RIGHT COMPARISON:  None.  FINDINGS: Displaced right femoral neck fracture with proximal migration of the femoral shaft. The femoral head remains seated. Pubic rami are intact. Pubic symphysis and sacroiliac joints are congruent. Surgical clips in the pelvis. IMPRESSION: Displaced right femoral neck fracture. Electronically Signed   By: Keith Rake M.D.   On: 09/01/2020 20:10    Procedures Procedures   Medications Ordered in  ED Medications  lisinopril (ZESTRIL) tablet 2.5 mg (has no administration in time range)  escitalopram (LEXAPRO) tablet 20 mg (has no administration in time range)  HYDROcodone-acetaminophen (NORCO/VICODIN) 5-325 MG per tablet 1-2 tablet (has no administration in time range)  lactated ringers infusion ( Intravenous New Bag/Given 09/02/20 0408)  HYDROmorphone (DILAUDID) injection 0.5 mg (0.5 mg Intravenous Given 09/02/20 0410)  senna-docusate (Senokot-S) tablet 1 tablet (has no administration in time range)  insulin aspart (novoLOG) injection 0-9 Units (has no administration in time range)  insulin aspart (novoLOG) injection 0-5 Units (has no administration in time range)  fentaNYL (SUBLIMAZE) injection 50 mcg (50 mcg Intravenous Given 09/01/20 2105)  ondansetron (ZOFRAN) injection 4 mg (4 mg Intravenous Given 09/01/20 2001)  fentaNYL (SUBLIMAZE) injection 50 mcg (50 mcg Intravenous Given 09/01/20 2259)    ED Course  I have reviewed the triage vital signs and the nursing notes.  Pertinent labs & imaging results that were available during my care of the patient were reviewed by me and considered in my medical decision making (see chart for details).    MDM Rules/Calculators/A&P                          87yo female with history of atrial fibrillation on eliquis, CAD, COPD, hypertension, hyperlipidemia, presents with concern for unwitnessed fall and right hip pain.   Given unwitnessed on anticoagulation, limited hx, CT head and CSpine ordered showing no acute abnormalities. Does not have back  pain or other significant areas of tenderness on exam.   XR shows right femoral neck fracture. It is closed and she is NV intact. Spoke with Dr. [facility], and Ortho to evaluate in AM, will not be able to pursue surgery tomorrow due to eliquis. Admitted for further care.    Final Clinical Impression(s) / ED Diagnoses Final diagnoses:  Hip fracture [facility])    Rx / DC Orders ED Discharge Orders    None       Gareth Morgan, MD 09/02/20 [phone]

## 2020-09-01 NOTE — ED Triage Notes (Addendum)
Patient BIB [facility] from [facility] after a fall. Patient was walking down the fall and lost her footing and fell.  Patient is complaining of right hip pain and it has possible rotation.  EMS placed 20g left FA and received 200 mcg IV in total of fentanyl which has not helped with pain and 4mg  IV zofran.   Patient has a pacemaker and a hx of dementia.   Vitals were 194/106 68-HR 20-RR 98% room air

## 2020-09-02 ENCOUNTER — Inpatient Hospital Stay (HOSPITAL_COMMUNITY): Payer: Medicare Other

## 2020-09-02 ENCOUNTER — Other Ambulatory Visit: Payer: Self-pay

## 2020-09-02 ENCOUNTER — Encounter (HOSPITAL_COMMUNITY): Payer: Self-pay | Admitting: Family Medicine

## 2020-09-02 DIAGNOSIS — I5030 Unspecified diastolic (congestive) heart failure: Secondary | ICD-10-CM

## 2020-09-02 DIAGNOSIS — S7291XA Unspecified fracture of right femur, initial encounter for closed fracture: Secondary | ICD-10-CM

## 2020-09-02 HISTORY — DX: Unspecified fracture of right femur, initial encounter for closed fracture: S72.91XA

## 2020-09-02 LAB — CBC
HCT: 38.7 % (ref 36.0–46.0)
HCT: 39.1 % (ref 36.0–46.0)
Hemoglobin: 12.4 g/dL (ref 12.0–15.0)
Hemoglobin: 12.5 g/dL (ref 12.0–15.0)
MCH: 30.8 pg (ref 26.0–34.0)
MCH: 30.5 pg (ref 26.0–34.0)
MCHC: 32 g/dL (ref 30.0–36.0)
MCHC: 32 g/dL (ref 30.0–36.0)
MCV: 96 fL (ref 80.0–100.0)
MCV: 95.4 fL (ref 80.0–100.0)
Platelets: 111 10*3/uL — ABNORMAL LOW (ref 150–400)
Platelets: 106 10*3/uL — ABNORMAL LOW (ref 150–400)
RBC: 4.03 MIL/uL (ref 3.87–5.11)
RBC: 4.1 MIL/uL (ref 3.87–5.11)
RDW: 14.6 % (ref 11.5–15.5)
RDW: 14.7 % (ref 11.5–15.5)
WBC: 9.6 10*3/uL (ref 4.0–10.5)
WBC: 10.4 10*3/uL (ref 4.0–10.5)
nRBC: 0 % (ref 0.0–0.2)
nRBC: 0 % (ref 0.0–0.2)

## 2020-09-02 LAB — PROTIME-INR
INR: 1.1 (ref 0.8–1.2)
Prothrombin Time: 14.6 seconds (ref 11.4–15.2)

## 2020-09-02 LAB — BASIC METABOLIC PANEL
Anion gap: 8 (ref 5–15)
BUN: 18 mg/dL (ref 8–23)
CO2: 26 mmol/L (ref 22–32)
Calcium: 8.6 mg/dL — ABNORMAL LOW (ref 8.9–10.3)
Chloride: 107 mmol/L (ref 98–111)
Creatinine, Ser: 0.79 mg/dL (ref 0.44–1.00)
GFR, Estimated: >60 mL/min (ref >60)
Glucose, Bld: 164 mg/dL — ABNORMAL HIGH (ref 70–99)
Potassium: 3.9 mmol/L (ref 3.5–5.1)
Sodium: 141 mmol/L (ref 135–145)

## 2020-09-02 LAB — HEPARIN LEVEL (UNFRACTIONATED): Heparin Unfractionated: 0.6 IU/mL (ref 0.30–0.70)

## 2020-09-02 LAB — CBG MONITORING, ED: Glucose-Capillary: 132 mg/dL — ABNORMAL HIGH (ref 70–99)

## 2020-09-02 LAB — GLUCOSE, CAPILLARY
Glucose-Capillary: 151 mg/dL — ABNORMAL HIGH (ref 70–99)
Glucose-Capillary: 107 mg/dL — ABNORMAL HIGH (ref 70–99)

## 2020-09-02 LAB — HEMOGLOBIN A1C
Hgb A1c MFr Bld: 5.4 % (ref 4.8–5.6)
Mean Plasma Glucose: 108 mg/dL

## 2020-09-02 LAB — APTT: aPTT: 27 seconds (ref 24–36)

## 2020-09-02 MED ORDER — MEMANTINE HCL 10 MG PO TABS
5.0000 mg | ORAL_TABLET | Freq: Two times a day (BID) | ORAL | Status: DC
  Administered 2020-09-02 – 2020-09-09 (×14): 5 mg via ORAL
  Filled 2020-09-02 (×15): qty 1

## 2020-09-02 MED ORDER — LISINOPRIL 5 MG PO TABS
2.5000 mg | ORAL_TABLET | Freq: Every day | ORAL | Status: DC
  Administered 2020-09-02 – 2020-09-07 (×6): 2.5 mg via ORAL
  Filled 2020-09-02 (×6): qty 1

## 2020-09-02 MED ORDER — MORPHINE SULFATE (PF) 2 MG/ML IV SOLN
0.5000 mg | INTRAVENOUS | Status: DC | PRN
  Administered 2020-09-02: 0.5 mg via INTRAVENOUS
  Filled 2020-09-02: qty 1

## 2020-09-02 MED ORDER — INSULIN ASPART 100 UNIT/ML IJ SOLN
0.0000 [IU] | Freq: Three times a day (TID) | INTRAMUSCULAR | Status: DC
  Administered 2020-09-02: 2 [IU] via SUBCUTANEOUS
  Administered 2020-09-02: 1 [IU] via SUBCUTANEOUS
  Administered 2020-09-03: 2 [IU] via SUBCUTANEOUS
  Administered 2020-09-05 (×2): 1 [IU] via SUBCUTANEOUS
  Administered 2020-09-06: 2 [IU] via SUBCUTANEOUS
  Administered 2020-09-06 – 2020-09-09 (×5): 1 [IU] via SUBCUTANEOUS
  Filled 2020-09-02: qty 0.09

## 2020-09-02 MED ORDER — ESCITALOPRAM OXALATE 20 MG PO TABS
20.0000 mg | ORAL_TABLET | Freq: Every day | ORAL | Status: DC
  Administered 2020-09-02 – 2020-09-09 (×8): 20 mg via ORAL
  Filled 2020-09-02 (×6): qty 1
  Filled 2020-09-02: qty 2
  Filled 2020-09-02: qty 1

## 2020-09-02 MED ORDER — HALOPERIDOL 0.5 MG PO TABS
0.5000 mg | ORAL_TABLET | Freq: Two times a day (BID) | ORAL | Status: DC | PRN
  Filled 2020-09-02: qty 1

## 2020-09-02 MED ORDER — ACETAMINOPHEN 325 MG PO TABS: 650.0000 mg | ORAL_TABLET | Freq: Four times a day (QID) | ORAL | Status: DC | PRN

## 2020-09-02 MED ORDER — SENNOSIDES-DOCUSATE SODIUM 8.6-50 MG PO TABS: 1.0000 | ORAL_TABLET | Freq: Every evening | ORAL | Status: DC | PRN

## 2020-09-02 MED ORDER — POLYETHYLENE GLYCOL 3350 17 G PO PACK
17.0000 g | PACK | Freq: Every day | ORAL | Status: DC
  Administered 2020-09-02 – 2020-09-09 (×7): 17 g via ORAL
  Filled 2020-09-02 (×7): qty 1

## 2020-09-02 MED ORDER — LACTATED RINGERS IV SOLN: INTRAVENOUS | Status: DC

## 2020-09-02 MED ORDER — INSULIN ASPART 100 UNIT/ML IJ SOLN
0.0000 [IU] | Freq: Every day | INTRAMUSCULAR | Status: DC
  Filled 2020-09-02: qty 0.05

## 2020-09-02 MED ORDER — OXYCODONE HCL 5 MG PO TABS: 2.5000 mg | ORAL_TABLET | ORAL | Status: DC | PRN

## 2020-09-02 MED ORDER — PYRIDOSTIGMINE BROMIDE 60 MG PO TABS
60.0000 mg | ORAL_TABLET | Freq: Every day | ORAL | Status: DC
  Administered 2020-09-02 – 2020-09-09 (×8): 60 mg via ORAL
  Filled 2020-09-02 (×8): qty 1

## 2020-09-02 MED ORDER — OXYCODONE HCL 5 MG PO TABS
5.0000 mg | ORAL_TABLET | ORAL | Status: DC | PRN
  Administered 2020-09-03 – 2020-09-04 (×4): 5 mg via ORAL
  Filled 2020-09-02 (×4): qty 1

## 2020-09-02 MED ORDER — HYDROCODONE-ACETAMINOPHEN 5-325 MG PO TABS
1.0000 | ORAL_TABLET | Freq: Four times a day (QID) | ORAL | Status: DC | PRN
  Administered 2020-09-02: 2 via ORAL
  Filled 2020-09-02: qty 2

## 2020-09-02 MED ORDER — MELATONIN 5 MG PO TABS
10.0000 mg | ORAL_TABLET | Freq: Every day | ORAL | Status: DC
  Administered 2020-09-02 – 2020-09-08 (×7): 10 mg via ORAL
  Filled 2020-09-02 (×7): qty 2

## 2020-09-02 MED ORDER — HEPARIN BOLUS VIA INFUSION
1700.0000 [IU] | Freq: Once | INTRAVENOUS | Status: AC
  Administered 2020-09-02: 1700 [IU] via INTRAVENOUS
  Filled 2020-09-02: qty 1700

## 2020-09-02 MED ORDER — HEPARIN (PORCINE) 25000 UT/250ML-% IV SOLN
900.0000 [IU]/h | INTRAVENOUS | Status: DC
  Administered 2020-09-02: 900 [IU]/h via INTRAVENOUS
  Filled 2020-09-02 (×2): qty 250

## 2020-09-02 MED ORDER — HYDROMORPHONE HCL 1 MG/ML IJ SOLN
0.5000 mg | INTRAMUSCULAR | Status: DC | PRN
Start: 2020-09-02 — End: 2020-09-02
  Administered 2020-09-02: 0.5 mg via INTRAVENOUS
  Filled 2020-09-02: qty 1

## 2020-09-02 MED ORDER — ACETAMINOPHEN 500 MG PO TABS
1000.0000 mg | ORAL_TABLET | Freq: Three times a day (TID) | ORAL | Status: DC
  Administered 2020-09-02 – 2020-09-04 (×6): 1000 mg via ORAL
  Filled 2020-09-02 (×6): qty 2

## 2020-09-02 NOTE — Progress Notes (Signed)
TOC CSW received call from [facility] with [facility] [employer] [facility] [phone].  Hassan Rowan stated that pt can get agitated in the event that this happens her coping skill is music/classical.  Pt is from [facility]/secured unit.  Tarynn Garling Tarpley-Carter, MSW, LCSW-A Pronouns:  She, Her, Hers                  [place] ED Transitions of CareClinical Social Worker [email or web address] [phone]

## 2020-09-02 NOTE — H&P (View-Only) (Signed)
Reason for Consult:Right hip fx Referring Physician: Hulen Luster Time called: [id number] Time at bedside: [place] is an 87 y.o. female.  HPI: [name] [name] suffered an unwitnessed fall at the facility where she resides. She c/o right hip pain. She was brought to the ED where x-rays showed a right hip fx and orthopedic surgery was consulted. She is moderately demented and cannot tell me the circumstances of her fall or contribute much to history.  Past Medical History:  Diagnosis Date   Atrial fibrillation ([place]) 10/06/2007   post op   COPD (chronic obstructive pulmonary disease) ([place])    Emphysema on 02/03/13 CXR   CORONARY ARTERY DISEASE 10/06/2007   a. s/p PCI to LAD; b. s/p CABG; c. LHC 07/13/12: Proximal LAD stent patent, LIMA-LAD atretic, D1 occluded, proximal circumflex 30%, mid RCA occluded, SVG-D1 normal, SVG-OM2 normal, SVG-distal RCA normal, EF 40% with diffuse HK   Dizziness    History of colonic polyps 10/30/2009   No polyps in 2011. No repeat.     HYPERLIPIDEMIA 10/06/2007   HYPERTENSION 10/06/2007   LBBB 09/06/2008   Left renal mass    MELANOMA 09/06/2008   MOES PROCEDURE RIGHT   MYOCARDIAL INFARCTION, HX OF 10/06/2007   NICM (nonischemic cardiomyopathy) ([place])    Echocardiogram 07/12/12: EF 25-30%, diffuse HK, mild AI, mild MR, mild LAE   PERSONAL HX COLONIC POLYPS 10/30/2009   Presence of permanent cardiac pacemaker    Syncope    UTERINE CANCER, HX OF 09/06/2008   VARICOSE VEIN 09/29/2009    Past Surgical History:  Procedure Laterality Date   ABDOMINAL HYSTERECTOMY  1988   BI-VENTRICULAR PACEMAKER INSERTION (CRT-P)  02/02/2013   [employer] Jude, serial no. #[id number]    CARDIAC CATHETERIZATION  08/30/2008   CORONARY ANGIOPLASTY WITH STENT PLACEMENT  2009   CORONARY ARTERY BYPASS GRAFT  2004   IR RADIOLOGIST EVAL & MGMT  02/08/2017   IR RADIOLOGIST EVAL & MGMT  05/26/2017   IR RADIOLOGIST EVAL & MGMT  07/19/2017   IR RADIOLOGIST EVAL & MGMT  05/04/2018   LEFT HEART CATHETERIZATION WITH  CORONARY ANGIOGRAM Bilateral 07/13/2012   Procedure: LEFT HEART CATHETERIZATION WITH CORONARY ANGIOGRAM;  Surgeon: Peter M Martinique, MD;  Location: [facility] CATH LAB;  Service: Cardiovascular;  Laterality: Bilateral;   OOPHORECTOMY     PERMANENT PACEMAKER INSERTION N/A 02/02/2013   Procedure: PERMANENT PACEMAKER INSERTION;  Surgeon: Evans Lance, MD;  Location: [facility] CATH LAB;  Service: Cardiovascular;  Laterality: N/A;   RADIOLOGY WITH ANESTHESIA Left 03/28/2017   Procedure: RENAL CRYO ABLATION;  Surgeon: Aletta Edouard, MD;  Location: [facility] ORS;  Service: Radiology;  Laterality: Left;   right distal pretibeal     melanoma    Family History  Problem Relation Age of Onset   Stroke Mother    Heart attack Father    Colon cancer Neg Hx     Social History:  reports that she has never smoked. She has never used smokeless tobacco. She reports current alcohol use. She reports that she does not use drugs.  Allergies:  Allergies  Allergen Reactions   Aricept [Donepezil] Shortness Of Breath and Other (See Comments)    Hallucinations and loss of sensation in legs   Nitroglycerin Other (See Comments)    Oral spray form causes rapid drop in blood pressure.   sensitive to tabs which causes rapid drop in blood pressure. Is ok to use oral spray form    Medications: I have reviewed the patient's current medications.  Results for orders placed or performed during the hospital encounter of 09/01/20 (from the past 48 hour(s))  CBC WITH DIFFERENTIAL     Status: Abnormal   Collection Time: 09/01/20  7:51 PM  Result Value Ref Range   WBC 9.1 4.0 - 10.5 K/uL   RBC 4.19 3.87 - 5.11 MIL/uL   Hemoglobin 12.7 12.0 - 15.0 g/dL   HCT 39.5 36.0 - 46.0 %   MCV 94.3 80.0 - 100.0 fL   MCH 30.3 26.0 - 34.0 pg   MCHC 32.2 30.0 - 36.0 g/dL   RDW 14.6 11.5 - 15.5 %   Platelets 120 (L) 150 - 400 K/uL    Comment: SPECIMEN CHECKED FOR CLOTS Immature Platelet Fraction may be clinically indicated, consider ordering this  additional test [id number] REPEATED TO VERIFY    nRBC 0.0 0.0 - 0.2 %   Neutrophils Relative % 84 %   Neutro Abs 7.6 1.7 - 7.7 K/uL   Lymphocytes Relative 10 %   Lymphs Abs 0.9 0.7 - 4.0 K/uL   Monocytes Relative 5 %   Monocytes Absolute 0.5 0.1 - 1.0 K/uL   Eosinophils Relative 1 %   Eosinophils Absolute 0.1 0.0 - 0.5 K/uL   Basophils Relative 0 %   Basophils Absolute 0.0 0.0 - 0.1 K/uL   Immature Granulocytes 0 %   Abs Immature Granulocytes 0.04 0.00 - 0.07 K/uL    Comment: Performed at [facility], [place] [address] [place], [place] [address]  Protime-INR     Status: Abnormal   Collection Time: 09/01/20  7:51 PM  Result Value Ref Range   Prothrombin Time 15.6 (H) 11.4 - 15.2 seconds   INR 1.2 0.8 - 1.2    Comment: (NOTE) INR goal varies based on device and disease states. Performed at [facility], [place] [address], [place], [place] [address]   Type and screen [place]     Status: None   Collection Time: 09/01/20  7:51 PM  Result Value Ref Range   ABO/RH(D) A POS    Antibody Screen NEG    Sample Expiration      09/04/2020,2359 Performed at [facility], [place] [address] [place], [place] [address]   Comprehensive metabolic panel     Status: Abnormal   Collection Time: 09/01/20  7:51 PM  Result Value Ref Range   Sodium 141 135 - 145 mmol/L   Potassium 4.4 3.5 - 5.1 mmol/L   Chloride 109 98 - 111 mmol/L   CO2 24 22 - 32 mmol/L   Glucose, Bld 124 (H) 70 - 99 mg/dL    Comment: Glucose reference range applies only to samples taken after fasting for at least 8 hours.   BUN 19 8 - 23 mg/dL   Creatinine, Ser 0.91 0.44 - 1.00 mg/dL   Calcium 8.7 (L) 8.9 - 10.3 mg/dL   Total Protein 6.4 (L) 6.5 - 8.1 g/dL   Albumin 4.0 3.5 - 5.0 g/dL   AST 21 15 - 41 U/L   ALT 11 0 - 44 U/L   Alkaline Phosphatase 56 38 - 126 U/L   Total Bilirubin 0.8 0.3 - 1.2 mg/dL   GFR, Estimated >60 >60 mL/min    Comment:  (NOTE) Calculated using the CKD-EPI Creatinine Equation (2021)    Anion gap 8 5 - 15    Comment: Performed at [facility], [place] [address], [place], [place] [address]  Resp Panel by RT-PCR (Flu A&B,  Covid) Nasopharyngeal Swab     Status: None   Collection Time: 09/01/20  8:28 PM   Specimen: Nasopharyngeal Swab; Nasopharyngeal(NP) swabs in vial transport medium  Result Value Ref Range   SARS Coronavirus 2 by RT PCR NEGATIVE NEGATIVE    Comment: (NOTE) SARS-CoV-2 target nucleic acids are NOT DETECTED.  The SARS-CoV-2 RNA is generally detectable in upper respiratory specimens during the acute phase of infection. The lowest concentration of SARS-CoV-2 viral copies this assay can detect is 138 copies/mL. A negative result does not preclude SARS-Cov-2 infection and should not be used as the sole basis for treatment or other patient management decisions. A negative result may occur with  improper specimen collection/handling, submission of specimen other than nasopharyngeal swab, presence of viral mutation(s) within the areas targeted by this assay, and inadequate number of viral copies(<138 copies/mL). A negative result must be combined with clinical observations, patient history, and epidemiological information. The expected result is Negative.  Fact Sheet for Patients:  EntrepreneurPulse.com.au  Fact Sheet for Healthcare Providers:  IncredibleEmployment.be  This test is no t yet approved or cleared by the [place] FDA and  has been authorized for detection and/or diagnosis of SARS-CoV-2 by FDA under an Emergency Use Authorization (EUA). This EUA will remain  in effect (meaning this test can be used) for the duration of the COVID-19 declaration under Section 564(b)(1) of the Act, 21 U.S.C.section 360bbb-3(b)(1), unless the authorization is terminated  or revoked sooner.       Influenza A by PCR NEGATIVE NEGATIVE    Influenza B by PCR NEGATIVE NEGATIVE    Comment: (NOTE) The Xpert Xpress SARS-CoV-2/FLU/RSV plus assay is intended as an aid in the diagnosis of influenza from Nasopharyngeal swab specimens and should not be used as a sole basis for treatment. Nasal washings and aspirates are unacceptable for Xpert Xpress SARS-CoV-2/FLU/RSV testing.  Fact Sheet for Patients: EntrepreneurPulse.com.au  Fact Sheet for Healthcare Providers: IncredibleEmployment.be  This test is not yet approved or cleared by the [place] FDA and has been authorized for detection and/or diagnosis of SARS-CoV-2 by FDA under an Emergency Use Authorization (EUA). This EUA will remain in effect (meaning this test can be used) for the duration of the COVID-19 declaration under Section 564(b)(1) of the Act, 21 U.S.C. section 360bbb-3(b)(1), unless the authorization is terminated or revoked.  Performed at [facility], [place] [address] [place], [place] [address]   CBC     Status: Abnormal   Collection Time: 09/02/20  7:14 AM  Result Value Ref Range   WBC 9.6 4.0 - 10.5 K/uL   RBC 4.03 3.87 - 5.11 MIL/uL   Hemoglobin 12.4 12.0 - 15.0 g/dL   HCT 38.7 36.0 - 46.0 %   MCV 96.0 80.0 - 100.0 fL   MCH 30.8 26.0 - 34.0 pg   MCHC 32.0 30.0 - 36.0 g/dL   RDW 14.6 11.5 - 15.5 %   Platelets 111 (L) 150 - 400 K/uL    Comment: SPECIMEN CHECKED FOR CLOTS Immature Platelet Fraction may be clinically indicated, consider ordering this additional test [id number]    nRBC 0.0 0.0 - 0.2 %    Comment: Performed at [facility], [place] [address] [place], [place] [address]  Basic metabolic panel     Status: Abnormal   Collection Time: 09/02/20  7:14 AM  Result Value Ref Range   Sodium 141 135 - 145 mmol/L   Potassium 3.9 3.5 - 5.1 mmol/L   Chloride 107 98 -  111 mmol/L   CO2 26 22 - 32 mmol/L   Glucose, Bld 164 (H) 70 - 99 mg/dL    Comment: Glucose reference  range applies only to samples taken after fasting for at least 8 hours.   BUN 18 8 - 23 mg/dL   Creatinine, Ser 0.79 0.44 - 1.00 mg/dL   Calcium 8.6 (L) 8.9 - 10.3 mg/dL   GFR, Estimated >60 >60 mL/min    Comment: (NOTE) Calculated using the CKD-EPI Creatinine Equation (2021)    Anion gap 8 5 - 15    Comment: Performed at [facility], [place] [address], [place], [place] [address]  CBG monitoring, ED     Status: Abnormal   Collection Time: 09/02/20  8:50 AM  Result Value Ref Range   Glucose-Capillary 132 (H) 70 - 99 mg/dL    Comment: Glucose reference range applies only to samples taken after fasting for at least 8 hours.    DG Chest 1 View  Result Date: 09/01/2020 CLINICAL DATA:  Fall. EXAM: CHEST  1 VIEW COMPARISON:  07/31/2019 FINDINGS: Mild cardiomegaly. Mild aortic tortuosity. Post median sternotomy. Left-sided pacemaker in place. Mild vascular congestion without pulmonary edema. Subsegmental left lung base atelectasis. No confluent consolidation, pleural effusion or pneumothorax. No acute osseous abnormalities are seen. IMPRESSION: 1. Mild cardiomegaly with vascular congestion. 2. Subsegmental left lung base atelectasis. Electronically Signed   By: Keith Rake M.D.   On: 09/01/2020 20:11   CT Head Wo Contrast  Result Date: 09/01/2020 CLINICAL DATA:  Fall EXAM: CT HEAD WITHOUT CONTRAST CT CERVICAL SPINE WITHOUT CONTRAST TECHNIQUE: Multidetector CT imaging of the head and cervical spine was performed following the standard protocol without intravenous contrast. Multiplanar CT image reconstructions of the cervical spine were also generated. COMPARISON:  None. FINDINGS: CT HEAD FINDINGS Brain: No evidence of acute infarction, hemorrhage, hydrocephalus, extra-axial collection or mass lesion/mass effect. Periventricular and deep white matter hypodensity. Mild global cerebral volume loss. Vascular: No hyperdense vessel or unexpected calcification. Skull: Normal. Negative  for fracture or focal lesion. Sinuses/Orbits: No acute finding. Other: None. CT CERVICAL SPINE FINDINGS Alignment: Degenerative straightening and reversal of the normal cervical lordosis. Skull base and vertebrae: No acute fracture. No primary bone lesion or focal pathologic process. Soft tissues and spinal canal: No prevertebral fluid or swelling. No visible canal hematoma. Disc levels: Moderate multilevel disc space height loss and osteophytosis. Upper chest: Negative. Other: None. IMPRESSION: 1. No acute intracranial pathology. Small-vessel white matter disease and mild global cerebral volume loss. 2. No fracture or static subluxation of the cervical spine. 3. Moderate multilevel disc space height loss and osteophytosis. Electronically Signed   By: Eddie Candle M.D.   On: 09/01/2020 21:27   CT Cervical Spine Wo Contrast  Result Date: 09/01/2020 CLINICAL DATA:  Fall EXAM: CT HEAD WITHOUT CONTRAST CT CERVICAL SPINE WITHOUT CONTRAST TECHNIQUE: Multidetector CT imaging of the head and cervical spine was performed following the standard protocol without intravenous contrast. Multiplanar CT image reconstructions of the cervical spine were also generated. COMPARISON:  None. FINDINGS: CT HEAD FINDINGS Brain: No evidence of acute infarction, hemorrhage, hydrocephalus, extra-axial collection or mass lesion/mass effect. Periventricular and deep white matter hypodensity. Mild global cerebral volume loss. Vascular: No hyperdense vessel or unexpected calcification. Skull: Normal. Negative for fracture or focal lesion. Sinuses/Orbits: No acute finding. Other: None. CT CERVICAL SPINE FINDINGS Alignment: Degenerative straightening and reversal of the normal cervical lordosis. Skull base and vertebrae: No acute fracture. No primary bone lesion or focal pathologic  process. Soft tissues and spinal canal: No prevertebral fluid or swelling. No visible canal hematoma. Disc levels: Moderate multilevel disc space height loss and  osteophytosis. Upper chest: Negative. Other: None. IMPRESSION: 1. No acute intracranial pathology. Small-vessel white matter disease and mild global cerebral volume loss. 2. No fracture or static subluxation of the cervical spine. 3. Moderate multilevel disc space height loss and osteophytosis. Electronically Signed   By: Eddie Candle M.D.   On: 09/01/2020 21:27   CT HIP RIGHT WO CONTRAST  Result Date: 09/02/2020 CLINICAL DATA:  Right hip fracture EXAM: CT OF THE RIGHT HIP WITHOUT CONTRAST TECHNIQUE: Multidetector CT imaging of the right hip was performed according to the standard protocol. Multiplanar CT image reconstructions were also generated. COMPARISON:  X-ray 09/01/2020 FINDINGS: Bones/Joint/Cartilage Acute transcervical right femoral neck fracture with varus and anterior apex angulation. Distal fracture component is superiorly displaced. Fracture does not appear to involve the intertrochanteric region. No fracture extension into the femoral head. Hip joint is intact without dislocation. Moderate right hip osteoarthritis. Small right hip joint effusion. Bones appear diffusely demineralized. The visualized portion of the right hemipelvis appears intact. Ligaments Suboptimally assessed by CT. Muscles and Tendons Enthesopathic changes at the greater trochanter. No acute musculotendinous injury by CT. Soft tissues Soft tissue induration within the subcutaneous soft tissues overlying the lateral aspect of the greater trochanter. No organized hematoma. No inguinal lymphadenopathy. Scattered vascular calcifications. IMPRESSION: 1. Acute mildly displaced and angulated right femoral neck fracture. 2. Moderate right hip osteoarthritis. 3. Diffuse osseous demineralization. Electronically Signed   By: Davina Poke D.O.   On: 09/02/2020 10:42   DG Knee Complete 4 Views Right  Result Date: 09/02/2020 CLINICAL DATA:  History of recent falls, initial encounter EXAM: RIGHT KNEE - COMPLETE 4+ VIEW COMPARISON:   07/17/2012 FINDINGS: Meniscal calcifications are noted. Mild medial joint space narrowing is noted. No joint effusion is seen. No acute fracture or dislocation is noted. Mild patellofemoral degenerative changes are noted as well. IMPRESSION: Degenerative change without acute abnormality. Electronically Signed   By: Inez Catalina M.D.   On: 09/02/2020 09:17   DG Hip Unilat  With Pelvis 2-3 Views Right  Result Date: 09/01/2020 CLINICAL DATA:  Pain after fall. EXAM: DG HIP (WITH OR WITHOUT PELVIS) 2-3V RIGHT COMPARISON:  None. FINDINGS: Displaced right femoral neck fracture with proximal migration of the femoral shaft. The femoral head remains seated. Pubic rami are intact. Pubic symphysis and sacroiliac joints are congruent. Surgical clips in the pelvis. IMPRESSION: Displaced right femoral neck fracture. Electronically Signed   By: Keith Rake M.D.   On: 09/01/2020 20:10    Review of Systems  Unable to perform ROS: Dementia   Blood pressure (!) 151/78, pulse 71, temperature (!) 97.3 F (36.3 C), temperature source Oral, resp. rate 18, SpO2 95 %. Physical Exam Constitutional:      General: She is not in acute distress.    Appearance: She is well-developed. She is not diaphoretic.  HENT:     Head: Normocephalic and atraumatic.  Eyes:     General: No scleral icterus.       Right eye: No discharge.        Left eye: No discharge.     Conjunctiva/sclera: Conjunctivae normal.  Cardiovascular:     Rate and Rhythm: Normal rate and regular rhythm.  Pulmonary:     Effort: Pulmonary effort is normal. No respiratory distress.  Musculoskeletal:     Cervical back: Normal range of motion.  Comments: RLE No traumatic wounds, ecchymosis, or rash  Mod TTP hip  No knee or ankle effusion  Knee stable to varus/ valgus and anterior/posterior stress  Sens DPN, SPN, TN intact  Motor EHL, ext, flex, evers 5/5  DP 2+, PT 0, No significant edema  Skin:    General: Skin is warm and dry.  Neurological:      Mental Status: She is alert.  Psychiatric:        Behavior: Behavior normal.     Assessment/Plan: Right hip fx -- Plan hemiarthroplasty Thursday by Dr. Lyla Glassing. Please keep NPO after MN Wednesday and hold Eliquis. Multiple medical problems including paroxysmal atrial fibrillation on Eliquis, hypertension, CAD, COPD, and dementia -- per primary service    Lisette Abu, PA-C Orthopedic Surgery [phone] 09/02/2020, 11:30 AM

## 2020-09-02 NOTE — ED Notes (Signed)
Pt refusing cardiac monitoring at this time. Pt confused and will not keep leads on. Pt laying comfortably in bed. Pt not attempting to leave bed.

## 2020-09-02 NOTE — ED Notes (Signed)
CBG done. [place]

## 2020-09-02 NOTE — Progress Notes (Signed)
PROGRESS NOTE    [name] [name]  EZM:[id number] DOB: Sep 04, 1931 DOA: 09/01/2020 PCP: Marin Olp, MD   Chief Complaint  Patient presents with  . Fall  . Leg Pain  . Leg Injury   Brief Narrative:   [name] [name] is [name] [name] 87 y.o. female with medical history significant for paroxysmal atrial fibrillation, hypertension, CAD, COPD dementia who presents after an unwitnessed fall at facility.  Patient was found down on the ground on her right side with Reilynn Lauro small skin tear of her right elbow.  She complained of pain in her right hip and groin region.  Patient does not remember the fall and is not sure of what happened.  Patient cannot provide any history secondary to her dementia.  No family or staff from facility are present.  She is found to have Delshon Blanchfield right femoral neck fracture on x-ray.  CT of her head was obtained emergency room and was negative.  She is anticoagulated on Eliquis chronically for Aquita Simmering. Fib.   She's been admitted for R hip fracture.   Assessment & Plan:   Principal Problem:   Fracture of femoral neck, right (HCC) Active Problems:   Essential hypertension   Paroxysmal atrial fibrillation (HCC)   Pacemaker -CRT-[employer]   Dementia (HCC)   Current use of long term anticoagulation   Chronic obstructive pulmonary disease (HCC)   Closed hip fracture, right, initial encounter ([place])   Closed fracture of right femur, unspecified fracture morphology, initial encounter ([place])  Displaced R Femoral Neck Fracture  Unwitnessed Fall CT C spine/head without acute intracranial pathology.  No fracture or static subluxation of cervical spine.  R knee plain films without acute abnormality R hip CT with acute mildly displaced and angulated R femoral neck fracture, moderate R hip osteoarthritis RCRI 2 for hx HF and CAD, discussed with [name] (daughter), she's aware of her higher risk.  Given hx HF, will follow echo.  Unlikely we'll plan on any additional preop w/u after this.  Prior to injury,  she ambulated without cane/walker, was able to dress, needed some assistance with toileting (lived at [facility]).   Holding eliquis at this time Scheduled apap, oxy prn, morphine.  Miralax.  PT post op Ortho c/s, appreciate assistance.  Planning for surgery on Thursday.   Atrial Fibrillation  S/p Pacemaker Placement Chadsvasc at least 5 (age, F, HTN, CAD - echo pending for EF, borderline). Given high chadsvasc, will plan to bridge while holding eliquis - heparin per pharmacy  CAD Not currently on antiplatelets or statin  HFpEF  Mildly Reduced EF Echo 2019 with EF 45-50%, grade III diastolic dysfunction Will follow repeat echo Appears euvolemic at this time  Dementia Delirium precautions [place] for family to stay overnight to help avoid delirium (unfortunately, family out of town for graduation this wknd) Continue home namenda, melatonin, lexapro  Prolonged QTc Follow mag, caution with qt prolonging meds Hold home prn haldol Continue lexapro for now   History of autonomic dysfunction? Continue pyridostigmine  Hypertension Continue lisinopril for now, bp's up, suspect related to pain/agitation/discomfort Would hold lisinopril on day of surgery  Thrombocytopenia Degree of chronicity, follow   DVT prophylaxis: heparin Code Status: DNR - discussed with [name] Family Communication: [name] Disposition:   Status is: Inpatient  Remains inpatient appropriate because:Inpatient level of care appropriate due to severity of illness   Dispo: The patient is from: [facility]              Anticipated  d/c is to: SNF              Patient currently is not medically stable to d/c.   Difficult to place patient No       Consultants:   Orthopedics   Procedures:  none  Antimicrobials:  Anti-infectives (From admission, onward)   None        Subjective: Trying to get to the bathroom confused  Objective: Vitals:   09/02/20 0932 09/02/20 1115  09/02/20 1251 09/02/20 1441  BP: (!) 172/81 (!) 151/78 (!) 149/98 (!) 141/89  Pulse: 70 71 68 70  Resp: 17 18 18 18   Temp:   98 F (36.7 C)   TempSrc:   Oral   SpO2: 90% 95% 98% 98%   No intake or output data in the 24 hours ending 09/02/20 1603 There were no vitals filed for this visit.  Examination:  General exam: Appears calm and comfortable  Respiratory system: Clear to auscultation. Respiratory effort normal. Cardiovascular system: S1 & S2 heard, RRR.  Gastrointestinal system: Abdomen is nondistended, soft and nontender Central nervous system: Alert, but confused. No focal neurological deficits. Extremities: RLE shortened and externally rotated Skin: No rashes, lesions or ulcers    Data Reviewed: I have personally reviewed following labs and imaging studies  CBC: Recent Labs  Lab 09/01/20 1951 09/02/20 0714  WBC 9.1 9.6  NEUTROABS 7.6  --   HGB 12.7 12.4  HCT 39.5 38.7  MCV 94.3 96.0  PLT 120* 111*    Basic Metabolic Panel: Recent Labs  Lab 09/01/20 1951 09/02/20 0714  NA 141 141  K 4.4 3.9  CL 109 107  CO2 24 26  GLUCOSE 124* 164*  BUN 19 18  CREATININE 0.91 0.79  CALCIUM 8.7* 8.6*    GFR: CrCl cannot be calculated (Unknown ideal weight.).  Liver Function Tests: Recent Labs  Lab 09/01/20 1951  AST 21  ALT 11  ALKPHOS 56  BILITOT 0.8  PROT 6.4*  ALBUMIN 4.0    CBG: Recent Labs  Lab 09/02/20 0850  GLUCAP 132*     Recent Results (from the past 240 hour(s))  Resp Panel by RT-PCR (Flu Emanuel Campos&B, Covid) Nasopharyngeal Swab     Status: None   Collection Time: 09/01/20  8:28 PM   Specimen: Nasopharyngeal Swab; Nasopharyngeal(NP) swabs in vial transport medium  Result Value Ref Range Status   SARS Coronavirus 2 by RT PCR NEGATIVE NEGATIVE Final    Comment: (NOTE) SARS-CoV-2 target nucleic acids are NOT DETECTED.  The SARS-CoV-2 RNA is generally detectable in upper respiratory specimens during the acute phase of infection. The  lowest concentration of SARS-CoV-2 viral copies this assay can detect is 138 copies/mL. Yarimar Lavis negative result does not preclude SARS-Cov-2 infection and should not be used as the sole basis for treatment or other patient management decisions. Gao Mitnick negative result may occur with  improper specimen collection/handling, submission of specimen other than nasopharyngeal swab, presence of viral mutation(s) within the areas targeted by this assay, and inadequate number of viral copies(<138 copies/mL). Anabeth Chilcott negative result must be combined with clinical observations, patient history, and epidemiological information. The expected result is Negative.  Fact Sheet for Patients:  EntrepreneurPulse.com.au  Fact Sheet for Healthcare Providers:  IncredibleEmployment.be  This test is no t yet approved or cleared by the [place] FDA and  has been authorized for detection and/or diagnosis of SARS-CoV-2 by FDA under an Emergency Use Authorization (EUA). This EUA will remain  in effect (meaning this test can  be used) for the duration of the COVID-19 declaration under Section 564(b)(1) of the Act, 21 U.S.C.section 360bbb-3(b)(1), unless the authorization is terminated  or revoked sooner.       Influenza Ngina Royer by PCR NEGATIVE NEGATIVE Final   Influenza B by PCR NEGATIVE NEGATIVE Final    Comment: (NOTE) The Xpert Xpress SARS-CoV-2/FLU/RSV plus assay is intended as an aid in the diagnosis of influenza from Nasopharyngeal swab specimens and should not be used as Chelsi Warr sole basis for treatment. Nasal washings and aspirates are unacceptable for Xpert Xpress SARS-CoV-2/FLU/RSV testing.  Fact Sheet for Patients: EntrepreneurPulse.com.au  Fact Sheet for Healthcare Providers: IncredibleEmployment.be  This test is not yet approved or cleared by the [place] FDA and has been authorized for detection and/or diagnosis of SARS-CoV-2 by FDA under  an Emergency Use Authorization (EUA). This EUA will remain in effect (meaning this test can be used) for the duration of the COVID-19 declaration under Section 564(b)(1) of the Act, 21 U.S.C. section 360bbb-3(b)(1), unless the authorization is terminated or revoked.  Performed at [facility], [place] [address] [place], [place] [address]          Radiology Studies: DG Chest 1 View  Result Date: 09/01/2020 CLINICAL DATA:  Fall. EXAM: CHEST  1 VIEW COMPARISON:  07/31/2019 FINDINGS: Mild cardiomegaly. Mild aortic tortuosity. Post median sternotomy. Left-sided pacemaker in place. Mild vascular congestion without pulmonary edema. Subsegmental left lung base atelectasis. No confluent consolidation, pleural effusion or pneumothorax. No acute osseous abnormalities are seen. IMPRESSION: 1. Mild cardiomegaly with vascular congestion. 2. Subsegmental left lung base atelectasis. Electronically Signed   By: Keith Rake M.D.   On: 09/01/2020 20:11   CT Head Wo Contrast  Result Date: 09/01/2020 CLINICAL DATA:  Fall EXAM: CT HEAD WITHOUT CONTRAST CT CERVICAL SPINE WITHOUT CONTRAST TECHNIQUE: Multidetector CT imaging of the head and cervical spine was performed following the standard protocol without intravenous contrast. Multiplanar CT image reconstructions of the cervical spine were also generated. COMPARISON:  None. FINDINGS: CT HEAD FINDINGS Brain: No evidence of acute infarction, hemorrhage, hydrocephalus, extra-axial collection or mass lesion/mass effect. Periventricular and deep white matter hypodensity. Mild global cerebral volume loss. Vascular: No hyperdense vessel or unexpected calcification. Skull: Normal. Negative for fracture or focal lesion. Sinuses/Orbits: No acute finding. Other: None. CT CERVICAL SPINE FINDINGS Alignment: Degenerative straightening and reversal of the normal cervical lordosis. Skull base and vertebrae: No acute fracture. No primary bone lesion or focal  pathologic process. Soft tissues and spinal canal: No prevertebral fluid or swelling. No visible canal hematoma. Disc levels: Moderate multilevel disc space height loss and osteophytosis. Upper chest: Negative. Other: None. IMPRESSION: 1. No acute intracranial pathology. Small-vessel white matter disease and mild global cerebral volume loss. 2. No fracture or static subluxation of the cervical spine. 3. Moderate multilevel disc space height loss and osteophytosis. Electronically Signed   By: Eddie Candle M.D.   On: 09/01/2020 21:27   CT Cervical Spine Wo Contrast  Result Date: 09/01/2020 CLINICAL DATA:  Fall EXAM: CT HEAD WITHOUT CONTRAST CT CERVICAL SPINE WITHOUT CONTRAST TECHNIQUE: Multidetector CT imaging of the head and cervical spine was performed following the standard protocol without intravenous contrast. Multiplanar CT image reconstructions of the cervical spine were also generated. COMPARISON:  None. FINDINGS: CT HEAD FINDINGS Brain: No evidence of acute infarction, hemorrhage, hydrocephalus, extra-axial collection or mass lesion/mass effect. Periventricular and deep white matter hypodensity. Mild global cerebral volume loss. Vascular: No hyperdense vessel or unexpected calcification. Skull: Normal. Negative for  fracture or focal lesion. Sinuses/Orbits: No acute finding. Other: None. CT CERVICAL SPINE FINDINGS Alignment: Degenerative straightening and reversal of the normal cervical lordosis. Skull base and vertebrae: No acute fracture. No primary bone lesion or focal pathologic process. Soft tissues and spinal canal: No prevertebral fluid or swelling. No visible canal hematoma. Disc levels: Moderate multilevel disc space height loss and osteophytosis. Upper chest: Negative. Other: None. IMPRESSION: 1. No acute intracranial pathology. Small-vessel white matter disease and mild global cerebral volume loss. 2. No fracture or static subluxation of the cervical spine. 3. Moderate multilevel disc space  height loss and osteophytosis. Electronically Signed   By: Eddie Candle M.D.   On: 09/01/2020 21:27   CT HIP RIGHT WO CONTRAST  Result Date: 09/02/2020 CLINICAL DATA:  Right hip fracture EXAM: CT OF THE RIGHT HIP WITHOUT CONTRAST TECHNIQUE: Multidetector CT imaging of the right hip was performed according to the standard protocol. Multiplanar CT image reconstructions were also generated. COMPARISON:  X-ray 09/01/2020 FINDINGS: Bones/Joint/Cartilage Acute transcervical right femoral neck fracture with varus and anterior apex angulation. Distal fracture component is superiorly displaced. Fracture does not appear to involve the intertrochanteric region. No fracture extension into the femoral head. Hip joint is intact without dislocation. Moderate right hip osteoarthritis. Small right hip joint effusion. Bones appear diffusely demineralized. The visualized portion of the right hemipelvis appears intact. Ligaments Suboptimally assessed by CT. Muscles and Tendons Enthesopathic changes at the greater trochanter. No acute musculotendinous injury by CT. Soft tissues Soft tissue induration within the subcutaneous soft tissues overlying the lateral aspect of the greater trochanter. No organized hematoma. No inguinal lymphadenopathy. Scattered vascular calcifications. IMPRESSION: 1. Acute mildly displaced and angulated right femoral neck fracture. 2. Moderate right hip osteoarthritis. 3. Diffuse osseous demineralization. Electronically Signed   By: Davina Poke D.O.   On: 09/02/2020 10:42   DG Knee Complete 4 Views Right  Result Date: 09/02/2020 CLINICAL DATA:  History of recent falls, initial encounter EXAM: RIGHT KNEE - COMPLETE 4+ VIEW COMPARISON:  07/17/2012 FINDINGS: Meniscal calcifications are noted. Mild medial joint space narrowing is noted. No joint effusion is seen. No acute fracture or dislocation is noted. Mild patellofemoral degenerative changes are noted as well. IMPRESSION: Degenerative change without  acute abnormality. Electronically Signed   By: Inez Catalina M.D.   On: 09/02/2020 09:17   DG Hip Unilat  With Pelvis 2-3 Views Right  Result Date: 09/01/2020 CLINICAL DATA:  Pain after fall. EXAM: DG HIP (WITH OR WITHOUT PELVIS) 2-3V RIGHT COMPARISON:  None. FINDINGS: Displaced right femoral neck fracture with proximal migration of the femoral shaft. The femoral head remains seated. Pubic rami are intact. Pubic symphysis and sacroiliac joints are congruent. Surgical clips in the pelvis. IMPRESSION: Displaced right femoral neck fracture. Electronically Signed   By: Keith Rake M.D.   On: 09/01/2020 20:10        Scheduled Meds: . escitalopram  20 mg Oral Daily  . insulin aspart  0-5 Units Subcutaneous QHS  . insulin aspart  0-9 Units Subcutaneous TID WC  . lisinopril  2.5 mg Oral Daily   Continuous Infusions: . lactated ringers 50 mL/hr at 09/02/20 0408     LOS: 1 day    Time spent: over 30 min    Fayrene Helper, MD [facility]   To contact the attending provider between 7A-7P or the covering provider during after hours 7P-7A, please log into the web site [email or web address] and access using universal [place] password for that web site. If you  do not have the password, please call the hospital operator.  09/02/2020, 4:03 PM

## 2020-09-02 NOTE — Progress Notes (Signed)
  Echocardiogram 2D Echocardiogram has been performed.  Merrie Roof F 09/02/2020, 5:23 PM

## 2020-09-02 NOTE — Progress Notes (Addendum)
ANTICOAGULATION CONSULT NOTE - Initial Consult  Pharmacy Consult for Heparin while Eliquis on hold Indication: atrial fibrillation  Allergies  Allergen Reactions  . Aricept [Donepezil] Shortness Of Breath and Other (See Comments)    Hallucinations and loss of sensation in legs  . Nitroglycerin Other (See Comments)    Oral spray form causes rapid drop in blood pressure.   sensitive to tabs which causes rapid drop in blood pressure. Is ok to use oral spray form    Patient Measurements: Height: 5\' 6"  (167.6 cm) Weight: 58.6 kg (129 lb 3.2 oz) IBW/kg (Calculated) : 59.3 Heparin Dosing Weight: 58.6 kg  Vital Signs: Temp: 99.7 F (37.6 C) (05/24 1603) Temp Source: Oral (05/24 1603) BP: 172/70 (05/24 1603) Pulse Rate: 70 (05/24 1603)  Labs: Recent Labs    09/01/20 1951 09/02/20 0714  HGB 12.7 12.4  HCT 39.5 38.7  PLT 120* 111*  LABPROT 15.6*  --   INR 1.2  --   CREATININE 0.91 0.79    Estimated Creatinine Clearance: 46.7 mL/min (by C-G formula based on SCr of 0.79 mg/dL).   Medical History: Past Medical History:  Diagnosis Date  . Atrial fibrillation ([place]) 10/06/2007   post op  . COPD (chronic obstructive pulmonary disease) ([place])    Emphysema on 02/03/13 CXR  . CORONARY ARTERY DISEASE 10/06/2007   a. s/p PCI to LAD; b. s/p CABG; c. LHC 07/13/12: Proximal LAD stent patent, LIMA-LAD atretic, D1 occluded, proximal circumflex 30%, mid RCA occluded, SVG-D1 normal, SVG-OM2 normal, SVG-distal RCA normal, EF 40% with diffuse HK  . Dizziness   . History of colonic polyps 10/30/2009   No polyps in 2011. No repeat.    Marland Kitchen HYPERLIPIDEMIA 10/06/2007  . HYPERTENSION 10/06/2007  . LBBB 09/06/2008  . Left renal mass   . MELANOMA 09/06/2008   MOES PROCEDURE RIGHT  . MYOCARDIAL INFARCTION, HX OF 10/06/2007  . NICM (nonischemic cardiomyopathy) ([place])    Echocardiogram 07/12/12: EF 25-30%, diffuse HK, mild AI, mild MR, mild LAE  . PERSONAL HX COLONIC POLYPS 10/30/2009  . Presence of permanent  cardiac pacemaker   . Syncope   . UTERINE CANCER, HX OF 09/06/2008  . VARICOSE VEIN 09/29/2009    Medications:  Scheduled:  . acetaminophen  1,000 mg Oral Q8H  . escitalopram  20 mg Oral Daily  . insulin aspart  0-5 Units Subcutaneous QHS  . insulin aspart  0-9 Units Subcutaneous TID WC  . lisinopril  2.5 mg Oral Daily  . melatonin  10 mg Oral QHS  . memantine  5 mg Oral BID  . polyethylene glycol  17 g Oral Daily  . pyridostigmine  60 mg Oral Daily   Infusions:  . lactated ringers 50 mL/hr at 09/02/20 0408   PRN: acetaminophen **FOLLOWED BY** [START ON 09/05/2020] acetaminophen, HYDROcodone-acetaminophen, morphine injection, oxyCODONE **OR** oxyCODONE, senna-docusate  Assessment: 87 yo female presents after a fall at her facility sustaining a right hip fracture.  She takes eliquis chronically for afib.  Pharmacy is consulted to dose IV heparin while eliquis is on hold for surgery 5/26.  Last dose of Eliquis was 5/23 at 08:00 Baseline heparin level 0.6, elevated as expected with recent eliquis use Baseline PTT 27, normal CBC: H/H wnl, Plts low 111 but appears consistent with previous values SCr 0.79  Goal of Therapy:   PTT 66-102 seconds Heparin level 0.3-0.7 units/ml Monitor platelets by anticoagulation protocol: Yes   Plan:   Heparin 1700 units IV bolus  Heparin IV infusion 900 units/hr  Check HL and PTT in 8hrs - once these begin to correlate, can monitor heparin using heparin levels only  Daily CBC  Follow up plans for holding heparin drip prior to surgery 5/26  Peggyann Juba, PharmD, BCPS Pharmacy: [phone] 09/02/2020,4:57 PM

## 2020-09-02 NOTE — Consult Note (Addendum)
Reason for Consult:Right hip fx Referring Physician: Hulen Luster Time called: [id number] Time at bedside: [name] [name] is an 87 y.o. female.  HPI: Terrance suffered an unwitnessed fall at the facility where she resides. She c/o right hip pain. She was brought to the ED where x-rays showed a right hip fx and orthopedic surgery was consulted. She is moderately demented and cannot tell me the circumstances of her fall or contribute much to history.  Past Medical History:  Diagnosis Date   Atrial fibrillation ([place]) 10/06/2007   post op   COPD (chronic obstructive pulmonary disease) ([place])    Emphysema on 02/03/13 CXR   CORONARY ARTERY DISEASE 10/06/2007   a. s/p PCI to LAD; b. s/p CABG; c. LHC 07/13/12: Proximal LAD stent patent, LIMA-LAD atretic, D1 occluded, proximal circumflex 30%, mid RCA occluded, SVG-D1 normal, SVG-OM2 normal, SVG-distal RCA normal, EF 40% with diffuse HK   Dizziness    History of colonic polyps 10/30/2009   No polyps in 2011. No repeat.     HYPERLIPIDEMIA 10/06/2007   HYPERTENSION 10/06/2007   LBBB 09/06/2008   Left renal mass    MELANOMA 09/06/2008   MOES PROCEDURE RIGHT   MYOCARDIAL INFARCTION, HX OF 10/06/2007   NICM (nonischemic cardiomyopathy) ([place])    Echocardiogram 07/12/12: EF 25-30%, diffuse HK, mild AI, mild MR, mild LAE   PERSONAL HX COLONIC POLYPS 10/30/2009   Presence of permanent cardiac pacemaker    Syncope    UTERINE CANCER, HX OF 09/06/2008   VARICOSE VEIN 09/29/2009    Past Surgical History:  Procedure Laterality Date   ABDOMINAL HYSTERECTOMY  1988   BI-VENTRICULAR PACEMAKER INSERTION (CRT-P)  02/02/2013   St[employer] Jude, serial no. #[id number]    CARDIAC CATHETERIZATION  08/30/2008   CORONARY ANGIOPLASTY WITH STENT PLACEMENT  2009   CORONARY ARTERY BYPASS GRAFT  2004   IR RADIOLOGIST EVAL & MGMT  02/08/2017   IR RADIOLOGIST EVAL & MGMT  05/26/2017   IR RADIOLOGIST EVAL & MGMT  07/19/2017   IR RADIOLOGIST EVAL & MGMT  05/04/2018   LEFT HEART CATHETERIZATION WITH  CORONARY ANGIOGRAM Bilateral 07/13/2012   Procedure: LEFT HEART CATHETERIZATION WITH CORONARY ANGIOGRAM;  Surgeon: Peter M Martinique, MD;  Location: [facility] CATH LAB;  Service: Cardiovascular;  Laterality: Bilateral;   OOPHORECTOMY     PERMANENT PACEMAKER INSERTION N/A 02/02/2013   Procedure: PERMANENT PACEMAKER INSERTION;  Surgeon: Evans Lance, MD;  Location: [facility] CATH LAB;  Service: Cardiovascular;  Laterality: N/A;   RADIOLOGY WITH ANESTHESIA Left 03/28/2017   Procedure: RENAL CRYO ABLATION;  Surgeon: Aletta Edouard, MD;  Location: [facility] ORS;  Service: Radiology;  Laterality: Left;   right distal pretibeal     melanoma    Family History  Problem Relation Age of Onset   Stroke Mother    Heart attack Father    Colon cancer Neg Hx     Social History:  reports that she has never smoked. She has never used smokeless tobacco. She reports current alcohol use. She reports that she does not use drugs.  Allergies:  Allergies  Allergen Reactions   Aricept [Donepezil] Shortness Of Breath and Other (See Comments)    Hallucinations and loss of sensation in legs   Nitroglycerin Other (See Comments)    Oral spray form causes rapid drop in blood pressure.   sensitive to tabs which causes rapid drop in blood pressure. Is ok to use oral spray form    Medications: I have reviewed the patient's current medications.  Results for orders placed or performed during the hospital encounter of 09/01/20 (from the past 48 hour(s))  CBC WITH DIFFERENTIAL     Status: Abnormal   Collection Time: 09/01/20  7:51 PM  Result Value Ref Range   WBC 9.1 4.0 - 10.5 K/uL   RBC 4.19 3.87 - 5.11 MIL/uL   Hemoglobin 12.7 12.0 - 15.0 g/dL   HCT 39.5 36.0 - 46.0 %   MCV 94.3 80.0 - 100.0 fL   MCH 30.3 26.0 - 34.0 pg   MCHC 32.2 30.0 - 36.0 g/dL   RDW 14.6 11.5 - 15.5 %   Platelets 120 (L) 150 - 400 K/uL    Comment: SPECIMEN CHECKED FOR CLOTS Immature Platelet Fraction may be clinically indicated, consider ordering this  additional test [id number] REPEATED TO VERIFY    nRBC 0.0 0.0 - 0.2 %   Neutrophils Relative % 84 %   Neutro Abs 7.6 1.7 - 7.7 K/uL   Lymphocytes Relative 10 %   Lymphs Abs 0.9 0.7 - 4.0 K/uL   Monocytes Relative 5 %   Monocytes Absolute 0.5 0.1 - 1.0 K/uL   Eosinophils Relative 1 %   Eosinophils Absolute 0.1 0.0 - 0.5 K/uL   Basophils Relative 0 %   Basophils Absolute 0.0 0.0 - 0.1 K/uL   Immature Granulocytes 0 %   Abs Immature Granulocytes 0.04 0.00 - 0.07 K/uL    Comment: Performed at [facility], [place] [address] [place], [place] [address]  Protime-INR     Status: Abnormal   Collection Time: 09/01/20  7:51 PM  Result Value Ref Range   Prothrombin Time 15.6 (H) 11.4 - 15.2 seconds   INR 1.2 0.8 - 1.2    Comment: (NOTE) INR goal varies based on device and disease states. Performed at [facility], [place] [address], [place], [place] [address]   Type and screen [place]     Status: None   Collection Time: 09/01/20  7:51 PM  Result Value Ref Range   ABO/RH(D) A POS    Antibody Screen NEG    Sample Expiration      09/04/2020,2359 Performed at [facility], [place] [address] [place], [place] [address]   Comprehensive metabolic panel     Status: Abnormal   Collection Time: 09/01/20  7:51 PM  Result Value Ref Range   Sodium 141 135 - 145 mmol/L   Potassium 4.4 3.5 - 5.1 mmol/L   Chloride 109 98 - 111 mmol/L   CO2 24 22 - 32 mmol/L   Glucose, Bld 124 (H) 70 - 99 mg/dL    Comment: Glucose reference range applies only to samples taken after fasting for at least 8 hours.   BUN 19 8 - 23 mg/dL   Creatinine, Ser 0.91 0.44 - 1.00 mg/dL   Calcium 8.7 (L) 8.9 - 10.3 mg/dL   Total Protein 6.4 (L) 6.5 - 8.1 g/dL   Albumin 4.0 3.5 - 5.0 g/dL   AST 21 15 - 41 U/L   ALT 11 0 - 44 U/L   Alkaline Phosphatase 56 38 - 126 U/L   Total Bilirubin 0.8 0.3 - 1.2 mg/dL   GFR, Estimated >60 >60 mL/min    Comment:  (NOTE) Calculated using the CKD-EPI Creatinine Equation (2021)    Anion gap 8 5 - 15    Comment: Performed at [facility], [place] [address], [place], [place] [address]  Resp Panel by RT-PCR (Flu A&B,  Covid) Nasopharyngeal Swab     Status: None   Collection Time: 09/01/20  8:28 PM   Specimen: Nasopharyngeal Swab; Nasopharyngeal(NP) swabs in vial transport medium  Result Value Ref Range   SARS Coronavirus 2 by RT PCR NEGATIVE NEGATIVE    Comment: (NOTE) SARS-CoV-2 target nucleic acids are NOT DETECTED.  The SARS-CoV-2 RNA is generally detectable in upper respiratory specimens during the acute phase of infection. The lowest concentration of SARS-CoV-2 viral copies this assay can detect is 138 copies/mL. A negative result does not preclude SARS-Cov-2 infection and should not be used as the sole basis for treatment or other patient management decisions. A negative result may occur with  improper specimen collection/handling, submission of specimen other than nasopharyngeal swab, presence of viral mutation(s) within the areas targeted by this assay, and inadequate number of viral copies(<138 copies/mL). A negative result must be combined with clinical observations, patient history, and epidemiological information. The expected result is Negative.  Fact Sheet for Patients:  EntrepreneurPulse.com.au  Fact Sheet for Healthcare Providers:  IncredibleEmployment.be  This test is no t yet approved or cleared by the [place] FDA and  has been authorized for detection and/or diagnosis of SARS-CoV-2 by FDA under an Emergency Use Authorization (EUA). This EUA will remain  in effect (meaning this test can be used) for the duration of the COVID-19 declaration under Section 564(b)(1) of the Act, 21 U.S.C.section 360bbb-3(b)(1), unless the authorization is terminated  or revoked sooner.       Influenza A by PCR NEGATIVE NEGATIVE    Influenza B by PCR NEGATIVE NEGATIVE    Comment: (NOTE) The Xpert Xpress SARS-CoV-2/FLU/RSV plus assay is intended as an aid in the diagnosis of influenza from Nasopharyngeal swab specimens and should not be used as a sole basis for treatment. Nasal washings and aspirates are unacceptable for Xpert Xpress SARS-CoV-2/FLU/RSV testing.  Fact Sheet for Patients: EntrepreneurPulse.com.au  Fact Sheet for Healthcare Providers: IncredibleEmployment.be  This test is not yet approved or cleared by the [place] FDA and has been authorized for detection and/or diagnosis of SARS-CoV-2 by FDA under an Emergency Use Authorization (EUA). This EUA will remain in effect (meaning this test can be used) for the duration of the COVID-19 declaration under Section 564(b)(1) of the Act, 21 U.S.C. section 360bbb-3(b)(1), unless the authorization is terminated or revoked.  Performed at [facility], [place] [address], [place], [place] [address]   CBC     Status: Abnormal   Collection Time: 09/02/20  7:14 AM  Result Value Ref Range   WBC 9.6 4.0 - 10.5 K/uL   RBC 4.03 3.87 - 5.11 MIL/uL   Hemoglobin 12.4 12.0 - 15.0 g/dL   HCT 38.7 36.0 - 46.0 %   MCV 96.0 80.0 - 100.0 fL   MCH 30.8 26.0 - 34.0 pg   MCHC 32.0 30.0 - 36.0 g/dL   RDW 14.6 11.5 - 15.5 %   Platelets 111 (L) 150 - 400 K/uL    Comment: SPECIMEN CHECKED FOR CLOTS Immature Platelet Fraction may be clinically indicated, consider ordering this additional test [id number]    nRBC 0.0 0.0 - 0.2 %    Comment: Performed at [facility], [place] [address], [place], [place] [address]  Basic metabolic panel     Status: Abnormal   Collection Time: 09/02/20  7:14 AM  Result Value Ref Range   Sodium 141 135 - 145 mmol/L   Potassium 3.9 3.5 - 5.1 mmol/L   Chloride 107 98 -  111 mmol/L   CO2 26 22 - 32 mmol/L   Glucose, Bld 164 (H) 70 - 99 mg/dL    Comment: Glucose reference  range applies only to samples taken after fasting for at least 8 hours.   BUN 18 8 - 23 mg/dL   Creatinine, Ser 0.79 0.44 - 1.00 mg/dL   Calcium 8.6 (L) 8.9 - 10.3 mg/dL   GFR, Estimated >60 >60 mL/min    Comment: (NOTE) Calculated using the CKD-EPI Creatinine Equation (2021)    Anion gap 8 5 - 15    Comment: Performed at [facility], [place] [address] [place], [place] [address]  CBG monitoring, ED     Status: Abnormal   Collection Time: 09/02/20  8:50 AM  Result Value Ref Range   Glucose-Capillary 132 (H) 70 - 99 mg/dL    Comment: Glucose reference range applies only to samples taken after fasting for at least 8 hours.    DG Chest 1 View  Result Date: 09/01/2020 CLINICAL DATA:  Fall. EXAM: CHEST  1 VIEW COMPARISON:  07/31/2019 FINDINGS: Mild cardiomegaly. Mild aortic tortuosity. Post median sternotomy. Left-sided pacemaker in place. Mild vascular congestion without pulmonary edema. Subsegmental left lung base atelectasis. No confluent consolidation, pleural effusion or pneumothorax. No acute osseous abnormalities are seen. IMPRESSION: 1. Mild cardiomegaly with vascular congestion. 2. Subsegmental left lung base atelectasis. Electronically Signed   By: Keith Rake M.D.   On: 09/01/2020 20:11   CT Head Wo Contrast  Result Date: 09/01/2020 CLINICAL DATA:  Fall EXAM: CT HEAD WITHOUT CONTRAST CT CERVICAL SPINE WITHOUT CONTRAST TECHNIQUE: Multidetector CT imaging of the head and cervical spine was performed following the standard protocol without intravenous contrast. Multiplanar CT image reconstructions of the cervical spine were also generated. COMPARISON:  None. FINDINGS: CT HEAD FINDINGS Brain: No evidence of acute infarction, hemorrhage, hydrocephalus, extra-axial collection or mass lesion/mass effect. Periventricular and deep white matter hypodensity. Mild global cerebral volume loss. Vascular: No hyperdense vessel or unexpected calcification. Skull: Normal. Negative  for fracture or focal lesion. Sinuses/Orbits: No acute finding. Other: None. CT CERVICAL SPINE FINDINGS Alignment: Degenerative straightening and reversal of the normal cervical lordosis. Skull base and vertebrae: No acute fracture. No primary bone lesion or focal pathologic process. Soft tissues and spinal canal: No prevertebral fluid or swelling. No visible canal hematoma. Disc levels: Moderate multilevel disc space height loss and osteophytosis. Upper chest: Negative. Other: None. IMPRESSION: 1. No acute intracranial pathology. Small-vessel white matter disease and mild global cerebral volume loss. 2. No fracture or static subluxation of the cervical spine. 3. Moderate multilevel disc space height loss and osteophytosis. Electronically Signed   By: Eddie Candle M.D.   On: 09/01/2020 21:27   CT Cervical Spine Wo Contrast  Result Date: 09/01/2020 CLINICAL DATA:  Fall EXAM: CT HEAD WITHOUT CONTRAST CT CERVICAL SPINE WITHOUT CONTRAST TECHNIQUE: Multidetector CT imaging of the head and cervical spine was performed following the standard protocol without intravenous contrast. Multiplanar CT image reconstructions of the cervical spine were also generated. COMPARISON:  None. FINDINGS: CT HEAD FINDINGS Brain: No evidence of acute infarction, hemorrhage, hydrocephalus, extra-axial collection or mass lesion/mass effect. Periventricular and deep white matter hypodensity. Mild global cerebral volume loss. Vascular: No hyperdense vessel or unexpected calcification. Skull: Normal. Negative for fracture or focal lesion. Sinuses/Orbits: No acute finding. Other: None. CT CERVICAL SPINE FINDINGS Alignment: Degenerative straightening and reversal of the normal cervical lordosis. Skull base and vertebrae: No acute fracture. No primary bone lesion or focal pathologic  process. Soft tissues and spinal canal: No prevertebral fluid or swelling. No visible canal hematoma. Disc levels: Moderate multilevel disc space height loss and  osteophytosis. Upper chest: Negative. Other: None. IMPRESSION: 1. No acute intracranial pathology. Small-vessel white matter disease and mild global cerebral volume loss. 2. No fracture or static subluxation of the cervical spine. 3. Moderate multilevel disc space height loss and osteophytosis. Electronically Signed   By: Eddie Candle M.D.   On: 09/01/2020 21:27   CT HIP RIGHT WO CONTRAST  Result Date: 09/02/2020 CLINICAL DATA:  Right hip fracture EXAM: CT OF THE RIGHT HIP WITHOUT CONTRAST TECHNIQUE: Multidetector CT imaging of the right hip was performed according to the standard protocol. Multiplanar CT image reconstructions were also generated. COMPARISON:  X-ray 09/01/2020 FINDINGS: Bones/Joint/Cartilage Acute transcervical right femoral neck fracture with varus and anterior apex angulation. Distal fracture component is superiorly displaced. Fracture does not appear to involve the intertrochanteric region. No fracture extension into the femoral head. Hip joint is intact without dislocation. Moderate right hip osteoarthritis. Small right hip joint effusion. Bones appear diffusely demineralized. The visualized portion of the right hemipelvis appears intact. Ligaments Suboptimally assessed by CT. Muscles and Tendons Enthesopathic changes at the greater trochanter. No acute musculotendinous injury by CT. Soft tissues Soft tissue induration within the subcutaneous soft tissues overlying the lateral aspect of the greater trochanter. No organized hematoma. No inguinal lymphadenopathy. Scattered vascular calcifications. IMPRESSION: 1. Acute mildly displaced and angulated right femoral neck fracture. 2. Moderate right hip osteoarthritis. 3. Diffuse osseous demineralization. Electronically Signed   By: Davina Poke D.O.   On: 09/02/2020 10:42   DG Knee Complete 4 Views Right  Result Date: 09/02/2020 CLINICAL DATA:  History of recent falls, initial encounter EXAM: RIGHT KNEE - COMPLETE 4+ VIEW COMPARISON:   07/17/2012 FINDINGS: Meniscal calcifications are noted. Mild medial joint space narrowing is noted. No joint effusion is seen. No acute fracture or dislocation is noted. Mild patellofemoral degenerative changes are noted as well. IMPRESSION: Degenerative change without acute abnormality. Electronically Signed   By: Inez Catalina M.D.   On: 09/02/2020 09:17   DG Hip Unilat  With Pelvis 2-3 Views Right  Result Date: 09/01/2020 CLINICAL DATA:  Pain after fall. EXAM: DG HIP (WITH OR WITHOUT PELVIS) 2-3V RIGHT COMPARISON:  None. FINDINGS: Displaced right femoral neck fracture with proximal migration of the femoral shaft. The femoral head remains seated. Pubic rami are intact. Pubic symphysis and sacroiliac joints are congruent. Surgical clips in the pelvis. IMPRESSION: Displaced right femoral neck fracture. Electronically Signed   By: Keith Rake M.D.   On: 09/01/2020 20:10    Review of Systems  Unable to perform ROS: Dementia   Blood pressure (!) 151/78, pulse 71, temperature (!) 97.3 F (36.3 C), temperature source Oral, resp. rate 18, SpO2 95 %. Physical Exam Constitutional:      General: She is not in acute distress.    Appearance: She is well-developed. She is not diaphoretic.  HENT:     Head: Normocephalic and atraumatic.  Eyes:     General: No scleral icterus.       Right eye: No discharge.        Left eye: No discharge.     Conjunctiva/sclera: Conjunctivae normal.  Cardiovascular:     Rate and Rhythm: Normal rate and regular rhythm.  Pulmonary:     Effort: Pulmonary effort is normal. No respiratory distress.  Musculoskeletal:     Cervical back: Normal range of motion.  Comments: RLE No traumatic wounds, ecchymosis, or rash  Mod TTP hip  No knee or ankle effusion  Knee stable to varus/ valgus and anterior/posterior stress  Sens DPN, SPN, TN intact  Motor EHL, ext, flex, evers 5/5  DP 2+, PT 0, No significant edema  Skin:    General: Skin is warm and dry.  Neurological:      Mental Status: She is alert.  Psychiatric:        Behavior: Behavior normal.     Assessment/Plan: Right hip fx -- Plan hemiarthroplasty Thursday by Dr. Lyla Glassing. Please keep NPO after MN Wednesday and hold Eliquis. Multiple medical problems including paroxysmal atrial fibrillation on Eliquis, hypertension, CAD, COPD, and dementia -- per primary service    Lisette Abu, PA-C Orthopedic Surgery [phone] 09/02/2020, 11:30 AM

## 2020-09-03 DIAGNOSIS — I5042 Chronic combined systolic (congestive) and diastolic (congestive) heart failure: Secondary | ICD-10-CM

## 2020-09-03 DIAGNOSIS — Z01810 Encounter for preprocedural cardiovascular examination: Secondary | ICD-10-CM

## 2020-09-03 DIAGNOSIS — Z950 Presence of cardiac pacemaker: Secondary | ICD-10-CM

## 2020-09-03 DIAGNOSIS — I480 Paroxysmal atrial fibrillation: Secondary | ICD-10-CM | POA: Diagnosis not present

## 2020-09-03 LAB — CBC WITH DIFFERENTIAL/PLATELET
Abs Immature Granulocytes: 0.03 10*3/uL (ref 0.00–0.07)
Basophils Absolute: 0 10*3/uL (ref 0.0–0.1)
Basophils Relative: 0 %
Eosinophils Absolute: 0.1 10*3/uL (ref 0.0–0.5)
Eosinophils Relative: 1 %
HCT: 38.7 % (ref 36.0–46.0)
Hemoglobin: 12.2 g/dL (ref 12.0–15.0)
Immature Granulocytes: 0 %
Lymphocytes Relative: 16 %
Lymphs Abs: 1.4 10*3/uL (ref 0.7–4.0)
MCH: 30.5 pg (ref 26.0–34.0)
MCHC: 31.5 g/dL (ref 30.0–36.0)
MCV: 96.8 fL (ref 80.0–100.0)
Monocytes Absolute: 0.7 10*3/uL (ref 0.1–1.0)
Monocytes Relative: 8 %
Neutro Abs: 6.6 10*3/uL (ref 1.7–7.7)
Neutrophils Relative %: 75 %
Platelets: 99 10*3/uL — ABNORMAL LOW (ref 150–400)
RBC: 4 MIL/uL (ref 3.87–5.11)
RDW: 14.6 % (ref 11.5–15.5)
WBC: 8.8 10*3/uL (ref 4.0–10.5)
nRBC: 0 % (ref 0.0–0.2)

## 2020-09-03 LAB — ECHOCARDIOGRAM COMPLETE
AR max vel: 2.4 cm2
AV Area VTI: 2.37 cm2
AV Area mean vel: 2.14 cm2
AV Mean grad: 3 mmHg
AV Peak grad: 5.7 mmHg
Ao pk vel: 1.19 m/s
Height: 66 in
S' Lateral: 3.88 cm
Weight: 2067.2 oz

## 2020-09-03 LAB — COMPREHENSIVE METABOLIC PANEL
ALT: 23 U/L (ref 0–44)
AST: 34 U/L (ref 15–41)
Albumin: 3.7 g/dL (ref 3.5–5.0)
Alkaline Phosphatase: 57 U/L (ref 38–126)
Anion gap: 8 (ref 5–15)
BUN: 20 mg/dL (ref 8–23)
CO2: 29 mmol/L (ref 22–32)
Calcium: 8.9 mg/dL (ref 8.9–10.3)
Chloride: 105 mmol/L (ref 98–111)
Creatinine, Ser: 0.87 mg/dL (ref 0.44–1.00)
GFR, Estimated: >60 mL/min (ref >60)
Glucose, Bld: 120 mg/dL — ABNORMAL HIGH (ref 70–99)
Potassium: 3.9 mmol/L (ref 3.5–5.1)
Sodium: 142 mmol/L (ref 135–145)
Total Bilirubin: 1.3 mg/dL — ABNORMAL HIGH (ref 0.3–1.2)
Total Protein: 6 g/dL — ABNORMAL LOW (ref 6.5–8.1)

## 2020-09-03 LAB — MAGNESIUM: Magnesium: 2 mg/dL (ref 1.7–2.4)

## 2020-09-03 LAB — GLUCOSE, CAPILLARY
Glucose-Capillary: 110 mg/dL — ABNORMAL HIGH (ref 70–99)
Glucose-Capillary: 122 mg/dL — ABNORMAL HIGH (ref 70–99)
Glucose-Capillary: 162 mg/dL — ABNORMAL HIGH (ref 70–99)
Glucose-Capillary: 141 mg/dL — ABNORMAL HIGH (ref 70–99)

## 2020-09-03 LAB — APTT
aPTT: 82 seconds — ABNORMAL HIGH (ref 24–36)
aPTT: 66 seconds — ABNORMAL HIGH (ref 24–36)

## 2020-09-03 LAB — PHOSPHORUS: Phosphorus: 4 mg/dL (ref 2.5–4.6)

## 2020-09-03 LAB — HEPARIN LEVEL (UNFRACTIONATED): Heparin Unfractionated: 0.85 IU/mL — ABNORMAL HIGH (ref 0.30–0.70)

## 2020-09-03 MED ORDER — HEPARIN (PORCINE) 25000 UT/250ML-% IV SOLN
950.0000 [IU]/h | INTRAVENOUS | Status: DC
  Administered 2020-09-03: 950 [IU]/h via INTRAVENOUS
  Filled 2020-09-03: qty 250

## 2020-09-03 MED ORDER — FUROSEMIDE 10 MG/ML IJ SOLN
40.0000 mg | Freq: Once | INTRAMUSCULAR | Status: AC
  Administered 2020-09-03: 40 mg via INTRAVENOUS
  Filled 2020-09-03: qty 4

## 2020-09-03 MED ORDER — POVIDONE-IODINE 10 % EX SWAB
2.0000 "application " | Freq: Once | CUTANEOUS | Status: AC
  Administered 2020-09-03: 2 via TOPICAL

## 2020-09-03 MED ORDER — CHLORHEXIDINE GLUCONATE 4 % EX LIQD
60.0000 mL | Freq: Once | CUTANEOUS | Status: AC
  Administered 2020-09-03: 4 via TOPICAL
  Filled 2020-09-03: qty 60

## 2020-09-03 MED ORDER — ENSURE ENLIVE PO LIQD
237.0000 mL | Freq: Two times a day (BID) | ORAL | Status: DC
  Administered 2020-09-03 – 2020-09-09 (×9): 237 mL via ORAL

## 2020-09-03 MED ORDER — CEFAZOLIN SODIUM-DEXTROSE 2-4 GM/100ML-% IV SOLN
2.0000 g | INTRAVENOUS | Status: AC
  Administered 2020-09-04: 2 g via INTRAVENOUS
  Filled 2020-09-03 (×2): qty 100

## 2020-09-03 NOTE — Progress Notes (Signed)
PT Cancellation Note  Patient Details Name: [name] [name] MRN: [id number] DOB: April 16, 1931   Cancelled Treatment:    Reason Eval/Treat Not Completed: Patient not medically ready --surgery planned for 5/26. will await ortho orders and recommendations. will sign off. thanks.    [facility]  Office: [phone] Pager: [phone]

## 2020-09-03 NOTE — Progress Notes (Signed)
PROGRESS NOTE    [name] [name]  DJT:[id number] DOB: 06-21-1931 DOA: 09/01/2020 PCP: Marin Olp, MD   Brief Narrative: This 87 y.o.femalewith PMH significant forparoxysmal atrial fibrillation, hypertension, CAD, COPD, dementia who presents after an unwitnessed fall at facility. Patient was found down on the ground on her right side with a small skin tear of her right elbow. She complained of pain in her right hip and groin region. Patient does not remember the fall and is not sure of what happened. Patient cannot provide any history secondary to her dementia. No family or staff from facility are present. She is found to have a right femoral neck fracture on x-ray. CT of her head was negative. She is anticoagulated on Eliquis chronically for A. Fib.   She's been admitted for R hip fracture.   Ortho consulted and scheduled to have ORIF on 5/26.  Assessment & Plan:   Principal Problem:   Fracture of femoral neck, right (HCC) Active Problems:   Essential hypertension   Paroxysmal atrial fibrillation (HCC)   Pacemaker -CRT-[employer]. [employer]   Dementia (HCC)   Current use of long term anticoagulation   Chronic obstructive pulmonary disease (HCC)   Closed hip fracture, right, initial encounter ([place])   Closed fracture of right femur, unspecified fracture morphology, initial encounter ([place])   Displaced R Femoral Neck Fracture  Unwitnessed Fall. She presented with unwitnessed fall at [facility] care unit. CT C spine/ head without acute intracranial pathology.  No fracture or static subluxation of cervical spine.  Right knee x-ray without acute abnormality. R hip CT with acute mildly displaced and angulated R femoral neck fracture, moderate R hip osteoarthritis. Discussed with [name] (daughter), she's aware of her higher risk.  Given hx HF,  Echo completed showed reduced EF.  Prior to injury, she ambulated without cane/walker, was able to dress, needed some assistance with  toileting (lived at [facility]).   Holding eliquis at this time, while bridging with heparin Continue scheduled apap, oxy prn, morphine.  Miralax.  Continue PT post op Ortho c/s, appreciate assistance.    Scheduled for ORIF on 5/26.  Atrial Fibrillation  S/p Pacemaker Placement Chadsvasc at least 5 (age, F, HTN, CAD - echo shows EF 35 to 40%., borderline). Given high chadsvasc, will plan to bridge while holding eliquis - heparin per pharmacy  CAD Not currently on antiplatelets or statin.  HFpEF  Mildly Reduced EF Echo 2019 with EF 45-50%, grade III diastolic dysfunction Repeat echo shows reduced EF 35 to 40%. Appears euvolemic at this time. Cardio consulted,  patient might need preop clearance.  Dementia Delirium precautions Allow family to stay overnight to help avoid delirium  Continue home namenda, melatonin, lexapro  Prolonged QTc Follow mag, caution with qt prolonging meds Hold home prn haldol. Continue lexapro for now   History of autonomic dysfunction? Continue pyridostigmine  Hypertension Continue lisinopril for now, BP slightly up,  suspect related to pain/agitation/discomfort Would hold lisinopril on day of surgery  Thrombocytopenia Chronic.  Continue to monitor   DVT prophylaxis: Heparin Code Status: DNR Family Communication:No family at bed side. Disposition Plan:   Status is: Inpatient  Remains inpatient appropriate because:Inpatient level of care appropriate due to severity of illness   Dispo: The patient is from: [facility] memory care.              Anticipated d/c is to: SNF              Patient currently is not medically  stable to d/c.   Difficult to place patient No   Consultants:   Ortho  Procedures: Scheduled ORIF Antimicrobials:  Anti-infectives (From admission, onward)   None      Subjective: Patient was seen and examined at bedside.  She is sitting comfortably on the chair,  denies any pain, fall. She does not  remember about the fall, overnight events noted.  Objective: Vitals:   09/02/20 1603 09/02/20 2015 09/03/20 0014 09/03/20 0430  BP: (!) 172/70 (!) 150/65 (!) 153/72 (!) 149/74  Pulse: 70 70 69 70  Resp: (!) 22  18   Temp: 99.7 F (37.6 C) 99.2 F (37.3 C) 97.7 F (36.5 C) 98.5 F (36.9 C)  TempSrc: Oral Oral Oral Oral  SpO2: (!) 89% 94% 100% 97%  Weight: 58.6 kg     Height: 5\' 6"  (1.676 m)       Intake/Output Summary (Last 24 hours) at 09/03/2020 1340 Last data filed at 09/03/2020 0815 Gross per 24 hour  Intake 2040.22 ml  Output --  Net 2040.22 ml   Filed Weights   09/02/20 1603  Weight: 58.6 kg    Examination:  General exam: Appears calm and comfortable , not in any acute distress. Respiratory system: Clear to auscultation. Respiratory effort normal. Cardiovascular system: S1 & S2 heard, RRR. No JVD, murmurs, rubs, gallops or clicks. No pedal edema. Gastrointestinal system: Abdomen is nondistended, soft and nontender. No organomegaly or masses felt. Normal bowel sounds heard. Central nervous system: Alert and oriented. No focal neurological deficits. Extremities: Symmetric 5 x 5 power.  Right hip tenderness+,. Skin: No rashes, lesions or ulcers Psychiatry: Judgement and insight appear normal. Mood & affect appropriate.     Data Reviewed: I have personally reviewed following labs and imaging studies  CBC: Recent Labs  Lab 09/01/20 1951 09/02/20 0714 09/02/20 1715 09/03/20 0211  WBC 9.1 9.6 10.4 8.8  NEUTROABS 7.6  --   --  6.6  HGB 12.7 12.4 12.5 12.2  HCT 39.5 38.7 39.1 38.7  MCV 94.3 96.0 95.4 96.8  PLT 120* 111* 106* 99*   Basic Metabolic Panel: Recent Labs  Lab 09/01/20 1951 09/02/20 0714 09/03/20 0211  NA 141 141 142  K 4.4 3.9 3.9  CL 109 107 105  CO2 24 26 29   GLUCOSE 124* 164* 120*  BUN 19 18 20   CREATININE 0.91 0.79 0.87  CALCIUM 8.7* 8.6* 8.9  MG  --   --  2.0  PHOS  --   --  4.0   GFR: Estimated Creatinine Clearance: 42.9 mL/min  (by C-G formula based on SCr of 0.87 mg/dL). Liver Function Tests: Recent Labs  Lab 09/01/20 1951 09/03/20 0211  AST 21 34  ALT 11 23  ALKPHOS 56 57  BILITOT 0.8 1.3*  PROT 6.4* 6.0*  ALBUMIN 4.0 3.7   No results for input(s): LIPASE, AMYLASE in the last 168 hours. No results for input(s): AMMONIA in the last 168 hours. Coagulation Profile: Recent Labs  Lab 09/01/20 1951 09/02/20 1711  INR 1.2 1.1   Cardiac Enzymes: No results for input(s): CKTOTAL, CKMB, CKMBINDEX, TROPONINI in the last 168 hours. BNP (last 3 results) No results for input(s): PROBNP in the last 8760 hours. HbA1C: Recent Labs    09/02/20 0714  HGBA1C 5.4   CBG: Recent Labs  Lab 09/02/20 0850 09/02/20 1720 09/02/20 2017 09/03/20 0802 09/03/20 1301  GLUCAP 132* 151* 107* 110* 122*   Lipid Profile: No results for input(s): CHOL, HDL, LDLCALC, TRIG, CHOLHDL, LDLDIRECT  in the last 72 hours. Thyroid Function Tests: No results for input(s): TSH, T4TOTAL, FREET4, T3FREE, THYROIDAB in the last 72 hours. Anemia Panel: No results for input(s): VITAMINB12, FOLATE, FERRITIN, TIBC, IRON, RETICCTPCT in the last 72 hours. Sepsis Labs: No results for input(s): PROCALCITON, LATICACIDVEN in the last 168 hours.  Recent Results (from the past 240 hour(s))  Resp Panel by RT-PCR (Flu A&B, Covid) Nasopharyngeal Swab     Status: None   Collection Time: 09/01/20  8:28 PM   Specimen: Nasopharyngeal Swab; Nasopharyngeal(NP) swabs in vial transport medium  Result Value Ref Range Status   SARS Coronavirus 2 by RT PCR NEGATIVE NEGATIVE Final    Comment: (NOTE) SARS-CoV-2 target nucleic acids are NOT DETECTED.  The SARS-CoV-2 RNA is generally detectable in upper respiratory specimens during the acute phase of infection. The lowest concentration of SARS-CoV-2 viral copies this assay can detect is 138 copies/mL. A negative result does not preclude SARS-Cov-2 infection and should not be used as the sole basis for  treatment or other patient management decisions. A negative result may occur with  improper specimen collection/handling, submission of specimen other than nasopharyngeal swab, presence of viral mutation(s) within the areas targeted by this assay, and inadequate number of viral copies(<138 copies/mL). A negative result must be combined with clinical observations, patient history, and epidemiological information. The expected result is Negative.  Fact Sheet for Patients:  EntrepreneurPulse.com.au  Fact Sheet for Healthcare Providers:  IncredibleEmployment.be  This test is no t yet approved or cleared by the [place] FDA and  has been authorized for detection and/or diagnosis of SARS-CoV-2 by FDA under an Emergency Use Authorization (EUA). This EUA will remain  in effect (meaning this test can be used) for the duration of the COVID-19 declaration under Section 564(b)(1) of the Act, 21 U.S.C.section 360bbb-3(b)(1), unless the authorization is terminated  or revoked sooner.       Influenza A by PCR NEGATIVE NEGATIVE Final   Influenza B by PCR NEGATIVE NEGATIVE Final    Comment: (NOTE) The Xpert Xpress SARS-CoV-2/FLU/RSV plus assay is intended as an aid in the diagnosis of influenza from Nasopharyngeal swab specimens and should not be used as a sole basis for treatment. Nasal washings and aspirates are unacceptable for Xpert Xpress SARS-CoV-2/FLU/RSV testing.  Fact Sheet for Patients: EntrepreneurPulse.com.au  Fact Sheet for Healthcare Providers: IncredibleEmployment.be  This test is not yet approved or cleared by the [place] FDA and has been authorized for detection and/or diagnosis of SARS-CoV-2 by FDA under an Emergency Use Authorization (EUA). This EUA will remain in effect (meaning this test can be used) for the duration of the COVID-19 declaration under Section 564(b)(1) of the Act, 21  U.S.C. section 360bbb-3(b)(1), unless the authorization is terminated or revoked.  Performed at [facility], [place] [address] [place],  [address]     Radiology Studies: DG Chest 1 View  Result Date: 09/01/2020 CLINICAL DATA:  Fall. EXAM: CHEST  1 VIEW COMPARISON:  07/31/2019 FINDINGS: Mild cardiomegaly. Mild aortic tortuosity. Post median sternotomy. Left-sided pacemaker in place. Mild vascular congestion without pulmonary edema. Subsegmental left lung base atelectasis. No confluent consolidation, pleural effusion or pneumothorax. No acute osseous abnormalities are seen. IMPRESSION: 1. Mild cardiomegaly with vascular congestion. 2. Subsegmental left lung base atelectasis. Electronically Signed   By: Keith Rake M.D.   On: 09/01/2020 20:11   CT Head Wo Contrast  Result Date: 09/01/2020 CLINICAL DATA:  Fall EXAM: CT HEAD WITHOUT CONTRAST CT CERVICAL SPINE WITHOUT CONTRAST  TECHNIQUE: Multidetector CT imaging of the head and cervical spine was performed following the standard protocol without intravenous contrast. Multiplanar CT image reconstructions of the cervical spine were also generated. COMPARISON:  None. FINDINGS: CT HEAD FINDINGS Brain: No evidence of acute infarction, hemorrhage, hydrocephalus, extra-axial collection or mass lesion/mass effect. Periventricular and deep white matter hypodensity. Mild global cerebral volume loss. Vascular: No hyperdense vessel or unexpected calcification. Skull: Normal. Negative for fracture or focal lesion. Sinuses/Orbits: No acute finding. Other: None. CT CERVICAL SPINE FINDINGS Alignment: Degenerative straightening and reversal of the normal cervical lordosis. Skull base and vertebrae: No acute fracture. No primary bone lesion or focal pathologic process. Soft tissues and spinal canal: No prevertebral fluid or swelling. No visible canal hematoma. Disc levels: Moderate multilevel disc space height loss and osteophytosis. Upper  chest: Negative. Other: None. IMPRESSION: 1. No acute intracranial pathology. Small-vessel white matter disease and mild global cerebral volume loss. 2. No fracture or static subluxation of the cervical spine. 3. Moderate multilevel disc space height loss and osteophytosis. Electronically Signed   By: Eddie Candle M.D.   On: 09/01/2020 21:27   CT Cervical Spine Wo Contrast  Result Date: 09/01/2020 CLINICAL DATA:  Fall EXAM: CT HEAD WITHOUT CONTRAST CT CERVICAL SPINE WITHOUT CONTRAST TECHNIQUE: Multidetector CT imaging of the head and cervical spine was performed following the standard protocol without intravenous contrast. Multiplanar CT image reconstructions of the cervical spine were also generated. COMPARISON:  None. FINDINGS: CT HEAD FINDINGS Brain: No evidence of acute infarction, hemorrhage, hydrocephalus, extra-axial collection or mass lesion/mass effect. Periventricular and deep white matter hypodensity. Mild global cerebral volume loss. Vascular: No hyperdense vessel or unexpected calcification. Skull: Normal. Negative for fracture or focal lesion. Sinuses/Orbits: No acute finding. Other: None. CT CERVICAL SPINE FINDINGS Alignment: Degenerative straightening and reversal of the normal cervical lordosis. Skull base and vertebrae: No acute fracture. No primary bone lesion or focal pathologic process. Soft tissues and spinal canal: No prevertebral fluid or swelling. No visible canal hematoma. Disc levels: Moderate multilevel disc space height loss and osteophytosis. Upper chest: Negative. Other: None. IMPRESSION: 1. No acute intracranial pathology. Small-vessel white matter disease and mild global cerebral volume loss. 2. No fracture or static subluxation of the cervical spine. 3. Moderate multilevel disc space height loss and osteophytosis. Electronically Signed   By: Eddie Candle M.D.   On: 09/01/2020 21:27   CT HIP RIGHT WO CONTRAST  Result Date: 09/02/2020 CLINICAL DATA:  Right hip fracture EXAM:  CT OF THE RIGHT HIP WITHOUT CONTRAST TECHNIQUE: Multidetector CT imaging of the right hip was performed according to the standard protocol. Multiplanar CT image reconstructions were also generated. COMPARISON:  X-ray 09/01/2020 FINDINGS: Bones/Joint/Cartilage Acute transcervical right femoral neck fracture with varus and anterior apex angulation. Distal fracture component is superiorly displaced. Fracture does not appear to involve the intertrochanteric region. No fracture extension into the femoral head. Hip joint is intact without dislocation. Moderate right hip osteoarthritis. Small right hip joint effusion. Bones appear diffusely demineralized. The visualized portion of the right hemipelvis appears intact. Ligaments Suboptimally assessed by CT. Muscles and Tendons Enthesopathic changes at the greater trochanter. No acute musculotendinous injury by CT. Soft tissues Soft tissue induration within the subcutaneous soft tissues overlying the lateral aspect of the greater trochanter. No organized hematoma. No inguinal lymphadenopathy. Scattered vascular calcifications. IMPRESSION: 1. Acute mildly displaced and angulated right femoral neck fracture. 2. Moderate right hip osteoarthritis. 3. Diffuse osseous demineralization. Electronically Signed   By: Davina Poke D.O.  On: 09/02/2020 10:42   DG Knee Complete 4 Views Right  Result Date: 09/02/2020 CLINICAL DATA:  History of recent falls, initial encounter EXAM: RIGHT KNEE - COMPLETE 4+ VIEW COMPARISON:  07/17/2012 FINDINGS: Meniscal calcifications are noted. Mild medial joint space narrowing is noted. No joint effusion is seen. No acute fracture or dislocation is noted. Mild patellofemoral degenerative changes are noted as well. IMPRESSION: Degenerative change without acute abnormality. Electronically Signed   By: Inez Catalina M.D.   On: 09/02/2020 09:17   ECHOCARDIOGRAM COMPLETE  Result Date: 09/03/2020    ECHOCARDIOGRAM REPORT   Patient Name:   [name] [name] Date of Exam: 09/02/2020 Medical Rec #:  [id number]     Height:       66.0 in Accession #:    [id number]    Weight:       129.2 lb Date of Birth:  19-Nov-1931     BSA:          1.661 m Patient Age:    5 years      BP:           172/70 mmHg Patient Gender: F             HR:           70 bpm. Exam Location:  Inpatient Procedure: 2D Echo, Cardiac Doppler and Color Doppler Indications:    I50.9* Heart failure (unspecified)  History:        Patient has no prior history of Echocardiogram examinations and                 Patient has prior history of Echocardiogram examinations, most                 recent 04/16/2017.  Sonographer:    Merrie Roof RDCS Referring Phys: [place]  1. Left ventricular ejection fraction, by estimation, is 35 to 40%. The left ventricle has moderately decreased function. The left ventricle demonstrates global hypokinesis. Left ventricular diastolic parameters are consistent with Grade II diastolic dysfunction (pseudonormalization).  2. Right ventricular systolic function is normal. The right ventricular size is normal. There is moderately elevated pulmonary artery systolic pressure. The estimated right ventricular systolic pressure is 36.6 mmHg.  3. Left atrial size was moderately dilated.  4. Right atrial size was moderately dilated.  5. The mitral valve is normal in structure. Mild mitral valve regurgitation. No evidence of mitral stenosis.  6. Tricuspid valve regurgitation is moderate to severe.  7. The aortic valve is normal in structure. There is moderate calcification of the aortic valve. There is mild thickening of the aortic valve. Aortic valve regurgitation is mild. Mild to moderate aortic valve sclerosis/calcification is present, without any evidence of aortic stenosis.  8. The inferior vena cava is dilated in size with <50% respiratory variability, suggesting right atrial pressure of 15 mmHg. FINDINGS  Left Ventricle: Left ventricular ejection fraction,  by estimation, is 35 to 40%. The left ventricle has moderately decreased function. The left ventricle demonstrates global hypokinesis. The left ventricular internal cavity size was normal in size. There is no left ventricular hypertrophy. Left ventricular diastolic parameters are consistent with Grade II diastolic dysfunction (pseudonormalization). Right Ventricle: The right ventricular size is normal. No increase in right ventricular wall thickness. Right ventricular systolic function is normal. There is moderately elevated pulmonary artery systolic pressure. The tricuspid regurgitant velocity is 3.77 m/s, and with an assumed right atrial pressure of 3 mmHg, the estimated right ventricular  systolic pressure is 86.7 mmHg. Left Atrium: Left atrial size was moderately dilated. Right Atrium: Right atrial size was moderately dilated. Pericardium: There is no evidence of pericardial effusion. Mitral Valve: The mitral valve is normal in structure. Mild mitral valve regurgitation. No evidence of mitral valve stenosis. Tricuspid Valve: The tricuspid valve is normal in structure. Tricuspid valve regurgitation is moderate to severe. No evidence of tricuspid stenosis. Aortic Valve: The aortic valve is normal in structure. There is moderate calcification of the aortic valve. There is mild thickening of the aortic valve. Aortic valve regurgitation is mild. Mild to moderate aortic valve sclerosis/calcification is present, without any evidence of aortic stenosis. Aortic valve mean gradient measures 3.0 mmHg. Aortic valve peak gradient measures 5.7 mmHg. Aortic valve area, by VTI measures 2.37 cm. Pulmonic Valve: The pulmonic valve was normal in structure. Pulmonic valve regurgitation is mild. No evidence of pulmonic stenosis. Aorta: The aortic root is normal in size and structure. Venous: The inferior vena cava is dilated in size with less than 50% respiratory variability, suggesting right atrial pressure of 15 mmHg. IAS/Shunts:  No atrial level shunt detected by color flow Doppler. Additional Comments: A device lead is visualized.  LEFT VENTRICLE PLAX 2D LVIDd:         5.04 cm LVIDs:         3.88 cm LV PW:         0.82 cm LV IVS:        0.94 cm LVOT diam:     2.00 cm LV SV:         46 LV SV Index:   28 LVOT Area:     3.14 cm  RIGHT VENTRICLE RV Basal diam:  3.91 cm RV S prime:     9.68 cm/s TAPSE (M-mode): 1.4 cm LEFT ATRIUM             Index       RIGHT ATRIUM           Index LA diam:        4.40 cm 2.65 cm/m  RA Area:     23.90 cm LA Vol (A2C):   66.4 ml 39.98 ml/m RA Volume:   77.30 ml  46.54 ml/m LA Vol (A4C):   83.8 ml 50.46 ml/m LA Biplane Vol: 77.4 ml 46.60 ml/m  AORTIC VALVE AV Area (Vmax):    2.40 cm AV Area (Vmean):   2.14 cm AV Area (VTI):     2.37 cm AV Vmax:           119.00 cm/s AV Vmean:          74.300 cm/s AV VTI:            0.195 m AV Peak Grad:      5.7 mmHg AV Mean Grad:      3.0 mmHg LVOT Vmax:         90.90 cm/s LVOT Vmean:        50.500 cm/s LVOT VTI:          0.147 m LVOT/AV VTI ratio: 0.75  AORTA Ao Root diam: 3.50 cm TRICUSPID VALVE TR Peak grad:   56.9 mmHg TR Vmax:        377.00 cm/s  SHUNTS Systemic VTI:  0.15 m Systemic Diam: 2.00 cm Candee Furbish MD Electronically signed by Candee Furbish MD Signature Date/Time: 09/03/2020/7:30:36 AM    Final    DG Hip Unilat  With Pelvis 2-3 Views Right  Result Date: 09/01/2020 CLINICAL  DATA:  Pain after fall. EXAM: DG HIP (WITH OR WITHOUT PELVIS) 2-3V RIGHT COMPARISON:  None. FINDINGS: Displaced right femoral neck fracture with proximal migration of the femoral shaft. The femoral head remains seated. Pubic rami are intact. Pubic symphysis and sacroiliac joints are congruent. Surgical clips in the pelvis. IMPRESSION: Displaced right femoral neck fracture. Electronically Signed   By: Keith Rake M.D.   On: 09/01/2020 20:10   Scheduled Meds: . acetaminophen  1,000 mg Oral Q8H  . escitalopram  20 mg Oral Daily  . feeding supplement  237 mL Oral BID BM  .  insulin aspart  0-5 Units Subcutaneous QHS  . insulin aspart  0-9 Units Subcutaneous TID WC  . lisinopril  2.5 mg Oral Daily  . melatonin  10 mg Oral QHS  . memantine  5 mg Oral BID  . polyethylene glycol  17 g Oral Daily  . pyridostigmine  60 mg Oral Daily   Continuous Infusions: . heparin 900 Units/hr (09/02/20 1807)  . lactated ringers 50 mL/hr at 09/02/20 0408     LOS: 2 days    Time spent: 35 mins    Giovonnie Trettel, MD [facility]   If 7PM-7AM, please contact night-coverage

## 2020-09-03 NOTE — Progress Notes (Signed)
[place] for Heparin while Eliquis on hold Indication: atrial fibrillation  Allergies  Allergen Reactions  . Aricept [Donepezil] Shortness Of Breath and Other (See Comments)    Hallucinations and loss of sensation in legs  . Nitroglycerin Other (See Comments)    Oral spray form causes rapid drop in blood pressure.   sensitive to tabs which causes rapid drop in blood pressure. Is ok to use oral spray form    Patient Measurements: Height: 5\' 6"  (167.6 cm) Weight: 58.6 kg (129 lb 3.2 oz) IBW/kg (Calculated) : 59.3 Heparin Dosing Weight: 58.6 kg  Vital Signs: Temp: 98.5 F (36.9 C) (05/25 0430) Temp Source: Oral (05/25 0430) BP: 149/74 (05/25 0430) Pulse Rate: 70 (05/25 0430)  Labs: Recent Labs    09/01/20 1951 09/02/20 0714 09/02/20 1711 09/02/20 1715 09/03/20 0211 09/03/20 1023  HGB 12.7 12.4  --  12.5 12.2  --   HCT 39.5 38.7  --  39.1 38.7  --   PLT 120* 111*  --  106* 99*  --   APTT  --   --  27  --  82* 66*  LABPROT 15.6*  --  14.6  --   --   --   INR 1.2  --  1.1  --   --   --   HEPARINUNFRC  --   --  0.60  --  0.85*  --   CREATININE 0.91 0.79  --   --  0.87  --     Estimated Creatinine Clearance: 42.9 mL/min (by C-G formula based on SCr of 0.87 mg/dL).  Medications:  Scheduled:  . acetaminophen  1,000 mg Oral Q8H  . escitalopram  20 mg Oral Daily  . feeding supplement  237 mL Oral BID BM  . insulin aspart  0-5 Units Subcutaneous QHS  . insulin aspart  0-9 Units Subcutaneous TID WC  . lisinopril  2.5 mg Oral Daily  . melatonin  10 mg Oral QHS  . memantine  5 mg Oral BID  . polyethylene glycol  17 g Oral Daily  . pyridostigmine  60 mg Oral Daily   Infusions:  . heparin 900 Units/hr (09/02/20 1807)  . lactated ringers 50 mL/hr at 09/02/20 0408   PRN: acetaminophen **FOLLOWED BY** [START ON 09/05/2020] acetaminophen, HYDROcodone-acetaminophen, morphine injection, oxyCODONE **OR** oxyCODONE,  senna-docusate  Assessment: 87 yo female presents after a fall at her facility sustaining a right hip fracture.  She takes eliquis chronically for afib.  Pharmacy is consulted to dose IV heparin while eliquis is on hold for surgery 5/26.  Last dose of Eliquis was 5/23 at 08:00  09/03/20  aPTT therapeutic but at low end of range  CBC: Hgb stable WNL; Plts low but appears consistent with previous values (~100k)  SCr stable WNL  RN reports no line issues and no bleeding   Ortho notes requesting heparin off at 0800 on 5/26 in prep for surgery  Goal of Therapy:   PTT 66-102 seconds Heparin level 0.3-0.7 units/ml Monitor platelets by anticoagulation protocol: Yes   Plan:   Will increase heparin slightly to 950 units/hr; to stop at 0800 tomorrow AM  Recheck confirmatory aPTT tomorrow AM  Daily CBC and heparin level  F/u ability to resume DOAC postop  Reuel Boom, PharmD, [place] 09/03/2020, 3:37 PM

## 2020-09-03 NOTE — Consult Note (Addendum)
Cardiology Consultation:   Patient ID: [name] [name] MRN: [id number]; DOB: 07-07-1931  Admit date: 09/01/2020 Date of Consult: 09/03/2020  PCP:  [name] Olp, MD   [facility] Providers Cardiologist:  [name] Axe, MD      Patient Profile:   [name] [name] is a 87 y.o. female with a PMH of CAD s/p CABG, chronic combined CHF/ICM, CHB s/p PPM-CRT, hypertension with more recent orthostatic hypotension, hyperlipidemia, chronic left bundle branch block, paroxysmal atrial fibrillation, COPD, and dementia who is being seen 09/03/2020 for the evaluation of preoperative assessment at the request of Dr. Dwyane [name].  History of Present Illness:   Ms. [name] [name] was in her usual state of health until 09/02/2018 when she was found on the ground at her assisted living facility.  This was an unwitnessed fall.  She complained of right hip and groin pain and was also noted to have a small skin tear on her right elbow.  Due to patient's underlying dementia she did not recall the events surrounding the fall.  She was transported to [facility] ED where she was found to have a right femoral neck fracture on x-ray.  CT head was negative.  She was admitted to medicine.  She was seen by orthopedics with plans for surgical repair of her hip fracture on Wednesday, 09/05/2018.  In anticipation of surgery for home Eliquis is on home and she is planning bridged with heparin gtt per pharmacy.  An echocardiogram was obtained 09/02/2020 showing EF 35 to 40% (down from 45 to 50% in 2019), G2 DD, global hypokinesis, normal RV size and function, moderately elevated PA pressures, moderate biatrial enlargement, mild MR, moderate to severe TR, and mild AI. Cardiology asked to evaluate for preoperative assessment.  He was last evaluated by cardiology via telemedicine visit with Dr. Caryl [name] 02/08/2019 at which time she had no significant cardiac complaints aside from dizziness related to her orthostatic hypotension.  She reported struggling  with feeling caged at home.  Since that time has moved into an assisted living facility.  She was again encouraged to use thigh-high compression stockings and he would consider addition of fludrocortisone if BP continues to be well.  Suspect her cardiology care was taken over by the in-house physician at her assisted living facility as she has not been seen since that time.  Prior to this admission echocardiogram in 2019 showed EF 45 to 50% with inferior hypokinesis, G3 DD, mildly reduced RV systolic function, mild to moderate AI, moderate MR, and severe LAE.  Her last ischemic evaluation was a left heart cath in 2014 which showed severe multivessel disease with atretic LIMA and patent SVG to D1, SVG to OM2, SVG to distal RCA.  At the time of this evaluation the patient is sitting upright in bed in no acute distress.  He is pleasantly demented and oriented to self only.  She does not recall that she is in the hospital or the events surrounding her admission.  She does report a headache at this time but otherwise is in no pain.  She denies any chest pain, though does report feeling short of breath.  She does not exhibit any labored breathing at this time nor does she appear short of breath with conversation.  She is otherwise unable to participate in history taking.  Spoke with her daughter [name] [name] (NP with PCCM), who tells me that her mother was able to ambulate independently, but slowly, without any anginal complaints.  She understands that her mother is likely higher  risk due to her history of CAD and CHF however, she feels that in order for her mother to have any quality of life they need to work towards getting her ambulatory again.  She states she has discussed this with her siblings and made the decision to make her mother DNR this admission.  They wish to proceed with surgery.  Past Medical History:  Diagnosis Date  . Atrial fibrillation ([place]) 10/06/2007   post op  . COPD (chronic obstructive  pulmonary disease) ([place])    Emphysema on 02/03/13 CXR  . CORONARY ARTERY DISEASE 10/06/2007   a. s/p PCI to LAD; b. s/p CABG; c. LHC 07/13/12: Proximal LAD stent patent, LIMA-LAD atretic, D1 occluded, proximal circumflex 30%, mid RCA occluded, SVG-D1 normal, SVG-OM2 normal, SVG-distal RCA normal, EF 40% with diffuse HK  . Dizziness   . History of colonic polyps 10/30/2009   No polyps in 2011. No repeat.    Marland Kitchen HYPERLIPIDEMIA 10/06/2007  . HYPERTENSION 10/06/2007  . LBBB 09/06/2008  . Left renal mass   . MELANOMA 09/06/2008   MOES PROCEDURE RIGHT  . MYOCARDIAL INFARCTION, HX OF 10/06/2007  . NICM (nonischemic cardiomyopathy) ([place])    Echocardiogram 07/12/12: EF 25-30%, diffuse HK, mild AI, mild MR, mild LAE  . PERSONAL HX COLONIC POLYPS 10/30/2009  . Presence of permanent cardiac pacemaker   . Syncope   . UTERINE CANCER, HX OF 09/06/2008  . VARICOSE VEIN 09/29/2009    Past Surgical History:  Procedure Laterality Date  . ABDOMINAL HYSTERECTOMY  1988  . BI-VENTRICULAR PACEMAKER INSERTION (CRT-P)  02/02/2013   St[employer] Jude, serial no. #[id number]   . CARDIAC CATHETERIZATION  08/30/2008  . CORONARY ANGIOPLASTY WITH STENT PLACEMENT  2009  . CORONARY ARTERY BYPASS GRAFT  2004  . IR RADIOLOGIST EVAL & MGMT  02/08/2017  . IR RADIOLOGIST EVAL & MGMT  05/26/2017  . IR RADIOLOGIST EVAL & MGMT  07/19/2017  . IR RADIOLOGIST EVAL & MGMT  05/04/2018  . LEFT HEART CATHETERIZATION WITH CORONARY ANGIOGRAM Bilateral 07/13/2012   Procedure: LEFT HEART CATHETERIZATION WITH CORONARY ANGIOGRAM;  Surgeon: [name] M Martinique, MD;  Location: [facility] CATH LAB;  Service: Cardiovascular;  Laterality: Bilateral;  . OOPHORECTOMY    . PERMANENT PACEMAKER INSERTION N/A 02/02/2013   Procedure: PERMANENT PACEMAKER INSERTION;  Surgeon: [name] Lance, MD;  Location: [facility] CATH LAB;  Service: Cardiovascular;  Laterality: N/A;  . RADIOLOGY WITH ANESTHESIA Left 03/28/2017   Procedure: RENAL CRYO ABLATION;  Surgeon: [name] Edouard, MD;  Location: [facility] ORS;   Service: Radiology;  Laterality: Left;  . right distal pretibeal     melanoma     Home Medications:  Prior to Admission medications   Medication Sig Start Date End Date Taking? Authorizing Provider  acetaminophen (TYLENOL) 500 MG tablet Take 1,000 mg by mouth every 8 (eight) hours as needed for fever, moderate pain or mild pain.   Yes [provider]  ELIQUIS 5 MG TABS tablet TAKE 1 TABLET(5 MG) BY MOUTH TWICE DAILY 09/25/19  Yes Deboraha Sprang, MD  escitalopram (LEXAPRO) 20 MG tablet TAKE 1 TABLET(20 MG) BY MOUTH DAILY 11/26/19  Yes [name] Olp, MD  haloperidol (HALDOL) 0.5 MG tablet Take 1 tablet (0.5 mg total) by mouth 2 (two) times daily as needed for agitation (separate by at least 6 hours). 07/02/19  Yes [name] Olp, MD  lisinopril (ZESTRIL) 2.5 MG tablet TAKE 1 TABLET(2.5 MG) BY MOUTH DAILY 11/26/19  Yes Sueanne Margarita, MD  Melatonin 10 MG CAPS  Take 10 mg by mouth at bedtime.   Yes [provider]  memantine (NAMENDA) 5 MG tablet Take 5 mg by mouth 2 (two) times daily. 08/22/20  Yes [provider]  pyridostigmine (MESTINON) 60 MG tablet TAKE 1 TABLET(60 MG) BY MOUTH DAILY 08/06/19  Yes [name] Olp, MD  traMADol (ULTRAM) 50 MG tablet Take 50 mg by mouth 2 (two) times daily as needed. 06/26/20  Yes [provider]    Inpatient Medications: Scheduled Meds: . acetaminophen  1,000 mg Oral Q8H  . escitalopram  20 mg Oral Daily  . feeding supplement  237 mL Oral BID BM  . insulin aspart  0-5 Units Subcutaneous QHS  . insulin aspart  0-9 Units Subcutaneous TID WC  . lisinopril  2.5 mg Oral Daily  . melatonin  10 mg Oral QHS  . memantine  5 mg Oral BID  . polyethylene glycol  17 g Oral Daily  . pyridostigmine  60 mg Oral Daily   Continuous Infusions: . heparin 900 Units/hr (09/02/20 1807)  . lactated ringers 50 mL/hr at 09/02/20 0408   PRN Meds: acetaminophen **FOLLOWED BY** [START ON 09/05/2020] acetaminophen,  HYDROcodone-acetaminophen, morphine injection, oxyCODONE **OR** oxyCODONE, senna-docusate  Allergies:    Allergies  Allergen Reactions  . Aricept [Donepezil] Shortness Of Breath and Other (See Comments)    Hallucinations and loss of sensation in legs  . Nitroglycerin Other (See Comments)    Oral spray [name] causes rapid drop in blood pressure.   sensitive to tabs which causes rapid drop in blood pressure. Is ok to use oral spray [name]    Social History:   Social History   Socioeconomic History  . Marital status: Widowed    Spouse name: Not on file  . Number of children: Not on file  . Years of education: Not on file  . Highest education level: Not on file  Occupational History  . Not on file  Tobacco Use  . Smoking status: Never Smoker  . Smokeless tobacco: Never Used  Vaping Use  . Vaping Use: Never used  Substance and Sexual Activity  . Alcohol use: Yes    Comment:  OCC gin or wine  . Drug use: No  . Sexual activity: Not on file  Other Topics Concern  . Not on file  Social History Narrative   Widowed. Lives alone with new cat as of 2018. 3 children. 6 grandkids   Daughter  (NP) and son in Sports coach (pharmacist-teaches at [employer]) that live close and help out.       Drives in the daytime, shops for herself, cleaning-has someone over to clean due to the intermittent lightheadedness, bathing      Retired from physical education.       Social Determinants of Health   Financial Resource Strain: Not on file  Food Insecurity: Not on file  Transportation Needs: Not on file  Physical Activity: Not on file  Stress: Not on file  Social Connections: Not on file  Intimate Partner Violence: Not on file    Family History:    Family History  Problem Relation Age of Onset  . Stroke Mother   . Heart attack Father   . Colon cancer Neg Hx      ROS:  Please see the history of present illness.   All other ROS reviewed and negative.     Physical Exam/Data:   Vitals:    09/02/20 1603 09/02/20 2015 09/03/20 0014 09/03/20 0430  BP: (!) 172/70 Marland Kitchen)  150/65 (!) 153/72 (!) 149/74  Pulse: 70 70 69 70  Resp: (!) 22  18   Temp: 99.7 F (37.6 C) 99.2 F (37.3 C) 97.7 F (36.5 C) 98.5 F (36.9 C)  TempSrc: Oral Oral Oral Oral  SpO2: (!) 89% 94% 100% 97%  Weight: 58.6 kg     Height: 5\' 6"  (1.676 m)       Intake/Output Summary (Last 24 hours) at 09/03/2020 1416 Last data filed at 09/03/2020 0815 Gross per 24 hour  Intake 2040.22 ml  Output --  Net 2040.22 ml   Last 3 Weights 09/02/2020 08/17/2019 07/31/2019  Weight (lbs) 129 lb 3.2 oz 144 lb 147 lb 11.3 oz  Weight (kg) 58.605 kg 65.318 kg 67 kg     Body mass index is 20.85 kg/m.  General:  Well nourished, well developed, in no acute distress HEENT: sclera anicteric Neck: +JVD Vascular: No carotid bruits; distal pulses 2+ bilaterally Cardiac:  normal S1, S2; RRR; no murmurs, rubs, or gallops appreciated Lungs:  clear to auscultation bilaterally, no wheezing, rhonchi or rales  Abd: soft, nontender, no hepatomegaly  Ext: no edema Musculoskeletal:  No deformities, BUE and BLE strength normal and equal Skin: warm and dry  Neuro:  Alert, oriented to self only, no focal abnormalities noted Psych:  Pleasantly demented  EKG:  The EKG was personally reviewed and demonstrates:  V-paced with rate 70 bpm Telemetry:  Telemetry was personally reviewed and demonstrates:  V-paced  Relevant CV Studies: Echocardiogram 09/02/20: 1. Left ventricular ejection fraction, by estimation, is 35 to 40%. The  left ventricle has moderately decreased function. The left ventricle  demonstrates global hypokinesis. Left ventricular diastolic parameters are  consistent with Grade II diastolic  dysfunction (pseudonormalization).  2. Right ventricular systolic function is normal. The right ventricular  size is normal. There is moderately elevated pulmonary artery systolic  pressure. The estimated right ventricular systolic pressure is  53.6 mmHg.  3. Left atrial size was moderately dilated.  4. Right atrial size was moderately dilated.  5. The mitral valve is normal in structure. Mild mitral valve  regurgitation. No evidence of mitral stenosis.  6. Tricuspid valve regurgitation is moderate to severe.  7. The aortic valve is normal in structure. There is moderate  calcification of the aortic valve. There is mild thickening of the aortic  valve. Aortic valve regurgitation is mild. Mild to moderate aortic valve  sclerosis/calcification is present, without  any evidence of aortic stenosis.  8. The inferior vena cava is dilated in size with <50% respiratory  variability, suggesting right atrial pressure of 15 mmHg.   [place] 2014: Coronary Arteries: Right dominant with no anomalies  LM: Normal  LAD: 30% proximal widely patent stent to proximal LAD distal vessel small but fills normally. [place] known to be atretic                          D1: occluded fills from SVG   Circumflex: 30% proximal              OM1:  normal             OM2: competitive flow from SVG  RCA: 100% mid vessel occlusion              PDA: fills from SVG             PLA: fills from SVG  Bypass Grafts:  LIMA: atretic  SVG: D1 normal  SVG OM2  Normal  SVG distal RCA normal  Ventriculography: EF: 40% %, diffuse hypokinesis  Hemodynamics:             Aortic Pressure: 147 74  mmHg             LV Pressure: 146 2  mmHg  Impression:  No evidence of new ischemic disease Angiogram no different than 2010  LBBB with echo showing moderately to severe decrease in EF Medical Rx is warranted.   Laboratory Data:  High Sensitivity Troponin:  No results for input(s): TROPONINIHS in the last 720 hours.   Chemistry Recent Labs  Lab 09/01/20 1951 09/02/20 0714 09/03/20 0211  NA 141 141 142  K 4.4 3.9 3.9  CL 109 107 105  CO2 24 26 29   GLUCOSE 124* 164* 120*  BUN 19 18 20   CREATININE 0.91 0.79 0.87  CALCIUM 8.7* 8.6* 8.9   GFRNONAA >60 >60 >60  ANIONGAP 8 8 8     Recent Labs  Lab 09/01/20 1951 09/03/20 0211  PROT 6.4* 6.0*  ALBUMIN 4.0 3.7  AST 21 34  ALT 11 23  ALKPHOS 56 57  BILITOT 0.8 1.3*   Hematology Recent Labs  Lab 09/02/20 0714 09/02/20 1715 09/03/20 0211  WBC 9.6 10.4 8.8  RBC 4.03 4.10 4.00  HGB 12.4 12.5 12.2  HCT 38.7 39.1 38.7  MCV 96.0 95.4 96.8  MCH 30.8 30.5 30.5  MCHC 32.0 32.0 31.5  RDW 14.6 14.7 14.6  PLT 111* 106* 99*   BNPNo results for input(s): BNP, PROBNP in the last 168 hours.  DDimer No results for input(s): DDIMER in the last 168 hours.   Radiology/Studies:  DG Chest 1 View  Result Date: 09/01/2020 CLINICAL DATA:  Fall. EXAM: CHEST  1 VIEW COMPARISON:  07/31/2019 FINDINGS: Mild cardiomegaly. Mild aortic tortuosity. Post median sternotomy. Left-sided pacemaker in place. Mild vascular congestion without pulmonary edema. Subsegmental left lung base atelectasis. No confluent consolidation, pleural effusion or pneumothorax. No acute osseous abnormalities are seen. IMPRESSION: 1. Mild cardiomegaly with vascular congestion. 2. Subsegmental left lung base atelectasis. Electronically Signed   By: Keith Rake M.D.   On: 09/01/2020 20:11   CT Head Wo Contrast  Result Date: 09/01/2020 CLINICAL DATA:  Fall EXAM: CT HEAD WITHOUT CONTRAST CT CERVICAL SPINE WITHOUT CONTRAST TECHNIQUE: Multidetector CT imaging of the head and cervical spine was performed following the standard protocol without intravenous contrast. Multiplanar CT image reconstructions of the cervical spine were also generated. COMPARISON:  None. FINDINGS: CT HEAD FINDINGS Brain: No evidence of acute infarction, hemorrhage, hydrocephalus, extra-axial collection or mass lesion/mass effect. Periventricular and deep white matter hypodensity. Mild global cerebral volume loss. Vascular: No hyperdense vessel or unexpected calcification. Skull: Normal. Negative for fracture or focal lesion. Sinuses/Orbits: No acute  finding. Other: None. CT CERVICAL SPINE FINDINGS Alignment: Degenerative straightening and reversal of the normal cervical lordosis. Skull base and vertebrae: No acute fracture. No primary bone lesion or focal pathologic process. Soft tissues and spinal canal: No prevertebral fluid or swelling. No visible canal hematoma. Disc levels: Moderate multilevel disc space height loss and osteophytosis. Upper chest: Negative. Other: None. IMPRESSION: 1. No acute intracranial pathology. Small-vessel white matter disease and mild global cerebral volume loss. 2. No fracture or static subluxation of the cervical spine. 3. Moderate multilevel disc space height loss and osteophytosis. Electronically Signed   By: Eddie Candle M.D.   On: 09/01/2020 21:27   CT Cervical Spine Wo Contrast  Result Date: 09/01/2020 CLINICAL DATA:  Fall EXAM:  CT HEAD WITHOUT CONTRAST CT CERVICAL SPINE WITHOUT CONTRAST TECHNIQUE: Multidetector CT imaging of the head and cervical spine was performed following the standard protocol without intravenous contrast. Multiplanar CT image reconstructions of the cervical spine were also generated. COMPARISON:  None. FINDINGS: CT HEAD FINDINGS Brain: No evidence of acute infarction, hemorrhage, hydrocephalus, extra-axial collection or mass lesion/mass effect. Periventricular and deep white matter hypodensity. Mild global cerebral volume loss. Vascular: No hyperdense vessel or unexpected calcification. Skull: Normal. Negative for fracture or focal lesion. Sinuses/Orbits: No acute finding. Other: None. CT CERVICAL SPINE FINDINGS Alignment: Degenerative straightening and reversal of the normal cervical lordosis. Skull base and vertebrae: No acute fracture. No primary bone lesion or focal pathologic process. Soft tissues and spinal canal: No prevertebral fluid or swelling. No visible canal hematoma. Disc levels: Moderate multilevel disc space height loss and osteophytosis. Upper chest: Negative. Other: None.  IMPRESSION: 1. No acute intracranial pathology. Small-vessel white matter disease and mild global cerebral volume loss. 2. No fracture or static subluxation of the cervical spine. 3. Moderate multilevel disc space height loss and osteophytosis. Electronically Signed   By: Eddie Candle M.D.   On: 09/01/2020 21:27   CT HIP RIGHT WO CONTRAST  Result Date: 09/02/2020 CLINICAL DATA:  Right hip fracture EXAM: CT OF THE RIGHT HIP WITHOUT CONTRAST TECHNIQUE: Multidetector CT imaging of the right hip was performed according to the standard protocol. Multiplanar CT image reconstructions were also generated. COMPARISON:  X-ray 09/01/2020 FINDINGS: Bones/Joint/Cartilage Acute transcervical right femoral neck fracture with varus and anterior apex angulation. Distal fracture component is superiorly displaced. Fracture does not appear to involve the intertrochanteric region. No fracture extension into the femoral head. Hip joint is intact without dislocation. Moderate right hip osteoarthritis. Small right hip joint effusion. Bones appear diffusely demineralized. The visualized portion of the right hemipelvis appears intact. Ligaments Suboptimally assessed by CT. Muscles and Tendons Enthesopathic changes at the greater trochanter. No acute musculotendinous injury by CT. Soft tissues Soft tissue induration within the subcutaneous soft tissues overlying the lateral aspect of the greater trochanter. No organized hematoma. No inguinal lymphadenopathy. Scattered vascular calcifications. IMPRESSION: 1. Acute mildly displaced and angulated right femoral neck fracture. 2. Moderate right hip osteoarthritis. 3. Diffuse osseous demineralization. Electronically Signed   By: Davina Poke D.O.   On: 09/02/2020 10:42   DG Knee Complete 4 Views Right  Result Date: 09/02/2020 CLINICAL DATA:  History of recent falls, initial encounter EXAM: RIGHT KNEE - COMPLETE 4+ VIEW COMPARISON:  07/17/2012 FINDINGS: Meniscal calcifications are noted.  Mild medial joint space narrowing is noted. No joint effusion is seen. No acute fracture or dislocation is noted. Mild patellofemoral degenerative changes are noted as well. IMPRESSION: Degenerative change without acute abnormality. Electronically Signed   By: Inez Catalina M.D.   On: 09/02/2020 09:17   ECHOCARDIOGRAM COMPLETE  Result Date: 09/03/2020    ECHOCARDIOGRAM REPORT   Patient Name:   NECIE WILCOXSON Date of Exam: 09/02/2020 Medical Rec #:  [id number]     Height:       66.0 in Accession #:    [id number]    Weight:       129.2 lb Date of Birth:  [birth year]     BSA:          1.661 m Patient Age:    40 years      BP:           172/70 mmHg Patient Gender: F  HR:           70 bpm. Exam Location:  Inpatient Procedure: 2D Echo, Cardiac Doppler and Color Doppler Indications:    I50.9* Heart failure (unspecified)  History:        Patient has no prior history of Echocardiogram examinations and                 Patient has prior history of Echocardiogram examinations, most                 recent 04/16/2017.  Sonographer:    Merrie Roof RDCS Referring Phys: [place]  1. Left ventricular ejection fraction, by estimation, is 35 to 40%. The left ventricle has moderately decreased function. The left ventricle demonstrates global hypokinesis. Left ventricular diastolic parameters are consistent with Grade II diastolic dysfunction (pseudonormalization).  2. Right ventricular systolic function is normal. The right ventricular size is normal. There is moderately elevated pulmonary artery systolic pressure. The estimated right ventricular systolic pressure is 86.5 mmHg.  3. Left atrial size was moderately dilated.  4. Right atrial size was moderately dilated.  5. The mitral valve is normal in structure. Mild mitral valve regurgitation. No evidence of mitral stenosis.  6. Tricuspid valve regurgitation is moderate to severe.  7. The aortic valve is normal in structure. There is moderate  calcification of the aortic valve. There is mild thickening of the aortic valve. Aortic valve regurgitation is mild. Mild to moderate aortic valve sclerosis/calcification is present, without any evidence of aortic stenosis.  8. The inferior vena cava is dilated in size with <50% respiratory variability, suggesting right atrial pressure of 15 mmHg. FINDINGS  Left Ventricle: Left ventricular ejection fraction, by estimation, is 35 to 40%. The left ventricle has moderately decreased function. The left ventricle demonstrates global hypokinesis. The left ventricular internal cavity size was normal in size. There is no left ventricular hypertrophy. Left ventricular diastolic parameters are consistent with Grade II diastolic dysfunction (pseudonormalization). Right Ventricle: The right ventricular size is normal. No increase in right ventricular wall thickness. Right ventricular systolic function is normal. There is moderately elevated pulmonary artery systolic pressure. The tricuspid regurgitant velocity is 3.77 m/s, and with an assumed right atrial pressure of 3 mmHg, the estimated right ventricular systolic pressure is 78.4 mmHg. Left Atrium: Left atrial size was moderately dilated. Right Atrium: Right atrial size was moderately dilated. Pericardium: There is no evidence of pericardial effusion. Mitral Valve: The mitral valve is normal in structure. Mild mitral valve regurgitation. No evidence of mitral valve stenosis. Tricuspid Valve: The tricuspid valve is normal in structure. Tricuspid valve regurgitation is moderate to severe. No evidence of tricuspid stenosis. Aortic Valve: The aortic valve is normal in structure. There is moderate calcification of the aortic valve. There is mild thickening of the aortic valve. Aortic valve regurgitation is mild. Mild to moderate aortic valve sclerosis/calcification is present, without any evidence of aortic stenosis. Aortic valve mean gradient measures 3.0 mmHg. Aortic valve peak  gradient measures 5.7 mmHg. Aortic valve area, by VTI measures 2.37 cm. Pulmonic Valve: The pulmonic valve was normal in structure. Pulmonic valve regurgitation is mild. No evidence of pulmonic stenosis. Aorta: The aortic root is normal in size and structure. Venous: The inferior vena cava is dilated in size with less than 50% respiratory variability, suggesting right atrial pressure of 15 mmHg. IAS/Shunts: No atrial level shunt detected by color flow Doppler. Additional Comments: A device lead is visualized.  LEFT VENTRICLE PLAX 2D  LVIDd:         5.04 cm LVIDs:         3.88 cm LV PW:         0.82 cm LV IVS:        0.94 cm LVOT diam:     2.00 cm LV SV:         46 LV SV Index:   28 LVOT Area:     3.14 cm  RIGHT VENTRICLE RV Basal diam:  3.91 cm RV S prime:     9.68 cm/s TAPSE (M-mode): 1.4 cm LEFT ATRIUM             Index       RIGHT ATRIUM           Index LA diam:        4.40 cm 2.65 cm/m  RA Area:     23.90 cm LA Vol (A2C):   66.4 ml 39.98 ml/m RA Volume:   77.30 ml  46.54 ml/m LA Vol (A4C):   83.8 ml 50.46 ml/m LA Biplane Vol: 77.4 ml 46.60 ml/m  AORTIC VALVE AV Area (Vmax):    2.40 cm AV Area (Vmean):   2.14 cm AV Area (VTI):     2.37 cm AV Vmax:           119.00 cm/s AV Vmean:          74.300 cm/s AV VTI:            0.195 m AV Peak Grad:      5.7 mmHg AV Mean Grad:      3.0 mmHg LVOT Vmax:         90.90 cm/s LVOT Vmean:        50.500 cm/s LVOT VTI:          0.147 m LVOT/AV VTI ratio: 0.75  AORTA Ao Root diam: 3.50 cm TRICUSPID VALVE TR Peak grad:   56.9 mmHg TR Vmax:        377.00 cm/s  SHUNTS Systemic VTI:  0.15 m Systemic Diam: 2.00 cm Candee Furbish MD Electronically signed by Candee Furbish MD Signature Date/Time: 09/03/2020/7:30:36 AM    Final    DG Hip Unilat  With Pelvis 2-3 Views Right  Result Date: 09/01/2020 CLINICAL DATA:  Pain after fall. EXAM: DG HIP (WITH OR WITHOUT PELVIS) 2-3V RIGHT COMPARISON:  None. FINDINGS: Displaced right femoral neck fracture with proximal migration of the femoral  shaft. The femoral head remains seated. Pubic rami are intact. Pubic symphysis and sacroiliac joints are congruent. Surgical clips in the pelvis. IMPRESSION: Displaced right femoral neck fracture. Electronically Signed   By: Keith Rake M.D.   On: 09/01/2020 20:10     Assessment and Plan:   1. Preop assessment: patient presented after an unwitnessed fall resulting in a right hip fracture.  Patient has fairly limited activity at baseline, but able to ambulate slowly and independently.  Showed ER 35 to 40% down from 45 to 50% in 2019.  Unclear acuity of this change by cardiology in 2020.  She does not describe chest pain at historian due to her underlying dementia.  Her last ischemic testing was a left heart cath in 2014 which showed severe native disease, atretic LIMA, and patent SVG to D1, SVG to OM2, SVG to distal RCA.  Discussed with patient's daughter, [name], who feels the patient's best chance at regaining quality of life is to undergo surgery for management of her hip fracture knowing that she  is higher risk. She has a revised cardiac risk index of 2 (ischemic heart disease and CHF history) with a 6.6% risk of adverse cardiac events in the perioperative setting - No further cardiac testing recommended prior to surgical repair of her hip fracture - Continue to monitor volume status closely in the perioperative setting - Anticipate restarting Eliquis for stroke ppx once cleared to do so Ortho  2. CAD s/p CABG: No clear anginal complaints.  Not on aspirin due to need for anticoagulation. -To monitor for change in symptoms perioperative setting  3. Chronic combined CHF: Echo this admission with drop in EF from 45 to 50% 2019 to 35 to 40% at this time.  Unclear acuity of this change.  In the past she has not tolerated GDMT due to orthostatic hypotension.  BP has been persistently elevated this admission. Chest x-ray suggested mild cardiomegaly with vascular congestion.  Her only complaint is  shortness of breath currently.  JVD does appear to be elevated. - We will give a dose of IV Lasix to optimize volume status prior to surgery - Continue to monitor strict I&O's and daily weights  4.  CHB s/p PPM-CRT: She has not had her device interrogated in several years though, on last check in 2019, she had an estimated 7 to 7.5 years left on her device function.  Telemetry this admission with V-paced rhythm. -Will interrogate device - Continue routine monitoring outpatient.  5. Paroxysmal atrial fibrillation: currently in V-paced rhythm.  Eliquis on hold in anticipation of surgery.  She has been started on a heparin gtt. given her CHA2DS2-VASc Score = 6 [CHF History: Yes, HTN History: Yes, Diabetes History: No, Stroke History: No, Vascular Disease History: Yes, Age Score: 2, Gender Score: 1]; annual risk of stroke is 9.7 %.    - Resume Eliquis when cleared to do so by Ortho  6. HTN with more recent orthostatic hypotensions: on pyridostigmine at baseline. BP has been persistently elevated.  - Continue home lisinopril and pyridostigmine    Risk Assessment/Risk Scores:   New York Heart Association (NYHA) Functional Class NYHA Class II  CHA2DS2-VASc Score = 6  This indicates a 9.7% annual risk of stroke. The patient's score is based upon: CHF History: Yes HTN History: Yes Diabetes History: No Stroke History: No Vascular Disease History: Yes Age Score: 2 Gender Score: 1       For questions or updates, please contact [place] Please consult [email or web address] for contact info under    Signed, Abigail Butts, PA-C  09/03/2020 2:16 PM   Patient seen and examined.  Agree with above documentation.  Ms. Heaps is a 87 year old female with a history of CAD status post CABG, complete heart block status post PPM-CRT-D, chronic combined systolic diastolic heart failure, orthostatic hypotension, paroxysmal atrial fibrillation, COPD, dementia who we are consulted by Dr. Dwyane [name] for  preoperative evaluation prior to hip surgery.  On 09/01/2020 she was found down at her assisted living facility after an unwitnessed fall.  She reported groin pain but given her dementia, could not give details about what happened.  She was sent to [facility] ED and x-ray showed femoral neck fracture.  Orthopedic surgery planning surgical repair on 5/27.  Her Eliquis has been held and she was started on heparin drip.  Previous echo in 2019 showed LVEF 45 to 50%.  Echo done yesterday shows LVEF 35 to 36%, grade 2 diastolic dysfunction, normal RV function, moderately elevated pulmonary pressures, moderate to severe TR.  EKG shows  V paced rhythm at 70 bpm.Telemetry shows V-paced rhythm at 70.  On exam, patient is oriented x1, regular rate and rhythm, no murmurs, faint crackles at R base, no LE edema, +JVD.  For preop evaluation prior to hip surgery, difficult to asses for anginal symptoms due to her dementia.  Functional capacity appears limited.  RCRI score 2 given CAD, CHF.  Considering this and her age/comorbidities, she is a high risk surgical candidate.  This was discussed with her daughter, and she recognizes the risk, but would like to proceed with surgery as feel it is the best chance at improving her quality of life.  No further cardiac work-up recommended prior to surgery.  She does appear mildly volume overloaded, will give dose of IV Lasix to optimize volume status prior to surgery.  Donato Heinz, MD

## 2020-09-03 NOTE — TOC Initial Note (Signed)
Transition of Care [facility]) - Initial/Assessment Note    Patient Details  Name: [name] [name] MRN: [id number] Date of Birth: 1931-04-29  Transition of Care [facility]) CM/SW Contact:    Ross Ludwig, LCSW Phone Number: 09/03/2020, 7:20 PM  Clinical Narrative:                  Patient is an 87 year old female from [facility] care [facility].  Patient is alert and oriented x1.  Patient had a fall and has a fractured hip.  Patient will need to have surgery before she is able to get therapy first.  Patient and family have a community Education officer, museum who assists patients and their families with resources. Her name is Hassan Rowan Planes/SW with [facility] [phone].  She stated that patient and family would like to be considered for [facility].  CSW spoke to [facility] they will have to review patient after she has surgery and has been seen by PT and OT.  CSW was given permission to begin bed search.  CSW to continue to follow patient's progress throughout discharge planning.  Expected Discharge Plan: [place] Barriers to Discharge: Continued Medical Work up   Patient Goals and CMS Choice Patient states their goals for this hospitalization and ongoing recovery are:: To go to SNF for short term rehab then return back to [facility] memory care ALF. CMS Medicare.gov Compare Post Acute Care list provided to:: Other (Comment Required) Choice offered to / list presented to :  Ship broker)  Expected Discharge Plan and Services Expected Discharge Plan: [place] In-house Referral: Clinical Social Work   Post Acute Care Choice: [place] Living arrangements for the past 2 months: [place] (Memory care)                                      Prior Living Arrangements/Services Living arrangements for the past 2 months: [place] (Memory care) Lives with:: Facility Resident Patient  language and need for interpreter reviewed:: Yes Do you feel safe going back to the place where you live?: No   Patient needs some rehab first before she is able to return back to the memory care ALF.  Need for Family Participation in Patient Care: Yes (Comment) Care giver support system in place?: Yes (comment)   Criminal Activity/Legal Involvement Pertinent to Current Situation/Hospitalization: No - Comment as needed  Activities of Daily Living Home Assistive Devices/Equipment: Other (Comment) (pt unable to answer this question) ADL Screening (condition at time of admission) Patient's cognitive ability adequate to safely complete daily activities?: No Is the patient deaf or have difficulty hearing?: Yes Does the patient have difficulty seeing, even when wearing glasses/contacts?: No Does the patient have difficulty concentrating, remembering, or making decisions?: Yes Patient able to express need for assistance with ADLs?: Yes Does the patient have difficulty dressing or bathing?: Yes Independently performs ADLs?: No Communication: Independent Dressing (OT): Needs assistance Is this a change from baseline?: Pre-admission baseline Grooming: Needs assistance Is this a change from baseline?: Pre-admission baseline Feeding: Independent Bathing: Needs assistance Is this a change from baseline?: Pre-admission baseline Toileting: Needs assistance Is this a change from baseline?: Pre-admission baseline In/Out Bed: Needs assistance Is this a change from baseline?: Pre-admission baseline Walks in Home: Needs assistance Is this a change from baseline?: Pre-admission baseline Does the patient have difficulty walking  or climbing stairs?: Yes Weakness of Legs: Both Weakness of Arms/Hands: Both  Permission Sought/Granted Permission sought to share information with : Facility Contact Representative,Family Supports Permission granted to share information with : Yes, Verbal Permission  Granted,Yes, Release of Information Signed  Share Information with NAME:  Form Daughter [phone]  Hoopingarner,karen Daughter   [phone], Hassan Rowan Planes/SW with [facility] [phone]  Permission granted to share info w AGENCY: SNF admissions        Emotional Assessment Appearance:: Appears stated age   Affect (typically observed): Accepting,Calm,Appropriate,Stable Orientation: : Oriented to Self Alcohol / Substance Use: Not Applicable Psych Involvement: No (comment)  Admission diagnosis:  Hip fracture ([place]) [S72.009A] Closed hip fracture, right, initial encounter ([place]) [S72.001A] Closed fracture of right femur, unspecified fracture morphology, initial encounter [facility]) [S72.91XA] Patient Active Problem List   Diagnosis Date Noted  . Closed fracture of right femur, unspecified fracture morphology, initial encounter ([place]) 09/02/2020  . Fracture of femoral neck, right ([place]) 09/01/2020  . Closed hip fracture, right, initial encounter ([place]) 09/01/2020  . Chronic obstructive pulmonary disease ([place]) 03/20/2019  . Major depressive disorder with single episode, in partial remission ([place]) 03/20/2019  . Arthritis of carpometacarpal [facility]) joint of left thumb 06/02/2017  . Current use of long term anticoagulation 06/02/2017  . HCAP (healthcare-associated pneumonia) 04/04/2017  . Traumatic perinephric hematoma of left kidney 03/29/2017  . Acute blood loss as cause of postoperative anemia 03/29/2017  . Left renal mass 01/27/2017  . Overactive bladder 07/09/2016  . Dizziness 05/07/2016  . Microscopic hematuria 09/02/2015  . CAD (coronary artery disease) 04/17/2014  . Allergic rhinitis 04/17/2014  . Dementia ([place]) 04/17/2014  . Hearing loss 04/17/2014  . Pacemaker -CRT-St. Jude 05/21/2013  . Atrioventricular block, complete ([place]) 03/08/2013  . Paroxysmal atrial fibrillation ([place]) 02/03/2013  . Asthma, intrinsic 02/03/2013  . Chronic combined systolic  and diastolic congestive heart failure ([place]) 10/10/2012  . Depression 09/19/2012  . Varicose veins 09/29/2009  . History of melanoma 09/06/2008  . LBBB (left bundle branch block) 09/06/2008  . History of uterine cancer 09/06/2008  . Hyperlipidemia 10/06/2007  . Essential hypertension 10/06/2007  . Ischemic cardiomyopathy  EF 30% cath 4/14 with stent 10/06/2007   PCP:  Marin Olp, MD Pharmacy:   [facility] [place], [place] - [place] AT [place] [address] Phone: [phone] Fax: [phone]  [facility] [place], [place] [address] [place] [address] Phone: [phone] Fax: [place], [place] [address] [place] [address] Phone: [phone] Fax: [phone]     Social Determinants of Health (SDOH) Interventions    Readmission Risk Interventions No flowsheet data found.

## 2020-09-03 NOTE — NC FL2 (Signed)
[place] LEVEL OF CARE SCREENING TOOL     IDENTIFICATION  Patient Name: [name] [name] Birthdate: November 13, 1931 Sex: female Admission Date (Current Location): 09/01/2020  [facility] and [place] Number:  Herbalist and Address:  [facility],  [place], [place]      Provider Number: [id number]  Attending Physician Name and Address:  Shawna Clamp, MD  Relative Name and Phone Number:   Form Daughter [phone]  [place],karen Daughter   [phone]    Current Level of Care: Hospital Recommended Level of Care: [place] Prior Approval Number:    Date Approved/Denied:   PASRR Number: [id number]  Discharge Plan: SNF    Current Diagnoses: Patient Active Problem List   Diagnosis Date Noted  . Closed fracture of right femur, unspecified fracture morphology, initial encounter ([place]) 09/02/2020  . Fracture of femoral neck, right ([place]) 09/01/2020  . Closed hip fracture, right, initial encounter ([place]) 09/01/2020  . Chronic obstructive pulmonary disease ([place]) 03/20/2019  . Major depressive disorder with single episode, in partial remission ([place]) 03/20/2019  . Arthritis of carpometacarpal [facility]) joint of left thumb 06/02/2017  . Current use of long term anticoagulation 06/02/2017  . HCAP (healthcare-associated pneumonia) 04/04/2017  . Traumatic perinephric hematoma of left kidney 03/29/2017  . Acute blood loss as cause of postoperative anemia 03/29/2017  . Left renal mass 01/27/2017  . Overactive bladder 07/09/2016  . Dizziness 05/07/2016  . Microscopic hematuria 09/02/2015  . CAD (coronary artery disease) 04/17/2014  . Allergic rhinitis 04/17/2014  . Dementia ([place]) 04/17/2014  . Hearing loss 04/17/2014  . Pacemaker -CRT-St. Jude 05/21/2013  . Atrioventricular block, complete ([place]) 03/08/2013  . Paroxysmal atrial fibrillation ([place]) 02/03/2013  . Asthma, intrinsic 02/03/2013  .  Chronic combined systolic and diastolic congestive heart failure ([place]) 10/10/2012  . Depression 09/19/2012  . Varicose veins 09/29/2009  . History of melanoma 09/06/2008  . LBBB (left bundle branch block) 09/06/2008  . History of uterine cancer 09/06/2008  . Hyperlipidemia 10/06/2007  . Essential hypertension 10/06/2007  . Ischemic cardiomyopathy  EF 30% cath 4/14 with stent 10/06/2007    Orientation RESPIRATION BLADDER Height & Weight     Self  O2 Incontinent Weight: 129 lb 3.2 oz (58.6 kg) Height:  5\' 6"  (167.6 cm)  BEHAVIORAL SYMPTOMS/MOOD NEUROLOGICAL BOWEL NUTRITION STATUS      Continent Diet  AMBULATORY STATUS COMMUNICATION OF NEEDS Skin   Limited Assist Verbally Skin abrasions (PRN dressing changes)                       Personal Care Assistance Level of Assistance  Bathing,Dressing,Feeding Bathing Assistance: Limited assistance Feeding assistance: Limited assistance Dressing Assistance: Limited assistance     Functional Limitations Info  Hearing,Speech,Sight Sight Info: Adequate Hearing Info: Adequate Speech Info: Adequate    SPECIAL CARE FACTORS FREQUENCY  PT (By licensed PT),OT (By licensed OT)     PT Frequency: Minimum 5x a week OT Frequency: Minimum 5x a week            Contractures Contractures Info: Not present    Additional Factors Info  Code Status,Allergies Code Status Info: DNR Allergies Info: Aricept, Nitroglycerin           Current Medications (09/03/2020):  This is the current hospital active medication list Current Facility-Administered Medications  Medication Dose Route Frequency Provider Last Rate Last Admin  . acetaminophen (TYLENOL) tablet 1,000 mg  1,000 mg Oral Q8H Powell, A Clint Lipps.,  MD   1,000 mg at 09/03/20 0559   Followed by  . [START ON 09/05/2020] acetaminophen (TYLENOL) tablet 650 mg  650 mg Oral Q6H PRN Elodia Florence., MD      . escitalopram [facility]) tablet 20 mg  20 mg Oral Daily Chotiner, Yevonne Aline,  MD   20 mg at 09/03/20 0831  . heparin ADULT infusion 100 units/mL (25000 units/24mL)  900 Units/hr Intravenous Continuous Emiliano Dyer, RPH 9 mL/hr at 09/02/20 1807 900 Units/hr at 09/02/20 1807  . HYDROcodone-acetaminophen (NORCO/VICODIN) 5-325 MG per tablet 1-2 tablet  1-2 tablet Oral Q6H PRN Chotiner, Yevonne Aline, MD   2 tablet at 09/02/20 1611  . insulin aspart (novoLOG) injection 0-5 Units  0-5 Units Subcutaneous QHS Elodia Florence., MD      . insulin aspart (novoLOG) injection 0-9 Units  0-9 Units Subcutaneous TID WC Elodia Florence., MD   2 Units at 09/02/20 1725  . lactated ringers infusion   Intravenous Continuous Chotiner, Yevonne Aline, MD 50 mL/hr at 09/02/20 0408 New Bag at 09/02/20 0408  . lisinopril (ZESTRIL) tablet 2.5 mg  2.5 mg Oral Daily Chotiner, Yevonne Aline, MD   2.5 mg at 09/03/20 4709  . melatonin tablet 10 mg  10 mg Oral QHS Elodia Florence., MD   10 mg at 09/02/20 2031  . memantine (NAMENDA) tablet 5 mg  5 mg Oral BID Elodia Florence., MD   5 mg at 09/03/20 0831  . morphine 2 MG/ML injection 0.5 mg  0.5 mg Intravenous Q3H PRN Elodia Florence., MD   0.5 mg at 09/02/20 1649  . oxyCODONE (Oxy IR/ROXICODONE) immediate release tablet 2.5 mg  2.5 mg Oral Q4H PRN Elodia Florence., MD       Or  . oxyCODONE (Oxy IR/ROXICODONE) immediate release tablet 5 mg  5 mg Oral Q4H PRN Elodia Florence., MD   5 mg at 09/03/20 1059  . polyethylene glycol (MIRALAX / GLYCOLAX) packet 17 g  17 g Oral Daily Elodia Florence., MD   17 g at 09/03/20 [phone]  . pyridostigmine (MESTINON) tablet 60 mg  60 mg Oral Daily Elodia Florence., MD   60 mg at 09/03/20 4734  . senna-docusate (Senokot-S) tablet 1 tablet  1 tablet Oral QHS PRN Chotiner, Yevonne Aline, MD         Discharge Medications: Please see discharge summary for a list of discharge medications.  Relevant Imaging Results:  Relevant Lab Results:   Additional Information SSN  [id number]  Ross Ludwig, LCSW

## 2020-09-03 NOTE — Progress Notes (Signed)
Initial Nutrition Assessment  DOCUMENTATION CODES:   Not applicable  INTERVENTION:  - will order Ensure Enlive BID, each supplement provides 350 kcal and 20 grams of protein. - will order Magic Cup with lunch meals, each supplement provides 290 kcal and 9 grams of protein.   NUTRITION DIAGNOSIS:   Increased nutrient needs related to acute illness as evidenced by estimated needs.  GOAL:   Patient will meet greater than or equal to 90% of their needs  MONITOR:   PO intake,Supplement acceptance,Labs,Weight trends  REASON FOR ASSESSMENT:   Malnutrition Screening Tool,Consult Hip fracture protocol  ASSESSMENT:   87 y.o. female with medical history of afib, HTN, CAD, COPD, and dementia. She presented to the ED after an unwitnessed fall at facility after which she complained for R hip and groin pain. Xray in the ED showed R femoral neck fx. CT head was negative.  Patient is noted to be a/o to self only. She is laying in bed with no family or visitors present. Patient does not respond to questions or does not answer appropriately for the most part, but does respond "yes" when asked if she is hungry. Lunch tray on bedside table. Patient responds "yes" when asked if she would like assistance with being able to eat meal. RN notified.   Flow sheet documentation indicates she ate 25% of breakfast this AM (180 kcal and 5 grams protein).   Weight yesterday was 129 lb and weight on 08/17/19 was 144 lb. This indicates 15 lb weight loss (10.4% body weight) in 1 year; not significant for time frame. No weights recorded between these two dates.  Per notes: - R femoral neck fx following an unwitnessed fall  - pending surgical intervention 5/26   Labs reviewed; CBG: 110 mg/dl.  Medications reviewed; sliding scale novolog, 10 mg melatonin/night, 17 g miralax/day. IVF; LR @ 50 ml/hr.    NUTRITION - FOCUSED PHYSICAL EXAM:  Flowsheet Row Most Recent Value  Orbital Region No depletion  Upper  Arm Region No depletion  Thoracic and Lumbar Region Unable to assess  Buccal Region No depletion  Temple Region No depletion  Clavicle Bone Region No depletion  Clavicle and Acromion Bone Region No depletion  Scapular Bone Region Unable to assess  Dorsal Hand No depletion  Patellar Region Unable to assess  Anterior Thigh Region Unable to assess  Posterior Calf Region Unable to assess  Edema (RD Assessment) Unable to assess  Hair Reviewed  Eyes Reviewed  Mouth Unable to assess  Skin Reviewed  Nails Unable to assess  [nail polish]       Diet Order:   Diet Order            Diet NPO time specified Except for: Sips with Meds  Diet effective midnight           Diet regular Room service appropriate? Yes; Fluid consistency: Thin  Diet effective now                 EDUCATION NEEDS:   Not appropriate for education at this time  Skin:  Skin Assessment: Skin Integrity Issues: Skin Integrity Issues:: Other (Comment) Other: R arm skin tear  Last BM:  5/23  Height:   Ht Readings from Last 1 Encounters:  09/02/20 5\' 6"  (1.676 m)    Weight:   Wt Readings from Last 1 Encounters:  09/02/20 58.6 kg    Estimated Nutritional Needs:  Kcal:  1400-1600 kcal Protein:  60-70 grams Fluid:  >/= 1.5 L/day  [name], MS, RD, LDN, CNSC Inpatient Clinical Dietitian RD pager # available in AMION  After hours/weekend pager # available in AMION  

## 2020-09-03 NOTE — Progress Notes (Signed)
Chart reviewed. Hold heparin gtt at 0800 on 5/26 for surgery. NPO after MN tonight.

## 2020-09-03 NOTE — Progress Notes (Signed)
[place] for Heparin while Eliquis on hold Indication: atrial fibrillation  Allergies  Allergen Reactions  . Aricept [Donepezil] Shortness Of Breath and Other (See Comments)    Hallucinations and loss of sensation in legs  . Nitroglycerin Other (See Comments)    Oral spray form causes rapid drop in blood pressure.   sensitive to tabs which causes rapid drop in blood pressure. Is ok to use oral spray form    Patient Measurements: Height: 5\' 6"  (167.6 cm) Weight: 58.6 kg (129 lb 3.2 oz) IBW/kg (Calculated) : 59.3 Heparin Dosing Weight: 58.6 kg  Vital Signs: Temp: 97.7 F (36.5 C) (05/25 0014) Temp Source: Oral (05/25 0014) BP: 153/72 (05/25 0014) Pulse Rate: 69 (05/25 0014)  Labs: Recent Labs    09/01/20 1951 09/02/20 0714 09/02/20 1711 09/02/20 1715 09/03/20 0211  HGB 12.7 12.4  --  12.5 12.2  HCT 39.5 38.7  --  39.1 38.7  PLT 120* 111*  --  106* 99*  APTT  --   --  27  --  82*  LABPROT 15.6*  --  14.6  --   --   INR 1.2  --  1.1  --   --   HEPARINUNFRC  --   --  0.60  --  0.85*  CREATININE 0.91 0.79  --   --  0.87    Estimated Creatinine Clearance: 42.9 mL/min (by C-G formula based on SCr of 0.87 mg/dL).   Medical History: Past Medical History:  Diagnosis Date  . Atrial fibrillation ([place]) 10/06/2007   post op  . COPD (chronic obstructive pulmonary disease) ([place])    Emphysema on 02/03/13 CXR  . CORONARY ARTERY DISEASE 10/06/2007   a. s/p PCI to LAD; b. s/p CABG; c. LHC 07/13/12: Proximal LAD stent patent, LIMA-LAD atretic, D1 occluded, proximal circumflex 30%, mid RCA occluded, SVG-D1 normal, SVG-OM2 normal, SVG-distal RCA normal, EF 40% with diffuse HK  . Dizziness   . History of colonic polyps 10/30/2009   No polyps in 2011. No repeat.    Marland Kitchen HYPERLIPIDEMIA 10/06/2007  . HYPERTENSION 10/06/2007  . LBBB 09/06/2008  . Left renal mass   . MELANOMA 09/06/2008   MOES PROCEDURE RIGHT  . MYOCARDIAL INFARCTION, HX OF 10/06/2007  .  NICM (nonischemic cardiomyopathy) ([place])    Echocardiogram 07/12/12: EF 25-30%, diffuse HK, mild AI, mild MR, mild LAE  . PERSONAL HX COLONIC POLYPS 10/30/2009  . Presence of permanent cardiac pacemaker   . Syncope   . UTERINE CANCER, HX OF 09/06/2008  . VARICOSE VEIN 09/29/2009    Medications:  Scheduled:  . acetaminophen  1,000 mg Oral Q8H  . escitalopram  20 mg Oral Daily  . insulin aspart  0-5 Units Subcutaneous QHS  . insulin aspart  0-9 Units Subcutaneous TID WC  . lisinopril  2.5 mg Oral Daily  . melatonin  10 mg Oral QHS  . memantine  5 mg Oral BID  . polyethylene glycol  17 g Oral Daily  . pyridostigmine  60 mg Oral Daily   Infusions:  . heparin 900 Units/hr (09/02/20 1807)  . lactated ringers 50 mL/hr at 09/02/20 0408   PRN: acetaminophen **FOLLOWED BY** [START ON 09/05/2020] acetaminophen, HYDROcodone-acetaminophen, morphine injection, oxyCODONE **OR** oxyCODONE, senna-docusate  Assessment: 87 yo female presents after a fall at her facility sustaining a right hip fracture.  She takes eliquis chronically for afib.  Pharmacy is consulted to dose IV heparin while eliquis is on hold for surgery 5/26.  Last  dose of Eliquis was 5/23 at 08:00  09/03/20 Heparin level 0.85, remains elevated as expected with recent eliquis use aPTT 82 (therapeutic) CBC: H/H wnl, Plts low 99 but appears consistent with previous values SCr 0.87 RN reports no line issues and no bleeding   Goal of Therapy:   PTT 66-102 seconds Heparin level 0.3-0.7 units/ml Monitor platelets by anticoagulation protocol: Yes   Plan:   Continue Heparin IV infusion 900 units/hr  Repeat aPTT in 8 hr to confirm therapeutic dose - once aPTT and HL begin to correlate, can monitor heparin using heparin levels only  Daily CBC  Follow up plans for holding heparin drip prior to surgery 5/26  Leone Haven, PharmD 09/03/2020,3:00 AM

## 2020-09-04 ENCOUNTER — Encounter (HOSPITAL_COMMUNITY): Payer: Self-pay | Admitting: Family Medicine

## 2020-09-04 ENCOUNTER — Encounter (HOSPITAL_COMMUNITY)
Admission: EM | Disposition: A | Discharge status: Skilled Nursing Facility | Payer: Self-pay | Source: Skilled Nursing Facility | Attending: Family Medicine

## 2020-09-04 ENCOUNTER — Inpatient Hospital Stay (HOSPITAL_COMMUNITY): Payer: Medicare Other

## 2020-09-04 ENCOUNTER — Inpatient Hospital Stay (HOSPITAL_COMMUNITY): Payer: Medicare Other | Admitting: Certified Registered Nurse Anesthetist

## 2020-09-04 DIAGNOSIS — S72001A Fracture of unspecified part of neck of right femur, initial encounter for closed fracture: Secondary | ICD-10-CM

## 2020-09-04 HISTORY — PX: TOTAL HIP ARTHROPLASTY: SHX124

## 2020-09-04 LAB — CBC
HCT: 37.5 % (ref 36.0–46.0)
Hemoglobin: 11.6 g/dL — ABNORMAL LOW (ref 12.0–15.0)
MCH: 30 pg (ref 26.0–34.0)
MCHC: 30.9 g/dL (ref 30.0–36.0)
MCV: 96.9 fL (ref 80.0–100.0)
Platelets: 88 10*3/uL — ABNORMAL LOW (ref 150–400)
RBC: 3.87 MIL/uL (ref 3.87–5.11)
RDW: 14.6 % (ref 11.5–15.5)
WBC: 8.2 10*3/uL (ref 4.0–10.5)
nRBC: 0 % (ref 0.0–0.2)

## 2020-09-04 LAB — SURGICAL PCR SCREEN
MRSA, PCR: NEGATIVE
Staphylococcus aureus: NEGATIVE

## 2020-09-04 LAB — BASIC METABOLIC PANEL
Anion gap: 9 (ref 5–15)
BUN: 20 mg/dL (ref 8–23)
CO2: 31 mmol/L (ref 22–32)
Calcium: 8.6 mg/dL — ABNORMAL LOW (ref 8.9–10.3)
Chloride: 101 mmol/L (ref 98–111)
Creatinine, Ser: 0.76 mg/dL (ref 0.44–1.00)
GFR, Estimated: >60 mL/min (ref >60)
Glucose, Bld: 112 mg/dL — ABNORMAL HIGH (ref 70–99)
Potassium: 3.3 mmol/L — ABNORMAL LOW (ref 3.5–5.1)
Sodium: 141 mmol/L (ref 135–145)

## 2020-09-04 LAB — GLUCOSE, CAPILLARY
Glucose-Capillary: 115 mg/dL — ABNORMAL HIGH (ref 70–99)
Glucose-Capillary: 118 mg/dL — ABNORMAL HIGH (ref 70–99)
Glucose-Capillary: 120 mg/dL — ABNORMAL HIGH (ref 70–99)
Glucose-Capillary: 136 mg/dL — ABNORMAL HIGH (ref 70–99)
Glucose-Capillary: 126 mg/dL — ABNORMAL HIGH (ref 70–99)
Glucose-Capillary: 154 mg/dL — ABNORMAL HIGH (ref 70–99)

## 2020-09-04 LAB — MAGNESIUM: Magnesium: 2 mg/dL (ref 1.7–2.4)

## 2020-09-04 LAB — APTT: aPTT: 55 seconds — ABNORMAL HIGH (ref 24–36)

## 2020-09-04 LAB — HEPARIN LEVEL (UNFRACTIONATED): Heparin Unfractionated: 0.32 IU/mL (ref 0.30–0.70)

## 2020-09-04 LAB — PHOSPHORUS: Phosphorus: 3.1 mg/dL (ref 2.5–4.6)

## 2020-09-04 SURGERY — ARTHROPLASTY, HIP, TOTAL,POSTERIOR APPROACH
Anesthesia: General | Site: Hip | Laterality: Right

## 2020-09-04 MED ORDER — ONDANSETRON HCL 4 MG/2ML IJ SOLN: 4.0000 mg | Freq: Four times a day (QID) | INTRAMUSCULAR | Status: DC | PRN

## 2020-09-04 MED ORDER — METHOCARBAMOL 1000 MG/10ML IJ SOLN
500.0000 mg | Freq: Four times a day (QID) | INTRAVENOUS | Status: DC | PRN
  Filled 2020-09-04: qty 5

## 2020-09-04 MED ORDER — APIXABAN 2.5 MG PO TABS
2.5000 mg | ORAL_TABLET | Freq: Two times a day (BID) | ORAL | Status: DC
  Administered 2020-09-05 – 2020-09-09 (×9): 2.5 mg via ORAL
  Filled 2020-09-04 (×9): qty 1

## 2020-09-04 MED ORDER — CHLORHEXIDINE GLUCONATE CLOTH 2 % EX PADS
6.0000 | MEDICATED_PAD | Freq: Every day | CUTANEOUS | Status: DC
  Administered 2020-09-04 – 2020-09-05 (×2): 6 via TOPICAL

## 2020-09-04 MED ORDER — ONDANSETRON HCL 4 MG/2ML IJ SOLN
INTRAMUSCULAR | Status: AC
  Filled 2020-09-04: qty 2

## 2020-09-04 MED ORDER — DEXAMETHASONE SODIUM PHOSPHATE 10 MG/ML IJ SOLN
INTRAMUSCULAR | Status: AC
  Filled 2020-09-04: qty 1

## 2020-09-04 MED ORDER — ROCURONIUM BROMIDE 10 MG/ML (PF) SYRINGE
PREFILLED_SYRINGE | INTRAVENOUS | Status: DC | PRN
  Administered 2020-09-04: 50 mg via INTRAVENOUS
  Administered 2020-09-04: 20 mg via INTRAVENOUS

## 2020-09-04 MED ORDER — POTASSIUM CHLORIDE CRYS ER 20 MEQ PO TBCR: 40.0000 meq | EXTENDED_RELEASE_TABLET | Freq: Once | ORAL | Status: DC

## 2020-09-04 MED ORDER — SODIUM CHLORIDE 0.9 % IR SOLN
Status: DC | PRN
  Administered 2020-09-04: 1000 mL

## 2020-09-04 MED ORDER — FENTANYL CITRATE (PF) 100 MCG/2ML IJ SOLN: 25.0000 ug | INTRAMUSCULAR | Status: DC | PRN

## 2020-09-04 MED ORDER — HYDROCODONE-ACETAMINOPHEN 7.5-325 MG PO TABS
1.0000 | ORAL_TABLET | ORAL | Status: DC | PRN
  Filled 2020-09-04: qty 1

## 2020-09-04 MED ORDER — METHOCARBAMOL 500 MG PO TABS: 500.0000 mg | ORAL_TABLET | Freq: Four times a day (QID) | ORAL | Status: DC | PRN

## 2020-09-04 MED ORDER — FENTANYL CITRATE (PF) 250 MCG/5ML IJ SOLN
INTRAMUSCULAR | Status: AC
  Filled 2020-09-04: qty 5

## 2020-09-04 MED ORDER — ROCURONIUM BROMIDE 10 MG/ML (PF) SYRINGE
PREFILLED_SYRINGE | INTRAVENOUS | Status: AC
  Filled 2020-09-04: qty 10

## 2020-09-04 MED ORDER — BUPIVACAINE-EPINEPHRINE (PF) 0.25% -1:200000 IJ SOLN
INTRAMUSCULAR | Status: AC
  Filled 2020-09-04: qty 30

## 2020-09-04 MED ORDER — PROPOFOL 10 MG/ML IV BOLUS
INTRAVENOUS | Status: DC | PRN
  Administered 2020-09-04: 90 mg via INTRAVENOUS

## 2020-09-04 MED ORDER — ONDANSETRON HCL 4 MG/2ML IJ SOLN
INTRAMUSCULAR | Status: DC | PRN
  Administered 2020-09-04: 4 mg via INTRAVENOUS

## 2020-09-04 MED ORDER — METOCLOPRAMIDE HCL 5 MG/ML IJ SOLN
5.0000 mg | Freq: Three times a day (TID) | INTRAMUSCULAR | Status: DC | PRN
Start: 2020-09-04 — End: 2020-09-09

## 2020-09-04 MED ORDER — LACTATED RINGERS IV SOLN: INTRAVENOUS | Status: DC

## 2020-09-04 MED ORDER — TRANEXAMIC ACID-NACL 1000-0.7 MG/100ML-% IV SOLN
1000.0000 mg | Freq: Once | INTRAVENOUS | Status: AC
  Administered 2020-09-04: 1000 mg via INTRAVENOUS
  Filled 2020-09-04: qty 100

## 2020-09-04 MED ORDER — SODIUM CHLORIDE (PF) 0.9 % IJ SOLN
INTRAMUSCULAR | Status: DC | PRN
  Administered 2020-09-04: 30 mL

## 2020-09-04 MED ORDER — CEFAZOLIN SODIUM-DEXTROSE 2-4 GM/100ML-% IV SOLN
2.0000 g | Freq: Four times a day (QID) | INTRAVENOUS | Status: AC
  Administered 2020-09-04 – 2020-09-05 (×2): 2 g via INTRAVENOUS
  Filled 2020-09-04 (×2): qty 100

## 2020-09-04 MED ORDER — OXYCODONE HCL 5 MG/5ML PO SOLN: 5.0000 mg | Freq: Once | ORAL | Status: DC | PRN

## 2020-09-04 MED ORDER — LIDOCAINE 2% (20 MG/ML) 5 ML SYRINGE
INTRAMUSCULAR | Status: DC | PRN
  Administered 2020-09-04: 60 mg via INTRAVENOUS

## 2020-09-04 MED ORDER — METOCLOPRAMIDE HCL 5 MG PO TABS: 5.0000 mg | ORAL_TABLET | Freq: Three times a day (TID) | ORAL | Status: DC | PRN

## 2020-09-04 MED ORDER — MENTHOL 3 MG MT LOZG: 1.0000 | LOZENGE | OROMUCOSAL | Status: DC | PRN

## 2020-09-04 MED ORDER — ACETAMINOPHEN 325 MG PO TABS
325.0000 mg | ORAL_TABLET | Freq: Four times a day (QID) | ORAL | Status: DC | PRN
  Administered 2020-09-08: 650 mg via ORAL
  Filled 2020-09-04: qty 2

## 2020-09-04 MED ORDER — PHENOL 1.4 % MT LIQD: 1.0000 | OROMUCOSAL | Status: DC | PRN

## 2020-09-04 MED ORDER — ACETAMINOPHEN 500 MG PO TABS
500.0000 mg | ORAL_TABLET | Freq: Four times a day (QID) | ORAL | Status: AC
  Administered 2020-09-04 – 2020-09-05 (×4): 500 mg via ORAL
  Filled 2020-09-04 (×4): qty 1

## 2020-09-04 MED ORDER — FENTANYL CITRATE (PF) 250 MCG/5ML IJ SOLN
INTRAMUSCULAR | Status: DC | PRN
  Administered 2020-09-04 (×4): 50 ug via INTRAVENOUS

## 2020-09-04 MED ORDER — BUPIVACAINE-EPINEPHRINE 0.25% -1:200000 IJ SOLN
INTRAMUSCULAR | Status: DC | PRN
  Administered 2020-09-04: 30 mL

## 2020-09-04 MED ORDER — HYDROCODONE-ACETAMINOPHEN 5-325 MG PO TABS: 1.0000 | ORAL_TABLET | ORAL | Status: DC | PRN

## 2020-09-04 MED ORDER — SUCCINYLCHOLINE CHLORIDE 200 MG/10ML IV SOSY
PREFILLED_SYRINGE | INTRAVENOUS | Status: AC
  Filled 2020-09-04: qty 10

## 2020-09-04 MED ORDER — OXYCODONE HCL 5 MG PO TABS
5.0000 mg | ORAL_TABLET | Freq: Once | ORAL | Status: DC | PRN
Start: 2020-09-04 — End: 2020-09-04

## 2020-09-04 MED ORDER — PROPOFOL 10 MG/ML IV BOLUS
INTRAVENOUS | Status: AC
  Filled 2020-09-04: qty 20

## 2020-09-04 MED ORDER — DOCUSATE SODIUM 100 MG PO CAPS
100.0000 mg | ORAL_CAPSULE | Freq: Two times a day (BID) | ORAL | Status: DC
  Administered 2020-09-04 – 2020-09-09 (×9): 100 mg via ORAL
  Filled 2020-09-04 (×9): qty 1

## 2020-09-04 MED ORDER — DEXAMETHASONE SODIUM PHOSPHATE 10 MG/ML IJ SOLN
INTRAMUSCULAR | Status: DC | PRN
  Administered 2020-09-04: 5 mg via INTRAVENOUS

## 2020-09-04 MED ORDER — KETOROLAC TROMETHAMINE 30 MG/ML IJ SOLN
INTRAMUSCULAR | Status: DC | PRN
  Administered 2020-09-04: 30 mg

## 2020-09-04 MED ORDER — SODIUM CHLORIDE (PF) 0.9 % IJ SOLN
INTRAMUSCULAR | Status: AC
  Filled 2020-09-04: qty 30

## 2020-09-04 MED ORDER — MORPHINE SULFATE (PF) 2 MG/ML IV SOLN: 0.5000 mg | INTRAVENOUS | Status: DC | PRN

## 2020-09-04 MED ORDER — KETOROLAC TROMETHAMINE 30 MG/ML IJ SOLN
INTRAMUSCULAR | Status: AC
  Filled 2020-09-04: qty 1

## 2020-09-04 MED ORDER — 0.9 % SODIUM CHLORIDE (POUR BTL) OPTIME
TOPICAL | Status: DC | PRN
  Administered 2020-09-04: 1000 mL

## 2020-09-04 MED ORDER — ONDANSETRON HCL 4 MG PO TABS: 4.0000 mg | ORAL_TABLET | Freq: Four times a day (QID) | ORAL | Status: DC | PRN

## 2020-09-04 MED ORDER — SUGAMMADEX SODIUM 200 MG/2ML IV SOLN
INTRAVENOUS | Status: DC | PRN
  Administered 2020-09-04: 117.2 mg via INTRAVENOUS

## 2020-09-04 MED ORDER — SENNA 8.6 MG PO TABS
1.0000 | ORAL_TABLET | Freq: Two times a day (BID) | ORAL | Status: DC
  Administered 2020-09-04 – 2020-09-09 (×9): 8.6 mg via ORAL
  Filled 2020-09-04 (×9): qty 1

## 2020-09-04 SURGICAL SUPPLY — 60 items
ADH SKN CLS APL DERMABOND .7 (GAUZE/BANDAGES/DRESSINGS) ×1
APL PRP STRL LF DISP 70% ISPRP (MISCELLANEOUS) ×1
BAG DECANTER FOR FLEXI CONT (MISCELLANEOUS) ×2 IMPLANT
BAG SPEC THK2 15X12 ZIP CLS (MISCELLANEOUS) ×1
BAG ZIPLOCK 12X15 (MISCELLANEOUS) ×2 IMPLANT
BLADE SURG SZ10 CARB STEEL (BLADE) ×4 IMPLANT
CHLORAPREP W/TINT 26 (MISCELLANEOUS) ×2 IMPLANT
COVER SURGICAL LIGHT HANDLE (MISCELLANEOUS) ×2 IMPLANT
COVER WAND RF STERILE (DRAPES) IMPLANT
DERMABOND ADVANCED (GAUZE/BANDAGES/DRESSINGS) ×1
DERMABOND ADVANCED .7 DNX12 (GAUZE/BANDAGES/DRESSINGS) ×2 IMPLANT
DRAPE ORTHO SPLIT 77X108 STRL (DRAPES) ×4
DRAPE POUCH INSTRU U-SHP 10X18 (DRAPES) ×2 IMPLANT
DRAPE SURG 17X11 SM STRL (DRAPES) ×2 IMPLANT
DRAPE SURG ORHT 6 SPLT 77X108 (DRAPES) ×2 IMPLANT
DRAPE U-SHAPE 47X51 STRL (DRAPES) ×2 IMPLANT
DRSG AQUACEL AG ADV 3.5X10 (GAUZE/BANDAGES/DRESSINGS) ×2 IMPLANT
ELECT BLADE TIP CTD 4 INCH (ELECTRODE) ×2 IMPLANT
ELECT REM PT RETURN 15FT ADLT (MISCELLANEOUS) ×2 IMPLANT
FACESHIELD WRAPAROUND (MASK) ×4 IMPLANT
FACESHIELD WRAPAROUND OR TEAM (MASK) ×2 IMPLANT
GLOVE SRG 8 PF TXTR STRL LF DI (GLOVE) ×1 IMPLANT
GLOVE SURG ENC MOIS LTX SZ8.5 (GLOVE) ×4 IMPLANT
GLOVE SURG ENC TEXT LTX SZ7.5 (GLOVE) ×4 IMPLANT
GLOVE SURG UNDER POLY LF SZ8 (GLOVE) ×2
GLOVE SURG UNDER POLY LF SZ8.5 (GLOVE) ×2 IMPLANT
GOWN SPEC L3 XXLG W/TWL (GOWN DISPOSABLE) ×2 IMPLANT
GOWN STRL REUS W/TWL XL LVL3 (GOWN DISPOSABLE) ×2 IMPLANT
HANDPIECE INTERPULSE COAX TIP (DISPOSABLE) ×2
HEAD FEM UNIPOLAR 48 OD (Hips) ×1 IMPLANT
HOOD PEEL AWAY FLYTE STAYCOOL (MISCELLANEOUS) ×4 IMPLANT
JET LAVAGE IRRISEPT WOUND (IRRIGATION / IRRIGATOR)
KIT TURNOVER KIT A (KITS) ×2 IMPLANT
LAVAGE JET IRRISEPT WOUND (IRRIGATION / IRRIGATOR) IMPLANT
MANIFOLD NEPTUNE II (INSTRUMENTS) ×2 IMPLANT
NDL SPNL 18GX3.5 QUINCKE PK (NEEDLE) ×1 IMPLANT
NEEDLE SPNL 18GX3.5 QUINCKE PK (NEEDLE) ×2 IMPLANT
NS IRRIG 1000ML POUR BTL (IV SOLUTION) ×2 IMPLANT
PENCIL SMOKE EVACUATOR (MISCELLANEOUS) IMPLANT
PROTECTOR NERVE ULNAR (MISCELLANEOUS) ×2 IMPLANT
SAW OSC TIP CART 19.5X105X1.3 (SAW) ×1 IMPLANT
SEALER BIPOLAR AQUA 6.0 (INSTRUMENTS) ×2 IMPLANT
SET HNDPC FAN SPRY TIP SCT (DISPOSABLE) ×1 IMPLANT
SPACER DEPUY (Hips) ×1 IMPLANT
STAPLER VISISTAT 35W (STAPLE) ×1 IMPLANT
STEM TRI LOC GRIPTION SZ 7 STD IMPLANT
SUCTION FRAZIER HANDLE 10FR (MISCELLANEOUS) ×2
SUCTION TUBE FRAZIER 10FR DISP (MISCELLANEOUS) ×1 IMPLANT
SUT ETHIBOND NAB CT1 #1 30IN (SUTURE) ×4 IMPLANT
SUT MNCRL AB 3-0 PS2 18 (SUTURE) ×2 IMPLANT
SUT MON AB 2-0 CT1 36 (SUTURE) ×4 IMPLANT
SUT VIC AB 1 CT1 36 (SUTURE) ×4 IMPLANT
SUT VIC AB 2-0 CT1 27 (SUTURE) ×2
SUT VIC AB 2-0 CT1 TAPERPNT 27 (SUTURE) ×1 IMPLANT
SUT VLOC 180 0 24IN GS25 (SUTURE) ×4 IMPLANT
SYR 50ML LL SCALE MARK (SYRINGE) ×2 IMPLANT
TOWEL OR 17X26 10 PK STRL BLUE (TOWEL DISPOSABLE) ×4 IMPLANT
TRAY FOLEY MTR SLVR 16FR STAT (SET/KITS/TRAYS/PACK) ×2 IMPLANT
TRI LOC GRIPTION SZ 7 STD ×2 IMPLANT
WATER STERILE IRR 1000ML POUR (IV SOLUTION) ×2 IMPLANT

## 2020-09-04 NOTE — Anesthesia Preprocedure Evaluation (Signed)
Anesthesia Evaluation  Patient identified by MRN, date of birth, ID band Patient awake    Reviewed: Allergy & Precautions, H&P , NPO status , Patient's Chart, lab work & pertinent test results  Airway Mallampati: II   Neck ROM: full    Dental   Pulmonary asthma , COPD,    breath sounds clear to auscultation       Cardiovascular hypertension, + CAD, + Past MI, + CABG and +CHF  + dysrhythmias Atrial Fibrillation + pacemaker  Rhythm:regular Rate:Normal     Neuro/Psych Depression Dementia    GI/Hepatic   Endo/Other    Renal/GU      Musculoskeletal  (+) Arthritis ,   Abdominal   Peds  Hematology PLTS 88   Anesthesia Other Findings   Reproductive/Obstetrics                             Anesthesia Physical Anesthesia Plan  ASA: III  Anesthesia Plan: General   Post-op Pain Management:    Induction: Intravenous  PONV Risk Score and Plan: 3 and Ondansetron, Dexamethasone and Treatment may vary due to age or medical condition  Airway Management Planned: Oral ETT  Additional Equipment:   Intra-op Plan:   Post-operative Plan: Extubation in OR  Informed Consent: I have reviewed the patients History and Physical, chart, labs and discussed the procedure including the risks, benefits and alternatives for the proposed anesthesia with the patient or authorized representative who has indicated his/her understanding and acceptance.     Dental advisory given  Plan Discussed with: CRNA, Anesthesiologist and Surgeon  Anesthesia Plan Comments:         Anesthesia Quick Evaluation

## 2020-09-04 NOTE — Interval H&P Note (Signed)
History and Physical Interval Note:  09/04/2020 2:57 PM  [name] [name]  has presented today for surgery, with the diagnosis of RIGHT HIP FRACTURE.  The various methods of treatment have been discussed with the patient and family. After consideration of risks, benefits and other options for treatment, the patient has consented to  Procedure(s): ANTERIOR TOTAL HIP ARTHROPLASTY (Right) as a surgical intervention.  The patient's history has been reviewed, patient examined, no change in status, stable for surgery.  I have reviewed the patient's chart and labs.  Questions were answered to the patient's and daughter's satisfaction.  The risks, benefits, and alternatives were discussed with the patient / daughter. There are risks associated with the surgery including, but not limited to, problems with anesthesia (death), infection, instability (giving out of the joint), dislocation, differences in leg length/angulation/rotation, fracture of bones, loosening or failure of implants, hematoma (blood accumulation) which may require surgical drainage, blood clots, pulmonary embolism, nerve injury (foot drop and lateral thigh numbness), and blood vessel injury. The patient/daughter understand these risks and elect to proceed.      Hilton Cork Emidio Warrell

## 2020-09-04 NOTE — Transfer of Care (Signed)
Immediate Anesthesia Transfer of Care Note  Patient: [name] [name]  Procedure(s) Performed: ANTERIOR TOTAL HIP ARTHROPLASTY (Right Hip)  Patient Location: PACU  Anesthesia Type:General  Level of Consciousness: drowsy and patient cooperative  Airway & Oxygen Therapy: Patient Spontanous Breathing and Patient connected to face mask oxygen  Post-op Assessment: Report given to RN and Post -op Vital signs reviewed and stable  Post vital signs: Reviewed and stable  Last Vitals:  Vitals Value Taken Time  BP 166/56 09/04/20 1701  Temp    Pulse 71 09/04/20 1704  Resp 25 09/04/20 1704  SpO2 100 % 09/04/20 1704  Vitals shown include unvalidated device data.  Last Pain:  Vitals:   09/04/20 1418  TempSrc: Oral  PainSc:          Complications: No complications documented.

## 2020-09-04 NOTE — Op Note (Signed)
OPERATIVE REPORT  SURGEON: Rod Can, MD   ASSISTANT: Cherlynn June, PA-C  PREOPERATIVE DIAGNOSIS: Displaced Right femoral neck fracture.   POSTOPERATIVE DIAGNOSIS: Displaced Right femoral neck fracture.   PROCEDURE: Right hip hemiarthroplasty, anterior approach.   IMPLANTS: DePuy Tri Lock stem, size 7, std offset, with a -3 mm spacer and a 48 mm monopolar head ball.  ANESTHESIA:  General  ANTIBIOTICS: 2g ancef.  ESTIMATED BLOOD LOSS:-150 mL    DRAINS: None.  COMPLICATIONS: None   CONDITION: PACU - hemodynamically stable.   BRIEF CLINICAL NOTE: [name] [name] is a 87 y.o. female with a displaced Right femoral neck fracture. The patient was admitted to the hospitalist service and underwent perioperative risk stratification and medical optimization. The risks, benefits, and alternatives to hemiarthroplasty were explained, and the patient elected to proceed.  PROCEDURE IN DETAIL: The patient was taken to the operating room and general anesthesia was induced on the hospital bed.  The patient was then positioned on the [place] table.  All bony prominences were well padded.  The hip was prepped and draped in the normal sterile surgical fashion.  A time-out was called verifying side and site of surgery. Antibiotics were given within 60 minutes of beginning the procedure.   Bikini incision was made, and the direct anterior approach to the hip was performed through the Hueter interval.  Lateral femoral circumflex vessels were treated with the Auqumantys. The anterior capsule was exposed and an inverted T capsulotomy was made.  Fracture hematoma was encountered and evacuated. The patient was found to have a comminuted Right subcapital femoral neck fracture.  I freshened the femoral neck cut with a saw.  I removed the femoral neck fragment.  A corkscrew was placed into the head and the head was removed.  This was passed to the back table and was measured. The pubofemoral ligament was  released subperiosteally to the lesser trochanter.   Acetabular exposure was achieved.  I examined the articular cartilage which was intact.  The labrum was intact. A 48 mm trial head was placed and found to have excellent fit.   I then gained femoral exposure taking care to protect the abductors and greater trochanter.  This was performed using standard external rotation, extension, and adduction.  The superior capsule was incised, taking care to stay lateral to the posterior border of the femoral neck. A cookie cutter was used to enter the femoral canal, and then the femoral canal finder was used to confirm location.  I then sequentially broached up to a size 7.  Calcar planer was used on the femoral neck remnant.  I paced a std neck and a 36 + 1.5 head ball. The hip was reduced.  Leg lengths were checked fluoroscopically.  The hip was dislocated and trial components were removed.  I placed the real stem followed by the real spacer and head ball.  A single reduction maneuver was performed and the hip was reduced.  Fluoroscopy was used to confirm component position and leg lengths.  At 90 degrees of external rotation and extension, the hip was stable to an anterior directed force.   The wound was copiously irrigated with Irrisept solution and normal saline using pule lavage.  Marcaine solution was injected into the periarticular soft tissue.  The wound was closed in layers using #1 Vicryl and V-Loc for the fascia, 2-0 Vicryl for the subcutaneous fat, 2-0 Monocryl for the deep dermal layer, 3-0 running Monocryl subcuticular stitch and glue for the skin.  Once the  glue was fully dried, an Aquacell Ag dressing was applied.  The patient was then awakened from anesthesia and transported to the recovery room in stable condition.  Sponge, needle, and instrument counts were correct at the end of the case x2.  The patient tolerated the procedure well and there were no known complications.  Please note that a  surgical assistant was a medical necessity for this procedure to perform it in a safe and expeditious manner. Assistant was necessary to provide appropriate retraction of vital neurovascular structures, to prevent femoral fracture, and to allow for anatomic placement of the prosthesis.

## 2020-09-04 NOTE — Anesthesia Procedure Notes (Signed)
Procedure Name: Intubation Date/Time: 09/04/2020 3:13 PM Performed by: Lollie Sails, CRNA Pre-anesthesia Checklist: Patient identified, Emergency Drugs available, Suction available, Patient being monitored and Timeout performed Patient Re-evaluated:Patient Re-evaluated prior to induction Oxygen Delivery Method: Circle system utilized Preoxygenation: Pre-oxygenation with 100% oxygen Induction Type: IV induction Ventilation: Mask ventilation without difficulty Laryngoscope Size: 3 and Glidescope Grade View: Grade I Tube type: Oral Tube size: 7.0 mm Number of attempts: 1 Airway Equipment and Method: Video-laryngoscopy Placement Confirmation: ETT inserted through vocal cords under direct vision,  positive ETCO2 and breath sounds checked- equal and bilateral Secured at: 22 cm Tube secured with: Tape Dental Injury: Teeth and Oropharynx as per pre-operative assessment  Difficulty Due To: Difficulty was anticipated, Difficult Airway- due to anterior larynx and Difficult Airway- due to limited oral opening

## 2020-09-04 NOTE — Progress Notes (Signed)
OT Cancellation Note  Patient Details Name: [name] [name] MRN: [id number] DOB: Jun 01, 1931   Cancelled Treatment:    Reason Eval/Treat Not Completed: Patient not medically ready, scheduled for hip surgery today. Please re-consult OT post op. Thank you.   Delbert Phenix OT OT pager: [place] 09/04/2020, 7:05 AM

## 2020-09-04 NOTE — Plan of Care (Signed)

## 2020-09-04 NOTE — Progress Notes (Signed)
PROGRESS NOTE    Ariyana Faw  OVZ:[id number] DOB: 1931/11/06 DOA: 09/01/2020 PCP: Marin Olp, MD   Brief Narrative: This 87 y.o.femalewith PMH significant forparoxysmal atrial fibrillation, hypertension, CAD, COPD, dementia who presents after an unwitnessed fall at facility. Patient was found down on the ground on her right side with a small skin tear of her right elbow. She complained of pain in her right hip and groin region. Patient does not remember the fall and is not sure of what happened. Patient cannot provide any history secondary to her dementia. No family or staff from facility are present. She is found to have a right femoral neck fracture on x-ray. CT of her head was negative. She is anticoagulated on Eliquis chronically for A. Fib.   She's been admitted for R hip fracture.   Ortho consulted and scheduled to have ORIF on 5/26.  Assessment & Plan:   Principal Problem:   Fracture of femoral neck, right (HCC) Active Problems:   Essential hypertension   Paroxysmal atrial fibrillation (HCC)   Pacemaker -CRT-St. Jude   Dementia (HCC)   Current use of long term anticoagulation   Chronic obstructive pulmonary disease (HCC)   Closed hip fracture, right, initial encounter ([place])   Closed fracture of right femur, unspecified fracture morphology, initial encounter ([place])   Displaced R Femoral Neck Fracture  Unwitnessed Fall. She presented with unwitnessed fall at [facility] care unit. CT C-spine/ head without acute intracranial pathology. No fracture or static subluxation of cervical spine.  Right knee x-ray without acute abnormality. R hip CT with acute mildly displaced and angulated R femoral neck fracture, moderate R hip osteoarthritis. Discussed with [name] (daughter), she's aware of her higher risk.  Given hx HF,  Echo completed showed reduced EF.  Prior to injury, she ambulated without cane/walker, was able to dress, needed some assistance with  toileting (lived at [facility]).   Holding eliquis at this time, while bridging with heparin Continue scheduled apap, oxy prn, morphine.  Miralax.  Continue PT post op Ortho c/s, appreciate assistance.    Scheduled for ORIF on 5/26.  Atrial Fibrillation  S/p Pacemaker Placement Chadsvasc at least 5 (age, F, HTN, CAD - echo shows EF 35 to 40%., borderline). Given high chadsvasc, will plan to bridge while holding eliquis - heparin per pharmacy  CAD Not currently on antiplatelets or statin.  HFpEF  Mildly Reduced EF Echo 2019 with EF 45-50%, grade III diastolic dysfunction Repeat echo shows reduced EF 35 to 40%. Appears euvolemic at this time. Cardio consulted, patient is cleared for OR with high risk.  Dementia Delirium precautions. Allow family to stay overnight to help avoid delirium. Continue home namenda, melatonin, lexapro.  Prolonged QTc Follow mag, caution with qt prolonging meds Hold home prn haldol. Continue lexapro for now   History of autonomic dysfunction? Continue pyridostigmine  Hypertension Continue lisinopril for now, BP slightly up,  suspect related to pain/agitation/discomfort Would hold lisinopril on day of surgery  Thrombocytopenia Chronic.  Continue to monitor   DVT prophylaxis: Heparin Code Status: DNR Family Communication: No family at bed side. Disposition Plan:   Status is: Inpatient  Remains inpatient appropriate because:Inpatient level of care appropriate due to severity of illness   Dispo: The patient is from: [facility].              Anticipated d/c is to: SNF              Patient currently is not medically  stable to d/c.   Difficult to place patient No   Consultants:   Ortho  Procedures: Scheduled ORIF 5/26. Antimicrobials:  Anti-infectives (From admission, onward)   Start     Dose/Rate Route Frequency Ordered Stop   09/04/20 1400  ceFAZolin (ANCEF) IVPB 2g/100 mL premix        2 g 200 mL/hr over  30 Minutes Intravenous On call to O.R. 09/03/20 1857 09/05/20 0559      Subjective: Patient was seen and examined at bedside.  She is lying comfortably in bed, she denies any pain, fall. She does not remember about the fall, overnight events noted.  Scheduled to have ORIF today  Objective: Vitals:   09/03/20 1537 09/03/20 2023 09/04/20 0524 09/04/20 1418  BP: (!) 146/66 (!) 155/68 (!) 160/76 (!) 159/68  Pulse: 70 69 69 70  Resp: (!) 24 18 18 20   Temp: 98.8 F (37.1 C) 98.4 F (36.9 C) 97.7 F (36.5 C) 99.6 F (37.6 C)  TempSrc: Oral Oral Oral Oral  SpO2: 91% 96% 94% 95%  Weight:      Height:        Intake/Output Summary (Last 24 hours) at 09/04/2020 1421 Last data filed at 09/04/2020 0600 Gross per 24 hour  Intake 1264.01 ml  Output 1100 ml  Net 164.01 ml   Filed Weights   09/02/20 1603  Weight: 58.6 kg    Examination:  General exam: Appears calm and comfortable , not in any acute distress. Respiratory system: Clear to auscultation. Respiratory effort normal. Cardiovascular system: S1 & S2 heard, RRR. No JVD, murmurs, rubs, gallops or clicks. No pedal edema. Gastrointestinal system: Abdomen is nondistended, soft and nontender. No organomegaly or masses felt. Normal bowel sounds heard. Central nervous system: Alert and oriented. No focal neurological deficits. Extremities: Symmetric 5 x 5 power.  Right hip tenderness+, Skin: No rashes, lesions or ulcers Psychiatry: Judgement and insight appear normal. Mood & affect appropriate.     Data Reviewed: I have personally reviewed following labs and imaging studies  CBC: Recent Labs  Lab 09/01/20 1951 09/02/20 0714 09/02/20 1715 09/03/20 0211 09/04/20 0334  WBC 9.1 9.6 10.4 8.8 8.2  NEUTROABS 7.6  --   --  6.6  --   HGB 12.7 12.4 12.5 12.2 11.6*  HCT 39.5 38.7 39.1 38.7 37.5  MCV 94.3 96.0 95.4 96.8 96.9  PLT 120* 111* 106* 99* 88*   Basic Metabolic Panel: Recent Labs  Lab 09/01/20 1951 09/02/20 0714  09/03/20 0211 09/04/20 0334  NA 141 141 142 141  K 4.4 3.9 3.9 3.3*  CL 109 107 105 101  CO2 24 26 29 31   GLUCOSE 124* 164* 120* 112*  BUN 19 18 20 20   CREATININE 0.91 0.79 0.87 0.76  CALCIUM 8.7* 8.6* 8.9 8.6*  MG  --   --  2.0 2.0  PHOS  --   --  4.0 3.1   GFR: Estimated Creatinine Clearance: 46.7 mL/min (by C-G formula based on SCr of 0.76 mg/dL). Liver Function Tests: Recent Labs  Lab 09/01/20 1951 09/03/20 0211  AST 21 34  ALT 11 23  ALKPHOS 56 57  BILITOT 0.8 1.3*  PROT 6.4* 6.0*  ALBUMIN 4.0 3.7   No results for input(s): LIPASE, AMYLASE in the last 168 hours. No results for input(s): AMMONIA in the last 168 hours. Coagulation Profile: Recent Labs  Lab 09/01/20 1951 09/02/20 1711  INR 1.2 1.1   Cardiac Enzymes: No results for input(s): CKTOTAL, CKMB, CKMBINDEX, TROPONINI in  the last 168 hours. BNP (last 3 results) No results for input(s): PROBNP in the last 8760 hours. HbA1C: Recent Labs    09/02/20 0714  HGBA1C 5.4   CBG: Recent Labs  Lab 09/03/20 1301 09/03/20 1639 09/03/20 2020 09/04/20 0740 09/04/20 1200  GLUCAP 122* 162* 141* 115* 118*   Lipid Profile: No results for input(s): CHOL, HDL, LDLCALC, TRIG, CHOLHDL, LDLDIRECT in the last 72 hours. Thyroid Function Tests: No results for input(s): TSH, T4TOTAL, FREET4, T3FREE, THYROIDAB in the last 72 hours. Anemia Panel: No results for input(s): VITAMINB12, FOLATE, FERRITIN, TIBC, IRON, RETICCTPCT in the last 72 hours. Sepsis Labs: No results for input(s): PROCALCITON, LATICACIDVEN in the last 168 hours.  Recent Results (from the past 240 hour(s))  Resp Panel by RT-PCR (Flu A&B, Covid) Nasopharyngeal Swab     Status: None   Collection Time: 09/01/20  8:28 PM   Specimen: Nasopharyngeal Swab; Nasopharyngeal(NP) swabs in vial transport medium  Result Value Ref Range Status   SARS Coronavirus 2 by RT PCR NEGATIVE NEGATIVE Final    Comment: (NOTE) SARS-CoV-2 target nucleic acids are NOT  DETECTED.  The SARS-CoV-2 RNA is generally detectable in upper respiratory specimens during the acute phase of infection. The lowest concentration of SARS-CoV-2 viral copies this assay can detect is 138 copies/mL. A negative result does not preclude SARS-Cov-2 infection and should not be used as the sole basis for treatment or other patient management decisions. A negative result may occur with  improper specimen collection/handling, submission of specimen other than nasopharyngeal swab, presence of viral mutation(s) within the areas targeted by this assay, and inadequate number of viral copies(<138 copies/mL). A negative result must be combined with clinical observations, patient history, and epidemiological information. The expected result is Negative.  Fact Sheet for Patients:  EntrepreneurPulse.com.au  Fact Sheet for Healthcare Providers:  IncredibleEmployment.be  This test is no t yet approved or cleared by the [place] FDA and  has been authorized for detection and/or diagnosis of SARS-CoV-2 by FDA under an Emergency Use Authorization (EUA). This EUA will remain  in effect (meaning this test can be used) for the duration of the COVID-19 declaration under Section 564(b)(1) of the Act, 21 U.S.C.section 360bbb-3(b)(1), unless the authorization is terminated  or revoked sooner.       Influenza A by PCR NEGATIVE NEGATIVE Final   Influenza B by PCR NEGATIVE NEGATIVE Final    Comment: (NOTE) The Xpert Xpress SARS-CoV-2/FLU/RSV plus assay is intended as an aid in the diagnosis of influenza from Nasopharyngeal swab specimens and should not be used as a sole basis for treatment. Nasal washings and aspirates are unacceptable for Xpert Xpress SARS-CoV-2/FLU/RSV testing.  Fact Sheet for Patients: EntrepreneurPulse.com.au  Fact Sheet for Healthcare Providers: IncredibleEmployment.be  This test is not yet  approved or cleared by the [place] FDA and has been authorized for detection and/or diagnosis of SARS-CoV-2 by FDA under an Emergency Use Authorization (EUA). This EUA will remain in effect (meaning this test can be used) for the duration of the COVID-19 declaration under Section 564(b)(1) of the Act, 21 U.S.C. section 360bbb-3(b)(1), unless the authorization is terminated or revoked.  Performed at [facility], [place] [address] [place], [place] [address]   Surgical pcr screen     Status: None   Collection Time: 09/04/20 12:10 AM   Specimen: Nasal Mucosa; Nasal Swab  Result Value Ref Range Status   MRSA, PCR NEGATIVE NEGATIVE Final   Staphylococcus aureus NEGATIVE NEGATIVE Final  Comment: (NOTE) The Xpert SA Assay (FDA approved for NASAL specimens in patients 34 years of age and older), is one component of a comprehensive surveillance program. It is not intended to diagnose infection nor to guide or monitor treatment. Performed at [facility], [place] [address] [place],  [address]     Radiology Studies: ECHOCARDIOGRAM COMPLETE  Result Date: 09/03/2020    ECHOCARDIOGRAM REPORT   Patient Name:   KAYLER BUCKHOLTZ Date of Exam: 09/02/2020 Medical Rec #:  [id number]     Height:       66.0 in Accession #:    [id number]    Weight:       129.2 lb Date of Birth:  03-Jul-1931     BSA:          1.661 m Patient Age:    79 years      BP:           172/70 mmHg Patient Gender: F             HR:           70 bpm. Exam Location:  Inpatient Procedure: 2D Echo, Cardiac Doppler and Color Doppler Indications:    I50.9* Heart failure (unspecified)  History:        Patient has no prior history of Echocardiogram examinations and                 Patient has prior history of Echocardiogram examinations, most                 recent 04/16/2017.  Sonographer:    Merrie Roof RDCS Referring Phys: [place]  1. Left ventricular ejection fraction,  by estimation, is 35 to 40%. The left ventricle has moderately decreased function. The left ventricle demonstrates global hypokinesis. Left ventricular diastolic parameters are consistent with Grade II diastolic dysfunction (pseudonormalization).  2. Right ventricular systolic function is normal. The right ventricular size is normal. There is moderately elevated pulmonary artery systolic pressure. The estimated right ventricular systolic pressure is 37.6 mmHg.  3. Left atrial size was moderately dilated.  4. Right atrial size was moderately dilated.  5. The mitral valve is normal in structure. Mild mitral valve regurgitation. No evidence of mitral stenosis.  6. Tricuspid valve regurgitation is moderate to severe.  7. The aortic valve is normal in structure. There is moderate calcification of the aortic valve. There is mild thickening of the aortic valve. Aortic valve regurgitation is mild. Mild to moderate aortic valve sclerosis/calcification is present, without any evidence of aortic stenosis.  8. The inferior vena cava is dilated in size with <50% respiratory variability, suggesting right atrial pressure of 15 mmHg. FINDINGS  Left Ventricle: Left ventricular ejection fraction, by estimation, is 35 to 40%. The left ventricle has moderately decreased function. The left ventricle demonstrates global hypokinesis. The left ventricular internal cavity size was normal in size. There is no left ventricular hypertrophy. Left ventricular diastolic parameters are consistent with Grade II diastolic dysfunction (pseudonormalization). Right Ventricle: The right ventricular size is normal. No increase in right ventricular wall thickness. Right ventricular systolic function is normal. There is moderately elevated pulmonary artery systolic pressure. The tricuspid regurgitant velocity is 3.77 m/s, and with an assumed right atrial pressure of 3 mmHg, the estimated right ventricular systolic pressure is 28.3 mmHg. Left Atrium: Left  atrial size was moderately dilated. Right Atrium: Right atrial size was moderately dilated. Pericardium: There is no evidence of pericardial effusion.  Mitral Valve: The mitral valve is normal in structure. Mild mitral valve regurgitation. No evidence of mitral valve stenosis. Tricuspid Valve: The tricuspid valve is normal in structure. Tricuspid valve regurgitation is moderate to severe. No evidence of tricuspid stenosis. Aortic Valve: The aortic valve is normal in structure. There is moderate calcification of the aortic valve. There is mild thickening of the aortic valve. Aortic valve regurgitation is mild. Mild to moderate aortic valve sclerosis/calcification is present, without any evidence of aortic stenosis. Aortic valve mean gradient measures 3.0 mmHg. Aortic valve peak gradient measures 5.7 mmHg. Aortic valve area, by VTI measures 2.37 cm. Pulmonic Valve: The pulmonic valve was normal in structure. Pulmonic valve regurgitation is mild. No evidence of pulmonic stenosis. Aorta: The aortic root is normal in size and structure. Venous: The inferior vena cava is dilated in size with less than 50% respiratory variability, suggesting right atrial pressure of 15 mmHg. IAS/Shunts: No atrial level shunt detected by color flow Doppler. Additional Comments: A device lead is visualized.  LEFT VENTRICLE PLAX 2D LVIDd:         5.04 cm LVIDs:         3.88 cm LV PW:         0.82 cm LV IVS:        0.94 cm LVOT diam:     2.00 cm LV SV:         46 LV SV Index:   28 LVOT Area:     3.14 cm  RIGHT VENTRICLE RV Basal diam:  3.91 cm RV S prime:     9.68 cm/s TAPSE (M-mode): 1.4 cm LEFT ATRIUM             Index       RIGHT ATRIUM           Index LA diam:        4.40 cm 2.65 cm/m  RA Area:     23.90 cm LA Vol (A2C):   66.4 ml 39.98 ml/m RA Volume:   77.30 ml  46.54 ml/m LA Vol (A4C):   83.8 ml 50.46 ml/m LA Biplane Vol: 77.4 ml 46.60 ml/m  AORTIC VALVE AV Area (Vmax):    2.40 cm AV Area (Vmean):   2.14 cm AV Area (VTI):      2.37 cm AV Vmax:           119.00 cm/s AV Vmean:          74.300 cm/s AV VTI:            0.195 m AV Peak Grad:      5.7 mmHg AV Mean Grad:      3.0 mmHg LVOT Vmax:         90.90 cm/s LVOT Vmean:        50.500 cm/s LVOT VTI:          0.147 m LVOT/AV VTI ratio: 0.75  AORTA Ao Root diam: 3.50 cm TRICUSPID VALVE TR Peak grad:   56.9 mmHg TR Vmax:        377.00 cm/s  SHUNTS Systemic VTI:  0.15 m Systemic Diam: 2.00 cm Candee Furbish MD Electronically signed by Candee Furbish MD Signature Date/Time: 09/03/2020/7:30:36 AM    Final    Scheduled Meds: . [MAR Hold] acetaminophen  1,000 mg Oral Q8H  . [MAR Hold] escitalopram  20 mg Oral Daily  . [MAR Hold] feeding supplement  237 mL Oral BID BM  . [MAR Hold] insulin aspart  0-5 Units Subcutaneous  QHS  . [MAR Hold] insulin aspart  0-9 Units Subcutaneous TID WC  . [MAR Hold] lisinopril  2.5 mg Oral Daily  . [MAR Hold] melatonin  10 mg Oral QHS  . [MAR Hold] memantine  5 mg Oral BID  . [MAR Hold] polyethylene glycol  17 g Oral Daily  . [MAR Hold] potassium chloride  40 mEq Oral Once  . [MAR Hold] pyridostigmine  60 mg Oral Daily   Continuous Infusions: .  ceFAZolin (ANCEF) IV    . lactated ringers 50 mL/hr at 09/04/20 1243     LOS: 3 days    Time spent: 25 mins    Kerem Gilmer, MD [facility]   If 7PM-7AM, please contact night-coverage

## 2020-09-04 NOTE — Anesthesia Postprocedure Evaluation (Signed)
Anesthesia Post Note  Patient: [name] [name]  Procedure(s) Performed: ANTERIOR TOTAL HIP ARTHROPLASTY (Right Hip)     Patient location during evaluation: PACU Anesthesia Type: General Level of consciousness: awake and alert Pain management: pain level controlled Vital Signs Assessment: post-procedure vital signs reviewed and stable Respiratory status: spontaneous breathing, nonlabored ventilation, respiratory function stable and patient connected to nasal cannula oxygen Cardiovascular status: blood pressure returned to baseline and stable Postop Assessment: no apparent nausea or vomiting Anesthetic complications: no   No complications documented.  Last Vitals:  Vitals:   09/04/20 1745 09/04/20 1806  BP: (!) 149/63 (!) 150/57  Pulse: 70 69  Resp: 18   Temp: 36.9 C 36.9 C  SpO2: 95% 95%    Last Pain:  Vitals:   09/04/20 1806  TempSrc: Oral  PainSc:                  [place]

## 2020-09-04 NOTE — Progress Notes (Signed)
[name] [name] for Heparin while Eliquis on hold Indication: atrial fibrillation  Allergies  Allergen Reactions  . Aricept [Donepezil] Shortness Of Breath and Other (See Comments)    Hallucinations and loss of sensation in legs  . Nitroglycerin Other (See Comments)    Oral spray form causes rapid drop in blood pressure.   sensitive to tabs which causes rapid drop in blood pressure. Is ok to use oral spray form    Patient Measurements: Height: 5\' 6"  (167.6 cm) Weight: 58.6 kg (129 lb 3.2 oz) IBW/kg (Calculated) : 59.3 Heparin Dosing Weight: 58.6 kg  Vital Signs: Temp: 98.4 F (36.9 C) (05/25 2023) Temp Source: Oral (05/25 2023) BP: 155/68 (05/25 2023) Pulse Rate: 69 (05/25 2023)  Labs: Recent Labs    09/01/20 1951 09/02/20 0714 09/02/20 0714 09/02/20 1711 09/02/20 1715 09/03/20 0211 09/03/20 1023 09/04/20 0334  HGB 12.7 12.4  --   --  12.5 12.2  --  11.6*  HCT 39.5 38.7  --   --  39.1 38.7  --  37.5  PLT 120* 111*  --   --  106* 99*  --  88*  APTT  --   --    < > 27  --  82* 66* 55*  LABPROT 15.6*  --   --  14.6  --   --   --   --   INR 1.2  --   --  1.1  --   --   --   --   HEPARINUNFRC  --   --   --  0.60  --  0.85*  --  0.32  CREATININE 0.91 0.79  --   --   --  0.87  --  0.76   < > = values in this interval not displayed.    Estimated Creatinine Clearance: 46.7 mL/min (by C-G formula based on SCr of 0.76 mg/dL).  Medications:  Scheduled:  . acetaminophen  1,000 mg Oral Q8H  . escitalopram  20 mg Oral Daily  . feeding supplement  237 mL Oral BID BM  . insulin aspart  0-5 Units Subcutaneous QHS  . insulin aspart  0-9 Units Subcutaneous TID WC  . lisinopril  2.5 mg Oral Daily  . melatonin  10 mg Oral QHS  . memantine  5 mg Oral BID  . polyethylene glycol  17 g Oral Daily  . pyridostigmine  60 mg Oral Daily   Infusions:  .  ceFAZolin (ANCEF) IV    . heparin 950 Units/hr (09/03/20 1657)  . lactated ringers 50 mL/hr at  09/03/20 1658   PRN: acetaminophen **FOLLOWED BY** [START ON 09/05/2020] acetaminophen, HYDROcodone-acetaminophen, morphine injection, oxyCODONE **OR** oxyCODONE, senna-docusate  Assessment: 87 yo female presents after a fall at her facility sustaining a right hip fracture.  She takes eliquis chronically for afib.  Pharmacy is consulted to dose IV heparin while eliquis is on hold for surgery 5/26.  Last dose of Eliquis was 5/23 at 08:00  09/04/20  Heparin level = 0.32 and aPTT = 55 sec with heparin gtt @ 950 units/hr  CBC: Hgb 11.6; Plts low 88K, but appears consistent with previous values (~100k)  SCr stable WNL  No line issues and no bleeding noted  Ortho notes requesting heparin off at 0800 on 5/26 in prep for surgery  Goal of Therapy:   Heparin level 0.3-0.7 units/ml Monitor platelets by anticoagulation protocol: Yes   Plan:   Continue heparin gtt @ 950 units/hr; to stop at  0800 today   Daily CBC and heparin level  F/u ability to resume DOAC postop  Leone Haven, PharmD 09/04/2020, 5:10 AM

## 2020-09-05 ENCOUNTER — Encounter (HOSPITAL_COMMUNITY): Payer: Self-pay | Admitting: Orthopedic Surgery

## 2020-09-05 LAB — BASIC METABOLIC PANEL
Anion gap: 10 (ref 5–15)
BUN: 27 mg/dL — ABNORMAL HIGH (ref 8–23)
CO2: 26 mmol/L (ref 22–32)
Calcium: 8.1 mg/dL — ABNORMAL LOW (ref 8.9–10.3)
Chloride: 104 mmol/L (ref 98–111)
Creatinine, Ser: 0.75 mg/dL (ref 0.44–1.00)
GFR, Estimated: >60 mL/min (ref >60)
Glucose, Bld: 147 mg/dL — ABNORMAL HIGH (ref 70–99)
Potassium: 3.7 mmol/L (ref 3.5–5.1)
Sodium: 140 mmol/L (ref 135–145)

## 2020-09-05 LAB — CBC
HCT: 31.6 % — ABNORMAL LOW (ref 36.0–46.0)
Hemoglobin: 9.9 g/dL — ABNORMAL LOW (ref 12.0–15.0)
MCH: 30.2 pg (ref 26.0–34.0)
MCHC: 31.3 g/dL (ref 30.0–36.0)
MCV: 96.3 fL (ref 80.0–100.0)
Platelets: 85 10*3/uL — ABNORMAL LOW (ref 150–400)
RBC: 3.28 MIL/uL — ABNORMAL LOW (ref 3.87–5.11)
RDW: 14.3 % (ref 11.5–15.5)
WBC: 8.7 10*3/uL (ref 4.0–10.5)
nRBC: 0 % (ref 0.0–0.2)

## 2020-09-05 LAB — GLUCOSE, CAPILLARY
Glucose-Capillary: 118 mg/dL — ABNORMAL HIGH (ref 70–99)
Glucose-Capillary: 125 mg/dL — ABNORMAL HIGH (ref 70–99)
Glucose-Capillary: 122 mg/dL — ABNORMAL HIGH (ref 70–99)
Glucose-Capillary: 118 mg/dL — ABNORMAL HIGH (ref 70–99)

## 2020-09-05 MED ORDER — HYDROCODONE-ACETAMINOPHEN 7.5-325 MG PO TABS: 1.0000 | ORAL_TABLET | ORAL | 0 refills | Status: AC | PRN

## 2020-09-05 NOTE — Care Management Important Message (Signed)
Important Message  Patient Details IM Letter given to the Patient. Name: [name] [name] MRN: [id number] Date of Birth: Jan 19, 1932   Medicare Important Message Given:  Yes     Kerin Salen 09/05/2020, 1:42 PM

## 2020-09-05 NOTE — Plan of Care (Signed)
  Problem: Clinical Measurements: Goal: Will remain free from infection Outcome: Progressing Goal: Diagnostic test results will improve Outcome: Progressing   

## 2020-09-05 NOTE — Evaluation (Signed)
Occupational Therapy Evaluation Patient Details Name: [name] [name] MRN: [id number] DOB: 07/12/1931 Today's Date: 09/05/2020    History of Present Illness Patient is an 87 year old female s/p R hip hemiarthroplasty anterior approach after fall at facility. PMH includes paroxysmal atrial fibrillation, hypertension, CAD, COPD, dementia   Clinical Impression   Patient is poor historian due to hx of dementia, per chart review resides in memory care unit. Patient states not using walker or cane for ambulation. Currently patient needing mod A for bed mobility, mod A stand pivot transfer to recliner with walker and max to total A for LB ADLs. Patient needing max cues for body mechanics, sequencing and safety with narrow base of support trying to take few steps to recliner. Recommend continued acute OT services to maximize patient balance, activity tolerance in order to reduce caregiver burden and facilitate D/C to venue listed below.    Follow Up Recommendations  SNF    Equipment Recommendations  None recommended by OT       Precautions / Restrictions Precautions Precautions: Fall Restrictions Weight Bearing Restrictions: Yes RLE Weight Bearing: Weight bearing as tolerated      Mobility Bed Mobility Overal bed mobility: Needs Assistance Bed Mobility: Supine to Sit     Supine to sit: Mod assist;HOB elevated     General bed mobility comments: able to cue patient to bridge hips towards edge of bed, mod A for R LE and trunk management    Transfers Overall transfer level: Needs assistance Equipment used: Rolling walker (2 wheeled) Transfers: Sit to/from [facility] Sit to Stand: Mod assist;From elevated surface Stand pivot transfers: Mod assist       General transfer comment: poor safety awareness, mod A to power up to standing, max cues for sequencing and body mechanics. poor eccentric control into chair    Balance Overall balance assessment: Needs  assistance;History of Falls Sitting-balance support: Feet supported Sitting balance-Leahy Scale: Fair     Standing balance support: Bilateral upper extremity supported Standing balance-Leahy Scale: Poor                             ADL either performed or assessed with clinical judgement   ADL Overall ADL's : Needs assistance/impaired Eating/Feeding: Set up;Sitting   Grooming: Set up;Oral care;Sitting   Upper Body Bathing: Supervision/ safety;Set up;Sitting   Lower Body Bathing: Maximal assistance;Sitting/lateral leans;Sit to/from stand   Upper Body Dressing : Set up;Sitting   Lower Body Dressing: Maximal assistance;Sitting/lateral leans;Sit to/from stand   Toilet Transfer: Moderate assistance;Stand-pivot;Cueing for safety;Cueing for sequencing;RW Toilet Transfer Details (indicate cue type and reason): requires mod A to power up to standing and safely take few steps towards recliner, limited eccentric control into chair. max cues for body mechanics Toileting- Clothing Manipulation and Hygiene: Sit to/from stand;Sitting/lateral lean;Total assistance       Functional mobility during ADLs: Moderate assistance;Cueing for safety;Cueing for sequencing;Rolling walker                    Pertinent Vitals/Pain Pain Assessment: Faces Faces Pain Scale: Hurts even more Pain Location: R hip Pain Descriptors / Indicators: Grimacing;Guarding Pain Intervention(s): Monitored during session     Hand Dominance  (did not specify)   Extremity/Trunk Assessment Upper Extremity Assessment Upper Extremity Assessment: Generalized weakness   Lower Extremity Assessment Lower Extremity Assessment: Defer to PT evaluation       Communication Communication Communication: HOH   Cognition Arousal/Alertness: Awake/alert Behavior  During Therapy: WFL for tasks assessed/performed Overall Cognitive Status: History of cognitive impairments - at baseline                                                 Home Living Family/patient expects to be discharged to:: Skilled nursing facility                                        Prior Functioning/Environment          Comments: unsure of patient's baseline due to hx of dementia, per chart review resides in memory care unit. patient denies use of walker/cane for ambulation        OT Problem List: Decreased activity tolerance;Impaired balance (sitting and/or standing);Decreased safety awareness;Pain      OT Treatment/Interventions: Self-care/ADL training;Balance training;Patient/family education;Therapeutic activities    OT Goals(Current goals can be found in the care plan section) Acute Rehab OT Goals Patient Stated Goal: "it hurts" OT Goal Formulation: With patient Time For Goal Achievement: 09/19/20 Potential to Achieve Goals: Fair  OT Frequency: Min 2X/week    AM-PAC OT "6 Clicks" Daily Activity     Outcome Measure Help from another person eating meals?: A Little Help from another person taking care of personal grooming?: A Little Help from another person toileting, which includes using toliet, bedpan, or urinal?: A Lot Help from another person bathing (including washing, rinsing, drying)?: A Lot Help from another person to put on and taking off regular upper body clothing?: A Little Help from another person to put on and taking off regular lower body clothing?: A Lot 6 Click Score: 15   End of Session Equipment Utilized During Treatment: Rolling walker;Gait belt Nurse Communication: Mobility status  Activity Tolerance: Patient tolerated treatment well Patient left: in chair;with call bell/phone within reach;with chair alarm set  OT Visit Diagnosis: Pain;History of falling (Z91.81) Pain - Right/Left: Right Pain - part of body: Hip                Time: 8546-2703 OT Time Calculation (min): 23 min Charges:  OT General Charges $OT Visit: 1 Visit OT Evaluation $OT Eval  Low Complexity: 1 Low OT Treatments $Self Care/Home Management : 8-22 mins  Delbert Phenix OT OT pager: [place] 09/05/2020, 1:15 PM

## 2020-09-05 NOTE — Progress Notes (Signed)
    Subjective:  Patient reports pain as mild to moderate.  Denies N/V/CP/SOB. Patient non-verbal but able to follow commands  Objective:   VITALS:   Vitals:   09/04/20 1806 09/04/20 1900 09/05/20 0606 09/05/20 1333  BP: (!) 150/57 (!) 139/58 (!) 154/63 (!) 106/40  Pulse: 69 70 70 70  Resp:  (!) 21 20 19   Temp: 98.5 F (36.9 C) 98.6 F (37 C) 98 F (36.7 C) 98.3 F (36.8 C)  TempSrc: Oral Oral Axillary Axillary  SpO2: 95% 96% 94% 97%  Weight:      Height:        NAD ABD soft Neurovascular intact Sensation intact distally Intact pulses distally Dorsiflexion/Plantar flexion intact Incision: dressing C/D/I   Lab Results  Component Value Date   WBC 8.7 09/05/2020   HGB 9.9 (L) 09/05/2020   HCT 31.6 (L) 09/05/2020   MCV 96.3 09/05/2020   PLT 85 (L) 09/05/2020   BMET    Component Value Date/Time   NA 140 09/05/2020 0343   NA 142 02/26/2019 1130   K 3.7 09/05/2020 0343   CL 104 09/05/2020 0343   CO2 26 09/05/2020 0343   GLUCOSE 147 (H) 09/05/2020 0343   BUN 27 (H) 09/05/2020 0343   BUN 16 02/26/2019 1130   CREATININE 0.75 09/05/2020 0343   CREATININE 0.79 05/24/2017 1659   CALCIUM 8.1 (L) 09/05/2020 0343   GFRNONAA >60 09/05/2020 0343   GFRNONAA 69 05/24/2017 1659   GFRAA 59 (L) 07/31/2019 1107   GFRAA 80 05/24/2017 1659     Assessment/Plan: 1 Day Post-Op   Principal Problem:   Fracture of femoral neck, right (HCC) Active Problems:   Essential hypertension   Paroxysmal atrial fibrillation (HCC)   Pacemaker -CRT-St. Jude   Dementia (HCC)   Current use of long term anticoagulation   Chronic obstructive pulmonary disease (HCC)   Closed hip fracture, right, initial encounter (HCC)   Closed fracture of right femur, unspecified fracture morphology, initial encounter ([place])   WBAT with walker DVT ppx: Apixaban, SCDs, TEDS    Resume home Apixaban dose 48-72hours post op PO pain control PT/OT Dispo: Pending    Dorothyann Peng 09/05/2020, 2:10  PM   Rod Can, MD [phone] [place] is now [facility]  [place] [address], [address], [place], [place] [address] Phone: [phone] [email or web address] Facebook  [employer]

## 2020-09-05 NOTE — Plan of Care (Signed)

## 2020-09-05 NOTE — Evaluation (Signed)
Physical Therapy Evaluation Patient Details Name: [name] [name] MRN: [id number] DOB: Jun 07, 1931 Today's Date: 09/05/2020   History of Present Illness  Patient is an 87 year old female s/p R hip hemiarthroplasty anterior approach after fall at facility. PMH includes paroxysmal atrial fibrillation, hypertension, CAD, COPD, dementia  Clinical Impression  Patient is s/p above surgery resulting in functional limitations due to the deficits listed below (see PT Problem List).  Patient will benefit from skilled PT to increase their independence and safety with mobility to allow discharge to the venue listed below.   Pt pleasantly confused and able to follow simple commands with increased time.  Pt performed sit to stand twice and ambulated 6 feet with RW.  Recommend d/c to SNF for rehab prior to return to ALF.     Follow Up Recommendations SNF    Equipment Recommendations  Wheelchair cushion (measurements PT);Wheelchair (measurements PT)    Recommendations for Other Services       Precautions / Restrictions Precautions Precautions: Fall Restrictions Weight Bearing Restrictions: Yes RLE Weight Bearing: Weight bearing as tolerated      Mobility  Bed Mobility Overal bed mobility: Needs Assistance Bed Mobility: Supine to Sit     Supine to sit: Mod assist;HOB elevated     General bed mobility comments: pt in recliner    Transfers Overall transfer level: Needs assistance Equipment used: Rolling walker (2 wheeled) Transfers: Sit to/from Stand Sit to Stand: Mod assist Stand pivot transfers: Mod assist       General transfer comment: multimodal cues for technique, assist to rise and steady, pt with posterior lean and requires cues to correct, performed sit to stand twice  Ambulation/Gait Ambulation/Gait assistance: Min assist Gait Distance (Feet): 6 Feet Assistive device: Rolling walker (2 wheeled) Gait Pattern/deviations: Step-to pattern;Antalgic;Decreased stance time -  right     General Gait Details: verbal cues for sequence and RW positioning, pt limited by fatigue and pain; provided recliner for pt to return to sitting  Stairs            Wheelchair Mobility    Modified Rankin (Stroke Patients Only)       Balance Overall balance assessment: Needs assistance;History of Falls Sitting-balance support: Feet supported Sitting balance-Leahy Scale: Fair   Postural control: Posterior lean Standing balance support: Bilateral upper extremity supported Standing balance-Leahy Scale: Poor Standing balance comment: requires UE support and external assist                             Pertinent Vitals/Pain Pain Assessment: Faces Faces Pain Scale: Hurts a little bit Pain Location: R hip Pain Descriptors / Indicators: Grimacing;Guarding Pain Intervention(s): Repositioned;Monitored during session    Home Living Family/patient expects to be discharged to:: Skilled nursing facility                      Prior Function Level of Independence: Needs assistance         Comments: unsure of patient's baseline due to hx of dementia, per chart review resides in memory care unit. patient reports she doesn't remember is she uses a RW     Hand Dominance   Dominant Hand:  (did not specify)    Extremity/Trunk Assessment   Upper Extremity Assessment Upper Extremity Assessment: Generalized weakness    Lower Extremity Assessment Lower Extremity Assessment: Generalized weakness;Difficult to assess due to impaired cognition       Communication   Communication: [facility]  Cognition Arousal/Alertness: Awake/alert Behavior During Therapy: WFL for tasks assessed/performed Overall Cognitive Status: History of cognitive impairments - at baseline                                 General Comments: able to follow simple commands with increased time      General Comments      Exercises     Assessment/Plan    PT Assessment  Patient needs continued PT services  PT Problem List Decreased strength;Decreased mobility;Decreased activity tolerance;Decreased balance;Decreased knowledge of use of DME;Decreased knowledge of precautions       PT Treatment Interventions Gait training;DME instruction;Therapeutic exercise;Balance training;Functional mobility training;Therapeutic activities;Patient/family education;Cognitive remediation    PT Goals (Current goals can be found in the Care Plan section)  Acute Rehab PT Goals Patient Stated Goal: "it hurts" PT Goal Formulation: Patient unable to participate in goal setting Time For Goal Achievement: 09/19/20 Potential to Achieve Goals: Fair    Frequency Min 3X/week   Barriers to discharge        Co-evaluation               AM-PAC PT "6 Clicks" Mobility  Outcome Measure Help needed turning from your back to your side while in a flat bed without using bedrails?: A Lot Help needed moving from lying on your back to sitting on the side of a flat bed without using bedrails?: A Lot Help needed moving to and from a bed to a chair (including a wheelchair)?: A Lot Help needed standing up from a chair using your arms (e.g., wheelchair or bedside chair)?: A Lot Help needed to walk in hospital room?: A Lot Help needed climbing 3-5 steps with a railing? : Total 6 Click Score: 11    End of Session Equipment Utilized During Treatment: Gait belt Activity Tolerance: Patient tolerated treatment well Patient left: in chair;with call bell/phone within reach;with chair alarm set Nurse Communication: Mobility status PT Visit Diagnosis: Difficulty in walking, not elsewhere classified (R26.2)    Time: 6546-5035 PT Time Calculation (min) (ACUTE ONLY): 17 min   Charges:   PT Evaluation $PT Eval Low Complexity: 1 Low         Kati PT, DPT Acute Rehabilitation Services Pager: [phone] Office: [phone]  York Ram E 09/05/2020, 1:39 PM

## 2020-09-05 NOTE — Progress Notes (Addendum)
PROGRESS NOTE    [name] [name]  CVE:[id number] DOB: September 15, 1931 DOA: 09/01/2020 PCP: Marin Olp, MD   Brief Narrative: This 87 y.o.femalewith PMH significant forparoxysmal atrial fibrillation, hypertension, CAD, COPD, dementia who presents after an unwitnessed fall at facility. Patient was found down on the ground on her right side with a small skin tear of her right elbow. She complained of pain in her right hip and groin region. Patient does not remember the fall and is not sure of what happened. Patient cannot provide any history secondary to her dementia. No family or staff from facility are present. She is found to have a right femoral neck fracture on x-ray. CT of her head was negative. She is anticoagulated on Eliquis chronically for A. Fib.  Patient was admitted for R hip fracture.   Ortho consulted , she underwent open reduction internal fixation on 5/26.  Tolerated well postoperative day 1.  PT recommended skilled nursing facility for rehab.  Assessment & Plan:   Principal Problem:   Fracture of femoral neck, right (HCC) Active Problems:   Essential hypertension   Paroxysmal atrial fibrillation (HCC)   Pacemaker -CRT-[employer]   Dementia (HCC)   Current use of long term anticoagulation   Chronic obstructive pulmonary disease (HCC)   Closed hip fracture, right, initial encounter ([place])   Closed fracture of right femur, unspecified fracture morphology, initial encounter ([place])   Displaced R Femoral Neck Fracture  Unwitnessed Fall. She presented with unwitnessed fall at [facility] care unit. CT C-spine/ head without acute intracranial pathology. No fracture or static subluxation of cervical spine.  Right knee x-ray without acute abnormality. R hip CT with acute mildly displaced and angulated R femoral neck fracture, moderate R hip osteoarthritis. Discussed with [name] (daughter), she's aware of her higher risk.  Given hx HF,  Echo completed showed reduced  EF.  Prior to injury, she ambulated without cane/walker, was able to dress, needed some assistance with toileting (lived at [facility]).   Holding eliquis at this time, while bridging with heparin Continue scheduled apap, oxy prn, morphine.  Miralax.  Continue PT post op Patient underwent ORIF on 5/26.  Tolerated well.  Postoperative day 1. Continue PT and OT therapy   Atrial Fibrillation  S/p Pacemaker Placement Chadsvasc at least 5 (age, F, HTN, CAD - echo shows EF 35 to 40%., borderline). Given high chadsvasc, will plan to bridge while holding eliquis - heparin per pharmacy resumed Eliquis after 24- 48  hours after ORIF.  CAD Not currently on antiplatelets or statin.  HFpEF  Mildly Reduced EF Echo 2019 with EF 45-50%, grade III diastolic dysfunction. Repeat echo shows reduced EF 35 to 40%. Appears euvolemic at this time. Cardio consulted, patient is cleared for OR with high risk.  Dementia Delirium precautions. Allow family to stay overnight to help avoid delirium. Continue home namenda, melatonin, lexapro.  Prolonged QTc Follow mag, caution with qt prolonging meds Hold home prn haldol. Continue lexapro for now   History of autonomic dysfunction? Continue pyridostigmine  Hypertension Continue lisinopril for now, BP slightly up,  suspect related to pain/agitation/discomfort   Thrombocytopenia Chronic.  Continue to monitor   DVT prophylaxis: Heparin Code Status: DNR Family Communication: No family at bed side. Disposition Plan:   Status is: Inpatient  Remains inpatient appropriate because:Inpatient level of care appropriate due to severity of illness   Dispo: The patient is from: [facility].  Anticipated d/c is to: SNF in 1-2 days.              Patient currently is not medically stable to d/c.   Difficult to place patient No   Consultants:   Ortho  Procedures:  ORIF 5/26. Antimicrobials:  Anti-infectives (From  admission, onward)   Start     Dose/Rate Route Frequency Ordered Stop   09/04/20 2100  ceFAZolin (ANCEF) IVPB 2g/100 mL premix        2 g 200 mL/hr over 30 Minutes Intravenous Every 6 hours 09/04/20 1805 09/05/20 0342   09/04/20 1400  ceFAZolin (ANCEF) IVPB 2g/100 mL premix        2 g 200 mL/hr over 30 Minutes Intravenous On call to O.R. 09/03/20 1857 09/04/20 1530     Subjective: Patient was seen and examined at bedside.  Overnight events noted.   She is sitting comfortably in the chair,  denies any pain.  She is s/p ORIF 5/26.  Postoperative day 1.  Objective: Vitals:   09/04/20 1806 09/04/20 1900 09/05/20 0606 09/05/20 1333  BP: (!) 150/57 (!) 139/58 (!) 154/63 (!) 106/40  Pulse: 69 70 70 70  Resp:  (!) 21 20 19   Temp: 98.5 F (36.9 C) 98.6 F (37 C) 98 F (36.7 C) 98.3 F (36.8 C)  TempSrc: Oral Oral Axillary Axillary  SpO2: 95% 96% 94% 97%  Weight:      Height:        Intake/Output Summary (Last 24 hours) at 09/05/2020 1423 Last data filed at 09/05/2020 0900 Gross per 24 hour  Intake 1150 ml  Output 1010 ml  Net 140 ml   Filed Weights   09/02/20 1603 09/04/20 1432  Weight: 58.6 kg 58.6 kg    Examination:  General exam: Appears calm and comfortable , not in any acute distress. Respiratory system: Clear to auscultation. Respiratory effort normal. Cardiovascular system: S1 & S2 heard, RRR. No JVD, murmurs, rubs, gallops or clicks. No pedal edema. Gastrointestinal system: Abdomen is nondistended, soft and nontender. No organomegaly or masses felt. Normal bowel sounds heard. Central nervous system: Alert and oriented. No focal neurological deficits. Extremities:   Right thigh tenderness+, able to stand up and walk with walker. Skin: No rashes, lesions or ulcers Psychiatry: Judgement and insight appear normal. Mood & affect appropriate.     Data Reviewed: I have personally reviewed following labs and imaging studies  CBC: Recent Labs  Lab 09/01/20 1951  09/02/20 0714 09/02/20 1715 09/03/20 0211 09/04/20 0334 09/05/20 0343  WBC 9.1 9.6 10.4 8.8 8.2 8.7  NEUTROABS 7.6  --   --  6.6  --   --   HGB 12.7 12.4 12.5 12.2 11.6* 9.9*  HCT 39.5 38.7 39.1 38.7 37.5 31.6*  MCV 94.3 96.0 95.4 96.8 96.9 96.3  PLT 120* 111* 106* 99* 88* 85*   Basic Metabolic Panel: Recent Labs  Lab 09/01/20 1951 09/02/20 0714 09/03/20 0211 09/04/20 0334 09/05/20 0343  NA 141 141 142 141 140  K 4.4 3.9 3.9 3.3* 3.7  CL 109 107 105 101 104  CO2 24 26 29 31 26   GLUCOSE 124* 164* 120* 112* 147*  BUN 19 18 20 20  27*  CREATININE 0.91 0.79 0.87 0.76 0.75  CALCIUM 8.7* 8.6* 8.9 8.6* 8.1*  MG  --   --  2.0 2.0  --   PHOS  --   --  4.0 3.1  --    GFR: Estimated Creatinine Clearance: 46.7 mL/min (by C-G formula  based on SCr of 0.75 mg/dL). Liver Function Tests: Recent Labs  Lab 09/01/20 1951 09/03/20 0211  AST 21 34  ALT 11 23  ALKPHOS 56 57  BILITOT 0.8 1.3*  PROT 6.4* 6.0*  ALBUMIN 4.0 3.7   No results for input(s): LIPASE, AMYLASE in the last 168 hours. No results for input(s): AMMONIA in the last 168 hours. Coagulation Profile: Recent Labs  Lab 09/01/20 1951 09/02/20 1711  INR 1.2 1.1   Cardiac Enzymes: No results for input(s): CKTOTAL, CKMB, CKMBINDEX, TROPONINI in the last 168 hours. BNP (last 3 results) No results for input(s): PROBNP in the last 8760 hours. HbA1C: No results for input(s): HGBA1C in the last 72 hours. CBG: Recent Labs  Lab 09/04/20 1718 09/04/20 1810 09/04/20 2022 09/05/20 0743 09/05/20 1147  GLUCAP 136* 126* 154* 118* 125*   Lipid Profile: No results for input(s): CHOL, HDL, LDLCALC, TRIG, CHOLHDL, LDLDIRECT in the last 72 hours. Thyroid Function Tests: No results for input(s): TSH, T4TOTAL, FREET4, T3FREE, THYROIDAB in the last 72 hours. Anemia Panel: No results for input(s): VITAMINB12, FOLATE, FERRITIN, TIBC, IRON, RETICCTPCT in the last 72 hours. Sepsis Labs: No results for input(s): PROCALCITON,  LATICACIDVEN in the last 168 hours.  Recent Results (from the past 240 hour(s))  Resp Panel by RT-PCR (Flu A&B, Covid) Nasopharyngeal Swab     Status: None   Collection Time: 09/01/20  8:28 PM   Specimen: Nasopharyngeal Swab; Nasopharyngeal(NP) swabs in vial transport medium  Result Value Ref Range Status   SARS Coronavirus 2 by RT PCR NEGATIVE NEGATIVE Final    Comment: (NOTE) SARS-CoV-2 target nucleic acids are NOT DETECTED.  The SARS-CoV-2 RNA is generally detectable in upper respiratory specimens during the acute phase of infection. The lowest concentration of SARS-CoV-2 viral copies this assay can detect is 138 copies/mL. A negative result does not preclude SARS-Cov-2 infection and should not be used as the sole basis for treatment or other patient management decisions. A negative result may occur with  improper specimen collection/handling, submission of specimen other than nasopharyngeal swab, presence of viral mutation(s) within the areas targeted by this assay, and inadequate number of viral copies(<138 copies/mL). A negative result must be combined with clinical observations, patient history, and epidemiological information. The expected result is Negative.  Fact Sheet for Patients:  EntrepreneurPulse.com.au  Fact Sheet for Healthcare Providers:  IncredibleEmployment.be  This test is no t yet approved or cleared by the [place] FDA and  has been authorized for detection and/or diagnosis of SARS-CoV-2 by FDA under an Emergency Use Authorization (EUA). This EUA will remain  in effect (meaning this test can be used) for the duration of the COVID-19 declaration under Section 564(b)(1) of the Act, 21 U.S.C.section 360bbb-3(b)(1), unless the authorization is terminated  or revoked sooner.       Influenza A by PCR NEGATIVE NEGATIVE Final   Influenza B by PCR NEGATIVE NEGATIVE Final    Comment: (NOTE) The Xpert Xpress  SARS-CoV-2/FLU/RSV plus assay is intended as an aid in the diagnosis of influenza from Nasopharyngeal swab specimens and should not be used as a sole basis for treatment. Nasal washings and aspirates are unacceptable for Xpert Xpress SARS-CoV-2/FLU/RSV testing.  Fact Sheet for Patients: EntrepreneurPulse.com.au  Fact Sheet for Healthcare Providers: IncredibleEmployment.be  This test is not yet approved or cleared by the [place] FDA and has been authorized for detection and/or diagnosis of SARS-CoV-2 by FDA under an Emergency Use Authorization (EUA). This EUA will remain in effect (meaning this  test can be used) for the duration of the COVID-19 declaration under Section 564(b)(1) of the Act, 21 U.S.C. section 360bbb-3(b)(1), unless the authorization is terminated or revoked.  Performed at [facility], [place] [address] [place], [place] [address]   Surgical pcr screen     Status: None   Collection Time: 09/04/20 12:10 AM   Specimen: Nasal Mucosa; Nasal Swab  Result Value Ref Range Status   MRSA, PCR NEGATIVE NEGATIVE Final   Staphylococcus aureus NEGATIVE NEGATIVE Final    Comment: (NOTE) The Xpert SA Assay (FDA approved for NASAL specimens in patients 14 years of age and older), is one component of a comprehensive surveillance program. It is not intended to diagnose infection nor to guide or monitor treatment. Performed at [facility], [place] [address], [place], [place] [address]     Radiology Studies: Pelvis Portable  Result Date: 09/04/2020 CLINICAL DATA:  Hip replacement EXAM: PORTABLE PELVIS 1-2 VIEWS COMPARISON:  09/04/2020, 09/01/2020 FINDINGS: Interval right hip replacement with intact hardware and normal alignment. Gas in the soft tissues consistent with recent surgery. Pelvic sidewall clips. IMPRESSION: Interval right hip replacement with expected postsurgical change Electronically Signed    By: Donavan Foil M.D.   On: 09/04/2020 17:59   DG C-Arm 1-60 Min-No Report  Result Date: 09/04/2020 Fluoroscopy was utilized by the requesting physician.  No radiographic interpretation.   DG HIP OPERATIVE UNILAT W OR W/O PELVIS RIGHT  Result Date: 09/04/2020 CLINICAL DATA:  Right hip replacement EXAM: OPERATIVE RIGHT HIP WITH PELVIS COMPARISON:  09/02/2020 FLUOROSCOPY TIME:  Radiation Exposure Index (as provided by the fluoroscopic device): 0.46 mGy If the device does not provide the exposure index: Fluoroscopy Time:  5 seconds Number of Acquired Images:  2 FINDINGS: Right hemiarthroplasty is noted in satisfactory position. No bony or soft tissue abnormality is noted. IMPRESSION: Status post right hip hemiarthroplasty Electronically Signed   By: Inez Catalina M.D.   On: 09/04/2020 16:31   Scheduled Meds: . apixaban  2.5 mg Oral BID  . Chlorhexidine Gluconate Cloth  6 each Topical Daily  . docusate sodium  100 mg Oral BID  . escitalopram  20 mg Oral Daily  . feeding supplement  237 mL Oral BID BM  . insulin aspart  0-5 Units Subcutaneous QHS  . insulin aspart  0-9 Units Subcutaneous TID WC  . lisinopril  2.5 mg Oral Daily  . melatonin  10 mg Oral QHS  . memantine  5 mg Oral BID  . polyethylene glycol  17 g Oral Daily  . pyridostigmine  60 mg Oral Daily  . senna  1 tablet Oral BID   Continuous Infusions: . methocarbamol (ROBAXIN) IV       LOS: 4 days    Time spent: 25 mins    Shawna Clamp, MD [facility]   If 7PM-7AM, please contact night-coverage

## 2020-09-05 NOTE — TOC Progression Note (Signed)
Transition of Care [facility]) - Progression Note    Patient Details  Name: Halaina Vanduzer MRN: [id number] Date of Birth: 06-15-1931  Transition of Care [facility]) CM/SW Contact  Ross Ludwig, [place] Phone Number: 09/05/2020, 5:05 PM  Clinical Narrative:     CSW spoke to patient's community social worker Baird Kay, [phone] who is advocating for patient and updated her with bed offers.  Patient's family is not interested in the provided bed offers of [facility], [facility], or [facility].  [facility] is not able to accept patient neither is [facility].  CSW attempted to contact [place], [place], CSW had to leave a message awaiting for a call back.  CSW to continue to follow patient will need insurance auth before patient is able to discharge to SNF.    Expected Discharge Plan: [place] Barriers to Discharge: Continued Medical Work up  Expected Discharge Plan and Services Expected Discharge Plan: [place] In-house Referral: Clinical Social Work   Post Acute Care Choice: [place] Living arrangements for the past 2 months: [facility] (Memory care)                                       Social Determinants of Health (SDOH) Interventions    Readmission Risk Interventions No flowsheet data found.

## 2020-09-06 LAB — CBC
HCT: 31.1 % — ABNORMAL LOW (ref 36.0–46.0)
Hemoglobin: 9.8 g/dL — ABNORMAL LOW (ref 12.0–15.0)
MCH: 30.5 pg (ref 26.0–34.0)
MCHC: 31.5 g/dL (ref 30.0–36.0)
MCV: 96.9 fL (ref 80.0–100.0)
Platelets: 105 10*3/uL — ABNORMAL LOW (ref 150–400)
RBC: 3.21 MIL/uL — ABNORMAL LOW (ref 3.87–5.11)
RDW: 14.4 % (ref 11.5–15.5)
WBC: 7.4 10*3/uL (ref 4.0–10.5)
nRBC: 0 % (ref 0.0–0.2)

## 2020-09-06 LAB — BASIC METABOLIC PANEL WITH GFR
Anion gap: 8 (ref 5–15)
BUN: 27 mg/dL — ABNORMAL HIGH (ref 8–23)
CO2: 31 mmol/L (ref 22–32)
Calcium: 8.3 mg/dL — ABNORMAL LOW (ref 8.9–10.3)
Chloride: 103 mmol/L (ref 98–111)
Creatinine, Ser: 0.68 mg/dL (ref 0.44–1.00)
GFR, Estimated: >60 mL/min
Glucose, Bld: 105 mg/dL — ABNORMAL HIGH (ref 70–99)
Potassium: 3.4 mmol/L — ABNORMAL LOW (ref 3.5–5.1)
Sodium: 142 mmol/L (ref 135–145)

## 2020-09-06 LAB — GLUCOSE, CAPILLARY
Glucose-Capillary: 107 mg/dL — ABNORMAL HIGH (ref 70–99)
Glucose-Capillary: 142 mg/dL — ABNORMAL HIGH (ref 70–99)
Glucose-Capillary: 158 mg/dL — ABNORMAL HIGH (ref 70–99)
Glucose-Capillary: 119 mg/dL — ABNORMAL HIGH (ref 70–99)

## 2020-09-06 MED ORDER — POTASSIUM CHLORIDE 20 MEQ PO PACK
40.0000 meq | PACK | Freq: Once | ORAL | Status: AC
  Administered 2020-09-06: 40 meq via ORAL
  Filled 2020-09-06: qty 2

## 2020-09-06 NOTE — TOC Progression Note (Signed)
Transition of Care [facility]) - Progression Note    Patient Details  Name: [name] [name] MRN: [id number] Date of Birth: 12/06/1931  Transition of Care [facility]) CM/SW Contact  Ross Ludwig, [place] Phone Number: 09/06/2020, 5:37 PM  Clinical Narrative:     CSW was informed that [facility] can accept patient on Tuesday if she is medically ready for discharge.  Patient will require a new covid test prior to discharge.  Expected Discharge Plan: [place] Barriers to Discharge: Continued Medical Work up  Expected Discharge Plan and Services Expected Discharge Plan: [place] In-house Referral: Clinical Social Work   Post Acute Care Choice: [place] Living arrangements for the past 2 months: [facility] (Memory care)                                       Social Determinants of Health (SDOH) Interventions    Readmission Risk Interventions No flowsheet data found.

## 2020-09-06 NOTE — Plan of Care (Signed)
  Problem: Health Behavior/Discharge Planning: Goal: Ability to manage health-related needs will improve Outcome: Progressing   Problem: Clinical Measurements: Goal: Diagnostic test results will improve Outcome: Progressing   Problem: Activity: Goal: Risk for activity intolerance will decrease Outcome: Progressing   Problem: Safety: Goal: Ability to remain free from injury will improve Outcome: Progressing

## 2020-09-06 NOTE — Progress Notes (Signed)
PROGRESS NOTE    [name] [name]  LEX:[id number] DOB: 04/17/1931 DOA: 09/01/2020 PCP: Marin Olp, MD   Brief Narrative: This 87 y.o.femalewith PMH significant forparoxysmal atrial fibrillation, hypertension, CAD, COPD, dementia who presents after an unwitnessed fall at facility. Patient was found down on the ground on her right side with a small skin tear of her right elbow. She complained of pain in her right hip and groin region. Patient does not remember the fall and is not sure of what happened. Patient cannot provide any history secondary to her dementia. No family or staff from facility are present. She is found to have a right femoral neck fracture on x-ray. CT of her head was negative.She is anticoagulated on Eliquis chronically for A. Fib.  Patient was admitted for Right femoral neck fracture.   Ortho consulted , she underwent open reduction internal fixation on 5/26.  Tolerated well postoperative day 2.  PT recommended skilled nursing facility for rehab.  Assessment & Plan:   Principal Problem:   Fracture of femoral neck, right (HCC) Active Problems:   Essential hypertension   Paroxysmal atrial fibrillation (HCC)   Pacemaker -CRT-St. Jude   Dementia (HCC)   Current use of long term anticoagulation   Chronic obstructive pulmonary disease (HCC)   Closed hip fracture, right, initial encounter ([place])   Closed fracture of right femur, unspecified fracture morphology, initial encounter ([place])   Displaced Right Femoral Neck Fracture  Unwitnessed Fall. She presented with unwitnessed fall at [facility] care unit. CT C-spine/ head without acute intracranial pathology. No fracture or static subluxation of cervical spine.  Right knee x-ray without acute abnormality. R hip CT with acute mildly displaced and angulated R femoral neck fracture, moderate R hip osteoarthritis. Discussed with [name] (daughter), she's aware of her higher risk.  Given hx HF,  Echo completed  showed reduced EF.  Prior to injury, she ambulated without cane/walker, was able to dress, needed some assistance with toileting (lived at [facility]).   Holding eliquis at this time, while bridging with heparin Continue scheduled apap, oxy prn, morphine.  Miralax.  Continue PT post op Patient underwent ORIF on 5/26.  Tolerated well.  Postoperative day 2. Continue PT and OT therapy   Atrial Fibrillation  S/p Pacemaker Placement Chadsvasc at least 5 (age, F, HTN, CAD - echo shows EF 35 to 40%., borderline). Given high chadsvasc, will plan to bridge while holding eliquis - heparin per pharmacy Resumed Eliquis after 24- 48  hours after ORIF.  CAD Not currently on antiplatelets or statin.  HFpEF  Mildly Reduced EF Echo 2019 with EF 45-50%, grade III diastolic dysfunction. Repeat echo shows reduced EF 35 to 40%. Cardio consulted, patient cleared for OR with high risk. Appears euvolemic at this time.  Dementia Delirium precautions. Allow family to stay overnight to help avoid delirium. Continue home namenda, melatonin, lexapro.  Prolonged QTc Avoid QT prolonging meds. Hold home prn haldol. Continue lexapro for now   History of autonomic dysfunction? Continue pyridostigmine  Hypertension Continue lisinopril for now, BP slightly up,  suspect related to pain/agitation/discomfort  Thrombocytopenia Chronic.  Continue to monitor   DVT prophylaxis: Eliquis Code Status: DNR Family Communication: No family at bed side. Disposition Plan:   Status is: Inpatient  Remains inpatient appropriate because:Inpatient level of care appropriate due to severity of illness   Dispo: The patient is from: [place] memory care.              Anticipated d/c is to:  SNF in 1-2 days.              Patient currently is not medically stable to d/c.   Difficult to place patient No   Consultants:   Ortho  Procedures:  ORIF 5/26. Antimicrobials:  Anti-infectives (From admission,  onward)   Start     Dose/Rate Route Frequency Ordered Stop   09/04/20 2100  ceFAZolin (ANCEF) IVPB 2g/100 mL premix        2 g 200 mL/hr over 30 Minutes Intravenous Every 6 hours 09/04/20 1805 09/05/20 0342   09/04/20 1400  ceFAZolin (ANCEF) IVPB 2g/100 mL premix        2 g 200 mL/hr over 30 Minutes Intravenous On call to O.R. 09/03/20 1857 09/04/20 1530     Subjective: Patient was seen and examined at bedside.  Overnight events noted.   She is lying comfortably in the bed,  denies any pain.  She is s/p ORIF 5/26.  Postoperative day 2.  Objective: Vitals:   09/05/20 0606 09/05/20 1333 09/05/20 2126 09/06/20 0538  BP: (!) 154/63 (!) 106/40 (!) 130/51 136/67  Pulse: 70 70 70 69  Resp: 20 19 18 18   Temp: 98 F (36.7 C) 98.3 F (36.8 C) 98.7 F (37.1 C) 98.6 F (37 C)  TempSrc: Axillary Axillary Oral Oral  SpO2: 94% 97% 100% 97%  Weight:      Height:        Intake/Output Summary (Last 24 hours) at 09/06/2020 1136 Last data filed at 09/06/2020 0600 Gross per 24 hour  Intake 120 ml  Output 200 ml  Net -80 ml   Filed Weights   09/02/20 1603 09/04/20 1432  Weight: 58.6 kg 58.6 kg    Examination:  General exam: Appears calm and comfortable , not in any acute distress. Respiratory system: Clear to auscultation. Respiratory effort normal. Cardiovascular system: S1 & S2 heard, RRR. No JVD, murmurs, rubs, gallops or clicks. No pedal edema. Gastrointestinal system: Abdomen is nondistended, soft and nontender. No organomegaly or masses felt.  Normal bowel sounds heard. Central nervous system: Alert and oriented. No focal neurological deficits. Extremities:   Right thigh tenderness+, able to stand up and walk with walker. Skin: No rashes, lesions or ulcers Psychiatry: Judgement and insight appear normal. Mood & affect appropriate.     Data Reviewed: I have personally reviewed following labs and imaging studies  CBC: Recent Labs  Lab 09/01/20 1951 09/02/20 0714  09/02/20 1715 09/03/20 0211 09/04/20 0334 09/05/20 0343 09/06/20 0153  WBC 9.1   < > 10.4 8.8 8.2 8.7 7.4  NEUTROABS 7.6  --   --  6.6  --   --   --   HGB 12.7   < > 12.5 12.2 11.6* 9.9* 9.8*  HCT 39.5   < > 39.1 38.7 37.5 31.6* 31.1*  MCV 94.3   < > 95.4 96.8 96.9 96.3 96.9  PLT 120*   < > 106* 99* 88* 85* 105*   < > = values in this interval not displayed.   Basic Metabolic Panel: Recent Labs  Lab 09/02/20 0714 09/03/20 0211 09/04/20 0334 09/05/20 0343 09/06/20 0153  NA 141 142 141 140 142  K 3.9 3.9 3.3* 3.7 3.4*  CL 107 105 101 104 103  CO2 26 29 31 26 31   GLUCOSE 164* 120* 112* 147* 105*  BUN 18 20 20  27* 27*  CREATININE 0.79 0.87 0.76 0.75 0.68  CALCIUM 8.6* 8.9 8.6* 8.1* 8.3*  MG  --  2.0  2.0  --   --   PHOS  --  4.0 3.1  --   --    GFR: Estimated Creatinine Clearance: 46.7 mL/min (by C-G formula based on SCr of 0.68 mg/dL). Liver Function Tests: Recent Labs  Lab 09/01/20 1951 09/03/20 0211  AST 21 34  ALT 11 23  ALKPHOS 56 57  BILITOT 0.8 1.3*  PROT 6.4* 6.0*  ALBUMIN 4.0 3.7   No results for input(s): LIPASE, AMYLASE in the last 168 hours. No results for input(s): AMMONIA in the last 168 hours. Coagulation Profile: Recent Labs  Lab 09/01/20 1951 09/02/20 1711  INR 1.2 1.1   Cardiac Enzymes: No results for input(s): CKTOTAL, CKMB, CKMBINDEX, TROPONINI in the last 168 hours. BNP (last 3 results) No results for input(s): PROBNP in the last 8760 hours. HbA1C: No results for input(s): HGBA1C in the last 72 hours. CBG: Recent Labs  Lab 09/05/20 0743 09/05/20 1147 09/05/20 1657 09/05/20 2119 09/06/20 0823  GLUCAP 118* 125* 122* 118* 107*   Lipid Profile: No results for input(s): CHOL, HDL, LDLCALC, TRIG, CHOLHDL, LDLDIRECT in the last 72 hours. Thyroid Function Tests: No results for input(s): TSH, T4TOTAL, FREET4, T3FREE, THYROIDAB in the last 72 hours. Anemia Panel: No results for input(s): VITAMINB12, FOLATE, FERRITIN, TIBC, IRON,  RETICCTPCT in the last 72 hours. Sepsis Labs: No results for input(s): PROCALCITON, LATICACIDVEN in the last 168 hours.  Recent Results (from the past 240 hour(s))  Resp Panel by RT-PCR (Flu A&B, Covid) Nasopharyngeal Swab     Status: None   Collection Time: 09/01/20  8:28 PM   Specimen: Nasopharyngeal Swab; Nasopharyngeal(NP) swabs in vial transport medium  Result Value Ref Range Status   SARS Coronavirus 2 by RT PCR NEGATIVE NEGATIVE Final    Comment: (NOTE) SARS-CoV-2 target nucleic acids are NOT DETECTED.  The SARS-CoV-2 RNA is generally detectable in upper respiratory specimens during the acute phase of infection. The lowest concentration of SARS-CoV-2 viral copies this assay can detect is 138 copies/mL. A negative result does not preclude SARS-Cov-2 infection and should not be used as the sole basis for treatment or other patient management decisions. A negative result may occur with  improper specimen collection/handling, submission of specimen other than nasopharyngeal swab, presence of viral mutation(s) within the areas targeted by this assay, and inadequate number of viral copies(<138 copies/mL). A negative result must be combined with clinical observations, patient history, and epidemiological information. The expected result is Negative.  Fact Sheet for Patients:  EntrepreneurPulse.com.au  Fact Sheet for Healthcare Providers:  IncredibleEmployment.be  This test is no t yet approved or cleared by the [place] FDA and  has been authorized for detection and/or diagnosis of SARS-CoV-2 by FDA under an Emergency Use Authorization (EUA). This EUA will remain  in effect (meaning this test can be used) for the duration of the COVID-19 declaration under Section 564(b)(1) of the Act, 21 U.S.C.section 360bbb-3(b)(1), unless the authorization is terminated  or revoked sooner.       Influenza A by PCR NEGATIVE NEGATIVE Final   Influenza  B by PCR NEGATIVE NEGATIVE Final    Comment: (NOTE) The Xpert Xpress SARS-CoV-2/FLU/RSV plus assay is intended as an aid in the diagnosis of influenza from Nasopharyngeal swab specimens and should not be used as a sole basis for treatment. Nasal washings and aspirates are unacceptable for Xpert Xpress SARS-CoV-2/FLU/RSV testing.  Fact Sheet for Patients: EntrepreneurPulse.com.au  Fact Sheet for Healthcare Providers: IncredibleEmployment.be  This test is not yet approved or cleared  by the [place] and has been authorized for detection and/or diagnosis of SARS-CoV-2 by FDA under an Emergency Use Authorization (EUA). This EUA will remain in effect (meaning this test can be used) for the duration of the COVID-19 declaration under Section 564(b)(1) of the Act, 21 U.S.C. section 360bbb-3(b)(1), unless the authorization is terminated or revoked.  Performed at [facility], [place] [address] [place], [place] [address]   Surgical pcr screen     Status: None   Collection Time: 09/04/20 12:10 AM   Specimen: Nasal Mucosa; Nasal Swab  Result Value Ref Range Status   MRSA, PCR NEGATIVE NEGATIVE Final   Staphylococcus aureus NEGATIVE NEGATIVE Final    Comment: (NOTE) The Xpert SA Assay (FDA approved for NASAL specimens in patients 15 years of age and older), is one component of a comprehensive surveillance program. It is not intended to diagnose infection nor to guide or monitor treatment. Performed at [facility], [place] [address] [place], [place] [address]     Radiology Studies: Pelvis Portable  Result Date: 09/04/2020 CLINICAL DATA:  Hip replacement EXAM: PORTABLE PELVIS 1-2 VIEWS COMPARISON:  09/04/2020, 09/01/2020 FINDINGS: Interval right hip replacement with intact hardware and normal alignment. Gas in the soft tissues consistent with recent surgery. Pelvic sidewall clips. IMPRESSION: Interval right hip  replacement with expected postsurgical change Electronically Signed   By: Donavan Foil M.D.   On: 09/04/2020 17:59   DG C-Arm 1-60 Min-No Report  Result Date: 09/04/2020 Fluoroscopy was utilized by the requesting physician.  No radiographic interpretation.   DG HIP OPERATIVE UNILAT W OR W/O PELVIS RIGHT  Result Date: 09/04/2020 CLINICAL DATA:  Right hip replacement EXAM: OPERATIVE RIGHT HIP WITH PELVIS COMPARISON:  09/02/2020 FLUOROSCOPY TIME:  Radiation Exposure Index (as provided by the fluoroscopic device): 0.46 mGy If the device does not provide the exposure index: Fluoroscopy Time:  5 seconds Number of Acquired Images:  2 FINDINGS: Right hemiarthroplasty is noted in satisfactory position. No bony or soft tissue abnormality is noted. IMPRESSION: Status post right hip hemiarthroplasty Electronically Signed   By: Inez Catalina M.D.   On: 09/04/2020 16:31   Scheduled Meds: . apixaban  2.5 mg Oral BID  . docusate sodium  100 mg Oral BID  . escitalopram  20 mg Oral Daily  . feeding supplement  237 mL Oral BID BM  . insulin aspart  0-5 Units Subcutaneous QHS  . insulin aspart  0-9 Units Subcutaneous TID WC  . lisinopril  2.5 mg Oral Daily  . melatonin  10 mg Oral QHS  . memantine  5 mg Oral BID  . polyethylene glycol  17 g Oral Daily  . pyridostigmine  60 mg Oral Daily  . senna  1 tablet Oral BID   Continuous Infusions: . methocarbamol (ROBAXIN) IV       LOS: 5 days    Time spent: 25 mins    Shawna Clamp, MD [facility]   If 7PM-7AM, please contact night-coverage

## 2020-09-06 NOTE — Progress Notes (Signed)
Subjective: 2 Days Post-Op Procedure(s) (LRB): ANTERIOR TOTAL HIP ARTHROPLASTY (Right) Patient is poor historian. reports pain as mild.  No complaints.   Objective: Vital signs in last 24 hours: Temp:  [98.3 F (36.8 C)-98.7 F (37.1 C)] 98.6 F (37 C) (05/28 0538) Pulse Rate:  [69-70] 69 (05/28 0538) Resp:  [18-19] 18 (05/28 0538) BP: (106-136)/(40-67) 136/67 (05/28 0538) SpO2:  [97 %-100 %] 97 % (05/28 0538)  Intake/Output from previous day: 05/27 0701 - 05/28 0700 In: 170 [P.O.:170] Out: 200 [Urine:200] Intake/Output this shift: No intake/output data recorded.  Recent Labs    09/04/20 0334 09/05/20 0343 09/06/20 0153  HGB 11.6* 9.9* 9.8*   Recent Labs    09/05/20 0343 09/06/20 0153  WBC 8.7 7.4  RBC 3.28* 3.21*  HCT 31.6* 31.1*  PLT 85* 105*   Recent Labs    09/05/20 0343 09/06/20 0153  NA 140 142  K 3.7 3.4*  CL 104 103  CO2 26 31  BUN 27* 27*  CREATININE 0.75 0.68  GLUCOSE 147* 105*  CALCIUM 8.1* 8.3*   No results for input(s): LABPT, INR in the last 72 hours.  Neurologically intact ABD soft Neurovascular intact Sensation intact distally Intact pulses distally Dorsiflexion/Plantar flexion intact Incision: no drainage No cellulitis present Compartment soft   Assessment/Plan: 2 Days Post-Op Procedure(s) (LRB): ANTERIOR TOTAL HIP ARTHROPLASTY (Right) WBAT with walker DVT ppx: Apixaban, SCDs, TEDS    Resume home Apixaban dose 48-72hours post op PO pain control PT/OT Dispo: Pending   Charlyne Petrin 09/06/2020, 8:37 AM

## 2020-09-07 LAB — GLUCOSE, CAPILLARY
Glucose-Capillary: 112 mg/dL — ABNORMAL HIGH (ref 70–99)
Glucose-Capillary: 136 mg/dL — ABNORMAL HIGH (ref 70–99)
Glucose-Capillary: 103 mg/dL — ABNORMAL HIGH (ref 70–99)
Glucose-Capillary: 120 mg/dL — ABNORMAL HIGH (ref 70–99)

## 2020-09-07 LAB — CBC
HCT: 29 % — ABNORMAL LOW (ref 36.0–46.0)
Hemoglobin: 9.1 g/dL — ABNORMAL LOW (ref 12.0–15.0)
MCH: 29.8 pg (ref 26.0–34.0)
MCHC: 31.4 g/dL (ref 30.0–36.0)
MCV: 95.1 fL (ref 80.0–100.0)
Platelets: 119 10*3/uL — ABNORMAL LOW (ref 150–400)
RBC: 3.05 MIL/uL — ABNORMAL LOW (ref 3.87–5.11)
RDW: 14.1 % (ref 11.5–15.5)
WBC: 7.7 10*3/uL (ref 4.0–10.5)
nRBC: 0 % (ref 0.0–0.2)

## 2020-09-07 NOTE — Progress Notes (Signed)
Subjective: 3 Days Post-Op Procedure(s) (LRB): ANTERIOR TOTAL HIP ARTHROPLASTY (Right) Patient reports pain as 3 on 0-10 scale.   Denies CP or SOB.  Voiding without difficulty. Positive flatus. Objective: Vital signs in last 24 hours: Temp:  [98.1 F (36.7 C)-99.7 F (37.6 C)] 99.7 F (37.6 C) (05/29 7591) Pulse Rate:  [70] 70 (05/29 0608) Resp:  [20-24] 20 (05/29 0608) BP: (122-157)/(54-61) 157/54 (05/29 0608) SpO2:  [95 %-100 %] 97 % (05/29 6384)  Intake/Output from previous day: 05/28 0701 - 05/29 0700 In: 360 [P.O.:360] Out: 350 [Urine:350] Intake/Output this shift: No intake/output data recorded.  Recent Labs    09/05/20 0343 09/06/20 0153 09/07/20 0320  HGB 9.9* 9.8* 9.1*   Recent Labs    09/06/20 0153 09/07/20 0320  WBC 7.4 7.7  RBC 3.21* 3.05*  HCT 31.1* 29.0*  PLT 105* 119*   Recent Labs    09/05/20 0343 09/06/20 0153  NA 140 142  K 3.7 3.4*  CL 104 103  CO2 26 31  BUN 27* 27*  CREATININE 0.75 0.68  GLUCOSE 147* 105*  CALCIUM 8.1* 8.3*   No results for input(s): LABPT, INR in the last 72 hours.  Neurologically intact Neurovascular intact Dorsiflexion/Plantar flexion intact Incision: dressing C/D/I  Assessment/Plan:  3 Days Post-Op Procedure(s) (LRB): ANTERIOR TOTAL HIP ARTHROPLASTY (Right) Advance diet Up with therapy Discharge to SNF when avail   Principal Problem:   Fracture of femoral neck, right (HCC) Active Problems:   Essential hypertension   Paroxysmal atrial fibrillation (HCC)   Pacemaker -CRT-St. Jude   Dementia ([place])   Current use of long term anticoagulation   Chronic obstructive pulmonary disease ([place])   Closed hip fracture, right, initial encounter ([place])   Closed fracture of right femur, unspecified fracture morphology, initial encounter ([place])      [place] 09/07/2020, @NOW 

## 2020-09-07 NOTE — Progress Notes (Signed)
PROGRESS NOTE    [name] [name]  ZPH:[id number] DOB: 1932/03/14 DOA: 09/01/2020 PCP: Marin Olp, MD   Brief Narrative: This 87 y.o.femalewith PMH significant forparoxysmal atrial fibrillation, hypertension, CAD, COPD, dementia who presents after an unwitnessed fall at facility. Patient was found down on the ground on her right side with a small skin tear of her right elbow. She complained of pain in her right hip and groin region. Patient does not remember the fall and is not sure of what happened. Patient cannot provide any history secondary to her dementia. No family or staff from facility are present. She is found to have a right femoral neck fracture on x-ray. CT of her head was negative.She is anticoagulated on Eliquis chronically for A. Fib.  Patient was admitted for Right femoral neck fracture. Ortho consulted , she underwent open reduction internal fixation on 5/26.  Tolerated well postoperative day 3.  PT recommended skilled nursing facility for rehab.  Assessment & Plan:   Principal Problem:   Fracture of femoral neck, right (HCC) Active Problems:   Essential hypertension   Paroxysmal atrial fibrillation (HCC)   Pacemaker -CRT-[employer]   Dementia (HCC)   Current use of long term anticoagulation   Chronic obstructive pulmonary disease (HCC)   Closed hip fracture, right, initial encounter ([place])   Closed fracture of right femur, unspecified fracture morphology, initial encounter ([place])   Displaced Right Femoral Neck Fracture  Unwitnessed Fall. She presented with unwitnessed fall at [facility] care unit. CT C-spine/ head without acute intracranial pathology. No fracture or static subluxation of cervical spine.  Right knee x-ray without acute abnormality. R hip CT with acute mildly displaced and angulated R femoral neck fracture, moderate R hip osteoarthritis. Discussed with [name] [name] (daughter), she's aware of her higher risk.  Given hx HF,  Echo completed  showed reduced EF.  Prior to injury, she ambulated without cane/walker, was able to dress, needed some assistance with toileting (lived at [facility]).   Continue scheduled apap, oxy prn, morphine.  Miralax.  Continue PT post op Patient underwent ORIF on 5/26.  Tolerated well.  Postoperative day 3. Continue PT and OT therapy   Atrial Fibrillation  S/p Pacemaker Placement Chadsvasc at least 5 (age, F, HTN, CAD - echo shows EF 35 to 40%., borderline). Given high chadsvasc, was on heparin while having surgery - heparin per pharmacy Resumed Eliquis after 24- 48  hours after ORIF.  CAD Not currently on antiplatelets or statin.  HFpEF  Mildly Reduced EF Echo 2019 with EF 45-50%, grade III diastolic dysfunction. Repeat echo shows reduced EF 35 to 40%. Cardio consulted, patient cleared for OR with high risk. Appears euvolemic at this time.  Dementia Delirium precautions. Allow family to stay overnight to help avoid delirium. Continue home namenda, melatonin, lexapro.  Prolonged QTc Avoid QT prolonging meds. Hold home prn haldol. Continue lexapro for now   History of autonomic dysfunction? Continue pyridostigmine  Hypertension Continue lisinopril.  suspect related to pain /agitation / discomfort.  Thrombocytopenia Chronic.  Continue to monitor   DVT prophylaxis: Eliquis Code Status: DNR Family Communication: No family at bed side. Disposition Plan:   Status is: Inpatient  Remains inpatient appropriate because:Inpatient level of care appropriate due to severity of illness   Dispo: The patient is from: [facility] memory care.              Anticipated d/c is to: SNF in 1-2 days.  Patient currently is medically stable for dc   Difficult to place patient No   Consultants:   Ortho  Procedures:  ORIF 5/26.  Antimicrobials:  Anti-infectives (From admission, onward)   Start     Dose/Rate Route Frequency Ordered Stop   09/04/20 2100  ceFAZolin  (ANCEF) IVPB 2g/100 mL premix        2 g 200 mL/hr over 30 Minutes Intravenous Every 6 hours 09/04/20 1805 09/05/20 0342   09/04/20 1400  ceFAZolin (ANCEF) IVPB 2g/100 mL premix        2 g 200 mL/hr over 30 Minutes Intravenous On call to O.R. 09/03/20 1857 09/04/20 1530     Subjective: Patient was seen and examined at bedside.  Overnight events noted.   She is lying comfortably in the bed,  denies any pain.  She is s/p ORIF 5/26.  Postoperative day 3.  Objective: Vitals:   09/06/20 0538 09/06/20 1303 09/06/20 2128 09/07/20 0608  BP: 136/67 122/61 (!) 146/56 (!) 157/54  Pulse: 69 70 70 70  Resp: 18 (!) 24 (!) 21 20  Temp: 98.6 F (37 C) 98.8 F (37.1 C) 98.1 F (36.7 C) 99.7 F (37.6 C)  TempSrc: Oral Oral Oral Oral  SpO2: 97% 95% 100% 97%  Weight:      Height:        Intake/Output Summary (Last 24 hours) at 09/07/2020 1154 Last data filed at 09/07/2020 1100 Gross per 24 hour  Intake 360 ml  Output 500 ml  Net -140 ml   Filed Weights   09/02/20 1603 09/04/20 1432  Weight: 58.6 kg 58.6 kg    Examination:  General exam: Appears calm and comfortable , not in any acute distress. Respiratory system: Clear to auscultation. Respiratory effort normal. Cardiovascular system: S1 & S2 heard, RRR. No JVD, murmurs, rubs, gallops or clicks. No pedal edema. Gastrointestinal system: Abdomen is nondistended, soft and nontender. No organomegaly or masses felt.  Normal bowel sounds heard. Central nervous system: Alert and oriented. No focal neurological deficits. Extremities: Right thigh tenderness+, able to stand up and walk with walker. Skin: No rashes, lesions or ulcers Psychiatry: Judgement and insight appear normal. Mood & affect appropriate.     Data Reviewed: I have personally reviewed following labs and imaging studies  CBC: Recent Labs  Lab 09/01/20 1951 09/02/20 0714 09/03/20 0211 09/04/20 0334 09/05/20 0343 09/06/20 0153 09/07/20 0320  WBC 9.1   < > 8.8 8.2 8.7  7.4 7.7  NEUTROABS 7.6  --  6.6  --   --   --   --   HGB 12.7   < > 12.2 11.6* 9.9* 9.8* 9.1*  HCT 39.5   < > 38.7 37.5 31.6* 31.1* 29.0*  MCV 94.3   < > 96.8 96.9 96.3 96.9 95.1  PLT 120*   < > 99* 88* 85* 105* 119*   < > = values in this interval not displayed.   Basic Metabolic Panel: Recent Labs  Lab 09/02/20 0714 09/03/20 0211 09/04/20 0334 09/05/20 0343 09/06/20 0153  NA 141 142 141 140 142  K 3.9 3.9 3.3* 3.7 3.4*  CL 107 105 101 104 103  CO2 26 29 31 26 31   GLUCOSE 164* 120* 112* 147* 105*  BUN 18 20 20  27* 27*  CREATININE 0.79 0.87 0.76 0.75 0.68  CALCIUM 8.6* 8.9 8.6* 8.1* 8.3*  MG  --  2.0 2.0  --   --   PHOS  --  4.0 3.1  --   --  GFR: Estimated Creatinine Clearance: 46.7 mL/min (by C-G formula based on SCr of 0.68 mg/dL). Liver Function Tests: Recent Labs  Lab 09/01/20 1951 09/03/20 0211  AST 21 34  ALT 11 23  ALKPHOS 56 57  BILITOT 0.8 1.3*  PROT 6.4* 6.0*  ALBUMIN 4.0 3.7   No results for input(s): LIPASE, AMYLASE in the last 168 hours. No results for input(s): AMMONIA in the last 168 hours. Coagulation Profile: Recent Labs  Lab 09/01/20 1951 09/02/20 1711  INR 1.2 1.1   Cardiac Enzymes: No results for input(s): CKTOTAL, CKMB, CKMBINDEX, TROPONINI in the last 168 hours. BNP (last 3 results) No results for input(s): PROBNP in the last 8760 hours. HbA1C: No results for input(s): HGBA1C in the last 72 hours. CBG: Recent Labs  Lab 09/06/20 1151 09/06/20 1633 09/06/20 2124 09/07/20 0729 09/07/20 1121  GLUCAP 142* 158* 119* 112* 136*   Lipid Profile: No results for input(s): CHOL, HDL, LDLCALC, TRIG, CHOLHDL, LDLDIRECT in the last 72 hours. Thyroid Function Tests: No results for input(s): TSH, T4TOTAL, FREET4, T3FREE, THYROIDAB in the last 72 hours. Anemia Panel: No results for input(s): VITAMINB12, FOLATE, FERRITIN, TIBC, IRON, RETICCTPCT in the last 72 hours. Sepsis Labs: No results for input(s): PROCALCITON, LATICACIDVEN in the  last 168 hours.  Recent Results (from the past 240 hour(s))  Resp Panel by RT-PCR (Flu A&B, Covid) Nasopharyngeal Swab     Status: None   Collection Time: 09/01/20  8:28 PM   Specimen: Nasopharyngeal Swab; Nasopharyngeal(NP) swabs in vial transport medium  Result Value Ref Range Status   SARS Coronavirus 2 by RT PCR NEGATIVE NEGATIVE Final    Comment: (NOTE) SARS-CoV-2 target nucleic acids are NOT DETECTED.  The SARS-CoV-2 RNA is generally detectable in upper respiratory specimens during the acute phase of infection. The lowest concentration of SARS-CoV-2 viral copies this assay can detect is 138 copies/mL. A negative result does not preclude SARS-Cov-2 infection and should not be used as the sole basis for treatment or other patient management decisions. A negative result may occur with  improper specimen collection/handling, submission of specimen other than nasopharyngeal swab, presence of viral mutation(s) within the areas targeted by this assay, and inadequate number of viral copies(<138 copies/mL). A negative result must be combined with clinical observations, patient history, and epidemiological information. The expected result is Negative.  Fact Sheet for Patients:  EntrepreneurPulse.com.au  Fact Sheet for Healthcare Providers:  IncredibleEmployment.be  This test is no t yet approved or cleared by the [place] FDA and  has been authorized for detection and/or diagnosis of SARS-CoV-2 by FDA under an Emergency Use Authorization (EUA). This EUA will remain  in effect (meaning this test can be used) for the duration of the COVID-19 declaration under Section 564(b)(1) of the Act, 21 U.S.C.section 360bbb-3(b)(1), unless the authorization is terminated  or revoked sooner.       Influenza A by PCR NEGATIVE NEGATIVE Final   Influenza B by PCR NEGATIVE NEGATIVE Final    Comment: (NOTE) The Xpert Xpress SARS-CoV-2/FLU/RSV plus assay is  intended as an aid in the diagnosis of influenza from Nasopharyngeal swab specimens and should not be used as a sole basis for treatment. Nasal washings and aspirates are unacceptable for Xpert Xpress SARS-CoV-2/FLU/RSV testing.  Fact Sheet for Patients: EntrepreneurPulse.com.au  Fact Sheet for Healthcare Providers: IncredibleEmployment.be  This test is not yet approved or cleared by the [place] FDA and has been authorized for detection and/or diagnosis of SARS-CoV-2 by FDA under an Emergency Use Authorization (  EUA). This EUA will remain in effect (meaning this test can be used) for the duration of the COVID-19 declaration under Section 564(b)(1) of the Act, 21 U.S.C. section 360bbb-3(b)(1), unless the authorization is terminated or revoked.  Performed at [facility], [place] [address] [place], [place] [address]   Surgical pcr screen     Status: None   Collection Time: 09/04/20 12:10 AM   Specimen: Nasal Mucosa; Nasal Swab  Result Value Ref Range Status   MRSA, PCR NEGATIVE NEGATIVE Final   Staphylococcus aureus NEGATIVE NEGATIVE Final    Comment: (NOTE) The Xpert SA Assay (FDA approved for NASAL specimens in patients 36 years of age and older), is one component of a comprehensive surveillance program. It is not intended to diagnose infection nor to guide or monitor treatment. Performed at [facility], [place] [address] [place], [place] [address]     Radiology Studies: No results found. Scheduled Meds: . apixaban  2.5 mg Oral BID  . docusate sodium  100 mg Oral BID  . escitalopram  20 mg Oral Daily  . feeding supplement  237 mL Oral BID BM  . insulin aspart  0-5 Units Subcutaneous QHS  . insulin aspart  0-9 Units Subcutaneous TID WC  . lisinopril  2.5 mg Oral Daily  . melatonin  10 mg Oral QHS  . memantine  5 mg Oral BID  . polyethylene glycol  17 g Oral Daily  . pyridostigmine  60 mg Oral  Daily  . senna  1 tablet Oral BID   Continuous Infusions: . methocarbamol (ROBAXIN) IV       LOS: 6 days    Time spent: 25 mins    Shawna Clamp, MD [facility]   If 7PM-7AM, please contact night-coverage

## 2020-09-07 NOTE — Plan of Care (Signed)
  Problem: Health Behavior/Discharge Planning: Goal: Ability to manage health-related needs will improve Outcome: Progressing   Problem: Clinical Measurements: Goal: Diagnostic test results will improve Outcome: Progressing   Problem: Activity: Goal: Risk for activity intolerance will decrease Outcome: Progressing   Problem: Elimination: Goal: Will not experience complications related to bowel motility Outcome: Progressing

## 2020-09-08 LAB — BASIC METABOLIC PANEL
Anion gap: 6 (ref 5–15)
BUN: 18 mg/dL (ref 8–23)
CO2: 29 mmol/L (ref 22–32)
Calcium: 8.5 mg/dL — ABNORMAL LOW (ref 8.9–10.3)
Chloride: 103 mmol/L (ref 98–111)
Creatinine, Ser: 0.55 mg/dL (ref 0.44–1.00)
GFR, Estimated: >60 mL/min (ref >60)
Glucose, Bld: 96 mg/dL (ref 70–99)
Potassium: 4.4 mmol/L (ref 3.5–5.1)
Sodium: 138 mmol/L (ref 135–145)

## 2020-09-08 LAB — GLUCOSE, CAPILLARY
Glucose-Capillary: 101 mg/dL — ABNORMAL HIGH (ref 70–99)
Glucose-Capillary: 141 mg/dL — ABNORMAL HIGH (ref 70–99)
Glucose-Capillary: 127 mg/dL — ABNORMAL HIGH (ref 70–99)
Glucose-Capillary: 130 mg/dL — ABNORMAL HIGH (ref 70–99)

## 2020-09-08 LAB — CBC
HCT: 30.6 % — ABNORMAL LOW (ref 36.0–46.0)
Hemoglobin: 9.7 g/dL — ABNORMAL LOW (ref 12.0–15.0)
MCH: 30.3 pg (ref 26.0–34.0)
MCHC: 31.7 g/dL (ref 30.0–36.0)
MCV: 95.6 fL (ref 80.0–100.0)
Platelets: 127 10*3/uL — ABNORMAL LOW (ref 150–400)
RBC: 3.2 MIL/uL — ABNORMAL LOW (ref 3.87–5.11)
RDW: 13.9 % (ref 11.5–15.5)
WBC: 7.6 10*3/uL (ref 4.0–10.5)
nRBC: 0 % (ref 0.0–0.2)

## 2020-09-08 LAB — RESP PANEL BY RT-PCR (FLU A&B, COVID) ARPGX2
Influenza A by PCR: NEGATIVE
Influenza B by PCR: NEGATIVE
SARS Coronavirus 2 by RT PCR: NEGATIVE

## 2020-09-08 MED ORDER — LISINOPRIL 5 MG PO TABS
5.0000 mg | ORAL_TABLET | Freq: Every day | ORAL | Status: DC
  Administered 2020-09-08 – 2020-09-09 (×2): 5 mg via ORAL
  Filled 2020-09-08 (×2): qty 1

## 2020-09-08 NOTE — Progress Notes (Signed)
Results for MICHAEL, VENTRESCA (MRN [id number]) as of 09/08/2020 11:59  Ref. Range 09/08/2020 09:44  RESP PANEL BY RT-PCR (FLU A&B, COVID) [id number] Unknown Rpt  Influenza A By PCR Latest Ref Range: NEGATIVE  NEGATIVE  Influenza B By PCR Latest Ref Range: NEGATIVE  NEGATIVE  SARS Coronavirus 2 by RT PCR Latest Ref Range: NEGATIVE  NEGATIVE     MD made aware. SRP, RN

## 2020-09-08 NOTE — Care Management Important Message (Signed)
Medicare IM given to the patient by Analyce Tavares. 

## 2020-09-08 NOTE — Progress Notes (Signed)
Physical Therapy Treatment Patient Details Name: [name] [name] MRN: [id number] DOB: Mar 16, 1932 Today's Date: 09/08/2020    History of Present Illness Patient is an 87 year old female s/p R hip hemiarthroplasty anterior approach after fall at facility. PMH includes paroxysmal atrial fibrillation, hypertension, CAD, COPD, dementia    PT Comments    Patient resting in bed. Before PT bgan any mobility, patient  Stated"" My foot hurts. Do something about it." Assessed the left foot and ankle, patient indicates that the lateral aspect of foot is painful when  Performing PROM and palpating around the ankle, top and lateral foot.[place] Kitchen Unsure of exact location as patient unable to be specific. Patient did quickly stand and pivot to recliner with some weight bearing briefly.  Continue PT for mobility.  Follow Up Recommendations  SNF     Equipment Recommendations  Wheelchair cushion (measurements PT);Wheelchair (measurements PT)    Recommendations for Other Services       Precautions / Restrictions Precautions Precautions: Fall Precaution Comments: right foot is now painful Restrictions RLE Weight Bearing: Weight bearing as tolerated    Mobility  Bed Mobility Overal bed mobility: Needs Assistance Bed Mobility: Supine to Sit     Supine to sit: Max assist     General bed mobility comments: assist with legs and with trunk, bed  pad used to scoot forward. patient  did make an effort to scoot to bed edge    Transfers Overall transfer level: Needs assistance Equipment used: Rolling walker (2 wheeled) Transfers: Sit to/from Stand Sit to Stand: Max assist Stand pivot transfers: Max assist       General transfer comment: Attempted multiple times too stand at Rw, patient very anxious and resistive. Resorted to bear hug stnad and pivot, using bed pad at  hips to lift to stnad. Patient was able to beqr weight long enough to pivot. Did not stnad long enough to assess WB  on left  afoot.  Ambulation/Gait             General Gait Details: unable   Stairs             Wheelchair Mobility    Modified Rankin (Stroke Patients Only)       Balance Overall balance assessment: Needs assistance;History of Falls Sitting-balance support: Feet supported Sitting balance-Leahy Scale: Fair   Postural control: Posterior lean Standing balance support: Bilateral upper extremity supported Standing balance-Leahy Scale: Poor                              Cognition Arousal/Alertness: Awake/alert Behavior During Therapy: Anxious Overall Cognitive Status: History of cognitive impairments - at baseline                                 General Comments: able to follow simple commands with increased time      Exercises      General Comments        Pertinent Vitals/Pain Faces Pain Scale: Hurts whole lot Pain Location: right foot, esp. when  checking ROM Pain Descriptors / Indicators: Grimacing;Guarding;Moaning;Discomfort Pain Intervention(s): Limited activity within patient's tolerance;Monitored during session    Home Living                      Prior Function            PT Goals (current goals can now be  found in the care plan section) Progress towards PT goals: Progressing toward goals    Frequency    Min 2X/week      PT Plan Current plan remains appropriate;Frequency needs to be updated    Co-evaluation              AM-PAC PT "6 Clicks" Mobility   Outcome Measure  Help needed turning from your back to your side while in a flat bed without using bedrails?: Total Help needed moving from lying on your back to sitting on the side of a flat bed without using bedrails?: Total Help needed moving to and from a bed to a chair (including a wheelchair)?: Total Help needed standing up from a chair using your arms (e.g., wheelchair or bedside chair)?: Total Help needed to walk in hospital room?: Total Help  needed climbing 3-5 steps with a railing? : Total 6 Click Score: 6    End of Session Equipment Utilized During Treatment: Gait belt Activity Tolerance: Patient limited by pain Patient left: in chair;with call bell/phone within reach;with chair alarm set;with nursing/sitter in room Nurse Communication: Mobility status PT Visit Diagnosis: Difficulty in walking, not elsewhere classified (R26.2)     Time: 1219-7588 PT Time Calculation (min) (ACUTE ONLY): 26 min  Charges:  $Therapeutic Activity: 23-37 mins                     [place] Pager [phone] Office [phone]    Claretha Cooper 09/08/2020, 9:47 AM

## 2020-09-08 NOTE — Progress Notes (Signed)
PROGRESS NOTE    [name] [name]  ZOX:[id number] DOB: Dec 22, 1931 DOA: 09/01/2020 PCP: Marin Olp, MD   Brief Narrative: This 87 y.o.femalewith PMH significant forparoxysmal atrial fibrillation, hypertension, CAD, COPD, dementia who presents after an unwitnessed fall at facility. Patient was found down on the ground on her right side with a small skin tear of her right elbow. She complained of pain in her right hip and groin region. Patient does not remember the fall and is not sure of what happened. Patient cannot provide any history secondary to her dementia. No family or staff from facility are present. She is found to have a right femoral neck fracture on x-ray. CT of her head was negative.She is anticoagulated on Eliquis chronically for A. Fib.  Patient was admitted for Right femoral neck fracture. Ortho consulted , she underwent open reduction internal fixation on 5/26.  Tolerated well postoperative day 4.  PT recommended skilled nursing facility for rehab.  Assessment & Plan:   Principal Problem:   Fracture of femoral neck, right (HCC) Active Problems:   Essential hypertension   Paroxysmal atrial fibrillation (HCC)   Pacemaker -CRT-[employer]   Dementia (HCC)   Current use of long term anticoagulation   Chronic obstructive pulmonary disease (HCC)   Closed hip fracture, right, initial encounter ([place])   Closed fracture of right femur, unspecified fracture morphology, initial encounter ([place])   Displaced Right Femoral Neck Fracture  Unwitnessed Fall. She presented with unwitnessed fall at [facility] care unit. CT C-spine/ head without acute intracranial pathology. No fracture or static subluxation of cervical spine.  Right knee x-ray without acute abnormality. R hip CT with acute mildly displaced and angulated R femoral neck fracture, moderate R hip osteoarthritis. Discussed with [name] (daughter), she's aware of her higher risk.  Given hx HF,  Echo completed  showed reduced EF.  Prior to injury, she ambulated without cane/walker, was able to dress, needed some assistance with toileting (lived at [facility]).   Continue scheduled apap, oxy prn, morphine.  Miralax.  Continue PT post op Patient underwent ORIF on 5/26.  Tolerated well.  Postoperative day 4. Continue PT and OT therapy   Atrial Fibrillation  S/p Pacemaker Placement Chadsvasc at least 5 (age, F, HTN, CAD - echo shows EF 35 to 40%., borderline). Given high chadsvasc, was on heparin while having surgery - heparin per pharmacy Resumed Eliquis after 24- 48  hours after ORIF.  CAD Not currently on antiplatelets or statin.  HFpEF  Mildly Reduced EF Echo 2019 with EF 45-50%, grade III diastolic dysfunction. Repeat echo shows reduced EF 35 to 40%. Cardio consulted, Appears euvolemic at this time.  Dementia Delirium precautions. Allow family to stay overnight to help avoid delirium. Continue home namenda, melatonin, lexapro.  Prolonged QTc Avoid QT prolonging meds. Hold home prn haldol. Continue lexapro for now   History of autonomic dysfunction? Continue pyridostigmine  Hypertension Continue lisinopril.  suspect related to pain /agitation / discomfort.  Thrombocytopenia Chronic.  Continue to monitor   DVT prophylaxis: Eliquis Code Status: DNR Family Communication: No family at bed side. Disposition Plan:   Status is: Inpatient  Remains inpatient appropriate because:Inpatient level of care appropriate due to severity of illness   Dispo: The patient is from: [facility].              Anticipated d/c is to: SNF in 1-2 days.              Patient currently is medically  stable for dc   Difficult to place patient No   Consultants:   Ortho  Procedures:  ORIF 5/26.  Antimicrobials:  Anti-infectives (From admission, onward)   Start     Dose/Rate Route Frequency Ordered Stop   09/04/20 2100  ceFAZolin (ANCEF) IVPB 2g/100 mL premix        2  g 200 mL/hr over 30 Minutes Intravenous Every 6 hours 09/04/20 1805 09/05/20 0342   09/04/20 1400  ceFAZolin (ANCEF) IVPB 2g/100 mL premix        2 g 200 mL/hr over 30 Minutes Intravenous On call to O.R. 09/03/20 1857 09/04/20 1530     Subjective: Patient was seen and examined at bedside.  Overnight events noted.   She was sitting comfortably in the chair,  denies any pain.  She is s/p ORIF 5/26.  Postoperative day 4. Denies any other concerns.  Objective: Vitals:   09/07/20 0608 09/07/20 2150 09/08/20 0607 09/08/20 1311  BP: (!) 157/54 (!) 142/66 (!) 162/78 (!) 141/52  Pulse: 70 70 70 70  Resp: 20 18 20 16   Temp: 99.7 F (37.6 C) 99.5 F (37.5 C) 99.8 F (37.7 C) 98.7 F (37.1 C)  TempSrc: Oral Oral Oral Oral  SpO2: 97% 96% 98% 97%  Weight:      Height:        Intake/Output Summary (Last 24 hours) at 09/08/2020 1446 Last data filed at 09/08/2020 0420 Gross per 24 hour  Intake --  Output 300 ml  Net -300 ml   Filed Weights   09/02/20 1603 09/04/20 1432  Weight: 58.6 kg 58.6 kg    Examination:  General exam: Appears calm and comfortable , not in any acute distress. Respiratory system: Clear to auscultation. Respiratory effort normal. Cardiovascular system: S1 & S2 heard, RRR. No JVD, murmurs, rubs, gallops or clicks. No pedal edema. Gastrointestinal system: Abdomen is nondistended, soft and nontender. No organomegaly or masses felt.  Normal bowel sounds heard. Central nervous system: Alert and oriented. No focal neurological deficits. Extremities: Right thigh tenderness+, able to stand up and walk with walker. Skin: No rashes, lesions or ulcers Psychiatry: Judgement and insight appear normal. Mood & affect appropriate.     Data Reviewed: I have personally reviewed following labs and imaging studies  CBC: Recent Labs  Lab 09/01/20 1951 09/02/20 0714 09/03/20 0211 09/04/20 0334 09/05/20 0343 09/06/20 0153 09/07/20 0320 09/08/20 0325  WBC 9.1   < > 8.8  8.2 8.7 7.4 7.7 7.6  NEUTROABS 7.6  --  6.6  --   --   --   --   --   HGB 12.7   < > 12.2 11.6* 9.9* 9.8* 9.1* 9.7*  HCT 39.5   < > 38.7 37.5 31.6* 31.1* 29.0* 30.6*  MCV 94.3   < > 96.8 96.9 96.3 96.9 95.1 95.6  PLT 120*   < > 99* 88* 85* 105* 119* 127*   < > = values in this interval not displayed.   Basic Metabolic Panel: Recent Labs  Lab 09/03/20 0211 09/04/20 0334 09/05/20 0343 09/06/20 0153 09/08/20 0325  NA 142 141 140 142 138  K 3.9 3.3* 3.7 3.4* 4.4  CL 105 101 104 103 103  CO2 29 31 26 31 29   GLUCOSE 120* 112* 147* 105* 96  BUN 20 20 27* 27* 18  CREATININE 0.87 0.76 0.75 0.68 0.55  CALCIUM 8.9 8.6* 8.1* 8.3* 8.5*  MG 2.0 2.0  --   --   --   PHOS  4.0 3.1  --   --   --    GFR: Estimated Creatinine Clearance: 46.7 mL/min (by C-G formula based on SCr of 0.55 mg/dL). Liver Function Tests: Recent Labs  Lab 09/01/20 1951 09/03/20 0211  AST 21 34  ALT 11 23  ALKPHOS 56 57  BILITOT 0.8 1.3*  PROT 6.4* 6.0*  ALBUMIN 4.0 3.7   No results for input(s): LIPASE, AMYLASE in the last 168 hours. No results for input(s): AMMONIA in the last 168 hours. Coagulation Profile: Recent Labs  Lab 09/01/20 1951 09/02/20 1711  INR 1.2 1.1   Cardiac Enzymes: No results for input(s): CKTOTAL, CKMB, CKMBINDEX, TROPONINI in the last 168 hours. BNP (last 3 results) No results for input(s): PROBNP in the last 8760 hours. HbA1C: No results for input(s): HGBA1C in the last 72 hours. CBG: Recent Labs  Lab 09/07/20 1121 09/07/20 1613 09/07/20 2159 09/08/20 0732 09/08/20 1059  GLUCAP 136* 103* 120* 101* 141*   Lipid Profile: No results for input(s): CHOL, HDL, LDLCALC, TRIG, CHOLHDL, LDLDIRECT in the last 72 hours. Thyroid Function Tests: No results for input(s): TSH, T4TOTAL, FREET4, T3FREE, THYROIDAB in the last 72 hours. Anemia Panel: No results for input(s): VITAMINB12, FOLATE, FERRITIN, TIBC, IRON, RETICCTPCT in the last 72 hours. Sepsis Labs: No results for input(s):  PROCALCITON, LATICACIDVEN in the last 168 hours.  Recent Results (from the past 240 hour(s))  Resp Panel by RT-PCR (Flu A&B, Covid) Nasopharyngeal Swab     Status: None   Collection Time: 09/01/20  8:28 PM   Specimen: Nasopharyngeal Swab; Nasopharyngeal(NP) swabs in vial transport medium  Result Value Ref Range Status   SARS Coronavirus 2 by RT PCR NEGATIVE NEGATIVE Final    Comment: (NOTE) SARS-CoV-2 target nucleic acids are NOT DETECTED.  The SARS-CoV-2 RNA is generally detectable in upper respiratory specimens during the acute phase of infection. The lowest concentration of SARS-CoV-2 viral copies this assay can detect is 138 copies/mL. A negative result does not preclude SARS-Cov-2 infection and should not be used as the sole basis for treatment or other patient management decisions. A negative result may occur with  improper specimen collection/handling, submission of specimen other than nasopharyngeal swab, presence of viral mutation(s) within the areas targeted by this assay, and inadequate number of viral copies(<138 copies/mL). A negative result must be combined with clinical observations, patient history, and epidemiological information. The expected result is Negative.  Fact Sheet for Patients:  EntrepreneurPulse.com.au  Fact Sheet for Healthcare Providers:  IncredibleEmployment.be  This test is no t yet approved or cleared by the [place] FDA and  has been authorized for detection and/or diagnosis of SARS-CoV-2 by FDA under an Emergency Use Authorization (EUA). This EUA will remain  in effect (meaning this test can be used) for the duration of the COVID-19 declaration under Section 564(b)(1) of the Act, 21 U.S.C.section 360bbb-3(b)(1), unless the authorization is terminated  or revoked sooner.       Influenza A by PCR NEGATIVE NEGATIVE Final   Influenza B by PCR NEGATIVE NEGATIVE Final    Comment: (NOTE) The Xpert Xpress  SARS-CoV-2/FLU/RSV plus assay is intended as an aid in the diagnosis of influenza from Nasopharyngeal swab specimens and should not be used as a sole basis for treatment. Nasal washings and aspirates are unacceptable for Xpert Xpress SARS-CoV-2/FLU/RSV testing.  Fact Sheet for Patients: EntrepreneurPulse.com.au  Fact Sheet for Healthcare Providers: IncredibleEmployment.be  This test is not yet approved or cleared by the [place] and has been authorized  for detection and/or diagnosis of SARS-CoV-2 by FDA under an Emergency Use Authorization (EUA). This EUA will remain in effect (meaning this test can be used) for the duration of the COVID-19 declaration under Section 564(b)(1) of the Act, 21 U.S.C. section 360bbb-3(b)(1), unless the authorization is terminated or revoked.  Performed at [facility], [place] [address] [place], [place] [address]   Surgical pcr screen     Status: None   Collection Time: 09/04/20 12:10 AM   Specimen: Nasal Mucosa; Nasal Swab  Result Value Ref Range Status   MRSA, PCR NEGATIVE NEGATIVE Final   Staphylococcus aureus NEGATIVE NEGATIVE Final    Comment: (NOTE) The Xpert SA Assay (FDA approved for NASAL specimens in patients 31 years of age and older), is one component of a comprehensive surveillance program. It is not intended to diagnose infection nor to guide or monitor treatment. Performed at [facility], [place] [address], [place], [place] [address]   Resp Panel by RT-PCR (Flu A&B, Covid) Nasopharyngeal Swab     Status: None   Collection Time: 09/08/20  9:44 AM   Specimen: Nasopharyngeal Swab; Nasopharyngeal(NP) swabs in vial transport medium  Result Value Ref Range Status   SARS Coronavirus 2 by RT PCR NEGATIVE NEGATIVE Final    Comment: (NOTE) SARS-CoV-2 target nucleic acids are NOT DETECTED.  The SARS-CoV-2 RNA is generally detectable in upper  respiratory specimens during the acute phase of infection. The lowest concentration of SARS-CoV-2 viral copies this assay can detect is 138 copies/mL. A negative result does not preclude SARS-Cov-2 infection and should not be used as the sole basis for treatment or other patient management decisions. A negative result may occur with  improper specimen collection/handling, submission of specimen other than nasopharyngeal swab, presence of viral mutation(s) within the areas targeted by this assay, and inadequate number of viral copies(<138 copies/mL). A negative result must be combined with clinical observations, patient history, and epidemiological information. The expected result is Negative.  Fact Sheet for Patients:  EntrepreneurPulse.com.au  Fact Sheet for Healthcare Providers:  IncredibleEmployment.be  This test is no t yet approved or cleared by the [place] FDA and  has been authorized for detection and/or diagnosis of SARS-CoV-2 by FDA under an Emergency Use Authorization (EUA). This EUA will remain  in effect (meaning this test can be used) for the duration of the COVID-19 declaration under Section 564(b)(1) of the Act, 21 U.S.C.section 360bbb-3(b)(1), unless the authorization is terminated  or revoked sooner.       Influenza A by PCR NEGATIVE NEGATIVE Final   Influenza B by PCR NEGATIVE NEGATIVE Final    Comment: (NOTE) The Xpert Xpress SARS-CoV-2/FLU/RSV plus assay is intended as an aid in the diagnosis of influenza from Nasopharyngeal swab specimens and should not be used as a sole basis for treatment. Nasal washings and aspirates are unacceptable for Xpert Xpress SARS-CoV-2/FLU/RSV testing.  Fact Sheet for Patients: EntrepreneurPulse.com.au  Fact Sheet for Healthcare Providers: IncredibleEmployment.be  This test is not yet approved or cleared by the [place] FDA and has been  authorized for detection and/or diagnosis of SARS-CoV-2 by FDA under an Emergency Use Authorization (EUA). This EUA will remain in effect (meaning this test can be used) for the duration of the COVID-19 declaration under Section 564(b)(1) of the Act, 21 U.S.C. section 360bbb-3(b)(1), unless the authorization is terminated or revoked.  Performed at [facility], [place] [address], [place], [place] [address]     Radiology Studies: No results  found. Scheduled Meds: . apixaban  2.5 mg Oral BID  . docusate sodium  100 mg Oral BID  . escitalopram  20 mg Oral Daily  . feeding supplement  237 mL Oral BID BM  . insulin aspart  0-5 Units Subcutaneous QHS  . insulin aspart  0-9 Units Subcutaneous TID WC  . lisinopril  5 mg Oral Daily  . melatonin  10 mg Oral QHS  . memantine  5 mg Oral BID  . polyethylene glycol  17 g Oral Daily  . pyridostigmine  60 mg Oral Daily  . senna  1 tablet Oral BID   Continuous Infusions: . methocarbamol (ROBAXIN) IV       LOS: 7 days    Time spent: 25 mins    Shawna Clamp, MD [facility]   If 7PM-7AM, please contact night-coverage

## 2020-09-08 NOTE — Plan of Care (Signed)
  Problem: Health Behavior/Discharge Planning: Goal: Ability to manage health-related needs will improve Outcome: Progressing   Problem: Clinical Measurements: Goal: Ability to maintain clinical measurements within normal limits will improve Outcome: Progressing   

## 2020-09-08 NOTE — Progress Notes (Signed)
PPE discontinued, all results are negative '

## 2020-09-08 NOTE — TOC Progression Note (Signed)
Transition of Care [facility]) - Progression Note   Patient Details  Name: [name] [name] MRN: [id number] Date of Birth: 1931/10/25  [facility]) CM/SW [place], LCSW Phone Number: 09/08/2020, 12:14 PM  Clinical Narrative: CSW called patient's daughter, [name], to follow up regarding SNF bed choice. Per daughter, she is declining [place], [place], and [facility] leaving only [place]. CSW started insurance authorization for SNF on the [employer] portal. Clinicals uploaded; reference # is [id number]. TOC to follow.  Expected Discharge Plan: [place] Barriers to Discharge: Continued Medical Work up  Expected Discharge Plan and Services Expected Discharge Plan: [place] In-house Referral: Clinical Social Work Post Acute Care Choice: [place] Living arrangements for the past 2 months: [place] (Memory care)  Readmission Risk Interventions No flowsheet data found.

## 2020-09-09 LAB — GLUCOSE, CAPILLARY
Glucose-Capillary: 121 mg/dL — ABNORMAL HIGH (ref 70–99)
Glucose-Capillary: 139 mg/dL — ABNORMAL HIGH (ref 70–99)

## 2020-09-09 MED ORDER — LISINOPRIL 5 MG PO TABS: 5.0000 mg | ORAL_TABLET | Freq: Every day | ORAL | 0 refills | Status: DC

## 2020-09-09 NOTE — Discharge Instructions (Signed)
Advised to follow-up with primary care physician in 1 week. Advised to follow-up with her orthopedic surgeon in 1 week. Advised to continue physical therapy at nursing home. Advised to increase lisinopril 5 mg daily.      Dr. Rod Can Joint Replacement Specialist [facility] [address] [place], [place] [address] [phone]   TOTAL HIP REPLACEMENT POSTOPERATIVE DIRECTIONS    Hip Rehabilitation, Guidelines Following Surgery   WEIGHT BEARING Weight bearing as tolerated with assist device (walker, cane, etc) as directed, use it as long as suggested by your surgeon or therapist, typically at least 4-6 weeks.  The results of a hip operation are greatly improved after range of motion and muscle strengthening exercises. Follow all safety measures which are given to protect your hip. If any of these exercises cause increased pain or swelling in your joint, decrease the amount until you are comfortable again. Then slowly increase the exercises. Call your caregiver if you have problems or questions.   HOME CARE INSTRUCTIONS  Most of the following instructions are designed to prevent the dislocation of your new hip.  . Remove items at home which could result in a fall. This includes throw rugs or furniture in walking pathways.  . Continue medications as instructed at time of discharge.  You may have some home medications which will be placed on hold until you complete the course of blood thinner medication.  You may start showering once you are discharged home. Do not remove your dressing. . Do not put on socks or shoes without following the instructions of your caregivers.   . Sit on chairs with arms. Use the chair arms to help push yourself up when arising.  . Arrange for the use of a toilet seat elevator so you are not sitting low.   Walk with walker as instructed.  . You may resume a sexual relationship in one month or when given the OK by your  caregiver.  Marland Kitchen Use walker as long as suggested by your caregivers.  . You may put full weight on your legs and walk as much as is comfortable. . Avoid periods of inactivity such as sitting longer than an hour when not asleep. This helps prevent blood clots.  Dennis Bast may return to work once you are cleared by Engineer, production.  . Do not drive a car for 6 weeks or until released by your surgeon.  . Do not drive while taking narcotics.  . Wear elastic stockings for two weeks following surgery during the day but you may remove then at night.  . Make sure you keep all of your appointments after your operation with all of your doctors and caregivers. You should call the office at the above phone number and make an appointment for approximately two weeks after the date of your surgery. . Please pick up a stool softener and laxative for home use as long as you are requiring pain medications.  ICE to the affected hip every three hours for 30 minutes at a time and then as needed for pain and swelling. Continue to use ice on the hip for pain and swelling from surgery. You may notice swelling that will progress down to the foot and ankle.  This is normal after surgery.  Elevate the leg when you are not up walking on it.   . It is important for you to complete the blood thinner medication as prescribed by your doctor.  Continue to use the breathing machine which  will help keep your temperature down.  It is common for your temperature to cycle up and down following surgery, especially at night when you are not up moving around and exerting yourself.  The breathing machine keeps your lungs expanded and your temperature down.  RANGE OF MOTION AND STRENGTHENING EXERCISES  These exercises are designed to help you keep full movement of your hip joint. Follow your caregiver's or physical therapist's instructions. Perform all exercises about fifteen times, three times per day or as directed. Exercise both hips, even if you  have had only one joint replacement. These exercises can be done on a training (exercise) mat, on the floor, on a table or on a bed. Use whatever works the best and is most comfortable for you. Use music or television while you are exercising so that the exercises are a pleasant break in your day. This will make your life better with the exercises acting as a break in routine you can look forward to.  . Lying on your back, slowly slide your foot toward your buttocks, raising your knee up off the floor. Then slowly slide your foot back down until your leg is straight again.  . Lying on your back spread your legs as far apart as you can without causing discomfort.  . Lying on your side, raise your upper leg and foot straight up from the floor as far as is comfortable. Slowly lower the leg and repeat.  . Lying on your back, tighten up the muscle in the front of your thigh (quadriceps muscles). You can do this by keeping your leg straight and trying to raise your heel off the floor. This helps strengthen the largest muscle supporting your knee.  . Lying on your back, tighten up the muscles of your buttocks both with the legs straight and with the knee bent at a comfortable angle while keeping your heel on the floor.   SKILLED REHAB INSTRUCTIONS: If the patient is transferred to a skilled rehab facility following release from the hospital, a list of the current medications will be sent to the facility for the patient to continue.  When discharged from the skilled rehab facility, please have the facility set up the patient's [place] prior to being released. Also, the skilled facility will be responsible for providing the patient with their medications at time of release from the facility to include their pain medication and their blood thinner medication. If the patient is still at the rehab facility at time of the two week follow up appointment, the skilled rehab facility will also need to  assist the patient in arranging follow up appointment in our office and any transportation needs.  POST-OPERATIVE OPIOID TAPER INSTRUCTIONS: . It is important to wean off of your opioid medication as soon as possible. If you do not need pain medication after your surgery it is ok to stop day one. Marland Kitchen Opioids include: o Codeine, Hydrocodone(Norco, Vicodin), Oxycodone(Percocet, oxycontin) and hydromorphone amongst others.  . Long term and even short term use of opiods can cause: o Increased pain response o Dependence o Constipation o Depression o Respiratory depression o And more.  . Withdrawal symptoms can include o Flu like symptoms o Nausea, vomiting o And more . Techniques to manage these symptoms o Hydrate well o Eat regular healthy meals o Stay active o Use relaxation techniques(deep breathing, meditating, yoga) . Do Not substitute Alcohol to help with tapering . If you have been on opioids for less than  two weeks and do not have pain than it is ok to stop all together.  . Plan to wean off of opioids o This plan should start within one week post op of your joint replacement. o Maintain the same interval or time between taking each dose and first decrease the dose.  o Cut the total daily intake of opioids by one tablet each day o Next start to increase the time between doses. o The last dose that should be eliminated is the evening dose.      MAKE SURE YOU:  . Understand these instructions.  . Will watch your condition.  . Will get help right away if you are not doing well or get worse.  Pick up stool softner and laxative for home use following surgery while on pain medications. Do not remove your dressing. The dressing is waterproof--it is OK to take showers. Continue to use ice for pain and swelling after surgery. Do not use any lotions or creams on the incision until instructed by your surgeon. Total Hip Protocol.

## 2020-09-09 NOTE — Plan of Care (Signed)
  Problem: Health Behavior/Discharge Planning: Goal: Ability to manage health-related needs will improve Outcome: Progressing   Problem: Clinical Measurements: Goal: Ability to maintain clinical measurements within normal limits will improve Outcome: Progressing Goal: Will remain free from infection Outcome: Progressing   

## 2020-09-09 NOTE — Progress Notes (Signed)
Called report to Nurse Tamala Julian, LPN, [facility]; ppt prepared for transport. Pt clothing sent along with wedding ring and band, tape placed around---pt ring finger on left hand and ring set sent with pt. SRP, RN

## 2020-09-09 NOTE — TOC Transition Note (Signed)
Transition of Care [facility]) - CM/SW Discharge Note   Patient Details  Name: [name] [name] MRN: [id number] Date of Birth: November 30, 1931  [facility] of [facility]) CM/SW Contact:  Ross Ludwig, LCSW Phone Number: 09/09/2020, 12:45 PM   Clinical Narrative:     Patient to be d/c'ed today to [facility] room 1201P.  Patient and family agreeable to plans will transport via ems RN to call report to [phone].  Patient's daughter is aware that patient is discharging today.  Final next level of care: [place] Barriers to Discharge: Barriers Resolved   Patient Goals and CMS Choice Patient states their goals for this hospitalization and ongoing recovery are:: To go to SNF for short term rehab, then return back to [facility] memory care ALF. CMS Medicare.gov Compare Post Acute Care list provided to:: Patient Represenative (must comment) Choice offered to / list presented to : Adult Children  Discharge Placement  SNF for short term rehab. Existing PASRR number confirmed : 09/03/20          Patient chooses bed at: [facility] Patient to be transferred to facility by: [facility] Name of family member notified: Patient's daughter [name] was made aware by talking to community social worker Hassan Rowan from [facility] Patient and family notified of of transfer: 09/09/20  Discharge Plan and Services In-house Referral: Clinical Social Work   Post Acute Care Choice: [place]                               Social Determinants of Health (SDOH) Interventions     Readmission Risk Interventions No flowsheet data found.

## 2020-09-09 NOTE — Discharge Summary (Signed)
Physician Discharge Summary  [name] [name] QVZ:[id number] DOB: Feb 17, 1932 DOA: 09/01/2020  PCP: Marin Olp, MD  Admit date: 09/01/2020   Discharge date: 09/09/2020  Admitted From: Home.  Disposition: SNF ( [facility])  Recommendations for Outpatient Follow-up:  1. Follow up with PCP in 1-2 weeks. 2. Please obtain BMP/CBC in one week. 3. Advised to follow-up with her orthopedic surgeon in 1 week. 4. Advised to continue physical therapy at nursing home. 5. Advised to increase lisinopril 5 mg daily.  Home Health: None Equipment/Devices: None  Discharge Condition: Stable CODE STATUS:DNR Diet recommendation: Heart Healthy  Brief Summary : This 87 y.o.femalewith PMH significant forparoxysmal atrial fibrillation, hypertension, CAD, COPD, dementia who presented in the ED after an unwitnessed fall at facility. Patient was found down on the ground on her right side with a small skin tear of her right elbow. She complained of pain in her right hip and groin region. Patient does not remember the fall and is not sure of what happened. Patient cannot provide any history secondary to her dementia. No family or staff from facility are present. She is found to have a right femoral neck fracture on x-ray. CT of her head was negative. She is anticoagulated on Eliquis chronically for A. Fib.  Hospital Course: Patient was admitted for Right femoral neck fracture.Ortho was consulted , she underwent open reduction internal fixation on 5/26.  Tolerated well,  postoperative day 5.  Postoperative course has been appropriate and without complications. PT recommended skilled nursing facility for rehab.  Patient is being discharged to [facility] for rehab.  Patient was advised weightbearing as tolerated.  Patient will follow up with orthopedics in 1 week.  Her blood pressure medication lisinopril was increased from 2.5 to 5 mg daily for better blood pressure control.  She was managed for below  problems in the hospital.  Discharge Diagnoses:  Principal Problem:   Fracture of femoral neck, right (HCC) Active Problems:   Essential hypertension   Paroxysmal atrial fibrillation (HCC)   Pacemaker -CRT-St[employer] Jude   Dementia ([place])   Current use of long term anticoagulation   Chronic obstructive pulmonary disease (HCC)   Closed hip fracture, right, initial encounter ([place])   Closed fracture of right femur, unspecified fracture morphology, initial encounter ([place])  Displaced Right Femoral Neck Fracture  Unwitnessed Fall. She presented with unwitnessed fall at [facility] care unit. CT C-spine/ head without acute intracranial pathology. No fracture or static subluxation of cervical spine.  Right knee x-ray without acute abnormality. R hip CT with acute mildly displaced and angulated R femoral neck fracture, moderate R hip osteoarthritis. Discussed with [name] (daughter), she's aware of her higher risk. Given hx HF,  Echo completed showed reduced EF. Prior to injury, she ambulated without cane/walker, was able to dress, needed some assistance with toileting (lived at [facility]).  Continue scheduled apap, oxy prn, morphine. Miralax.  Continue PT post op Patient underwent ORIF on 5/26.  Tolerated well.  Postoperative day 4. Continue PT and OT therapy   Atrial Fibrillation  S/p Pacemaker Placement Chadsvasc at least 5 (age, F, HTN, CAD - echo shows EF 35 to 40%., borderline). Given high chadsvasc, was on heparin while having surgery - heparin per pharmacy Resumed Eliquis after 24- 48  hours after ORIF.  CAD Not currently on antiplatelets or statin.  HFpEF  Mildly Reduced EF Echo 2019 with EF 45-50%, grade III diastolic dysfunction. Repeat echo shows reduced EF 35 to 40%. Cardio consulted, Appears euvolemic  at this time.  Dementia Delirium precautions. Allow family to stay overnight to help avoid delirium. Continue home namenda, melatonin,  lexapro.  Prolonged QTc Avoid QT prolonging meds. Hold home prn haldol. Continue lexapro for now  History of autonomic dysfunction? Continue pyridostigmine  Hypertension Continue lisinopril.  suspect related to pain /agitation / discomfort.  Thrombocytopenia Chronic.  Continue to monitor   Discharge Instructions  Discharge Instructions    Call MD for:  persistant dizziness or light-headedness   Complete by: As directed    Call MD for:  persistant nausea and vomiting   Complete by: As directed    Call MD for:  severe uncontrolled pain   Complete by: As directed    Diet - low sodium heart healthy   Complete by: As directed    Diet Carb Modified   Complete by: As directed    Discharge instructions   Complete by: As directed    Advised to follow-up with primary care physician in 1 week. Advised to follow-up with her orthopedic surgeon in 1 week. Advised to continue physical therapy at nursing home. Advised to increase lisinopril 5 mg daily.   Discharge wound care:   Complete by: As directed    Follow-up orthopedics for wound care.   Increase activity slowly   Complete by: As directed      Allergies as of 09/09/2020      Reactions   Aricept [donepezil] Shortness Of Breath, Other (See Comments)   Hallucinations and loss of sensation in legs   Nitroglycerin Other (See Comments)   Oral spray form causes rapid drop in blood pressure.   sensitive to tabs which causes rapid drop in blood pressure. Is ok to use oral spray form      Medication List    STOP taking these medications   traMADol 50 MG tablet Commonly known as: ULTRAM     TAKE these medications   Eliquis 5 MG Tabs tablet Generic drug: apixaban TAKE 1 TABLET(5 MG) BY MOUTH TWICE DAILY   escitalopram 20 MG tablet Commonly known as: LEXAPRO TAKE 1 TABLET(20 MG) BY MOUTH DAILY   haloperidol 0.5 MG tablet Commonly known as: HALDOL Take 1 tablet (0.5 mg total) by mouth 2 (two) times daily as needed  for agitation (separate by at least 6 hours).   HYDROcodone-acetaminophen 7.5-325 MG tablet Commonly known as: NORCO Take 1-2 tablets by mouth every 4 (four) hours as needed for up to 7 days for severe pain (pain score 7-10).   lisinopril 5 MG tablet Commonly known as: ZESTRIL Take 1 tablet (5 mg total) by mouth daily. Start taking on: September 10, 2020 What changed:   medication strength  See the new instructions.   Melatonin 10 MG Caps Take 10 mg by mouth at bedtime.   memantine 5 MG tablet Commonly known as: NAMENDA Take 5 mg by mouth 2 (two) times daily.   pyridostigmine 60 MG tablet Commonly known as: MESTINON TAKE 1 TABLET(60 MG) BY MOUTH DAILY   TYLENOL 500 MG tablet Generic drug: acetaminophen Take 1,000 mg by mouth every 8 (eight) hours as needed for fever, moderate pain or mild pain.            Discharge Care Instructions  (From admission, onward)         Start     Ordered   09/09/20 0000  Discharge wound care:       Comments: Follow-up orthopedics for wound care.   09/09/20 1039  Contact information for follow-up providers    Swinteck, Aaron Edelman, MD. Schedule an appointment as soon as possible for a visit in 2 weeks.   Specialty: Orthopedic Surgery Why: For wound re-check Contact information: [address] [place] [address] [phone]        Marin Olp, MD Follow up in 1 week(s).   Specialty: Family Medicine Contact information: [place] [address] [phone]        Deboraha Sprang, MD .   Specialty: Cardiology Contact information: [phone] [place] [address] [phone]            Contact information for after-discharge care    Destination    [facility] .   Service: Skilled Nursing Contact information: [place] [address] [phone]                 Allergies  Allergen Reactions  .  Aricept [Donepezil] Shortness Of Breath and Other (See Comments)    Hallucinations and loss of sensation in legs  . Nitroglycerin Other (See Comments)    Oral spray form causes rapid drop in blood pressure.   sensitive to tabs which causes rapid drop in blood pressure. Is ok to use oral spray form    Consultations:  Orthopaedics.   Procedures/Studies: DG Chest 1 View  Result Date: 09/01/2020 CLINICAL DATA:  Fall. EXAM: CHEST  1 VIEW COMPARISON:  07/31/2019 FINDINGS: Mild cardiomegaly. Mild aortic tortuosity. Post median sternotomy. Left-sided pacemaker in place. Mild vascular congestion without pulmonary edema. Subsegmental left lung base atelectasis. No confluent consolidation, pleural effusion or pneumothorax. No acute osseous abnormalities are seen. IMPRESSION: 1. Mild cardiomegaly with vascular congestion. 2. Subsegmental left lung base atelectasis. Electronically Signed   By: Keith Rake M.D.   On: 09/01/2020 20:11   CT Head Wo Contrast  Result Date: 09/01/2020 CLINICAL DATA:  Fall EXAM: CT HEAD WITHOUT CONTRAST CT CERVICAL SPINE WITHOUT CONTRAST TECHNIQUE: Multidetector CT imaging of the head and cervical spine was performed following the standard protocol without intravenous contrast. Multiplanar CT image reconstructions of the cervical spine were also generated. COMPARISON:  None. FINDINGS: CT HEAD FINDINGS Brain: No evidence of acute infarction, hemorrhage, hydrocephalus, extra-axial collection or mass lesion/mass effect. Periventricular and deep white matter hypodensity. Mild global cerebral volume loss. Vascular: No hyperdense vessel or unexpected calcification. Skull: Normal. Negative for fracture or focal lesion. Sinuses/Orbits: No acute finding. Other: None. CT CERVICAL SPINE FINDINGS Alignment: Degenerative straightening and reversal of the normal cervical lordosis. Skull base and vertebrae: No acute fracture. No primary bone lesion or focal pathologic process. Soft tissues and  spinal canal: No prevertebral fluid or swelling. No visible canal hematoma. Disc levels: Moderate multilevel disc space height loss and osteophytosis. Upper chest: Negative. Other: None. IMPRESSION: 1. No acute intracranial pathology. Small-vessel white matter disease and mild global cerebral volume loss. 2. No fracture or static subluxation of the cervical spine. 3. Moderate multilevel disc space height loss and osteophytosis. Electronically Signed   By: Eddie Candle M.D.   On: 09/01/2020 21:27   CT Cervical Spine Wo Contrast  Result Date: 09/01/2020 CLINICAL DATA:  Fall EXAM: CT HEAD WITHOUT CONTRAST CT CERVICAL SPINE WITHOUT CONTRAST TECHNIQUE: Multidetector CT imaging of the head and cervical spine was performed following the standard protocol without intravenous contrast. Multiplanar CT image reconstructions of the cervical spine were also generated. COMPARISON:  None. FINDINGS: CT HEAD FINDINGS Brain: No evidence of acute  infarction, hemorrhage, hydrocephalus, extra-axial collection or mass lesion/mass effect. Periventricular and deep white matter hypodensity. Mild global cerebral volume loss. Vascular: No hyperdense vessel or unexpected calcification. Skull: Normal. Negative for fracture or focal lesion. Sinuses/Orbits: No acute finding. Other: None. CT CERVICAL SPINE FINDINGS Alignment: Degenerative straightening and reversal of the normal cervical lordosis. Skull base and vertebrae: No acute fracture. No primary bone lesion or focal pathologic process. Soft tissues and spinal canal: No prevertebral fluid or swelling. No visible canal hematoma. Disc levels: Moderate multilevel disc space height loss and osteophytosis. Upper chest: Negative. Other: None. IMPRESSION: 1. No acute intracranial pathology. Small-vessel white matter disease and mild global cerebral volume loss. 2. No fracture or static subluxation of the cervical spine. 3. Moderate multilevel disc space height loss and osteophytosis.  Electronically Signed   By: Eddie Candle M.D.   On: 09/01/2020 21:27   Pelvis Portable  Result Date: 09/04/2020 CLINICAL DATA:  Hip replacement EXAM: PORTABLE PELVIS 1-2 VIEWS COMPARISON:  09/04/2020, 09/01/2020 FINDINGS: Interval right hip replacement with intact hardware and normal alignment. Gas in the soft tissues consistent with recent surgery. Pelvic sidewall clips. IMPRESSION: Interval right hip replacement with expected postsurgical change Electronically Signed   By: Donavan Foil M.D.   On: 09/04/2020 17:59   CT HIP RIGHT WO CONTRAST  Result Date: 09/02/2020 CLINICAL DATA:  Right hip fracture EXAM: CT OF THE RIGHT HIP WITHOUT CONTRAST TECHNIQUE: Multidetector CT imaging of the right hip was performed according to the standard protocol. Multiplanar CT image reconstructions were also generated. COMPARISON:  X-ray 09/01/2020 FINDINGS: Bones/Joint/Cartilage Acute transcervical right femoral neck fracture with varus and anterior apex angulation. Distal fracture component is superiorly displaced. Fracture does not appear to involve the intertrochanteric region. No fracture extension into the femoral head. Hip joint is intact without dislocation. Moderate right hip osteoarthritis. Small right hip joint effusion. Bones appear diffusely demineralized. The visualized portion of the right hemipelvis appears intact. Ligaments Suboptimally assessed by CT. Muscles and Tendons Enthesopathic changes at the greater trochanter. No acute musculotendinous injury by CT. Soft tissues Soft tissue induration within the subcutaneous soft tissues overlying the lateral aspect of the greater trochanter. No organized hematoma. No inguinal lymphadenopathy. Scattered vascular calcifications. IMPRESSION: 1. Acute mildly displaced and angulated right femoral neck fracture. 2. Moderate right hip osteoarthritis. 3. Diffuse osseous demineralization. Electronically Signed   By: Davina Poke D.O.   On: 09/02/2020 10:42   DG Knee  Complete 4 Views Right  Result Date: 09/02/2020 CLINICAL DATA:  History of recent falls, initial encounter EXAM: RIGHT KNEE - COMPLETE 4+ VIEW COMPARISON:  07/17/2012 FINDINGS: Meniscal calcifications are noted. Mild medial joint space narrowing is noted. No joint effusion is seen. No acute fracture or dislocation is noted. Mild patellofemoral degenerative changes are noted as well. IMPRESSION: Degenerative change without acute abnormality. Electronically Signed   By: Inez Catalina M.D.   On: 09/02/2020 09:17   DG C-Arm 1-60 Min-No Report  Result Date: 09/04/2020 Fluoroscopy was utilized by the requesting physician.  No radiographic interpretation.   ECHOCARDIOGRAM COMPLETE  Result Date: 09/03/2020    ECHOCARDIOGRAM REPORT   Patient Name:   ELIDE STALZER Date of Exam: 09/02/2020 Medical Rec #:  [id number]     Height:       66.0 in Accession #:    [id number]    Weight:       129.2 lb Date of Birth:  May 15, 1931     BSA:          1.661  m Patient Age:    5 years      BP:           172/70 mmHg Patient Gender: F             HR:           70 bpm. Exam Location:  Inpatient Procedure: 2D Echo, Cardiac Doppler and Color Doppler Indications:    I50.9* Heart failure (unspecified)  History:        Patient has no prior history of Echocardiogram examinations and                 Patient has prior history of Echocardiogram examinations, most                 recent 04/16/2017.  Sonographer:    Merrie Roof RDCS Referring Phys: [place]  1. Left ventricular ejection fraction, by estimation, is 35 to 40%. The left ventricle has moderately decreased function. The left ventricle demonstrates global hypokinesis. Left ventricular diastolic parameters are consistent with Grade II diastolic dysfunction (pseudonormalization).  2. Right ventricular systolic function is normal. The right ventricular size is normal. There is moderately elevated pulmonary artery systolic pressure. The estimated right  ventricular systolic pressure is 56.4 mmHg.  3. Left atrial size was moderately dilated.  4. Right atrial size was moderately dilated.  5. The mitral valve is normal in structure. Mild mitral valve regurgitation. No evidence of mitral stenosis.  6. Tricuspid valve regurgitation is moderate to severe.  7. The aortic valve is normal in structure. There is moderate calcification of the aortic valve. There is mild thickening of the aortic valve. Aortic valve regurgitation is mild. Mild to moderate aortic valve sclerosis/calcification is present, without any evidence of aortic stenosis.  8. The inferior vena cava is dilated in size with <50% respiratory variability, suggesting right atrial pressure of 15 mmHg. FINDINGS  Left Ventricle: Left ventricular ejection fraction, by estimation, is 35 to 40%. The left ventricle has moderately decreased function. The left ventricle demonstrates global hypokinesis. The left ventricular internal cavity size was normal in size. There is no left ventricular hypertrophy. Left ventricular diastolic parameters are consistent with Grade II diastolic dysfunction (pseudonormalization). Right Ventricle: The right ventricular size is normal. No increase in right ventricular wall thickness. Right ventricular systolic function is normal. There is moderately elevated pulmonary artery systolic pressure. The tricuspid regurgitant velocity is 3.77 m/s, and with an assumed right atrial pressure of 3 mmHg, the estimated right ventricular systolic pressure is 33.2 mmHg. Left Atrium: Left atrial size was moderately dilated. Right Atrium: Right atrial size was moderately dilated. Pericardium: There is no evidence of pericardial effusion. Mitral Valve: The mitral valve is normal in structure. Mild mitral valve regurgitation. No evidence of mitral valve stenosis. Tricuspid Valve: The tricuspid valve is normal in structure. Tricuspid valve regurgitation is moderate to severe. No evidence of tricuspid  stenosis. Aortic Valve: The aortic valve is normal in structure. There is moderate calcification of the aortic valve. There is mild thickening of the aortic valve. Aortic valve regurgitation is mild. Mild to moderate aortic valve sclerosis/calcification is present, without any evidence of aortic stenosis. Aortic valve mean gradient measures 3.0 mmHg. Aortic valve peak gradient measures 5.7 mmHg. Aortic valve area, by VTI measures 2.37 cm. Pulmonic Valve: The pulmonic valve was normal in structure. Pulmonic valve regurgitation is mild. No evidence of pulmonic stenosis. Aorta: The aortic root is normal in size and structure. Venous: The inferior  vena cava is dilated in size with less than 50% respiratory variability, suggesting right atrial pressure of 15 mmHg. IAS/Shunts: No atrial level shunt detected by color flow Doppler. Additional Comments: A device lead is visualized.  LEFT VENTRICLE PLAX 2D LVIDd:         5.04 cm LVIDs:         3.88 cm LV PW:         0.82 cm LV IVS:        0.94 cm LVOT diam:     2.00 cm LV SV:         46 LV SV Index:   28 LVOT Area:     3.14 cm  RIGHT VENTRICLE RV Basal diam:  3.91 cm RV S prime:     9.68 cm/s TAPSE (M-mode): 1.4 cm LEFT ATRIUM             Index       RIGHT ATRIUM           Index LA diam:        4.40 cm 2.65 cm/m  RA Area:     23.90 cm LA Vol (A2C):   66.4 ml 39.98 ml/m RA Volume:   77.30 ml  46.54 ml/m LA Vol (A4C):   83.8 ml 50.46 ml/m LA Biplane Vol: 77.4 ml 46.60 ml/m  AORTIC VALVE AV Area (Vmax):    2.40 cm AV Area (Vmean):   2.14 cm AV Area (VTI):     2.37 cm AV Vmax:           119.00 cm/s AV Vmean:          74.300 cm/s AV VTI:            0.195 m AV Peak Grad:      5.7 mmHg AV Mean Grad:      3.0 mmHg LVOT Vmax:         90.90 cm/s LVOT Vmean:        50.500 cm/s LVOT VTI:          0.147 m LVOT/AV VTI ratio: 0.75  AORTA Ao Root diam: 3.50 cm TRICUSPID VALVE TR Peak grad:   56.9 mmHg TR Vmax:        377.00 cm/s  SHUNTS Systemic VTI:  0.15 m Systemic Diam: 2.00 cm  Candee Furbish MD Electronically signed by Candee Furbish MD Signature Date/Time: 09/03/2020/7:30:36 AM    Final    DG HIP OPERATIVE UNILAT W OR W/O PELVIS RIGHT  Result Date: 09/04/2020 CLINICAL DATA:  Right hip replacement EXAM: OPERATIVE RIGHT HIP WITH PELVIS COMPARISON:  09/02/2020 FLUOROSCOPY TIME:  Radiation Exposure Index (as provided by the fluoroscopic device): 0.46 mGy If the device does not provide the exposure index: Fluoroscopy Time:  5 seconds Number of Acquired Images:  2 FINDINGS: Right hemiarthroplasty is noted in satisfactory position. No bony or soft tissue abnormality is noted. IMPRESSION: Status post right hip hemiarthroplasty Electronically Signed   By: Inez Catalina M.D.   On: 09/04/2020 16:31   DG Hip Unilat  With Pelvis 2-3 Views Right  Result Date: 09/01/2020 CLINICAL DATA:  Pain after fall. EXAM: DG HIP (WITH OR WITHOUT PELVIS) 2-3V RIGHT COMPARISON:  None. FINDINGS: Displaced right femoral neck fracture with proximal migration of the femoral shaft. The femoral head remains seated. Pubic rami are intact. Pubic symphysis and sacroiliac joints are congruent. Surgical clips in the pelvis. IMPRESSION: Displaced right femoral neck fracture. Electronically Signed   By: Aurther Loft.D.  On: 09/01/2020 20:10   Open reduction internal fixation.   Subjective: Patient was seen and examined at bedside.  She is sitting comfortably on the bed,  denies any pain.   Patient feels better and being discharged to skilled nursing facility for rehab.  Discharge Exam: Vitals:   09/08/20 2112 09/09/20 0606  BP: (!) 125/55 (!) 138/51  Pulse: 70 70  Resp: 19 (!) 21  Temp: 99.6 F (37.6 C) 98.8 F (37.1 C)  SpO2: 95% 94%   Vitals:   09/08/20 0607 09/08/20 1311 09/08/20 2112 09/09/20 0606  BP: (!) 162/78 (!) 141/52 (!) 125/55 (!) 138/51  Pulse: 70 70 70 70  Resp: 20 16 19  (!) 21  Temp: 99.8 F (37.7 C) 98.7 F (37.1 C) 99.6 F (37.6 C) 98.8 F (37.1 C)  TempSrc: Oral Oral Oral  Oral  SpO2: 98% 97% 95% 94%  Weight:      Height:        General: Pt is alert, awake, not in acute distress Cardiovascular: RRR, S1/S2 +, no rubs, no gallops Respiratory: CTA bilaterally, no wheezing, no rhonchi Abdominal: Soft, NT, ND, bowel sounds + Extremities:  No tenderness, no edema, no cyanosis    The results of significant diagnostics from this hospitalization (including imaging, microbiology, ancillary and laboratory) are listed below for reference.     Microbiology: Recent Results (from the past 240 hour(s))  Resp Panel by RT-PCR (Flu A&B, Covid) Nasopharyngeal Swab     Status: None   Collection Time: 09/01/20  8:28 PM   Specimen: Nasopharyngeal Swab; Nasopharyngeal(NP) swabs in vial transport medium  Result Value Ref Range Status   SARS Coronavirus 2 by RT PCR NEGATIVE NEGATIVE Final    Comment: (NOTE) SARS-CoV-2 target nucleic acids are NOT DETECTED.  The SARS-CoV-2 RNA is generally detectable in upper respiratory specimens during the acute phase of infection. The lowest concentration of SARS-CoV-2 viral copies this assay can detect is 138 copies/mL. A negative result does not preclude SARS-Cov-2 infection and should not be used as the sole basis for treatment or other patient management decisions. A negative result may occur with  improper specimen collection/handling, submission of specimen other than nasopharyngeal swab, presence of viral mutation(s) within the areas targeted by this assay, and inadequate number of viral copies(<138 copies/mL). A negative result must be combined with clinical observations, patient history, and epidemiological information. The expected result is Negative.  Fact Sheet for Patients:  EntrepreneurPulse.com.au  Fact Sheet for Healthcare Providers:  IncredibleEmployment.be  This test is no t yet approved or cleared by the [place] FDA and  has been authorized for detection and/or  diagnosis of SARS-CoV-2 by FDA under an Emergency Use Authorization (EUA). This EUA will remain  in effect (meaning this test can be used) for the duration of the COVID-19 declaration under Section 564(b)(1) of the Act, 21 U.S.C.section 360bbb-3(b)(1), unless the authorization is terminated  or revoked sooner.       Influenza A by PCR NEGATIVE NEGATIVE Final   Influenza B by PCR NEGATIVE NEGATIVE Final    Comment: (NOTE) The Xpert Xpress SARS-CoV-2/FLU/RSV plus assay is intended as an aid in the diagnosis of influenza from Nasopharyngeal swab specimens and should not be used as a sole basis for treatment. Nasal washings and aspirates are unacceptable for Xpert Xpress SARS-CoV-2/FLU/RSV testing.  Fact Sheet for Patients: EntrepreneurPulse.com.au  Fact Sheet for Healthcare Providers: IncredibleEmployment.be  This test is not yet approved or cleared by the [place] and has been authorized  for detection and/or diagnosis of SARS-CoV-2 by FDA under an Emergency Use Authorization (EUA). This EUA will remain in effect (meaning this test can be used) for the duration of the COVID-19 declaration under Section 564(b)(1) of the Act, 21 U.S.C. section 360bbb-3(b)(1), unless the authorization is terminated or revoked.  Performed at [facility], [place] [address] [place], [place] [address]   Surgical pcr screen     Status: None   Collection Time: 09/04/20 12:10 AM   Specimen: Nasal Mucosa; Nasal Swab  Result Value Ref Range Status   MRSA, PCR NEGATIVE NEGATIVE Final   Staphylococcus aureus NEGATIVE NEGATIVE Final    Comment: (NOTE) The Xpert SA Assay (FDA approved for NASAL specimens in patients 46 years of age and older), is one component of a comprehensive surveillance program. It is not intended to diagnose infection nor to guide or monitor treatment. Performed at [facility], [place] [address], [place], [place] [address]   Resp Panel by RT-PCR (Flu A&B, Covid) Nasopharyngeal Swab     Status: None   Collection Time: 09/08/20  9:44 AM   Specimen: Nasopharyngeal Swab; Nasopharyngeal(NP) swabs in vial transport medium  Result Value Ref Range Status   SARS Coronavirus 2 by RT PCR NEGATIVE NEGATIVE Final    Comment: (NOTE) SARS-CoV-2 target nucleic acids are NOT DETECTED.  The SARS-CoV-2 RNA is generally detectable in upper respiratory specimens during the acute phase of infection. The lowest concentration of SARS-CoV-2 viral copies this assay can detect is 138 copies/mL. A negative result does not preclude SARS-Cov-2 infection and should not be used as the sole basis for treatment or other patient management decisions. A negative result may occur with  improper specimen collection/handling, submission of specimen other than nasopharyngeal swab, presence of viral mutation(s) within the areas targeted by this assay, and inadequate number of viral copies(<138 copies/mL). A negative result must be combined with clinical observations, patient history, and epidemiological information. The expected result is Negative.  Fact Sheet for Patients:  EntrepreneurPulse.com.au  Fact Sheet for Healthcare Providers:  IncredibleEmployment.be  This test is no t yet approved or cleared by the [place] FDA and  has been authorized for detection and/or diagnosis of SARS-CoV-2 by FDA under an Emergency Use Authorization (EUA). This EUA will remain  in effect (meaning this test can be used) for the duration of the COVID-19 declaration under Section 564(b)(1) of the Act, 21 U.S.C.section 360bbb-3(b)(1), unless the authorization is terminated  or revoked sooner.       Influenza A by PCR NEGATIVE NEGATIVE Final   Influenza B by PCR NEGATIVE NEGATIVE Final    Comment: (NOTE) The Xpert Xpress SARS-CoV-2/FLU/RSV plus assay is intended as an aid in the  diagnosis of influenza from Nasopharyngeal swab specimens and should not be used as a sole basis for treatment. Nasal washings and aspirates are unacceptable for Xpert Xpress SARS-CoV-2/FLU/RSV testing.  Fact Sheet for Patients: EntrepreneurPulse.com.au  Fact Sheet for Healthcare Providers: IncredibleEmployment.be  This test is not yet approved or cleared by the [place] FDA and has been authorized for detection and/or diagnosis of SARS-CoV-2 by FDA under an Emergency Use Authorization (EUA). This EUA will remain in effect (meaning this test can be used) for the duration of the COVID-19 declaration under Section 564(b)(1) of the Act, 21 U.S.C. section 360bbb-3(b)(1), unless the authorization is terminated or revoked.  Performed at [facility], [place] [address] [place], [place] [address]      Labs: BNP (last  3 results) No results for input(s): BNP in the last 8760 hours. Basic Metabolic Panel: Recent Labs  Lab 09/03/20 0211 09/04/20 0334 09/05/20 0343 09/06/20 0153 09/08/20 0325  NA 142 141 140 142 138  K 3.9 3.3* 3.7 3.4* 4.4  CL 105 101 104 103 103  CO2 29 31 26 31 29   GLUCOSE 120* 112* 147* 105* 96  BUN 20 20 27* 27* 18  CREATININE 0.87 0.76 0.75 0.68 0.55  CALCIUM 8.9 8.6* 8.1* 8.3* 8.5*  MG 2.0 2.0  --   --   --   PHOS 4.0 3.1  --   --   --    Liver Function Tests: Recent Labs  Lab 09/03/20 0211  AST 34  ALT 23  ALKPHOS 57  BILITOT 1.3*  PROT 6.0*  ALBUMIN 3.7   No results for input(s): LIPASE, AMYLASE in the last 168 hours. No results for input(s): AMMONIA in the last 168 hours. CBC: Recent Labs  Lab 09/03/20 0211 09/04/20 0334 09/05/20 0343 09/06/20 0153 09/07/20 0320 09/08/20 0325  WBC 8.8 8.2 8.7 7.4 7.7 7.6  NEUTROABS 6.6  --   --   --   --   --   HGB 12.2 11.6* 9.9* 9.8* 9.1* 9.7*  HCT 38.7 37.5 31.6* 31.1* 29.0* 30.6*  MCV 96.8 96.9 96.3 96.9 95.1 95.6  PLT 99* 88* 85* 105*  119* 127*   Cardiac Enzymes: No results for input(s): CKTOTAL, CKMB, CKMBINDEX, TROPONINI in the last 168 hours. BNP: Invalid input(s): POCBNP CBG: Recent Labs  Lab 09/08/20 1059 09/08/20 1625 09/08/20 2109 09/09/20 0819 09/09/20 1138  GLUCAP 141* 127* 130* 121* 139*   D-Dimer No results for input(s): DDIMER in the last 72 hours. Hgb A1c No results for input(s): HGBA1C in the last 72 hours. Lipid Profile No results for input(s): CHOL, HDL, LDLCALC, TRIG, CHOLHDL, LDLDIRECT in the last 72 hours. Thyroid function studies No results for input(s): TSH, T4TOTAL, T3FREE, THYROIDAB in the last 72 hours.  Invalid input(s): FREET3 Anemia work up No results for input(s): VITAMINB12, FOLATE, FERRITIN, TIBC, IRON, RETICCTPCT in the last 72 hours. Urinalysis    Component Value Date/Time   COLORURINE YELLOW 12/09/2017 1626   APPEARANCEUR CLEAR 12/09/2017 1626   LABSPEC 1.026 12/09/2017 1626   PHURINE 5.5 12/09/2017 1626   GLUCOSEU NEGATIVE 12/09/2017 1626   GLUCOSEU NEGATIVE 12/29/2016 1558   HGBUR NEGATIVE 12/09/2017 1626   BILIRUBINUR Positive 08/17/2019 1456   KETONESUR NEGATIVE 12/09/2017 1626   PROTEINUR Positive (A) 08/17/2019 1456   PROTEINUR NEGATIVE 12/09/2017 1626   UROBILINOGEN 0.2 08/17/2019 1456   UROBILINOGEN 0.2 12/29/2016 1558   NITRITE Negative 08/17/2019 1456   NITRITE NEGATIVE 12/09/2017 1626   LEUKOCYTESUR Negative 08/17/2019 1456   Sepsis Labs Invalid input(s): PROCALCITONIN,  WBC,  LACTICIDVEN Microbiology Recent Results (from the past 240 hour(s))  Resp Panel by RT-PCR (Flu A&B, Covid) Nasopharyngeal Swab     Status: None   Collection Time: 09/01/20  8:28 PM   Specimen: Nasopharyngeal Swab; Nasopharyngeal(NP) swabs in vial transport medium  Result Value Ref Range Status   SARS Coronavirus 2 by RT PCR NEGATIVE NEGATIVE Final    Comment: (NOTE) SARS-CoV-2 target nucleic acids are NOT DETECTED.  The SARS-CoV-2 RNA is generally detectable in upper  respiratory specimens during the acute phase of infection. The lowest concentration of SARS-CoV-2 viral copies this assay can detect is 138 copies/mL. A negative result does not preclude SARS-Cov-2 infection and should not be used as the sole basis for treatment  or other patient management decisions. A negative result may occur with  improper specimen collection/handling, submission of specimen other than nasopharyngeal swab, presence of viral mutation(s) within the areas targeted by this assay, and inadequate number of viral copies(<138 copies/mL). A negative result must be combined with clinical observations, patient history, and epidemiological information. The expected result is Negative.  Fact Sheet for Patients:  EntrepreneurPulse.com.au  Fact Sheet for Healthcare Providers:  IncredibleEmployment.be  This test is no t yet approved or cleared by the [place] FDA and  has been authorized for detection and/or diagnosis of SARS-CoV-2 by FDA under an Emergency Use Authorization (EUA). This EUA will remain  in effect (meaning this test can be used) for the duration of the COVID-19 declaration under Section 564(b)(1) of the Act, 21 U.S.C.section 360bbb-3(b)(1), unless the authorization is terminated  or revoked sooner.       Influenza A by PCR NEGATIVE NEGATIVE Final   Influenza B by PCR NEGATIVE NEGATIVE Final    Comment: (NOTE) The Xpert Xpress SARS-CoV-2/FLU/RSV plus assay is intended as an aid in the diagnosis of influenza from Nasopharyngeal swab specimens and should not be used as a sole basis for treatment. Nasal washings and aspirates are unacceptable for Xpert Xpress SARS-CoV-2/FLU/RSV testing.  Fact Sheet for Patients: EntrepreneurPulse.com.au  Fact Sheet for Healthcare Providers: IncredibleEmployment.be  This test is not yet approved or cleared by the [place] FDA and has been  authorized for detection and/or diagnosis of SARS-CoV-2 by FDA under an Emergency Use Authorization (EUA). This EUA will remain in effect (meaning this test can be used) for the duration of the COVID-19 declaration under Section 564(b)(1) of the Act, 21 U.S.C. section 360bbb-3(b)(1), unless the authorization is terminated or revoked.  Performed at [facility], [place] [address] [place], [place] [address]   Surgical pcr screen     Status: None   Collection Time: 09/04/20 12:10 AM   Specimen: Nasal Mucosa; Nasal Swab  Result Value Ref Range Status   MRSA, PCR NEGATIVE NEGATIVE Final   Staphylococcus aureus NEGATIVE NEGATIVE Final    Comment: (NOTE) The Xpert SA Assay (FDA approved for NASAL specimens in patients 69 years of age and older), is one component of a comprehensive surveillance program. It is not intended to diagnose infection nor to guide or monitor treatment. Performed at [facility], [place] [address], [place], [place] [address]   Resp Panel by RT-PCR (Flu A&B, Covid) Nasopharyngeal Swab     Status: None   Collection Time: 09/08/20  9:44 AM   Specimen: Nasopharyngeal Swab; Nasopharyngeal(NP) swabs in vial transport medium  Result Value Ref Range Status   SARS Coronavirus 2 by RT PCR NEGATIVE NEGATIVE Final    Comment: (NOTE) SARS-CoV-2 target nucleic acids are NOT DETECTED.  The SARS-CoV-2 RNA is generally detectable in upper respiratory specimens during the acute phase of infection. The lowest concentration of SARS-CoV-2 viral copies this assay can detect is 138 copies/mL. A negative result does not preclude SARS-Cov-2 infection and should not be used as the sole basis for treatment or other patient management decisions. A negative result may occur with  improper specimen collection/handling, submission of specimen other than nasopharyngeal swab, presence of viral mutation(s) within the areas targeted by this assay, and inadequate  number of viral copies(<138 copies/mL). A negative result must be combined with clinical observations, patient history, and epidemiological information. The expected result is Negative.  Fact Sheet for Patients:  EntrepreneurPulse.com.au  Fact Sheet for Healthcare Providers:  IncredibleEmployment.be  This test is no t yet approved or cleared by the [place] and  has been authorized for detection and/or diagnosis of SARS-CoV-2 by FDA under an Emergency Use Authorization (EUA). This EUA will remain  in effect (meaning this test can be used) for the duration of the COVID-19 declaration under Section 564(b)(1) of the Act, 21 U.S.C.section 360bbb-3(b)(1), unless the authorization is terminated  or revoked sooner.       Influenza A by PCR NEGATIVE NEGATIVE Final   Influenza B by PCR NEGATIVE NEGATIVE Final    Comment: (NOTE) The Xpert Xpress SARS-CoV-2/FLU/RSV plus assay is intended as an aid in the diagnosis of influenza from Nasopharyngeal swab specimens and should not be used as a sole basis for treatment. Nasal washings and aspirates are unacceptable for Xpert Xpress SARS-CoV-2/FLU/RSV testing.  Fact Sheet for Patients: EntrepreneurPulse.com.au  Fact Sheet for Healthcare Providers: IncredibleEmployment.be  This test is not yet approved or cleared by the [place] FDA and has been authorized for detection and/or diagnosis of SARS-CoV-2 by FDA under an Emergency Use Authorization (EUA). This EUA will remain in effect (meaning this test can be used) for the duration of the COVID-19 declaration under Section 564(b)(1) of the Act, 21 U.S.C. section 360bbb-3(b)(1), unless the authorization is terminated or revoked.  Performed at [facility], [place] [address] [place], [place] [address]      Time coordinating discharge: Over 30 minutes  SIGNED:   Shawna Clamp, MD  [facility] 09/09/2020, 11:52 AM Pager   If 7PM-7AM, please contact night-coverage [email or web address]

## 2020-09-09 NOTE — Plan of Care (Signed)
  Problem: Health Behavior/Discharge Planning: Goal: Ability to manage health-related needs will improve 09/09/2020 1229 by Zadie Rhine, RN Outcome: Adequate for Discharge 09/09/2020 1228 by Zadie Rhine, RN Outcome: Progressing   Problem: Clinical Measurements: Goal: Ability to maintain clinical measurements within normal limits will improve 09/09/2020 1229 by Zadie Rhine, RN Outcome: Adequate for Discharge 09/09/2020 1228 by Zadie Rhine, RN Outcome: Progressing Goal: Will remain free from infection 09/09/2020 1229 by Zadie Rhine, RN Outcome: Adequate for Discharge 09/09/2020 1228 by Zadie Rhine, RN Outcome: Progressing Goal: Diagnostic test results will improve Outcome: Adequate for Discharge Goal: Respiratory complications will improve Outcome: Adequate for Discharge Goal: Cardiovascular complication will be avoided Outcome: Adequate for Discharge   Problem: Activity: Goal: Risk for activity intolerance will decrease Outcome: Adequate for Discharge   Problem: Nutrition: Goal: Adequate nutrition will be maintained Outcome: Adequate for Discharge   Problem: Coping: Goal: Level of anxiety will decrease Outcome: Adequate for Discharge   Problem: Elimination: Goal: Will not experience complications related to bowel motility Outcome: Adequate for Discharge Goal: Will not experience complications related to urinary retention Outcome: Adequate for Discharge   Problem: Pain Managment: Goal: General experience of comfort will improve Outcome: Adequate for Discharge   Problem: Safety: Goal: Ability to remain free from injury will improve Outcome: Adequate for Discharge   Problem: Skin Integrity: Goal: Risk for impaired skin integrity will decrease Outcome: Adequate for Discharge

## 2020-09-26 ENCOUNTER — Telehealth: Payer: Self-pay

## 2020-09-26 NOTE — Telephone Encounter (Signed)
FYI  States patient is wanting to put nursing on hold for right now.  States that patient has high anxiety and can only handle a couple clinicians at a time.    Patient currently has PT in home.

## 2020-09-29 ENCOUNTER — Other Ambulatory Visit: Payer: Self-pay

## 2020-09-29 ENCOUNTER — Emergency Department (HOSPITAL_BASED_OUTPATIENT_CLINIC_OR_DEPARTMENT_OTHER): Payer: Medicare Other | Admitting: Radiology

## 2020-09-29 ENCOUNTER — Encounter (HOSPITAL_BASED_OUTPATIENT_CLINIC_OR_DEPARTMENT_OTHER): Payer: Self-pay | Admitting: Emergency Medicine

## 2020-09-29 ENCOUNTER — Emergency Department (HOSPITAL_BASED_OUTPATIENT_CLINIC_OR_DEPARTMENT_OTHER)
Admission: EM | Admit: 2020-09-29 | Discharge: 2020-09-29 | Disposition: A | Discharge status: Home / Self Care | Payer: Medicare Other | Attending: Emergency Medicine | Admitting: Emergency Medicine

## 2020-09-29 DIAGNOSIS — Z96641 Presence of right artificial hip joint: Secondary | ICD-10-CM | POA: Insufficient documentation

## 2020-09-29 DIAGNOSIS — I5042 Chronic combined systolic (congestive) and diastolic (congestive) heart failure: Secondary | ICD-10-CM | POA: Diagnosis not present

## 2020-09-29 DIAGNOSIS — F0390 Unspecified dementia without behavioral disturbance: Secondary | ICD-10-CM | POA: Insufficient documentation

## 2020-09-29 DIAGNOSIS — Z79899 Other long term (current) drug therapy: Secondary | ICD-10-CM | POA: Insufficient documentation

## 2020-09-29 DIAGNOSIS — J45909 Unspecified asthma, uncomplicated: Secondary | ICD-10-CM | POA: Insufficient documentation

## 2020-09-29 DIAGNOSIS — Z7901 Long term (current) use of anticoagulants: Secondary | ICD-10-CM | POA: Diagnosis not present

## 2020-09-29 DIAGNOSIS — W050XXA Fall from non-moving wheelchair, initial encounter: Secondary | ICD-10-CM | POA: Insufficient documentation

## 2020-09-29 DIAGNOSIS — Z950 Presence of cardiac pacemaker: Secondary | ICD-10-CM | POA: Diagnosis not present

## 2020-09-29 DIAGNOSIS — Z951 Presence of aortocoronary bypass graft: Secondary | ICD-10-CM | POA: Insufficient documentation

## 2020-09-29 DIAGNOSIS — M549 Dorsalgia, unspecified: Secondary | ICD-10-CM | POA: Diagnosis not present

## 2020-09-29 DIAGNOSIS — Z8542 Personal history of malignant neoplasm of other parts of uterus: Secondary | ICD-10-CM | POA: Diagnosis not present

## 2020-09-29 DIAGNOSIS — I110 Hypertensive heart disease with heart failure: Secondary | ICD-10-CM | POA: Diagnosis not present

## 2020-09-29 DIAGNOSIS — J449 Chronic obstructive pulmonary disease, unspecified: Secondary | ICD-10-CM | POA: Diagnosis not present

## 2020-09-29 DIAGNOSIS — M25551 Pain in right hip: Secondary | ICD-10-CM | POA: Diagnosis not present

## 2020-09-29 DIAGNOSIS — Z85820 Personal history of malignant melanoma of skin: Secondary | ICD-10-CM | POA: Insufficient documentation

## 2020-09-29 DIAGNOSIS — I2510 Atherosclerotic heart disease of native coronary artery without angina pectoris: Secondary | ICD-10-CM | POA: Insufficient documentation

## 2020-09-29 DIAGNOSIS — W19XXXA Unspecified fall, initial encounter: Secondary | ICD-10-CM

## 2020-09-29 NOTE — ED Triage Notes (Addendum)
Pt arrives via ems with c/o of sliding out of w/c and landing on her bottom and back pain , pt is w/c  bound good ROM hip and leg  rt.  Hx ofPacemaker  and dementia , pt is on eliquis , did not  hit head witnessed fall , pt from  SNF

## 2020-09-29 NOTE — ED Provider Notes (Signed)
[facility] EMERGENCY DEPT Provider Note   CSN: [id number] Arrival date & time: 09/29/20  1614     History Chief Complaint  Patient presents with   Back Pain   Hip Pain    [name] [name] is a 87 y.o. female.   Back Pain Hip Pain Level 5 caveat due to dementia.  Reportedly brought in for witnessed fall.  Reportedly slid out of wheelchair landed on her rear end.  Reportedly complaining of back pain and hip pain.  For me patient has no complaints.  Did have recent right hip replacement.  He is on anticoagulation.  Reportedly witnessed fall and did not hit head.  Patient denying any complaints at this time.     Past Medical History:  Diagnosis Date   Atrial fibrillation ([place]) 10/06/2007   post op   COPD (chronic obstructive pulmonary disease) ([place])    Emphysema on 02/03/13 CXR   CORONARY ARTERY DISEASE 10/06/2007   a. s/p PCI to LAD; b. s/p CABG; c. LHC 07/13/12: Proximal LAD stent patent, LIMA-LAD atretic, D1 occluded, proximal circumflex 30%, mid RCA occluded, SVG-D1 normal, SVG-OM2 normal, SVG-distal RCA normal, EF 40% with diffuse HK   Dizziness    History of colonic polyps 10/30/2009   No polyps in 2011. No repeat.     HYPERLIPIDEMIA 10/06/2007   HYPERTENSION 10/06/2007   LBBB 09/06/2008   Left renal mass    MELANOMA 09/06/2008   MOES PROCEDURE RIGHT   MYOCARDIAL INFARCTION, HX OF 10/06/2007   NICM (nonischemic cardiomyopathy) ([place])    Echocardiogram 07/12/12: EF 25-30%, diffuse HK, mild AI, mild MR, mild LAE   PERSONAL HX COLONIC POLYPS 10/30/2009   Presence of permanent cardiac pacemaker    Syncope    UTERINE CANCER, HX OF 09/06/2008   VARICOSE VEIN 09/29/2009    Patient Active Problem List   Diagnosis Date Noted   Closed fracture of right femur, unspecified fracture morphology, initial encounter ([place]) 09/02/2020   Fracture of femoral neck, right ([place]) 09/01/2020   Closed hip fracture, right, initial encounter ([place]) 09/01/2020   Chronic obstructive pulmonary  disease ([place]) 03/20/2019   Major depressive disorder with single episode, in partial remission ([place]) 03/20/2019   Arthritis of carpometacarpal ([place]) joint of left thumb 06/02/2017   Current use of long term anticoagulation 06/02/2017   HCAP (healthcare-associated pneumonia) 04/04/2017   Traumatic perinephric hematoma of left kidney 03/29/2017   Acute blood loss as cause of postoperative anemia 03/29/2017   Left renal mass 01/27/2017   Overactive bladder 07/09/2016   Dizziness 05/07/2016   Microscopic hematuria 09/02/2015   CAD (coronary artery disease) 04/17/2014   Allergic rhinitis 04/17/2014   Dementia ([place]) 04/17/2014   Hearing loss 04/17/2014   Pacemaker -CRT-St. Jude 05/21/2013   Atrioventricular block, complete ([place]) 03/08/2013   Paroxysmal atrial fibrillation ([place]) 02/03/2013   Asthma, intrinsic 02/03/2013   Chronic combined systolic and diastolic congestive heart failure ([place]) 10/10/2012   Depression 09/19/2012   Varicose veins 09/29/2009   History of melanoma 09/06/2008   LBBB (left bundle branch block) 09/06/2008   History of uterine cancer 09/06/2008   Hyperlipidemia 10/06/2007   Essential hypertension 10/06/2007   Ischemic cardiomyopathy  EF 30% cath 4/14 with stent 10/06/2007    Past Surgical History:  Procedure Laterality Date   ABDOMINAL HYSTERECTOMY  1988   BI-VENTRICULAR PACEMAKER INSERTION (CRT-P)  02/02/2013   St. Jude, serial no. #[id number]    CARDIAC CATHETERIZATION  08/30/2008   CORONARY ANGIOPLASTY WITH STENT PLACEMENT  2009  CORONARY ARTERY BYPASS GRAFT  2004   IR RADIOLOGIST EVAL & MGMT  02/08/2017   IR RADIOLOGIST EVAL & MGMT  05/26/2017   IR RADIOLOGIST EVAL & MGMT  07/19/2017   IR RADIOLOGIST EVAL & MGMT  05/04/2018   LEFT HEART CATHETERIZATION WITH CORONARY ANGIOGRAM Bilateral 07/13/2012   Procedure: LEFT HEART CATHETERIZATION WITH CORONARY ANGIOGRAM;  Surgeon: Peter M Martinique, MD;  Location: [facility] CATH LAB;  Service: Cardiovascular;  Laterality:  Bilateral;   OOPHORECTOMY     PERMANENT PACEMAKER INSERTION N/A 02/02/2013   Procedure: PERMANENT PACEMAKER INSERTION;  Surgeon: Evans Lance, MD;  Location: [facility] CATH LAB;  Service: Cardiovascular;  Laterality: N/A;   RADIOLOGY WITH ANESTHESIA Left 03/28/2017   Procedure: RENAL CRYO ABLATION;  Surgeon: Aletta Edouard, MD;  Location: [facility] ORS;  Service: Radiology;  Laterality: Left;   right distal pretibeal     melanoma   TOTAL HIP ARTHROPLASTY Right 09/04/2020   Procedure: ANTERIOR TOTAL HIP ARTHROPLASTY;  Surgeon: Rod Can, MD;  Location: [facility] ORS;  Service: Orthopedics;  Laterality: Right;     OB History   No obstetric history on file.     Family History  Problem Relation Age of Onset   Stroke Mother    Heart attack Father    Colon cancer Neg Hx     Social History   Tobacco Use   Smoking status: Never   Smokeless tobacco: Never  Vaping Use   Vaping Use: Never used  Substance Use Topics   Alcohol use: Yes    Comment:  OCC gin or wine   Drug use: No    Home Medications Prior to Admission medications   Medication Sig Start Date End Date Taking? Authorizing Provider  acetaminophen (TYLENOL) 500 MG tablet Take 1,000 mg by mouth every 8 (eight) hours as needed for fever, moderate pain or mild pain.    [provider]  ELIQUIS 5 MG TABS tablet TAKE 1 TABLET(5 MG) BY MOUTH TWICE DAILY 09/25/19   Deboraha Sprang, MD  escitalopram (LEXAPRO) 20 MG tablet TAKE 1 TABLET(20 MG) BY MOUTH DAILY 11/26/19   Marin Olp, MD  haloperidol (HALDOL) 0.5 MG tablet Take 1 tablet (0.5 mg total) by mouth 2 (two) times daily as needed for agitation (separate by at least 6 hours). 07/02/19   Marin Olp, MD  lisinopril (ZESTRIL) 5 MG tablet Take 1 tablet (5 mg total) by mouth daily. 09/10/20   Shawna Clamp, MD  Melatonin 10 MG CAPS Take 10 mg by mouth at bedtime.    [provider]  memantine (NAMENDA) 5 MG tablet Take 5 mg by mouth 2 (two) times daily. 08/22/20    [provider]  pyridostigmine (MESTINON) 60 MG tablet TAKE 1 TABLET(60 MG) BY MOUTH DAILY 08/06/19   Marin Olp, MD    Allergies    Aricept [donepezil] and Nitroglycerin  Review of Systems   Review of Systems  Unable to perform ROS: Dementia  Musculoskeletal:  Positive for back pain.   Physical Exam Updated Vital Signs BP (!) 149/59 (BP Location: Left Arm)   Pulse 70   Temp 98 F (36.7 C) (Oral)   Resp 18   LMP  (LMP Unknown)   SpO2 99%   Physical Exam Vitals and nursing note reviewed.  Constitutional:      Appearance: Normal appearance.  HENT:     Head: Atraumatic.  Eyes:     Extraocular Movements: Extraocular movements intact.     Pupils: Pupils are  equal, round, and reactive to light.  Cardiovascular:     Rate and Rhythm: Normal rate.  Pulmonary:     Effort: Pulmonary effort is normal.     Breath sounds: Normal breath sounds.  Abdominal:     Tenderness: There is no abdominal tenderness.  Musculoskeletal:     Cervical back: Neck supple.     Comments: Mild tenderness to right hip laterally.  Well-healing surgical site.  Good range of motion.  No lumbar tenderness.  No cervical thoracic or lumbar tenderness.  No flank tenderness.  Skin:    General: Skin is warm.     Capillary Refill: Capillary refill takes less than 2 seconds.  Neurological:     Mental Status: She is alert and oriented to person, place, and time.    ED Results / Procedures / Treatments   Labs (all labs ordered are listed, but only abnormal results are displayed) Labs Reviewed - No data to display  EKG None  Radiology DG Hip Unilat W or Wo Pelvis 2-3 Views Right  Result Date: 09/29/2020 CLINICAL DATA:  Recent fall with right hip pain, initial encounter EXAM: DG HIP (WITH OR WITHOUT PELVIS) 3V RIGHT COMPARISON:  None. FINDINGS: Pelvic ring is intact. Right hip replacement is noted and well seated. No soft tissue abnormality is noted. IMPRESSION: Postsurgical changes are noted.   No acute abnormality seen. Electronically Signed   By: Inez Catalina M.D.   On: 09/29/2020 17:07    Procedures Procedures   Medications Ordered in ED Medications - No data to display  ED Course  I have reviewed the triage vital signs and the nursing notes.  Pertinent labs & imaging results that were available during my care of the patient were reviewed by me and considered in my medical decision making (see chart for details).    MDM Rules/Calculators/A&P                          Patient with fall.  No complaints of pain to me but with recent hip surgery x-ray done and reassuring.  Has stable x-ray.  At baseline.  Reportedly did not hit head with witnessed fall.  Discharge home back to nursing home Final Clinical Impression(s) / ED Diagnoses Final diagnoses:  Fall, initial encounter    Rx / DC Orders ED Discharge Orders     None        Davonna Belling, MD 09/29/20 1800

## 2020-09-29 NOTE — Telephone Encounter (Signed)
Noted  

## 2020-09-29 NOTE — ED Notes (Signed)
Pt discharged to home.  Discharge instructions have been discussed with pt and/or family members.  Pt son in law verbally acknowledges understanding of discharge instructions and endorses comprehension to check out at registration prior to leaving.

## 2020-10-22 ENCOUNTER — Telehealth: Payer: Self-pay

## 2020-10-22 NOTE — Telephone Encounter (Signed)
Home Health Certification or Plan of Care Tracking  Is a Certification and Plan of Care  [facility] Agency: [facility]   Order Number:  [id number]  charge sheet has been attached  Where has form been placed:  placed in folder upfront

## 2020-10-23 ENCOUNTER — Encounter: Payer: Self-pay | Admitting: Family Medicine

## 2020-10-23 NOTE — Telephone Encounter (Signed)
Noted  

## 2020-11-04 ENCOUNTER — Telehealth: Payer: Self-pay

## 2020-11-04 NOTE — Telephone Encounter (Signed)
Home Health Certification or Plan of Care Tracking  This a Certification and [place] Agency: [facility]    Order Number:  [id number]   Charge sheet attached  Where has form been placed:  Hunter's basket up front

## 2020-11-04 NOTE — Telephone Encounter (Signed)
Order # [id number] has also been sent around.  Elberta Fortis form [place].  Is requesting both orders to be faxed back to [phone].  If there are any questions please follow back up at [phone].

## 2020-11-04 NOTE — Telephone Encounter (Signed)
Error

## 2020-11-04 NOTE — Telephone Encounter (Signed)
Ria Comment is calling in from [facility], they have overdue orders for home health that they havent received, will be re-faxing them for [place] to fill out.

## 2020-11-05 NOTE — Telephone Encounter (Signed)
Can you take care of this please?? 

## 2020-11-18 NOTE — Telephone Encounter (Signed)
Spoke with [name] [name] they did have everything that they needed and she was discharged yesterday.

## 2020-12-04 ENCOUNTER — Emergency Department (HOSPITAL_COMMUNITY)
Admission: EM | Admit: 2020-12-04 | Discharge: 2020-12-05 | Disposition: A | Discharge status: Home / Self Care | Payer: Medicare Other | Attending: Emergency Medicine | Admitting: Emergency Medicine

## 2020-12-04 ENCOUNTER — Emergency Department (HOSPITAL_COMMUNITY): Payer: Medicare Other

## 2020-12-04 ENCOUNTER — Encounter (HOSPITAL_COMMUNITY): Payer: Self-pay | Admitting: Emergency Medicine

## 2020-12-04 DIAGNOSIS — Z8542 Personal history of malignant neoplasm of other parts of uterus: Secondary | ICD-10-CM | POA: Diagnosis not present

## 2020-12-04 DIAGNOSIS — Z96641 Presence of right artificial hip joint: Secondary | ICD-10-CM | POA: Diagnosis not present

## 2020-12-04 DIAGNOSIS — Z79899 Other long term (current) drug therapy: Secondary | ICD-10-CM | POA: Insufficient documentation

## 2020-12-04 DIAGNOSIS — Z950 Presence of cardiac pacemaker: Secondary | ICD-10-CM | POA: Diagnosis not present

## 2020-12-04 DIAGNOSIS — J449 Chronic obstructive pulmonary disease, unspecified: Secondary | ICD-10-CM | POA: Diagnosis not present

## 2020-12-04 DIAGNOSIS — F0390 Unspecified dementia without behavioral disturbance: Secondary | ICD-10-CM | POA: Insufficient documentation

## 2020-12-04 DIAGNOSIS — Y92129 Unspecified place in nursing home as the place of occurrence of the external cause: Secondary | ICD-10-CM | POA: Insufficient documentation

## 2020-12-04 DIAGNOSIS — I110 Hypertensive heart disease with heart failure: Secondary | ICD-10-CM | POA: Insufficient documentation

## 2020-12-04 DIAGNOSIS — W19XXXA Unspecified fall, initial encounter: Secondary | ICD-10-CM | POA: Insufficient documentation

## 2020-12-04 DIAGNOSIS — Z955 Presence of coronary angioplasty implant and graft: Secondary | ICD-10-CM | POA: Insufficient documentation

## 2020-12-04 DIAGNOSIS — J45909 Unspecified asthma, uncomplicated: Secondary | ICD-10-CM | POA: Diagnosis not present

## 2020-12-04 DIAGNOSIS — M25552 Pain in left hip: Secondary | ICD-10-CM | POA: Diagnosis not present

## 2020-12-04 DIAGNOSIS — R4182 Altered mental status, unspecified: Secondary | ICD-10-CM | POA: Diagnosis present

## 2020-12-04 DIAGNOSIS — Z7901 Long term (current) use of anticoagulants: Secondary | ICD-10-CM | POA: Insufficient documentation

## 2020-12-04 DIAGNOSIS — I5042 Chronic combined systolic (congestive) and diastolic (congestive) heart failure: Secondary | ICD-10-CM | POA: Diagnosis not present

## 2020-12-04 DIAGNOSIS — M25551 Pain in right hip: Secondary | ICD-10-CM | POA: Diagnosis not present

## 2020-12-04 DIAGNOSIS — I2510 Atherosclerotic heart disease of native coronary artery without angina pectoris: Secondary | ICD-10-CM | POA: Insufficient documentation

## 2020-12-04 DIAGNOSIS — S7000XA Contusion of unspecified hip, initial encounter: Secondary | ICD-10-CM

## 2020-12-04 DIAGNOSIS — I4891 Unspecified atrial fibrillation: Secondary | ICD-10-CM | POA: Diagnosis not present

## 2020-12-04 MED ORDER — HYDROCODONE-ACETAMINOPHEN 5-325 MG PO TABS
1.0000 | ORAL_TABLET | Freq: Once | ORAL | Status: AC
  Administered 2020-12-04: 1 via ORAL
  Filled 2020-12-04: qty 1

## 2020-12-04 NOTE — Discharge Instructions (Addendum)
It was our pleasure to provide your ER care today - we hope that you feel better.  Fall precautions.   Follow up with primary care doctor.  Return to ER if worse, new symptoms, new or severe pain, or other concern.

## 2020-12-04 NOTE — ED Provider Notes (Signed)
[facility] EMERGENCY DEPARTMENT Provider Note   CSN: [id number] Arrival date & time: 12/04/20  2143     History Chief Complaint  Patient presents with   Lytle Michaels    [name] [name] is a 87 y.o. female.  Patient with hx dementia, presents s/p fall. No loc noted, and pts mental status described as c/w baseline since fall. No vomiting. Pt limited historian - level 5 caveat. Per EMS ?report of hip pain.  Pt denies specific area of pain currently. Pt is on anticoag therapy, eliquis.   The history is provided by the patient, the EMS personnel and medical records. The history is limited by the condition of the patient.      Past Medical History:  Diagnosis Date   Atrial fibrillation ([place]) 10/06/2007   post op   COPD (chronic obstructive pulmonary disease) ([place])    Emphysema on 02/03/13 CXR   CORONARY ARTERY DISEASE 10/06/2007   a. s/p PCI to LAD; b. s/p CABG; c. LHC 07/13/12: Proximal LAD stent patent, LIMA-LAD atretic, D1 occluded, proximal circumflex 30%, mid RCA occluded, SVG-D1 normal, SVG-OM2 normal, SVG-distal RCA normal, EF 40% with diffuse HK   Dizziness    History of colonic polyps 10/30/2009   No polyps in 2011. No repeat.     HYPERLIPIDEMIA 10/06/2007   HYPERTENSION 10/06/2007   LBBB 09/06/2008   Left renal mass    MELANOMA 09/06/2008   MOES PROCEDURE RIGHT   MYOCARDIAL INFARCTION, HX OF 10/06/2007   NICM (nonischemic cardiomyopathy) ([place])    Echocardiogram 07/12/12: EF 25-30%, diffuse HK, mild AI, mild MR, mild LAE   PERSONAL HX COLONIC POLYPS 10/30/2009   Presence of permanent cardiac pacemaker    Syncope    UTERINE CANCER, HX OF 09/06/2008   VARICOSE VEIN 09/29/2009    Patient Active Problem List   Diagnosis Date Noted   Closed fracture of right femur, unspecified fracture morphology, initial encounter ([place]) 09/02/2020   Fracture of femoral neck, right ([place]) 09/01/2020   Closed hip fracture, right, initial encounter ([place]) 09/01/2020   Chronic obstructive  pulmonary disease ([place]) 03/20/2019   Major depressive disorder with single episode, in partial remission ([place]) 03/20/2019   Arthritis of carpometacarpal ([place]) joint of left thumb 06/02/2017   Current use of long term anticoagulation 06/02/2017   HCAP (healthcare-associated pneumonia) 04/04/2017   Traumatic perinephric hematoma of left kidney 03/29/2017   Acute blood loss as cause of postoperative anemia 03/29/2017   Left renal mass 01/27/2017   Overactive bladder 07/09/2016   Dizziness 05/07/2016   Microscopic hematuria 09/02/2015   CAD (coronary artery disease) 04/17/2014   Allergic rhinitis 04/17/2014   Dementia ([place]) 04/17/2014   Hearing loss 04/17/2014   Pacemaker -CRT-St. Jude 05/21/2013   Atrioventricular block, complete ([place]) 03/08/2013   Paroxysmal atrial fibrillation ([place]) 02/03/2013   Asthma, intrinsic 02/03/2013   Chronic combined systolic and diastolic congestive heart failure ([place]) 10/10/2012   Depression 09/19/2012   Varicose veins 09/29/2009   History of melanoma 09/06/2008   LBBB (left bundle branch block) 09/06/2008   History of uterine cancer 09/06/2008   Hyperlipidemia 10/06/2007   Essential hypertension 10/06/2007   Ischemic cardiomyopathy  EF 30% cath 4/14 with stent 10/06/2007    Past Surgical History:  Procedure Laterality Date   ABDOMINAL HYSTERECTOMY  1988   BI-VENTRICULAR PACEMAKER INSERTION (CRT-P)  02/02/2013   St. Jude, serial no. [id number]    CARDIAC CATHETERIZATION  08/30/2008   CORONARY ANGIOPLASTY WITH STENT PLACEMENT  2009   CORONARY ARTERY  BYPASS GRAFT  2004   IR RADIOLOGIST EVAL & MGMT  02/08/2017   IR RADIOLOGIST EVAL & MGMT  05/26/2017   IR RADIOLOGIST EVAL & MGMT  07/19/2017   IR RADIOLOGIST EVAL & MGMT  05/04/2018   LEFT HEART CATHETERIZATION WITH CORONARY ANGIOGRAM Bilateral 07/13/2012   Procedure: LEFT HEART CATHETERIZATION WITH CORONARY ANGIOGRAM;  Surgeon: Peter M Martinique, MD;  Location: [facility] CATH LAB;  Service: Cardiovascular;  Laterality:  Bilateral;   OOPHORECTOMY     PERMANENT PACEMAKER INSERTION N/A 02/02/2013   Procedure: PERMANENT PACEMAKER INSERTION;  Surgeon: Evans Lance, MD;  Location: [facility] CATH LAB;  Service: Cardiovascular;  Laterality: N/A;   RADIOLOGY WITH ANESTHESIA Left 03/28/2017   Procedure: RENAL CRYO ABLATION;  Surgeon: Aletta Edouard, MD;  Location: [facility] ORS;  Service: Radiology;  Laterality: Left;   right distal pretibeal     melanoma   TOTAL HIP ARTHROPLASTY Right 09/04/2020   Procedure: ANTERIOR TOTAL HIP ARTHROPLASTY;  Surgeon: Rod Can, MD;  Location: [facility] ORS;  Service: Orthopedics;  Laterality: Right;     OB History   No obstetric history on file.     Family History  Problem Relation Age of Onset   Stroke Mother    Heart attack Father    Colon cancer Neg Hx     Social History   Tobacco Use   Smoking status: Never   Smokeless tobacco: Never  Vaping Use   Vaping Use: Never used  Substance Use Topics   Alcohol use: Yes    Comment:  OCC gin or wine   Drug use: No    Home Medications Prior to Admission medications   Medication Sig Start Date End Date Taking? Authorizing Provider  acetaminophen (TYLENOL) 500 MG tablet Take 1,000 mg by mouth every 8 (eight) hours as needed for fever, moderate pain or mild pain.    [provider]  ELIQUIS 5 MG TABS tablet TAKE 1 TABLET(5 MG) BY MOUTH TWICE DAILY 09/25/19   Deboraha Sprang, MD  escitalopram (LEXAPRO) 20 MG tablet TAKE 1 TABLET(20 MG) BY MOUTH DAILY 11/26/19   Marin Olp, MD  haloperidol (HALDOL) 0.5 MG tablet Take 1 tablet (0.5 mg total) by mouth 2 (two) times daily as needed for agitation (separate by at least 6 hours). 07/02/19   Marin Olp, MD  lisinopril (ZESTRIL) 5 MG tablet Take 1 tablet (5 mg total) by mouth daily. 09/10/20   Shawna Clamp, MD  Melatonin 10 MG CAPS Take 10 mg by mouth at bedtime.    [provider]  memantine (NAMENDA) 5 MG tablet Take 5 mg by mouth 2 (two) times daily. 08/22/20    [provider]  pyridostigmine (MESTINON) 60 MG tablet TAKE 1 TABLET(60 MG) BY MOUTH DAILY 08/06/19   Marin Olp, MD    Allergies    Aricept [donepezil] and Nitroglycerin  Review of Systems   Review of Systems  Unable to perform ROS: Dementia  Level 5 caveat - dementia   Physical Exam Updated Vital Signs BP (!) 152/70 (BP Location: Left Arm)   Pulse 68   Temp 98.2 F (36.8 C) (Oral)   Resp 18   LMP  (LMP Unknown)   SpO2 98%   Physical Exam Vitals and nursing note reviewed.  Constitutional:      Appearance: Normal appearance. She is well-developed.  HENT:     Head: Atraumatic.     Nose: Nose normal.     Mouth/Throat:     Mouth: Mucous  membranes are moist.  Eyes:     General: No scleral icterus.    Conjunctiva/sclera: Conjunctivae normal.     Pupils: Pupils are equal, round, and reactive to light.  Neck:     Trachea: No tracheal deviation.  Cardiovascular:     Rate and Rhythm: Normal rate.     Pulses: Normal pulses.     Heart sounds: Normal heart sounds. No murmur heard.   No friction rub. No gallop.  Pulmonary:     Effort: Pulmonary effort is normal. No respiratory distress.     Breath sounds: Normal breath sounds.  Chest:     Chest wall: No tenderness.  Abdominal:     General: Bowel sounds are normal. There is no distension.     Palpations: Abdomen is soft.     Tenderness: There is no abdominal tenderness. There is no guarding.  Genitourinary:    Comments: No cva tenderness.  Musculoskeletal:        General: No swelling.     Cervical back: Normal range of motion and neck supple. No rigidity or tenderness. No muscular tenderness.     Comments: CTLS spine, non tender, aligned, no step off.   Skin:    General: Skin is warm and dry.     Findings: No rash.  Neurological:     Mental Status: She is alert.     Comments: Alert, speech normal. Moving bilateral extremities purposefully w good strength.   Psychiatric:        Mood and Affect: Mood  normal.    ED Results / Procedures / Treatments   Labs (all labs ordered are listed, but only abnormal results are displayed) Labs Reviewed - No data to display  EKG None  Radiology CT Head Wo Contrast  Result Date: 12/04/2020 CLINICAL DATA:  [place], head trauma EXAM: CT HEAD WITHOUT CONTRAST TECHNIQUE: Contiguous axial images were obtained from the base of the skull through the vertex without intravenous contrast. COMPARISON:  09/01/2020 FINDINGS: Brain: Hypodensities within the periventricular white matter are stable, consistent with chronic small vessel ischemic change. No signs of acute infarct or hemorrhage. Lateral ventricles and midline structures are otherwise unremarkable. No acute extra-axial fluid collections. No mass effect. Stable diffuse cerebral atrophy. Vascular: No hyperdense vessel or unexpected calcification. Skull: Normal. Negative for fracture or focal lesion. Sinuses/Orbits: No acute finding. Other: None. IMPRESSION: 1. No acute intracranial process. 2. Stable chronic small-vessel ischemic changes. Electronically Signed   By: Randa Ngo M.D.   On: 12/04/2020 22:45   DG HIP UNILAT WITH PELVIS 2-3 VIEWS LEFT  Result Date: 12/04/2020 CLINICAL DATA:  Post fall.  Back pain. EXAM: DG HIP (WITH OR WITHOUT PELVIS) 2-3V LEFT; DG HIP (WITH OR WITHOUT PELVIS) 2-3V RIGHT COMPARISON:  Pelvis and right hip radiograph 09/29/2020 FINDINGS: Right hip arthroplasty. No periprosthetic lucency or fracture. Left femoral head is well seated in the acetabulum. No evidence of left hip fracture. Pubic rami and remainder of the bony pelvis are intact. Degenerative change of the pubic symphysis and sacroiliac joints which remain congruent. The bones are under mineralized IMPRESSION: 1. No fracture of the pelvis or hips. 2. Intact right hip arthroplasty without periprosthetic fracture. Electronically Signed   By: Keith Rake M.D.   On: 12/04/2020 22:42   DG HIP UNILAT WITH PELVIS 2-3 VIEWS  RIGHT  Result Date: 12/04/2020 CLINICAL DATA:  Post fall.  Back pain. EXAM: DG HIP (WITH OR WITHOUT PELVIS) 2-3V LEFT; DG HIP (WITH OR WITHOUT PELVIS) 2-3V RIGHT COMPARISON:  Pelvis and right hip radiograph 09/29/2020 FINDINGS: Right hip arthroplasty. No periprosthetic lucency or fracture. Left femoral head is well seated in the acetabulum. No evidence of left hip fracture. Pubic rami and remainder of the bony pelvis are intact. Degenerative change of the pubic symphysis and sacroiliac joints which remain congruent. The bones are under mineralized IMPRESSION: 1. No fracture of the pelvis or hips. 2. Intact right hip arthroplasty without periprosthetic fracture. Electronically Signed   By: Keith Rake M.D.   On: 12/04/2020 22:42    Procedures Procedures   Medications Ordered in ED Medications - No data to display  ED Course  I have reviewed the triage vital signs and the nursing notes.  Pertinent labs & imaging results that were available during my care of the patient were reviewed by me and considered in my medical decision making (see chart for details).    MDM Rules/Calculators/A&P                          Imaging ordered.   Reviewed nursing notes and prior charts for additional history.   Xrays reviewed/interpreted by me - no fx.   CT reviewed/interpreted by me - no hem.   Recheck pt - no apparent pain or discomfort. Good rom bil extremities without pain or focal bony tenderness.   Pt currently appears stable for d/c.   Rec fall precautions, pcp f/u.      Final Clinical Impression(s) / ED Diagnoses Final diagnoses:  Fall    Rx / DC Orders ED Discharge Orders     None        Lajean Saver, MD 12/04/20 2253

## 2020-12-04 NOTE — ED Triage Notes (Signed)
Pt BIBA from [facility] assisted living. Per medic, patient found down on floor from unwitnessed fall. Patient endorses pain on right of hip. Patient arrived with hematoma to right hip. Patient is on thinners and denies LOC or hitting her head per medic report. Patient confused at baseline and constantly removing c-collar. VSS with BP 128/70, HR: 70 and Oxygen at 97% on RA.

## 2021-09-21 ENCOUNTER — Emergency Department (HOSPITAL_COMMUNITY): Payer: Medicare Other

## 2021-09-21 ENCOUNTER — Observation Stay (HOSPITAL_COMMUNITY): Payer: Medicare Other

## 2021-09-21 ENCOUNTER — Inpatient Hospital Stay (HOSPITAL_COMMUNITY): Payer: Medicare Other

## 2021-09-21 ENCOUNTER — Observation Stay (HOSPITAL_COMMUNITY)
Admission: EM | Admit: 2021-09-21 | Discharge: 2021-09-21 | Disposition: A | Discharge status: Home / Self Care | Payer: Medicare Other | Source: Home / Self Care | Attending: Emergency Medicine | Admitting: Emergency Medicine

## 2021-09-21 ENCOUNTER — Inpatient Hospital Stay (HOSPITAL_COMMUNITY)
Admission: EM | Admit: 2021-09-21 | Discharge: 2021-09-29 | DRG: 308 | Disposition: A | Discharge status: Skilled Nursing Facility | Payer: Medicare Other | Source: Skilled Nursing Facility | Attending: Family Medicine | Admitting: Family Medicine

## 2021-09-21 ENCOUNTER — Other Ambulatory Visit: Payer: Self-pay

## 2021-09-21 ENCOUNTER — Encounter (HOSPITAL_COMMUNITY): Payer: Self-pay | Admitting: Internal Medicine

## 2021-09-21 ENCOUNTER — Encounter (HOSPITAL_COMMUNITY): Payer: Self-pay

## 2021-09-21 DIAGNOSIS — J449 Chronic obstructive pulmonary disease, unspecified: Secondary | ICD-10-CM | POA: Insufficient documentation

## 2021-09-21 DIAGNOSIS — I5043 Acute on chronic combined systolic (congestive) and diastolic (congestive) heart failure: Secondary | ICD-10-CM | POA: Insufficient documentation

## 2021-09-21 DIAGNOSIS — F02C3 Dementia in other diseases classified elsewhere, severe, with mood disturbance: Secondary | ICD-10-CM | POA: Diagnosis present

## 2021-09-21 DIAGNOSIS — Z96641 Presence of right artificial hip joint: Secondary | ICD-10-CM | POA: Insufficient documentation

## 2021-09-21 DIAGNOSIS — Z8541 Personal history of malignant neoplasm of cervix uteri: Secondary | ICD-10-CM | POA: Insufficient documentation

## 2021-09-21 DIAGNOSIS — W1830XA Fall on same level, unspecified, initial encounter: Secondary | ICD-10-CM | POA: Diagnosis present

## 2021-09-21 DIAGNOSIS — Z8249 Family history of ischemic heart disease and other diseases of the circulatory system: Secondary | ICD-10-CM

## 2021-09-21 DIAGNOSIS — I110 Hypertensive heart disease with heart failure: Secondary | ICD-10-CM | POA: Insufficient documentation

## 2021-09-21 DIAGNOSIS — Z7901 Long term (current) use of anticoagulants: Secondary | ICD-10-CM | POA: Insufficient documentation

## 2021-09-21 DIAGNOSIS — Z20822 Contact with and (suspected) exposure to covid-19: Secondary | ICD-10-CM | POA: Insufficient documentation

## 2021-09-21 DIAGNOSIS — W19XXXA Unspecified fall, initial encounter: Secondary | ICD-10-CM

## 2021-09-21 DIAGNOSIS — Z79899 Other long term (current) drug therapy: Secondary | ICD-10-CM | POA: Insufficient documentation

## 2021-09-21 DIAGNOSIS — R0602 Shortness of breath: Secondary | ICD-10-CM

## 2021-09-21 DIAGNOSIS — I4729 Other ventricular tachycardia: Secondary | ICD-10-CM | POA: Diagnosis not present

## 2021-09-21 DIAGNOSIS — Z9181 History of falling: Secondary | ICD-10-CM | POA: Diagnosis not present

## 2021-09-21 DIAGNOSIS — E785 Hyperlipidemia, unspecified: Secondary | ICD-10-CM | POA: Diagnosis present

## 2021-09-21 DIAGNOSIS — Y92129 Unspecified place in nursing home as the place of occurrence of the external cause: Secondary | ICD-10-CM | POA: Diagnosis not present

## 2021-09-21 DIAGNOSIS — I4721 Torsades de pointes: Principal | ICD-10-CM | POA: Diagnosis present

## 2021-09-21 DIAGNOSIS — Z8542 Personal history of malignant neoplasm of other parts of uterus: Secondary | ICD-10-CM

## 2021-09-21 DIAGNOSIS — E876 Hypokalemia: Secondary | ICD-10-CM

## 2021-09-21 DIAGNOSIS — Z955 Presence of coronary angioplasty implant and graft: Secondary | ICD-10-CM | POA: Insufficient documentation

## 2021-09-21 DIAGNOSIS — F0390 Unspecified dementia without behavioral disturbance: Secondary | ICD-10-CM | POA: Insufficient documentation

## 2021-09-21 DIAGNOSIS — I2510 Atherosclerotic heart disease of native coronary artery without angina pectoris: Secondary | ICD-10-CM | POA: Insufficient documentation

## 2021-09-21 DIAGNOSIS — S41112A Laceration without foreign body of left upper arm, initial encounter: Secondary | ICD-10-CM | POA: Diagnosis present

## 2021-09-21 DIAGNOSIS — I5020 Unspecified systolic (congestive) heart failure: Secondary | ICD-10-CM | POA: Diagnosis present

## 2021-09-21 DIAGNOSIS — Z66 Do not resuscitate: Secondary | ICD-10-CM | POA: Insufficient documentation

## 2021-09-21 DIAGNOSIS — I472 Ventricular tachycardia, unspecified: Secondary | ICD-10-CM | POA: Diagnosis present

## 2021-09-21 DIAGNOSIS — F02C11 Dementia in other diseases classified elsewhere, severe, with agitation: Secondary | ICD-10-CM | POA: Diagnosis present

## 2021-09-21 DIAGNOSIS — B952 Enterococcus as the cause of diseases classified elsewhere: Secondary | ICD-10-CM | POA: Diagnosis present

## 2021-09-21 DIAGNOSIS — Z85828 Personal history of other malignant neoplasm of skin: Secondary | ICD-10-CM | POA: Insufficient documentation

## 2021-09-21 DIAGNOSIS — Z950 Presence of cardiac pacemaker: Secondary | ICD-10-CM

## 2021-09-21 DIAGNOSIS — I255 Ischemic cardiomyopathy: Secondary | ICD-10-CM | POA: Diagnosis present

## 2021-09-21 DIAGNOSIS — B961 Klebsiella pneumoniae [K. pneumoniae] as the cause of diseases classified elsewhere: Secondary | ICD-10-CM | POA: Diagnosis present

## 2021-09-21 DIAGNOSIS — Z23 Encounter for immunization: Secondary | ICD-10-CM

## 2021-09-21 DIAGNOSIS — I10 Essential (primary) hypertension: Secondary | ICD-10-CM | POA: Diagnosis present

## 2021-09-21 DIAGNOSIS — I459 Conduction disorder, unspecified: Secondary | ICD-10-CM | POA: Diagnosis present

## 2021-09-21 DIAGNOSIS — Z951 Presence of aortocoronary bypass graft: Secondary | ICD-10-CM | POA: Diagnosis not present

## 2021-09-21 DIAGNOSIS — I428 Other cardiomyopathies: Secondary | ICD-10-CM | POA: Diagnosis present

## 2021-09-21 DIAGNOSIS — I5042 Chronic combined systolic (congestive) and diastolic (congestive) heart failure: Secondary | ICD-10-CM | POA: Diagnosis present

## 2021-09-21 DIAGNOSIS — G253 Myoclonus: Secondary | ICD-10-CM | POA: Diagnosis present

## 2021-09-21 DIAGNOSIS — I480 Paroxysmal atrial fibrillation: Secondary | ICD-10-CM | POA: Insufficient documentation

## 2021-09-21 DIAGNOSIS — F03918 Unspecified dementia, unspecified severity, with other behavioral disturbance: Secondary | ICD-10-CM | POA: Diagnosis present

## 2021-09-21 DIAGNOSIS — Z90710 Acquired absence of both cervix and uterus: Secondary | ICD-10-CM

## 2021-09-21 DIAGNOSIS — F02C18 Dementia in other diseases classified elsewhere, severe, with other behavioral disturbance: Secondary | ICD-10-CM | POA: Diagnosis present

## 2021-09-21 DIAGNOSIS — N179 Acute kidney failure, unspecified: Secondary | ICD-10-CM

## 2021-09-21 DIAGNOSIS — R569 Unspecified convulsions: Secondary | ICD-10-CM | POA: Diagnosis not present

## 2021-09-21 DIAGNOSIS — I951 Orthostatic hypotension: Secondary | ICD-10-CM | POA: Diagnosis not present

## 2021-09-21 DIAGNOSIS — M25512 Pain in left shoulder: Secondary | ICD-10-CM | POA: Insufficient documentation

## 2021-09-21 DIAGNOSIS — Y9289 Other specified places as the place of occurrence of the external cause: Secondary | ICD-10-CM | POA: Insufficient documentation

## 2021-09-21 DIAGNOSIS — R112 Nausea with vomiting, unspecified: Secondary | ICD-10-CM

## 2021-09-21 DIAGNOSIS — R55 Syncope and collapse: Secondary | ICD-10-CM

## 2021-09-21 DIAGNOSIS — J439 Emphysema, unspecified: Secondary | ICD-10-CM | POA: Diagnosis present

## 2021-09-21 DIAGNOSIS — R296 Repeated falls: Secondary | ICD-10-CM | POA: Diagnosis present

## 2021-09-21 DIAGNOSIS — Z85820 Personal history of malignant melanoma of skin: Secondary | ICD-10-CM

## 2021-09-21 DIAGNOSIS — F32A Depression, unspecified: Secondary | ICD-10-CM | POA: Diagnosis present

## 2021-09-21 DIAGNOSIS — I252 Old myocardial infarction: Secondary | ICD-10-CM

## 2021-09-21 DIAGNOSIS — Z823 Family history of stroke: Secondary | ICD-10-CM

## 2021-09-21 DIAGNOSIS — R8271 Bacteriuria: Secondary | ICD-10-CM

## 2021-09-21 HISTORY — DX: Unspecified fall, initial encounter: W19.XXXA

## 2021-09-21 LAB — CBC WITH DIFFERENTIAL/PLATELET
Abs Immature Granulocytes: 0.02 10*3/uL (ref 0.00–0.07)
Abs Immature Granulocytes: 0.04 10*3/uL (ref 0.00–0.07)
Basophils Absolute: 0 10*3/uL (ref 0.0–0.1)
Basophils Absolute: 0.1 10*3/uL (ref 0.0–0.1)
Basophils Relative: 0 %
Basophils Relative: 1 %
Eosinophils Absolute: 0.1 10*3/uL (ref 0.0–0.5)
Eosinophils Absolute: 0.1 10*3/uL (ref 0.0–0.5)
Eosinophils Relative: 2 %
Eosinophils Relative: 1 %
HCT: 35.8 % — ABNORMAL LOW (ref 36.0–46.0)
HCT: 40.2 % (ref 36.0–46.0)
Hemoglobin: 11.3 g/dL — ABNORMAL LOW (ref 12.0–15.0)
Hemoglobin: 12.2 g/dL (ref 12.0–15.0)
Immature Granulocytes: 0 %
Immature Granulocytes: 0 %
Lymphocytes Relative: 11 %
Lymphocytes Relative: 15 %
Lymphs Abs: 0.8 10*3/uL (ref 0.7–4.0)
Lymphs Abs: 1.6 10*3/uL (ref 0.7–4.0)
MCH: 30.9 pg (ref 26.0–34.0)
MCH: 30 pg (ref 26.0–34.0)
MCHC: 31.6 g/dL (ref 30.0–36.0)
MCHC: 30.3 g/dL (ref 30.0–36.0)
MCV: 97.8 fL (ref 80.0–100.0)
MCV: 99 fL (ref 80.0–100.0)
Monocytes Absolute: 0.4 10*3/uL (ref 0.1–1.0)
Monocytes Absolute: 0.8 10*3/uL (ref 0.1–1.0)
Monocytes Relative: 6 %
Monocytes Relative: 7 %
Neutro Abs: 6 10*3/uL (ref 1.7–7.7)
Neutro Abs: 8.1 10*3/uL — ABNORMAL HIGH (ref 1.7–7.7)
Neutrophils Relative %: 81 %
Neutrophils Relative %: 76 %
Platelets: 119 10*3/uL — ABNORMAL LOW (ref 150–400)
Platelets: 128 10*3/uL — ABNORMAL LOW (ref 150–400)
RBC: 3.66 MIL/uL — ABNORMAL LOW (ref 3.87–5.11)
RBC: 4.06 MIL/uL (ref 3.87–5.11)
RDW: 15.1 % (ref 11.5–15.5)
RDW: 15.1 % (ref 11.5–15.5)
WBC: 7.4 10*3/uL (ref 4.0–10.5)
WBC: 10.7 10*3/uL — ABNORMAL HIGH (ref 4.0–10.5)
nRBC: 0 % (ref 0.0–0.2)
nRBC: 0 % (ref 0.0–0.2)

## 2021-09-21 LAB — BASIC METABOLIC PANEL
Anion gap: 10 (ref 5–15)
Anion gap: 13 (ref 5–15)
BUN: 14 mg/dL (ref 8–23)
BUN: 13 mg/dL (ref 8–23)
CO2: 27 mmol/L (ref 22–32)
CO2: 23 mmol/L (ref 22–32)
Calcium: 8.8 mg/dL — ABNORMAL LOW (ref 8.9–10.3)
Calcium: 8.7 mg/dL — ABNORMAL LOW (ref 8.9–10.3)
Chloride: 107 mmol/L (ref 98–111)
Chloride: 101 mmol/L (ref 98–111)
Creatinine, Ser: 0.91 mg/dL (ref 0.44–1.00)
Creatinine, Ser: 0.97 mg/dL (ref 0.44–1.00)
GFR, Estimated: >60 mL/min (ref >60)
GFR, Estimated: 57 mL/min — ABNORMAL LOW (ref >60)
Glucose, Bld: 113 mg/dL — ABNORMAL HIGH (ref 70–99)
Glucose, Bld: 182 mg/dL — ABNORMAL HIGH (ref 70–99)
Potassium: 3.5 mmol/L (ref 3.5–5.1)
Potassium: 3.1 mmol/L — ABNORMAL LOW (ref 3.5–5.1)
Sodium: 144 mmol/L (ref 135–145)
Sodium: 137 mmol/L (ref 135–145)

## 2021-09-21 LAB — URINALYSIS, ROUTINE W REFLEX MICROSCOPIC
Bilirubin Urine: NEGATIVE
Glucose, UA: NEGATIVE mg/dL
Hgb urine dipstick: NEGATIVE
Ketones, ur: 20 mg/dL — AB
Leukocytes,Ua: NEGATIVE
Nitrite: NEGATIVE
Protein, ur: 100 mg/dL — AB
RBC / HPF: >50 RBC/hpf — ABNORMAL HIGH (ref 0–5)
Specific Gravity, Urine: 1.032 — ABNORMAL HIGH (ref 1.005–1.030)
pH: 5 (ref 5.0–8.0)

## 2021-09-21 LAB — I-STAT CHEM 8, ED
BUN: 15 mg/dL (ref 8–23)
Calcium, Ion: 0.98 mmol/L — ABNORMAL LOW (ref 1.15–1.40)
Chloride: 104 mmol/L (ref 98–111)
Creatinine, Ser: 0.9 mg/dL (ref 0.44–1.00)
Glucose, Bld: 177 mg/dL — ABNORMAL HIGH (ref 70–99)
HCT: 40 % (ref 36.0–46.0)
Hemoglobin: 13.6 g/dL (ref 12.0–15.0)
Potassium: 3.1 mmol/L — ABNORMAL LOW (ref 3.5–5.1)
Sodium: 141 mmol/L (ref 135–145)
TCO2: 24 mmol/L (ref 22–32)

## 2021-09-21 LAB — TROPONIN I (HIGH SENSITIVITY)
Troponin I (High Sensitivity): 31 ng/L — ABNORMAL HIGH (ref <18)
Troponin I (High Sensitivity): 25 ng/L — ABNORMAL HIGH (ref <18)
Troponin I (High Sensitivity): 33 ng/L — ABNORMAL HIGH (ref <18)

## 2021-09-21 LAB — MAGNESIUM: Magnesium: 1.8 mg/dL (ref 1.7–2.4)

## 2021-09-21 LAB — SARS CORONAVIRUS 2 BY RT PCR: SARS Coronavirus 2 by RT PCR: NEGATIVE

## 2021-09-21 LAB — BRAIN NATRIURETIC PEPTIDE: B Natriuretic Peptide: 862.4 pg/mL — ABNORMAL HIGH (ref 0.0–100.0)

## 2021-09-21 MED ORDER — ACETAMINOPHEN 325 MG PO TABS
650.0000 mg | ORAL_TABLET | Freq: Four times a day (QID) | ORAL | Status: DC | PRN
  Administered 2021-09-22 – 2021-09-26 (×5): 650 mg via ORAL
  Filled 2021-09-21 (×5): qty 2

## 2021-09-21 MED ORDER — TETANUS-DIPHTH-ACELL PERTUSSIS 5-2.5-18.5 LF-MCG/0.5 IM SUSY
0.5000 mL | PREFILLED_SYRINGE | Freq: Once | INTRAMUSCULAR | Status: AC
  Administered 2021-09-21: 0.5 mL via INTRAMUSCULAR
  Filled 2021-09-21: qty 0.5

## 2021-09-21 MED ORDER — LORAZEPAM 2 MG/ML IJ SOLN
0.5000 mg | Freq: Once | INTRAMUSCULAR | Status: AC
  Administered 2021-09-21: 0.5 mg via INTRAVENOUS
  Filled 2021-09-21: qty 1

## 2021-09-21 MED ORDER — AMIODARONE HCL IN DEXTROSE 360-4.14 MG/200ML-% IV SOLN
30.0000 mg/h | INTRAVENOUS | Status: DC
  Administered 2021-09-22: 30 mg/h via INTRAVENOUS
  Filled 2021-09-21: qty 200

## 2021-09-21 MED ORDER — ONDANSETRON HCL 4 MG/2ML IJ SOLN
4.0000 mg | Freq: Once | INTRAMUSCULAR | Status: AC
  Administered 2021-09-21: 4 mg via INTRAVENOUS
  Filled 2021-09-21: qty 2

## 2021-09-21 MED ORDER — ACETAMINOPHEN 650 MG RE SUPP: 650.0000 mg | Freq: Four times a day (QID) | RECTAL | Status: DC | PRN

## 2021-09-21 MED ORDER — AMIODARONE IV BOLUS ONLY 150 MG/100ML
INTRAVENOUS | Status: AC
  Filled 2021-09-21: qty 100

## 2021-09-21 MED ORDER — AMIODARONE HCL IN DEXTROSE 360-4.14 MG/200ML-% IV SOLN
60.0000 mg/h | INTRAVENOUS | Status: DC
  Administered 2021-09-21: 60 mg/h via INTRAVENOUS
  Filled 2021-09-21: qty 200

## 2021-09-21 MED ORDER — POTASSIUM CHLORIDE 10 MEQ/100ML IV SOLN
10.0000 meq | INTRAVENOUS | Status: AC
  Administered 2021-09-21 – 2021-09-22 (×4): 10 meq via INTRAVENOUS
  Filled 2021-09-21 (×4): qty 100

## 2021-09-21 MED ORDER — FUROSEMIDE 10 MG/ML IJ SOLN
20.0000 mg | Freq: Once | INTRAMUSCULAR | Status: AC
  Administered 2021-09-21: 20 mg via INTRAVENOUS
  Filled 2021-09-21: qty 2

## 2021-09-21 MED ORDER — LIDOCAINE-EPINEPHRINE (PF) 2 %-1:200000 IJ SOLN
INTRAMUSCULAR | Status: AC
  Filled 2021-09-21: qty 20

## 2021-09-21 MED ORDER — AMIODARONE LOAD VIA INFUSION
150.0000 mg | Freq: Once | INTRAVENOUS | Status: DC
  Filled 2021-09-21 (×5): qty 83.34

## 2021-09-21 MED ORDER — ENOXAPARIN SODIUM 40 MG/0.4ML IJ SOSY
40.0000 mg | PREFILLED_SYRINGE | INTRAMUSCULAR | Status: DC
  Administered 2021-09-21 – 2021-09-22 (×2): 40 mg via SUBCUTANEOUS
  Filled 2021-09-21 (×2): qty 0.4

## 2021-09-21 NOTE — ED Notes (Addendum)
This RN called Rulon Sera x1 for report.

## 2021-09-21 NOTE — ED Triage Notes (Signed)
Pt arrived d/t possible seizure witnessed by family. Family described it as her eyes deviated to upper left and her arms were hyperextended for appx 45 seconds. Pt was just discharged from [facility] ED today.

## 2021-09-21 NOTE — Progress Notes (Signed)
EEG complete - results pending 

## 2021-09-21 NOTE — ED Notes (Signed)
Pt had an inc episode, Agricultural consultant cleaned her up. Pt then asked for something to eat, writer provided pt with a sandwich and a water

## 2021-09-21 NOTE — ED Notes (Signed)
This RN and a NT provided peri care and changed pt's brief. Purewick placed on pt and pt now laying comfortably.

## 2021-09-21 NOTE — ED Triage Notes (Signed)
Patient BIB [facility] from [place] after unwitness fall. Patient is on eliquis. EMS reports no obvious trauma to head, patient is reporting only left shoulder pain.

## 2021-09-21 NOTE — ED Notes (Signed)
This RN and NT changed patient's bed linen and gown. A clean gown and bedsheets were provided to patient. Patient was cleaned with soap and water. Patient was also provided with a sandwich and drink.

## 2021-09-21 NOTE — ED Notes (Signed)
Suture cart and lidocaine at bedside.  

## 2021-09-21 NOTE — ED Notes (Signed)
EEG at bedside.

## 2021-09-21 NOTE — ED Notes (Signed)
Patient transported to X-ray 

## 2021-09-21 NOTE — ED Provider Notes (Signed)
[facility] EMERGENCY DEPARTMENT Provider Note   CSN: [id number] Arrival date & time: 09/21/21  1205     History  Chief Complaint  Patient presents with   Lytle Michaels    [name] [name] is a 88 y.o. female.  HPI  88 year old female presents emergency department from facility for unwitnessed fall versus syncope.  Reports that the patient was seen awake this morning and then found down.  Patient has history of dementia, poor historian, no family or staff at bedside.  Arrives in a c-collar.  Is noted to be anticoagulated on Eliquis, possible head injury.  Report from daughter on the phone is that she has been dealing with worsening shortness of breath over the past couple days, possible history of CHF.  No other reported fever or illness.  Patient has inconsistent complaints but does speak of left shoulder pain.  Home Medications Prior to Admission medications   Medication Sig Start Date End Date Taking? Authorizing Provider  acetaminophen (TYLENOL) 500 MG tablet Take 1,000 mg by mouth every 8 (eight) hours as needed for fever, moderate pain or mild pain.   Yes [provider]  Cholecalciferol (VITAMIN D3 SUPER STRENGTH) 50 MCG (2000 UT) TABS Take 1 tablet by mouth daily.   Yes [provider]  ELIQUIS 5 MG TABS tablet TAKE 1 TABLET(5 MG) BY MOUTH TWICE DAILY Patient taking differently: Take 5 mg by mouth 2 (two) times daily. 09/25/19  Yes Deboraha Sprang, MD  escitalopram (LEXAPRO) 20 MG tablet TAKE 1 TABLET(20 MG) BY MOUTH DAILY 11/26/19  Yes Marin Olp, MD  lisinopril (ZESTRIL) 5 MG tablet Take 1 tablet (5 mg total) by mouth daily. 09/10/20  Yes Shawna Clamp, MD  Melatonin 10 MG CAPS Take 10 mg by mouth at bedtime.   Yes [provider]  memantine (NAMENDA) 5 MG tablet Take 5 mg by mouth 2 (two) times daily. 08/22/20  Yes [provider]  pyridostigmine (MESTINON) 60 MG tablet TAKE 1 TABLET(60 MG) BY MOUTH DAILY 08/06/19  Yes Marin Olp, MD  QUEtiapine (SEROQUEL) 25 MG tablet Take 25 mg by mouth at bedtime.   Yes [provider]  traMADol (ULTRAM) 50 MG tablet Take 25 mg by mouth 2 (two) times daily.   Yes [provider]  haloperidol (HALDOL) 0.5 MG tablet Take 1 tablet (0.5 mg total) by mouth 2 (two) times daily as needed for agitation (separate by at least 6 hours). Patient not taking: Reported on 09/21/2021 07/02/19   Marin Olp, MD      Allergies    Aricept [donepezil] and Nitroglycerin    Review of Systems   Review of Systems  Unable to perform ROS: Mental status change    Physical Exam Updated Vital Signs BP (!) 176/71   Pulse 70   Temp 98.1 F (36.7 C) (Oral)   Resp (!) 22   LMP  (LMP Unknown)   SpO2 97%  Physical Exam Vitals and nursing note reviewed.  Constitutional:      General: She is not in acute distress.    Appearance: Normal appearance.  HENT:     Head: Normocephalic.     Right Ear: External ear normal.     Left Ear: External ear normal.     Mouth/Throat:     Mouth: Mucous membranes are moist.  Eyes:     Extraocular Movements: Extraocular movements intact.     Pupils: Pupils are equal, round, and reactive to light.  Cardiovascular:  Rate and Rhythm: Normal rate.  Pulmonary:     Effort: Pulmonary effort is normal. No respiratory distress.  Abdominal:     Palpations: Abdomen is soft.     Tenderness: There is no abdominal tenderness.  Musculoskeletal:        General: No swelling or deformity.     Cervical back: No rigidity or tenderness.  Skin:    General: Skin is warm.     Comments: Left upper arm skin tear/laceration  Neurological:     Mental Status: She is alert and oriented to person, place, and time. Mental status is at baseline.  Psychiatric:        Mood and Affect: Mood normal.     ED Results / Procedures / Treatments   Labs (all labs ordered are listed, but only abnormal results are displayed) Labs Reviewed  SARS CORONAVIRUS 2 BY  RT PCR  CBC WITH DIFFERENTIAL/PLATELET  BASIC METABOLIC PANEL  URINALYSIS, ROUTINE W REFLEX MICROSCOPIC  BRAIN NATRIURETIC PEPTIDE  TROPONIN I (HIGH SENSITIVITY)    EKG EKG Interpretation  Date/Time:  Monday September 21 2021 12:25:03 EDT Ventricular Rate:  70 PR Interval:  226 QRS Duration: 146 QT Interval:  494 QTC Calculation: 534 R Axis:   -76 Text Interpretation: Sinus rhythm Prolonged PR interval IVCD, consider atypical RBBB LVH with secondary repolarization abnormality Inferior infarct, acute Anterior Q waves, possibly due to LVH Lateral leads are also involved >>> Acute MI <<< Unchanged from previous Confirmed by Lavenia Atlas [phone]) on 09/21/2021 12:32:17 PM  Radiology CT Head Wo Contrast  Result Date: 09/21/2021 CLINICAL DATA:  Headache, trauma EXAM: CT HEAD WITHOUT CONTRAST CT CERVICAL SPINE WITHOUT CONTRAST TECHNIQUE: Multidetector CT imaging of the head and cervical spine was performed following the standard protocol without intravenous contrast. Multiplanar CT image reconstructions of the cervical spine were also generated. RADIATION DOSE REDUCTION: This exam was performed according to the departmental dose-optimization program which includes automated exposure control, adjustment of the mA and/or kV according to patient size and/or use of iterative reconstruction technique. COMPARISON:  CT head and cervical spine 09/01/2020 FINDINGS: CT HEAD FINDINGS Brain: There is no evidence of acute intracranial hemorrhage, extra-axial fluid collection, or acute infarct. There is mild-to-moderate global parenchymal volume loss with prominence of the ventricular system and extra-axial CSF spaces, not significantly changed since the prior study from 2022. Patchy and confluent hypodensity throughout the subcortical and periventricular white matter likely reflects sequela of advanced chronic white matter microangiopathy. A probable small calcified meningioma overlying the right frontal lobe is  unchanged. There is no other solid mass lesion. There is no mass effect or midline shift. Vascular: There is calcification of the bilateral cavernous ICAs. Skull: Normal. Negative for fracture or focal lesion. Sinuses/Orbits: The imaged paranasal sinuses are clear. Bilateral lens implants are in place. The globes and orbits are otherwise unremarkable. Other: None. CT CERVICAL SPINE FINDINGS Alignment: There is slight reversal of the normal cervical lordosis centered at C5, unchanged. There is no antero or retrolisthesis. There is no jumped or perched facets or other evidence of traumatic malalignment. Skull base and vertebrae: Skull base alignment is maintained. Vertebral body heights are preserved. There is no evidence of acute fracture. Ankylosis of the facet joints at C2 through C4 is unchanged. Soft tissues and spinal canal: No prevertebral fluid or swelling. No visible canal hematoma. Disc levels: Multilevel disc space narrowing and degenerative endplate changes again seen, most advanced at C4-C5 and C5-C6. Multilevel facet arthropathy is also similar to the prior  study. There is no evidence of high-grade spinal canal stenosis. Upper chest: The imaged lung apices are clear. Other: None. IMPRESSION: 1. No acute intracranial pathology. 2. No acute fracture or traumatic malalignment of the cervical spine. Electronically Signed   By: [name] M.D.   On: 09/21/2021 14:14   CT Cervical Spine Wo Contrast  Result Date: 09/21/2021 CLINICAL DATA:  Headache, trauma EXAM: CT HEAD WITHOUT CONTRAST CT CERVICAL SPINE WITHOUT CONTRAST TECHNIQUE: Multidetector CT imaging of the head and cervical spine was performed following the standard protocol without intravenous contrast. Multiplanar CT image reconstructions of the cervical spine were also generated. RADIATION DOSE REDUCTION: This exam was performed according to the departmental dose-optimization program which includes automated exposure control, adjustment of the  mA and/or kV according to patient size and/or use of iterative reconstruction technique. COMPARISON:  CT head and cervical spine 09/01/2020 FINDINGS: CT HEAD FINDINGS Brain: There is no evidence of acute intracranial hemorrhage, extra-axial fluid collection, or acute infarct. There is mild-to-moderate global parenchymal volume loss with prominence of the ventricular system and extra-axial CSF spaces, not significantly changed since the prior study from 2022. Patchy and confluent hypodensity throughout the subcortical and periventricular white matter likely reflects sequela of advanced chronic white matter microangiopathy. A probable small calcified meningioma overlying the right frontal lobe is unchanged. There is no other solid mass lesion. There is no mass effect or midline shift. Vascular: There is calcification of the bilateral cavernous ICAs. Skull: Normal. Negative for fracture or focal lesion. Sinuses/Orbits: The imaged paranasal sinuses are clear. Bilateral lens implants are in place. The globes and orbits are otherwise unremarkable. Other: None. CT CERVICAL SPINE FINDINGS Alignment: There is slight reversal of the normal cervical lordosis centered at C5, unchanged. There is no antero or retrolisthesis. There is no jumped or perched facets or other evidence of traumatic malalignment. Skull base and vertebrae: Skull base alignment is maintained. Vertebral body heights are preserved. There is no evidence of acute fracture. Ankylosis of the facet joints at C2 through C4 is unchanged. Soft tissues and spinal canal: No prevertebral fluid or swelling. No visible canal hematoma. Disc levels: Multilevel disc space narrowing and degenerative endplate changes again seen, most advanced at C4-C5 and C5-C6. Multilevel facet arthropathy is also similar to the prior study. There is no evidence of high-grade spinal canal stenosis. Upper chest: The imaged lung apices are clear. Other: None. IMPRESSION: 1. No acute  intracranial pathology. 2. No acute fracture or traumatic malalignment of the cervical spine. Electronically Signed   By: [name] M.D.   On: 09/21/2021 14:14   DG Shoulder Left  Result Date: 09/21/2021 CLINICAL DATA:  Status post fall, shoulder pain EXAM: LEFT SHOULDER - 2+ VIEW COMPARISON:  None Available. FINDINGS: There is no evidence of fracture or dislocation. There is no evidence of arthropathy or other focal bone abnormality. Soft tissues are unremarkable. IMPRESSION: Negative. Electronically Signed   By: Kathreen Devoid M.D.   On: 09/21/2021 13:51   DG Chest 1 View  Result Date: 09/21/2021 CLINICAL DATA:  Status post fall.  History of AFib. EXAM: CHEST  1 VIEW COMPARISON:  09/01/2020 FINDINGS: Bilateral mild interstitial thickening. No focal consolidation. No pleural effusion or pneumothorax. Enlarged central pulmonary vasculature. Stable cardiomegaly. Prior CABG. Dual lead cardiac pacemaker. No acute osseous abnormality. IMPRESSION: 1. Cardiomegaly with mild pulmonary vascular congestion. Electronically Signed   By: Kathreen Devoid M.D.   On: 09/21/2021 13:50   DG Pelvis 1-2 Views  Result Date: 09/21/2021 CLINICAL  DATA:  Status post fall. EXAM: PELVIS - 1-2 VIEW COMPARISON:  None Available. FINDINGS: Generalized osteopenia. No acute fracture or dislocation. Right hip arthroplasty. No aggressive osseous lesion. Normal alignment. Soft tissue are unremarkable. No radiopaque foreign body or soft tissue emphysema. IMPRESSION: 1. No acute osseous injury of the pelvis. Electronically Signed   By: Kathreen Devoid M.D.   On: 09/21/2021 13:49    Procedures .Marland KitchenLaceration Repair  Date/Time: 09/21/2021 3:40 PM  Performed by: Lorelle Gibbs, DO Authorized by: Lorelle Gibbs, DO   Consent:    Consent obtained:  Verbal   Consent given by:  Patient and healthcare agent   Risks discussed:  Infection, poor cosmetic result, poor wound healing and pain Universal protocol:    Imaging studies available:  yes     Patient identity confirmed:  Verbally with patient and arm band Anesthesia:    Anesthesia method:  Local infiltration   Local anesthetic:  Lidocaine 2% WITH epi Laceration details:    Location:  Shoulder/arm   Shoulder/arm location:  L upper arm   Length (cm):  8 Pre-procedure details:    Preparation:  Patient was prepped and draped in usual sterile fashion Exploration:    Hemostasis achieved with:  Direct pressure   Imaging obtained: x-ray   Treatment:    Area cleansed with:  Saline   Irrigation solution:  Sterile water and sterile saline   Irrigation method:  Syringe Skin repair:    Repair method:  Sutures   Suture size:  4-0   Suture material:  Prolene   Suture technique:  Simple interrupted   Number of sutures:  3 Approximation:    Approximation:  Close Repair type:    Repair type:  Simple Post-procedure details:    Dressing:  Non-adherent dressing   Procedure completion:  Tolerated     Medications Ordered in ED Medications  lidocaine-EPINEPHrine (XYLOCAINE W/EPI) 2 %-1:200000 (PF) injection (has no administration in time range)  Tdap (BOOSTRIX) injection 0.5 mL (has no administration in time range)    ED Course/ Medical Decision Making/ A&P                           Medical Decision Making Amount and/or Complexity of Data Reviewed Labs: ordered. Radiology: ordered.  Risk Prescription drug management.   88 year old female presents emergency department for unwitnessed fall versus syncope.  Daughter reveals the patient has been having worsening shortness of breath over the past couple days.  She does have rales on lung auscultation, tachypneic at times but otherwise stable vitals.  Patient has severe dementia, poor historian and level 5 caveat secondary to that.  Patient has a large skin tear to the left upper arm with an area that transitions to full laceration.  This was repaired with 3 sutures, will be completed with Steri-Strips and dressing.   Tetanus updated.  CT of the head and neck is unremarkable, c-collar cleared.  Left shoulder, pelvis showed no acute fractures or findings.  Chest x-ray shows cardiomegaly with worsening congestion.  Patient has documented low EF of 35%, concern for CHF.  Patient is pending lab evaluation/cardiac evaluation.  May require diuresis and admission.  Daughter [name] has been updated.  Patient signed out pending lab work.        Final Clinical Impression(s) / ED Diagnoses Final diagnoses:  None    Rx / DC Orders ED Discharge Orders     None  Lorelle Gibbs, DO 09/21/21 1542

## 2021-09-21 NOTE — ED Notes (Signed)
Pt placed on 4L [place] d/t O2 sats in the mid 80s on RA. Pt now 96% on 4L [place]

## 2021-09-21 NOTE — Discharge Instructions (Signed)
Return for any problem.  ?

## 2021-09-21 NOTE — Consult Note (Signed)
ER Consult   Patient: [name] [name] IRJ:[id number] DOB: 26-Jul-1931 DOA: 09/21/2021 DOS: the patient was seen and examined on 09/21/2021 PCP: Marin Olp, MD  Patient coming from: ALF/ILF - [facility]; NOK: Daughter, [name], [phone]   Chief Complaint: Fall  HPI: [name] is a 88 y.o. female with medical history significant of afib on Eliquis; dementia; COPD; CAD s/p stent, CABG; HTN; HLD; chronic systolic CHF; and pacemaker placement presenting with a fall.   The patient is oriented x 1 and unable to provide history.  She is agitated and beginning to pull on cords and lines.  Her daughter doesn't know why she fell but she was tachypneic on Saturday, isn't walking as far as normal.  Her ankles were swollen.  At baseline her dementia is really bad, progressively worsening.  She broke her hip a year ago, healed well.  She fell another time, probably ready to stop Eliquis.  She may not need another echo, maybe Lasix periodically.  She is DNR.  She is open to palliative care, would prefer to take her back to [facility].      ER Course:  Unwitnessed fall.  Mild SOB for a few days with h/o CHF.  Sutured arm.  CHF mildly congested, no hypoxia, BNP 862, troponin 61.  Given 20 mg IV Lasix.  Daughter is [name], PCCM NP.    Review of Systems: unable to review all systems due to the inability of the patient to answer questions. Past Medical History:  Diagnosis Date   Atrial fibrillation ([place]) 10/06/2007   post op   COPD (chronic obstructive pulmonary disease) ([place])    Emphysema on 02/03/13 CXR   CORONARY ARTERY DISEASE 10/06/2007   a. s/p PCI to LAD; b. s/p CABG; c. LHC 07/13/12: Proximal LAD stent patent, LIMA-LAD atretic, D1 occluded, proximal circumflex 30%, mid RCA occluded, SVG-D1 normal, SVG-OM2 normal, SVG-distal RCA normal, EF 40% with diffuse HK   Dizziness    History of colonic polyps 10/30/2009   No polyps in 2011. No repeat.     HYPERLIPIDEMIA  10/06/2007   HYPERTENSION 10/06/2007   LBBB 09/06/2008   Left renal mass    MELANOMA 09/06/2008   MOES PROCEDURE RIGHT   NICM (nonischemic cardiomyopathy) ([place])    Echocardiogram 07/12/12: EF 25-30%, diffuse HK, mild AI, mild MR, mild LAE   PERSONAL HX COLONIC POLYPS 10/30/2009   Presence of permanent cardiac pacemaker    Syncope    UTERINE CANCER, HX OF 09/06/2008   VARICOSE VEIN 09/29/2009   Past Surgical History:  Procedure Laterality Date   ABDOMINAL HYSTERECTOMY  1988   BI-VENTRICULAR PACEMAKER INSERTION (CRT-P)  02/02/2013   St. Jude, serial no. #[id number]    CARDIAC CATHETERIZATION  08/30/2008   CORONARY ANGIOPLASTY WITH STENT PLACEMENT  2009   CORONARY ARTERY BYPASS GRAFT  2004   IR RADIOLOGIST EVAL & MGMT  02/08/2017   IR RADIOLOGIST EVAL & MGMT  05/26/2017   IR RADIOLOGIST EVAL & MGMT  07/19/2017   IR RADIOLOGIST EVAL & MGMT  05/04/2018   LEFT HEART CATHETERIZATION WITH CORONARY ANGIOGRAM Bilateral 07/13/2012   Procedure: LEFT HEART CATHETERIZATION WITH CORONARY ANGIOGRAM;  Surgeon: Peter M Martinique, MD;  Location: [facility] CATH LAB;  Service: Cardiovascular;  Laterality: Bilateral;   OOPHORECTOMY     PERMANENT PACEMAKER INSERTION N/A 02/02/2013   Procedure: PERMANENT PACEMAKER INSERTION;  Surgeon: Evans Lance, MD;  Location: [facility] CATH LAB;  Service: Cardiovascular;  Laterality: N/A;   RADIOLOGY WITH ANESTHESIA  Left 03/28/2017   Procedure: RENAL CRYO ABLATION;  Surgeon: Aletta Edouard, MD;  Location: [facility] ORS;  Service: Radiology;  Laterality: Left;   right distal pretibeal     melanoma   TOTAL HIP ARTHROPLASTY Right 09/04/2020   Procedure: ANTERIOR TOTAL HIP ARTHROPLASTY;  Surgeon: Rod Can, MD;  Location: [facility] ORS;  Service: Orthopedics;  Laterality: Right;   Social History:  reports that she has never smoked. She has never used smokeless tobacco. She reports current alcohol use. She reports that she does not use drugs.  Allergies  Allergen Reactions   Aricept [Donepezil]  Shortness Of Breath and Other (See Comments)    Hallucinations and loss of sensation in legs   Nitroglycerin Other (See Comments)    Oral spray form causes rapid drop in blood pressure.   sensitive to tabs which causes rapid drop in blood pressure. Is ok to use oral spray form    Family History  Problem Relation Age of Onset   Stroke Mother    Heart attack Father    Colon cancer Neg Hx     Prior to Admission medications   Medication Sig Start Date End Date Taking? Authorizing Provider  acetaminophen (TYLENOL) 500 MG tablet Take 1,000 mg by mouth every 8 (eight) hours as needed for fever, moderate pain or mild pain.   Yes [provider]  Cholecalciferol (VITAMIN D3 SUPER STRENGTH) 50 MCG (2000 UT) TABS Take 1 tablet by mouth daily.   Yes [provider]  ELIQUIS 5 MG TABS tablet TAKE 1 TABLET(5 MG) BY MOUTH TWICE DAILY Patient taking differently: Take 5 mg by mouth 2 (two) times daily. 09/25/19  Yes Deboraha Sprang, MD  escitalopram (LEXAPRO) 20 MG tablet TAKE 1 TABLET(20 MG) BY MOUTH DAILY 11/26/19  Yes Marin Olp, MD  lisinopril (ZESTRIL) 5 MG tablet Take 1 tablet (5 mg total) by mouth daily. 09/10/20  Yes Shawna Clamp, MD  Melatonin 10 MG CAPS Take 10 mg by mouth at bedtime.   Yes [provider]  memantine (NAMENDA) 5 MG tablet Take 5 mg by mouth 2 (two) times daily. 08/22/20  Yes [provider]  pyridostigmine (MESTINON) 60 MG tablet TAKE 1 TABLET(60 MG) BY MOUTH DAILY 08/06/19  Yes Marin Olp, MD  QUEtiapine (SEROQUEL) 25 MG tablet Take 25 mg by mouth at bedtime.   Yes [provider]  traMADol (ULTRAM) 50 MG tablet Take 25 mg by mouth 2 (two) times daily.   Yes [provider]  haloperidol (HALDOL) 0.5 MG tablet Take 1 tablet (0.5 mg total) by mouth 2 (two) times daily as needed for agitation (separate by at least 6 hours). Patient not taking: Reported on 09/21/2021 07/02/19   Marin Olp, MD    Physical  Exam: Vitals:   09/21/21 1300 09/21/21 1440 09/21/21 1447 09/21/21 1500  BP: (!) 153/107 (!) 191/83 (!) 176/71 (!) 162/124  Pulse: 68 70 70 69  Resp: 14 (!) 21 (!) 22 16  Temp:      TempSrc:      SpO2: 95% 94% 97% 95%   General:  Appears calm and comfortable and is in NAD, somewhat agitated Eyes:  PERRL, EOMI, normal lids, iris ENT:  grossly normal hearing, lips & tongue, mmm Neck:  no LAD, masses or thyromegaly Cardiovascular:  RRR, no m/r/g. No LE edema.  Respiratory:   CTA bilaterally with no wheezes/rales/rhonchi.  Mildly increased respiratory effort. Abdomen:  soft, NT, ND Skin:  no rash or induration seen  on limited exam; LUE is wrapped s/p repair Musculoskeletal:  grossly normal tone BUE/BLE, good ROM, no bony abnormality Psychiatric:  agitated/confused mood and affect, speech somewhat sparse, AOx1 Neurologic:  CN 2-12 grossly intact, moves all extremities in coordinated fashion   Radiological Exams on Admission: Independently reviewed - see discussion in A/P where applicable  CT Head Wo Contrast  Result Date: 09/21/2021 CLINICAL DATA:  Headache, trauma EXAM: CT HEAD WITHOUT CONTRAST CT CERVICAL SPINE WITHOUT CONTRAST TECHNIQUE: Multidetector CT imaging of the head and cervical spine was performed following the standard protocol without intravenous contrast. Multiplanar CT image reconstructions of the cervical spine were also generated. RADIATION DOSE REDUCTION: This exam was performed according to the departmental dose-optimization program which includes automated exposure control, adjustment of the mA and/or kV according to patient size and/or use of iterative reconstruction technique. COMPARISON:  CT head and cervical spine 09/01/2020 FINDINGS: CT HEAD FINDINGS Brain: There is no evidence of acute intracranial hemorrhage, extra-axial fluid collection, or acute infarct. There is mild-to-moderate global parenchymal volume loss with prominence of the ventricular system and  extra-axial CSF spaces, not significantly changed since the prior study from 2022. Patchy and confluent hypodensity throughout the subcortical and periventricular white matter likely reflects sequela of advanced chronic white matter microangiopathy. A probable small calcified meningioma overlying the right frontal lobe is unchanged. There is no other solid mass lesion. There is no mass effect or midline shift. Vascular: There is calcification of the bilateral cavernous ICAs. Skull: Normal. Negative for fracture or focal lesion. Sinuses/Orbits: The imaged paranasal sinuses are clear. Bilateral lens implants are in place. The globes and orbits are otherwise unremarkable. Other: None. CT CERVICAL SPINE FINDINGS Alignment: There is slight reversal of the normal cervical lordosis centered at C5, unchanged. There is no antero or retrolisthesis. There is no jumped or perched facets or other evidence of traumatic malalignment. Skull base and vertebrae: Skull base alignment is maintained. Vertebral body heights are preserved. There is no evidence of acute fracture. Ankylosis of the facet joints at C2 through C4 is unchanged. Soft tissues and spinal canal: No prevertebral fluid or swelling. No visible canal hematoma. Disc levels: Multilevel disc space narrowing and degenerative endplate changes again seen, most advanced at C4-C5 and C5-C6. Multilevel facet arthropathy is also similar to the prior study. There is no evidence of high-grade spinal canal stenosis. Upper chest: The imaged lung apices are clear. Other: None. IMPRESSION: 1. No acute intracranial pathology. 2. No acute fracture or traumatic malalignment of the cervical spine. Electronically Signed   By: [name] M.D.   On: 09/21/2021 14:14   CT Cervical Spine Wo Contrast  Result Date: 09/21/2021 CLINICAL DATA:  Headache, trauma EXAM: CT HEAD WITHOUT CONTRAST CT CERVICAL SPINE WITHOUT CONTRAST TECHNIQUE: Multidetector CT imaging of the head and cervical spine  was performed following the standard protocol without intravenous contrast. Multiplanar CT image reconstructions of the cervical spine were also generated. RADIATION DOSE REDUCTION: This exam was performed according to the departmental dose-optimization program which includes automated exposure control, adjustment of the mA and/or kV according to patient size and/or use of iterative reconstruction technique. COMPARISON:  CT head and cervical spine 09/01/2020 FINDINGS: CT HEAD FINDINGS Brain: There is no evidence of acute intracranial hemorrhage, extra-axial fluid collection, or acute infarct. There is mild-to-moderate global parenchymal volume loss with prominence of the ventricular system and extra-axial CSF spaces, not significantly changed since the prior study from 2022. Patchy and confluent hypodensity throughout the subcortical and periventricular white matter  likely reflects sequela of advanced chronic white matter microangiopathy. A probable small calcified meningioma overlying the right frontal lobe is unchanged. There is no other solid mass lesion. There is no mass effect or midline shift. Vascular: There is calcification of the bilateral cavernous ICAs. Skull: Normal. Negative for fracture or focal lesion. Sinuses/Orbits: The imaged paranasal sinuses are clear. Bilateral lens implants are in place. The globes and orbits are otherwise unremarkable. Other: None. CT CERVICAL SPINE FINDINGS Alignment: There is slight reversal of the normal cervical lordosis centered at C5, unchanged. There is no antero or retrolisthesis. There is no jumped or perched facets or other evidence of traumatic malalignment. Skull base and vertebrae: Skull base alignment is maintained. Vertebral body heights are preserved. There is no evidence of acute fracture. Ankylosis of the facet joints at C2 through C4 is unchanged. Soft tissues and spinal canal: No prevertebral fluid or swelling. No visible canal hematoma. Disc levels:  Multilevel disc space narrowing and degenerative endplate changes again seen, most advanced at C4-C5 and C5-C6. Multilevel facet arthropathy is also similar to the prior study. There is no evidence of high-grade spinal canal stenosis. Upper chest: The imaged lung apices are clear. Other: None. IMPRESSION: 1. No acute intracranial pathology. 2. No acute fracture or traumatic malalignment of the cervical spine. Electronically Signed   By: [name] M.D.   On: 09/21/2021 14:14   DG Shoulder Left  Result Date: 09/21/2021 CLINICAL DATA:  Status post fall, shoulder pain EXAM: LEFT SHOULDER - 2+ VIEW COMPARISON:  None Available. FINDINGS: There is no evidence of fracture or dislocation. There is no evidence of arthropathy or other focal bone abnormality. Soft tissues are unremarkable. IMPRESSION: Negative. Electronically Signed   By: Kathreen Devoid M.D.   On: 09/21/2021 13:51   DG Chest 1 View  Result Date: 09/21/2021 CLINICAL DATA:  Status post fall.  History of AFib. EXAM: CHEST  1 VIEW COMPARISON:  09/01/2020 FINDINGS: Bilateral mild interstitial thickening. No focal consolidation. No pleural effusion or pneumothorax. Enlarged central pulmonary vasculature. Stable cardiomegaly. Prior CABG. Dual lead cardiac pacemaker. No acute osseous abnormality. IMPRESSION: 1. Cardiomegaly with mild pulmonary vascular congestion. Electronically Signed   By: Kathreen Devoid M.D.   On: 09/21/2021 13:50   DG Pelvis 1-2 Views  Result Date: 09/21/2021 CLINICAL DATA:  Status post fall. EXAM: PELVIS - 1-2 VIEW COMPARISON:  None Available. FINDINGS: Generalized osteopenia. No acute fracture or dislocation. Right hip arthroplasty. No aggressive osseous lesion. Normal alignment. Soft tissue are unremarkable. No radiopaque foreign body or soft tissue emphysema. IMPRESSION: 1. No acute osseous injury of the pelvis. Electronically Signed   By: Kathreen Devoid M.D.   On: 09/21/2021 13:49    EKG: Independently reviewed.  NSR with rate 70;  prolonged QTc 534; IVCD; nonspecific ST changes with no evidence of acute ischemia, NSCSLT   Labs on Admission: I have personally reviewed the available labs and imaging studies at the time of the admission.  Pertinent labs:    Glucose 113 BNP 862.4 HS troponin 31 WBC 7.4 Hgb 11.3 Platelets 119   Assessment and Plan: Principal Problem:   Fall at nursing home, initial encounter Active Problems:   Essential hypertension   Chronic combined systolic and diastolic congestive heart failure (HCC)   Paroxysmal atrial fibrillation (HCC)   Pacemaker -CRT-St. Jude   CAD (coronary artery disease)   Dementia ([place])   DNR (do not resuscitate)    Fall -Patient in memory care, had unwitnessed fall -She is on [facility]  and this is her third fall; family agrees with stopping AC at this time -Her only obvious injury was a left upper arm laceration that has been repaired -While overnight observation was the initial plan, based on the patient's advanced dementia and her high likelihood for delirium outside of her usual environment, family prefers ER discharge tonight -We also discussed her disease trajectory and her daughter is open to palliative care outpatient referral ([facility] Knack will arrange) -She was given a tetanus booster  Acute on chronic combined CHF -Her last echo was on 09/02/20 and showed EF 35-40% with grade 2 diastolic dysfunction -She may have mild volume overload currently, although she is not hypoxic -She will receive a one-time dose of Lasix 20 mg IV in the ER prior to dc -Palliative care/PCP/cardiology can assist with ongoing medications as needed  Dementia -Patient has advanced dementia -She is currently agitated and this may worsen if she is brought into the hospital due to sundowning; her daughter prefers to take her back to her home environment tonight -Continue Lexapro, Seroquel, Melatonin, Namenda  Afib -Based on recurrent falls, it is reasonable to stop Eliquis at this  time -Family is aware of the increased stroke risk -She has a pacemaker  CAD -s/p CABG  HTN -Continue Lisinopril  DNR -Gold paperwork is at the bedside     Thank you for this interesting consult.  Based on the overall clinical situation with the patient at this time, a shared decision-making model with her daughter (PCCM NP) led to the decision to return the patient to her home (memory care) environment.  Eliquis will be discontinued due to recurrent falls.  She has been given a dose of Lasix.  Palliative care will be consulted for outpatient care.   Author: Karmen Bongo, MD 09/21/2021 5:20 PM  For on call review [email or web address].

## 2021-09-21 NOTE — Consult Note (Signed)
NEURO HOSPITALIST CONSULT NOTE   Requestig physician: Dr. Francia Greaves  Reason for Consult: Seizure-like activity  History obtained from:   Chart     HPI:                                                                                                                                          [name] [name] is an 88 y.o. female with an extensive PMHx including advanced dementia (on Lexapro, Seroquel, Melatonin and Namenda), orthostatic hypotension, CHF and atrial fibrillation (on Eliquis) who has presented twice to the ED today, initially for evaluation after an unwitnessed fall versus syncope, subsequently coming back to the ED after seizure-like activity was witnessed by family while on the way home following the first evaluation. While overnight observation was the initial plan, based on the patient's advanced dementia and her high likelihood for delirium outside of her usual environment, family elected for ER discharge at the time of the first evaluation.   While on the way home, family witnessed a syncopal event during which she also exhibited seizure-like activity with the following semiology: Eyes deviated to upper left, arms hyperextended, lasting for approximately 45 seconds.    After returning to the ED, she was hooked up to telemetry which revealed polymorphic ventricular tachycardia. She was loaded with amiodarone. Cardiology saw the patient and per their note, her syncope is felt most likely to be related to the ventricular tachycardia.    Past Medical History:  Diagnosis Date   Atrial fibrillation ([place]) 10/06/2007   post op   COPD (chronic obstructive pulmonary disease) ([place])    Emphysema on 02/03/13 CXR   CORONARY ARTERY DISEASE 10/06/2007   a. s/p PCI to LAD; b. s/p CABG; c. LHC 07/13/12: Proximal LAD stent patent, LIMA-LAD atretic, D1 occluded, proximal circumflex 30%, mid RCA occluded, SVG-D1 normal, SVG-OM2 normal, SVG-distal RCA normal, EF 40% with diffuse HK    Dizziness    History of colonic polyps 10/30/2009   No polyps in 2011. No repeat.     HYPERLIPIDEMIA 10/06/2007   HYPERTENSION 10/06/2007   LBBB 09/06/2008   Left renal mass    MELANOMA 09/06/2008   MOES PROCEDURE RIGHT   NICM (nonischemic cardiomyopathy) ([place])    Echocardiogram 07/12/12: EF 25-30%, diffuse HK, mild AI, mild MR, mild LAE   PERSONAL HX COLONIC POLYPS 10/30/2009   Presence of permanent cardiac pacemaker    Syncope    UTERINE CANCER, HX OF 09/06/2008   VARICOSE VEIN 09/29/2009    Past Surgical History:  Procedure Laterality Date   ABDOMINAL HYSTERECTOMY  1988   BI-VENTRICULAR PACEMAKER INSERTION (CRT-P)  02/02/2013   St[employer] Jude, serial no. #[id number]    CARDIAC CATHETERIZATION  08/30/2008   CORONARY ANGIOPLASTY WITH STENT PLACEMENT  2009   CORONARY ARTERY BYPASS  GRAFT  2004   IR RADIOLOGIST EVAL & MGMT  02/08/2017   IR RADIOLOGIST EVAL & MGMT  05/26/2017   IR RADIOLOGIST EVAL & MGMT  07/19/2017   IR RADIOLOGIST EVAL & MGMT  05/04/2018   LEFT HEART CATHETERIZATION WITH CORONARY ANGIOGRAM Bilateral 07/13/2012   Procedure: LEFT HEART CATHETERIZATION WITH CORONARY ANGIOGRAM;  Surgeon: Peter M Martinique, MD;  Location: [facility] CATH LAB;  Service: Cardiovascular;  Laterality: Bilateral;   OOPHORECTOMY     PERMANENT PACEMAKER INSERTION N/A 02/02/2013   Procedure: PERMANENT PACEMAKER INSERTION;  Surgeon: Evans Lance, MD;  Location: [facility] CATH LAB;  Service: Cardiovascular;  Laterality: N/A;   RADIOLOGY WITH ANESTHESIA Left 03/28/2017   Procedure: RENAL CRYO ABLATION;  Surgeon: Aletta Edouard, MD;  Location: [facility] ORS;  Service: Radiology;  Laterality: Left;   right distal pretibeal     melanoma   TOTAL HIP ARTHROPLASTY Right 09/04/2020   Procedure: ANTERIOR TOTAL HIP ARTHROPLASTY;  Surgeon: Rod Can, MD;  Location: [facility] ORS;  Service: Orthopedics;  Laterality: Right;    Family History  Problem Relation Age of Onset   Stroke Mother    Heart attack Father    Colon cancer Neg Hx               Social History:  reports that she has never smoked. She has never used smokeless tobacco. She reports current alcohol use. She reports that she does not use drugs.  Allergies  Allergen Reactions   Aricept [Donepezil] Shortness Of Breath and Other (See Comments)    Hallucinations and loss of sensation in legs   Nitroglycerin Other (See Comments)    Oral spray form causes rapid drop in blood pressure.   sensitive to tabs which causes rapid drop in blood pressure. Is ok to use oral spray form    MEDICATIONS:                                                                                                                     No current facility-administered medications on file prior to encounter.   Current Outpatient Medications on File Prior to Encounter  Medication Sig Dispense Refill   acetaminophen (TYLENOL) 500 MG tablet Take 1,000 mg by mouth in the morning and at bedtime.     Cholecalciferol (VITAMIN D3 SUPER STRENGTH) 50 MCG (2000 UT) TABS Take 2,000 Units by mouth daily.     ELIQUIS 5 MG TABS tablet TAKE 1 TABLET(5 MG) BY MOUTH TWICE DAILY (Patient taking differently: Take 5 mg by mouth in the morning and at bedtime.) 60 tablet 5   escitalopram (LEXAPRO) 20 MG tablet TAKE 1 TABLET(20 MG) BY MOUTH DAILY (Patient taking differently: Take 20 mg by mouth in the morning.) 90 tablet 1   lisinopril (ZESTRIL) 5 MG tablet Take 1 tablet (5 mg total) by mouth daily. 30 tablet 0   Melatonin 10 MG TABS Take 10 mg by mouth at bedtime.     memantine (NAMENDA) 5 MG tablet Take 5 mg by mouth  in the morning and at bedtime.     pyridostigmine (MESTINON) 60 MG tablet TAKE 1 TABLET(60 MG) BY MOUTH DAILY (Patient taking differently: Take 60 mg by mouth in the morning.) 90 tablet 2   QUEtiapine (SEROQUEL) 25 MG tablet Take 25 mg by mouth at bedtime.     traMADol (ULTRAM) 50 MG tablet Take 25 mg by mouth in the morning and at bedtime.     haloperidol (HALDOL) 0.5 MG tablet Take 1 tablet (0.5 mg total)  by mouth 2 (two) times daily as needed for agitation (separate by at least 6 hours). (Patient not taking: Reported on 09/21/2021) 40 tablet 1    Scheduled:  amiodarone  150 mg Intravenous Once   enoxaparin (LOVENOX) injection  40 mg Subcutaneous Q24H   Continuous:  amiodarone 60 mg/hr (09/21/21 2038)   Followed by   Derrill Memo ON 09/22/2021] amiodarone     potassium chloride 10 mEq (09/21/21 2204)     ROS:                                                                                                                                       Unable to obtain as patient is nonverbal.    Blood pressure (!) 166/110, pulse 70, temperature 98.7 F (37.1 C), temperature source Oral, resp. rate (!) 24, SpO2 100 %.   General Examination:                                                                                                       Physical Exam  HEENT-  Normocephalic, no lesions, without obvious abnormality.  Normal external eye and conjunctiva.   Cardiovascular- S1-S2 audible, pulses palpable throughout   Lungs-no rhonchi or wheezing noted, no excessive working breathing.  Saturations within normal limits Abdomen- All 4 quadrants palpated and nontender Extremities- Warm, dry and intact Musculoskeletal-no joint tenderness, deformity or swelling Skin-warm and dry, no hyperpigmentation, vitiligo, or suspicious lesions  Neurological Examination Mental Status: Alert, oriented, thought content appropriate.  Speech fluent without evidence of aphasia.  Able to follow 3 step commands without difficulty. Cranial Nerves: II: Discs flat bilaterally; Visual fields grossly normal,  III,IV, VI: ptosis not present, extra-ocular motions intact bilaterally pupils equal, round, reactive to light and accommodation V,VII: smile symmetric, facial light touch sensation normal bilaterally VIII: hearing normal bilaterally IX,X: uvula rises symmetrically XI: bilateral shoulder shrug XII: midline tongue  extension Motor: Right : Upper extremity   5/5    Left:  Upper extremity   5/5  Lower extremity   5/5     Lower extremity   5/5 Tone and bulk:normal tone throughout; no atrophy noted Sensory: Pinprick and light touch intact throughout, bilaterally Deep Tendon Reflexes: 2+ and symmetric throughout Plantars: Right: downgoing   Left: downgoing Cerebellar: normal finger-to-nose, normal rapid alternating movements and normal heel-to-shin test Gait: normal gait and station   Lab Results: Basic Metabolic Panel: Recent Labs  Lab 09/21/21 1432 09/21/21 1931 09/21/21 2001  NA 144 137 141  K 3.5 3.1* 3.1*  CL 107 101 104  CO2 27 23  --   GLUCOSE 113* 182* 177*  BUN '14 13 15  '$ CREATININE 0.91 0.97 0.90  CALCIUM 8.8* 8.7*  --     CBC: Recent Labs  Lab 09/21/21 1432 09/21/21 1931 09/21/21 2001  WBC 7.4 10.7*  --   NEUTROABS 6.0 8.1*  --   HGB 11.3* 12.2 13.6  HCT 35.8* 40.2 40.0  MCV 97.8 99.0  --   PLT 119* 128*  --     Cardiac Enzymes: No results for input(s): "CKTOTAL", "CKMB", "CKMBINDEX", "TROPONINI" in the last 168 hours.  Lipid Panel: No results for input(s): "CHOL", "TRIG", "HDL", "CHOLHDL", "VLDL", "LDLCALC" in the last 168 hours.  Imaging: CT Head Wo Contrast  Result Date: 09/21/2021 CLINICAL DATA:  Headache, trauma EXAM: CT HEAD WITHOUT CONTRAST CT CERVICAL SPINE WITHOUT CONTRAST TECHNIQUE: Multidetector CT imaging of the head and cervical spine was performed following the standard protocol without intravenous contrast. Multiplanar CT image reconstructions of the cervical spine were also generated. RADIATION DOSE REDUCTION: This exam was performed according to the departmental dose-optimization program which includes automated exposure control, adjustment of the mA and/or kV according to patient size and/or use of iterative reconstruction technique. COMPARISON:  CT head and cervical spine 09/01/2020 FINDINGS: CT HEAD FINDINGS Brain: There is no evidence of acute  intracranial hemorrhage, extra-axial fluid collection, or acute infarct. There is mild-to-moderate global parenchymal volume loss with prominence of the ventricular system and extra-axial CSF spaces, not significantly changed since the prior study from 2022. Patchy and confluent hypodensity throughout the subcortical and periventricular white matter likely reflects sequela of advanced chronic white matter microangiopathy. A probable small calcified meningioma overlying the right frontal lobe is unchanged. There is no other solid mass lesion. There is no mass effect or midline shift. Vascular: There is calcification of the bilateral cavernous ICAs. Skull: Normal. Negative for fracture or focal lesion. Sinuses/Orbits: The imaged paranasal sinuses are clear. Bilateral lens implants are in place. The globes and orbits are otherwise unremarkable. Other: None. CT CERVICAL SPINE FINDINGS Alignment: There is slight reversal of the normal cervical lordosis centered at C5, unchanged. There is no antero or retrolisthesis. There is no jumped or perched facets or other evidence of traumatic malalignment. Skull base and vertebrae: Skull base alignment is maintained. Vertebral body heights are preserved. There is no evidence of acute fracture. Ankylosis of the facet joints at C2 through C4 is unchanged. Soft tissues and spinal canal: No prevertebral fluid or swelling. No visible canal hematoma. Disc levels: Multilevel disc space narrowing and degenerative endplate changes again seen, most advanced at C4-C5 and C5-C6. Multilevel facet arthropathy is also similar to the prior study. There is no evidence of high-grade spinal canal stenosis. Upper chest: The imaged lung apices are clear. Other: None. IMPRESSION: 1. No acute intracranial pathology. 2. No acute fracture or traumatic malalignment of the cervical spine. Electronically Signed   By: Court Joy.D.  On: 09/21/2021 14:14   CT Cervical Spine Wo Contrast  Result Date:  09/21/2021 CLINICAL DATA:  Headache, trauma EXAM: CT HEAD WITHOUT CONTRAST CT CERVICAL SPINE WITHOUT CONTRAST TECHNIQUE: Multidetector CT imaging of the head and cervical spine was performed following the standard protocol without intravenous contrast. Multiplanar CT image reconstructions of the cervical spine were also generated. RADIATION DOSE REDUCTION: This exam was performed according to the departmental dose-optimization program which includes automated exposure control, adjustment of the mA and/or kV according to patient size and/or use of iterative reconstruction technique. COMPARISON:  CT head and cervical spine 09/01/2020 FINDINGS: CT HEAD FINDINGS Brain: There is no evidence of acute intracranial hemorrhage, extra-axial fluid collection, or acute infarct. There is mild-to-moderate global parenchymal volume loss with prominence of the ventricular system and extra-axial CSF spaces, not significantly changed since the prior study from 2022. Patchy and confluent hypodensity throughout the subcortical and periventricular white matter likely reflects sequela of advanced chronic white matter microangiopathy. A probable small calcified meningioma overlying the right frontal lobe is unchanged. There is no other solid mass lesion. There is no mass effect or midline shift. Vascular: There is calcification of the bilateral cavernous ICAs. Skull: Normal. Negative for fracture or focal lesion. Sinuses/Orbits: The imaged paranasal sinuses are clear. Bilateral lens implants are in place. The globes and orbits are otherwise unremarkable. Other: None. CT CERVICAL SPINE FINDINGS Alignment: There is slight reversal of the normal cervical lordosis centered at C5, unchanged. There is no antero or retrolisthesis. There is no jumped or perched facets or other evidence of traumatic malalignment. Skull base and vertebrae: Skull base alignment is maintained. Vertebral body heights are preserved. There is no evidence of acute  fracture. Ankylosis of the facet joints at C2 through C4 is unchanged. Soft tissues and spinal canal: No prevertebral fluid or swelling. No visible canal hematoma. Disc levels: Multilevel disc space narrowing and degenerative endplate changes again seen, most advanced at C4-C5 and C5-C6. Multilevel facet arthropathy is also similar to the prior study. There is no evidence of high-grade spinal canal stenosis. Upper chest: The imaged lung apices are clear. Other: None. IMPRESSION: 1. No acute intracranial pathology. 2. No acute fracture or traumatic malalignment of the cervical spine. Electronically Signed   By: [name] M.D.   On: 09/21/2021 14:14   DG Shoulder Left  Result Date: 09/21/2021 CLINICAL DATA:  Status post fall, shoulder pain EXAM: LEFT SHOULDER - 2+ VIEW COMPARISON:  None Available. FINDINGS: There is no evidence of fracture or dislocation. There is no evidence of arthropathy or other focal bone abnormality. Soft tissues are unremarkable. IMPRESSION: Negative. Electronically Signed   By: Kathreen Devoid M.D.   On: 09/21/2021 13:51   DG Chest 1 View  Result Date: 09/21/2021 CLINICAL DATA:  Status post fall.  History of AFib. EXAM: CHEST  1 VIEW COMPARISON:  09/01/2020 FINDINGS: Bilateral mild interstitial thickening. No focal consolidation. No pleural effusion or pneumothorax. Enlarged central pulmonary vasculature. Stable cardiomegaly. Prior CABG. Dual lead cardiac pacemaker. No acute osseous abnormality. IMPRESSION: 1. Cardiomegaly with mild pulmonary vascular congestion. Electronically Signed   By: Kathreen Devoid M.D.   On: 09/21/2021 13:50   DG Pelvis 1-2 Views  Result Date: 09/21/2021 CLINICAL DATA:  Status post fall. EXAM: PELVIS - 1-2 VIEW COMPARISON:  None Available. FINDINGS: Generalized osteopenia. No acute fracture or dislocation. Right hip arthroplasty. No aggressive osseous lesion. Normal alignment. Soft tissue are unremarkable. No radiopaque foreign body or soft tissue emphysema.  IMPRESSION: 1. No acute  osseous injury of the pelvis. Electronically Signed   By: Kathreen Devoid M.D.   On: 09/21/2021 13:49     Assessment: 88 year old female with a history of dementia who presented twice to the ED today, initially for evaluation after an unwitnessed fall versus syncope, the second visit after seizure-like activity was witnessed by family while on the way home following the first evaluation 1. Exam reveals an elderly female who can be aroused to an awake, nonverbal state with noxious stimuli. No focal weakness noted. Noxious stimuli elicit intermittent asynchronous tremoring/jerking of her upper and lower extremities with a component appearing most consistent with asterixis.  2. CT head: Mild-to-moderate global parenchymal volume loss with prominence of the ventricular system and extra-axial CSF spaces, not significantly changed since the prior study from 2022. Patchy and confluent hypodensity throughout the subcortical and periventricular white matter likely reflects sequela of advanced chronic white matter microangiopathy. A probable small calcified meningioma overlying the right frontal lobe is unchanged 3. Has a pacemaker.  4. Overall presentation is most consistent with convulsive syncope. Primary etiology is most likely cardiac. Her tremoring may be due to agitation in the context of a possible developing delirium (DDx hospital delirium versus infection). Her asterixis-like movements could be due to an underlying toxic/metabolic encephalopathy; relatively small insults can result in delirium and other neurological changes in severely demented patients. Her movements during exam do not militate in favor of seizure activity.   Recommendations: 1. Hold off on initiating an anticonvulsant at this time.  2. EEG in the morning.  3. Toxic/metabolic/infectious work up.  4. Minimize/avoid sedating and deliriogenic medications including benzodiazepines, opiates and anticholinergics.  5.  Continue her memantine.  6. She is on [facility] and this is her third fall. Family agrees with stopping AC at this time 7. Discontinue her home Tramadol as it can lower the seizure threshold.   8. Management of her orthostatic hypotension and arrhythmia per Cardiology.   Electronically signed: Dr. Kerney Elbe 09/21/2021, 8:59 PM

## 2021-09-21 NOTE — Consult Note (Signed)
Cardiology Consultation:   Patient ID: [name] [name]; [id number]; [birth year]   Admit date: 09/21/2021 Date of Consult: 09/21/2021  Primary Care Provider: System, Provider Not In Primary Cardiologist: [name] Axe, MD  Primary Electrophysiologist:  [name] Axe, MD    Patient Profile:   [name] [name] is a 88 y.o. female with a hx of ischemic cardiomypathy who is being seen today for the evaluation of syncope and VT at the request of Dr. Hal Hope.  History of Present Illness:   Ms. Renner she previously saw Dr. Caryl Comes but not since 2020.  She has orthostatic hypotension.  She has had intermittent high-grade heart block. She underwent CRT P. She has evolved device dependence She has ischemic heart disease with prior MI and bypass surgery;,prior PCI to the LAD and previously.  EF 25-30%.     She has severe dementia.  Her daughter is [name] Form PA with [facility].  She reports increasing leg swelling and dyspnea with exertion.  She had an unwitnessed fall and was in the ED today but they did not want to admit her.  She had evidence of volume overload and was given IV Lasix.   When she was being driven home she had another syncopal event in which she looks like she might have been seizing.  She did this again in the waiting room and a third time while hooked up to tele.  This time she was noted to have polymorphic VT on tele.  She was loaded with AV amiodarone.     At her memory unit she gets around with help and usually a walker.  She goes to the dining hall for meals.  She has had some increased dyspnea and wheezing.  She had a little ankle edema.  However, there is been no distress.  She does sleep on her couch now which is unusual rather than in her bed.  Unfortunately patient is almost nonverbal and cannot answer questions about how she is feeling.    Past Medical History:  Diagnosis Date   Atrial fibrillation ([place]) 10/06/2007   post op   COPD (chronic obstructive pulmonary  disease) ([place])    Emphysema on 02/03/13 CXR   CORONARY ARTERY DISEASE 10/06/2007   a. s/p PCI to LAD; b. s/p CABG; c. LHC 07/13/12: Proximal LAD stent patent, LIMA-LAD atretic, D1 occluded, proximal circumflex 30%, mid RCA occluded, SVG-D1 normal, SVG-OM2 normal, SVG-distal RCA normal, EF 40% with diffuse HK   Dizziness    History of colonic polyps 10/30/2009   No polyps in 2011. No repeat.     HYPERLIPIDEMIA 10/06/2007   HYPERTENSION 10/06/2007   LBBB 09/06/2008   Left renal mass    MELANOMA 09/06/2008   MOES PROCEDURE RIGHT   NICM (nonischemic cardiomyopathy) ([place])    Echocardiogram 07/12/12: EF 25-30%, diffuse HK, mild AI, mild MR, mild LAE   PERSONAL HX COLONIC POLYPS 10/30/2009   Presence of permanent cardiac pacemaker    Syncope    UTERINE CANCER, HX OF 09/06/2008   VARICOSE VEIN 09/29/2009    Past Surgical History:  Procedure Laterality Date   ABDOMINAL HYSTERECTOMY  1988   BI-VENTRICULAR PACEMAKER INSERTION (CRT-P)  02/02/2013   St. Jude, serial no. #[id number]    CARDIAC CATHETERIZATION  08/30/2008   CORONARY ANGIOPLASTY WITH STENT PLACEMENT  2009   CORONARY ARTERY BYPASS GRAFT  2004   IR RADIOLOGIST EVAL & MGMT  02/08/2017   IR RADIOLOGIST EVAL & MGMT  05/26/2017   IR RADIOLOGIST EVAL & MGMT  07/19/2017  IR RADIOLOGIST EVAL & MGMT  05/04/2018   LEFT HEART CATHETERIZATION WITH CORONARY ANGIOGRAM Bilateral 07/13/2012   Procedure: LEFT HEART CATHETERIZATION WITH CORONARY ANGIOGRAM;  Surgeon: Peter M Martinique, MD;  Location: [facility] CATH LAB;  Service: Cardiovascular;  Laterality: Bilateral;   OOPHORECTOMY     PERMANENT PACEMAKER INSERTION N/A 02/02/2013   Procedure: PERMANENT PACEMAKER INSERTION;  Surgeon: Evans Lance, MD;  Location: [facility] CATH LAB;  Service: Cardiovascular;  Laterality: N/A;   RADIOLOGY WITH ANESTHESIA Left 03/28/2017   Procedure: RENAL CRYO ABLATION;  Surgeon: Aletta Edouard, MD;  Location: [facility] ORS;  Service: Radiology;  Laterality: Left;   right distal pretibeal      melanoma   TOTAL HIP ARTHROPLASTY Right 09/04/2020   Procedure: ANTERIOR TOTAL HIP ARTHROPLASTY;  Surgeon: Rod Can, MD;  Location: [facility] ORS;  Service: Orthopedics;  Laterality: Right;       Inpatient Medications: Scheduled Meds:  amiodarone  150 mg Intravenous Once   Continuous Infusions:  amiodarone     amiodarone     Followed by   Derrill Memo ON 09/22/2021] amiodarone     PRN Meds: amiodarone  Allergies:    Allergies  Allergen Reactions   Aricept [Donepezil] Shortness Of Breath and Other (See Comments)    Hallucinations and loss of sensation in legs   Nitroglycerin Other (See Comments)    Oral spray [name] causes rapid drop in blood pressure.   sensitive to tabs which causes rapid drop in blood pressure. Is ok to use oral spray [name]    Social History:   Social History   Socioeconomic History   Marital status: Widowed    Spouse name: Not on file   Number of children: Not on file   Years of education: Not on file   Highest education level: Not on file  Occupational History   Not on file  Tobacco Use   Smoking status: Never   Smokeless tobacco: Never  Vaping Use   Vaping Use: Never used  Substance and Sexual Activity   Alcohol use: Yes    Comment:  OCC gin or wine   Drug use: No   Sexual activity: Not on file  Other Topics Concern   Not on file  Social History Narrative   Widowed. Lives alone with new cat as of 2018. 3 children. 6 grandkids   Daughter  (NP) and son in Sports coach (pharmacist-teaches at [employer]) that live close and help out.       Drives in the daytime, shops for herself, cleaning-has someone over to clean due to the intermittent lightheadedness, bathing      Retired from physical education.       Social Determinants of Health   Financial Resource Strain: Not on file  Food Insecurity: Not on file  Transportation Needs: Not on file  Physical Activity: Not on file  Stress: Not on file  Social Connections: Not on file  Intimate Partner Violence:  Not on file    Family History:    Family History  Problem Relation Age of Onset   Stroke Mother    Heart attack Father    Colon cancer Neg Hx      ROS:  Please see the history of present illness.  ROS  All other ROS reviewed and negative.     Physical Exam/Data:   Vitals:   09/21/21 1917 09/21/21 1933  BP: (!) 188/88 (!) 171/110  Pulse: 97 83  Resp: 20 (!) 21  Temp: 98.7 F (37.1 C)   TempSrc:  Oral   SpO2: 92% 98%   No intake or output data in the 24 hours ending 09/21/21 2000 There were no vitals filed for this visit. There is no height or weight on file to calculate BMI.  GENERAL:  Chronically ill appearing and frail NECK:  No  jugular venous distention, waveform within normal limits, carotid upstroke brisk and symmetric, no bruits, no thyromegaly LYMPHATICS:  No cervical, inguinal adenopathy LUNGS:  Decreased breath sounds.  No obvious crackles but difficult exam.  BACK:  No CVA tenderness CHEST:  Well healed sternotomy scar and ICD pocket.  HEART:  PMI not displaced or sustained,S1 and S2 within normal limits, no S3, no S4 no clicks, no rubs, no murmurs ABD:  Flat, positive bowel sounds normal in frequency in pitch, no bruits, no rebound, no guarding, no midline pulsatile mass, no hepatomegaly, no splenomegaly EXT:  2 plus pulses throughout,   trace edema, no cyanosis no clubbing SKIN:  No rashes no nodules NEURO:  Nonfocal PSYCH:    Cognitively intact, oriented to person place and time   EKG:  The EKG was personally reviewed and demonstrates:   Sinus rhythm with first degree AV block with 100% ventricular pacing.   Telemetry:  Telemetry was personally reviewed and demonstrates:  Frequent ventricular ectopy.    Relevant CV Studies: NA  Laboratory Data:  Chemistry Recent Labs  Lab 09/21/21 1432  NA 144  K 3.5  CL 107  CO2 27  GLUCOSE 113*  BUN 14  CREATININE 0.91  CALCIUM 8.8*  GFRNONAA >60  ANIONGAP 10    No results for input(s): "PROT",  "ALBUMIN", "AST", "ALT", "ALKPHOS", "BILITOT" in the last 168 hours. Hematology Recent Labs  Lab 09/21/21 1432  WBC 7.4  RBC 3.66*  HGB 11.3*  HCT 35.8*  MCV 97.8  MCH 30.9  MCHC 31.6  RDW 15.1  PLT 119*   Cardiac EnzymesNo results for input(s): "TROPONINI" in the last 168 hours. No results for input(s): "TROPIPOC" in the last 168 hours.  BNP Recent Labs  Lab 09/21/21 1432  BNP 862.4*    DDimer No results for input(s): "DDIMER" in the last 168 hours.  Radiology/Studies:  CT Head Wo Contrast  Result Date: 09/21/2021 CLINICAL DATA:  Headache, trauma EXAM: CT HEAD WITHOUT CONTRAST CT CERVICAL SPINE WITHOUT CONTRAST TECHNIQUE: Multidetector CT imaging of the head and cervical spine was performed following the standard protocol without intravenous contrast. Multiplanar CT image reconstructions of the cervical spine were also generated. RADIATION DOSE REDUCTION: This exam was performed according to the departmental dose-optimization program which includes automated exposure control, adjustment of the mA and/or kV according to patient size and/or use of iterative reconstruction technique. COMPARISON:  CT head and cervical spine 09/01/2020 FINDINGS: CT HEAD FINDINGS Brain: There is no evidence of acute intracranial hemorrhage, extra-axial fluid collection, or acute infarct. There is mild-to-moderate global parenchymal volume loss with prominence of the ventricular system and extra-axial CSF spaces, not significantly changed since the prior study from 2022. Patchy and confluent hypodensity throughout the subcortical and periventricular white matter likely reflects sequela of advanced chronic white matter microangiopathy. A probable small calcified meningioma overlying the right frontal lobe is unchanged. There is no other solid mass lesion. There is no mass effect or midline shift. Vascular: There is calcification of the bilateral cavernous ICAs. Skull: Normal. Negative for fracture or focal  lesion. Sinuses/Orbits: The imaged paranasal sinuses are clear. Bilateral lens implants are in place. The globes and orbits are otherwise unremarkable. Other: None.  CT CERVICAL SPINE FINDINGS Alignment: There is slight reversal of the normal cervical lordosis centered at C5, unchanged. There is no antero or retrolisthesis. There is no jumped or perched facets or other evidence of traumatic malalignment. Skull base and vertebrae: Skull base alignment is maintained. Vertebral body heights are preserved. There is no evidence of acute fracture. Ankylosis of the facet joints at C2 through C4 is unchanged. Soft tissues and spinal canal: No prevertebral fluid or swelling. No visible canal hematoma. Disc levels: Multilevel disc space narrowing and degenerative endplate changes again seen, most advanced at C4-C5 and C5-C6. Multilevel facet arthropathy is also similar to the prior study. There is no evidence of high-grade spinal canal stenosis. Upper chest: The imaged lung apices are clear. Other: None. IMPRESSION: 1. No acute intracranial pathology. 2. No acute fracture or traumatic malalignment of the cervical spine. Electronically Signed   By: [name] M.D.   On: 09/21/2021 14:14   CT Cervical Spine Wo Contrast  Result Date: 09/21/2021 CLINICAL DATA:  Headache, trauma EXAM: CT HEAD WITHOUT CONTRAST CT CERVICAL SPINE WITHOUT CONTRAST TECHNIQUE: Multidetector CT imaging of the head and cervical spine was performed following the standard protocol without intravenous contrast. Multiplanar CT image reconstructions of the cervical spine were also generated. RADIATION DOSE REDUCTION: This exam was performed according to the departmental dose-optimization program which includes automated exposure control, adjustment of the mA and/or kV according to patient size and/or use of iterative reconstruction technique. COMPARISON:  CT head and cervical spine 09/01/2020 FINDINGS: CT HEAD FINDINGS Brain: There is no evidence of acute  intracranial hemorrhage, extra-axial fluid collection, or acute infarct. There is mild-to-moderate global parenchymal volume loss with prominence of the ventricular system and extra-axial CSF spaces, not significantly changed since the prior study from 2022. Patchy and confluent hypodensity throughout the subcortical and periventricular white matter likely reflects sequela of advanced chronic white matter microangiopathy. A probable small calcified meningioma overlying the right frontal lobe is unchanged. There is no other solid mass lesion. There is no mass effect or midline shift. Vascular: There is calcification of the bilateral cavernous ICAs. Skull: Normal. Negative for fracture or focal lesion. Sinuses/Orbits: The imaged paranasal sinuses are clear. Bilateral lens implants are in place. The globes and orbits are otherwise unremarkable. Other: None. CT CERVICAL SPINE FINDINGS Alignment: There is slight reversal of the normal cervical lordosis centered at C5, unchanged. There is no antero or retrolisthesis. There is no jumped or perched facets or other evidence of traumatic malalignment. Skull base and vertebrae: Skull base alignment is maintained. Vertebral body heights are preserved. There is no evidence of acute fracture. Ankylosis of the facet joints at C2 through C4 is unchanged. Soft tissues and spinal canal: No prevertebral fluid or swelling. No visible canal hematoma. Disc levels: Multilevel disc space narrowing and degenerative endplate changes again seen, most advanced at C4-C5 and C5-C6. Multilevel facet arthropathy is also similar to the prior study. There is no evidence of high-grade spinal canal stenosis. Upper chest: The imaged lung apices are clear. Other: None. IMPRESSION: 1. No acute intracranial pathology. 2. No acute fracture or traumatic malalignment of the cervical spine. Electronically Signed   By: [name] M.D.   On: 09/21/2021 14:14   DG Shoulder Left  Result Date:  09/21/2021 CLINICAL DATA:  Status post fall, shoulder pain EXAM: LEFT SHOULDER - 2+ VIEW COMPARISON:  None Available. FINDINGS: There is no evidence of fracture or dislocation. There is no evidence of arthropathy or other focal bone abnormality.  Soft tissues are unremarkable. IMPRESSION: Negative. Electronically Signed   By: Kathreen Devoid M.D.   On: 09/21/2021 13:51   DG Chest 1 View  Result Date: 09/21/2021 CLINICAL DATA:  Status post fall.  History of AFib. EXAM: CHEST  1 VIEW COMPARISON:  09/01/2020 FINDINGS: Bilateral mild interstitial thickening. No focal consolidation. No pleural effusion or pneumothorax. Enlarged central pulmonary vasculature. Stable cardiomegaly. Prior CABG. Dual lead cardiac pacemaker. No acute osseous abnormality. IMPRESSION: 1. Cardiomegaly with mild pulmonary vascular congestion. Electronically Signed   By: Kathreen Devoid M.D.   On: 09/21/2021 13:50   DG Pelvis 1-2 Views  Result Date: 09/21/2021 CLINICAL DATA:  Status post fall. EXAM: PELVIS - 1-2 VIEW COMPARISON:  None Available. FINDINGS: Generalized osteopenia. No acute fracture or dislocation. Right hip arthroplasty. No aggressive osseous lesion. Normal alignment. Soft tissue are unremarkable. No radiopaque foreign body or soft tissue emphysema. IMPRESSION: 1. No acute osseous injury of the pelvis. Electronically Signed   By: Kathreen Devoid M.D.   On: 09/21/2021 13:49    Assessment and Plan:   SYNCOPE:  Likely related to ventricular tachycardia.  Agree with IV amiodarone load and drip.  Change to PO.  Treat possible UTI and supplement potassium if iStat is confirmed.  Check magnesium.  Otherwise no further work up.  Patient is a DNR and the plan is to transition back to memory care as soon as possible.    CRT:   Device check.    ATRIAL FIB:  OK to stop the Eliquis.       For questions or updates, please contact [place] Please consult [email or web address] for contact info under Cardiology/STEMI.   Signed, Minus Breeding, MD  09/21/2021 8:00 PM

## 2021-09-21 NOTE — H&P (Addendum)
History and Physical    [name] [name] DXI:[id number] DOB: 05-10-1931 DOA: 09/21/2021  PCP: System, Provider Not In  Patient coming from: Patient is from dementia unit.  Chief Complaint: Possible seizures.  History obtained from patient's daughter.  HPI: [name] [name] is a 88 y.o. female with history of advanced dementia, CAD status post stenting, CHF, orthostatic hypotension, COPD who had earlier come to the ER after a fall was eventually discharged when on the way patient's daughter found that patient was having seizure-like activity during which patient did lose consciousness that lasted for 45 seconds.  Patient was brought back to the ER.  During the earlier visit patient was given IV Lasix for possible fluid overload.  ED Course: In the ER patient had V. tach.  Cardiologist on-call was consulted and patient was started on amiodarone infusion.  At this time patient's family confirms DNR but okay with starting amiodarone.  Patient's potassium was low has been started on IV potassium replacement.  While in the ER patient did have some myoclonic activities of the extremities.  Neurologist was consulted.  Review of Systems: As per HPI, rest all negative.   Past Medical History:  Diagnosis Date   Atrial fibrillation ([place]) 10/06/2007   post op   COPD (chronic obstructive pulmonary disease) ([place])    Emphysema on 02/03/13 CXR   CORONARY ARTERY DISEASE 10/06/2007   a. s/p PCI to LAD; b. s/p CABG; c. LHC 07/13/12: Proximal LAD stent patent, LIMA-LAD atretic, D1 occluded, proximal circumflex 30%, mid RCA occluded, SVG-D1 normal, SVG-OM2 normal, SVG-distal RCA normal, EF 40% with diffuse HK   Dizziness    History of colonic polyps 10/30/2009   No polyps in 2011. No repeat.     HYPERLIPIDEMIA 10/06/2007   HYPERTENSION 10/06/2007   LBBB 09/06/2008   Left renal mass    MELANOMA 09/06/2008   MOES PROCEDURE RIGHT   NICM (nonischemic cardiomyopathy) ([place])    Echocardiogram 07/12/12: EF 25-30%,  diffuse HK, mild AI, mild MR, mild LAE   PERSONAL HX COLONIC POLYPS 10/30/2009   Presence of permanent cardiac pacemaker    Syncope    UTERINE CANCER, HX OF 09/06/2008   VARICOSE VEIN 09/29/2009    Past Surgical History:  Procedure Laterality Date   ABDOMINAL HYSTERECTOMY  1988   BI-VENTRICULAR PACEMAKER INSERTION (CRT-P)  02/02/2013   St. Jude, serial no. #[id number]    CARDIAC CATHETERIZATION  08/30/2008   CORONARY ANGIOPLASTY WITH STENT PLACEMENT  2009   CORONARY ARTERY BYPASS GRAFT  2004   IR RADIOLOGIST EVAL & MGMT  02/08/2017   IR RADIOLOGIST EVAL & MGMT  05/26/2017   IR RADIOLOGIST EVAL & MGMT  07/19/2017   IR RADIOLOGIST EVAL & MGMT  05/04/2018   LEFT HEART CATHETERIZATION WITH CORONARY ANGIOGRAM Bilateral 07/13/2012   Procedure: LEFT HEART CATHETERIZATION WITH CORONARY ANGIOGRAM;  Surgeon: Peter M Martinique, MD;  Location: [facility] CATH LAB;  Service: Cardiovascular;  Laterality: Bilateral;   OOPHORECTOMY     PERMANENT PACEMAKER INSERTION N/A 02/02/2013   Procedure: PERMANENT PACEMAKER INSERTION;  Surgeon: Evans Lance, MD;  Location: [facility] CATH LAB;  Service: Cardiovascular;  Laterality: N/A;   RADIOLOGY WITH ANESTHESIA Left 03/28/2017   Procedure: RENAL CRYO ABLATION;  Surgeon: Aletta Edouard, MD;  Location: [facility] ORS;  Service: Radiology;  Laterality: Left;   right distal pretibeal     melanoma   TOTAL HIP ARTHROPLASTY Right 09/04/2020   Procedure: ANTERIOR TOTAL HIP ARTHROPLASTY;  Surgeon: Rod Can, MD;  Location: [facility] ORS;  Service: Orthopedics;  Laterality: Right;     reports that she has never smoked. She has never used smokeless tobacco. She reports current alcohol use. She reports that she does not use drugs.  Allergies  Allergen Reactions   Aricept [Donepezil] Shortness Of Breath and Other (See Comments)    Hallucinations and loss of sensation in legs and "Allergic," per [facility]   Nitroglycerin Other (See Comments)    Oral spray form causes rapid drop in blood pressure. Patient  is sensitive to tabs which causes rapid drop in blood pressure. Is ok to use oral spray form. "Allergic," per [facility]    Family History  Problem Relation Age of Onset   Stroke Mother    Heart attack Father    Colon cancer Neg Hx     Prior to Admission medications   Medication Sig Start Date End Date Taking? Authorizing Provider  acetaminophen (TYLENOL) 500 MG tablet Take 1,000 mg by mouth in the morning and at bedtime.   Yes [provider]  ELIQUIS 5 MG TABS tablet TAKE 1 TABLET(5 MG) BY MOUTH TWICE DAILY Patient taking differently: Take 5 mg by mouth in the morning and at bedtime. 09/25/19  Yes Deboraha Sprang, MD  escitalopram (LEXAPRO) 20 MG tablet TAKE 1 TABLET(20 MG) BY MOUTH DAILY Patient taking differently: Take 20 mg by mouth in the morning. 11/26/19  Yes Marin Olp, MD  lisinopril (ZESTRIL) 5 MG tablet Take 1 tablet (5 mg total) by mouth daily. 09/10/20  Yes Shawna Clamp, MD  Melatonin 10 MG TABS Take 10 mg by mouth at bedtime.   Yes [provider]  Cholecalciferol (VITAMIN D3 SUPER STRENGTH) 50 MCG (2000 UT) TABS Take 1 tablet by mouth daily.    [provider]  haloperidol (HALDOL) 0.5 MG tablet Take 1 tablet (0.5 mg total) by mouth 2 (two) times daily as needed for agitation (separate by at least 6 hours). Patient not taking: Reported on 09/21/2021 07/02/19   Marin Olp, MD  memantine (NAMENDA) 5 MG tablet Take 5 mg by mouth 2 (two) times daily. 08/22/20   [provider]  pyridostigmine (MESTINON) 60 MG tablet TAKE 1 TABLET(60 MG) BY MOUTH DAILY 08/06/19   Marin Olp, MD  QUEtiapine (SEROQUEL) 25 MG tablet Take 25 mg by mouth at bedtime.    [provider]  traMADol (ULTRAM) 50 MG tablet Take 25 mg by mouth 2 (two) times daily.    [provider]    Physical Exam: Constitutional: Moderately built and nourished. Vitals:   09/21/21 2010 09/21/21 2020 09/21/21 2030 09/21/21 2100  BP: (!) 174/79 (!) 166/110  (!) 174/77 (!) 160/84  Pulse:  70 70 70  Resp: (!) 27 (!) 24 20 (!) 21  Temp:      TempSrc:      SpO2: 97% 100% 100% 99%   Eyes: Anicteric no pallor. ENMT: No discharge from the ears eyes nose and mouth. Neck: No mass felt.  No neck rigidity. Respiratory: No rhonchi or crepitations. Cardiovascular: S1-S2 heard. Abdomen: Soft nontender bowel sound present. Musculoskeletal: No edema. Skin: No rash. Neurologic: Patient has been sedated with Ativan at the time of my exam. Psychiatric: Was sedated at the time of my exam.   Labs on Admission: I have personally reviewed following labs and imaging studies  CBC: Recent Labs  Lab 09/21/21 1432 09/21/21 1931 09/21/21 2001  WBC 7.4 10.7*  --   NEUTROABS 6.0 8.1*  --   HGB 11.3* 12.2 13.6  HCT  35.8* 40.2 40.0  MCV 97.8 99.0  --   PLT 119* 128*  --    Basic Metabolic Panel: Recent Labs  Lab 09/21/21 1432 09/21/21 1931 09/21/21 1955 09/21/21 2001  NA 144 137  --  141  K 3.5 3.1*  --  3.1*  CL 107 101  --  104  CO2 27 23  --   --   GLUCOSE 113* 182*  --  177*  BUN 14 13  --  15  CREATININE 0.91 0.97  --  0.90  CALCIUM 8.8* 8.7*  --   --   MG  --   --  1.8  --    GFR: CrCl cannot be calculated (Unknown ideal weight.). Liver Function Tests: No results for input(s): "AST", "ALT", "ALKPHOS", "BILITOT", "PROT", "ALBUMIN" in the last 168 hours. No results for input(s): "LIPASE", "AMYLASE" in the last 168 hours. No results for input(s): "AMMONIA" in the last 168 hours. Coagulation Profile: No results for input(s): "INR", "PROTIME" in the last 168 hours. Cardiac Enzymes: No results for input(s): "CKTOTAL", "CKMB", "CKMBINDEX", "TROPONINI" in the last 168 hours. BNP (last 3 results) No results for input(s): "PROBNP" in the last 8760 hours. HbA1C: No results for input(s): "HGBA1C" in the last 72 hours. CBG: No results for input(s): "GLUCAP" in the last 168 hours. Lipid Profile: No results for input(s): "CHOL", "HDL",  "LDLCALC", "TRIG", "CHOLHDL", "LDLDIRECT" in the last 72 hours. Thyroid Function Tests: No results for input(s): "TSH", "T4TOTAL", "FREET4", "T3FREE", "THYROIDAB" in the last 72 hours. Anemia Panel: No results for input(s): "VITAMINB12", "FOLATE", "FERRITIN", "TIBC", "IRON", "RETICCTPCT" in the last 72 hours. Urine analysis:    Component Value Date/Time   COLORURINE AMBER (A) 09/21/2021 1725   APPEARANCEUR HAZY (A) 09/21/2021 1725   LABSPEC 1.032 (H) 09/21/2021 1725   PHURINE 5.0 09/21/2021 1725   GLUCOSEU NEGATIVE 09/21/2021 1725   GLUCOSEU NEGATIVE 12/29/2016 1558   HGBUR NEGATIVE 09/21/2021 1725   BILIRUBINUR NEGATIVE 09/21/2021 1725   BILIRUBINUR Positive 08/17/2019 1456   KETONESUR 20 (A) 09/21/2021 1725   PROTEINUR 100 (A) 09/21/2021 1725   UROBILINOGEN 0.2 08/17/2019 1456   UROBILINOGEN 0.2 12/29/2016 1558   NITRITE NEGATIVE 09/21/2021 1725   LEUKOCYTESUR NEGATIVE 09/21/2021 1725   Sepsis Labs: '@LABRCNTIP'$ (procalcitonin:4,lacticidven:4) ) Recent Results (from the past 240 hour(s))  SARS Coronavirus 2 by RT PCR (hospital order, performed in [facility] hospital lab) *cepheid single result test* Anterior Nasal Swab     Status: None   Collection Time: 09/21/21  2:39 PM   Specimen: Anterior Nasal Swab  Result Value Ref Range Status   SARS Coronavirus 2 by RT PCR NEGATIVE NEGATIVE Final    Comment: (NOTE) SARS-CoV-2 target nucleic acids are NOT DETECTED.  The SARS-CoV-2 RNA is generally detectable in upper and lower respiratory specimens during the acute phase of infection. The lowest concentration of SARS-CoV-2 viral copies this assay can detect is 250 copies / mL. A negative result does not preclude SARS-CoV-2 infection and should not be used as the sole basis for treatment or other patient management decisions.  A negative result may occur with improper specimen collection / handling, submission of specimen other than nasopharyngeal swab, presence of viral mutation(s)  within the areas targeted by this assay, and inadequate number of viral copies (<250 copies / mL). A negative result must be combined with clinical observations, patient history, and epidemiological information.  Fact Sheet for Patients:   https://www.patel.info/  Fact Sheet for Healthcare Providers: https://hall.com/  This test is not  yet approved or  cleared by the [place] and has been authorized for detection and/or diagnosis of SARS-CoV-2 by FDA under an Emergency Use Authorization (EUA).  This EUA will remain in effect (meaning this test can be used) for the duration of the COVID-19 declaration under Section 564(b)(1) of the Act, 21 U.S.C. section 360bbb-3(b)(1), unless the authorization is terminated or revoked sooner.  Performed at [facility], [place] [address], [place], [place] [address]      Radiological Exams on Admission: DG Chest Port 1 View  Result Date: 09/21/2021 CLINICAL DATA:  Chest pain EXAM: PORTABLE CHEST 1 VIEW COMPARISON:  09/21/2021 FINDINGS: Cardiomegaly, vascular congestion. Interstitial prominence may reflect interstitial edema. No effusions or acute bony abnormality. Prior CABG.  Left pacer remains in place, unchanged. IMPRESSION: Cardiomegaly with vascular congestion. Possible early interstitial edema. Electronically Signed   By: Rolm Baptise M.D.   On: 09/21/2021 20:58   CT Head Wo Contrast  Result Date: 09/21/2021 CLINICAL DATA:  Headache, trauma EXAM: CT HEAD WITHOUT CONTRAST CT CERVICAL SPINE WITHOUT CONTRAST TECHNIQUE: Multidetector CT imaging of the head and cervical spine was performed following the standard protocol without intravenous contrast. Multiplanar CT image reconstructions of the cervical spine were also generated. RADIATION DOSE REDUCTION: This exam was performed according to the departmental dose-optimization program which includes automated exposure control, adjustment of the mA  and/or kV according to patient size and/or use of iterative reconstruction technique. COMPARISON:  CT head and cervical spine 09/01/2020 FINDINGS: CT HEAD FINDINGS Brain: There is no evidence of acute intracranial hemorrhage, extra-axial fluid collection, or acute infarct. There is mild-to-moderate global parenchymal volume loss with prominence of the ventricular system and extra-axial CSF spaces, not significantly changed since the prior study from 2022. Patchy and confluent hypodensity throughout the subcortical and periventricular white matter likely reflects sequela of advanced chronic white matter microangiopathy. A probable small calcified meningioma overlying the right frontal lobe is unchanged. There is no other solid mass lesion. There is no mass effect or midline shift. Vascular: There is calcification of the bilateral cavernous ICAs. Skull: Normal. Negative for fracture or focal lesion. Sinuses/Orbits: The imaged paranasal sinuses are clear. Bilateral lens implants are in place. The globes and orbits are otherwise unremarkable. Other: None. CT CERVICAL SPINE FINDINGS Alignment: There is slight reversal of the normal cervical lordosis centered at C5, unchanged. There is no antero or retrolisthesis. There is no jumped or perched facets or other evidence of traumatic malalignment. Skull base and vertebrae: Skull base alignment is maintained. Vertebral body heights are preserved. There is no evidence of acute fracture. Ankylosis of the facet joints at C2 through C4 is unchanged. Soft tissues and spinal canal: No prevertebral fluid or swelling. No visible canal hematoma. Disc levels: Multilevel disc space narrowing and degenerative endplate changes again seen, most advanced at C4-C5 and C5-C6. Multilevel facet arthropathy is also similar to the prior study. There is no evidence of high-grade spinal canal stenosis. Upper chest: The imaged lung apices are clear. Other: None. IMPRESSION: 1. No acute intracranial  pathology. 2. No acute fracture or traumatic malalignment of the cervical spine. Electronically Signed   By: [name] M.D.   On: 09/21/2021 14:14   CT Cervical Spine Wo Contrast  Result Date: 09/21/2021 CLINICAL DATA:  Headache, trauma EXAM: CT HEAD WITHOUT CONTRAST CT CERVICAL SPINE WITHOUT CONTRAST TECHNIQUE: Multidetector CT imaging of the head and cervical spine was performed following the standard protocol without intravenous contrast. Multiplanar CT image reconstructions of  the cervical spine were also generated. RADIATION DOSE REDUCTION: This exam was performed according to the departmental dose-optimization program which includes automated exposure control, adjustment of the mA and/or kV according to patient size and/or use of iterative reconstruction technique. COMPARISON:  CT head and cervical spine 09/01/2020 FINDINGS: CT HEAD FINDINGS Brain: There is no evidence of acute intracranial hemorrhage, extra-axial fluid collection, or acute infarct. There is mild-to-moderate global parenchymal volume loss with prominence of the ventricular system and extra-axial CSF spaces, not significantly changed since the prior study from 2022. Patchy and confluent hypodensity throughout the subcortical and periventricular white matter likely reflects sequela of advanced chronic white matter microangiopathy. A probable small calcified meningioma overlying the right frontal lobe is unchanged. There is no other solid mass lesion. There is no mass effect or midline shift. Vascular: There is calcification of the bilateral cavernous ICAs. Skull: Normal. Negative for fracture or focal lesion. Sinuses/Orbits: The imaged paranasal sinuses are clear. Bilateral lens implants are in place. The globes and orbits are otherwise unremarkable. Other: None. CT CERVICAL SPINE FINDINGS Alignment: There is slight reversal of the normal cervical lordosis centered at C5, unchanged. There is no antero or retrolisthesis. There is no jumped  or perched facets or other evidence of traumatic malalignment. Skull base and vertebrae: Skull base alignment is maintained. Vertebral body heights are preserved. There is no evidence of acute fracture. Ankylosis of the facet joints at C2 through C4 is unchanged. Soft tissues and spinal canal: No prevertebral fluid or swelling. No visible canal hematoma. Disc levels: Multilevel disc space narrowing and degenerative endplate changes again seen, most advanced at C4-C5 and C5-C6. Multilevel facet arthropathy is also similar to the prior study. There is no evidence of high-grade spinal canal stenosis. Upper chest: The imaged lung apices are clear. Other: None. IMPRESSION: 1. No acute intracranial pathology. 2. No acute fracture or traumatic malalignment of the cervical spine. Electronically Signed   By: [name] M.D.   On: 09/21/2021 14:14   DG Shoulder Left  Result Date: 09/21/2021 CLINICAL DATA:  Status post fall, shoulder pain EXAM: LEFT SHOULDER - 2+ VIEW COMPARISON:  None Available. FINDINGS: There is no evidence of fracture or dislocation. There is no evidence of arthropathy or other focal bone abnormality. Soft tissues are unremarkable. IMPRESSION: Negative. Electronically Signed   By: Kathreen Devoid M.D.   On: 09/21/2021 13:51   DG Chest 1 View  Result Date: 09/21/2021 CLINICAL DATA:  Status post fall.  History of AFib. EXAM: CHEST  1 VIEW COMPARISON:  09/01/2020 FINDINGS: Bilateral mild interstitial thickening. No focal consolidation. No pleural effusion or pneumothorax. Enlarged central pulmonary vasculature. Stable cardiomegaly. Prior CABG. Dual lead cardiac pacemaker. No acute osseous abnormality. IMPRESSION: 1. Cardiomegaly with mild pulmonary vascular congestion. Electronically Signed   By: Kathreen Devoid M.D.   On: 09/21/2021 13:50   DG Pelvis 1-2 Views  Result Date: 09/21/2021 CLINICAL DATA:  Status post fall. EXAM: PELVIS - 1-2 VIEW COMPARISON:  None Available. FINDINGS: Generalized  osteopenia. No acute fracture or dislocation. Right hip arthroplasty. No aggressive osseous lesion. Normal alignment. Soft tissue are unremarkable. No radiopaque foreign body or soft tissue emphysema. IMPRESSION: 1. No acute osseous injury of the pelvis. Electronically Signed   By: Kathreen Devoid M.D.   On: 09/21/2021 13:49    EKG: Independently reviewed.  Sinus rhythm.  Monitor shows atrial flutter.  Assessment/Plan Principal Problem:   V tach ([place]) Active Problems:   Essential hypertension   Ischemic cardiomyopathy  EF  30% cath 4/14 with stent   Paroxysmal atrial fibrillation (HCC)   CAD (coronary artery disease)   DNR (do not resuscitate)    Syncope likely from V. tach.  Appreciate cardiology consult.  Started on amiodarone infusion planning to transition to p.o.  Neurology also has been consulted appreciate neurology recommendation plan to get EEG done.  Discontinue tramadol. Hypokalemia replace and recheck.  Check magnesium levels. History of CAD status post stenting and CHF.  Presently n.p.o. since patient appears sedated.  Restart home medications once patient is more alert awake.  Takes lisinopril.  Restart once patient can take orally once patient is more alert awake. Abnormal UA -UA showing a lot of RBCs and bacteria.  Nitrites and leukocyte esterase are negative.  Was started on ceftriaxone in the ER follow cultures. History of orthostatic hypotension usually takes pyridostigmine. Paroxysmal atrial fibrillation Eliquis has been discontinued due to risk of fall and bleeding.  Presently on amiodarone infusion. Dementia usually takes Namenda.  At the time of my exam patient was sedated after receiving Ativan so I have not started patient's oral medications and diet.  Will need to start once patient is more alert awake.   DVT prophylaxis: Lovenox. Code Status: DNR confirmed with family at the bedside. Family Communication: Patient's daughter and son-in-law. Disposition Plan: Back to  history when stable. Consults called: Cardiology and neurology. Admission status: Observation.   Rise Patience MD [facility] Pager [phone].  If 7PM-7AM, please contact night-coverage [email or web address] Password [facility]  09/21/2021, 9:31 PM

## 2021-09-21 NOTE — ED Notes (Signed)
Got patient on there monitor patient is resting with call bell in reach

## 2021-09-22 DIAGNOSIS — I951 Orthostatic hypotension: Secondary | ICD-10-CM | POA: Diagnosis not present

## 2021-09-22 DIAGNOSIS — R569 Unspecified convulsions: Secondary | ICD-10-CM | POA: Diagnosis not present

## 2021-09-22 DIAGNOSIS — F0390 Unspecified dementia without behavioral disturbance: Secondary | ICD-10-CM | POA: Diagnosis not present

## 2021-09-22 DIAGNOSIS — R55 Syncope and collapse: Secondary | ICD-10-CM

## 2021-09-22 DIAGNOSIS — I472 Ventricular tachycardia, unspecified: Secondary | ICD-10-CM | POA: Diagnosis not present

## 2021-09-22 LAB — CBC WITH DIFFERENTIAL/PLATELET
Abs Immature Granulocytes: 0.03 10*3/uL (ref 0.00–0.07)
Basophils Absolute: 0 10*3/uL (ref 0.0–0.1)
Basophils Relative: 0 %
Eosinophils Absolute: 0.1 10*3/uL (ref 0.0–0.5)
Eosinophils Relative: 1 %
HCT: 37 % (ref 36.0–46.0)
Hemoglobin: 11.7 g/dL — ABNORMAL LOW (ref 12.0–15.0)
Immature Granulocytes: 0 %
Lymphocytes Relative: 10 %
Lymphs Abs: 0.9 10*3/uL (ref 0.7–4.0)
MCH: 30.8 pg (ref 26.0–34.0)
MCHC: 31.6 g/dL (ref 30.0–36.0)
MCV: 97.4 fL (ref 80.0–100.0)
Monocytes Absolute: 0.7 10*3/uL (ref 0.1–1.0)
Monocytes Relative: 7 %
Neutro Abs: 7.8 10*3/uL — ABNORMAL HIGH (ref 1.7–7.7)
Neutrophils Relative %: 82 %
Platelets: 112 10*3/uL — ABNORMAL LOW (ref 150–400)
RBC: 3.8 MIL/uL — ABNORMAL LOW (ref 3.87–5.11)
RDW: 15.1 % (ref 11.5–15.5)
WBC: 9.6 10*3/uL (ref 4.0–10.5)
nRBC: 0 % (ref 0.0–0.2)

## 2021-09-22 LAB — BASIC METABOLIC PANEL
Anion gap: 9 (ref 5–15)
BUN: 11 mg/dL (ref 8–23)
CO2: 25 mmol/L (ref 22–32)
Calcium: 8.3 mg/dL — ABNORMAL LOW (ref 8.9–10.3)
Chloride: 106 mmol/L (ref 98–111)
Creatinine, Ser: 0.84 mg/dL (ref 0.44–1.00)
GFR, Estimated: >60 mL/min (ref >60)
Glucose, Bld: 118 mg/dL — ABNORMAL HIGH (ref 70–99)
Potassium: 4 mmol/L (ref 3.5–5.1)
Sodium: 140 mmol/L (ref 135–145)

## 2021-09-22 LAB — GLUCOSE, CAPILLARY: Glucose-Capillary: 140 mg/dL — ABNORMAL HIGH (ref 70–99)

## 2021-09-22 LAB — CBG MONITORING, ED
Glucose-Capillary: 117 mg/dL — ABNORMAL HIGH (ref 70–99)
Glucose-Capillary: 120 mg/dL — ABNORMAL HIGH (ref 70–99)
Glucose-Capillary: 141 mg/dL — ABNORMAL HIGH (ref 70–99)

## 2021-09-22 LAB — MAGNESIUM: Magnesium: 1.7 mg/dL (ref 1.7–2.4)

## 2021-09-22 LAB — CREATININE, SERUM
Creatinine, Ser: 0.89 mg/dL (ref 0.44–1.00)
GFR, Estimated: >60 mL/min (ref >60)

## 2021-09-22 LAB — TROPONIN I (HIGH SENSITIVITY): Troponin I (High Sensitivity): 40 ng/L — ABNORMAL HIGH (ref <18)

## 2021-09-22 MED ORDER — SODIUM CHLORIDE 0.9 % IV SOLN: 1.0000 g | INTRAVENOUS | Status: DC

## 2021-09-22 MED ORDER — MELATONIN 5 MG PO TABS
5.0000 mg | ORAL_TABLET | Freq: Every day | ORAL | Status: DC
Start: 2021-09-22 — End: 2021-09-29
  Administered 2021-09-22 – 2021-09-28 (×7): 5 mg via ORAL
  Filled 2021-09-22 (×7): qty 1

## 2021-09-22 MED ORDER — FUROSEMIDE 10 MG/ML IJ SOLN
40.0000 mg | Freq: Once | INTRAMUSCULAR | Status: AC
  Administered 2021-09-22: 40 mg via INTRAVENOUS
  Filled 2021-09-22: qty 4

## 2021-09-22 MED ORDER — HYDRALAZINE HCL 20 MG/ML IJ SOLN
5.0000 mg | INTRAMUSCULAR | Status: DC | PRN
Start: 2021-09-22 — End: 2021-09-29
  Administered 2021-09-22 – 2021-09-25 (×2): 5 mg via INTRAVENOUS
  Filled 2021-09-22 (×2): qty 1

## 2021-09-22 MED ORDER — MAGNESIUM SULFATE 2 GM/50ML IV SOLN
2.0000 g | Freq: Once | INTRAVENOUS | Status: AC
Start: 2021-09-22 — End: 2021-09-22
  Administered 2021-09-22: 2 g via INTRAVENOUS
  Filled 2021-09-22: qty 50

## 2021-09-22 MED ORDER — AMIODARONE HCL 200 MG PO TABS: 400.0000 mg | ORAL_TABLET | Freq: Every day | ORAL | Status: DC

## 2021-09-22 MED ORDER — MEMANTINE HCL 5 MG PO TABS
5.0000 mg | ORAL_TABLET | Freq: Two times a day (BID) | ORAL | Status: DC
  Administered 2021-09-22 – 2021-09-29 (×15): 5 mg via ORAL
  Filled 2021-09-22 (×16): qty 1

## 2021-09-22 MED ORDER — PYRIDOSTIGMINE BROMIDE 60 MG PO TABS
60.0000 mg | ORAL_TABLET | Freq: Every day | ORAL | Status: DC
Start: 2021-09-22 — End: 2021-09-29
  Administered 2021-09-22 – 2021-09-29 (×8): 60 mg via ORAL
  Filled 2021-09-22 (×8): qty 1

## 2021-09-22 MED ORDER — OLANZAPINE 5 MG PO TABS
5.0000 mg | ORAL_TABLET | Freq: Every day | ORAL | Status: DC
  Administered 2021-09-22: 5 mg via ORAL
  Filled 2021-09-22 (×2): qty 1

## 2021-09-22 MED ORDER — ESCITALOPRAM OXALATE 10 MG PO TABS
20.0000 mg | ORAL_TABLET | Freq: Every day | ORAL | Status: DC
  Administered 2021-09-22 – 2021-09-23 (×2): 20 mg via ORAL
  Filled 2021-09-22 (×2): qty 2

## 2021-09-22 MED ORDER — QUETIAPINE FUMARATE 25 MG PO TABS: 25.0000 mg | ORAL_TABLET | Freq: Every day | ORAL | Status: DC

## 2021-09-22 MED ORDER — AMIODARONE HCL 200 MG PO TABS
400.0000 mg | ORAL_TABLET | Freq: Two times a day (BID) | ORAL | Status: DC
  Administered 2021-09-22 – 2021-09-23 (×3): 400 mg via ORAL
  Filled 2021-09-22 (×3): qty 2

## 2021-09-22 MED ORDER — SODIUM CHLORIDE 0.9 % IV SOLN
1.0000 g | Freq: Once | INTRAVENOUS | Status: AC
  Administered 2021-09-22: 1 g via INTRAVENOUS
  Filled 2021-09-22: qty 10

## 2021-09-22 NOTE — Progress Notes (Signed)
[facility]) Hospital Liaison note:  Notified by Dorthy Cooler, PA-C of request for [facility] Palliative Care services. Will continue to follow for disposition.  Please call with any outpatient palliative questions or concerns.  Thank you for the opportunity to participate in this patient's care.  Thank you, Lorelee Market, LPN [facility] Liaison [phone]

## 2021-09-22 NOTE — Procedures (Signed)
Patient Name: [name] [name]  MRN: [id number]  Epilepsy Attending: Lora Havens  Referring Physician/Provider: Kerney Elbe, MD  Date: 09/21/2021 Duration: 21.11 mins  Patient history: 88 year old female with a history of dementia who presented twice to the ED today, initially for evaluation after an unwitnessed fall versus syncope, the second visit after seizure-like activity was witnessed by family while on the way home following the first evaluation. EEG to evaluate for seizure  Level of alertness: Awake, asleep  AEDs during EEG study: None  Technical aspects: This EEG study was done with scalp electrodes positioned according to the 10-20 International system of electrode placement. Electrical activity was acquired at a sampling rate of '500Hz'$  and reviewed with a high frequency filter of '70Hz'$  and a low frequency filter of '1Hz'$ . EEG data were recorded continuously and digitally stored.   Description: The posterior dominant rhythm consists of 7 Hz activity of moderate voltage (25-35 uV) seen predominantly in posterior head regions, symmetric and reactive to eye opening and eye closing. Sleep was characterized by vertex waves, sleep spindles (12 to 14 Hz), maximal frontocentral region.  EEG showed continuous generalized 3 to 6 Hz theta-delta slowing. Hyperventilation and photic stimulation were not performed.     ABNORMALITY - Continuous slow, generalized  IMPRESSION: This study is suggestive of mild to moderate diffuse encephalopathy, nonspecific etiology. No seizures or epileptiform discharges were seen throughout the recording.  Luxe Cuadros Barbra Sarks

## 2021-09-22 NOTE — Progress Notes (Signed)
   09/22/21 1423  Assess: MEWS Score  Temp 99.4 F (37.4 C)  BP (!) 143/59  MAP (mmHg) 84  Pulse Rate 70  ECG Heart Rate 70  Resp (!) 26  Level of Consciousness Alert  SpO2 (!) 88 %  O2 Device Nasal Cannula  O2 Flow Rate (L/min) 2 L/min  Assess: MEWS Score  MEWS Temp 0  MEWS Systolic 0  MEWS Pulse 0  MEWS RR 2  MEWS LOC 0  MEWS Score 2  MEWS Score Color Yellow  Assess: if the MEWS score is Yellow or Red  Were vital signs taken at a resting state? Yes  Focused Assessment No change from prior assessment  Does the patient meet 2 or more of the SIRS criteria? No  MEWS guidelines implemented *See Row Information* Yes  Treat  MEWS Interventions Escalated (See documentation below)  Pain Scale PAINAD  Pain Intervention(s) Repositioned;Emotional support;Relaxation  Breathing 0  Negative Vocalization 0  Facial Expression 1  Body Language 1  Consolability 0  PAINAD Score 2  Take Vital Signs  Increase Vital Sign Frequency  Yellow: Q 2hr X 2 then Q 4hr X 2, if remains yellow, continue Q 4hrs  Escalate  MEWS: Escalate Yellow: discuss with charge nurse/RN and consider discussing with provider and RRT  Notify: Charge Nurse/RN  Name of Charge Nurse/RN Notified Nona Dell, RN  Date Charge Nurse/RN Notified 09/22/21  Time Charge Nurse/RN Notified 1439  Document  Patient Outcome  (stable in room)  Progress note created (see row info) Yes  Assess: SIRS CRITERIA  SIRS Temperature  0  SIRS Pulse 0  SIRS Respirations  1  SIRS WBC 0  SIRS Score Sum  1

## 2021-09-22 NOTE — Progress Notes (Addendum)
Neurology Progress Note [name] [name] MR# [id number] 09/22/2021   S: No new events or new complaints.   O: Current vital signs: BP (!) 170/81   Pulse 71   Temp 98.7 F (37.1 C) (Oral)   Resp (!) 21   LMP  (LMP Unknown)   SpO2 100%  Vital signs in last 24 hours: Temp:  [98.1 F (36.7 C)-98.7 F (37.1 C)] 98.7 F (37.1 C) (06/12 1917) Pulse Rate:  [68-97] 71 (06/13 0733) Resp:  [14-28] 21 (06/13 0733) BP: (141-194)/(67-124) 170/81 (06/13 0731) SpO2:  [89 %-100 %] 100 % (06/13 0733) GENERAL: sleeping arouses to gentle stimulation in NAD HEENT: Normocephalic and atraumatic, moist mm, no LN++, no thyromegaly LUNGS: symmetric excursions bilaterally with no audible wheezes. CV: RR, equal pulses bilaterally. ABDOMEN: Soft, nontender, nondistended with normoactive BS Ext: warm, well perfused, intact peripheral pulses NEURO:  Mental Status: AA&Ox1 Language: speech is fluent with intact repetition, and comprehension. PERR. EOMI, visual fields full, no facial asymmetry, facial sensation intact, hearing decreased. No evidence of tongue atrophy or fibrillations, tongue/uvula/soft palate midline elevates symmetrically  Normal sternocleidomastoid and trapezius muscle strength. Motor: strength in all extremities. Tone: Tone and bulk is normal Sensation: Intact to light touch bilaterally Coordination: Unable to follow commands. Gait - Deferred  Labs K 3.1 Ca (ionized) 0.98  EEG 09/21/2021 reviewed which showed continuous generalized slowing suggesting a mild, etiologically non-specific encephalopathy.  Imaging I have reviewed images in epic and the results pertinent to this consultation are: CT Head showed no evidence of acute intracranial hemorrhage,  Assessment: [name] [name] is a 88 y.o. female PMHx dementia, orthostatic hypotension with a history of dementia initially with unwitnessed fall versus syncope, then later with seizure-like activity witnessed by family. EEG did not show  epileptogenicity. Labs showed hypocalcemia and hypokalemia and though cannot rule out provoked seizure in setting of metabolic derangement there is not enough objective evidence to start antiseizure medication especially as ASMs will further impair cognition.   Impression Convulsive syncope (Hx orthostatic hypotension) vs. Provoked seizure in setting of hypocalcemia.   Recommendations: - Continue to correct metabolic derangements. - Consider improving hydration and compression socks. - Follow up with PCP. - Neurology will remain available, please call for questions.  Electronically signed by:  Lynnae Sandhoff, MD Page: [phone] 09/22/2021, 8:30 AM  If 7pm- 7am, please page neurology on call as listed in [place].

## 2021-09-22 NOTE — Progress Notes (Signed)
Progress Note  Patient Name: [name] [name] Date of Encounter: 09/22/2021  Primary Cardiologist: Virl Axe, MD   Subjective   Overnight EEG was performed, not suggestive of seizures Patient says no and thank you.  Does not answer to symptoms.  Inpatient Medications    Scheduled Meds:  amiodarone  150 mg Intravenous Once   enoxaparin (LOVENOX) injection  40 mg Subcutaneous Q24H   Continuous Infusions:  amiodarone 30 mg/hr (09/22/21 0235)   cefTRIAXone (ROCEPHIN)  IV     PRN Meds: acetaminophen **OR** acetaminophen, hydrALAZINE   Vital Signs    Vitals:   09/22/21 0731 09/22/21 0733 09/22/21 0855 09/22/21 0900  BP: (!) 170/81   (!) 153/88  Pulse: 70 71 70 70  Resp: (!) 24 (!) 21 20 (!) 23  Temp:      TempSrc:      SpO2: 100% 100% 97% 96%    Intake/Output Summary (Last 24 hours) at 09/22/2021 0926 Last data filed at 09/22/2021 0305 Gross per 24 hour  Intake 200 ml  Output --  Net 200 ml   There were no vitals filed for this visit.  Telemetry    Post her 1942 [place]; have had bigeminal PVCs then SR - Personally Reviewed  Physical Exam   Gen: no distress, Neck: JVD to mid neck at 30 degrees Cardiac: No Rubs or Gallops, systolic murmur, RRR +2 radial pulses Respiratory: Decreased breath sounds bilaterall GI: Soft, nontender, non-distended  MS: Non pitting edema;  moves all extremities Integument: Skin feels warm Neuropsych:  Not conversant   Labs    Chemistry Recent Labs  Lab 09/21/21 1432 09/21/21 1931 09/21/21 2001 09/21/21 2324 09/22/21 0441  NA 144 137 141  --  140  K 3.5 3.1* 3.1*  --  4.0  CL 107 101 104  --  106  CO2 27 23  --   --  25  GLUCOSE 113* 182* 177*  --  118*  BUN '14 13 15  '$ --  11  CREATININE 0.91 0.97 0.90 0.89 0.84  CALCIUM 8.8* 8.7*  --   --  8.3*  GFRNONAA >60 57*  --  >60 >60  ANIONGAP 10 13  --   --  9     Hematology Recent Labs  Lab 09/21/21 1432 09/21/21 1931 09/21/21 2001 09/22/21 0441  WBC 7.4 10.7*  --   9.6  RBC 3.66* 4.06  --  3.80*  HGB 11.3* 12.2 13.6 11.7*  HCT 35.8* 40.2 40.0 37.0  MCV 97.8 99.0  --  97.4  MCH 30.9 30.0  --  30.8  MCHC 31.6 30.3  --  31.6  RDW 15.1 15.1  --  15.1  PLT 119* 128*  --  112*    Cardiac EnzymesNo results for input(s): "TROPONINI" in the last 168 hours. No results for input(s): "TROPIPOC" in the last 168 hours.   BNP Recent Labs  Lab 09/21/21 1432  BNP 862.4*     DDimer No results for input(s): "DDIMER" in the last 168 hours.   Radiology    EEG adult  Result Date: 09/22/2021 Lora Havens, MD     09/22/2021  8:18 AM Patient Name: [name] [name] MRN: [id number] Epilepsy Attending: Lora Havens Referring Physician/Provider: Kerney Elbe, MD Date: 09/21/2021 Duration: 21.11 mins Patient history: 88 year old female with a history of dementia who presented twice to the ED today, initially for evaluation after an unwitnessed fall versus syncope, the second visit after seizure-like activity was witnessed by  family while on the way home following the first evaluation. EEG to evaluate for seizure Level of alertness: Awake, asleep AEDs during EEG study: None Technical aspects: This EEG study was done with scalp electrodes positioned according to the 10-20 International system of electrode placement. Electrical activity was acquired at a sampling rate of '500Hz'$  and reviewed with a high frequency filter of '70Hz'$  and a low frequency filter of '1Hz'$ . EEG data were recorded continuously and digitally stored. Description: The posterior dominant rhythm consists of 7 Hz activity of moderate voltage (25-35 uV) seen predominantly in posterior head regions, symmetric and reactive to eye opening and eye closing. Sleep was characterized by vertex waves, sleep spindles (12 to 14 Hz), maximal frontocentral region.  EEG showed continuous generalized 3 to 6 Hz theta-delta slowing. Hyperventilation and photic stimulation were not performed.   ABNORMALITY - Continuous slow,  generalized IMPRESSION: This study is suggestive of mild to moderate diffuse encephalopathy, nonspecific etiology. No seizures or epileptiform discharges were seen throughout the recording. Lora Havens   DG Chest Port 1 View  Result Date: 09/21/2021 CLINICAL DATA:  Chest pain EXAM: PORTABLE CHEST 1 VIEW COMPARISON:  09/21/2021 FINDINGS: Cardiomegaly, vascular congestion. Interstitial prominence may reflect interstitial edema. No effusions or acute bony abnormality. Prior CABG.  Left pacer remains in place, unchanged. IMPRESSION: Cardiomegaly with vascular congestion. Possible early interstitial edema. Electronically Signed   By: Rolm Baptise M.D.   On: 09/21/2021 20:58   CT Head Wo Contrast  Result Date: 09/21/2021 CLINICAL DATA:  Headache, trauma EXAM: CT HEAD WITHOUT CONTRAST CT CERVICAL SPINE WITHOUT CONTRAST TECHNIQUE: Multidetector CT imaging of the head and cervical spine was performed following the standard protocol without intravenous contrast. Multiplanar CT image reconstructions of the cervical spine were also generated. RADIATION DOSE REDUCTION: This exam was performed according to the departmental dose-optimization program which includes automated exposure control, adjustment of the mA and/or kV according to patient size and/or use of iterative reconstruction technique. COMPARISON:  CT head and cervical spine 09/01/2020 FINDINGS: CT HEAD FINDINGS Brain: There is no evidence of acute intracranial hemorrhage, extra-axial fluid collection, or acute infarct. There is mild-to-moderate global parenchymal volume loss with prominence of the ventricular system and extra-axial CSF spaces, not significantly changed since the prior study from 2022. Patchy and confluent hypodensity throughout the subcortical and periventricular white matter likely reflects sequela of advanced chronic white matter microangiopathy. A probable small calcified meningioma overlying the right frontal lobe is unchanged. There  is no other solid mass lesion. There is no mass effect or midline shift. Vascular: There is calcification of the bilateral cavernous ICAs. Skull: Normal. Negative for fracture or focal lesion. Sinuses/Orbits: The imaged paranasal sinuses are clear. Bilateral lens implants are in place. The globes and orbits are otherwise unremarkable. Other: None. CT CERVICAL SPINE FINDINGS Alignment: There is slight reversal of the normal cervical lordosis centered at C5, unchanged. There is no antero or retrolisthesis. There is no jumped or perched facets or other evidence of traumatic malalignment. Skull base and vertebrae: Skull base alignment is maintained. Vertebral body heights are preserved. There is no evidence of acute fracture. Ankylosis of the facet joints at C2 through C4 is unchanged. Soft tissues and spinal canal: No prevertebral fluid or swelling. No visible canal hematoma. Disc levels: Multilevel disc space narrowing and degenerative endplate changes again seen, most advanced at C4-C5 and C5-C6. Multilevel facet arthropathy is also similar to the prior study. There is no evidence of high-grade spinal canal stenosis. Upper chest: The  imaged lung apices are clear. Other: None. IMPRESSION: 1. No acute intracranial pathology. 2. No acute fracture or traumatic malalignment of the cervical spine. Electronically Signed   By: [name] M.D.   On: 09/21/2021 14:14   CT Cervical Spine Wo Contrast  Result Date: 09/21/2021 CLINICAL DATA:  Headache, trauma EXAM: CT HEAD WITHOUT CONTRAST CT CERVICAL SPINE WITHOUT CONTRAST TECHNIQUE: Multidetector CT imaging of the head and cervical spine was performed following the standard protocol without intravenous contrast. Multiplanar CT image reconstructions of the cervical spine were also generated. RADIATION DOSE REDUCTION: This exam was performed according to the departmental dose-optimization program which includes automated exposure control, adjustment of the mA and/or kV  according to patient size and/or use of iterative reconstruction technique. COMPARISON:  CT head and cervical spine 09/01/2020 FINDINGS: CT HEAD FINDINGS Brain: There is no evidence of acute intracranial hemorrhage, extra-axial fluid collection, or acute infarct. There is mild-to-moderate global parenchymal volume loss with prominence of the ventricular system and extra-axial CSF spaces, not significantly changed since the prior study from 2022. Patchy and confluent hypodensity throughout the subcortical and periventricular white matter likely reflects sequela of advanced chronic white matter microangiopathy. A probable small calcified meningioma overlying the right frontal lobe is unchanged. There is no other solid mass lesion. There is no mass effect or midline shift. Vascular: There is calcification of the bilateral cavernous ICAs. Skull: Normal. Negative for fracture or focal lesion. Sinuses/Orbits: The imaged paranasal sinuses are clear. Bilateral lens implants are in place. The globes and orbits are otherwise unremarkable. Other: None. CT CERVICAL SPINE FINDINGS Alignment: There is slight reversal of the normal cervical lordosis centered at C5, unchanged. There is no antero or retrolisthesis. There is no jumped or perched facets or other evidence of traumatic malalignment. Skull base and vertebrae: Skull base alignment is maintained. Vertebral body heights are preserved. There is no evidence of acute fracture. Ankylosis of the facet joints at C2 through C4 is unchanged. Soft tissues and spinal canal: No prevertebral fluid or swelling. No visible canal hematoma. Disc levels: Multilevel disc space narrowing and degenerative endplate changes again seen, most advanced at C4-C5 and C5-C6. Multilevel facet arthropathy is also similar to the prior study. There is no evidence of high-grade spinal canal stenosis. Upper chest: The imaged lung apices are clear. Other: None. IMPRESSION: 1. No acute intracranial pathology.  2. No acute fracture or traumatic malalignment of the cervical spine. Electronically Signed   By: [name] M.D.   On: 09/21/2021 14:14   DG Shoulder Left  Result Date: 09/21/2021 CLINICAL DATA:  Status post fall, shoulder pain EXAM: LEFT SHOULDER - 2+ VIEW COMPARISON:  None Available. FINDINGS: There is no evidence of fracture or dislocation. There is no evidence of arthropathy or other focal bone abnormality. Soft tissues are unremarkable. IMPRESSION: Negative. Electronically Signed   By: Kathreen Devoid M.D.   On: 09/21/2021 13:51   DG Chest 1 View  Result Date: 09/21/2021 CLINICAL DATA:  Status post fall.  History of AFib. EXAM: CHEST  1 VIEW COMPARISON:  09/01/2020 FINDINGS: Bilateral mild interstitial thickening. No focal consolidation. No pleural effusion or pneumothorax. Enlarged central pulmonary vasculature. Stable cardiomegaly. Prior CABG. Dual lead cardiac pacemaker. No acute osseous abnormality. IMPRESSION: 1. Cardiomegaly with mild pulmonary vascular congestion. Electronically Signed   By: Kathreen Devoid M.D.   On: 09/21/2021 13:50   DG Pelvis 1-2 Views  Result Date: 09/21/2021 CLINICAL DATA:  Status post fall. EXAM: PELVIS - 1-2 VIEW COMPARISON:  None  Available. FINDINGS: Generalized osteopenia. No acute fracture or dislocation. Right hip arthroplasty. No aggressive osseous lesion. Normal alignment. Soft tissue are unremarkable. No radiopaque foreign body or soft tissue emphysema. IMPRESSION: 1. No acute osseous injury of the pelvis. Electronically Signed   By: Kathreen Devoid M.D.   On: 09/21/2021 13:49     Patient Profile     88 y.o. female with syncope ion the setting of severe dementia  Assessment & Plan    Syncope in the setting of Severe Dementia Hypertension With orthostatic hypotension High grade heart block s/p CRT-P (device dependent [facility]) CAD with LAD PCI and LVEF 25-30% AF with CHADSVASC 6 AC stopped in the setting of GOC - will transition to amiodarone 400 mg PO  BID for ten days than 400 mg PO daily - will offer a one time 40 IV lasix in the setting of her HF - discussed with daugther, GOC appear to be largely for symptoms burden and management; we will honor this and plan for conservative therapy - if no further ectopy, given GOC and past sundowning, may be reasonable to return to [facility] care; she is electrically quiescent today   For questions or updates, please contact [facility] Please consult [email or web address] for contact info under Cardiology/STEMI.      Rudean Haskell, MD Cardiologist Hypertrophic [address], [address] [place], [place] [address] [phone]  9:26 AM

## 2021-09-22 NOTE — ED Provider Notes (Signed)
[facility] EMERGENCY DEPARTMENT Provider Note   CSN: [id number] Arrival date & time: 09/21/21  1912     History  Chief Complaint  Patient presents with   Seizures    [name] [name] is a 88 y.o. female.  88 year old female presents after possible seizure.  Patient was seen earlier today after possible syncope vs unwitnessed fall.  Workup earlier was without significant abnormality.  Family desired DC after evaluation by hospitalist service for admission.  Daughter was driving patient home when patient had possible seizure like episode. Daughter noted leftward eye deviation and stiffening / straightening of both arms. Episode lasted less than a minute.  No prior history of seizure.  No noted post-ictal period  The history is provided by the patient, a relative and medical records.       Home Medications Prior to Admission medications   Medication Sig Start Date End Date Taking? Authorizing Provider  acetaminophen (TYLENOL) 500 MG tablet Take 1,000 mg by mouth in the morning and at bedtime.   Yes [provider]  Cholecalciferol (VITAMIN D3 SUPER STRENGTH) 50 MCG (2000 UT) TABS Take 2,000 Units by mouth daily.   Yes [provider]  ELIQUIS 5 MG TABS tablet TAKE 1 TABLET(5 MG) BY MOUTH TWICE DAILY Patient taking differently: Take 5 mg by mouth in the morning and at bedtime. 09/25/19  Yes Deboraha Sprang, MD  escitalopram (LEXAPRO) 20 MG tablet TAKE 1 TABLET(20 MG) BY MOUTH DAILY Patient taking differently: Take 20 mg by mouth in the morning. 11/26/19  Yes Marin Olp, MD  lisinopril (ZESTRIL) 5 MG tablet Take 1 tablet (5 mg total) by mouth daily. 09/10/20  Yes Shawna Clamp, MD  Melatonin 10 MG TABS Take 10 mg by mouth at bedtime.   Yes [provider]  memantine (NAMENDA) 5 MG tablet Take 5 mg by mouth in the morning and at bedtime. 08/22/20  Yes [provider]  pyridostigmine (MESTINON) 60 MG tablet TAKE 1  TABLET(60 MG) BY MOUTH DAILY Patient taking differently: Take 60 mg by mouth in the morning. 08/06/19  Yes Marin Olp, MD  QUEtiapine (SEROQUEL) 25 MG tablet Take 25 mg by mouth at bedtime.   Yes [provider]  traMADol (ULTRAM) 50 MG tablet Take 25 mg by mouth in the morning and at bedtime.   Yes [provider]  haloperidol (HALDOL) 0.5 MG tablet Take 1 tablet (0.5 mg total) by mouth 2 (two) times daily as needed for agitation (separate by at least 6 hours). Patient not taking: Reported on 09/21/2021 07/02/19   Marin Olp, MD      Allergies    Aricept [donepezil] and Nitroglycerin    Review of Systems   Review of Systems  Unable to perform ROS: Acuity of condition    Physical Exam Updated Vital Signs BP (!) 161/83   Pulse 70   Temp 98.7 F (37.1 C) (Oral)   Resp 20   LMP  (LMP Unknown)   SpO2 100%  Physical Exam Vitals and nursing note reviewed.  Constitutional:      General: She is not in acute distress.    Appearance: Normal appearance. She is well-developed.  HENT:     Head: Normocephalic and atraumatic.  Eyes:     Conjunctiva/sclera: Conjunctivae normal.     Pupils: Pupils are equal, round, and reactive to light.  Cardiovascular:     Rate and Rhythm: Normal rate and regular rhythm.  Heart sounds: Normal heart sounds.  Pulmonary:     Effort: Pulmonary effort is normal. No respiratory distress.     Breath sounds: Normal breath sounds.  Abdominal:     General: There is no distension.     Palpations: Abdomen is soft.     Tenderness: There is no abdominal tenderness.  Musculoskeletal:        General: No deformity. Normal range of motion.     Cervical back: Normal range of motion and neck supple.  Skin:    General: Skin is warm and dry.     Comments: Recently repaired laceration to left upper arm.  Neurological:     General: No focal deficit present.     Mental Status: She is alert. Mental status is at baseline.     ED Results  / Procedures / Treatments   Labs (all labs ordered are listed, but only abnormal results are displayed) Labs Reviewed  BASIC METABOLIC PANEL - Abnormal; Notable for the following components:      Result Value   Potassium 3.1 (*)    Glucose, Bld 182 (*)    Calcium 8.7 (*)    GFR, Estimated 57 (*)    All other components within normal limits  CBC WITH DIFFERENTIAL/PLATELET - Abnormal; Notable for the following components:   WBC 10.7 (*)    Platelets 128 (*)    Neutro Abs 8.1 (*)    All other components within normal limits  I-STAT CHEM 8, ED - Abnormal; Notable for the following components:   Potassium 3.1 (*)    Glucose, Bld 177 (*)    Calcium, Ion 0.98 (*)    All other components within normal limits  TROPONIN I (HIGH SENSITIVITY) - Abnormal; Notable for the following components:   Troponin I (High Sensitivity) 33 (*)    All other components within normal limits  MAGNESIUM  CREATININE, SERUM  MAGNESIUM  BASIC METABOLIC PANEL  CBC WITH DIFFERENTIAL/PLATELET  TROPONIN I (HIGH SENSITIVITY)    EKG EKG Interpretation  Date/Time:  Monday September 21 2021 19:56:53 EDT Ventricular Rate:  70 PR Interval:  210 QRS Duration: 159 QT Interval:  471 QTC Calculation: 509 R Axis:   -78 Text Interpretation: Sinus or ectopic atrial rhythm LVH with IVCD, LAD and secondary repol abnrm Prolonged QT interval Confirmed by Dene Gentry [phone]) on 09/21/2021 9:05:50 PM  Radiology DG Chest Port 1 View  Result Date: 09/21/2021 CLINICAL DATA:  Chest pain EXAM: PORTABLE CHEST 1 VIEW COMPARISON:  09/21/2021 FINDINGS: Cardiomegaly, vascular congestion. Interstitial prominence may reflect interstitial edema. No effusions or acute bony abnormality. Prior CABG.  Left pacer remains in place, unchanged. IMPRESSION: Cardiomegaly with vascular congestion. Possible early interstitial edema. Electronically Signed   By: Rolm Baptise M.D.   On: 09/21/2021 20:58   CT Head Wo Contrast  Result Date:  09/21/2021 CLINICAL DATA:  Headache, trauma EXAM: CT HEAD WITHOUT CONTRAST CT CERVICAL SPINE WITHOUT CONTRAST TECHNIQUE: Multidetector CT imaging of the head and cervical spine was performed following the standard protocol without intravenous contrast. Multiplanar CT image reconstructions of the cervical spine were also generated. RADIATION DOSE REDUCTION: This exam was performed according to the departmental dose-optimization program which includes automated exposure control, adjustment of the mA and/or kV according to patient size and/or use of iterative reconstruction technique. COMPARISON:  CT head and cervical spine 09/01/2020 FINDINGS: CT HEAD FINDINGS Brain: There is no evidence of acute intracranial hemorrhage, extra-axial fluid collection, or acute infarct. There is mild-to-moderate global parenchymal volume  loss with prominence of the ventricular system and extra-axial CSF spaces, not significantly changed since the prior study from 2022. Patchy and confluent hypodensity throughout the subcortical and periventricular white matter likely reflects sequela of advanced chronic white matter microangiopathy. A probable small calcified meningioma overlying the right frontal lobe is unchanged. There is no other solid mass lesion. There is no mass effect or midline shift. Vascular: There is calcification of the bilateral cavernous ICAs. Skull: Normal. Negative for fracture or focal lesion. Sinuses/Orbits: The imaged paranasal sinuses are clear. Bilateral lens implants are in place. The globes and orbits are otherwise unremarkable. Other: None. CT CERVICAL SPINE FINDINGS Alignment: There is slight reversal of the normal cervical lordosis centered at C5, unchanged. There is no antero or retrolisthesis. There is no jumped or perched facets or other evidence of traumatic malalignment. Skull base and vertebrae: Skull base alignment is maintained. Vertebral body heights are preserved. There is no evidence of acute  fracture. Ankylosis of the facet joints at C2 through C4 is unchanged. Soft tissues and spinal canal: No prevertebral fluid or swelling. No visible canal hematoma. Disc levels: Multilevel disc space narrowing and degenerative endplate changes again seen, most advanced at C4-C5 and C5-C6. Multilevel facet arthropathy is also similar to the prior study. There is no evidence of high-grade spinal canal stenosis. Upper chest: The imaged lung apices are clear. Other: None. IMPRESSION: 1. No acute intracranial pathology. 2. No acute fracture or traumatic malalignment of the cervical spine. Electronically Signed   By: [name] M.D.   On: 09/21/2021 14:14   CT Cervical Spine Wo Contrast  Result Date: 09/21/2021 CLINICAL DATA:  Headache, trauma EXAM: CT HEAD WITHOUT CONTRAST CT CERVICAL SPINE WITHOUT CONTRAST TECHNIQUE: Multidetector CT imaging of the head and cervical spine was performed following the standard protocol without intravenous contrast. Multiplanar CT image reconstructions of the cervical spine were also generated. RADIATION DOSE REDUCTION: This exam was performed according to the departmental dose-optimization program which includes automated exposure control, adjustment of the mA and/or kV according to patient size and/or use of iterative reconstruction technique. COMPARISON:  CT head and cervical spine 09/01/2020 FINDINGS: CT HEAD FINDINGS Brain: There is no evidence of acute intracranial hemorrhage, extra-axial fluid collection, or acute infarct. There is mild-to-moderate global parenchymal volume loss with prominence of the ventricular system and extra-axial CSF spaces, not significantly changed since the prior study from 2022. Patchy and confluent hypodensity throughout the subcortical and periventricular white matter likely reflects sequela of advanced chronic white matter microangiopathy. A probable small calcified meningioma overlying the right frontal lobe is unchanged. There is no other solid  mass lesion. There is no mass effect or midline shift. Vascular: There is calcification of the bilateral cavernous ICAs. Skull: Normal. Negative for fracture or focal lesion. Sinuses/Orbits: The imaged paranasal sinuses are clear. Bilateral lens implants are in place. The globes and orbits are otherwise unremarkable. Other: None. CT CERVICAL SPINE FINDINGS Alignment: There is slight reversal of the normal cervical lordosis centered at C5, unchanged. There is no antero or retrolisthesis. There is no jumped or perched facets or other evidence of traumatic malalignment. Skull base and vertebrae: Skull base alignment is maintained. Vertebral body heights are preserved. There is no evidence of acute fracture. Ankylosis of the facet joints at C2 through C4 is unchanged. Soft tissues and spinal canal: No prevertebral fluid or swelling. No visible canal hematoma. Disc levels: Multilevel disc space narrowing and degenerative endplate changes again seen, most advanced at C4-C5 and C5-C6. Multilevel  facet arthropathy is also similar to the prior study. There is no evidence of high-grade spinal canal stenosis. Upper chest: The imaged lung apices are clear. Other: None. IMPRESSION: 1. No acute intracranial pathology. 2. No acute fracture or traumatic malalignment of the cervical spine. Electronically Signed   By: [name] M.D.   On: 09/21/2021 14:14   DG Shoulder Left  Result Date: 09/21/2021 CLINICAL DATA:  Status post fall, shoulder pain EXAM: LEFT SHOULDER - 2+ VIEW COMPARISON:  None Available. FINDINGS: There is no evidence of fracture or dislocation. There is no evidence of arthropathy or other focal bone abnormality. Soft tissues are unremarkable. IMPRESSION: Negative. Electronically Signed   By: Kathreen Devoid M.D.   On: 09/21/2021 13:51   DG Chest 1 View  Result Date: 09/21/2021 CLINICAL DATA:  Status post fall.  History of AFib. EXAM: CHEST  1 VIEW COMPARISON:  09/01/2020 FINDINGS: Bilateral mild interstitial  thickening. No focal consolidation. No pleural effusion or pneumothorax. Enlarged central pulmonary vasculature. Stable cardiomegaly. Prior CABG. Dual lead cardiac pacemaker. No acute osseous abnormality. IMPRESSION: 1. Cardiomegaly with mild pulmonary vascular congestion. Electronically Signed   By: Kathreen Devoid M.D.   On: 09/21/2021 13:50   DG Pelvis 1-2 Views  Result Date: 09/21/2021 CLINICAL DATA:  Status post fall. EXAM: PELVIS - 1-2 VIEW COMPARISON:  None Available. FINDINGS: Generalized osteopenia. No acute fracture or dislocation. Right hip arthroplasty. No aggressive osseous lesion. Normal alignment. Soft tissue are unremarkable. No radiopaque foreign body or soft tissue emphysema. IMPRESSION: 1. No acute osseous injury of the pelvis. Electronically Signed   By: Kathreen Devoid M.D.   On: 09/21/2021 13:49    Procedures Procedures    Medications Ordered in ED Medications  amiodarone (NEXTERONE) 1.8 mg/mL load via infusion 150 mg (150 mg Intravenous Not Given 09/21/21 2038)    Followed by  amiodarone (NEXTERONE PREMIX) 360-4.14 MG/200ML-% (1.8 mg/mL) IV infusion (60 mg/hr Intravenous New Bag/Given 09/21/21 2038)    Followed by  amiodarone (NEXTERONE PREMIX) 360-4.14 MG/200ML-% (1.8 mg/mL) IV infusion (has no administration in time range)  enoxaparin (LOVENOX) injection 40 mg (40 mg Subcutaneous Given 09/21/21 2158)  acetaminophen (TYLENOL) tablet 650 mg (has no administration in time range)    Or  acetaminophen (TYLENOL) suppository 650 mg (has no administration in time range)  potassium chloride 10 mEq in 100 mL IVPB (10 mEq Intravenous New Bag/Given 09/22/21 0004)  amiodarone (NEXTERONE) 150-4.21 MG/100ML-% bolus (0 mg  Stopped 09/21/21 2020)  LORazepam (ATIVAN) injection 0.5 mg (0.5 mg Intravenous Given 09/21/21 1957)  ondansetron (ZOFRAN) injection 4 mg (4 mg Intravenous Given 09/21/21 1956)    ED Course/ Medical Decision Making/ A&P                           Medical Decision  Making Amount and/or Complexity of Data Reviewed Labs: ordered. Radiology: ordered.  Risk Prescription drug management. Decision regarding hospitalization.    Medical Screen Complete  This patient presented to the ED with complaint of possible seizure.  This complaint involves an extensive number of treatment options. The initial differential diagnosis includes, but is not limited to, seizure, metabolic abnormality, etc.  This presentation is: Acute, Self-Limited, Previously Undiagnosed, Uncertain Prognosis, Complicated, Systemic Symptoms, and Threat to Life/Bodily Function  Patient returns to ED shortly after DC.  Family noted possible seizure like episode.  Shortly after arrival, patient had another episode - transient unresponsiveness, stiffening of extremities, and then emesis.   [place]  seconds of Vtach captured concurrently on monitor. Spontaneous return to NSR noted.  Amiodarone infusion initiated. Defibrillation pads placed.  Daughter confirms DNR status.   Will plan to admit.  Cardiology and Neurology consulted.  Hospitalist service aware of case.  Co morbidities that complicated the patient's evaluation  Dementia   Additional history obtained:  Additional history obtained from [facility] External records from outside sources obtained and reviewed including prior ED visits and prior Inpatient records.    Lab Tests:  I ordered and personally interpreted labs.  The pertinent results include:  CBC BMP Trop Mag  Cardiac Monitoring:  The patient was maintained on a cardiac monitor.  I personally viewed and interpreted the cardiac monitor which showed an underlying rhythm of: NSR , VTach   Medicines ordered:  I ordered medication including amiodarone, zofran, ativan  for vtach, nausea, agitation  Reevaluation of the patient after these medicines showed that the patient: improved   Problem List / ED Course:  Vtach   Reevaluation:  After the interventions  noted above, I reevaluated the patient and found that they have: improved   Disposition:  After consideration of the diagnostic results and the patients response to treatment, I feel that the patent would benefit from admission.   CRITICAL CARE Performed by: Valarie Merino   Total critical care time: 45 minutes  Critical care time was exclusive of separately billable procedures and treating other patients.  Critical care was necessary to treat or prevent imminent or life-threatening deterioration.  Critical care was time spent personally by me on the following activities: development of treatment plan with patient and/or surrogate as well as nursing, discussions with consultants, evaluation of patient's response to treatment, examination of patient, obtaining history from patient or surrogate, ordering and performing treatments and interventions, ordering and review of laboratory studies, ordering and review of radiographic studies, pulse oximetry and re-evaluation of patient's condition.         Final Clinical Impression(s) / ED Diagnoses Final diagnoses:  Ventricular tachycardia [facility])    Rx / DC Orders ED Discharge Orders     None         Valarie Merino, MD 09/22/21 [phone]

## 2021-09-22 NOTE — Progress Notes (Addendum)
PROGRESS NOTE    [name] [name]  ATF:[id number] DOB: 05/15/1931 DOA: 09/21/2021 PCP: System, Provider Not In  89/F with advanced dementia from memory care unit, history of COPD, CAD with PCI, chronic systolic CHF, orthostatic hypotension who was brought to the ED earlier in the day on 6/12 after an unwitnessed fall, she was discharged and presented back to the ED same evening with possible seizure-like activity with loss of consciousness. -In the emergency room patient was noted to be in polymorphic VT, cardiology was consulted and she was started on amiodarone infusion -Overnight EEG without seizure activity  Subjective: -Patient denies any complaints however is pleasantly confused  Assessment and Plan:  Syncope Polymorphic VT -Started on IV amiodarone last night, being transitioned to p.o. -Cards following, monitor on telemetry today -Conservative management on account of advanced dementia -QTc is mildly prolonged, will discontinue Seroquel and add low-dose Zyprexa nightly (she is no longer on Haldol ) for agitation, dementia, d/w daughter [name] -Plan for conservative management only, -Discharge back to memory care unit tomorrow, plan for palliative care to follow-up at facility  Hypokalemia, hypomagnesemia -Replaced  Seizure-like activity -Secondary to VT, consistent with convulsive syncope -Appreciate neuro input, EEG is negative, discontinue tramadol  Acute on chronic systolic CHF -Appears mildly volume overloaded, IV Lasix x1 today -EF 35-40% on echo 5/22 -Low-dose diuretics PRN at discharge -GDMT limited by orthostatic hypotension and advanced dementia  Paroxysmal atrial fibrillation -Heart rate controlled, currently on amiodarone -Anticoagulation discontinued this admission on account of frequent falls and dementia  History of orthostatic hypotension -Ambulate will get PT eval this admission  Advanced dementia -Resume home meds  Asymptomatic  bacteriuria -Discontinue antibiotics, monitor, afebrile without leukocytosis, no clinical suggestion of infectious process  DVT prophylaxis: Lovenox Code Status: DNR Family Communication: No family at bedside, called and updated daughter [name] Disposition Plan: Back to ALF tomorrow if stable  Consultants: Cards, neurology   Procedures:   Antimicrobials:    Objective: Vitals:   09/22/21 0731 09/22/21 0733 09/22/21 0855 09/22/21 0900  BP: (!) 170/81   (!) 153/88  Pulse: 70 71 70 70  Resp: (!) 24 (!) 21 20 (!) 23  Temp:      TempSrc:      SpO2: 100% 100% 97% 96%    Intake/Output Summary (Last 24 hours) at 09/22/2021 1051 Last data filed at 09/22/2021 0305 Gross per 24 hour  Intake 200 ml  Output --  Net 200 ml   There were no vitals filed for this visit.  Examination:  General exam: Pleasant elderly female laying in bed, eyes closed, mumbles few words, pleasantly confused, disoriented HEENT: JVD mildly elevated CVS: S1-S2, regular rhythm, systolic murmur Lungs: Poor air movement, decreased breath sounds at the bases Abdomen: Soft, nontender, nondistended Extremities: Trace edema  Data Reviewed:   CBC: Recent Labs  Lab 09/21/21 1432 09/21/21 1931 09/21/21 2001 09/22/21 0441  WBC 7.4 10.7*  --  9.6  NEUTROABS 6.0 8.1*  --  7.8*  HGB 11.3* 12.2 13.6 11.7*  HCT 35.8* 40.2 40.0 37.0  MCV 97.8 99.0  --  97.4  PLT 119* 128*  --  270*   Basic Metabolic Panel: Recent Labs  Lab 09/21/21 1432 09/21/21 1931 09/21/21 1955 09/21/21 2001 09/21/21 2324 09/22/21 0441  NA 144 137  --  141  --  140  K 3.5 3.1*  --  3.1*  --  4.0  CL 107 101  --  104  --  106  CO2 27 23  --   --   --  25  GLUCOSE 113* 182*  --  177*  --  118*  BUN 14 13  --  15  --  11  CREATININE 0.91 0.97  --  0.90 0.89 0.84  CALCIUM 8.8* 8.7*  --   --   --  8.3*  MG  --   --  1.8  --   --  1.7   GFR: CrCl cannot be calculated (Unknown ideal weight.). Liver Function Tests: No results for  input(s): "AST", "ALT", "ALKPHOS", "BILITOT", "PROT", "ALBUMIN" in the last 168 hours. No results for input(s): "LIPASE", "AMYLASE" in the last 168 hours. No results for input(s): "AMMONIA" in the last 168 hours. Coagulation Profile: No results for input(s): "INR", "PROTIME" in the last 168 hours. Cardiac Enzymes: No results for input(s): "CKTOTAL", "CKMB", "CKMBINDEX", "TROPONINI" in the last 168 hours. BNP (last 3 results) No results for input(s): "PROBNP" in the last 8760 hours. HbA1C: No results for input(s): "HGBA1C" in the last 72 hours. CBG: Recent Labs  Lab 09/22/21 0034 09/22/21 0643  GLUCAP 141* 120*   Lipid Profile: No results for input(s): "CHOL", "HDL", "LDLCALC", "TRIG", "CHOLHDL", "LDLDIRECT" in the last 72 hours. Thyroid Function Tests: No results for input(s): "TSH", "T4TOTAL", "FREET4", "T3FREE", "THYROIDAB" in the last 72 hours. Anemia Panel: No results for input(s): "VITAMINB12", "FOLATE", "FERRITIN", "TIBC", "IRON", "RETICCTPCT" in the last 72 hours. Urine analysis:    Component Value Date/Time   COLORURINE AMBER (A) 09/21/2021 1725   APPEARANCEUR HAZY (A) 09/21/2021 1725   LABSPEC 1.032 (H) 09/21/2021 1725   PHURINE 5.0 09/21/2021 1725   GLUCOSEU NEGATIVE 09/21/2021 1725   GLUCOSEU NEGATIVE 12/29/2016 1558   HGBUR NEGATIVE 09/21/2021 1725   BILIRUBINUR NEGATIVE 09/21/2021 1725   BILIRUBINUR Positive 08/17/2019 1456   KETONESUR 20 (A) 09/21/2021 1725   PROTEINUR 100 (A) 09/21/2021 1725   UROBILINOGEN 0.2 08/17/2019 1456   UROBILINOGEN 0.2 12/29/2016 1558   NITRITE NEGATIVE 09/21/2021 1725   LEUKOCYTESUR NEGATIVE 09/21/2021 1725   Sepsis Labs: '@LABRCNTIP'$ (procalcitonin:4,lacticidven:4)  ) Recent Results (from the past 240 hour(s))  SARS Coronavirus 2 by RT PCR (hospital order, performed in [facility]) *cepheid single result test* Anterior Nasal Swab     Status: None   Collection Time: 09/21/21  2:39 PM   Specimen: Anterior Nasal Swab   Result Value Ref Range Status   SARS Coronavirus 2 by RT PCR NEGATIVE NEGATIVE Final    Comment: (NOTE) SARS-CoV-2 target nucleic acids are NOT DETECTED.  The SARS-CoV-2 RNA is generally detectable in upper and lower respiratory specimens during the acute phase of infection. The lowest concentration of SARS-CoV-2 viral copies this assay can detect is 250 copies / mL. A negative result does not preclude SARS-CoV-2 infection and should not be used as the sole basis for treatment or other patient management decisions.  A negative result may occur with improper specimen collection / handling, submission of specimen other than nasopharyngeal swab, presence of viral mutation(s) within the areas targeted by this assay, and inadequate number of viral copies (<250 copies / mL). A negative result must be combined with clinical observations, patient history, and epidemiological information.  Fact Sheet for Patients:   https://www.patel.info/  Fact Sheet for Healthcare Providers: https://hall.com/  This test is not yet approved or  cleared by the [place] FDA and has been authorized for detection and/or diagnosis of SARS-CoV-2 by FDA under an Emergency Use Authorization (EUA).  This EUA will remain in effect (meaning this test can be used) for the duration  of the COVID-19 declaration under Section 564(b)(1) of the Act, 21 U.S.C. section 360bbb-3(b)(1), unless the authorization is terminated or revoked sooner.  Performed at [facility], [place] [address], [place], [place] [address]      Radiology Studies: EEG adult  Result Date: 09-26-2021 Lora Havens, MD     September 26, 2021  8:18 AM Patient Name: Terica Yogi MRN: [id number] Epilepsy Attending: Lora Havens Referring Physician/Provider: Kerney Elbe, MD Date: 09/21/2021 Duration: 21.11 mins Patient history: 88 year old female with a history of dementia who presented twice to the ED  today, initially for evaluation after an unwitnessed fall versus syncope, the second visit after seizure-like activity was witnessed by family while on the way home following the first evaluation. EEG to evaluate for seizure Level of alertness: Awake, asleep AEDs during EEG study: None Technical aspects: This EEG study was done with scalp electrodes positioned according to the 10-20 International system of electrode placement. Electrical activity was acquired at a sampling rate of '500Hz'$  and reviewed with a high frequency filter of '70Hz'$  and a low frequency filter of '1Hz'$ . EEG data were recorded continuously and digitally stored. Description: The posterior dominant rhythm consists of 7 Hz activity of moderate voltage (25-35 uV) seen predominantly in posterior head regions, symmetric and reactive to eye opening and eye closing. Sleep was characterized by vertex waves, sleep spindles (12 to 14 Hz), maximal frontocentral region.  EEG showed continuous generalized 3 to 6 Hz theta-delta slowing. Hyperventilation and photic stimulation were not performed.   ABNORMALITY - Continuous slow, generalized IMPRESSION: This study is suggestive of mild to moderate diffuse encephalopathy, nonspecific etiology. No seizures or epileptiform discharges were seen throughout the recording. Lora Havens   DG Chest Port 1 View  Result Date: 09/21/2021 CLINICAL DATA:  Chest pain EXAM: PORTABLE CHEST 1 VIEW COMPARISON:  09/21/2021 FINDINGS: Cardiomegaly, vascular congestion. Interstitial prominence may reflect interstitial edema. No effusions or acute bony abnormality. Prior CABG.  Left pacer remains in place, unchanged. IMPRESSION: Cardiomegaly with vascular congestion. Possible early interstitial edema. Electronically Signed   By: Rolm Baptise M.D.   On: 09/21/2021 20:58   CT Head Wo Contrast  Result Date: 09/21/2021 CLINICAL DATA:  Headache, trauma EXAM: CT HEAD WITHOUT CONTRAST CT CERVICAL SPINE WITHOUT CONTRAST TECHNIQUE:  Multidetector CT imaging of the head and cervical spine was performed following the standard protocol without intravenous contrast. Multiplanar CT image reconstructions of the cervical spine were also generated. RADIATION DOSE REDUCTION: This exam was performed according to the departmental dose-optimization program which includes automated exposure control, adjustment of the mA and/or kV according to patient size and/or use of iterative reconstruction technique. COMPARISON:  CT head and cervical spine 09/01/2020 FINDINGS: CT HEAD FINDINGS Brain: There is no evidence of acute intracranial hemorrhage, extra-axial fluid collection, or acute infarct. There is mild-to-moderate global parenchymal volume loss with prominence of the ventricular system and extra-axial CSF spaces, not significantly changed since the prior study from 2022. Patchy and confluent hypodensity throughout the subcortical and periventricular white matter likely reflects sequela of advanced chronic white matter microangiopathy. A probable small calcified meningioma overlying the right frontal lobe is unchanged. There is no other solid mass lesion. There is no mass effect or midline shift. Vascular: There is calcification of the bilateral cavernous ICAs. Skull: Normal. Negative for fracture or focal lesion. Sinuses/Orbits: The imaged paranasal sinuses are clear. Bilateral lens implants are in place. The globes and orbits are otherwise unremarkable. Other: None. CT CERVICAL SPINE FINDINGS Alignment:  There is slight reversal of the normal cervical lordosis centered at C5, unchanged. There is no antero or retrolisthesis. There is no jumped or perched facets or other evidence of traumatic malalignment. Skull base and vertebrae: Skull base alignment is maintained. Vertebral body heights are preserved. There is no evidence of acute fracture. Ankylosis of the facet joints at C2 through C4 is unchanged. Soft tissues and spinal canal: No prevertebral fluid or  swelling. No visible canal hematoma. Disc levels: Multilevel disc space narrowing and degenerative endplate changes again seen, most advanced at C4-C5 and C5-C6. Multilevel facet arthropathy is also similar to the prior study. There is no evidence of high-grade spinal canal stenosis. Upper chest: The imaged lung apices are clear. Other: None. IMPRESSION: 1. No acute intracranial pathology. 2. No acute fracture or traumatic malalignment of the cervical spine. Electronically Signed   By: [name] M.D.   On: 09/21/2021 14:14   CT Cervical Spine Wo Contrast  Result Date: 09/21/2021 CLINICAL DATA:  Headache, trauma EXAM: CT HEAD WITHOUT CONTRAST CT CERVICAL SPINE WITHOUT CONTRAST TECHNIQUE: Multidetector CT imaging of the head and cervical spine was performed following the standard protocol without intravenous contrast. Multiplanar CT image reconstructions of the cervical spine were also generated. RADIATION DOSE REDUCTION: This exam was performed according to the departmental dose-optimization program which includes automated exposure control, adjustment of the mA and/or kV according to patient size and/or use of iterative reconstruction technique. COMPARISON:  CT head and cervical spine 09/01/2020 FINDINGS: CT HEAD FINDINGS Brain: There is no evidence of acute intracranial hemorrhage, extra-axial fluid collection, or acute infarct. There is mild-to-moderate global parenchymal volume loss with prominence of the ventricular system and extra-axial CSF spaces, not significantly changed since the prior study from 2022. Patchy and confluent hypodensity throughout the subcortical and periventricular white matter likely reflects sequela of advanced chronic white matter microangiopathy. A probable small calcified meningioma overlying the right frontal lobe is unchanged. There is no other solid mass lesion. There is no mass effect or midline shift. Vascular: There is calcification of the bilateral cavernous ICAs. Skull:  Normal. Negative for fracture or focal lesion. Sinuses/Orbits: The imaged paranasal sinuses are clear. Bilateral lens implants are in place. The globes and orbits are otherwise unremarkable. Other: None. CT CERVICAL SPINE FINDINGS Alignment: There is slight reversal of the normal cervical lordosis centered at C5, unchanged. There is no antero or retrolisthesis. There is no jumped or perched facets or other evidence of traumatic malalignment. Skull base and vertebrae: Skull base alignment is maintained. Vertebral body heights are preserved. There is no evidence of acute fracture. Ankylosis of the facet joints at C2 through C4 is unchanged. Soft tissues and spinal canal: No prevertebral fluid or swelling. No visible canal hematoma. Disc levels: Multilevel disc space narrowing and degenerative endplate changes again seen, most advanced at C4-C5 and C5-C6. Multilevel facet arthropathy is also similar to the prior study. There is no evidence of high-grade spinal canal stenosis. Upper chest: The imaged lung apices are clear. Other: None. IMPRESSION: 1. No acute intracranial pathology. 2. No acute fracture or traumatic malalignment of the cervical spine. Electronically Signed   By: [name] M.D.   On: 09/21/2021 14:14   DG Shoulder Left  Result Date: 09/21/2021 CLINICAL DATA:  Status post fall, shoulder pain EXAM: LEFT SHOULDER - 2+ VIEW COMPARISON:  None Available. FINDINGS: There is no evidence of fracture or dislocation. There is no evidence of arthropathy or other focal bone abnormality. Soft tissues are unremarkable. IMPRESSION:  Negative. Electronically Signed   By: Kathreen Devoid M.D.   On: 09/21/2021 13:51   DG Chest 1 View  Result Date: 09/21/2021 CLINICAL DATA:  Status post fall.  History of AFib. EXAM: CHEST  1 VIEW COMPARISON:  09/01/2020 FINDINGS: Bilateral mild interstitial thickening. No focal consolidation. No pleural effusion or pneumothorax. Enlarged central pulmonary vasculature. Stable  cardiomegaly. Prior CABG. Dual lead cardiac pacemaker. No acute osseous abnormality. IMPRESSION: 1. Cardiomegaly with mild pulmonary vascular congestion. Electronically Signed   By: Kathreen Devoid M.D.   On: 09/21/2021 13:50   DG Pelvis 1-2 Views  Result Date: 09/21/2021 CLINICAL DATA:  Status post fall. EXAM: PELVIS - 1-2 VIEW COMPARISON:  None Available. FINDINGS: Generalized osteopenia. No acute fracture or dislocation. Right hip arthroplasty. No aggressive osseous lesion. Normal alignment. Soft tissue are unremarkable. No radiopaque foreign body or soft tissue emphysema. IMPRESSION: 1. No acute osseous injury of the pelvis. Electronically Signed   By: Kathreen Devoid M.D.   On: 09/21/2021 13:49     Scheduled Meds:  amiodarone  400 mg Oral BID   [START ON 10/03/2021] amiodarone  400 mg Oral Daily   enoxaparin (LOVENOX) injection  40 mg Subcutaneous Q24H   furosemide  40 mg Intravenous Once   Continuous Infusions:  cefTRIAXone (ROCEPHIN)  IV       LOS: 1 day    Time spent: 34mn    PDomenic Polite MD [facility]   09/22/2021, 10:51 AM

## 2021-09-22 NOTE — ED Notes (Signed)
Lunch tray ordered 

## 2021-09-23 DIAGNOSIS — I4729 Other ventricular tachycardia: Secondary | ICD-10-CM

## 2021-09-23 DIAGNOSIS — S41112A Laceration without foreign body of left upper arm, initial encounter: Secondary | ICD-10-CM

## 2021-09-23 DIAGNOSIS — R8271 Bacteriuria: Secondary | ICD-10-CM

## 2021-09-23 DIAGNOSIS — I472 Ventricular tachycardia, unspecified: Secondary | ICD-10-CM | POA: Diagnosis not present

## 2021-09-23 DIAGNOSIS — R569 Unspecified convulsions: Secondary | ICD-10-CM

## 2021-09-23 LAB — GLUCOSE, CAPILLARY
Glucose-Capillary: 105 mg/dL — ABNORMAL HIGH (ref 70–99)
Glucose-Capillary: 108 mg/dL — ABNORMAL HIGH (ref 70–99)
Glucose-Capillary: 123 mg/dL — ABNORMAL HIGH (ref 70–99)
Glucose-Capillary: 132 mg/dL — ABNORMAL HIGH (ref 70–99)
Glucose-Capillary: 96 mg/dL (ref 70–99)

## 2021-09-23 LAB — BASIC METABOLIC PANEL WITH GFR
Anion gap: 10 (ref 5–15)
BUN: 10 mg/dL (ref 8–23)
CO2: 29 mmol/L (ref 22–32)
Calcium: 8.2 mg/dL — ABNORMAL LOW (ref 8.9–10.3)
Chloride: 104 mmol/L (ref 98–111)
Creatinine, Ser: 0.83 mg/dL (ref 0.44–1.00)
GFR, Estimated: >60 mL/min
Glucose, Bld: 103 mg/dL — ABNORMAL HIGH (ref 70–99)
Potassium: 3.1 mmol/L — ABNORMAL LOW (ref 3.5–5.1)
Sodium: 143 mmol/L (ref 135–145)

## 2021-09-23 LAB — BASIC METABOLIC PANEL
Anion gap: 12 (ref 5–15)
BUN: 10 mg/dL (ref 8–23)
CO2: 24 mmol/L (ref 22–32)
Calcium: 8.3 mg/dL — ABNORMAL LOW (ref 8.9–10.3)
Chloride: 104 mmol/L (ref 98–111)
Creatinine, Ser: 0.82 mg/dL (ref 0.44–1.00)
GFR, Estimated: >60 mL/min (ref >60)
Glucose, Bld: 91 mg/dL (ref 70–99)
Potassium: 3.9 mmol/L (ref 3.5–5.1)
Sodium: 140 mmol/L (ref 135–145)

## 2021-09-23 LAB — CBC
HCT: 34 % — ABNORMAL LOW (ref 36.0–46.0)
Hemoglobin: 11.1 g/dL — ABNORMAL LOW (ref 12.0–15.0)
MCH: 31.4 pg (ref 26.0–34.0)
MCHC: 32.6 g/dL (ref 30.0–36.0)
MCV: 96.3 fL (ref 80.0–100.0)
Platelets: 101 10*3/uL — ABNORMAL LOW (ref 150–400)
RBC: 3.53 MIL/uL — ABNORMAL LOW (ref 3.87–5.11)
RDW: 14.9 % (ref 11.5–15.5)
WBC: 6.7 10*3/uL (ref 4.0–10.5)
nRBC: 0 % (ref 0.0–0.2)

## 2021-09-23 LAB — MAGNESIUM: Magnesium: 2.2 mg/dL (ref 1.7–2.4)

## 2021-09-23 MED ORDER — ESCITALOPRAM OXALATE 10 MG PO TABS
10.0000 mg | ORAL_TABLET | Freq: Every day | ORAL | Status: DC
  Administered 2021-09-24 – 2021-09-29 (×6): 10 mg via ORAL
  Filled 2021-09-23 (×6): qty 1

## 2021-09-23 MED ORDER — OLANZAPINE 2.5 MG PO TABS
2.5000 mg | ORAL_TABLET | Freq: Every day | ORAL | Status: DC
  Administered 2021-09-23 – 2021-09-28 (×6): 2.5 mg via ORAL
  Filled 2021-09-23 (×7): qty 1

## 2021-09-23 MED ORDER — POTASSIUM CHLORIDE CRYS ER 20 MEQ PO TBCR
40.0000 meq | EXTENDED_RELEASE_TABLET | Freq: Once | ORAL | Status: AC
Start: 2021-09-23 — End: 2021-09-23
  Administered 2021-09-23: 40 meq via ORAL
  Filled 2021-09-23: qty 2

## 2021-09-23 MED ORDER — SPIRONOLACTONE 12.5 MG HALF TABLET
12.5000 mg | ORAL_TABLET | Freq: Every day | ORAL | Status: DC
  Administered 2021-09-23 – 2021-09-29 (×7): 12.5 mg via ORAL
  Filled 2021-09-23 (×7): qty 1

## 2021-09-23 MED ORDER — APIXABAN 2.5 MG PO TABS
2.5000 mg | ORAL_TABLET | Freq: Two times a day (BID) | ORAL | Status: DC
  Administered 2021-09-23 – 2021-09-29 (×12): 2.5 mg via ORAL
  Filled 2021-09-23 (×12): qty 1

## 2021-09-23 MED ORDER — POTASSIUM CHLORIDE CRYS ER 20 MEQ PO TBCR
40.0000 meq | EXTENDED_RELEASE_TABLET | ORAL | Status: AC
  Administered 2021-09-23 (×2): 40 meq via ORAL
  Filled 2021-09-23 (×2): qty 2

## 2021-09-23 NOTE — Assessment & Plan Note (Signed)
Goal >2

## 2021-09-23 NOTE — Assessment & Plan Note (Signed)
Initially was on hold, but after discussion with Dr. Caryl Comes, planning to resume this at reduced dose 2.5 mg BID

## 2021-09-23 NOTE — Assessment & Plan Note (Addendum)
In setting of torsades, above Hx orthostatic hypotension She's deconditioned after this hospitalization.  Plan for follow up at SNF for rehab.

## 2021-09-23 NOTE — Assessment & Plan Note (Addendum)
Reduce lexapro, reduce zyprexa discussed with pharmacy [facility] [place] for palliative outpatient Remains pleasantly confused.  Needs assistance with meals at this time - please continue to cut up food, offer assistance with meals as needed at SNF.  Delirium precautions.  Continue PT/OT, OOB.

## 2021-09-23 NOTE — Hospital Course (Addendum)
89/F with advanced dementia from memory care unit, history of COPD, CAD with PCI, chronic systolic CHF, orthostatic hypotension who was brought to the ED earlier in the day on 6/12 after an unwitnessed fall, she was discharged and presented back to the ED same evening with possible seizure-like activity with loss of consciousness.  In the emergency room patient was noted to be in polymorphic VT, cardiology was consulted and she was started on amiodarone infusion.  Neurology was consulted and recommended correcting metabolic derangements, hydration, consider compression stockings.  Cardiology has followed and EP consulted.  PMVT thought due to QT prolonging meds and hypokalemia.  She's now been started on metoprolol and spironolactone.  Her amiodarone was d/c'd and her haldol and seroquel were discontinued as well.  The lexapro has been decreased and she's been started on low dose zyprexa.    Her hospitalization has been complicated by delirium and deconditioning.  Plan at this point is discharge to SNF prior to going back to memory care.  See below for additional details

## 2021-09-23 NOTE — Progress Notes (Addendum)
Progress Note  Patient Name: [name] [name] Date of Encounter: 09/23/2021  Primary Cardiologist: Virl Axe, MD   Subjective   More conversant today.   Tolerated off O2 with Sat's 100. She notes that she has a hurt in her heart that has lasted a long time. AOX person only.  Inpatient Medications    Scheduled Meds:  amiodarone  400 mg Oral BID   [START ON 10/03/2021] amiodarone  400 mg Oral Daily   enoxaparin (LOVENOX) injection  40 mg Subcutaneous Q24H   escitalopram  20 mg Oral Daily   melatonin  5 mg Oral QHS   memantine  5 mg Oral BID   OLANZapine  5 mg Oral QHS   potassium chloride  40 mEq Oral Q4H   pyridostigmine  60 mg Oral Daily   Continuous Infusions:   PRN Meds: acetaminophen **OR** acetaminophen, hydrALAZINE   Vital Signs    Vitals:   09/22/21 2010 09/23/21 0024 09/23/21 0523 09/23/21 0802  BP: (!) 149/73 138/65 129/63 (!) 146/66  Pulse: 71 69 70 70  Resp: 18   18  Temp: 98.2 F (36.8 C) (!) 97.5 F (36.4 C) 97.7 F (36.5 C) 98.5 F (36.9 C)  TempSrc: Oral Axillary Axillary Axillary  SpO2: 92% 100% 93% 98%    Intake/Output Summary (Last 24 hours) at 09/23/2021 1001 Last data filed at 09/22/2021 1821 Gross per 24 hour  Intake 240 ml  Output 1400 ml  Net -1160 ml   There were no vitals filed for this visit.  Telemetry    No pause dependent VT; no VT - Personally Reviewed AF V Paced rhythm on ECG JTc 422  Physical Exam   Gen: no distress, Neck: No JVD supine Cardiac: No Rubs or Gallops, systolic murmur, RRR +2 radial pulses Respiratory: Decreased breath sounds bilaterally GI: Soft, nontender, non-distended  MS: Non pitting edema;  moves all extremities Integument: Skin feels warm Neuropsych:  Not conversant   Labs    Chemistry Recent Labs  Lab 09/21/21 1931 09/21/21 2001 09/21/21 2324 09/22/21 0441 09/23/21 0103  NA 137 141  --  140 143  K 3.1* 3.1*  --  4.0 3.1*  CL 101 104  --  106 104  CO2 23  --   --  25 29   GLUCOSE 182* 177*  --  118* 103*  BUN 13 15  --  11 10  CREATININE 0.97 0.90 0.89 0.84 0.83  CALCIUM 8.7*  --   --  8.3* 8.2*  GFRNONAA 57*  --  >60 >60 >60  ANIONGAP 13  --   --  9 10     Hematology Recent Labs  Lab 09/21/21 1931 09/21/21 2001 09/22/21 0441 09/23/21 0103  WBC 10.7*  --  9.6 6.7  RBC 4.06  --  3.80* 3.53*  HGB 12.2 13.6 11.7* 11.1*  HCT 40.2 40.0 37.0 34.0*  MCV 99.0  --  97.4 96.3  MCH 30.0  --  30.8 31.4  MCHC 30.3  --  31.6 32.6  RDW 15.1  --  15.1 14.9  PLT 128*  --  112* 101*    Cardiac EnzymesNo results for input(s): "TROPONINI" in the last 168 hours. No results for input(s): "TROPIPOC" in the last 168 hours.   BNP Recent Labs  Lab 09/21/21 1432  BNP 862.4*     DDimer No results for input(s): "DDIMER" in the last 168 hours.   Radiology    EEG adult  Result Date: 09/22/2021 Zeb Comfort  Jenetta Downer, MD     09/22/2021  8:18 AM Patient Name: [name] [name] MRN: [id number] Epilepsy Attending: Lora Havens Referring Physician/Provider: Kerney Elbe, MD Date: 09/21/2021 Duration: 21.11 mins Patient history: 88 year old female with a history of dementia who presented twice to the ED today, initially for evaluation after an unwitnessed fall versus syncope, the second visit after seizure-like activity was witnessed by family while on the way home following the first evaluation. EEG to evaluate for seizure Level of alertness: Awake, asleep AEDs during EEG study: None Technical aspects: This EEG study was done with scalp electrodes positioned according to the 10-20 International system of electrode placement. Electrical activity was acquired at a sampling rate of '500Hz'$  and reviewed with a high frequency filter of '70Hz'$  and a low frequency filter of '1Hz'$ . EEG data were recorded continuously and digitally stored. Description: The posterior dominant rhythm consists of 7 Hz activity of moderate voltage (25-35 uV) seen predominantly in posterior head regions, symmetric and  reactive to eye opening and eye closing. Sleep was characterized by vertex waves, sleep spindles (12 to 14 Hz), maximal frontocentral region.  EEG showed continuous generalized 3 to 6 Hz theta-delta slowing. Hyperventilation and photic stimulation were not performed.   ABNORMALITY - Continuous slow, generalized IMPRESSION: This study is suggestive of mild to moderate diffuse encephalopathy, nonspecific etiology. No seizures or epileptiform discharges were seen throughout the recording. Lora Havens   DG Chest Port 1 View  Result Date: 09/21/2021 CLINICAL DATA:  Chest pain EXAM: PORTABLE CHEST 1 VIEW COMPARISON:  09/21/2021 FINDINGS: Cardiomegaly, vascular congestion. Interstitial prominence may reflect interstitial edema. No effusions or acute bony abnormality. Prior CABG.  Left pacer remains in place, unchanged. IMPRESSION: Cardiomegaly with vascular congestion. Possible early interstitial edema. Electronically Signed   By: Rolm Baptise M.D.   On: 09/21/2021 20:58   CT Head Wo Contrast  Result Date: 09/21/2021 CLINICAL DATA:  Headache, trauma EXAM: CT HEAD WITHOUT CONTRAST CT CERVICAL SPINE WITHOUT CONTRAST TECHNIQUE: Multidetector CT imaging of the head and cervical spine was performed following the standard protocol without intravenous contrast. Multiplanar CT image reconstructions of the cervical spine were also generated. RADIATION DOSE REDUCTION: This exam was performed according to the departmental dose-optimization program which includes automated exposure control, adjustment of the mA and/or kV according to patient size and/or use of iterative reconstruction technique. COMPARISON:  CT head and cervical spine 09/01/2020 FINDINGS: CT HEAD FINDINGS Brain: There is no evidence of acute intracranial hemorrhage, extra-axial fluid collection, or acute infarct. There is mild-to-moderate global parenchymal volume loss with prominence of the ventricular system and extra-axial CSF spaces, not significantly  changed since the prior study from 2022. Patchy and confluent hypodensity throughout the subcortical and periventricular white matter likely reflects sequela of advanced chronic white matter microangiopathy. A probable small calcified meningioma overlying the right frontal lobe is unchanged. There is no other solid mass lesion. There is no mass effect or midline shift. Vascular: There is calcification of the bilateral cavernous ICAs. Skull: Normal. Negative for fracture or focal lesion. Sinuses/Orbits: The imaged paranasal sinuses are clear. Bilateral lens implants are in place. The globes and orbits are otherwise unremarkable. Other: None. CT CERVICAL SPINE FINDINGS Alignment: There is slight reversal of the normal cervical lordosis centered at C5, unchanged. There is no antero or retrolisthesis. There is no jumped or perched facets or other evidence of traumatic malalignment. Skull base and vertebrae: Skull base alignment is maintained. Vertebral body heights are preserved. There is no evidence of  acute fracture. Ankylosis of the facet joints at C2 through C4 is unchanged. Soft tissues and spinal canal: No prevertebral fluid or swelling. No visible canal hematoma. Disc levels: Multilevel disc space narrowing and degenerative endplate changes again seen, most advanced at C4-C5 and C5-C6. Multilevel facet arthropathy is also similar to the prior study. There is no evidence of high-grade spinal canal stenosis. Upper chest: The imaged lung apices are clear. Other: None. IMPRESSION: 1. No acute intracranial pathology. 2. No acute fracture or traumatic malalignment of the cervical spine. Electronically Signed   By: [name] M.D.   On: 09/21/2021 14:14   CT Cervical Spine Wo Contrast  Result Date: 09/21/2021 CLINICAL DATA:  Headache, trauma EXAM: CT HEAD WITHOUT CONTRAST CT CERVICAL SPINE WITHOUT CONTRAST TECHNIQUE: Multidetector CT imaging of the head and cervical spine was performed following the standard  protocol without intravenous contrast. Multiplanar CT image reconstructions of the cervical spine were also generated. RADIATION DOSE REDUCTION: This exam was performed according to the departmental dose-optimization program which includes automated exposure control, adjustment of the mA and/or kV according to patient size and/or use of iterative reconstruction technique. COMPARISON:  CT head and cervical spine 09/01/2020 FINDINGS: CT HEAD FINDINGS Brain: There is no evidence of acute intracranial hemorrhage, extra-axial fluid collection, or acute infarct. There is mild-to-moderate global parenchymal volume loss with prominence of the ventricular system and extra-axial CSF spaces, not significantly changed since the prior study from 2022. Patchy and confluent hypodensity throughout the subcortical and periventricular white matter likely reflects sequela of advanced chronic white matter microangiopathy. A probable small calcified meningioma overlying the right frontal lobe is unchanged. There is no other solid mass lesion. There is no mass effect or midline shift. Vascular: There is calcification of the bilateral cavernous ICAs. Skull: Normal. Negative for fracture or focal lesion. Sinuses/Orbits: The imaged paranasal sinuses are clear. Bilateral lens implants are in place. The globes and orbits are otherwise unremarkable. Other: None. CT CERVICAL SPINE FINDINGS Alignment: There is slight reversal of the normal cervical lordosis centered at C5, unchanged. There is no antero or retrolisthesis. There is no jumped or perched facets or other evidence of traumatic malalignment. Skull base and vertebrae: Skull base alignment is maintained. Vertebral body heights are preserved. There is no evidence of acute fracture. Ankylosis of the facet joints at C2 through C4 is unchanged. Soft tissues and spinal canal: No prevertebral fluid or swelling. No visible canal hematoma. Disc levels: Multilevel disc space narrowing and  degenerative endplate changes again seen, most advanced at C4-C5 and C5-C6. Multilevel facet arthropathy is also similar to the prior study. There is no evidence of high-grade spinal canal stenosis. Upper chest: The imaged lung apices are clear. Other: None. IMPRESSION: 1. No acute intracranial pathology. 2. No acute fracture or traumatic malalignment of the cervical spine. Electronically Signed   By: [name] M.D.   On: 09/21/2021 14:14   DG Shoulder Left  Result Date: 09/21/2021 CLINICAL DATA:  Status post fall, shoulder pain EXAM: LEFT SHOULDER - 2+ VIEW COMPARISON:  None Available. FINDINGS: There is no evidence of fracture or dislocation. There is no evidence of arthropathy or other focal bone abnormality. Soft tissues are unremarkable. IMPRESSION: Negative. Electronically Signed   By: Kathreen Devoid M.D.   On: 09/21/2021 13:51   DG Chest 1 View  Result Date: 09/21/2021 CLINICAL DATA:  Status post fall.  History of AFib. EXAM: CHEST  1 VIEW COMPARISON:  09/01/2020 FINDINGS: Bilateral mild interstitial thickening. No focal consolidation.  No pleural effusion or pneumothorax. Enlarged central pulmonary vasculature. Stable cardiomegaly. Prior CABG. Dual lead cardiac pacemaker. No acute osseous abnormality. IMPRESSION: 1. Cardiomegaly with mild pulmonary vascular congestion. Electronically Signed   By: Kathreen Devoid M.D.   On: 09/21/2021 13:50   DG Pelvis 1-2 Views  Result Date: 09/21/2021 CLINICAL DATA:  Status post fall. EXAM: PELVIS - 1-2 VIEW COMPARISON:  None Available. FINDINGS: Generalized osteopenia. No acute fracture or dislocation. Right hip arthroplasty. No aggressive osseous lesion. Normal alignment. Soft tissue are unremarkable. No radiopaque foreign body or soft tissue emphysema. IMPRESSION: 1. No acute osseous injury of the pelvis. Electronically Signed   By: Kathreen Devoid M.D.   On: 09/21/2021 13:49     Patient Profile     88 y.o. female with syncope ion the setting of severe  dementia  Assessment & Plan    Syncope in the setting of Severe Dementia VT with evidence of [place] Hypertension With orthostatic hypotension High grade heart block s/p CRT-P (device dependent St- J P) CAD with LAD PCI and LVEF 25-30% AF with CHADSVASC 6 AC stopped in the setting of GOC - Seen by EP: will continue amiodarone 400 mg PO BID for ten days than 400 mg PO daily unless EP eval - given her improved SOB, her hypokalemia (Mag 2.2 today) will discuss with daughter about standing lasix - GOC is conservative management  ADDENDUM: Discussed with EP; they recommend stopping amiodarone, starting aldactone; will make these changes.  For questions or updates, please contact [facility] Please consult [email or web address] for contact info under Cardiology/STEMI.      Rudean Haskell, MD Cardiologist [facility] [place], [address], [place] [address] [phone]  10:01 AM

## 2021-09-23 NOTE — Assessment & Plan Note (Addendum)
Appears euvolemic, continue home meds Echo 08/2020 with EF 35-40%

## 2021-09-23 NOTE — TOC Initial Note (Addendum)
Transition of Care [facility]) - Initial/Assessment Note    Patient Details  Name: [name] [name] MRN: [id number] Date of Birth: 07-20-1931  [facility] CM/SW Contact:    Milas Gain, [place] Phone Number: 09/23/2021, 11:01 AM  Clinical Narrative:                  Update- CSW spoke with patients daughter [name] who confirmed patient comes from [place] [facility]. [name] confirmed patient will need PTAR when medically ready for dc. Patients daughter [name] gave CSW permission to add her spouse Danae Chen son-n-law to patients contacts. CSW added pateints son-n-law Jeneen Rinks to patients contacts. CSW spoke with both Jeneen Rinks and [name] regarding patients dc plan. CSW called [facility] memory care and spoke with [place]. Altamese Dilling confirmed she can accept patient back to memory care when patient is medically ready for dc. Fax # to send FL2 and DC summary is # [phone]. CSW will continue to follow and assist with patients dc planning needs.    CSW left voicemail for patients daughter [name]. CSW awaiting callback. CSW will continue to follow and assist with patients dc planning needs.    Patient Goals and CMS Choice        Expected Discharge Plan and Services                                                Prior Living Arrangements/Services                       Activities of Daily Living      Permission Sought/Granted                  Emotional Assessment              Admission diagnosis:  Ventricular tachycardia ([place]) [I47.20] V tach ([place]) [I47.20] Patient Active Problem List   Diagnosis Date Noted   Fall at nursing home, initial encounter 09/21/2021   DNR (do not resuscitate) 09/21/2021   V tach ([place]) 09/21/2021   Closed fracture of right femur, unspecified fracture morphology, initial encounter ([place]) 09/02/2020   Fracture of femoral neck, right ([place]) 09/01/2020   Closed hip fracture, right, initial encounter ([place])  09/01/2020   Chronic obstructive pulmonary disease ([place]) 03/20/2019   Major depressive disorder with single episode, in partial remission ([place]) 03/20/2019   Arthritis of carpometacarpal ([place]) joint of left thumb 06/02/2017   Current use of long term anticoagulation 06/02/2017   HCAP (healthcare-associated pneumonia) 04/04/2017   Traumatic perinephric hematoma of left kidney 03/29/2017   Acute blood loss as cause of postoperative anemia 03/29/2017   Left renal mass 01/27/2017   Overactive bladder 07/09/2016   Dizziness 05/07/2016   Microscopic hematuria 09/02/2015   CAD (coronary artery disease) 04/17/2014   Allergic rhinitis 04/17/2014   Dementia ([place]) 04/17/2014   Hearing loss 04/17/2014   Pacemaker -CRT-St. Jude 05/21/2013   Atrioventricular block, complete ([place]) 03/08/2013   Paroxysmal atrial fibrillation ([place]) 02/03/2013   Asthma, intrinsic 02/03/2013   Chronic combined systolic and diastolic congestive heart failure ([place]) 10/10/2012   Depression 09/19/2012   Varicose veins 09/29/2009   History of melanoma 09/06/2008   LBBB (left bundle branch block) 09/06/2008   History of uterine cancer 09/06/2008   Hyperlipidemia 10/06/2007   Essential hypertension 10/06/2007   Ischemic cardiomyopathy  EF  30% cath 4/14 with stent 10/06/2007   PCP:  System, Provider Not In Pharmacy:   [facility], [place] [address] Phone: [phone] Fax: [phone]     Social Determinants of Health (SDOH) Interventions    Readmission Risk Interventions     No data to display

## 2021-09-23 NOTE — Progress Notes (Signed)
ANTICOAGULATION CONSULT NOTE - Initial Consult  Pharmacy Consult for apixaban Indication: atrial fibrillation  Allergies  Allergen Reactions   Aricept [Donepezil] Shortness Of Breath and Other (See Comments)    Hallucinations and loss of sensation in legs and "Allergic," per [facility]   Nitroglycerin Other (See Comments)    Oral spray form causes rapid drop in blood pressure. Patient is sensitive to tabs which causes rapid drop in blood pressure. Is ok to use oral spray form. "Allergic," per [facility]    Patient Measurements: -reported wt 144 lb (~ 65 kg)  Vital Signs: Temp: 98.5 F (36.9 C) (06/14 0802) Temp Source: Axillary (06/14 0802) BP: 146/66 (06/14 0802) Pulse Rate: 70 (06/14 0802)  Labs: Recent Labs    09/21/21 1725 09/21/21 1931 09/21/21 2001 09/21/21 2324 09/22/21 0441 09/23/21 0103  HGB  --  12.2 13.6  --  11.7* 11.1*  HCT  --  40.2 40.0  --  37.0 34.0*  PLT  --  128*  --   --  112* 101*  CREATININE  --  0.97 0.90 0.89 0.84 0.83  TROPONINIHS 25* 33*  --  40*  --   --     CrCl cannot be calculated (Unknown ideal weight.).   Medical History: Past Medical History:  Diagnosis Date   Atrial fibrillation ([place]) 10/06/2007   post op   COPD (chronic obstructive pulmonary disease) ([place])    Emphysema on 02/03/13 CXR   CORONARY ARTERY DISEASE 10/06/2007   a. s/p PCI to LAD; b. s/p CABG; c. LHC 07/13/12: Proximal LAD stent patent, LIMA-LAD atretic, D1 occluded, proximal circumflex 30%, mid RCA occluded, SVG-D1 normal, SVG-OM2 normal, SVG-distal RCA normal, EF 40% with diffuse HK   Dizziness    History of colonic polyps 10/30/2009   No polyps in 2011. No repeat.     HYPERLIPIDEMIA 10/06/2007   HYPERTENSION 10/06/2007   LBBB 09/06/2008   Left renal mass    MELANOMA 09/06/2008   MOES PROCEDURE RIGHT   NICM (nonischemic cardiomyopathy) ([place])    Echocardiogram 07/12/12: EF 25-30%, diffuse HK, mild AI, mild MR, mild LAE   PERSONAL HX COLONIC POLYPS 10/30/2009   Presence of  permanent cardiac pacemaker    Syncope    UTERINE CANCER, HX OF 09/06/2008   VARICOSE VEIN 09/29/2009    Medications:  Medications Prior to Admission  Medication Sig Dispense Refill Last Dose   acetaminophen (TYLENOL) 500 MG tablet Take 1,000 mg by mouth in the morning and at bedtime.   09/21/2021 at 0800   Cholecalciferol (VITAMIN D3 SUPER STRENGTH) 50 MCG (2000 UT) TABS Take 2,000 Units by mouth daily.   09/21/2021 at 0800   ELIQUIS 5 MG TABS tablet TAKE 1 TABLET(5 MG) BY MOUTH TWICE DAILY (Patient taking differently: Take 5 mg by mouth in the morning and at bedtime.) 60 tablet 5 09/21/2021 at 0800   escitalopram (LEXAPRO) 20 MG tablet TAKE 1 TABLET(20 MG) BY MOUTH DAILY (Patient taking differently: Take 20 mg by mouth in the morning.) 90 tablet 1 09/21/2021 at 0800   lisinopril (ZESTRIL) 5 MG tablet Take 1 tablet (5 mg total) by mouth daily. 30 tablet 0 09/21/2021 at 0800   Melatonin 10 MG TABS Take 10 mg by mouth at bedtime.   09/20/2021 at 2000   memantine (NAMENDA) 5 MG tablet Take 5 mg by mouth in the morning and at bedtime.   09/21/2021 at 0800   pyridostigmine (MESTINON) 60 MG tablet TAKE 1 TABLET(60 MG) BY MOUTH DAILY (Patient taking differently:  Take 60 mg by mouth in the morning.) 90 tablet 2 09/21/2021 at 0800   QUEtiapine (SEROQUEL) 25 MG tablet Take 25 mg by mouth at bedtime.   09/20/2021 at 2000   traMADol (ULTRAM) 50 MG tablet Take 25 mg by mouth in the morning and at bedtime.   09/21/2021 at 0800   haloperidol (HALDOL) 0.5 MG tablet Take 1 tablet (0.5 mg total) by mouth 2 (two) times daily as needed for agitation (separate by at least 6 hours). (Patient not taking: Reported on 09/21/2021) 40 tablet 1 Not Taking   Scheduled:   enoxaparin (LOVENOX) injection  40 mg Subcutaneous Q24H   escitalopram  20 mg Oral Daily   melatonin  5 mg Oral QHS   memantine  5 mg Oral BID   OLANZapine  5 mg Oral QHS   potassium chloride  40 mEq Oral Q4H   pyridostigmine  60 mg Oral Daily     Assessment: 88 yo female here from her NH with syncope. She was on apixaban PTA She is noted with afib and plans are to restart apixaban -hg= 11.1 -Wt ~ 61.6kg  Per cardiology, plans are to use lower dose apixaban (borderline weight and patient noted with history of falls and dementia)  Goal of Therapy:  Monitor platelets by anticoagulation protocol: Yes   Plan:  -apixaban 2.'5mg'$  po bid -Will follow patient progress  Hildred Laser, PharmD Clinical Pharmacist **Pharmacist phone directory can now be found on [email or web address] ([email or web address]).  Listed under [place].

## 2021-09-23 NOTE — Assessment & Plan Note (Addendum)
3 sutures placed to LUE, prolene Needs removal in follow up (placed 6/12) Evaluated for removal on 6/19.  Don't appear ready to come out, will leave for reevaluation of removal in 1-2 days.  Continue nonstick dressing over laceration, wrapped with kerlix.

## 2021-09-23 NOTE — Progress Notes (Signed)
Pt complaining of L chest pain. EKG done, PA Bhagat paged. Pt stated it "hurts a lot" and is "really tender". Pain is tender with palpation. Bhagat suggests it's musculoskeletal and to get her regular pain medicine. Tylenol given. Will follow up with Florene Glen MD if there is no relief with current medication.

## 2021-09-23 NOTE — Assessment & Plan Note (Addendum)
Appreciate cardiology assistance Recommending transitioning to aldactone, replace lytes Will continue to follow QTc (prolonged, V paced) EP consulted -> recommended d/c amio/seroquel, start aldcatone - they thought cause was volume overload, electrolyte abnormalities, and QT prolonging meds Mag/K at goal today Appreciate cardiology recs - start metoprolol, start aldactone. stop haldol and seroquel at d/c. lexapro and zyprexa decreased.  Follow with PCP outpatient or provider at SNF regarding lexapro taper long term as well as eventual taper/dc of zyprexa if able.

## 2021-09-23 NOTE — Assessment & Plan Note (Addendum)
Noted, follow Afebrile with normal wbc count Initial urine cx with klebsiella.  Repeat growing enterococcus. Dose of fosfomycin given prior to d/c. Follow final cx results

## 2021-09-23 NOTE — Assessment & Plan Note (Signed)
Goal >4

## 2021-09-23 NOTE — Progress Notes (Signed)
PROGRESS NOTE    [name] [name]  TGG:[id number] DOB: 05-07-1931 DOA: 09/21/2021 PCP: System, Provider Not In  Chief Complaint  Patient presents with   Seizures    Brief Narrative:  89/F with advanced dementia from memory care unit, history of COPD, CAD with PCI, chronic systolic CHF, orthostatic hypotension who was brought to the ED earlier in the day on 6/12 after [name] unwitnessed fall, she was discharged and presented back to the ED same evening with possible seizure-like activity with loss of consciousness. -In the emergency room patient was noted to be in polymorphic VT, cardiology was consulted and she was started on amiodarone infusion -Overnight EEG without seizure activity    Assessment & Plan:   Principal Problem:   Polymorphic ventricular tachycardia ([place]) Active Problems:   Syncope   Laceration of left upper arm   Hypokalemia   Seizure-like activity (HCC)   Hypomagnesemia   Dementia (HCC)   HFrEF (heart failure with reduced ejection fraction) (HCC)   Paroxysmal atrial fibrillation (HCC)   Asymptomatic bacteriuria   Essential hypertension   CAD (coronary artery disease)   DNR (do not resuscitate)   Assessment and Plan: * Polymorphic ventricular tachycardia ([place]) Appreciate cardiology assistance Recommending transitioning to aldactone, replace lytes Will continue to follow QTc (prolonged, V paced) Will discuss with pharmacy downtitration of lexapro and zyprexa as tolerated  Syncope In setting of torsades, above Hx orthostatic hypotension Follow orthostatics, PT/OT She mentioned LH to me today while she was lying, BP wnl, will follow orthostatics and therapy eval    Laceration of left upper arm 3 sutures placed to LUE, prolene Needs removal in follow up (placed 6/12)  Hypomagnesemia Goal >2  Seizure-like activity (HCC) Due to syncope above, convulsive syncope Appreciate neurology recs EEG without seizures or epileptiform discharges Tramadol  d/c'd  Hypokalemia Goal >4  Dementia ([place]) Reduce lexapro, reduce zyprexa Will discuss with pharmacy [facility] [place] for palliative outpatient  Paroxysmal atrial fibrillation ([place]) Initially was on hold, but after discussion with Dr. Caryl Comes, planning to resume this at reduced dose 2.5 mg BID  HFrEF (heart failure with reduced ejection fraction) ([place]) Appears euvolemic, continue home meds Echo 08/2020 with EF 35-40%  Asymptomatic bacteriuria Noted, follow          DVT prophylaxis: eliquis Code Status: dnr Family Communication: [name] Disposition:   Status is: Inpatient Remains inpatient appropriate because: need for additional w/u, safe d/c plan   Consultants:  Neurology cardiology  Procedures:  none  Antimicrobials:  Anti-infectives (From admission, onward)    Start     Dose/Rate Route Frequency Ordered Stop   09/22/21 2200  cefTRIAXone (ROCEPHIN) 1 g in sodium chloride 0.9 % 100 mL IVPB  Status:  Discontinued        1 g 200 mL/hr over 30 Minutes Intravenous Every 24 hours 09/22/21 0200 09/22/21 1101   09/22/21 0215  cefTRIAXone (ROCEPHIN) 1 g in sodium chloride 0.9 % 100 mL IVPB        1 g 200 mL/hr over 30 Minutes Intravenous  Once 09/22/21 0203 09/22/21 0305       Subjective: C/o some lightheadedness Otherwise no complaints Pleasantly confused  Objective: Vitals:   09/23/21 0523 09/23/21 0802 09/23/21 1159 09/23/21 2002  BP: 129/63 (!) 146/66 (!) 143/68 (!) 155/74  Pulse: 70 70 70 71  Resp:  '18 20 15  '$ Temp: 97.7 F (36.5 C) 98.5 F (36.9 C) 99.3 F (37.4 C) 98.6 F (37 C)  TempSrc: Axillary Axillary Oral Oral  SpO2: 93% 98% 93% 94%    Intake/Output Summary (Last 24 hours) at 09/23/2021 2058 Last data filed at 09/23/2021 1813 Gross per 24 hour  Intake 444.43 ml  Output 125 ml  Net 319.43 ml   There were no vitals filed for this visit.  Examination:  General exam: Appears calm and comfortable  Respiratory system:  unlabored Cardiovascular system: RRR Gastrointestinal system: Abdomen is nondistended, soft and nontender.  Central nervous system: Alert and disoriented . No focal neurological deficits. Extremities: no LEE   Data Reviewed: I have personally reviewed following labs and imaging studies  CBC: Recent Labs  Lab 09/21/21 1432 09/21/21 1931 09/21/21 2001 09/22/21 0441 09/23/21 0103  WBC 7.4 10.7*  --  9.6 6.7  NEUTROABS 6.0 8.1*  --  7.8*  --   HGB 11.3* 12.2 13.6 11.7* 11.1*  HCT 35.8* 40.2 40.0 37.0 34.0*  MCV 97.8 99.0  --  97.4 96.3  PLT 119* 128*  --  112* 101*    Basic Metabolic Panel: Recent Labs  Lab 09/21/21 1432 09/21/21 1931 09/21/21 1955 09/21/21 2001 09/21/21 2324 09/22/21 0441 09/23/21 0103 09/23/21 1330  NA 144 137  --  141  --  140 143 140  K 3.5 3.1*  --  3.1*  --  4.0 3.1* 3.9  CL 107 101  --  104  --  106 104 104  CO2 27 23  --   --   --  '25 29 24  '$ GLUCOSE 113* 182*  --  177*  --  118* 103* 91  BUN 14 13  --  15  --  '11 10 10  '$ CREATININE 0.91 0.97  --  0.90 0.89 0.84 0.83 0.82  CALCIUM 8.8* 8.7*  --   --   --  8.3* 8.2* 8.3*  MG  --   --  1.8  --   --  1.7 2.2  --     GFR: CrCl cannot be calculated (Unknown ideal weight.).  Liver Function Tests: No results for input(s): "AST", "ALT", "ALKPHOS", "BILITOT", "PROT", "ALBUMIN" in the last 168 hours.  CBG: Recent Labs  Lab 09/23/21 0022 09/23/21 0603 09/23/21 0807 09/23/21 1333 09/23/21 2004  GLUCAP 123* 108* 105* 96 132*     Recent Results (from the past 240 hour(s))  SARS Coronavirus 2 by RT PCR (hospital order, performed in [facility] hospital lab) *cepheid single result test* Anterior Nasal Swab     Status: None   Collection Time: 09/21/21  2:39 PM   Specimen: Anterior Nasal Swab  Result Value Ref Range Status   SARS Coronavirus 2 by RT PCR NEGATIVE NEGATIVE Final    Comment: (NOTE) SARS-CoV-2 target nucleic acids are NOT DETECTED.  The SARS-CoV-2 RNA is generally detectable in  upper and lower respiratory specimens during the acute phase of infection. The lowest concentration of SARS-CoV-2 viral copies this assay can detect is 250 copies / mL. [name] [name] negative result does not preclude SARS-CoV-2 infection and should not be used as the sole basis for treatment or other patient management decisions.  [name] [name] negative result may occur with improper specimen collection / handling, submission of specimen other than nasopharyngeal swab, presence of viral mutation(s) within the areas targeted by this assay, and inadequate number of viral copies (<250 copies / mL). [name] [name] negative result must be combined with clinical observations, patient history, and epidemiological information.  Fact Sheet for Patients:   https://www.patel.info/  Fact Sheet for Healthcare Providers: https://hall.com/  This test is not yet approved  or  cleared by the [place] and has been authorized for detection and/or diagnosis of SARS-CoV-2 by FDA under [name] Emergency Use Authorization (EUA).  This EUA will remain in effect (meaning this test can be used) for the duration of the COVID-19 declaration under Section 564(b)(1) of the Act, 21 U.S.C. section 360bbb-3(b)(1), unless the authorization is terminated or revoked sooner.  Performed at [facility], [place] [address] [place], [place] [address]   Urine Culture     Status: Abnormal (Preliminary result)   Collection Time: 10/08/21  2:17 AM   Specimen: Urine, Clean Catch  Result Value Ref Range Status   Specimen Description URINE, CLEAN CATCH  Final   Special Requests   Final    NONE Performed at [facility], [place] [address] [place], [place] [address]    Culture 30,000 COLONIES/mL KLEBSIELLA PNEUMONIAE ([name] [name])  Final   Report Status PENDING  Incomplete         Radiology Studies: EEG adult  Result Date: 2021-10-08 [name] Havens, MD     Oct 08, 2021  8:18 AM Patient Name: [name] [name] MRN:  [id number] Epilepsy Attending: Lora [name] Referring Physician/Provider: Kerney Elbe, MD Date: 09/21/2021 Duration: 21.11 mins Patient history: 88 year old female with [name] [name] history of dementia who presented twice to the ED today, initially for evaluation after [name] unwitnessed fall versus syncope, the second visit after seizure-like activity was witnessed by family while on the way home following the first evaluation. EEG to evaluate for seizure Level of alertness: Awake, asleep AEDs during EEG study: None Technical aspects: This EEG study was done with scalp electrodes positioned according to the 10-20 International system of electrode placement. Electrical activity was acquired at [name] [name] sampling rate of '500Hz'$  and reviewed with [name] [name] high frequency filter of '70Hz'$  and [name] [name] low frequency filter of '1Hz'$ . EEG data were recorded continuously and digitally stored. Description: The posterior dominant rhythm consists of 7 Hz activity of moderate voltage (25-35 uV) seen predominantly in posterior head regions, symmetric and reactive to eye opening and eye closing. Sleep was characterized by vertex waves, sleep spindles (12 to 14 Hz), maximal frontocentral region.  EEG showed continuous generalized 3 to 6 Hz theta-delta slowing. Hyperventilation and photic stimulation were not performed.   ABNORMALITY - Continuous slow, generalized IMPRESSION: This study is suggestive of mild to moderate diffuse encephalopathy, nonspecific etiology. No seizures or epileptiform discharges were seen throughout the recording. [name] [name]        Scheduled Meds:  apixaban  2.5 mg Oral BID   [START ON 09/24/2021] escitalopram  10 mg Oral Daily   melatonin  5 mg Oral QHS   memantine  5 mg Oral BID   OLANZapine  2.5 mg Oral QHS   pyridostigmine  60 mg Oral Daily   spironolactone  12.5 mg Oral Daily   Continuous Infusions:   LOS: 2 days    Time spent: over 30 min    Fayrene Helper, MD [facility]   To contact the attending  provider between 7A-7P or the covering provider during after hours 7P-7A, please log into the web site [email or web address] and access using universal [place] password for that web site. If you do not have the password, please call the hospital operator.  09/23/2021, 8:58 PM

## 2021-09-23 NOTE — Assessment & Plan Note (Addendum)
Due to syncope above, convulsive syncope Appreciate neurology recs EEG without seizures or epileptiform discharges Tramadol d/c'd

## 2021-09-23 NOTE — NC FL2 (Signed)
[place] MEDICAID FL2 LEVEL OF CARE SCREENING TOOL     IDENTIFICATION  Patient Name: [name] [name] Birthdate: Jun 20, 1931 Sex: female Admission Date (Current Location): 09/21/2021  [facility] and [place] Number:  Herbalist and Address:  [facility], [address], [place], [place] [address]      Provider Number: [id number]  Attending Physician Name and Address:  Elodia Florence., *  Relative Name and Phone Number:  [name] (daughter) [phone]    Current Level of Care: Hospital Recommended Level of Care: Memory Care ([facility]) Prior Approval Number:    Date Approved/Denied:   PASRR Number:    Discharge Plan: Other (Comment) Nancy Nordmann at [facility])    Current Diagnoses: Patient Active Problem List   Diagnosis Date Noted   Fall at nursing home, initial encounter 09/21/2021   DNR (do not resuscitate) 09/21/2021   V tach ([place]) 09/21/2021   Closed fracture of right femur, unspecified fracture morphology, initial encounter ([place]) 09/02/2020   Fracture of femoral neck, right ([place]) 09/01/2020   Closed hip fracture, right, initial encounter ([place]) 09/01/2020   Chronic obstructive pulmonary disease ([place]) 03/20/2019   Major depressive disorder with single episode, in partial remission ([place]) 03/20/2019   Arthritis of carpometacarpal ([place]) joint of left thumb 06/02/2017   Current use of long term anticoagulation 06/02/2017   HCAP (healthcare-associated pneumonia) 04/04/2017   Traumatic perinephric hematoma of left kidney 03/29/2017   Acute blood loss as cause of postoperative anemia 03/29/2017   Left renal mass 01/27/2017   Overactive bladder 07/09/2016   Dizziness 05/07/2016   Microscopic hematuria 09/02/2015   CAD (coronary artery disease) 04/17/2014   Allergic rhinitis 04/17/2014   Dementia ([place]) 04/17/2014   Hearing loss 04/17/2014   Pacemaker -CRT-St. Jude 05/21/2013   Atrioventricular block,  complete ([place]) 03/08/2013   Paroxysmal atrial fibrillation ([place]) 02/03/2013   Asthma, intrinsic 02/03/2013   Chronic combined systolic and diastolic congestive heart failure ([place]) 10/10/2012   Depression 09/19/2012   Varicose veins 09/29/2009   History of melanoma 09/06/2008   LBBB (left bundle branch block) 09/06/2008   History of uterine cancer 09/06/2008   Hyperlipidemia 10/06/2007   Essential hypertension 10/06/2007   Ischemic cardiomyopathy  EF 30% cath 4/14 with stent 10/06/2007    Orientation RESPIRATION BLADDER Height & Weight     Self  O2 (1 liter Nasal Cannula) External catheter, Incontinent (External Urinary Catheter) Weight:   Height:     BEHAVIORAL SYMPTOMS/MOOD NEUROLOGICAL BOWEL NUTRITION STATUS      Continent (WDL) Diet (Please see discharge summary)  AMBULATORY STATUS COMMUNICATION OF NEEDS Skin     Verbally Other (Comment) (Appropriate for ethnicity,dry,Abrasion leg,Left,Ecchymosis,arm,bilateral,non-tenting)                       Personal Care Assistance Level of Assistance  Bathing, Feeding, Dressing Bathing Assistance: Maximum assistance Feeding assistance: Limited assistance Dressing Assistance: Maximum assistance     Functional Limitations Info  Sight, Hearing, Speech Sight Info: Adequate (WDL) Hearing Info: Adequate (WDL) Speech Info: Adequate (WDL)    SPECIAL CARE FACTORS FREQUENCY                       Contractures Contractures Info: Not present    Additional Factors Info  Code Status, Allergies, Psychotropic Code Status Info: DNR Allergies Info: Aricept (donepezil),Nitroglycerin Psychotropic Info: escitalopram (LEXAPRO) tablet 20 mg daily,OLANZapine (ZYPREXA) tablet 5 mg daily at bedtime  Current Medications (09/23/2021):  This is the current hospital active medication list Current Facility-Administered Medications  Medication Dose Route Frequency Provider Last Rate Last Admin   acetaminophen (TYLENOL) tablet 650 mg   650 mg Oral Q6H PRN Rise Patience, MD   650 mg at 09/22/21 2059   Or   acetaminophen (TYLENOL) suppository 650 mg  650 mg Rectal Q6H PRN Rise Patience, MD       amiodarone (PACERONE) tablet 400 mg  400 mg Oral BID Chandrasekhar, Mahesh A, MD   400 mg at 09/23/21 0929   [START ON 10/03/2021] amiodarone (PACERONE) tablet 400 mg  400 mg Oral Daily Chandrasekhar, Mahesh A, MD       enoxaparin (LOVENOX) injection 40 mg  40 mg Subcutaneous Q24H Rise Patience, MD   40 mg at 09/22/21 2059   escitalopram (LEXAPRO) tablet 20 mg  20 mg Oral Daily Domenic Polite, MD   20 mg at 09/23/21 0929   hydrALAZINE (APRESOLINE) injection 5 mg  5 mg Intravenous Q4H PRN Rise Patience, MD   5 mg at 09/22/21 0451   melatonin tablet 5 mg  5 mg Oral QHS Domenic Polite, MD   5 mg at 09/22/21 2100   memantine (NAMENDA) tablet 5 mg  5 mg Oral BID Domenic Polite, MD   5 mg at 09/23/21 0929   OLANZapine (ZYPREXA) tablet 5 mg  5 mg Oral QHS Domenic Polite, MD   5 mg at 09/22/21 2100   potassium chloride SA (KLOR-CON M) CR tablet 40 mEq  40 mEq Oral Q4H Elodia Florence., MD   40 mEq at 09/23/21 0929   pyridostigmine (MESTINON) tablet 60 mg  60 mg Oral Daily Domenic Polite, MD   60 mg at 09/23/21 7290     Discharge Medications: Please see discharge summary for a list of discharge medications.  Relevant Imaging Results:  Relevant Lab Results:   Additional Information [phone]  Milas Gain, LCSWA

## 2021-09-24 DIAGNOSIS — I4729 Other ventricular tachycardia: Secondary | ICD-10-CM | POA: Diagnosis not present

## 2021-09-24 LAB — CBC WITH DIFFERENTIAL/PLATELET
Abs Immature Granulocytes: 0.02 10*3/uL (ref 0.00–0.07)
Basophils Absolute: 0 10*3/uL (ref 0.0–0.1)
Basophils Relative: 1 %
Eosinophils Absolute: 0.5 10*3/uL (ref 0.0–0.5)
Eosinophils Relative: 7 %
HCT: 36.5 % (ref 36.0–46.0)
Hemoglobin: 11.7 g/dL — ABNORMAL LOW (ref 12.0–15.0)
Immature Granulocytes: 0 %
Lymphocytes Relative: 18 %
Lymphs Abs: 1.2 10*3/uL (ref 0.7–4.0)
MCH: 31.5 pg (ref 26.0–34.0)
MCHC: 32.1 g/dL (ref 30.0–36.0)
MCV: 98.1 fL (ref 80.0–100.0)
Monocytes Absolute: 0.5 10*3/uL (ref 0.1–1.0)
Monocytes Relative: 8 %
Neutro Abs: 4.5 10*3/uL (ref 1.7–7.7)
Neutrophils Relative %: 66 %
Platelets: 110 10*3/uL — ABNORMAL LOW (ref 150–400)
RBC: 3.72 MIL/uL — ABNORMAL LOW (ref 3.87–5.11)
RDW: 14.9 % (ref 11.5–15.5)
WBC: 6.8 10*3/uL (ref 4.0–10.5)
nRBC: 0 % (ref 0.0–0.2)

## 2021-09-24 LAB — BASIC METABOLIC PANEL
Anion gap: 7 (ref 5–15)
BUN: 12 mg/dL (ref 8–23)
CO2: 27 mmol/L (ref 22–32)
Calcium: 8.6 mg/dL — ABNORMAL LOW (ref 8.9–10.3)
Chloride: 106 mmol/L (ref 98–111)
Creatinine, Ser: 0.99 mg/dL (ref 0.44–1.00)
GFR, Estimated: 55 mL/min — ABNORMAL LOW (ref >60)
Glucose, Bld: 98 mg/dL (ref 70–99)
Potassium: 4.7 mmol/L (ref 3.5–5.1)
Sodium: 140 mmol/L (ref 135–145)

## 2021-09-24 LAB — GLUCOSE, CAPILLARY
Glucose-Capillary: 113 mg/dL — ABNORMAL HIGH (ref 70–99)
Glucose-Capillary: 97 mg/dL (ref 70–99)
Glucose-Capillary: 98 mg/dL (ref 70–99)

## 2021-09-24 LAB — URINE CULTURE: Culture: 30000 — AB

## 2021-09-24 LAB — MAGNESIUM: Magnesium: 1.9 mg/dL (ref 1.7–2.4)

## 2021-09-24 MED ORDER — MAGNESIUM SULFATE IN D5W 1-5 GM/100ML-% IV SOLN
1.0000 g | Freq: Once | INTRAVENOUS | Status: AC
  Administered 2021-09-24: 1 g via INTRAVENOUS
  Filled 2021-09-24: qty 100

## 2021-09-24 MED ORDER — MAGNESIUM OXIDE -MG SUPPLEMENT 400 (240 MG) MG PO TABS: 400.0000 mg | ORAL_TABLET | Freq: Every day | ORAL | Status: DC

## 2021-09-24 NOTE — Progress Notes (Addendum)
Progress Note  Patient Name: [name] [name] Date of Encounter: 09/24/2021  [facility] Cardiologist: Virl Axe, MD   Subjective   She is with significant cognitive impairment, unable offer meaningful history. She states she has no pain, denied SOB. She does not appear in distress.   Inpatient Medications    Scheduled Meds:  apixaban  2.5 mg Oral BID   escitalopram  10 mg Oral Daily   melatonin  5 mg Oral QHS   memantine  5 mg Oral BID   OLANZapine  2.5 mg Oral QHS   pyridostigmine  60 mg Oral Daily   spironolactone  12.5 mg Oral Daily   Continuous Infusions:  magnesium sulfate bolus IVPB     PRN Meds: acetaminophen **OR** acetaminophen, hydrALAZINE   Vital Signs    Vitals:   09/23/21 2002 09/24/21 0101 09/24/21 0434 09/24/21 0715  BP: (!) 155/74 (!) 147/85 (!) 123/94 (!) 159/69  Pulse: 71 69 69 71  Resp: '15 20 20 18  '$ Temp: 98.6 F (37 C) 98.1 F (36.7 C) (!) 96.6 F (35.9 C)   TempSrc: Oral Axillary Axillary   SpO2: 94% 90% 95% 96%  Weight:   59.3 kg     Intake/Output Summary (Last 24 hours) at 09/24/2021 0923 Last data filed at 09/24/2021 0300 Gross per 24 hour  Intake 324.43 ml  Output 350 ml  Net -25.57 ml      09/24/2021    4:34 AM 09/04/2020    2:32 PM 09/02/2020    4:03 PM  Last 3 Weights  Weight (lbs) 130 lb 11.7 oz 129 lb 3 oz 129 lb 3.2 oz  Weight (kg) 59.3 kg 58.6 kg 58.605 kg      Telemetry    V paced rhythm, 8 runs of VT noted on 09/24/21 8:39am - Personally Reviewed  ECG    V paced rhythm,  - Personally Reviewed  Physical Exam   GEN: No acute distress.   Neck: No JVD Cardiac: RRR, no murmurs, rubs, or gallops.  Respiratory: Clear to auscultation bilaterally.on [place] oxygen.  GI: Soft, nontender, non-distended  MS: No leg edema Neuro:  cognitive impaired   Psych: Calm   Labs    High Sensitivity Troponin:   Recent Labs  Lab 09/21/21 1432 09/21/21 1725 09/21/21 1931 09/21/21 2324  TROPONINIHS 31* 25* 33* 40*      Chemistry Recent Labs  Lab 09/22/21 0441 09/23/21 0103 09/23/21 1330 09/24/21 0332  NA 140 143 140 140  K 4.0 3.1* 3.9 4.7  CL 106 104 104 106  CO2 '25 29 24 27  '$ GLUCOSE 118* 103* 91 98  BUN '11 10 10 12  '$ CREATININE 0.84 0.83 0.82 0.99  CALCIUM 8.3* 8.2* 8.3* 8.6*  MG 1.7 2.2  --  1.9  GFRNONAA >60 >60 >60 55*  ANIONGAP '9 10 12 7    '$ Lipids No results for input(s): "CHOL", "TRIG", "HDL", "LABVLDL", "LDLCALC", "CHOLHDL" in the last 168 hours.  Hematology Recent Labs  Lab 09/22/21 0441 09/23/21 0103 09/24/21 0332  WBC 9.6 6.7 6.8  RBC 3.80* 3.53* 3.72*  HGB 11.7* 11.1* 11.7*  HCT 37.0 34.0* 36.5  MCV 97.4 96.3 98.1  MCH 30.8 31.4 31.5  MCHC 31.6 32.6 32.1  RDW 15.1 14.9 14.9  PLT 112* 101* 110*   Thyroid No results for input(s): "TSH", "FREET4" in the last 168 hours.  BNP Recent Labs  Lab 09/21/21 1432  BNP 862.4*    DDimer No results for input(s): "DDIMER" in the last  168 hours.   Radiology    No results found.  Cardiac Studies   Echo from 09/02/20:   1. Left ventricular ejection fraction, by estimation, is 35 to 40%. The  left ventricle has moderately decreased function. The left ventricle  demonstrates global hypokinesis. Left ventricular diastolic parameters are  consistent with Grade II diastolic  dysfunction (pseudonormalization).   2. Right ventricular systolic function is normal. The right ventricular  size is normal. There is moderately elevated pulmonary artery systolic  pressure. The estimated right ventricular systolic pressure is 34.7 mmHg.   3. Left atrial size was moderately dilated.   4. Right atrial size was moderately dilated.   5. The mitral valve is normal in structure. Mild mitral valve  regurgitation. No evidence of mitral stenosis.   6. Tricuspid valve regurgitation is moderate to severe.   7. The aortic valve is normal in structure. There is moderate  calcification of the aortic valve. There is mild thickening of the aortic   valve. Aortic valve regurgitation is mild. Mild to moderate aortic valve  sclerosis/calcification is present, without  any evidence of aortic stenosis.   8. The inferior vena cava is dilated in size with <50% respiratory  variability, suggesting right atrial pressure of 15 mmHg.   Patient Profile     88 y.o. female with PMH of CAD s/p CABG and PCI to LAD, ischemic CM with EF 35-40%, LBBB, orthostatic hypotension, complete heart block s/p CRT-P, A fib, HTN, HLD, COPD, severe dementia, depression,  cardiology is consulted for syncope and polymorphic  VT since 09/21/21.   Assessment & Plan    Syncope  Polymorphic ventricular tachycardia with [place]  NSVT - presented with questionable seizure activity and loss of consciousness 45 sec per chart review  - ED strips showed polymorphic ventricular tachycardia with Torsades, proceeded by intermittent QT lengthen, in the setting of CHF and hypokalemia 3.1 - treated with amiodarone, EP consulted, recommended discontinue amiodarone and Seroquel and start aldactone, felt etiology is due to volume overload and electrolyte imbalance and QT prolonging meds. K 4.7 and Mag 1.9 today, recommend to optimize to keep Mag >2 and K >4   CAD with hx of CABG and LAD PCI  Ischemic CM with EF 35-40% - Last LHC in 2014 which showed severe multivessel disease patent grafts  - Hs trop 31>25>33>40 - EKG paced rhythm  - denied chest pain,  no evidence of ACS, continue conservative approach  Hx of complete heart block -s/p St Jude CRT-PPM, follows EP Dr Caryl Comes    Paroxysmal A fib - s/p CRT-PPM, on eliquis   HTN Orthostatic hypotension - BP elevated, would resume home med lisinopril, added aldactone - monitor BP and consider repeat BMP in a week  Dementia Depression COPD - per hospitalist     For questions or updates, please contact [facility] Please consult [email or web address] for contact info under        Signed, Margie Billet, NP  09/24/2021, 9:23 AM     Personally seen and examined. Agree with APP above with the following comments: One run on monomorphic NSVT seen this AP, no LSL sequence. Mg 1.9, K WNL, Magnesium being repleted. Patient notes that she feels great.  She is an only child.  And she knows that [name] is her daughter.   I have discussed her care with her TRH MD, her primary cardiologist, and attempt to call her daugther today.  Eliquis is reasonable, this has been restarted - K has improved; EP recommends  one more day to evaluate the trajectory of her potassium - EP has still not recommended amiodarone - if JTC is < 350 ms on 09/24/21 OK for discharge. I have asked EP to formally consult  09/25/21.  Rudean Haskell, MD Cardiologist [facility], [address] [place], [place] [address] [phone]  10:50 AM

## 2021-09-24 NOTE — Care Management Important Message (Signed)
Important Message  Patient Details  Name: [name] [name] MRN: [id number] Date of Birth: Sep 21, 1931   Medicare Important Message Given:  Yes     Shelda Altes 09/24/2021, 8:00 AM

## 2021-09-24 NOTE — Consult Note (Addendum)
ELECTROPHYSIOLOGY CONSULT NOTE    Patient ID: [name] [name] MRN: [id number], DOB/AGE: September 13, 1931 88 y.o.  Admit date: 09/21/2021 Date of Consult: 09/25/2021  Primary Physician: System, Provider Not In Primary Cardiologist: Virl Axe, MD  Electrophysiologist: Dr. Caryl Comes  Referring Provider: Dr. Gasper Sells  Patient Profile: [name] [name] is a 88 y.o. female with a history of severe dementia, high grade AV block s/p SJ CRT-P, CAD with h/o LAD PCI, AF on eliquis -> OAC stopped in setting of GOC --> restarted  who is being seen today for the evaluation of Syncope with evidence VT/Torsades at the request of Chandrasekhar.  HPI:  [name] [name] is a 88 y.o. female with medical history as above.   She has severe dementia.  Her daughter is [name] PA with [facility].  She had an unwitnessed fall and was in the ED 6/12 but not admitted. She did have volume overload and was given IV lasix.   After being driven home, she had another syncope with seizure like activity. She did this again in the waiting room and a third time while hooked up to tele.  This time she was noted to have polymorphic VT on tele.  Initially started on IV amiodarone.     Pt is essentially non-verbal and history is obtained from the chart. At her memory unit she gets around with help and usually a walker.  She goes to the dining hall for meals.  She has had some increased dyspnea and wheezing, as well as peripheral edema.     EEG was performed and negative for seizure activity. She was transitioned to po amiodarone and given GOC, conservative management planned to be pursued where possible.   Dr. Caryl Comes had discussed the patient and her care with gen cards and the patients daughter earlier in the week. Given likely PMVT/Torsades, it was felt that we should stop amiodarone as well as her other QT prolonging agents.     Pt was discussed again 6/15 with Dr. Caryl Comes recommending to continue OFF amiodarone, and to  observe at least one more day to observe trajectory of her potassium.  A formal note with recommendations from EP was requested given the complexity of her case.  K 3.1 on arrival 6/12 -> 4.0 6/13 -> 3.1 6/14 -> 3.9 after supp -> 4.7 6/15 -> 4.0 today  Pt is pleasantly demented on exam. Participates in conversation well but unclear how much she understands.   Past Medical History:  Diagnosis Date   Atrial fibrillation ([place]) 10/06/2007   post op   COPD (chronic obstructive pulmonary disease) ([place])    Emphysema on 02/03/13 CXR   CORONARY ARTERY DISEASE 10/06/2007   a. s/p PCI to LAD; b. s/p CABG; c. LHC 07/13/12: Proximal LAD stent patent, LIMA-LAD atretic, D1 occluded, proximal circumflex 30%, mid RCA occluded, SVG-D1 normal, SVG-OM2 normal, SVG-distal RCA normal, EF 40% with diffuse HK   Dizziness    History of colonic polyps 10/30/2009   No polyps in 2011. No repeat.     HYPERLIPIDEMIA 10/06/2007   HYPERTENSION 10/06/2007   LBBB 09/06/2008   Left renal mass    MELANOMA 09/06/2008   MOES PROCEDURE RIGHT   NICM (nonischemic cardiomyopathy) ([place])    Echocardiogram 07/12/12: EF 25-30%, diffuse HK, mild AI, mild MR, mild LAE   PERSONAL HX COLONIC POLYPS 10/30/2009   Presence of permanent cardiac pacemaker    Syncope    UTERINE CANCER, HX OF 09/06/2008   VARICOSE VEIN 09/29/2009  Surgical History:  Past Surgical History:  Procedure Laterality Date   ABDOMINAL HYSTERECTOMY  1988   BI-VENTRICULAR PACEMAKER INSERTION (CRT-P)  02/02/2013   St. Jude, serial no. #[id number]    CARDIAC CATHETERIZATION  08/30/2008   CORONARY ANGIOPLASTY WITH STENT PLACEMENT  2009   CORONARY ARTERY BYPASS GRAFT  2004   IR RADIOLOGIST EVAL & MGMT  02/08/2017   IR RADIOLOGIST EVAL & MGMT  05/26/2017   IR RADIOLOGIST EVAL & MGMT  07/19/2017   IR RADIOLOGIST EVAL & MGMT  05/04/2018   LEFT HEART CATHETERIZATION WITH CORONARY ANGIOGRAM Bilateral 07/13/2012   Procedure: LEFT HEART CATHETERIZATION WITH CORONARY  ANGIOGRAM;  Surgeon: Peter M Martinique, MD;  Location[facility] C[facility] LAB;  Service: Cardiovascular;  Laterality: Bilateral;   OOPHORECTOMY     PERMANENT PACEMAKER INSERTION N/A 02/02/2013   Procedure: PERMANENT PACEMAKER INSERTION;  Surgeon: Evans Lance, MD;  Location: [facility] CATH LAB;  Service: Cardiovascular;  Laterality: N/A;   RADIOLOGY WITH ANESTHESIA Left 03/28/2017   Procedure: RENAL CRYO ABLATION;  Surgeon: Aletta Edouard, MD;  Location: [facility] ORS;  Service: Radiology;  Laterality: Left;   right distal pretibeal     melanoma   TOTAL HIP ARTHROPLASTY Right 09/04/2020   Procedure: ANTERIOR TOTAL HIP ARTHROPLASTY;  Surgeon: Rod Can, MD;  Location: [facility] ORS;  Service: Orthopedics;  Laterality: Right;     Medications Prior to Admission  Medication Sig Dispense Refill Last Dose   acetaminophen (TYLENOL) 500 MG tablet Take 1,000 mg by mouth in the morning and at bedtime.   09/21/2021 at 0800   Cholecalciferol (VITAMIN D3 SUPER STRENGTH) 50 MCG (2000 UT) TABS Take 2,000 Units by mouth daily.   09/21/2021 at 0800   ELIQUIS 5 MG TABS tablet TAKE 1 TABLET(5 MG) BY MOUTH TWICE DAILY (Patient taking differently: Take 5 mg by mouth in the morning and at bedtime.) 60 tablet 5 09/21/2021 at 0800   escitalopram (LEXAPRO) 20 MG tablet TAKE 1 TABLET(20 MG) BY MOUTH DAILY (Patient taking differently: Take 20 mg by mouth in the morning.) 90 tablet 1 09/21/2021 at 0800   lisinopril (ZESTRIL) 5 MG tablet Take 1 tablet (5 mg total) by mouth daily. 30 tablet 0 09/21/2021 at 0800   Melatonin 10 MG TABS Take 10 mg by mouth at bedtime.   09/20/2021 at 2000   memantine (NAMENDA) 5 MG tablet Take 5 mg by mouth in the morning and at bedtime.   09/21/2021 at 0800   pyridostigmine (MESTINON) 60 MG tablet TAKE 1 TABLET(60 MG) BY MOUTH DAILY (Patient taking differently: Take 60 mg by mouth in the morning.) 90 tablet 2 09/21/2021 at 0800   QUEtiapine (SEROQUEL) 25 MG tablet Take 25 mg by mouth at bedtime.   09/20/2021 at 2000   traMADol  (ULTRAM) 50 MG tablet Take 25 mg by mouth in the morning and at bedtime.   09/21/2021 at 0800   haloperidol (HALDOL) 0.5 MG tablet Take 1 tablet (0.5 mg total) by mouth 2 (two) times daily as needed for agitation (separate by at least 6 hours). (Patient not taking: Reported on 09/21/2021) 40 tablet 1 Not Taking    Inpatient Medications:   apixaban  2.5 mg Oral BID   escitalopram  10 mg Oral Daily   melatonin  5 mg Oral QHS   memantine  5 mg Oral BID   OLANZapine  2.5 mg Oral QHS   potassium chloride  10 mEq Oral Daily   pyridostigmine  60 mg Oral Daily   spironolactone  12.5 mg Oral Daily    Allergies:  Allergies  Allergen Reactions   Aricept [Donepezil] Shortness Of Breath and Other (See Comments)    Hallucinations and loss of sensation in legs and "Allergic," per [facility]   Nitroglycerin Other (See Comments)    Oral spray form causes rapid drop in blood pressure. Patient is sensitive to tabs which causes rapid drop in blood pressure. Is ok to use oral spray form. "Allergic," per [facility]    Social History   Socioeconomic History   Marital status: Widowed    Spouse name: Not on file   Number of children: Not on file   Years of education: Not on file   Highest education level: Not on file  Occupational History   Not on file  Tobacco Use   Smoking status: Never   Smokeless tobacco: Never  Vaping Use   Vaping Use: Never used  Substance and Sexual Activity   Alcohol use: Yes    Comment:  OCC gin or wine   Drug use: No   Sexual activity: Not on file  Other Topics Concern   Not on file  Social History Narrative   Widowed. Lives alone with new cat as of 2018. 3 children. 6 grandkids   Daughter  (NP) and son in Sports coach (pharmacist-teaches at [place]) that live close and help out.       Drives in the daytime, shops for herself, cleaning-has someone over to clean due to the intermittent lightheadedness, bathing      Retired from physical education.       Social Determinants of Health    Financial Resource Strain: Not on file  Food Insecurity: Not on file  Transportation Needs: Not on file  Physical Activity: Not on file  Stress: No Stress Concern Present (04/04/2017)   [place]    Feeling of Stress : Not at all  Social Connections: Not on file  Intimate Partner Violence: Not on file     Family History  Problem Relation Age of Onset   Stroke Mother    Heart attack Father    Colon cancer Neg Hx      Review of Systems: All other systems reviewed and are otherwise negative except as noted above.  Physical Exam: Vitals:   09/25/21 0423 09/25/21 0500 09/25/21 0651 09/25/21 1233  BP: (!) 168/81  (!) 166/87 (!) 157/68  Pulse: 70   70  Resp:    19  Temp: 98.6 F (37 C)   98.6 F (37 C)  TempSrc: Oral   Oral  SpO2: 95%   96%  Weight:  60.1 kg      GEN- The patient is well appearing, alert and oriented x 3 today.   HEENT: normocephalic, atraumatic; sclera clear, conjunctiva pink; hearing intact; oropharynx clear; neck supple Lungs- Clear to ausculation bilaterally, normal work of breathing.  No wheezes, rales, rhonchi Heart- Regular rate and rhythm, no murmurs, rubs or gallops GI- soft, non-tender, non-distended, bowel sounds present Extremities- no clubbing, cyanosis, or edema; DP/PT/radial pulses 2+ bilaterally MS- no significant deformity or atrophy Skin- warm and dry, no rash or lesion Psych- euthymic mood, full affect Neuro- strength and sensation are intact  Labs:   Lab Results  Component Value Date   WBC 6.8 09/25/2021   HGB 12.4 09/25/2021   HCT 38.2 09/25/2021   MCV 95.3 09/25/2021   PLT 124 (L) 09/25/2021    Recent Labs  Lab 09/25/21 0247  NA  139  K 4.0  CL 104  CO2 26  BUN 12  CREATININE 0.90  CALCIUM 8.8*  GLUCOSE 97      Radiology/Studies: EEG adult  Result Date: 10-19-2021 Lora Havens, MD     Oct 19, 2021  8:18 AM Patient Name: Monisha Siebel MRN:  [id number] Epilepsy Attending: Lora Havens Referring Physician/Provider: Kerney Elbe, MD Date: 09/21/2021 Duration: 21.11 mins Patient history: 88 year old female with a history of dementia who presented twice to the ED today, initially for evaluation after an unwitnessed fall versus syncope, the second visit after seizure-like activity was witnessed by family while on the way home following the first evaluation. EEG to evaluate for seizure Level of alertness: Awake, asleep AEDs during EEG study: None Technical aspects: This EEG study was done with scalp electrodes positioned according to the 10-20 International system of electrode placement. Electrical activity was acquired at a sampling rate of _0  and reviewed with a high frequency filter of _1  and a low frequency filter of _2 . EEG data were recorded continuously and digitally stored. Description: The posterior dominant rhythm consists of 7 Hz activity of moderate voltage (25-35 uV) seen predominantly in posterior head regions, symmetric and reactive to eye opening and eye closing. Sleep was characterized by vertex waves, sleep spindles (12 to 14 Hz), maximal frontocentral region.  EEG showed continuous generalized 3 to 6 Hz theta-delta slowing. Hyperventilation and photic stimulation were not performed.   ABNORMALITY - Continuous slow, generalized IMPRESSION: This study is suggestive of mild to moderate diffuse encephalopathy, nonspecific etiology. No seizures or epileptiform discharges were seen throughout the recording. Lora Havens   DG Chest Port 1 View  Result Date: 09/21/2021 CLINICAL DATA:  Chest pain EXAM: PORTABLE CHEST 1 VIEW COMPARISON:  09/21/2021 FINDINGS: Cardiomegaly, vascular congestion. Interstitial prominence may reflect interstitial edema. No effusions or acute bony abnormality. Prior CABG.  Left pacer remains in place, unchanged. IMPRESSION: Cardiomegaly with vascular congestion. Possible early interstitial edema.  Electronically Signed   By: Rolm Baptise M.D.   On: 09/21/2021 20:58   CT Head Wo Contrast  Result Date: 09/21/2021 CLINICAL DATA:  Headache, trauma EXAM: CT HEAD WITHOUT CONTRAST CT CERVICAL SPINE WITHOUT CONTRAST TECHNIQUE: Multidetector CT imaging of the head and cervical spine was performed following the standard protocol without intravenous contrast. Multiplanar CT image reconstructions of the cervical spine were also generated. RADIATION DOSE REDUCTION: This exam was performed according to the departmental dose-optimization program which includes automated exposure control, adjustment of the mA and/or kV according to patient size and/or use of iterative reconstruction technique. COMPARISON:  CT head and cervical spine 09/01/2020 FINDINGS: CT HEAD FINDINGS Brain: There is no evidence of acute intracranial hemorrhage, extra-axial fluid collection, or acute infarct. There is mild-to-moderate global parenchymal volume loss with prominence of the ventricular system and extra-axial CSF spaces, not significantly changed since the prior study from 2022. Patchy and confluent hypodensity throughout the subcortical and periventricular white matter likely reflects sequela of advanced chronic white matter microangiopathy. A probable small calcified meningioma overlying the right frontal lobe is unchanged. There is no other solid mass lesion. There is no mass effect or midline shift. Vascular: There is calcification of the bilateral cavernous ICAs. Skull: Normal. Negative for fracture or focal lesion. Sinuses/Orbits: The imaged paranasal sinuses are clear. Bilateral lens implants are in place. The globes and orbits are otherwise unremarkable. Other: None. CT CERVICAL SPINE FINDINGS Alignment: There is slight reversal of the normal cervical lordosis centered at C5, unchanged. There is  no antero or retrolisthesis. There is no jumped or perched facets or other evidence of traumatic malalignment. Skull base and vertebrae:  Skull base alignment is maintained. Vertebral body heights are preserved. There is no evidence of acute fracture. Ankylosis of the facet joints at C2 through C4 is unchanged. Soft tissues and spinal canal: No prevertebral fluid or swelling. No visible canal hematoma. Disc levels: Multilevel disc space narrowing and degenerative endplate changes again seen, most advanced at C4-C5 and C5-C6. Multilevel facet arthropathy is also similar to the prior study. There is no evidence of high-grade spinal canal stenosis. Upper chest: The imaged lung apices are clear. Other: None. IMPRESSION: 1. No acute intracranial pathology. 2. No acute fracture or traumatic malalignment of the cervical spine. Electronically Signed   By: [name] M.D.   On: 09/21/2021 14:14   CT Cervical Spine Wo Contrast  Result Date: 09/21/2021 CLINICAL DATA:  Headache, trauma EXAM: CT HEAD WITHOUT CONTRAST CT CERVICAL SPINE WITHOUT CONTRAST TECHNIQUE: Multidetector CT imaging of the head and cervical spine was performed following the standard protocol without intravenous contrast. Multiplanar CT image reconstructions of the cervical spine were also generated. RADIATION DOSE REDUCTION: This exam was performed according to the departmental dose-optimization program which includes automated exposure control, adjustment of the mA and/or kV according to patient size and/or use of iterative reconstruction technique. COMPARISON:  CT head and cervical spine 09/01/2020 FINDINGS: CT HEAD FINDINGS Brain: There is no evidence of acute intracranial hemorrhage, extra-axial fluid collection, or acute infarct. There is mild-to-moderate global parenchymal volume loss with prominence of the ventricular system and extra-axial CSF spaces, not significantly changed since the prior study from 2022. Patchy and confluent hypodensity throughout the subcortical and periventricular white matter likely reflects sequela of advanced chronic white matter microangiopathy. A  probable small calcified meningioma overlying the right frontal lobe is unchanged. There is no other solid mass lesion. There is no mass effect or midline shift. Vascular: There is calcification of the bilateral cavernous ICAs. Skull: Normal. Negative for fracture or focal lesion. Sinuses/Orbits: The imaged paranasal sinuses are clear. Bilateral lens implants are in place. The globes and orbits are otherwise unremarkable. Other: None. CT CERVICAL SPINE FINDINGS Alignment: There is slight reversal of the normal cervical lordosis centered at C5, unchanged. There is no antero or retrolisthesis. There is no jumped or perched facets or other evidence of traumatic malalignment. Skull base and vertebrae: Skull base alignment is maintained. Vertebral body heights are preserved. There is no evidence of acute fracture. Ankylosis of the facet joints at C2 through C4 is unchanged. Soft tissues and spinal canal: No prevertebral fluid or swelling. No visible canal hematoma. Disc levels: Multilevel disc space narrowing and degenerative endplate changes again seen, most advanced at C4-C5 and C5-C6. Multilevel facet arthropathy is also similar to the prior study. There is no evidence of high-grade spinal canal stenosis. Upper chest: The imaged lung apices are clear. Other: None. IMPRESSION: 1. No acute intracranial pathology. 2. No acute fracture or traumatic malalignment of the cervical spine. Electronically Signed   By: [name] M.D.   On: 09/21/2021 14:14   DG Shoulder Left  Result Date: 09/21/2021 CLINICAL DATA:  Status post fall, shoulder pain EXAM: LEFT SHOULDER - 2+ VIEW COMPARISON:  None Available. FINDINGS: There is no evidence of fracture or dislocation. There is no evidence of arthropathy or other focal bone abnormality. Soft tissues are unremarkable. IMPRESSION: Negative. Electronically Signed   By: Kathreen Devoid M.D.   On: 09/21/2021 13:51  DG Chest 1 View  Result Date: 09/21/2021 CLINICAL DATA:  Status post  fall.  History of AFib. EXAM: CHEST  1 VIEW COMPARISON:  09/01/2020 FINDINGS: Bilateral mild interstitial thickening. No focal consolidation. No pleural effusion or pneumothorax. Enlarged central pulmonary vasculature. Stable cardiomegaly. Prior CABG. Dual lead cardiac pacemaker. No acute osseous abnormality. IMPRESSION: 1. Cardiomegaly with mild pulmonary vascular congestion. Electronically Signed   By: Kathreen Devoid M.D.   On: 09/21/2021 13:50   DG Pelvis 1-2 Views  Result Date: 09/21/2021 CLINICAL DATA:  Status post fall. EXAM: PELVIS - 1-2 VIEW COMPARISON:  None Available. FINDINGS: Generalized osteopenia. No acute fracture or dislocation. Right hip arthroplasty. No aggressive osseous lesion. Normal alignment. Soft tissue are unremarkable. No radiopaque foreign body or soft tissue emphysema. IMPRESSION: 1. No acute osseous injury of the pelvis. Electronically Signed   By: Kathreen Devoid M.D.   On: 09/21/2021 13:49    EKG: on arrival shows AF/AFL with V pacing at 70 and QT 490/530 (personally reviewed)  TELEMETRY: V pacing 70s with underlying AF/AFL (personally reviewed)  DEVICE HISTORY: St Jude CRT-P implanted 01/2015 for advanced AV block  Assessment/Plan:  Syncope  2. Polymorphic ventricular tachycardia with Torsades  3. NSVT Presented with questionable seizure activity and loss of consciousness 45 sec per chart review  ED strips showed polymorphic ventricular tachycardia with Torsades, proceeded by intermittent QT lengthen, in the setting of CHF and hypokalemia 3.1 and multiple QT prolonging agents.  Initially treated with amiodarone, Dr. Caryl Comes reviewed and recommending stopping amiodarone, seroquel, and haldol, and following QT and K curve.  Keep K > 4.0 and Mg > 2.0    4. CAD with hx of CABG and LAD PCI  Ischemic CM with EF 35-40% Last LHC in 2014 which showed severe multivessel disease patent grafts  Hs trop 31>25>33>40 No complaints of chest pain. Conservative management per [place]    Hx of complete heart block -s/p St Jude CRT-PPM, follows EP Dr Caryl Comes     Paroxysmal A fib - s/p CRT-PPM, on eliquis    HTN Orthostatic hypotension - BP elevated, would resume home med lisinopril, added aldactone - monitor BP and consider repeat BMP in a week   Dementia Depression COPD - per hospitalist   Reviewed labs and EKG with Dr. Caryl Comes via phone. With K 4.7 to 4.0 we would start on potassium 10 meq daily and follow trend for tomorrow. If remains stable OK for discharge, and will need BMET in 1 week.   JT interval ~ 340 so OK. Avoid QT prolonging agents.   Dr. Lovena Le to see. EP to see as needed while remains here.   For questions or updates, please contact [place] Please consult [email or web address] for contact info under Cardiology/STEMI.  Jacalyn Lefevre, PA-C  09/25/2021 3:41 PM  EP Attending  Patient seen and examined. Agree with above. Discussed with Dr. Suzanne Boron and Dr. Renaldo Reel. The patient has had PMVT in the setting of QT prolonging drugs and hypokalemia. Her amiodarone has been stopped. Her VT is much improved. In light of her advanced dementia, I would recommend conservative therapy with continued active repletion of her potassium and continued aldactone. Not sure why she is not on a beta blocker but a low dose of toprol 25 mg daily. I did not see an allergy or other issue with this medication. Avoid all QT prolonging drugs. She can followup with Dr. Gerhard Munch Lassie Demorest,MD

## 2021-09-24 NOTE — Evaluation (Signed)
Physical Therapy Evaluation Patient Details Name: [name] [name] MRN: [id number] DOB: 05-Sep-1931 Today's Date: 09/24/2021  History of Present Illness  88 yo presented to ED 6/12 from [place] at [facility] memory care with fall and was D/Cd with return same day with LOC and polymorphic VT. PMhx:advanced dementia, COPD, CAD with PCI, chronic systolic CHF, orthostatic hypotension, Afib  Clinical Impression  Pt pleasantly confused trying to read medicare paper and asking about a cat. Pt able to follow gestural cues with increased accuracy and requires physical assist for transfers and limited gait with posterior lean. Unable to perform increased ambulation due to cognition and deficits but per chart pt was walking to dining hall at memory care. Pt with decreased cognition, strength and balance will trial acute therapy to maximize function and mobility with recommendation for return to facility if they can provide physical assist with transfers vs SNF.  HR [phone]       Recommendations for follow up therapy are one component of a multi-disciplinary discharge planning process, led by the attending physician.  Recommendations may be updated based on patient status, additional functional criteria and insurance authorization.  Follow Up Recommendations Long-term institutional care without follow-up therapy (return to memory care if able to provide physical assist with transfers otherwise SNF)    Assistance Recommended at Discharge Frequent or constant Supervision/Assistance  Patient can return home with the following  A lot of help with walking and/or transfers;A lot of help with bathing/dressing/bathroom;Assistance with feeding;Assist for transportation;Assistance with cooking/housework    Equipment Recommendations None recommended by PT  Recommendations for Other Services       Functional Status Assessment Patient has had a recent decline in their functional status and/or demonstrates limited ability to  make significant improvements in function in a reasonable and predictable amount of time     Precautions / Restrictions Precautions Precautions: Fall Restrictions Weight Bearing Restrictions: No      Mobility  Bed Mobility Overal bed mobility: Needs Assistance Bed Mobility: Supine to Sit     Supine to sit: HOB elevated, Min assist     General bed mobility comments: min assist with HOB 30 degrees and increased time to rise from supine to sit    Transfers Overall transfer level: Needs assistance   Transfers: Sit to/from Stand Sit to Stand: Min assist           General transfer comment: min assist to rise from bed x 2 trials    Ambulation/Gait Ambulation/Gait assistance: Mod assist Gait Distance (Feet): 10 Feet Assistive device: Rolling walker (2 wheels) Gait Pattern/deviations: Step-through pattern, Decreased stride length, Trunk flexed, Wide base of support   Gait velocity interpretation: <1.31 ft/sec, indicative of household ambulator   General Gait Details: pt with self posterior to RW, wide BOS, flexed trunk and assist to advance RW and mod assist to control balance and advancing gait  Stairs            Wheelchair Mobility    Modified Rankin (Stroke Patients Only)       Balance Overall balance assessment: Needs assistance Sitting-balance support: No upper extremity supported, Feet supported Sitting balance-Leahy Scale: Poor Sitting balance - Comments: guarding with sitting with several periods of posterior lean requiring assist to return to midline   Standing balance support: Bilateral upper extremity supported Standing balance-Leahy Scale: Poor Standing balance comment: RW and physical assist to maintain standing  Pertinent Vitals/Pain Pain Assessment Pain Assessment: No/denies pain    Home Living Family/patient expects to be discharged to:: Skilled nursing facility                    Additional Comments: pt was at memory care and walking to dining hall per chart. Pt unable to provide PLOF and family not present    Prior Function Prior Level of Function : Needs assist  Cognitive Assist : Mobility (cognitive);ADLs (cognitive)                   Hand Dominance        Extremity/Trunk Assessment   Upper Extremity Assessment Upper Extremity Assessment: Generalized weakness    Lower Extremity Assessment Lower Extremity Assessment: Generalized weakness    Cervical / Trunk Assessment Cervical / Trunk Assessment: Kyphotic  Communication   Communication: HOH  Cognition Arousal/Alertness: Awake/alert Behavior During Therapy: Flat affect Overall Cognitive Status: History of cognitive impairments - at baseline                                 General Comments: baseline dementia, no family present to confirm current presentation        General Comments      Exercises     Assessment/Plan    PT Assessment Patient needs continued PT services  PT Problem List Decreased strength;Decreased mobility;Decreased safety awareness;Decreased activity tolerance;Decreased cognition;Decreased balance;Decreased knowledge of use of DME       PT Treatment Interventions Gait training;Functional mobility training;Therapeutic activities;Cognitive remediation;DME instruction    PT Goals (Current goals can be found in the Care Plan section)  Acute Rehab PT Goals PT Goal Formulation: Patient unable to participate in goal setting Time For Goal Achievement: 10/08/21 Potential to Achieve Goals: Poor    Frequency Min 1X/week     Co-evaluation               AM-PAC PT "6 Clicks" Mobility  Outcome Measure Help needed turning from your back to your side while in a flat bed without using bedrails?: A Little Help needed moving from lying on your back to sitting on the side of a flat bed without using bedrails?: A Little Help needed moving to and from a  bed to a chair (including a wheelchair)?: A Lot Help needed standing up from a chair using your arms (e.g., wheelchair or bedside chair)?: A Little Help needed to walk in hospital room?: A Lot Help needed climbing 3-5 steps with a railing? : Total 6 Click Score: 14    End of Session Equipment Utilized During Treatment: Gait belt Activity Tolerance: Patient tolerated treatment well Patient left: in chair;with call bell/phone within reach;with chair alarm set Nurse Communication: Mobility status PT Visit Diagnosis: Other abnormalities of gait and mobility (R26.89);Muscle weakness (generalized) (M62.81);Difficulty in walking, not elsewhere classified (R26.2)    Time: 6389-3734 PT Time Calculation (min) (ACUTE ONLY): 16 min   Charges:   PT Evaluation $PT Eval Moderate Complexity: 1 Mod          Lilyian Quayle P, PT Acute Rehabilitation Services Office: [phone]   Sandy Salaam Laquitha Heslin 09/24/2021, 11:46 AM

## 2021-09-24 NOTE — Progress Notes (Signed)
PROGRESS NOTE    [name] [name]  EHU:[id number] DOB: February 10, 1932 DOA: 09/21/2021 PCP: System, Provider Not In  Chief Complaint  Patient presents with   Seizures    Brief Narrative:  89/F with advanced dementia from memory care unit, history of COPD, CAD with PCI, chronic systolic CHF, orthostatic hypotension who was brought to the ED earlier in the day on 6/12 after an unwitnessed fall, she was discharged and presented back to the ED same evening with possible seizure-like activity with loss of consciousness. -In the emergency room patient was noted to be in polymorphic VT, cardiology was consulted and she was started on amiodarone infusion -Overnight EEG without seizure activity    Assessment & Plan:   Principal Problem:   Polymorphic ventricular tachycardia (HCC) Active Problems:   Syncope   Laceration of left upper arm   Hypokalemia   Seizure-like activity (HCC)   Hypomagnesemia   Dementia (HCC)   HFrEF (heart failure with reduced ejection fraction) (HCC)   Paroxysmal atrial fibrillation ([place])   Asymptomatic bacteriuria   Essential hypertension   CAD (coronary artery disease)   DNR (do not resuscitate)   Assessment and Plan: * Polymorphic ventricular tachycardia ([place]) Appreciate cardiology assistance Recommending transitioning to aldactone, replace lytes Will continue to follow QTc (prolonged, V paced) EP consulted -> recommended d/c amio/seroquel, start aldcatone - they thought cause was volume overload, electrolyte abnormalities, and QT prolonging meds lexapro and zyprexa decreased  Syncope In setting of torsades, above Hx orthostatic hypotension Follow orthostatics, PT/OT She mentioned LH to me yesterday, unclear today, will follow orthostatics and therapy eval    Laceration of left upper arm 3 sutures placed to LUE, prolene Needs removal in follow up (placed 6/12)  Hypomagnesemia Goal >2  Seizure-like activity ([place]) Due to syncope above, convulsive  syncope Appreciate neurology recs EEG without seizures or epileptiform discharges Tramadol d/c'd  Hypokalemia Goal >4  Dementia ([place]) Reduce lexapro, reduce zyprexa discussed with pharmacy [facility] [place] for palliative outpatient  Paroxysmal atrial fibrillation ([place]) Initially was on hold, but after discussion with Dr. Caryl Comes, planning to resume this at reduced dose 2.5 mg BID  HFrEF (heart failure with reduced ejection fraction) ([place]) Appears euvolemic, continue home meds Echo 08/2020 with EF 35-40%  Asymptomatic bacteriuria Noted, follow      DVT prophylaxis: eliquis Code Status: dnr Family Communication: [name] 6/14 Disposition:   Status is: Inpatient Remains inpatient appropriate because: need for additional w/u, safe d/c plan   Consultants:  Neurology cardiology  Procedures:  none  Antimicrobials:  Anti-infectives (From admission, onward)    Start     Dose/Rate Route Frequency Ordered Stop   09/22/21 2200  cefTRIAXone (ROCEPHIN) 1 g in sodium chloride 0.9 % 100 mL IVPB  Status:  Discontinued        1 g 200 mL/hr over 30 Minutes Intravenous Every 24 hours 09/22/21 0200 09/22/21 1101   09/22/21 0215  cefTRIAXone (ROCEPHIN) 1 g in sodium chloride 0.9 % 100 mL IVPB        1 g 200 mL/hr over 30 Minutes Intravenous  Once 09/22/21 0203 09/22/21 0305       Subjective: No complaints, but more confused today  Objective: Vitals:   09/24/21 0434 09/24/21 0715 09/24/21 1140 09/24/21 1300  BP: (!) 123/94 (!) 159/69  (!) 174/76  Pulse: 69 71 84 70  Resp: '20 18  20  '$ Temp: (!) 96.6 F (35.9 C)     TempSrc: Axillary     SpO2: 95% 96%  Weight: 59.3 kg       Intake/Output Summary (Last 24 hours) at 09/24/2021 1915 Last data filed at 09/24/2021 1131 Gross per 24 hour  Intake 336.56 ml  Output 225 ml  Net 111.56 ml   Filed Weights   09/24/21 0434  Weight: 59.3 kg    Examination:  General: No acute distress. Cardiovascular: RRR Lungs:  unlabored Abdomen: Soft, nontender, nondistended Neurological: more disoriented today, moving all extremities Extremities: no LEE    Data Reviewed: I have personally reviewed following labs and imaging studies  CBC: Recent Labs  Lab 09/21/21 1432 09/21/21 1931 09/21/21 2001 09/22/21 0441 09/23/21 0103 09/24/21 0332  WBC 7.4 10.7*  --  9.6 6.7 6.8  NEUTROABS 6.0 8.1*  --  7.8*  --  4.5  HGB 11.3* 12.2 13.6 11.7* 11.1* 11.7*  HCT 35.8* 40.2 40.0 37.0 34.0* 36.5  MCV 97.8 99.0  --  97.4 96.3 98.1  PLT 119* 128*  --  112* 101* 110*    Basic Metabolic Panel: Recent Labs  Lab 09/21/21 1931 09/21/21 1955 09/21/21 2001 09/21/21 2324 09/22/21 0441 09/23/21 0103 09/23/21 1330 09/24/21 0332  NA 137  --  141  --  140 143 140 140  K 3.1*  --  3.1*  --  4.0 3.1* 3.9 4.7  CL 101  --  104  --  106 104 104 106  CO2 23  --   --   --  '25 29 24 27  '$ GLUCOSE 182*  --  177*  --  118* 103* 91 98  BUN 13  --  15  --  '11 10 10 12  '$ CREATININE 0.97  --  0.90 0.89 0.84 0.83 0.82 0.99  CALCIUM 8.7*  --   --   --  8.3* 8.2* 8.3* 8.6*  MG  --  1.8  --   --  1.7 2.2  --  1.9    GFR: CrCl cannot be calculated (Unknown ideal weight.).  Liver Function Tests: No results for input(s): "AST", "ALT", "ALKPHOS", "BILITOT", "PROT", "ALBUMIN" in the last 168 hours.  CBG: Recent Labs  Lab 09/23/21 1333 09/23/21 2004 09/24/21 0204 09/24/21 0727 09/24/21 1409  GLUCAP 96 132* 113* 97 98     Recent Results (from the past 240 hour(s))  SARS Coronavirus 2 by RT PCR (hospital order, performed in [facility] hospital lab) *cepheid single result test* Anterior Nasal Swab     Status: None   Collection Time: 09/21/21  2:39 PM   Specimen: Anterior Nasal Swab  Result Value Ref Range Status   SARS Coronavirus 2 by RT PCR NEGATIVE NEGATIVE Final    Comment: (NOTE) SARS-CoV-2 target nucleic acids are NOT DETECTED.  The SARS-CoV-2 RNA is generally detectable in upper and lower respiratory specimens  during the acute phase of infection. The lowest concentration of SARS-CoV-2 viral copies this assay can detect is 250 copies / mL. Chloe Flis negative result does not preclude SARS-CoV-2 infection and should not be used as the sole basis for treatment or other patient management decisions.  Vera Furniss negative result may occur with improper specimen collection / handling, submission of specimen other than nasopharyngeal swab, presence of viral mutation(s) within the areas targeted by this assay, and inadequate number of viral copies (<250 copies / mL). Jasmarie Coppock negative result must be combined with clinical observations, patient history, and epidemiological information.  Fact Sheet for Patients:   https://www.patel.info/  Fact Sheet for Healthcare Providers: https://hall.com/  This test is not yet approved or  cleared by the [place] and has been authorized for detection and/or diagnosis of SARS-CoV-2 by FDA under an Emergency Use Authorization (EUA).  This EUA will remain in effect (meaning this test can be used) for the duration of the COVID-19 declaration under Section 564(b)(1) of the Act, 21 U.S.C. section 360bbb-3(b)(1), unless the authorization is terminated or revoked sooner.  Performed at [facility], [place] [address], [place], [place] [address]   Urine Culture     Status: Abnormal   Collection Time: 09/22/21  2:17 AM   Specimen: Urine, Clean Catch  Result Value Ref Range Status   Specimen Description URINE, CLEAN CATCH  Final   Special Requests   Final    NONE Performed at [facility], [place] [address] [place], [place] [address]    Culture 30,000 COLONIES/mL KLEBSIELLA PNEUMONIAE (Binnie Vonderhaar)  Final   Report Status 09/24/2021 FINAL  Final   Organism ID, Bacteria KLEBSIELLA PNEUMONIAE (Tyriq Moragne)  Final      Susceptibility   Klebsiella pneumoniae - MIC*    AMPICILLIN RESISTANT Resistant     CEFAZOLIN <=4 SENSITIVE Sensitive     CEFEPIME <=0.12  SENSITIVE Sensitive     CEFTRIAXONE <=0.25 SENSITIVE Sensitive     CIPROFLOXACIN <=0.25 SENSITIVE Sensitive     GENTAMICIN <=1 SENSITIVE Sensitive     IMIPENEM 0.5 SENSITIVE Sensitive     NITROFURANTOIN 64 INTERMEDIATE Intermediate     TRIMETH/SULFA <=20 SENSITIVE Sensitive     AMPICILLIN/SULBACTAM <=2 SENSITIVE Sensitive     PIP/TAZO <=4 SENSITIVE Sensitive     * 30,000 COLONIES/mL KLEBSIELLA PNEUMONIAE         Radiology Studies: No results found.      Scheduled Meds:  apixaban  2.5 mg Oral BID   escitalopram  10 mg Oral Daily   melatonin  5 mg Oral QHS   memantine  5 mg Oral BID   OLANZapine  2.5 mg Oral QHS   pyridostigmine  60 mg Oral Daily   spironolactone  12.5 mg Oral Daily   Continuous Infusions:   LOS: 3 days    Time spent: over 30 min    Fayrene Helper, MD [facility]   To contact the attending provider between 7A-7P or the covering provider during after hours 7P-7A, please log into the web site [email or web address] and access using universal [place] password for that web site. If you do not have the password, please call the hospital operator.  09/24/2021, 7:15 PM

## 2021-09-24 NOTE — TOC Progression Note (Addendum)
Transition of Care [facility]) - Progression Note    Patient Details  Name: Attallah Ontko MRN: [id number] Date of Birth: 1931/09/27  Transition of Care [facility]) CM/SW [place], [place] Phone Number: 09/24/2021, 3:38 PM  Clinical Narrative:      Update- CSW received callback from [facility] with PT. Elta Guadeloupe confirmed plan is for PT to see patient in the morning for a reevaluation.  CSW spoke with Altamese Dilling with [facility] Memory care. Altamese Dilling requested PT therapy notes for patient. CSW sent over Pt note for review. Patients daughter and facility requesting for PT to come back and reevaluate patient in the morning . Patients daughter would hope that she can return back to memory care with PT orders but request another assessment to confirm since patient is off her baseline from two days ago. CSW informed MD. CSW requested another PT eval to see if plan is for memory care with [facility] orders vrs. SNF. Patients daughter gave CSW permission to fax out initial referral near the [place] area for SNF as dc back up plan if memory care with hh orders not recommended. CSW will continue to follow and assist with patients dc planning needs.    Expected Discharge Plan and Services                                                 Social Determinants of Health (SDOH) Interventions    Readmission Risk Interventions     No data to display

## 2021-09-24 NOTE — Progress Notes (Addendum)
Mobility Specialist Progress Note:   09/24/21 1802  Mobility  Activity Ambulated with assistance in hallway  Level of Assistance Moderate assist, patient does 50-74%  Assistive Device Front wheel walker  Distance Ambulated (ft) 50 ft  Activity Response Tolerated fair  $Mobility charge 1 Mobility   Pt received in chair willing to participate in mobility. No complaints of pain. Pt hard to orient but was modA to stand. Required max cues to stay closer to walker and needed help steering. When getting back to room pt needed assistance getting into bed requiring max verbal cues for sequencing. Left in bed with call bell in reach and all needs met.     Mobility Specialist  

## 2021-09-25 DIAGNOSIS — I4729 Other ventricular tachycardia: Secondary | ICD-10-CM | POA: Diagnosis not present

## 2021-09-25 LAB — CBC WITH DIFFERENTIAL/PLATELET
Abs Immature Granulocytes: 0.02 10*3/uL (ref 0.00–0.07)
Basophils Absolute: 0 10*3/uL (ref 0.0–0.1)
Basophils Relative: 0 %
Eosinophils Absolute: 0.5 10*3/uL (ref 0.0–0.5)
Eosinophils Relative: 7 %
HCT: 38.2 % (ref 36.0–46.0)
Hemoglobin: 12.4 g/dL (ref 12.0–15.0)
Immature Granulocytes: 0 %
Lymphocytes Relative: 17 %
Lymphs Abs: 1.2 10*3/uL (ref 0.7–4.0)
MCH: 30.9 pg (ref 26.0–34.0)
MCHC: 32.5 g/dL (ref 30.0–36.0)
MCV: 95.3 fL (ref 80.0–100.0)
Monocytes Absolute: 0.5 10*3/uL (ref 0.1–1.0)
Monocytes Relative: 8 %
Neutro Abs: 4.6 10*3/uL (ref 1.7–7.7)
Neutrophils Relative %: 68 %
Platelets: 124 10*3/uL — ABNORMAL LOW (ref 150–400)
RBC: 4.01 MIL/uL (ref 3.87–5.11)
RDW: 14.5 % (ref 11.5–15.5)
WBC: 6.8 10*3/uL (ref 4.0–10.5)
nRBC: 0 % (ref 0.0–0.2)

## 2021-09-25 LAB — BASIC METABOLIC PANEL
Anion gap: 9 (ref 5–15)
BUN: 12 mg/dL (ref 8–23)
CO2: 26 mmol/L (ref 22–32)
Calcium: 8.8 mg/dL — ABNORMAL LOW (ref 8.9–10.3)
Chloride: 104 mmol/L (ref 98–111)
Creatinine, Ser: 0.9 mg/dL (ref 0.44–1.00)
GFR, Estimated: >60 mL/min (ref >60)
Glucose, Bld: 97 mg/dL (ref 70–99)
Potassium: 4 mmol/L (ref 3.5–5.1)
Sodium: 139 mmol/L (ref 135–145)

## 2021-09-25 LAB — GLUCOSE, CAPILLARY
Glucose-Capillary: 109 mg/dL — ABNORMAL HIGH (ref 70–99)
Glucose-Capillary: 118 mg/dL — ABNORMAL HIGH (ref 70–99)
Glucose-Capillary: 118 mg/dL — ABNORMAL HIGH (ref 70–99)
Glucose-Capillary: 142 mg/dL — ABNORMAL HIGH (ref 70–99)

## 2021-09-25 LAB — PHOSPHORUS: Phosphorus: 4.5 mg/dL (ref 2.5–4.6)

## 2021-09-25 LAB — MAGNESIUM: Magnesium: 2 mg/dL (ref 1.7–2.4)

## 2021-09-25 MED ORDER — POTASSIUM CHLORIDE CRYS ER 10 MEQ PO TBCR
10.0000 meq | EXTENDED_RELEASE_TABLET | Freq: Every day | ORAL | Status: DC
  Administered 2021-09-25 – 2021-09-29 (×5): 10 meq via ORAL
  Filled 2021-09-25 (×5): qty 1

## 2021-09-25 MED ORDER — OLANZAPINE 2.5 MG PO TABS
2.5000 mg | ORAL_TABLET | Freq: Once | ORAL | Status: AC
Start: 2021-09-25 — End: 2021-09-25
  Administered 2021-09-25: 2.5 mg via ORAL
  Filled 2021-09-25: qty 1

## 2021-09-25 NOTE — Progress Notes (Signed)
Hydralazine given due to am vitals BP elevated. Pt was agitated confused and wet from purewick leaking. Pt cleaned and allowed time to rest. Completed recheck BP remains elevated. Hydralazine given.   09/25/21 0651  Vitals  BP (!) 166/87 (BP recheck Bp remains over 160 hydralazine given)

## 2021-09-25 NOTE — TOC Progression Note (Addendum)
Transition of Care [facility]) - Progression Note    Patient Details  Name: [name] [name] MRN: [id number] Date of Birth: [birth year]  Transition of Care [facility]) CM/SW [place], [place] Phone Number: 09/25/2021, 12:04 PM  Clinical Narrative:      Update-3:09pm- Patients insurance authorization still pending.  Star with [facility] confirmed SNF bed offer for patient. CSW informed patients daughter [name] who accepted SNF bed offer. CSW started insurance authorization for patient. [employer] ID# Reference # [id number].  Update- CSW received callback from patients daughter [name] who confirmed she would like for patient to go to SNF before returning back to her memory care. CSW provided patients daughter [name] with SNF bed offers. Pateints daughter number one choice for SNF placement for patient is [facility]. CSW reached out to Star with [facility] who is reviewing referral. CSW awaiting determination.   PT reeval completed.CSW left voicemail for patients daughter [name] to discuss DC plan for patient. Memory care with [facility] pt/ot orders vrs. SNF. CSW awaiting callback. CSW will continue to follow and assist with patients dc planning needs.    Expected Discharge Plan and Services                                                 Social Determinants of Health (SDOH) Interventions    Readmission Risk Interventions     No data to display

## 2021-09-25 NOTE — [place] FL2 (Signed)
[place] MEDICAID FL2 LEVEL OF CARE SCREENING TOOL     IDENTIFICATION  Patient Name: [name] [name] Birthdate: 02/23/1932 Sex: female Admission Date (Current Location): 09/21/2021  [facility] and [place] Number:  Herbalist and Address:  [facility], [place] [address] [place], [place] [address]      Provider Number: [id number]  Attending Physician Name and Address:  Elodia Florence., *  Relative Name and Phone Number:  [name] (daughter) [phone]    Current Level of Care: Hospital Recommended Level of Care: [place] Prior Approval Number:    Date Approved/Denied:   PASRR Number: [id number]  Discharge Plan: SNF    Current Diagnoses: Patient Active Problem List   Diagnosis Date Noted   Seizure-like activity ([place]) 09/23/2021   Laceration of left upper arm 09/23/2021   Hypomagnesemia 09/23/2021   Asymptomatic bacteriuria 09/23/2021   Fall at nursing home, initial encounter 09/21/2021   DNR (do not resuscitate) 09/21/2021   Polymorphic ventricular tachycardia ([place]) 09/21/2021   Closed fracture of right femur, unspecified fracture morphology, initial encounter ([place]) 09/02/2020   Fracture of femoral neck, right ([place]) 09/01/2020   Closed hip fracture, right, initial encounter ([place]) 09/01/2020   Chronic obstructive pulmonary disease ([place]) 03/20/2019   Major depressive disorder with single episode, in partial remission ([place]) 03/20/2019   Arthritis of carpometacarpal ([place]) joint of left thumb 06/02/2017   Current use of long term anticoagulation 06/02/2017   HCAP (healthcare-associated pneumonia) 04/04/2017   Traumatic perinephric hematoma of left kidney 03/29/2017   Acute blood loss as cause of postoperative anemia 03/29/2017   Left renal mass 01/27/2017   Overactive bladder 07/09/2016   Dizziness 05/07/2016   Microscopic hematuria 09/02/2015   CAD (coronary artery disease) 04/17/2014   Allergic rhinitis 04/17/2014    Dementia ([place]) 04/17/2014   Hearing loss 04/17/2014   Pacemaker -CRT-St. Jude 05/21/2013   Atrioventricular block, complete ([place]) 03/08/2013   Paroxysmal atrial fibrillation ([place]) 02/03/2013   Asthma, intrinsic 02/03/2013   Chronic combined systolic and diastolic congestive heart failure ([place]) 10/10/2012   Depression 09/19/2012   Hypokalemia 07/14/2012   Syncope 07/11/2012   Varicose veins 09/29/2009   History of melanoma 09/06/2008   LBBB (left bundle branch block) 09/06/2008   History of uterine cancer 09/06/2008   Hyperlipidemia 10/06/2007   Essential hypertension 10/06/2007   HFrEF (heart failure with reduced ejection fraction) ([place]) 10/06/2007    Orientation RESPIRATION BLADDER Height & Weight     Self  Normal External catheter, Incontinent (External Urinary Catheter) Weight: 132 lb 7.9 oz (60.1 kg) Height:     BEHAVIORAL SYMPTOMS/MOOD NEUROLOGICAL BOWEL NUTRITION STATUS      Incontinent (WDL) Diet (Please see discharge summary)  AMBULATORY STATUS COMMUNICATION OF NEEDS Skin   Limited Assist Verbally Other (Comment) (Appropriate for ethnicity,dry,Abrasion leg,Left,Ecchymosis,arm,bilateral,non-tenting)                       Personal Care Assistance Level of Assistance  Bathing, Feeding, Dressing Bathing Assistance: Maximum assistance Feeding assistance: Limited assistance Dressing Assistance: Maximum assistance     Functional Limitations Info  Sight, Hearing, Speech Sight Info: Adequate (WDL) Hearing Info: Adequate (WDL) Speech Info: Adequate (WDL)    SPECIAL CARE FACTORS FREQUENCY  PT (By licensed PT), OT (By licensed OT)     PT Frequency: 5x min weekly OT Frequency: 5x min weekly            Contractures Contractures Info: Not present  Additional Factors Info  Code Status, Allergies, Psychotropic Code Status Info: DNR Allergies Info: Aricept (donepezil),Nitroglycerin Psychotropic Info: escitalopram (LEXAPRO) tablet 20 mg daily,OLANZapine  (ZYPREXA) tablet 5 mg daily at bedtime         Current Medications (09/25/2021):  This is the current hospital active medication list Current Facility-Administered Medications  Medication Dose Route Frequency Provider Last Rate Last Admin   acetaminophen (TYLENOL) tablet 650 mg  650 mg Oral Q6H PRN Rise Patience, MD   650 mg at 09/23/21 2241   Or   acetaminophen (TYLENOL) suppository 650 mg  650 mg Rectal Q6H PRN Rise Patience, MD       apixaban Arne Cleveland) tablet 2.5 mg  2.5 mg Oral BID Kris Mouton, RPH   2.5 mg at 09/25/21 1045   escitalopram (LEXAPRO) tablet 10 mg  10 mg Oral Daily Elodia Florence., MD   10 mg at 09/25/21 1045   hydrALAZINE (APRESOLINE) injection 5 mg  5 mg Intravenous Q4H PRN Rise Patience, MD   5 mg at 09/25/21 9449   melatonin tablet 5 mg  5 mg Oral QHS Domenic Polite, MD   5 mg at 09/24/21 2119   memantine (NAMENDA) tablet 5 mg  5 mg Oral BID Domenic Polite, MD   5 mg at 09/25/21 1045   OLANZapine (ZYPREXA) tablet 2.5 mg  2.5 mg Oral QHS Elodia Florence., MD   2.5 mg at 09/24/21 2120   potassium chloride (KLOR-CON M) CR tablet 10 mEq  10 mEq Oral Daily Shirley Friar, PA-C   10 mEq at 09/25/21 1045   pyridostigmine (MESTINON) tablet 60 mg  60 mg Oral Daily Domenic Polite, MD   60 mg at 09/25/21 1044   spironolactone (ALDACTONE) tablet 12.5 mg  12.5 mg Oral Daily Chandrasekhar, Mahesh A, MD   12.5 mg at 09/25/21 1045     Discharge Medications: Please see discharge summary for a list of discharge medications.  Relevant Imaging Results:  Relevant Lab Results:   Additional Information [phone]  Milas Gain, LCSWA

## 2021-09-25 NOTE — Progress Notes (Signed)
PROGRESS NOTE    [name] [name]  RWE:[id number] DOB: 1931/09/20 DOA: 09/21/2021 PCP: System, Provider Not In  Chief Complaint  Patient presents with   Seizures    Brief Narrative:  89/F with advanced dementia from memory care unit, history of COPD, CAD with PCI, chronic systolic CHF, orthostatic hypotension who was brought to the ED earlier in the day on 6/12 after an unwitnessed fall, she was discharged and presented back to the ED same evening with possible seizure-like activity with loss of consciousness. -In the emergency room patient was noted to be in polymorphic VT, cardiology was consulted and she was started on amiodarone infusion -Overnight EEG without seizure activity    Assessment & Plan:   Principal Problem:   Polymorphic ventricular tachycardia (HCC) Active Problems:   Dementia with behavioral disturbance (HCC)   Syncope   Laceration of left upper arm   Hypokalemia   Seizure-like activity (HCC)   Hypomagnesemia   HFrEF (heart failure with reduced ejection fraction) (HCC)   Paroxysmal atrial fibrillation ([place])   Asymptomatic bacteriuria   Essential hypertension   CAD (coronary artery disease)   DNR (do not resuscitate)   Assessment and Plan: * Polymorphic ventricular tachycardia ([place]) Appreciate cardiology assistance Recommending transitioning to aldactone, replace lytes Will continue to follow QTc (prolonged, V paced) EP consulted -> recommended d/c amio/seroquel, start aldcatone - they thought cause was volume overload, electrolyte abnormalities, and QT prolonging meds EP recommending daily potassium, appreciate recs lexapro and zyprexa decreased  Dementia with behavioral disturbance ([place]) Reduce lexapro, reduce zyprexa discussed with pharmacy [facility] [place] for palliative outpatient Getting more delirious unfortunately, given extra dose of zyprexa today.  Will follow closely.  Delirium precautions.  Syncope In setting of torsades, above Hx  orthostatic hypotension Follow orthostatics (unable to stand this morning for orthostatics, but she did walk 70 feet with RW with therapy this AM), PT/OT She mentioned LH to me yesterday, unclear today, will follow orthostatics and therapy eval    Laceration of left upper arm 3 sutures placed to LUE, prolene Needs removal in follow up (placed 6/12) Plan for 7 days  Hypomagnesemia Goal >2  Seizure-like activity (HCC) Due to syncope above, convulsive syncope Appreciate neurology recs EEG without seizures or epileptiform discharges Tramadol d/c'd  Hypokalemia Goal >4  Paroxysmal atrial fibrillation ([place]) Initially was on hold, but after discussion with Dr. Caryl Comes, planning to resume this at reduced dose 2.5 mg BID  HFrEF (heart failure with reduced ejection fraction) ([place]) Appears euvolemic, continue home meds Echo 08/2020 with EF 35-40%  Asymptomatic bacteriuria Noted, follow      DVT prophylaxis: eliquis Code Status: dnr Family Communication: [name] 6/14 Disposition:   Status is: Inpatient Remains inpatient appropriate because: need for additional w/u, safe d/c plan   Consultants:  Neurology cardiology  Procedures:  none  Antimicrobials:  Anti-infectives (From admission, onward)    Start     Dose/Rate Route Frequency Ordered Stop   09/22/21 2200  cefTRIAXone (ROCEPHIN) 1 g in sodium chloride 0.9 % 100 mL IVPB  Status:  Discontinued        1 g 200 mL/hr over 30 Minutes Intravenous Every 24 hours 09/22/21 0200 09/22/21 1101   09/22/21 0215  cefTRIAXone (ROCEPHIN) 1 g in sodium chloride 0.9 % 100 mL IVPB        1 g 200 mL/hr over 30 Minutes Intravenous  Once 09/22/21 0203 09/22/21 0305       Subjective: Progressive confusion No specific complaints  Objective: Vitals:  09/25/21 0423 09/25/21 0500 09/25/21 0651 09/25/21 1233  BP: (!) 168/81  (!) 166/87 (!) 157/68  Pulse: 70   70  Resp:    19  Temp: 98.6 F (37 C)   98.6 F (37 C)  TempSrc:  Oral   Oral  SpO2: 95%   96%  Weight:  60.1 kg      Intake/Output Summary (Last 24 hours) at 09/25/2021 1840 Last data filed at 09/25/2021 1825 Gross per 24 hour  Intake 87 ml  Output 2350 ml  Net -2263 ml   Filed Weights   09/24/21 0434 09/25/21 0500  Weight: 59.3 kg 60.1 kg    Examination: General: No acute distress. Cardiovascular: RRR Lungs: unlabored Abdomen: Soft, nontender, nondistended Neurological: more confused, moving all extremities Extremities: No clubbing or cyanosis. No edema.   Data Reviewed: I have personally reviewed following labs and imaging studies  CBC: Recent Labs  Lab 09/21/21 1432 09/21/21 1931 09/21/21 2001 09/22/21 0441 09/23/21 0103 09/24/21 0332 09/25/21 0247  WBC 7.4 10.7*  --  9.6 6.7 6.8 6.8  NEUTROABS 6.0 8.1*  --  7.8*  --  4.5 4.6  HGB 11.3* 12.2 13.6 11.7* 11.1* 11.7* 12.4  HCT 35.8* 40.2 40.0 37.0 34.0* 36.5 38.2  MCV 97.8 99.0  --  97.4 96.3 98.1 95.3  PLT 119* 128*  --  112* 101* 110* 124*    Basic Metabolic Panel: Recent Labs  Lab 09/21/21 1955 09/21/21 2001 09/22/21 0441 09/23/21 0103 09/23/21 1330 09/24/21 0332 09/25/21 0247  NA  --    < > 140 143 140 140 139  K  --    < > 4.0 3.1* 3.9 4.7 4.0  CL  --    < > 106 104 104 106 104  CO2  --   --  '25 29 24 27 26  '$ GLUCOSE  --    < > 118* 103* 91 98 97  BUN  --    < > '11 10 10 12 12  '$ CREATININE  --    < > 0.84 0.83 0.82 0.99 0.90  CALCIUM  --   --  8.3* 8.2* 8.3* 8.6* 8.8*  MG 1.8  --  1.7 2.2  --  1.9 2.0  PHOS  --   --   --   --   --   --  4.5   < > = values in this interval not displayed.    GFR: CrCl cannot be calculated (Unknown ideal weight.).  Liver Function Tests: No results for input(s): "AST", "ALT", "ALKPHOS", "BILITOT", "PROT", "ALBUMIN" in the last 168 hours.  CBG: Recent Labs  Lab 09/24/21 0727 09/24/21 1409 09/25/21 0006 09/25/21 0643 09/25/21 1230  GLUCAP 97 98 118* 109* 142*     Recent Results (from the past 240 hour(s))  SARS  Coronavirus 2 by RT PCR (hospital order, performed in [facility] hospital lab) *cepheid single result test* Anterior Nasal Swab     Status: None   Collection Time: 09/21/21  2:39 PM   Specimen: Anterior Nasal Swab  Result Value Ref Range Status   SARS Coronavirus 2 by RT PCR NEGATIVE NEGATIVE Final    Comment: (NOTE) SARS-CoV-2 target nucleic acids are NOT DETECTED.  The SARS-CoV-2 RNA is generally detectable in upper and lower respiratory specimens during the acute phase of infection. The lowest concentration of SARS-CoV-2 viral copies this assay can detect is 250 copies / mL. Chauntel Windsor negative result does not preclude SARS-CoV-2 infection and should not be used as  the sole basis for treatment or other patient management decisions.  Donni Oglesby negative result may occur with improper specimen collection / handling, submission of specimen other than nasopharyngeal swab, presence of viral mutation(s) within the areas targeted by this assay, and inadequate number of viral copies (<250 copies / mL). Abbygayle Helfand negative result must be combined with clinical observations, patient history, and epidemiological information.  Fact Sheet for Patients:   https://www.patel.info/  Fact Sheet for Healthcare Providers: https://hall.com/  This test is not yet approved or  cleared by the [place] FDA and has been authorized for detection and/or diagnosis of SARS-CoV-2 by FDA under an Emergency Use Authorization (EUA).  This EUA will remain in effect (meaning this test can be used) for the duration of the COVID-19 declaration under Section 564(b)(1) of the Act, 21 U.S.C. section 360bbb-3(b)(1), unless the authorization is terminated or revoked sooner.  Performed at [facility], [place] [address] [place], [place] [address]   Urine Culture     Status: Abnormal   Collection Time: 09/22/21  2:17 AM   Specimen: Urine, Clean Catch  Result Value Ref Range Status   Specimen  Description URINE, CLEAN CATCH  Final   Special Requests   Final    NONE Performed at [facility], [place] [address], [place], [place] [address]    Culture 30,000 COLONIES/mL KLEBSIELLA PNEUMONIAE (Edy Mcbane)  Final   Report Status 09/24/2021 FINAL  Final   Organism ID, Bacteria KLEBSIELLA PNEUMONIAE (Caitlinn Klinker)  Final      Susceptibility   Klebsiella pneumoniae - MIC*    AMPICILLIN RESISTANT Resistant     CEFAZOLIN <=4 SENSITIVE Sensitive     CEFEPIME <=0.12 SENSITIVE Sensitive     CEFTRIAXONE <=0.25 SENSITIVE Sensitive     CIPROFLOXACIN <=0.25 SENSITIVE Sensitive     GENTAMICIN <=1 SENSITIVE Sensitive     IMIPENEM 0.5 SENSITIVE Sensitive     NITROFURANTOIN 64 INTERMEDIATE Intermediate     TRIMETH/SULFA <=20 SENSITIVE Sensitive     AMPICILLIN/SULBACTAM <=2 SENSITIVE Sensitive     PIP/TAZO <=4 SENSITIVE Sensitive     * 30,000 COLONIES/mL KLEBSIELLA PNEUMONIAE         Radiology Studies: No results found.      Scheduled Meds:  apixaban  2.5 mg Oral BID   escitalopram  10 mg Oral Daily   melatonin  5 mg Oral QHS   memantine  5 mg Oral BID   OLANZapine  2.5 mg Oral QHS   potassium chloride  10 mEq Oral Daily   pyridostigmine  60 mg Oral Daily   spironolactone  12.5 mg Oral Daily   Continuous Infusions:   LOS: 4 days    Time spent: over 30 min    Fayrene Helper, MD [facility]   To contact the attending provider between 7A-7P or the covering provider during after hours 7P-7A, please log into the web site [email or web address] and access using universal [place] password for that web site. If you do not have the password, please call the hospital operator.  09/25/2021, 6:40 PM

## 2021-09-25 NOTE — Progress Notes (Signed)
Mobility Specialist: Progress Note   09/25/21 1647  Mobility  Activity Stood at bedside  Level of Assistance Moderate assist, patient does 50-74%  Assistive Device Front wheel walker  Activity Response Tolerated fair  $Mobility charge 1 Mobility   Pt received in the bed and agreeable to mobility. ModA with bed mobility as well as to stand. STS x3 with flexed posture as well as posterior lean. Verbal cues for posture and hand placement, pt fatigued quickly requesting to sit. Pt assisted back to bed with call bell at her side. Bed alarm is on.   [facility] Joseandres Mazer Mobility Specialist Mobility Specialist [address] [place]: [phone]

## 2021-09-25 NOTE — Plan of Care (Signed)
Pt alert and oriented to self which is baseline. Pt slow to follow commands. Pt vitals stable. Orthostatics completed. Bath done this am. No seizure activity this shift.  Problem: Education: Goal: Knowledge of General Education information will improve Description: Including pain rating scale, medication(s)/side effects and non-pharmacologic comfort measures Outcome: Progressing   Problem: Health Behavior/Discharge Planning: Goal: Ability to manage health-related needs will improve Outcome: Progressing   Problem: Clinical Measurements: Goal: Ability to maintain clinical measurements within normal limits will improve Outcome: Progressing Goal: Will remain free from infection Outcome: Progressing Goal: Diagnostic test results will improve Outcome: Progressing Goal: Respiratory complications will improve Outcome: Progressing Goal: Cardiovascular complication will be avoided Outcome: Progressing   Problem: Activity: Goal: Risk for activity intolerance will decrease Outcome: Progressing   Problem: Nutrition: Goal: Adequate nutrition will be maintained Outcome: Progressing   Problem: Coping: Goal: Level of anxiety will decrease Outcome: Progressing   Problem: Elimination: Goal: Will not experience complications related to bowel motility Outcome: Progressing Goal: Will not experience complications related to urinary retention Outcome: Progressing   Problem: Pain Managment: Goal: General experience of comfort will improve Outcome: Progressing   Problem: Safety: Goal: Ability to remain free from injury will improve Outcome: Progressing   Problem: Skin Integrity: Goal: Risk for impaired skin integrity will decrease Outcome: Progressing

## 2021-09-25 NOTE — Progress Notes (Signed)
Physical Therapy Treatment Patient Details Name: [name] [name] MRN: [id number] DOB: November 06, 1931 Today's Date: 09/25/2021   History of Present Illness 88 yo presented to ED 6/12 from [place] at [facility] memory care with fall and was D/Cd with return same day with LOC and polymorphic VT. PMhx:advanced dementia, COPD, CAD with PCI, chronic systolic CHF, orthostatic hypotension, Afib    PT Comments    Pt remains pleasantly confused and limited by [facility] who states "I will stop doing what was bad" and seems more concerned today than yesterday. Increased need for cues and direction to initiate mobility with pt able to walk further with RW with continued need for min-mod physical assist due to posterior lean and pushing RW too anterior. Pt up to chair for breakfast with assessment and recommendation still appropriate.     Recommendations for follow up therapy are one component of a multi-disciplinary discharge planning process, led by the attending physician.  Recommendations may be updated based on patient status, additional functional criteria and insurance authorization.  Follow Up Recommendations  Long-term institutional care without follow-up therapy (return to memory care if staff can provide physical assist with mobility, otherwise SNF)     Assistance Recommended at Discharge Frequent or constant Supervision/Assistance  Patient can return home with the following A lot of help with walking and/or transfers;A lot of help with bathing/dressing/bathroom;Assistance with feeding;Assist for transportation;Assistance with cooking/housework   Equipment Recommendations  None recommended by PT    Recommendations for Other Services       Precautions / Restrictions Precautions Precautions: Fall Restrictions Weight Bearing Restrictions: No     Mobility  Bed Mobility Overal bed mobility: Needs Assistance Bed Mobility: Supine to Sit     Supine to sit: HOB elevated, Min guard     General bed  mobility comments: multimodal cues for initiation and progression to sitting requiring 2 trials before pt able to complete with increased time, guarding for balance EOB    Transfers Overall transfer level: Needs assistance   Transfers: Sit to/from Stand Sit to Stand: Min assist           General transfer comment: min assist to rise from bed with cues for hand placement    Ambulation/Gait Ambulation/Gait assistance: Mod assist Gait Distance (Feet): 70 Feet Assistive device: Rolling walker (2 wheels) Gait Pattern/deviations: Step-through pattern, Decreased stride length, Trunk flexed, Wide base of support   Gait velocity interpretation: <1.8 ft/sec, indicate of risk for recurrent falls   General Gait Details: pt with self too posterior to RW and unable to correct with multimodal cues, wide BOS, flexed trunk and assist to advance RW and mod assist to control balance and advancing gait   Stairs             Wheelchair Mobility    Modified Rankin (Stroke Patients Only)       Balance Overall balance assessment: Needs assistance Sitting-balance support: No upper extremity supported, Feet supported Sitting balance-Leahy Scale: Poor Sitting balance - Comments: guarding with sitting with 1 episode of posterior LOB   Standing balance support: Bilateral upper extremity supported Standing balance-Leahy Scale: Poor Standing balance comment: RW and physical assist to maintain standing                            Cognition Arousal/Alertness: Awake/alert Behavior During Therapy: Flat affect Overall Cognitive Status: History of cognitive impairments - at baseline  General Comments: baseline dementia, no family present to confirm current presentation        Exercises General Exercises - Lower Extremity Long Arc Quad: Both, 10 reps, Seated, AAROM    General Comments        Pertinent Vitals/Pain Pain  Assessment Pain Assessment: No/denies pain    Home Living                          Prior Function            PT Goals (current goals can now be found in the care plan section) Progress towards PT goals: Progressing toward goals    Frequency    Min 1X/week      PT Plan Current plan remains appropriate    Co-evaluation              AM-PAC PT "6 Clicks" Mobility   Outcome Measure  Help needed turning from your back to your side while in a flat bed without using bedrails?: A Little Help needed moving from lying on your back to sitting on the side of a flat bed without using bedrails?: A Little Help needed moving to and from a bed to a chair (including a wheelchair)?: A Lot Help needed standing up from a chair using your arms (e.g., wheelchair or bedside chair)?: A Little Help needed to walk in hospital room?: A Lot Help needed climbing 3-5 steps with a railing? : Total 6 Click Score: 14    End of Session Equipment Utilized During Treatment: Gait belt Activity Tolerance: Patient tolerated treatment well Patient left: in chair;with call bell/phone within reach;with chair alarm set Nurse Communication: Mobility status PT Visit Diagnosis: Other abnormalities of gait and mobility (R26.89);Muscle weakness (generalized) (M62.81);Difficulty in walking, not elsewhere classified (R26.2)     Time: 0981-1914 PT Time Calculation (min) (ACUTE ONLY): 22 min  Charges:  $Gait Training: 8-22 mins                     Bayard Males, PT Acute Rehabilitation Services Office: [place] 09/25/2021, 11:27 AM

## 2021-09-25 NOTE — Progress Notes (Signed)
Orthostatic vitals order for this am. Pt bed wed received bath and obtained vitals at this time. Pt unable to tolerate standing attempted with 2 staff members pt unable to stand with 2 assist for the required time.   09/25/21 0425 09/25/21 0430 09/25/21 0433  Orthostatic Lying   BP- Lying 168/81  --   --   Pulse- Lying 106  --   --   Orthostatic Sitting  BP- Sitting  --  189/82  --   Pulse- Sitting  --  115  --   Orthostatic Standing at 3 minutes  BP- Standing at 3 minutes  --   --   (pt refused not tolerating couldn't stand long enough)

## 2021-09-26 DIAGNOSIS — I4729 Other ventricular tachycardia: Secondary | ICD-10-CM | POA: Diagnosis not present

## 2021-09-26 LAB — BASIC METABOLIC PANEL
Anion gap: 10 (ref 5–15)
BUN: 15 mg/dL (ref 8–23)
CO2: 26 mmol/L (ref 22–32)
Calcium: 9.3 mg/dL (ref 8.9–10.3)
Chloride: 102 mmol/L (ref 98–111)
Creatinine, Ser: 1.06 mg/dL — ABNORMAL HIGH (ref 0.44–1.00)
GFR, Estimated: 51 mL/min — ABNORMAL LOW (ref >60)
Glucose, Bld: 113 mg/dL — ABNORMAL HIGH (ref 70–99)
Potassium: 4.3 mmol/L (ref 3.5–5.1)
Sodium: 138 mmol/L (ref 135–145)

## 2021-09-26 LAB — GLUCOSE, CAPILLARY
Glucose-Capillary: 115 mg/dL — ABNORMAL HIGH (ref 70–99)
Glucose-Capillary: 116 mg/dL — ABNORMAL HIGH (ref 70–99)
Glucose-Capillary: 124 mg/dL — ABNORMAL HIGH (ref 70–99)
Glucose-Capillary: 132 mg/dL — ABNORMAL HIGH (ref 70–99)

## 2021-09-26 MED ORDER — METOPROLOL SUCCINATE ER 25 MG PO TB24
25.0000 mg | ORAL_TABLET | Freq: Every day | ORAL | Status: DC
  Administered 2021-09-26 – 2021-09-29 (×4): 25 mg via ORAL
  Filled 2021-09-26 (×4): qty 1

## 2021-09-26 MED ORDER — CEFADROXIL 500 MG PO CAPS
500.0000 mg | ORAL_CAPSULE | Freq: Two times a day (BID) | ORAL | Status: DC
  Administered 2021-09-26 – 2021-09-28 (×5): 500 mg via ORAL
  Filled 2021-09-26 (×6): qty 1

## 2021-09-26 NOTE — Progress Notes (Addendum)
PROGRESS NOTE    [name] [name]  NTI:[id number] DOB: [birth year] DOA: 09/21/2021 PCP: System, Provider Not In  Chief Complaint  Patient presents with   Seizures    Brief Narrative:  89/F with advanced dementia from memory care unit, history of COPD, CAD with PCI, chronic systolic CHF, orthostatic hypotension who was brought to the ED earlier in the day on 6/12 after an unwitnessed fall, she was discharged and presented back to the ED same evening with possible seizure-like activity with loss of consciousness. -In the emergency room patient was noted to be in polymorphic VT, cardiology was consulted and she was started on amiodarone infusion -Overnight EEG without seizure activity    Assessment & Plan:   Principal Problem:   Polymorphic ventricular tachycardia (HCC) Active Problems:   Dementia with behavioral disturbance (HCC)   Syncope   Laceration of left upper arm   Hypokalemia   Seizure-like activity (HCC)   Hypomagnesemia   HFrEF (heart failure with reduced ejection fraction) (HCC)   Paroxysmal atrial fibrillation ([place])   Asymptomatic bacteriuria   Essential hypertension   CAD (coronary artery disease)   DNR (do not resuscitate)   Assessment and Plan: * Polymorphic ventricular tachycardia ([place]) Appreciate cardiology assistance Recommending transitioning to aldactone, replace lytes Will continue to follow QTc (prolonged, V paced) EP consulted -> recommended d/c amio/seroquel, start aldcatone - they thought cause was volume overload, electrolyte abnormalities, and QT prolonging meds Appreciate cardiology recs - metoprolol, aldactone lexapro and zyprexa decreased  Dementia with behavioral disturbance ([place]) Reduce lexapro, reduce zyprexa discussed with pharmacy [facility] [place] for palliative outpatient Progressive delirium today again - sleeping [name] lot of today - woke up [name] [name] little more recently (per discussion with RN).  Will follow closely.  Delirium precautions.   Try to get her out of bed if possible/safe.  Syncope In setting of torsades, above Hx orthostatic hypotension Follow orthostatics (unable to stand this morning for orthostatics, but she did walk 70 feet with RW with therapy this AM), PT/OT She mentioned LH to me yesterday, unclear today, will follow orthostatics and therapy eval    Laceration of left upper arm 3 sutures placed to LUE, prolene Needs removal in follow up (placed 6/12) Plan for 7 days  Hypomagnesemia Goal >2  Seizure-like activity (HCC) Due to syncope above, convulsive syncope Appreciate neurology recs EEG without seizures or epileptiform discharges Tramadol d/c'd  Hypokalemia Goal >4  Paroxysmal atrial fibrillation ([place]) Initially was on hold, but after discussion with Dr. Caryl Comes, planning to resume this at reduced dose 2.5 mg BID  HFrEF (heart failure with reduced ejection fraction) ([place]) Appears euvolemic, continue home meds Echo 08/2020 with EF 35-40%  Asymptomatic bacteriuria Noted, follow Afebrile with normal wbc count Continue to trend, doubt this is related to current delirium, could consider treating?      DVT prophylaxis: eliquis Code Status: dnr Family Communication: [name] 6/14 Disposition:   Status is: Inpatient Remains inpatient appropriate because: need for additional w/u, safe d/c plan   Consultants:  Neurology cardiology  Procedures:  none  Antimicrobials:  Anti-infectives (From admission, onward)    Start     Dose/Rate Route Frequency Ordered Stop   09/22/21 2200  cefTRIAXone (ROCEPHIN) 1 g in sodium chloride 0.9 % 100 mL IVPB  Status:  Discontinued        1 g 200 mL/hr over 30 Minutes Intravenous Every 24 hours 09/22/21 0200 09/22/21 1101   09/22/21 0215  cefTRIAXone (ROCEPHIN) 1 g in sodium chloride 0.9 %  100 mL IVPB        1 g 200 mL/hr over 30 Minutes Intravenous  Once 09/22/21 0203 09/22/21 0305       Subjective: More sleepy today  Objective: Vitals:    09/25/21 1233 09/25/21 2032 09/26/21 0600 09/26/21 1029  BP: (!) 157/68 (!) 153/67 (!) 160/58 (!) 137/91  Pulse: 70 70 70 70  Resp: '19 16  20  '$ Temp: 98.6 F (37 C) 98.1 F (36.7 C) 98.1 F (36.7 C) 97.6 F (36.4 C)  TempSrc: Oral Oral Axillary Oral  SpO2: 96%  100% 99%  Weight:   58.3 kg     Intake/Output Summary (Last 24 hours) at 09/26/2021 1738 Last data filed at 09/26/2021 1639 Gross per 24 hour  Intake 87 ml  Output 650 ml  Net -563 ml   Filed Weights   09/24/21 0434 09/25/21 0500 09/26/21 0600  Weight: 59.3 kg 60.1 kg 58.3 kg    Examination: General: No acute distress. Cardiovascular: RRR Lungs: unlabored Abdomen: Soft, nontender, nondistended  Neurological: sleeping, wakes with voice/physical stimuli, she falls to sleep if not continuously stimulated after 10-15 seconds.  Moving all extremities. Extremities: No clubbing or cyanosis. No edema.  Data Reviewed: I have personally reviewed following labs and imaging studies  CBC: Recent Labs  Lab 09/21/21 1432 09/21/21 1931 09/21/21 2001 09/22/21 0441 09/23/21 0103 09/24/21 0332 09/25/21 0247  WBC 7.4 10.7*  --  9.6 6.7 6.8 6.8  NEUTROABS 6.0 8.1*  --  7.8*  --  4.5 4.6  HGB 11.3* 12.2 13.6 11.7* 11.1* 11.7* 12.4  HCT 35.8* 40.2 40.0 37.0 34.0* 36.5 38.2  MCV 97.8 99.0  --  97.4 96.3 98.1 95.3  PLT 119* 128*  --  112* 101* 110* 124*    Basic Metabolic Panel: Recent Labs  Lab 09/21/21 1955 09/21/21 2001 09/22/21 0441 09/23/21 0103 09/23/21 1330 09/24/21 0332 09/25/21 0247 09/26/21 0132  NA  --    < > 140 143 140 140 139 138  K  --    < > 4.0 3.1* 3.9 4.7 4.0 4.3  CL  --    < > 106 104 104 106 104 102  CO2  --   --  '25 29 24 27 26 26  '$ GLUCOSE  --    < > 118* 103* 91 98 97 113*  BUN  --    < > '11 10 10 12 12 15  '$ CREATININE  --    < > 0.84 0.83 0.82 0.99 0.90 1.06*  CALCIUM  --   --  8.3* 8.2* 8.3* 8.6* 8.8* 9.3  MG 1.8  --  1.7 2.2  --  1.9 2.0  --   PHOS  --   --   --   --   --   --  4.5  --     < > = values in this interval not displayed.    GFR: CrCl cannot be calculated (Unknown ideal weight.).  Liver Function Tests: No results for input(s): "AST", "ALT", "ALKPHOS", "BILITOT", "PROT", "ALBUMIN" in the last 168 hours.  CBG: Recent Labs  Lab 09/25/21 1230 09/25/21 2043 09/26/21 0023 09/26/21 0555 09/26/21 1201  GLUCAP 142* 118* 116* 124* 115*     Recent Results (from the past 240 hour(s))  SARS Coronavirus 2 by RT PCR (hospital order, performed in [facility] hospital lab) *cepheid single result test* Anterior Nasal Swab     Status: None   Collection Time: 09/21/21  2:39 PM   Specimen:  Anterior Nasal Swab  Result Value Ref Range Status   SARS Coronavirus 2 by RT PCR NEGATIVE NEGATIVE Final    Comment: (NOTE) SARS-CoV-2 target nucleic acids are NOT DETECTED.  The SARS-CoV-2 RNA is generally detectable in upper and lower respiratory specimens during the acute phase of infection. The lowest concentration of SARS-CoV-2 viral copies this assay can detect is 250 copies / mL. Detta Mellin negative result does not preclude SARS-CoV-2 infection and should not be used as the sole basis for treatment or other patient management decisions.  Xiomar Crompton negative result may occur with improper specimen collection / handling, submission of specimen other than nasopharyngeal swab, presence of viral mutation(s) within the areas targeted by this assay, and inadequate number of viral copies (<250 copies / mL). Jakyrie Totherow negative result must be combined with clinical observations, patient history, and epidemiological information.  Fact Sheet for Patients:   https://www.patel.info/  Fact Sheet for Healthcare Providers: https://hall.com/  This test is not yet approved or  cleared by the [place] FDA and has been authorized for detection and/or diagnosis of SARS-CoV-2 by FDA under an Emergency Use Authorization (EUA).  This EUA will remain in effect (meaning  this test can be used) for the duration of the COVID-19 declaration under Section 564(b)(1) of the Act, 21 U.S.C. section 360bbb-3(b)(1), unless the authorization is terminated or revoked sooner.  Performed at [facility], [place] [address], [place], [place] [address]   Urine Culture     Status: Abnormal   Collection Time: 09/22/21  2:17 AM   Specimen: Urine, Clean Catch  Result Value Ref Range Status   Specimen Description URINE, CLEAN CATCH  Final   Special Requests   Final    NONE Performed at [facility], [place] [address], [place], [place] [address]    Culture 30,000 COLONIES/mL KLEBSIELLA PNEUMONIAE (Reinette Cuneo)  Final   Report Status 09/24/2021 FINAL  Final   Organism ID, Bacteria KLEBSIELLA PNEUMONIAE (Margarito Dehaas)  Final      Susceptibility   Klebsiella pneumoniae - MIC*    AMPICILLIN RESISTANT Resistant     CEFAZOLIN <=4 SENSITIVE Sensitive     CEFEPIME <=0.12 SENSITIVE Sensitive     CEFTRIAXONE <=0.25 SENSITIVE Sensitive     CIPROFLOXACIN <=0.25 SENSITIVE Sensitive     GENTAMICIN <=1 SENSITIVE Sensitive     IMIPENEM 0.5 SENSITIVE Sensitive     NITROFURANTOIN 64 INTERMEDIATE Intermediate     TRIMETH/SULFA <=20 SENSITIVE Sensitive     AMPICILLIN/SULBACTAM <=2 SENSITIVE Sensitive     PIP/TAZO <=4 SENSITIVE Sensitive     * 30,000 COLONIES/mL KLEBSIELLA PNEUMONIAE         Radiology Studies: No results found.      Scheduled Meds:  apixaban  2.5 mg Oral BID   escitalopram  10 mg Oral Daily   melatonin  5 mg Oral QHS   memantine  5 mg Oral BID   metoprolol succinate  25 mg Oral Daily   OLANZapine  2.5 mg Oral QHS   potassium chloride  10 mEq Oral Daily   pyridostigmine  60 mg Oral Daily   spironolactone  12.5 mg Oral Daily   Continuous Infusions:   LOS: 5 days    Time spent: over 30 min    Fayrene Helper, MD [facility]   To contact the attending provider between 7A-7P or the covering provider during after hours 7P-7A, please log into the web  site [email or web address] and access using universal [place] password for that web site. If  you do not have the password, please call the hospital operator.  09/26/2021, 5:38 PM

## 2021-09-26 NOTE — Progress Notes (Signed)
Progress Note  Patient Name: [name] [name] Date of Encounter: 09/26/2021  [facility] Cardiologist: Virl Axe, MD   Subjective   Saw EP 09/27/21: Overnight: no further events Tired but doing well.  Dementia stable  Inpatient Medications    Scheduled Meds:  apixaban  2.5 mg Oral BID   escitalopram  10 mg Oral Daily   melatonin  5 mg Oral QHS   memantine  5 mg Oral BID   OLANZapine  2.5 mg Oral QHS   potassium chloride  10 mEq Oral Daily   pyridostigmine  60 mg Oral Daily   spironolactone  12.5 mg Oral Daily   Continuous Infusions:   PRN Meds: acetaminophen **OR** acetaminophen, hydrALAZINE   Vital Signs    Vitals:   09/25/21 0651 09/25/21 1233 09/25/21 2032 09/26/21 0600  BP: (!) 166/87 (!) 157/68 (!) 153/67 (!) 160/58  Pulse:  70 70 70  Resp:  19 16   Temp:  98.6 F (37 C) 98.1 F (36.7 C) 98.1 F (36.7 C)  TempSrc:  Oral Oral Axillary  SpO2:  96%  100%  Weight:    58.3 kg    Intake/Output Summary (Last 24 hours) at 09/26/2021 0735 Last data filed at 09/25/2021 2130 Gross per 24 hour  Intake 87 ml  Output 1700 ml  Net -1613 ml      09/26/2021    6:00 AM 09/25/2021    5:00 AM 09/24/2021    4:34 AM  Last 3 Weights  Weight (lbs) 128 lb 8.5 oz 132 lb 7.9 oz 130 lb 11.7 oz  Weight (kg) 58.3 kg 60.1 kg 59.3 kg      Telemetry    V Paced - Personally Reviewed  ECG    V paced rhythm, JTC 350 - Personally Reviewed  Physical Exam   GEN: No acute distress.   Neck: No JVD Cardiac: RRR, no murmurs, rubs, or gallops.  Respiratory: Clear to auscultation bilaterally GI: Soft, nontender, non-distended  MS: No leg edema Neuro:  cognitive impaired but conversant, somnolent today but rousable Psych: Calm   Labs    High Sensitivity Troponin:   Recent Labs  Lab 09/21/21 1432 09/21/21 1725 09/21/21 1931 09/21/21 2324  TROPONINIHS 31* 25* 33* 40*     Chemistry Recent Labs  Lab 09/23/21 0103 09/23/21 1330 09/24/21 0332 09/25/21 0247  09/26/21 0132  NA 143   < > 140 139 138  K 3.1*   < > 4.7 4.0 4.3  CL 104   < > 106 104 102  CO2 29   < > '27 26 26  '$ GLUCOSE 103*   < > 98 97 113*  BUN 10   < > '12 12 15  '$ CREATININE 0.83   < > 0.99 0.90 1.06*  CALCIUM 8.2*   < > 8.6* 8.8* 9.3  MG 2.2  --  1.9 2.0  --   GFRNONAA >60   < > 55* >60 51*  ANIONGAP 10   < > '7 9 10   '$ < > = values in this interval not displayed.    Lipids No results for input(s): "CHOL", "TRIG", "HDL", "LABVLDL", "LDLCALC", "CHOLHDL" in the last 168 hours.  Hematology Recent Labs  Lab 09/23/21 0103 09/24/21 0332 09/25/21 0247  WBC 6.7 6.8 6.8  RBC 3.53* 3.72* 4.01  HGB 11.1* 11.7* 12.4  HCT 34.0* 36.5 38.2  MCV 96.3 98.1 95.3  MCH 31.4 31.5 30.9  MCHC 32.6 32.1 32.5  RDW 14.9 14.9 14.5  PLT 101*  110* 124*   Thyroid No results for input(s): "TSH", "FREET4" in the last 168 hours.  BNP Recent Labs  Lab 09/21/21 1432  BNP 862.4*    DDimer No results for input(s): "DDIMER" in the last 168 hours.   Radiology    No results found.  Cardiac Studies   Echo from 09/02/20:   1. Left ventricular ejection fraction, by estimation, is 35 to 40%. The  left ventricle has moderately decreased function. The left ventricle  demonstrates global hypokinesis. Left ventricular diastolic parameters are  consistent with Grade II diastolic  dysfunction (pseudonormalization).   2. Right ventricular systolic function is normal. The right ventricular  size is normal. There is moderately elevated pulmonary artery systolic  pressure. The estimated right ventricular systolic pressure is 08.6 mmHg.   3. Left atrial size was moderately dilated.   4. Right atrial size was moderately dilated.   5. The mitral valve is normal in structure. Mild mitral valve  regurgitation. No evidence of mitral stenosis.   6. Tricuspid valve regurgitation is moderate to severe.   7. The aortic valve is normal in structure. There is moderate  calcification of the aortic valve. There is  mild thickening of the aortic  valve. Aortic valve regurgitation is mild. Mild to moderate aortic valve  sclerosis/calcification is present, without  any evidence of aortic stenosis.   8. The inferior vena cava is dilated in size with <50% respiratory  variability, suggesting right atrial pressure of 15 mmHg.   Patient Profile     88 y.o. female with PMH of CAD s/p CABG and PCI to LAD, ischemic CM with EF 35-40%, LBBB, orthostatic hypotension, complete heart block s/p CRT-P, A fib, HTN, HLD, COPD, severe dementia, depression,  cardiology is consulted for syncope and polymorphic  VT since 09/21/21.   Assessment & Plan    Syncope  Polymorphic ventricular tachycardia with Torsades  NSVT Advanced dementia planned for conservative therapy Depression- now off all QT proloning medications - see by EP, JTc similar to 09/27/21 -K is WNL on current therapy - EP recommends no amiodarone, off for 2 days, and metoprolol succinate - no further NSVT  CAD with hx of CABG and LAD PCI  Ischemic CM with EF 35-40% - denied chest pain,  no evidence of ACS, continue conservative approach  Hx of complete heart block -s/p St Jude CRT-PPM, follows EP Dr Caryl Comes    Paroxysmal A fib - s/p CRT-PPM, on eliquis   HTN Orthostatic hypotension - continue ACE and MRA  Depression COPD - per hospitalist   Has 10/20/21 follow up with EP Seroquel and Haldol will be stopped at DC Eliquis down to 2.5 mg  Aldactone and metoprolol and new medications  For questions or updates, please contact [facility] Please consult [email or web address] for contact info under        Signed, Werner Lean, MD  09/26/2021, 7:35 AM

## 2021-09-26 NOTE — TOC Progression Note (Signed)
Transition of Care [facility]) - Progression Note    Patient Details  Name: [name] [name] MRN: [id number] Date of Birth: [birth year]  Transition of [facility] CM/SW [place], LCSW Phone Number:[phone] 09/26/2021, 2:57 PM  Clinical Narrative:     CSW checked pt's authorization and it is still pending.  TOC team will continue to assist with discharge planning needs.        Expected Discharge Plan and Services                                                 Social Determinants of Health (SDOH) Interventions    Readmission Risk Interventions     No data to display

## 2021-09-27 DIAGNOSIS — R112 Nausea with vomiting, unspecified: Secondary | ICD-10-CM

## 2021-09-27 DIAGNOSIS — I4729 Other ventricular tachycardia: Secondary | ICD-10-CM | POA: Diagnosis not present

## 2021-09-27 HISTORY — DX: Nausea with vomiting, unspecified: R11.2

## 2021-09-27 LAB — CBC WITH DIFFERENTIAL/PLATELET
Abs Immature Granulocytes: 0.03 K/uL (ref 0.00–0.07)
Basophils Absolute: 0.1 K/uL (ref 0.0–0.1)
Basophils Relative: 1 %
Eosinophils Absolute: 0.5 K/uL (ref 0.0–0.5)
Eosinophils Relative: 6 %
HCT: 43.3 % (ref 36.0–46.0)
Hemoglobin: 13.7 g/dL (ref 12.0–15.0)
Immature Granulocytes: 0 %
Lymphocytes Relative: 21 %
Lymphs Abs: 1.7 K/uL (ref 0.7–4.0)
MCH: 30.1 pg (ref 26.0–34.0)
MCHC: 31.6 g/dL (ref 30.0–36.0)
MCV: 95.2 fL (ref 80.0–100.0)
Monocytes Absolute: 0.7 K/uL (ref 0.1–1.0)
Monocytes Relative: 9 %
Neutro Abs: 5 K/uL (ref 1.7–7.7)
Neutrophils Relative %: 63 %
Platelets: 166 K/uL (ref 150–400)
RBC: 4.55 MIL/uL (ref 3.87–5.11)
RDW: 14.3 % (ref 11.5–15.5)
WBC: 8 K/uL (ref 4.0–10.5)
nRBC: 0 % (ref 0.0–0.2)

## 2021-09-27 LAB — URINALYSIS, ROUTINE W REFLEX MICROSCOPIC
Bilirubin Urine: NEGATIVE
Glucose, UA: NEGATIVE mg/dL
Hgb urine dipstick: NEGATIVE
Ketones, ur: NEGATIVE mg/dL
Leukocytes,Ua: NEGATIVE
Nitrite: NEGATIVE
Protein, ur: NEGATIVE mg/dL
Specific Gravity, Urine: 1.02 (ref 1.005–1.030)
pH: 6 (ref 5.0–8.0)

## 2021-09-27 LAB — COMPREHENSIVE METABOLIC PANEL
ALT: 17 U/L (ref 0–44)
AST: 16 U/L (ref 15–41)
Albumin: 3.4 g/dL — ABNORMAL LOW (ref 3.5–5.0)
Alkaline Phosphatase: 63 U/L (ref 38–126)
Anion gap: 6 (ref 5–15)
BUN: 28 mg/dL — ABNORMAL HIGH (ref 8–23)
CO2: 25 mmol/L (ref 22–32)
Calcium: 9 mg/dL (ref 8.9–10.3)
Chloride: 106 mmol/L (ref 98–111)
Creatinine, Ser: 1.21 mg/dL — ABNORMAL HIGH (ref 0.44–1.00)
GFR, Estimated: 43 mL/min — ABNORMAL LOW (ref >60)
Glucose, Bld: 110 mg/dL — ABNORMAL HIGH (ref 70–99)
Potassium: 4.6 mmol/L (ref 3.5–5.1)
Sodium: 137 mmol/L (ref 135–145)
Total Bilirubin: 0.9 mg/dL (ref 0.3–1.2)
Total Protein: 5.8 g/dL — ABNORMAL LOW (ref 6.5–8.1)

## 2021-09-27 LAB — GLUCOSE, CAPILLARY
Glucose-Capillary: 122 mg/dL — ABNORMAL HIGH (ref 70–99)
Glucose-Capillary: 127 mg/dL — ABNORMAL HIGH (ref 70–99)
Glucose-Capillary: 134 mg/dL — ABNORMAL HIGH (ref 70–99)
Glucose-Capillary: 149 mg/dL — ABNORMAL HIGH (ref 70–99)

## 2021-09-27 LAB — MAGNESIUM: Magnesium: 2 mg/dL (ref 1.7–2.4)

## 2021-09-27 LAB — PHOSPHORUS: Phosphorus: 4.9 mg/dL — ABNORMAL HIGH (ref 2.5–4.6)

## 2021-09-27 NOTE — Assessment & Plan Note (Signed)
Will discuss events with RN Per PT note

## 2021-09-27 NOTE — Progress Notes (Signed)
PT Cancellation Note  Patient Details Name: [name] [name] MRN: [id number] DOB: January 26, 1932   Cancelled Treatment:    Reason Eval/Treat Not Completed: Other (comment) Pt just returned to bed with RN and was vomiting. Will follow up.  Wyona Almas, PT, DPT Acute Rehabilitation Services Office [place] 09/27/2021, 4:21 PM

## 2021-09-27 NOTE — Progress Notes (Signed)
Progress Note  Patient Name: [name] [name] Date of Encounter: 09/27/2021  [facility] Cardiologist: Virl Axe, MD   Subjective   Increasingly somnolent over weekend, but has improved today. No CP, Sob,.  She is eating with assistance.  Inpatient Medications    Scheduled Meds:  apixaban  2.5 mg Oral BID   cefadroxil  500 mg Oral BID   escitalopram  10 mg Oral Daily   melatonin  5 mg Oral QHS   memantine  5 mg Oral BID   metoprolol succinate  25 mg Oral Daily   OLANZapine  2.5 mg Oral QHS   potassium chloride  10 mEq Oral Daily   pyridostigmine  60 mg Oral Daily   spironolactone  12.5 mg Oral Daily   Continuous Infusions:   PRN Meds: acetaminophen **OR** acetaminophen, hydrALAZINE   Vital Signs    Vitals:   09/26/21 0600 09/26/21 1029 09/26/21 2036 09/27/21 0604  BP: (!) 160/58 (!) 137/91 136/72   Pulse: 70 70 70   Resp:  20 16   Temp: 98.1 F (36.7 C) 97.6 F (36.4 C) 98.2 F (36.8 C)   TempSrc: Axillary Oral Oral   SpO2: 100% 99% 97%   Weight: 58.3 kg   58.1 kg    Intake/Output Summary (Last 24 hours) at 09/27/2021 0739 Last data filed at 09/26/2021 1920 Gross per 24 hour  Intake --  Output 650 ml  Net -650 ml      09/27/2021    6:04 AM 09/26/2021    6:00 AM 09/25/2021    5:00 AM  Last 3 Weights  Weight (lbs) 128 lb 1.4 oz 128 lb 8.5 oz 132 lb 7.9 oz  Weight (kg) 58.1 kg 58.3 kg 60.1 kg      Telemetry    V paced with 2 PVCs (couplet)- Personally Reviewed  ECG    No new - Personally Reviewed  Physical Exam   GEN: No acute distress.   Neck: No JVD Cardiac: RRR, holosystolic murmur heard today, no rubs, or gallops.  Respiratory: Clear to auscultation bilaterally GI: Soft, nontender, non-distended  MS: No leg edema Neuro:  Calm and conversant  Labs    High Sensitivity Troponin:   Recent Labs  Lab 09/21/21 1432 09/21/21 1725 09/21/21 1931 09/21/21 2324  TROPONINIHS 31* 25* 33* 40*     Chemistry Recent Labs  Lab  09/24/21 0332 09/25/21 0247 09/26/21 0132 09/27/21 0208  NA 140 139 138 137  K 4.7 4.0 4.3 4.6  CL 106 104 102 106  CO2 '27 26 26 25  '$ GLUCOSE 98 97 113* 110*  BUN '12 12 15 '$ 28*  CREATININE 0.99 0.90 1.06* 1.21*  CALCIUM 8.6* 8.8* 9.3 9.0  MG 1.9 2.0  --  2.0  PROT  --   --   --  5.8*  ALBUMIN  --   --   --  3.4*  AST  --   --   --  16  ALT  --   --   --  17  ALKPHOS  --   --   --  63  BILITOT  --   --   --  0.9  GFRNONAA 55* >60 51* 43*  ANIONGAP '7 9 10 6    '$ Lipids No results for input(s): "CHOL", "TRIG", "HDL", "LABVLDL", "LDLCALC", "CHOLHDL" in the last 168 hours.  Hematology Recent Labs  Lab 09/24/21 0332 09/25/21 0247 09/27/21 0208  WBC 6.8 6.8 8.0  RBC 3.72* 4.01 4.55  HGB 11.7* 12.4  13.7  HCT 36.5 38.2 43.3  MCV 98.1 95.3 95.2  MCH 31.5 30.9 30.1  MCHC 32.1 32.5 31.6  RDW 14.9 14.5 14.3  PLT 110* 124* 166   Thyroid No results for input(s): "TSH", "FREET4" in the last 168 hours.  BNP Recent Labs  Lab 09/21/21 1432  BNP 862.4*    DDimer No results for input(s): "DDIMER" in the last 168 hours.   Radiology    No results found.  Cardiac Studies   Echo from 09/02/20:   1. Left ventricular ejection fraction, by estimation, is 35 to 40%. The  left ventricle has moderately decreased function. The left ventricle  demonstrates global hypokinesis. Left ventricular diastolic parameters are  consistent with Grade II diastolic  dysfunction (pseudonormalization).   2. Right ventricular systolic function is normal. The right ventricular  size is normal. There is moderately elevated pulmonary artery systolic  pressure. The estimated right ventricular systolic pressure is 48.8 mmHg.   3. Left atrial size was moderately dilated.   4. Right atrial size was moderately dilated.   5. The mitral valve is normal in structure. Mild mitral valve  regurgitation. No evidence of mitral stenosis.   6. Tricuspid valve regurgitation is moderate to severe.   7. The aortic valve  is normal in structure. There is moderate  calcification of the aortic valve. There is mild thickening of the aortic  valve. Aortic valve regurgitation is mild. Mild to moderate aortic valve  sclerosis/calcification is present, without  any evidence of aortic stenosis.   8. The inferior vena cava is dilated in size with <50% respiratory  variability, suggesting right atrial pressure of 15 mmHg.   Patient Profile     88 y.o. female with PMH of CAD s/p CABG and PCI to LAD, ischemic CM with EF 35-40%, LBBB, orthostatic hypotension, complete heart block s/p CRT-P, A fib, HTN, HLD, COPD, severe dementia, depression,  cardiology is consulted for syncope and polymorphic  VT since 09/21/21.   Assessment & Plan    Syncope  Polymorphic ventricular tachycardia with Torsades  NSVT Advanced dementia planned for conservative therapy Depression and somnolence - Keep off Qtc proloning medication -K is WNL on current therapy, Creatinine increase today may be related to decrease PO intake from this weekend (she has been very somnolent) - EP recommends no amiodarone and metoprolol succinate (started on succinate 25 mg Po daily)  CAD with hx of CABG and LAD PCI  Ischemic CM with EF 35-40% - continue conservative approach  Hx of complete heart block -s/p St Jude CRT-PPM, follows EP Dr Caryl Comes    Paroxysmal A fib - s/p CRT-PPM, on eliquis 2.5 after EP discussion  HTN Orthostatic hypotension - continue ACE and MRA   COPD - per hospitalist   Has 10/20/21 follow up with EP  Seroquel and Haldol will be stopped at DC Eliquis down to 2.5 mg  Aldactone and metoprolol and new medications  For questions or updates, please contact [facility] Please consult [email or web address] for contact info under        Signed, Werner Lean, MD  09/27/2021, 7:39 AM

## 2021-09-27 NOTE — Progress Notes (Signed)
PROGRESS NOTE    [name] [name]  HLK:[id number] DOB: 02/21/1932 DOA: 09/21/2021 PCP: System, Provider Not In  Chief Complaint  Patient presents with   Seizures    Brief Narrative:  89/F with advanced dementia from memory care unit, history of COPD, CAD with PCI, chronic systolic CHF, orthostatic hypotension who was brought to the ED earlier in the day on 6/12 after an unwitnessed fall, she was discharged and presented back to the ED same evening with possible seizure-like activity with loss of consciousness. -In the emergency room patient was noted to be in polymorphic VT, cardiology was consulted and she was started on amiodarone infusion -Overnight EEG without seizure activity    Assessment & Plan:   Principal Problem:   Polymorphic ventricular tachycardia ([place]) Active Problems:   Dementia with behavioral disturbance (HCC)   Nausea & vomiting   Syncope   Laceration of left upper arm   Hypokalemia   Seizure-like activity (HCC)   Hypomagnesemia   HFrEF (heart failure with reduced ejection fraction) (HCC)   Paroxysmal atrial fibrillation (HCC)   Asymptomatic bacteriuria   Essential hypertension   CAD (coronary artery disease)   DNR (do not resuscitate)   Assessment and Plan: * Polymorphic ventricular tachycardia ([place]) Appreciate cardiology assistance Recommending transitioning to aldactone, replace lytes Will continue to follow QTc (prolonged, V paced) EP consulted -> recommended d/c amio/seroquel, start aldcatone - they thought cause was volume overload, electrolyte abnormalities, and QT prolonging meds Appreciate cardiology recs - metoprolol, aldactone lexapro and zyprexa decreased  Nausea & vomiting Will discuss events with RN Per PT note  Dementia with behavioral disturbance ([place]) Reduce lexapro, reduce zyprexa discussed with pharmacy [facility] [place] for palliative outpatient More awake today, closer to how she was the first day or so I saw her.  Will  follow closely.  Delirium precautions.  Continue to get her out of bed if possible/safe.  Syncope In setting of torsades, above Hx orthostatic hypotension Follow orthostatics (unable to stand this morning for orthostatics, but she did walk 70 feet with RW with therapy this AM), PT/OT She mentioned LH to me yesterday, unclear today, will follow orthostatics and therapy eval    Laceration of left upper arm 3 sutures placed to LUE, prolene Needs removal in follow up (placed 6/12) Plan for 7 days  Hypomagnesemia Goal >2  Seizure-like activity (HCC) Due to syncope above, convulsive syncope Appreciate neurology recs EEG without seizures or epileptiform discharges Tramadol d/c'd  Hypokalemia Goal >4  Paroxysmal atrial fibrillation ([place]) Initially was on hold, but after discussion with Dr. Caryl Comes, planning to resume this at reduced dose 2.5 mg BID  HFrEF (heart failure with reduced ejection fraction) ([place]) Appears euvolemic, continue home meds Echo 08/2020 with EF 35-40%  Asymptomatic bacteriuria Noted, follow Afebrile with normal wbc count After discussion, started on duricef, repeat UA/cx pending (repeat UA not c/w UTI), will plan for short course      DVT prophylaxis: eliquis Code Status: dnr Family Communication: [name] 6/14 Disposition:   Status is: Inpatient Remains inpatient appropriate because: need for additional w/u, safe d/c plan   Consultants:  Neurology cardiology  Procedures:  none  Antimicrobials:  Anti-infectives (From admission, onward)    Start     Dose/Rate Route Frequency Ordered Stop   09/26/21 2200  cefadroxil (DURICEF) capsule 500 mg        500 mg Oral 2 times daily 09/26/21 1758     09/22/21 2200  cefTRIAXone (ROCEPHIN) 1 g in sodium chloride 0.9 %  100 mL IVPB  Status:  Discontinued        1 g 200 mL/hr over 30 Minutes Intravenous Every 24 hours 09/22/21 0200 09/22/21 1101   09/22/21 0215  cefTRIAXone (ROCEPHIN) 1 g in sodium  chloride 0.9 % 100 mL IVPB        1 g 200 mL/hr over 30 Minutes Intravenous  Once 09/22/21 0203 09/22/21 0305       Subjective: More awake today, pleasantly confused  Objective: Vitals:   09/26/21 0600 09/26/21 1029 09/26/21 2036 09/27/21 0604  BP: (!) 160/58 (!) 137/91 136/72   Pulse: 70 70 70   Resp:  20 16   Temp: 98.1 F (36.7 C) 97.6 F (36.4 C) 98.2 F (36.8 C)   TempSrc: Axillary Oral Oral   SpO2: 100% 99% 97%   Weight: 58.3 kg   58.1 kg    Intake/Output Summary (Last 24 hours) at 09/27/2021 1630 Last data filed at 09/26/2021 1920 Gross per 24 hour  Intake --  Output 650 ml  Net -650 ml   Filed Weights   09/25/21 0500 09/26/21 0600 09/27/21 0604  Weight: 60.1 kg 58.3 kg 58.1 kg    Examination: General: No acute distress. Cardiovascular: RRR Lungs: unlabored Neurological: pleasantly confused, moving all extremities Extremities: No clubbing or cyanosis. No edema.   Data Reviewed: I have personally reviewed following labs and imaging studies  CBC: Recent Labs  Lab 09/21/21 1931 09/21/21 2001 09/22/21 0441 09/23/21 0103 09/24/21 0332 09/25/21 0247 09/27/21 0208  WBC 10.7*  --  9.6 6.7 6.8 6.8 8.0  NEUTROABS 8.1*  --  7.8*  --  4.5 4.6 5.0  HGB 12.2   < > 11.7* 11.1* 11.7* 12.4 13.7  HCT 40.2   < > 37.0 34.0* 36.5 38.2 43.3  MCV 99.0  --  97.4 96.3 98.1 95.3 95.2  PLT 128*  --  112* 101* 110* 124* 166   < > = values in this interval not displayed.    Basic Metabolic Panel: Recent Labs  Lab 09/22/21 0441 09/23/21 0103 09/23/21 1330 09/24/21 0332 09/25/21 0247 09/26/21 0132 09/27/21 0208  NA 140 143 140 140 139 138 137  K 4.0 3.1* 3.9 4.7 4.0 4.3 4.6  CL 106 104 104 106 104 102 106  CO2 '25 29 24 27 26 26 25  '$ GLUCOSE 118* 103* 91 98 97 113* 110*  BUN '11 10 10 12 12 15 '$ 28*  CREATININE 0.84 0.83 0.82 0.99 0.90 1.06* 1.21*  CALCIUM 8.3* 8.2* 8.3* 8.6* 8.8* 9.3 9.0  MG 1.7 2.2  --  1.9 2.0  --  2.0  PHOS  --   --   --   --  4.5  --  4.9*     GFR: CrCl cannot be calculated (Unknown ideal weight.).  Liver Function Tests: Recent Labs  Lab 09/27/21 0208  AST 16  ALT 17  ALKPHOS 63  BILITOT 0.9  PROT 5.8*  ALBUMIN 3.4*    CBG: Recent Labs  Lab 09/26/21 0555 09/26/21 1201 09/26/21 2033 09/27/21 0015 09/27/21 0601  GLUCAP 124* 115* 132* 149* 127*     Recent Results (from the past 240 hour(s))  SARS Coronavirus 2 by RT PCR (hospital order, performed in [facility] hospital lab) *cepheid single result test* Anterior Nasal Swab     Status: None   Collection Time: 09/21/21  2:39 PM   Specimen: Anterior Nasal Swab  Result Value Ref Range Status   SARS Coronavirus 2 by RT PCR NEGATIVE  NEGATIVE Final    Comment: (NOTE) SARS-CoV-2 target nucleic acids are NOT DETECTED.  The SARS-CoV-2 RNA is generally detectable in upper and lower respiratory specimens during the acute phase of infection. The lowest concentration of SARS-CoV-2 viral copies this assay can detect is 250 copies / mL. Jantzen Pilger negative result does not preclude SARS-CoV-2 infection and should not be used as the sole basis for treatment or other patient management decisions.  Shawon Denzer negative result may occur with improper specimen collection / handling, submission of specimen other than nasopharyngeal swab, presence of viral mutation(s) within the areas targeted by this assay, and inadequate number of viral copies (<250 copies / mL). Naleigha Raimondi negative result must be combined with clinical observations, patient history, and epidemiological information.  Fact Sheet for Patients:   https://www.patel.info/  Fact Sheet for Healthcare Providers: https://hall.com/  This test is not yet approved or  cleared by the [place] FDA and has been authorized for detection and/or diagnosis of SARS-CoV-2 by FDA under an Emergency Use Authorization (EUA).  This EUA will remain in effect (meaning this test can be used) for the duration  of the COVID-19 declaration under Section 564(b)(1) of the Act, 21 U.S.C. section 360bbb-3(b)(1), unless the authorization is terminated or revoked sooner.  Performed at [facility], [place] [address], [place], [place] [address]   Urine Culture     Status: Abnormal   Collection Time: 09/22/21  2:17 AM   Specimen: Urine, Clean Catch  Result Value Ref Range Status   Specimen Description URINE, CLEAN CATCH  Final   Special Requests   Final    NONE Performed at [facility], [place] [address], [place], [place] [address]    Culture 30,000 COLONIES/mL KLEBSIELLA PNEUMONIAE (Avayah Raffety)  Final   Report Status 09/24/2021 FINAL  Final   Organism ID, Bacteria KLEBSIELLA PNEUMONIAE (Gwynneth Fabio)  Final      Susceptibility   Klebsiella pneumoniae - MIC*    AMPICILLIN RESISTANT Resistant     CEFAZOLIN <=4 SENSITIVE Sensitive     CEFEPIME <=0.12 SENSITIVE Sensitive     CEFTRIAXONE <=0.25 SENSITIVE Sensitive     CIPROFLOXACIN <=0.25 SENSITIVE Sensitive     GENTAMICIN <=1 SENSITIVE Sensitive     IMIPENEM 0.5 SENSITIVE Sensitive     NITROFURANTOIN 64 INTERMEDIATE Intermediate     TRIMETH/SULFA <=20 SENSITIVE Sensitive     AMPICILLIN/SULBACTAM <=2 SENSITIVE Sensitive     PIP/TAZO <=4 SENSITIVE Sensitive     * 30,000 COLONIES/mL KLEBSIELLA PNEUMONIAE         Radiology Studies: No results found.      Scheduled Meds:  apixaban  2.5 mg Oral BID   cefadroxil  500 mg Oral BID   escitalopram  10 mg Oral Daily   melatonin  5 mg Oral QHS   memantine  5 mg Oral BID   metoprolol succinate  25 mg Oral Daily   OLANZapine  2.5 mg Oral QHS   potassium chloride  10 mEq Oral Daily   pyridostigmine  60 mg Oral Daily   spironolactone  12.5 mg Oral Daily   Continuous Infusions:   LOS: 6 days    Time spent: over 30 min    Fayrene Helper, MD [facility]   To contact the attending provider between 7A-7P or the covering provider during after hours 7P-7A, please log into the web site  [email or web address] and access using universal [place] password for that web site. If you do not have the password, please call the hospital  operator.  09/27/2021, 4:30 PM

## 2021-09-27 NOTE — Plan of Care (Signed)

## 2021-09-28 DIAGNOSIS — N179 Acute kidney failure, unspecified: Secondary | ICD-10-CM

## 2021-09-28 LAB — COMPREHENSIVE METABOLIC PANEL
ALT: 15 U/L (ref 0–44)
AST: 15 U/L (ref 15–41)
Albumin: 3.2 g/dL — ABNORMAL LOW (ref 3.5–5.0)
Alkaline Phosphatase: 61 U/L (ref 38–126)
Anion gap: 8 (ref 5–15)
BUN: 31 mg/dL — ABNORMAL HIGH (ref 8–23)
CO2: 25 mmol/L (ref 22–32)
Calcium: 9 mg/dL (ref 8.9–10.3)
Chloride: 104 mmol/L (ref 98–111)
Creatinine, Ser: 1.24 mg/dL — ABNORMAL HIGH (ref 0.44–1.00)
GFR, Estimated: 42 mL/min — ABNORMAL LOW (ref >60)
Glucose, Bld: 102 mg/dL — ABNORMAL HIGH (ref 70–99)
Potassium: 4.3 mmol/L (ref 3.5–5.1)
Sodium: 137 mmol/L (ref 135–145)
Total Bilirubin: 1.1 mg/dL (ref 0.3–1.2)
Total Protein: 5.8 g/dL — ABNORMAL LOW (ref 6.5–8.1)

## 2021-09-28 LAB — CBC WITH DIFFERENTIAL/PLATELET
Abs Immature Granulocytes: 0.04 10*3/uL (ref 0.00–0.07)
Basophils Absolute: 0 10*3/uL (ref 0.0–0.1)
Basophils Relative: 1 %
Eosinophils Absolute: 0.4 10*3/uL (ref 0.0–0.5)
Eosinophils Relative: 5 %
HCT: 43.4 % (ref 36.0–46.0)
Hemoglobin: 14.3 g/dL (ref 12.0–15.0)
Immature Granulocytes: 1 %
Lymphocytes Relative: 23 %
Lymphs Abs: 1.9 10*3/uL (ref 0.7–4.0)
MCH: 30.9 pg (ref 26.0–34.0)
MCHC: 32.9 g/dL (ref 30.0–36.0)
MCV: 93.7 fL (ref 80.0–100.0)
Monocytes Absolute: 0.6 10*3/uL (ref 0.1–1.0)
Monocytes Relative: 8 %
Neutro Abs: 5.1 10*3/uL (ref 1.7–7.7)
Neutrophils Relative %: 62 %
Platelets: 168 10*3/uL (ref 150–400)
RBC: 4.63 MIL/uL (ref 3.87–5.11)
RDW: 14.2 % (ref 11.5–15.5)
WBC: 8 10*3/uL (ref 4.0–10.5)
nRBC: 0 % (ref 0.0–0.2)

## 2021-09-28 LAB — PHOSPHORUS: Phosphorus: 4.9 mg/dL — ABNORMAL HIGH (ref 2.5–4.6)

## 2021-09-28 LAB — MAGNESIUM: Magnesium: 2.1 mg/dL (ref 1.7–2.4)

## 2021-09-28 LAB — GLUCOSE, CAPILLARY
Glucose-Capillary: 124 mg/dL — ABNORMAL HIGH (ref 70–99)
Glucose-Capillary: 141 mg/dL — ABNORMAL HIGH (ref 70–99)
Glucose-Capillary: 141 mg/dL — ABNORMAL HIGH (ref 70–99)
Glucose-Capillary: 96 mg/dL (ref 70–99)

## 2021-09-28 NOTE — Discharge Summary (Signed)
Physician Discharge Summary  [name] [name] BPZ:[id number] DOB: February 01, 1932 DOA: 09/21/2021  PCP: System, Provider Not In  Admit date: 09/21/2021 Discharge date: 09/29/2021  Time spent: 40 minutes  Recommendations for Outpatient Follow-up:  Follow outpatient CBC/CMP/magnesium within 3 days Outpatient palliative care referral Reevaluate left upper extremity laceration in 1-2 days for suture removal (wasn't ready on 6/19) - may need removal followed by wound care Lexapro decreased to 10 mg, consider gradual taper of this per outpatient provider depending on how she does over next few weeks on this lower dose She's been started on zyprexa (in place of seroquel) adjust dose going forward as needed - given issues with torsades would taper/discontinue this in the future if possible  Continue to assist with meals at SNF (cut up food, offer assistance as needed) Attention to kidney function and potassium outpatient (mild increase in the setting of spironolactone being added in house, may need adjustment going forward?)  Holding potassium supplementation on discharge, consider resuming in future with goal K>4 Follow delirium outpatient - improved, continue delirium precautions Continue therapy at SNF for deconditioning On low dose eliquis for afib Follow final urine cx, given dose of fosfomycin prior to discharge Follow with cardiology outpatient  Discharge Diagnoses:  Principal Problem:   Polymorphic ventricular tachycardia ([place]) Active Problems:   Dementia with behavioral disturbance (HCC)   Nausea & vomiting   Syncope   Laceration of left upper arm   Hypokalemia   Seizure-like activity (HCC)   Hypomagnesemia   AKI (acute kidney injury) ([place])   HFrEF (heart failure with reduced ejection fraction) (HCC)   Paroxysmal atrial fibrillation ([place])   Asymptomatic bacteriuria   Essential hypertension   CAD (coronary artery disease)   DNR (do not resuscitate)   Discharge Condition:  stable  Diet recommendation: heart healthy  Filed Weights   09/26/21 0600 09/27/21 0604 09/28/21 0452  Weight: 58.3 kg 58.1 kg 57.7 kg    History of present illness:  89/F with advanced dementia from memory care unit, history of COPD, CAD with PCI, chronic systolic CHF, orthostatic hypotension who was brought to the ED earlier in the day on 6/12 after an unwitnessed fall, she was discharged and presented back to the ED same evening with possible seizure-like activity with loss of consciousness.  In the emergency room patient was noted to be in polymorphic VT, cardiology was consulted and she was started on amiodarone infusion.  Neurology was consulted and recommended correcting metabolic derangements, hydration, consider compression stockings.  Cardiology has followed and EP consulted.  PMVT thought due to QT prolonging meds and hypokalemia.  She's now been started on metoprolol and spironolactone.  Her amiodarone was d/c'd and her haldol and seroquel were discontinued as well.  The lexapro has been decreased and she's been started on low dose zyprexa.    Her hospitalization has been complicated by delirium and deconditioning.  Plan at this point is discharge to SNF prior to going back to memory care.  See below for additional details  Hospital Course:  Assessment and Plan: * Polymorphic ventricular tachycardia ([place]) Appreciate cardiology assistance Recommending transitioning to aldactone, replace lytes Will continue to follow QTc (prolonged, V paced) EP consulted -> recommended d/c amio/seroquel, start aldcatone - they thought cause was volume overload, electrolyte abnormalities, and QT prolonging meds Mag/K at goal today Appreciate cardiology recs - start metoprolol, start aldactone. stop haldol and seroquel at d/c. lexapro and zyprexa decreased.  Follow with PCP outpatient or provider at SNF regarding lexapro taper long term  as well as eventual taper/dc of zyprexa if able.  They'd also  recommended potassium supplementation, but K 4.8 today, will hold 10 meq potassium daily -> review for need in future.  Nausea & vomiting Resolved, occurred x1 after eating  Dementia with behavioral disturbance (HCC) Reduce lexapro, reduce zyprexa discussed with pharmacy [facility] [place] for palliative outpatient Remains pleasantly confused.  Needs assistance with meals at this time - please continue to cut up food, offer assistance with meals as needed at SNF.  Delirium precautions.  Continue PT/OT, OOB.  Syncope In setting of torsades, above Hx orthostatic hypotension She's deconditioned after this hospitalization.  Plan for follow up at SNF for rehab.     Laceration of left upper arm 3 sutures placed to LUE, prolene Needs removal in follow up (placed 6/12) Evaluated for removal on 6/19.  Don't appear ready to come out, will leave for reevaluation of removal in 1-2 days.  Continue nonstick dressing over laceration, wrapped with kerlix.  AKI (acute kidney injury) ([place]) Mild, continue to monitor, improving Repeat labs in 2-3 days  Hypomagnesemia Goal >2  Seizure-like activity (HCC) Due to syncope above, convulsive syncope Appreciate neurology recs EEG without seizures or epileptiform discharges Tramadol d/c'd  Hypokalemia Goal >4  Paroxysmal atrial fibrillation (HCC) Initially was on hold, but after discussion with Dr. Caryl Comes, planning to resume this at reduced dose 2.5 mg BID  HFrEF (heart failure with reduced ejection fraction) ([place]) Appears euvolemic, continue home meds Echo 08/2020 with EF 35-40%  Asymptomatic bacteriuria Noted, follow Afebrile with normal wbc count Initial urine cx with klebsiella.  Repeat growing enterococcus. Dose of fosfomycin given prior to d/c. Follow final cx results      Procedures: Laceration repair This study is suggestive of mild to moderate diffuse encephalopathy, nonspecific etiology. No seizures or epileptiform  discharges were seen throughout the recording.  Consultations: Cardiology EP  Discharge Exam: Vitals:   09/29/21 0635 09/29/21 0923  BP: 113/66 106/71  Pulse: 70 70  Resp: 19   Temp: 98.5 F (36.9 C)   SpO2: 98%    Pleasantly confused  General: No acute distress. Cardiovascular: RRR Lungs: unlabored Abdomen: Soft, nontender, nondistended Neurological: moving all extremities Extremities: No clubbing or cyanosis. No edema.   Discharge Instructions   Discharge Instructions     Call MD for:  difficulty breathing, headache or visual disturbances   Complete by: As directed    Call MD for:  extreme fatigue   Complete by: As directed    Call MD for:  hives   Complete by: As directed    Call MD for:  persistant dizziness or light-headedness   Complete by: As directed    Call MD for:  persistant nausea and vomiting   Complete by: As directed    Call MD for:  redness, tenderness, or signs of infection (pain, swelling, redness, odor or green/yellow discharge around incision site)   Complete by: As directed    Call MD for:  severe uncontrolled pain   Complete by: As directed    Call MD for:  temperature >100.4   Complete by: As directed    Diet - low sodium heart healthy   Complete by: As directed    Discharge instructions   Complete by: As directed    You were admitted for an episode of loss of consciousness due to torsades de pointes.    You were seen by cardiology and the EP doctors.  We've adjusted your medicines.  You've been started  on metoprolol 25 mg twice daily, we've stopped your haldol and seroquel.  We've decreased your lexapro to 10 mg daily and started you on zyprexa 2.5 mg nightly.  You've also been started on spironolactone.  They also recommended potassium supplementation with 10 meq daily, I'm going to hold this until you get repeat labs because your potassium was 4.8 today.  Please follow up with cardiology as an outpatient.  Get repeat labs within 1-2  weeks to follow up on your electrolytes (magnesium and potassium) as well as your creatinine (kidney function, your creatinine was higher today than it's been on prior days).  You're hospitalization was complicated by delirium.  I think this was due to the hospital stay itself.  You had Ahliya Glatt UTI when you presented.  We treated you with Nakya Weyand short course of antibiotics.  I suspect your infection was unrelated to the confusion, but due to the persistence, we did treat this.  Your hospitalization was also complicated by physical deconditioning.  I think this is due to your being bedbound for so many days in the hospital.  Marya Amsler going to discharge you to SNF for rehab to get stronger before going back to your memory care.  Return for new, recurrent, or worsening symptoms.  Please ask your PCP to request records from this hospitalization so they know what was done and what the next steps will be.   Increase activity slowly   Complete by: As directed    No wound care   Complete by: As directed       Allergies as of 09/29/2021       Reactions   Aricept [donepezil] Shortness Of Breath, Other (See Comments)   Hallucinations and loss of sensation in legs and "Allergic," per [facility]   Nitroglycerin Other (See Comments)   Oral spray form causes rapid drop in blood pressure. Patient is sensitive to tabs which causes rapid drop in blood pressure. Is ok to use oral spray form. "Allergic," per [facility]        Medication List     STOP taking these medications    haloperidol 0.5 MG tablet Commonly known as: HALDOL   lisinopril 5 MG tablet Commonly known as: ZESTRIL   QUEtiapine 25 MG tablet Commonly known as: SEROQUEL   traMADol 50 MG tablet Commonly known as: ULTRAM       TAKE these medications    apixaban 2.5 MG Tabs tablet Commonly known as: ELIQUIS Take 1 tablet (2.5 mg total) by mouth 2 (two) times daily. What changed:  medication strength See the new instructions.   escitalopram 10 MG  tablet Commonly known as: LEXAPRO Take 1 tablet (10 mg total) by mouth daily. What changed:  medication strength See the new instructions.   Melatonin 10 MG Tabs Take 10 mg by mouth at bedtime.   memantine 5 MG tablet Commonly known as: NAMENDA Take 5 mg by mouth in the morning and at bedtime.   metoprolol succinate 25 MG 24 hr tablet Commonly known as: TOPROL-XL Take 1 tablet (25 mg total) by mouth daily.   OLANZapine 2.5 MG tablet Commonly known as: ZYPREXA Take 1 tablet (2.5 mg total) by mouth at bedtime.   pyridostigmine 60 MG tablet Commonly known as: MESTINON TAKE 1 TABLET(60 MG) BY MOUTH DAILY What changed:  how much to take how to take this when to take this additional instructions   spironolactone 25 MG tablet Commonly known as: ALDACTONE Take 0.5 tablets (12.5 mg total) by mouth daily.  TYLENOL 500 MG tablet Generic drug: acetaminophen Take 1,000 mg by mouth in the morning and at bedtime.   Vitamin D3 Super Strength 50 MCG (2000 UT) Tabs Generic drug: Cholecalciferol Take 2,000 Units by mouth daily.       Allergies  Allergen Reactions   Aricept [Donepezil] Shortness Of Breath and Other (See Comments)    Hallucinations and loss of sensation in legs and "Allergic," per [facility]   Nitroglycerin Other (See Comments)    Oral spray form causes rapid drop in blood pressure. Patient is sensitive to tabs which causes rapid drop in blood pressure. Is ok to use oral spray form. "Allergic," per [facility]      The results of significant diagnostics from this hospitalization (including imaging, microbiology, ancillary and laboratory) are listed below for reference.    Significant Diagnostic Studies: EEG adult  Result Date: 10-09-21 Lora Havens, MD     2021-10-09  8:18 AM Patient Name: [name] [name] MRN: [id number] Epilepsy Attending: Lora Havens Referring Physician/Provider: Kerney Elbe, MD Date: 09/21/2021 Duration: 21.11 mins Patient history: 88 year old  female with Geniece Akers history of dementia who presented twice to the ED today, initially for evaluation after an unwitnessed fall versus syncope, the second visit after seizure-like activity was witnessed by family while on the way home following the first evaluation. EEG to evaluate for seizure Level of alertness: Awake, asleep AEDs during EEG study: None Technical aspects: This EEG study was done with scalp electrodes positioned according to the 10-20 International system of electrode placement. Electrical activity was acquired at [place] sampling rate of '500Hz'$  and reviewed with Taryn Nave high frequency filter of '70Hz'$  and Khamari Sheehan low frequency filter of '1Hz'$ . EEG data were recorded continuously and digitally stored. Description: The posterior dominant rhythm consists of 7 Hz activity of moderate voltage (25-35 uV) seen predominantly in posterior head regions, symmetric and reactive to eye opening and eye closing. Sleep was characterized by vertex waves, sleep spindles (12 to 14 Hz), maximal frontocentral region.  EEG showed continuous generalized 3 to 6 Hz theta-delta slowing. Hyperventilation and photic stimulation were not performed.   ABNORMALITY - Continuous slow, generalized IMPRESSION: This study is suggestive of mild to moderate diffuse encephalopathy, nonspecific etiology. No seizures or epileptiform discharges were seen throughout the recording. Lora Havens   DG Chest Port 1 View  Result Date: 09/21/2021 CLINICAL DATA:  Chest pain EXAM: PORTABLE CHEST 1 VIEW COMPARISON:  09/21/2021 FINDINGS: Cardiomegaly, vascular congestion. Interstitial prominence may reflect interstitial edema. No effusions or acute bony abnormality. Prior CABG.  Left pacer remains in place, unchanged. IMPRESSION: Cardiomegaly with vascular congestion. Possible early interstitial edema. Electronically Signed   By: Rolm Baptise M.D.   On: 09/21/2021 20:58   CT Head Wo Contrast  Result Date: 09/21/2021 CLINICAL DATA:  Headache, trauma EXAM: CT HEAD  WITHOUT CONTRAST CT CERVICAL SPINE WITHOUT CONTRAST TECHNIQUE: Multidetector CT imaging of the head and cervical spine was performed following the standard protocol without intravenous contrast. Multiplanar CT image reconstructions of the cervical spine were also generated. RADIATION DOSE REDUCTION: This exam was performed according to the departmental dose-optimization program which includes automated exposure control, adjustment of the mA and/or kV according to patient size and/or use of iterative reconstruction technique. COMPARISON:  CT head and cervical spine 09/01/2020 FINDINGS: CT HEAD FINDINGS Brain: There is no evidence of acute intracranial hemorrhage, extra-axial fluid collection, or acute infarct. There is mild-to-moderate global parenchymal volume loss with prominence of the ventricular system and extra-axial CSF spaces,  not significantly changed since the prior study from 2022. Patchy and confluent hypodensity throughout the subcortical and periventricular white matter likely reflects sequela of advanced chronic white matter microangiopathy. Herminio Kniskern probable small calcified meningioma overlying the right frontal lobe is unchanged. There is no other solid mass lesion. There is no mass effect or midline shift. Vascular: There is calcification of the bilateral cavernous ICAs. Skull: Normal. Negative for fracture or focal lesion. Sinuses/Orbits: The imaged paranasal sinuses are clear. Bilateral lens implants are in place. The globes and orbits are otherwise unremarkable. Other: None. CT CERVICAL SPINE FINDINGS Alignment: There is slight reversal of the normal cervical lordosis centered at C5, unchanged. There is no antero or retrolisthesis. There is no jumped or perched facets or other evidence of traumatic malalignment. Skull base and vertebrae: Skull base alignment is maintained. Vertebral body heights are preserved. There is no evidence of acute fracture. Ankylosis of the facet joints at C2 through C4 is  unchanged. Soft tissues and spinal canal: No prevertebral fluid or swelling. No visible canal hematoma. Disc levels: Multilevel disc space narrowing and degenerative endplate changes again seen, most advanced at C4-C5 and C5-C6. Multilevel facet arthropathy is also similar to the prior study. There is no evidence of high-grade spinal canal stenosis. Upper chest: The imaged lung apices are clear. Other: None. IMPRESSION: 1. No acute intracranial pathology. 2. No acute fracture or traumatic malalignment of the cervical spine. Electronically Signed   By: [name] M.D.   On: 09/21/2021 14:14   CT Cervical Spine Wo Contrast  Result Date: 09/21/2021 CLINICAL DATA:  Headache, trauma EXAM: CT HEAD WITHOUT CONTRAST CT CERVICAL SPINE WITHOUT CONTRAST TECHNIQUE: Multidetector CT imaging of the head and cervical spine was performed following the standard protocol without intravenous contrast. Multiplanar CT image reconstructions of the cervical spine were also generated. RADIATION DOSE REDUCTION: This exam was performed according to the departmental dose-optimization program which includes automated exposure control, adjustment of the mA and/or kV according to patient size and/or use of iterative reconstruction technique. COMPARISON:  CT head and cervical spine 09/01/2020 FINDINGS: CT HEAD FINDINGS Brain: There is no evidence of acute intracranial hemorrhage, extra-axial fluid collection, or acute infarct. There is mild-to-moderate global parenchymal volume loss with prominence of the ventricular system and extra-axial CSF spaces, not significantly changed since the prior study from 2022. Patchy and confluent hypodensity throughout the subcortical and periventricular white matter likely reflects sequela of advanced chronic white matter microangiopathy. Swanson Farnell probable small calcified meningioma overlying the right frontal lobe is unchanged. There is no other solid mass lesion. There is no mass effect or midline shift.  Vascular: There is calcification of the bilateral cavernous ICAs. Skull: Normal. Negative for fracture or focal lesion. Sinuses/Orbits: The imaged paranasal sinuses are clear. Bilateral lens implants are in place. The globes and orbits are otherwise unremarkable. Other: None. CT CERVICAL SPINE FINDINGS Alignment: There is slight reversal of the normal cervical lordosis centered at C5, unchanged. There is no antero or retrolisthesis. There is no jumped or perched facets or other evidence of traumatic malalignment. Skull base and vertebrae: Skull base alignment is maintained. Vertebral body heights are preserved. There is no evidence of acute fracture. Ankylosis of the facet joints at C2 through C4 is unchanged. Soft tissues and spinal canal: No prevertebral fluid or swelling. No visible canal hematoma. Disc levels: Multilevel disc space narrowing and degenerative endplate changes again seen, most advanced at C4-C5 and C5-C6. Multilevel facet arthropathy is also similar to the prior study. There is  no evidence of high-grade spinal canal stenosis. Upper chest: The imaged lung apices are clear. Other: None. IMPRESSION: 1. No acute intracranial pathology. 2. No acute fracture or traumatic malalignment of the cervical spine. Electronically Signed   By: [name] M.D.   On: 09/21/2021 14:14   DG Shoulder Left  Result Date: 09/21/2021 CLINICAL DATA:  Status post fall, shoulder pain EXAM: LEFT SHOULDER - 2+ VIEW COMPARISON:  None Available. FINDINGS: There is no evidence of fracture or dislocation. There is no evidence of arthropathy or other focal bone abnormality. Soft tissues are unremarkable. IMPRESSION: Negative. Electronically Signed   By: Kathreen Devoid M.D.   On: 09/21/2021 13:51   DG Chest 1 View  Result Date: 09/21/2021 CLINICAL DATA:  Status post fall.  History of AFib. EXAM: CHEST  1 VIEW COMPARISON:  09/01/2020 FINDINGS: Bilateral mild interstitial thickening. No focal consolidation. No pleural effusion  or pneumothorax. Enlarged central pulmonary vasculature. Stable cardiomegaly. Prior CABG. Dual lead cardiac pacemaker. No acute osseous abnormality. IMPRESSION: 1. Cardiomegaly with mild pulmonary vascular congestion. Electronically Signed   By: Kathreen Devoid M.D.   On: 09/21/2021 13:50   DG Pelvis 1-2 Views  Result Date: 09/21/2021 CLINICAL DATA:  Status post fall. EXAM: PELVIS - 1-2 VIEW COMPARISON:  None Available. FINDINGS: Generalized osteopenia. No acute fracture or dislocation. Right hip arthroplasty. No aggressive osseous lesion. Normal alignment. Soft tissue are unremarkable. No radiopaque foreign body or soft tissue emphysema. IMPRESSION: 1. No acute osseous injury of the pelvis. Electronically Signed   By: Kathreen Devoid M.D.   On: 09/21/2021 13:49    Microbiology: Recent Results (from the past 240 hour(s))  SARS Coronavirus 2 by RT PCR (hospital order, performed in [facility] hospital lab) *cepheid single result test* Anterior Nasal Swab     Status: None   Collection Time: 09/21/21  2:39 PM   Specimen: Anterior Nasal Swab  Result Value Ref Range Status   SARS Coronavirus 2 by RT PCR NEGATIVE NEGATIVE Final    Comment: (NOTE) SARS-CoV-2 target nucleic acids are NOT DETECTED.  The SARS-CoV-2 RNA is generally detectable in upper and lower respiratory specimens during the acute phase of infection. The lowest concentration of SARS-CoV-2 viral copies this assay can detect is 250 copies / mL. Shakeem Stern negative result does not preclude SARS-CoV-2 infection and should not be used as the sole basis for treatment or other patient management decisions.  Clare Casto negative result may occur with improper specimen collection / handling, submission of specimen other than nasopharyngeal swab, presence of viral mutation(s) within the areas targeted by this assay, and inadequate number of viral copies (<250 copies / mL). Rhet Rorke negative result must be combined with clinical observations, patient history, and  epidemiological information.  Fact Sheet for Patients:   https://www.patel.info/  Fact Sheet for Healthcare Providers: https://hall.com/  This test is not yet approved or  cleared by the [place] FDA and has been authorized for detection and/or diagnosis of SARS-CoV-2 by FDA under an Emergency Use Authorization (EUA).  This EUA will remain in effect (meaning this test can be used) for the duration of the COVID-19 declaration under Section 564(b)(1) of the Act, 21 U.S.C. section 360bbb-3(b)(1), unless the authorization is terminated or revoked sooner.  Performed at [facility], [place] [address], [place], [place] [address]   Urine Culture     Status: Abnormal   Collection Time: 09/22/21  2:17 AM   Specimen: Urine, Clean Catch  Result Value Ref Range Status  Specimen Description URINE, CLEAN CATCH  Final   Special Requests   Final    NONE Performed at [facility], [place] [address] [place], [place] [address]    Culture 30,000 COLONIES/mL KLEBSIELLA PNEUMONIAE (Elma Shands)  Final   Report Status 09/24/2021 FINAL  Final   Organism ID, Bacteria KLEBSIELLA PNEUMONIAE (Bren Steers)  Final      Susceptibility   Klebsiella pneumoniae - MIC*    AMPICILLIN RESISTANT Resistant     CEFAZOLIN <=4 SENSITIVE Sensitive     CEFEPIME <=0.12 SENSITIVE Sensitive     CEFTRIAXONE <=0.25 SENSITIVE Sensitive     CIPROFLOXACIN <=0.25 SENSITIVE Sensitive     GENTAMICIN <=1 SENSITIVE Sensitive     IMIPENEM 0.5 SENSITIVE Sensitive     NITROFURANTOIN 64 INTERMEDIATE Intermediate     TRIMETH/SULFA <=20 SENSITIVE Sensitive     AMPICILLIN/SULBACTAM <=2 SENSITIVE Sensitive     PIP/TAZO <=4 SENSITIVE Sensitive     * 30,000 COLONIES/mL KLEBSIELLA PNEUMONIAE  Urine Culture     Status: Abnormal (Preliminary result)   Collection Time: 09/26/21  5:58 PM   Specimen: Urine, Clean Catch  Result Value Ref Range Status   Specimen Description URINE, CLEAN CATCH  Final    Special Requests NONE  Final   Culture (Kaidin Boehle)  Final    >=100,000 COLONIES/mL ENTEROCOCCUS FAECALIS SUSCEPTIBILITIES TO FOLLOW Performed at [facility], [place] [address], [place], [place] [address]    Report Status PENDING  Incomplete     Labs: Basic Metabolic Panel: Recent Labs  Lab 09/23/21 0103 09/23/21 1330 09/24/21 0332 09/25/21 0247 09/26/21 0132 09/27/21 0208 09/28/21 0257 09/29/21 0804  NA 143   < > 140 139 138 137 137 137  K 3.1*   < > 4.7 4.0 4.3 4.6 4.3 4.8  CL 104   < > 106 104 102 106 104 104  CO2 29   < > '27 26 26 25 25 24  '$ GLUCOSE 103*   < > 98 97 113* 110* 102* 104*  BUN 10   < > '12 12 15 '$ 28* 31* 35*  CREATININE 0.83   < > 0.99 0.90 1.06* 1.21* 1.24* 1.16*  CALCIUM 8.2*   < > 8.6* 8.8* 9.3 9.0 9.0 9.2  MG 2.2  --  1.9 2.0  --  2.0 2.1  --   PHOS  --   --   --  4.5  --  4.9* 4.9*  --    < > = values in this interval not displayed.   Liver Function Tests: Recent Labs  Lab 09/27/21 0208 09/28/21 0257  AST 16 15  ALT 17 15  ALKPHOS 63 61  BILITOT 0.9 1.1  PROT 5.8* 5.8*  ALBUMIN 3.4* 3.2*   No results for input(s): "LIPASE", "AMYLASE" in the last 168 hours. No results for input(s): "AMMONIA" in the last 168 hours. CBC: Recent Labs  Lab 09/23/21 0103 09/24/21 0332 09/25/21 0247 09/27/21 0208 09/28/21 0257  WBC 6.7 6.8 6.8 8.0 8.0  NEUTROABS  --  4.5 4.6 5.0 5.1  HGB 11.1* 11.7* 12.4 13.7 14.3  HCT 34.0* 36.5 38.2 43.3 43.4  MCV 96.3 98.1 95.3 95.2 93.7  PLT 101* 110* 124* 166 168   Cardiac Enzymes: No results for input(s): "CKTOTAL", "CKMB", "CKMBINDEX", "TROPONINI" in the last 168 hours. BNP: BNP (last 3 results) Recent Labs    09/21/21 1432  BNP 862.4*    ProBNP (last 3 results) No results for input(s): "PROBNP" in the last 8760 hours.  CBG: Recent Labs  Lab 09/28/21 0623 09/28/21 1145 09/28/21 1757 09/28/21 2358 09/29/21 0620  GLUCAP 96 141* 124* 129* 106*       Signed:  Fayrene Helper MD.  [facility] 09/29/2021, 10:30 AM

## 2021-09-28 NOTE — Progress Notes (Addendum)
Progress Note  Patient Name: [name] [name] Date of Encounter: 09/28/2021  [facility] Cardiologist: Virl Axe, MD   Subjective   Feels a little tired this a.m., feels her heart rate jumping around.  Inpatient Medications    Scheduled Meds:  apixaban  2.5 mg Oral BID   cefadroxil  500 mg Oral BID   escitalopram  10 mg Oral Daily   melatonin  5 mg Oral QHS   memantine  5 mg Oral BID   metoprolol succinate  25 mg Oral Daily   OLANZapine  2.5 mg Oral QHS   potassium chloride  10 mEq Oral Daily   pyridostigmine  60 mg Oral Daily   spironolactone  12.5 mg Oral Daily   Continuous Infusions:   PRN Meds: acetaminophen **OR** acetaminophen, hydrALAZINE   Vital Signs    Vitals:   09/26/21 2036 09/27/21 0604 09/27/21 2100 09/28/21 0452  BP: 136/72  129/72 (!) 118/54  Pulse: 70  70 70  Resp: '16  19 17  '$ Temp: 98.2 F (36.8 C)  97.6 F (36.4 C) (!) 97.3 F (36.3 C)  TempSrc: Oral  Oral Oral  SpO2: 97%  97% 100%  Weight:  58.1 kg  57.7 kg   No intake or output data in the 24 hours ending 09/28/21 0716     09/28/2021    4:52 AM 09/27/2021    6:04 AM 09/26/2021    6:00 AM  Last 3 Weights  Weight (lbs) 127 lb 3.3 oz 128 lb 1.4 oz 128 lb 8.5 oz  Weight (kg) 57.7 kg 58.1 kg 58.3 kg      Telemetry    Atrial fibs, V pacing- Personally Reviewed  ECG    No new - Personally Reviewed  Physical Exam   General: Well developed, well nourished, female in no acute distress Head: Eyes PERRLA, Head normocephalic and atraumatic Lungs: Clear bilaterally to auscultation. Heart: Irregular rate and rhythm S1 S2, without rub or gallop. No murmur. 4/4 extremity pulses are 2+ & equal. No JVD. Abdomen: Bowel sounds are present, abdomen soft and non-tender without masses or  hernias noted. Msk: Normal strength and tone for age. Extremities: No clubbing, cyanosis or edema.    Skin:  No rashes or lesions noted. Neuro: Alert and oriented X 2. Psych:  Good affect, responds  appropriately  Labs    High Sensitivity Troponin:   Recent Labs  Lab 09/21/21 1432 09/21/21 1725 09/21/21 1931 09/21/21 2324  TROPONINIHS 31* 25* 33* 40*     Chemistry Recent Labs  Lab 09/25/21 0247 09/26/21 0132 09/27/21 0208 09/28/21 0257  NA 139 138 137 137  K 4.0 4.3 4.6 4.3  CL 104 102 106 104  CO2 '26 26 25 25  '$ GLUCOSE 97 113* 110* 102*  BUN 12 15 28* 31*  CREATININE 0.90 1.06* 1.21* 1.24*  CALCIUM 8.8* 9.3 9.0 9.0  MG 2.0  --  2.0 2.1  PROT  --   --  5.8* 5.8*  ALBUMIN  --   --  3.4* 3.2*  AST  --   --  16 15  ALT  --   --  17 15  ALKPHOS  --   --  63 61  BILITOT  --   --  0.9 1.1  GFRNONAA >60 51* 43* 42*  ANIONGAP '9 10 6 8    '$ Lipids No results for input(s): "CHOL", "TRIG", "HDL", "LABVLDL", "LDLCALC", "CHOLHDL" in the last 168 hours.  Hematology Recent Labs  Lab 09/25/21 0247 09/27/21  1540 09/28/21 0257  WBC 6.8 8.0 8.0  RBC 4.01 4.55 4.63  HGB 12.4 13.7 14.3  HCT 38.2 43.3 43.4  MCV 95.3 95.2 93.7  MCH 30.9 30.1 30.9  MCHC 32.5 31.6 32.9  RDW 14.5 14.3 14.2  PLT 124* 166 168    BNP Recent Labs  Lab 09/21/21 1432  BNP 862.4*    DDimer No results for input(s): "DDIMER" in the last 168 hours.    Radiology    No results found.  Cardiac Studies   Echo from 09/02/20:   1. Left ventricular ejection fraction, by estimation, is 35 to 40%. The  left ventricle has moderately decreased function. The left ventricle  demonstrates global hypokinesis. Left ventricular diastolic parameters are  consistent with Grade II diastolic dysfunction (pseudonormalization).   2. Right ventricular systolic function is normal. The right ventricular  size is normal. There is moderately elevated pulmonary artery systolic  pressure. The estimated right ventricular systolic pressure is 08.6 mmHg.   3. Left atrial size was moderately dilated.   4. Right atrial size was moderately dilated.   5. The mitral valve is normal in structure. Mild mitral valve   regurgitation. No evidence of mitral stenosis.   6. Tricuspid valve regurgitation is moderate to severe.   7. The aortic valve is normal in structure. There is moderate  calcification of the aortic valve. There is mild thickening of the aortic  valve. Aortic valve regurgitation is mild. Mild to moderate aortic valve  sclerosis/calcification is present, without  any evidence of aortic stenosis.   8. The inferior vena cava is dilated in size with <50% respiratory  variability, suggesting right atrial pressure of 15 mmHg.   Patient Profile     88 y.o. female with PMH of CAD s/p CABG and PCI to LAD, ischemic CM with EF 35-40%, LBBB, orthostatic hypotension, complete heart block s/p CRT-P, A fib, HTN, HLD, COPD, severe dementia, depression,  cardiology is consulted for syncope and polymorphic  VT since 09/21/21.   Assessment & Plan    Syncope  Polymorphic ventricular tachycardia with Torsades  NSVT Advanced dementia planned for conservative therapy Depression and somnolence - Keep off Qtc proloning medication -K is at goal on current therapy, Creatinine increase today may be related to decrease PO intake from this weekend (she has been very somnolent) - EP recommends no amiodarone and metoprolol succinate (started on succinate 25 mg Po daily)  CAD with hx of CABG and LAD PCI  Ischemic CM with EF 35-40% - continue conservative approach  Hx of complete heart block -s/p [employer] CRT-PPM, follows EP Dr Caryl Comes    Paroxysmal A fib - s/p CRT-PPM, on eliquis 2.5 after EP discussion  HTN Orthostatic hypotension - continue ACE and MRA  COPD - per hospitalist   Has 10/20/21 follow up with EP  Seroquel and Haldol will be stopped at DC Eliquis down to 2.5 mg  Aldactone and metoprolol and new medications  Patient appears stable from a cardiac standpoint, MD advise if we will sign off today.  For questions or updates, please contact [place] Please consult [email or web address] for contact  info under        Signed, Rosaria Ferries, PA-C  09/28/2021, 7:16 AM     History and all data above reviewed.  Patient examined.  I agree with the findings as above.   No further change in therapy.    All available labs, radiology testing, previous records reviewed. Agree with documented assessment and plan. We  will sign off.  Jeneen Rinks Oniel Meleski  12:29 PM  09/28/2021

## 2021-09-28 NOTE — TOC Progression Note (Signed)
Transition of Care [facility]) - Progression Note    Patient Details  Name: Landri Dorsainvil MRN: [id number] Date of Birth: 11-16-1931  Transition of Care [facility] CM/SW [place] Phone Number: 09/28/2021, 11:23 AM  Clinical Narrative:      Patients insurance authorization has been approved. Reference # [id number]. Insurance authorization approved from 6/19-6/21. CSW informed MD. Star with [facility] confirmed she can accept patient for dc when medically ready. CSW will continue to follow and assist with patients dc planning needs.      Expected Discharge Plan and Services                                                 Social Determinants of Health (SDOH) Interventions    Readmission Risk Interventions     No data to display

## 2021-09-28 NOTE — Progress Notes (Signed)
PROGRESS NOTE    [name] [name]  TKP:[id number] DOB: July 26, 1931 DOA: 09/21/2021 PCP: System, Provider Not In  Chief Complaint  Patient presents with   Seizures    Brief Narrative:  89/F with advanced dementia from memory care unit, history of COPD, CAD with PCI, chronic systolic CHF, orthostatic hypotension who was brought to the ED earlier in the day on 6/12 after an unwitnessed fall, she was discharged and presented back to the ED same evening with possible seizure-like activity with loss of consciousness.  In the emergency room patient was noted to be in polymorphic VT, cardiology was consulted and she was started on amiodarone infusion.  Neurology was consulted and recommended correcting metabolic derangements, hydration, consider compression stockings.  Cardiology has followed and EP consulted.  PMVT thought due to QT prolonging meds and hypokalemia.  She's now been started on metoprolol and spironolactone.  Her amiodarone was d/c'd and her haldol and seroquel were discontinued as well.  The lexapro has been decreased and she's been started on low dose zyprexa.    Her hospitalization has been complicated by delirium and deconditioning.  Plan at this point is discharge to SNF prior to going back to memory care.  See below for additional details    Assessment & Plan:   Principal Problem:   Polymorphic ventricular tachycardia (HCC) Active Problems:   Dementia with behavioral disturbance (HCC)   Nausea & vomiting   Syncope   Laceration of left upper arm   Hypokalemia   Seizure-like activity (HCC)   Hypomagnesemia   AKI (acute kidney injury) (HCC)   HFrEF (heart failure with reduced ejection fraction) (HCC)   Paroxysmal atrial fibrillation (HCC)   Asymptomatic bacteriuria   Essential hypertension   CAD (coronary artery disease)   DNR (do not resuscitate)   Assessment and Plan: * Polymorphic ventricular tachycardia ([place]) Appreciate cardiology assistance Recommending  transitioning to aldactone, replace lytes Will continue to follow QTc (prolonged, V paced) EP consulted -> recommended d/c amio/seroquel, start aldcatone - they thought cause was volume overload, electrolyte abnormalities, and QT prolonging meds Mag/K at goal today Appreciate cardiology recs - start metoprolol, start aldactone. stop haldol and seroquel at d/c. lexapro and zyprexa decreased.  Follow with PCP outpatient or provider at SNF regarding lexapro taper long term as well as eventual taper/dc of zyprexa if able.  Nausea & vomiting Resolved, occurred x1 yesterday after eating  Dementia with behavioral disturbance (HCC) Reduce lexapro, reduce zyprexa discussed with pharmacy [facility], [place] for palliative outpatient More awake today, remains pleasantly confused.  Needs assistance with meals at this time.  Delirium precautions. Continue to get her out of bed if possible/safe.  Syncope In setting of torsades, above Hx orthostatic hypotension She's deconditioned after this hospitalization.  Plan for follow up at SNF for rehab.     Laceration of left upper arm 3 sutures placed to LUE, prolene Needs removal in follow up (placed 6/12) Plan for 7 days (evaluated today, don't appear ready to come out -> would reeval in 3 days)  Hypomagnesemia Goal >2  Seizure-like activity (HCC) Due to syncope above, convulsive syncope Appreciate neurology recs EEG without seizures or epileptiform discharges Tramadol d/c'd  Hypokalemia Goal >4  Paroxysmal atrial fibrillation ([place]) Initially was on hold, but after discussion with Dr. Caryl Comes, planning to resume this at reduced dose 2.5 mg BID  HFrEF (heart failure with reduced ejection fraction) ([place]) Appears euvolemic, continue home meds Echo 08/2020 with EF 35-40%  Asymptomatic bacteriuria Noted, follow Afebrile with normal  wbc count After discussion, started on duricef, repeat UA/cx pending (repeat UA not c/w UTI), will plan for  short course.  Follow final culture results.      DVT prophylaxis: eliquis Code Status: dnr Family Communication: [name] 6/14 Disposition:   Status is: Inpatient Remains inpatient appropriate because: need for additional w/u, safe d/c plan   Consultants:  Neurology cardiology  Procedures:  none  Antimicrobials:  Anti-infectives (From admission, onward)    Start     Dose/Rate Route Frequency Ordered Stop   09/26/21 2200  cefadroxil (DURICEF) capsule 500 mg        500 mg Oral 2 times daily 09/26/21 1758     09/22/21 2200  cefTRIAXone (ROCEPHIN) 1 g in sodium chloride 0.9 % 100 mL IVPB  Status:  Discontinued        1 g 200 mL/hr over 30 Minutes Intravenous Every 24 hours 09/22/21 0200 09/22/21 1101   09/22/21 0215  cefTRIAXone (ROCEPHIN) 1 g in sodium chloride 0.9 % 100 mL IVPB        1 g 200 mL/hr over 30 Minutes Intravenous  Once 09/22/21 0203 09/22/21 0305       Subjective: Pleasantly confused  Objective: Vitals:   09/27/21 2100 09/28/21 0452 09/28/21 0753 09/28/21 0956  BP: 129/72 (!) 118/54 (!) 108/59 104/63  Pulse: 70 70 70 70  Resp: 19 17    Temp: 97.6 F (36.4 C) (!) 97.3 F (36.3 C) (!) 97.5 F (36.4 C)   TempSrc: Oral Oral Oral   SpO2: 97% 100% 96%   Weight:  57.7 kg      Intake/Output Summary (Last 24 hours) at 09/28/2021 1801 Last data filed at 09/28/2021 1030 Gross per 24 hour  Intake 118 ml  Output --  Net 118 ml   Filed Weights   09/26/21 0600 09/27/21 0604 09/28/21 0452  Weight: 58.3 kg 58.1 kg 57.7 kg    Examination: General: No acute distress. Cardiovascular: RRR Lungs: unlabored Abdomen: Soft, nontender, nondistended  Neurological: pleasantly confused, moving all extremities Extremities: 3 sutures to laceration (vs skin tear) to L shoulder, oozing some serosanguinous fluid - doesn't appear ready to come out    Data Reviewed: I have personally reviewed following labs and imaging studies  CBC: Recent Labs  Lab  09/22/21 0441 09/23/21 0103 09/24/21 0332 09/25/21 0247 09/27/21 0208 09/28/21 0257  WBC 9.6 6.7 6.8 6.8 8.0 8.0  NEUTROABS 7.8*  --  4.5 4.6 5.0 5.1  HGB 11.7* 11.1* 11.7* 12.4 13.7 14.3  HCT 37.0 34.0* 36.5 38.2 43.3 43.4  MCV 97.4 96.3 98.1 95.3 95.2 93.7  PLT 112* 101* 110* 124* 166 937    Basic Metabolic Panel: Recent Labs  Lab 09/23/21 0103 09/23/21 1330 09/24/21 0332 09/25/21 0247 09/26/21 0132 09/27/21 0208 09/28/21 0257  NA 143   < > 140 139 138 137 137  K 3.1*   < > 4.7 4.0 4.3 4.6 4.3  CL 104   < > 106 104 102 106 104  CO2 29   < > '27 26 26 25 25  '$ GLUCOSE 103*   < > 98 97 113* 110* 102*  BUN 10   < > '12 12 15 '$ 28* 31*  CREATININE 0.83   < > 0.99 0.90 1.06* 1.21* 1.24*  CALCIUM 8.2*   < > 8.6* 8.8* 9.3 9.0 9.0  MG 2.2  --  1.9 2.0  --  2.0 2.1  PHOS  --   --   --  4.5  --  4.9* 4.9*   < > = values in this interval not displayed.    GFR: CrCl cannot be calculated (Unknown ideal weight.).  Liver Function Tests: Recent Labs  Lab 09/27/21 0208 09/28/21 0257  AST 16 15  ALT 17 15  ALKPHOS 63 61  BILITOT 0.9 1.1  PROT 5.8* 5.8*  ALBUMIN 3.4* 3.2*    CBG: Recent Labs  Lab 09/27/21 2005 09/28/21 0025 09/28/21 0623 09/28/21 1145 09/28/21 1757  GLUCAP 134* 141* 96 141* 124*     Recent Results (from the past 240 hour(s))  SARS Coronavirus 2 by RT PCR (hospital order, performed in [facility] hospital lab) *cepheid single result test* Anterior Nasal Swab     Status: None   Collection Time: 09/21/21  2:39 PM   Specimen: Anterior Nasal Swab  Result Value Ref Range Status   SARS Coronavirus 2 by RT PCR NEGATIVE NEGATIVE Final    Comment: (NOTE) SARS-CoV-2 target nucleic acids are NOT DETECTED.  The SARS-CoV-2 RNA is generally detectable in upper and lower respiratory specimens during the acute phase of infection. The lowest concentration of SARS-CoV-2 viral copies this assay can detect is 250 copies / mL. [name] [name] negative result does not preclude  SARS-CoV-2 infection and should not be used as the sole basis for treatment or other patient management decisions.  [name] [name] negative result may occur with improper specimen collection / handling, submission of specimen other than nasopharyngeal swab, presence of viral mutation(s) within the areas targeted by this assay, and inadequate number of viral copies (<250 copies / mL). [name] [name] negative result must be combined with clinical observations, patient history, and epidemiological information.  Fact Sheet for Patients:   https://www.patel.info/  Fact Sheet for Healthcare Providers: https://hall.com/  This test is not yet approved or  cleared by the [place] FDA and has been authorized for detection and/or diagnosis of SARS-CoV-2 by FDA under an Emergency Use Authorization (EUA).  This EUA will remain in effect (meaning this test can be used) for the duration of the COVID-19 declaration under Section 564(b)(1) of the Act, 21 U.S.C. section 360bbb-3(b)(1), unless the authorization is terminated or revoked sooner.  Performed at [facility], [place] [address] [place], [place] [address]   Urine Culture     Status: Abnormal   Collection Time: 09/22/21  2:17 AM   Specimen: Urine, Clean Catch  Result Value Ref Range Status   Specimen Description URINE, CLEAN CATCH  Final   Special Requests   Final    NONE Performed at [facility], [place] [address], [place], [place] [address]    Culture 30,000 COLONIES/mL KLEBSIELLA PNEUMONIAE ([name] [name])  Final   Report Status 09/24/2021 FINAL  Final   Organism ID, Bacteria KLEBSIELLA PNEUMONIAE ([name] [name])  Final      Susceptibility   Klebsiella pneumoniae - MIC*    AMPICILLIN RESISTANT Resistant     CEFAZOLIN <=4 SENSITIVE Sensitive     CEFEPIME <=0.12 SENSITIVE Sensitive     CEFTRIAXONE <=0.25 SENSITIVE Sensitive     CIPROFLOXACIN <=0.25 SENSITIVE Sensitive     GENTAMICIN <=1 SENSITIVE Sensitive     IMIPENEM 0.5  SENSITIVE Sensitive     NITROFURANTOIN 64 INTERMEDIATE Intermediate     TRIMETH/SULFA <=20 SENSITIVE Sensitive     AMPICILLIN/SULBACTAM <=2 SENSITIVE Sensitive     PIP/TAZO <=4 SENSITIVE Sensitive     * 30,000 COLONIES/mL KLEBSIELLA PNEUMONIAE  Urine Culture     Status: Abnormal (Preliminary result)   Collection Time: 09/26/21  5:58 PM  Specimen: Urine, Clean Catch  Result Value Ref Range Status   Specimen Description URINE, CLEAN CATCH  Final   Special Requests NONE  Final   Culture ([name] [name])  Final    >=100,000 COLONIES/mL ENTEROCOCCUS FAECALIS SUSCEPTIBILITIES TO FOLLOW Performed at [facility], [address], [place], [place] [address]    Report Status PENDING  Incomplete         Radiology Studies: No results found.      Scheduled Meds:  apixaban  2.5 mg Oral BID   cefadroxil  500 mg Oral BID   escitalopram  10 mg Oral Daily   melatonin  5 mg Oral QHS   memantine  5 mg Oral BID   metoprolol succinate  25 mg Oral Daily   OLANZapine  2.5 mg Oral QHS   potassium chloride  10 mEq Oral Daily   pyridostigmine  60 mg Oral Daily   spironolactone  12.5 mg Oral Daily   Continuous Infusions:   LOS: 7 days    Time spent: over 30 min    [name] Helper, MD [facility]   To contact the attending provider between 7A-7P or the covering provider during after hours 7P-7A, please log into the web site [email or web address] and access using universal [place] password for that web site. If you do not have the password, please call the hospital operator.  09/28/2021, 6:01 PM

## 2021-09-28 NOTE — Progress Notes (Signed)
Mobility Specialist Progress Note    09/28/21 1307  Mobility  Activity Transferred from bed to chair  Level of Assistance +2 (takes two people) (safety)  Assistive Device Other (Comment) (HHA)  Distance Ambulated (ft) 3 ft  Activity Response Tolerated fair  $Mobility charge 1 Mobility   Pt received in bed and wet. Agreeable to get to chair. ModA +1 but used +2 d/t trunk flexion and pt stating she couldn't do it upon standing. Left in chair with NT present.   Hildred Alamin Mobility Specialist

## 2021-09-28 NOTE — Progress Notes (Signed)
Unable to complete admission questions-pt has advanced dementia/oriented to self only, no family present at bedside. Jessie Foot, RN

## 2021-09-29 LAB — BASIC METABOLIC PANEL
Anion gap: 9 (ref 5–15)
BUN: 35 mg/dL — ABNORMAL HIGH (ref 8–23)
CO2: 24 mmol/L (ref 22–32)
Calcium: 9.2 mg/dL (ref 8.9–10.3)
Chloride: 104 mmol/L (ref 98–111)
Creatinine, Ser: 1.16 mg/dL — ABNORMAL HIGH (ref 0.44–1.00)
GFR, Estimated: 46 mL/min — ABNORMAL LOW (ref >60)
Glucose, Bld: 104 mg/dL — ABNORMAL HIGH (ref 70–99)
Potassium: 4.8 mmol/L (ref 3.5–5.1)
Sodium: 137 mmol/L (ref 135–145)

## 2021-09-29 LAB — GLUCOSE, CAPILLARY
Glucose-Capillary: 106 mg/dL — ABNORMAL HIGH (ref 70–99)
Glucose-Capillary: 129 mg/dL — ABNORMAL HIGH (ref 70–99)
Glucose-Capillary: 133 mg/dL — ABNORMAL HIGH (ref 70–99)

## 2021-09-29 LAB — URINE CULTURE: Culture: >=100000 — AB

## 2021-09-29 MED ORDER — APIXABAN 2.5 MG PO TABS: 2.5000 mg | ORAL_TABLET | Freq: Two times a day (BID) | ORAL | 0 refills | Status: AC

## 2021-09-29 MED ORDER — ESCITALOPRAM OXALATE 10 MG PO TABS: 10.0000 mg | ORAL_TABLET | Freq: Every day | ORAL | 0 refills | Status: AC

## 2021-09-29 MED ORDER — OLANZAPINE 2.5 MG PO TABS: 2.5000 mg | ORAL_TABLET | Freq: Every day | ORAL | 0 refills | Status: AC

## 2021-09-29 MED ORDER — SPIRONOLACTONE 25 MG PO TABS: 12.5000 mg | ORAL_TABLET | Freq: Every day | ORAL | 0 refills | Status: AC

## 2021-09-29 MED ORDER — METOPROLOL SUCCINATE ER 25 MG PO TB24: 25.0000 mg | ORAL_TABLET | Freq: Every day | ORAL | 0 refills | Status: AC

## 2021-09-29 MED ORDER — FOSFOMYCIN TROMETHAMINE 3 G PO PACK
3.0000 g | PACK | Freq: Once | ORAL | Status: AC
Start: 2021-09-29 — End: 2021-09-29
  Administered 2021-09-29: 3 g via ORAL
  Filled 2021-09-29: qty 3

## 2021-09-29 NOTE — Progress Notes (Signed)
Pt picked up by [facility].  

## 2021-09-29 NOTE — Care Management Important Message (Signed)
Important Message  Patient Details  Name: Lucindy Borel MRN: [id number] Date of Birth: Feb 04, 1932   Medicare Important Message Given:  Yes     Shelda Altes 09/29/2021, 9:23 AM

## 2021-09-29 NOTE — Assessment & Plan Note (Signed)
Mild, continue to monitor, improving Repeat labs in 2-3 days

## 2021-09-29 NOTE — TOC Transition Note (Signed)
Transition of Care [facility]) - CM/SW Discharge Note   Patient Details  Name: [name] [name] MRN: [id number] Date of Birth: [birth year]  Transition of [facility]) CM/SW Contact:  Milas Gain, [place] Phone Number: 09/29/2021, 11:29 AM   Clinical Narrative:     Patient will DC to: [place]   Anticipated DC date: 09/29/2021  Family notified: [name]   Transport by: Corey Harold  ?  Per MD patient ready for DC to [facility] with palliative services to follow . RN, patient, patient's family, Fabio Pierce with [facility] facility notified of DC. Discharge Summary sent to facility. RN given number for report tele# [phone]. DC packet on chart. DNR signed by MD attached to patients DC packet.Ambulance transport requested for patient.  CSW signing off.   Final next level of care: Skilled Nursing Facility Barriers to Discharge: No Barriers Identified   Patient Goals and CMS Choice   CMS Medicare.gov Compare Post Acute Care list provided to:: Patient Represenative (must comment) (Patients daughter [name]) Choice offered to / list presented to : Adult Children (Patients daughter [name])  Discharge Placement              Patient chooses bed at: [facility] Patient to be transferred to facility by: [place] Name of family member notified: Sarah Patient and family notified of of transfer: 09/29/21  Discharge Plan and Services                                     Social Determinants of Health (SDOH) Interventions     Readmission Risk Interventions     No data to display

## 2021-09-29 NOTE — Progress Notes (Signed)
Report given to [facility].

## 2021-09-29 NOTE — Progress Notes (Signed)
[place] to give report. Receiving RN busy at this time. Will try to call back in 10 minutes.

## 2021-10-01 ENCOUNTER — Telehealth: Payer: Self-pay | Admitting: Internal Medicine

## 2021-10-01 NOTE — Telephone Encounter (Signed)
Spoke with the patient's daughter who states that the patient just got to [facility] and was wondering if labs could be drawn there. Advised that would be fine if they are able to do it we can fax over orders. She is going to call and check and let us know. Patient is needing to have a BMET drawn per Oda Kilts, PA.

## 2021-10-01 NOTE — Telephone Encounter (Signed)
Daughter wondering if the labs patient is suppose to have this coming Friday can be done at [facility] where patient is currently at.  If so can the lab orders be faxed to them. Please advise.

## 2021-10-01 NOTE — Telephone Encounter (Signed)
  Pt's daughter calling back, she would like to update Carly that [facility] drew the blood today and cancelled the lab appt for tomorrow

## 2021-10-02 ENCOUNTER — Other Ambulatory Visit: Payer: Medicare Other

## 2021-10-14 ENCOUNTER — Encounter: Payer: Medicare Other | Admitting: Internal Medicine

## 2021-10-18 NOTE — Progress Notes (Deleted)
Cardiology Office Note Date:  10/18/2021  Patient ID:  [name] [name], [name] [name] [birth year], MRN [id number] PCP:  System, Provider Not In  Electrophysiologist: Dr. Caryl Comes  ***refresh   Chief Complaint: *** 3 week visit  History of Present Illness: [name] [name] is a 88 y.o. female with history of CAD (prior PCI to LAD > CABG), ICM, CRT-P, AFib, syncope (described as partially 2/2 orthostasis), severe dementia (panned conservative tx)  She comes in today to be seen for Dr. Caryl Comes, last seen by him March 2020, she had recently had cryoablation for a kidney ablation and had some bleeding issues.  Developed insomia w/mestinon, better with reduced dose, though ongoing orthostatic trouble. Did not tolerate abd binder, had itching with midodrine. Urged to retry/compliance with thigh sleeves, could consider northera.  Hospitalized 09/21/21- 09/29/21 2/2 syncope w/seizure-like activity, once while in the ER on telemetry associated with PMVT and torsades, she was started on amiodarone,admitted and cardiology > Ep consulted Initial K+ 3.1 and replaced Dr. Caryl Comes observed this was proceeded QT lengthening and recommended stopping amiodarone, and all QT prolonging agents, keeping lytes replaced. Trops flat Started on Toprol  NO REMOTES   *** Sara Gorce's mom *** volume *** care at [facility]? *** labs, lytes *** recurrent syncope? Falls, orthostatic? *** AF birden *** eliquis, bleeding, dose 2.5   Device information StJude CRT-P implanted 02/02/2013   Past Medical History:  Diagnosis Date   Atrial fibrillation ([place]) 10/06/2007   post op   COPD (chronic obstructive pulmonary disease) ([place])    Emphysema on 02/03/13 CXR   CORONARY ARTERY DISEASE 10/06/2007   a. s/p PCI to LAD; b. s/p CABG; c. LHC 07/13/12: Proximal LAD stent patent, LIMA-LAD atretic, D1 occluded, proximal circumflex 30%, mid RCA occluded, SVG-D1 normal, SVG-OM2 normal, SVG-distal RCA normal, EF 40% with diffuse HK   Dizziness     History of colonic polyps 10/30/2009   No polyps in 2011. No repeat.     HYPERLIPIDEMIA 10/06/2007   HYPERTENSION 10/06/2007   LBBB 09/06/2008   Left renal mass    MELANOMA 09/06/2008   MOES PROCEDURE RIGHT   NICM (nonischemic cardiomyopathy) ([place])    Echocardiogram 07/12/12: EF 25-30%, diffuse HK, mild AI, mild MR, mild LAE   PERSONAL HX COLONIC POLYPS 10/30/2009   Presence of permanent cardiac pacemaker    Syncope    UTERINE CANCER, HX OF 09/06/2008   VARICOSE VEIN 09/29/2009    Past Surgical History:  Procedure Laterality Date   ABDOMINAL HYSTERECTOMY  1988   BI-VENTRICULAR PACEMAKER INSERTION (CRT-P)  02/02/2013   St. Jude, serial no. #[id number]    CARDIAC CATHETERIZATION  08/30/2008   CORONARY ANGIOPLASTY WITH STENT PLACEMENT  2009   CORONARY ARTERY BYPASS GRAFT  2004   IR RADIOLOGIST EVAL & MGMT  02/08/2017   IR RADIOLOGIST EVAL & MGMT  05/26/2017   IR RADIOLOGIST EVAL & MGMT  07/19/2017   IR RADIOLOGIST EVAL & MGMT  05/04/2018   LEFT HEART CATHETERIZATION WITH CORONARY ANGIOGRAM Bilateral 07/13/2012   Procedure: LEFT HEART CATHETERIZATION WITH CORONARY ANGIOGRAM;  Surgeon: Peter M Martinique, MD;  Location[facility] CATH LAB;  Service: Cardiovascular;  Laterality: Bilateral;   OOPHORECTOMY     PERMANENT PACEMAKER INSERTION N/A 02/02/2013   Procedure: PERMANENT PACEMAKER INSERTION;  Surgeon: Evans Lance, MD;  Location: [facility] CATH LAB;  Service: Cardiovascular;  Laterality: N/A;   RADIOLOGY WITH ANESTHESIA Left 03/28/2017   Procedure: RENAL CRYO ABLATION;  Surgeon: Aletta Edouard, MD;  Location: [facility] ORS;  Service: Radiology;  Laterality: Left;   right distal pretibeal     melanoma   TOTAL HIP ARTHROPLASTY Right 09/04/2020   Procedure: ANTERIOR TOTAL HIP ARTHROPLASTY;  Surgeon: Rod Can, MD;  Location: [facility] ORS;  Service: Orthopedics;  Laterality: Right;    Current Outpatient Medications  Medication Sig Dispense Refill   acetaminophen (TYLENOL) 500 MG tablet Take 1,000 mg by mouth in  the morning and at bedtime.     apixaban (ELIQUIS) 2.5 MG TABS tablet Take 1 tablet (2.5 mg total) by mouth 2 (two) times daily. 60 tablet 0   Cholecalciferol (VITAMIN D3 SUPER STRENGTH) 50 MCG (2000 UT) TABS Take 2,000 Units by mouth daily.     escitalopram (LEXAPRO) 10 MG tablet Take 1 tablet (10 mg total) by mouth daily. 30 tablet 0   Melatonin 10 MG TABS Take 10 mg by mouth at bedtime.     memantine (NAMENDA) 5 MG tablet Take 5 mg by mouth in the morning and at bedtime.     metoprolol succinate (TOPROL-XL) 25 MG 24 hr tablet Take 1 tablet (25 mg total) by mouth daily. 30 tablet 0   OLANZapine (ZYPREXA) 2.5 MG tablet Take 1 tablet (2.5 mg total) by mouth at bedtime. 30 tablet 0   pyridostigmine (MESTINON) 60 MG tablet TAKE 1 TABLET(60 MG) BY MOUTH DAILY (Patient taking differently: Take 60 mg by mouth in the morning.) 90 tablet 2   spironolactone (ALDACTONE) 25 MG tablet Take 0.5 tablets (12.5 mg total) by mouth daily. 15 tablet 0   No current facility-administered medications for this visit.    Allergies:   Aricept [donepezil] and Nitroglycerin   Social History:  The patient  reports that she has never smoked. She has never used smokeless tobacco. She reports current alcohol use. She reports that she does not use drugs.   Family History:  The patient's family history includes Heart attack in her father; Stroke in her mother.  ROS:  Please see the history of present illness.    All other systems are reviewed and otherwise negative.   PHYSICAL EXAM:  VS:  LMP  (LMP Unknown)  BMI: There is no height or weight on file to calculate BMI. Well nourished, well developed, in no acute distress HEENT: normocephalic, atraumatic Neck: no JVD, carotid bruits or masses Cardiac:  *** RRR; no significant murmurs, no rubs, or gallops Lungs:  *** CTA b/l, no wheezing, rhonchi or rales Abd: soft, nontender MS: no deformity or *** atrophy Ext: *** no edema Skin: warm and dry, no rash Neuro:  No  gross deficits appreciated Psych: euthymic mood, full affect  *** PPM site is stable, no tethering or discomfort   EKG:  not done today  Device interrogation done today and reviewed by myself:  ***  09/02/2020: TTE 1. Left ventricular ejection fraction, by estimation, is 35 to 40%. The  left ventricle has moderately decreased function. The left ventricle  demonstrates global hypokinesis. Left ventricular diastolic parameters are  consistent with Grade II diastolic  dysfunction (pseudonormalization).   2. Right ventricular systolic function is normal. The right ventricular  size is normal. There is moderately elevated pulmonary artery systolic  pressure. The estimated right ventricular systolic pressure is 55.7 mmHg.   3. Left atrial size was moderately dilated.   4. Right atrial size was moderately dilated.   5. The mitral valve is normal in structure. Mild mitral valve  regurgitation. No evidence of mitral stenosis.   6. Tricuspid valve regurgitation is moderate to severe.  7. The aortic valve is normal in structure. There is moderate  calcification of the aortic valve. There is mild thickening of the aortic  valve. Aortic valve regurgitation is mild. Mild to moderate aortic valve  sclerosis/calcification is present, without  any evidence of aortic stenosis.   8. The inferior vena cava is dilated in size with <50% respiratory  variability, suggesting right atrial pressure of 15 mmHg.   Recent Labs: 09/21/2021: B Natriuretic Peptide 862.4 09/28/2021: ALT 15; Hemoglobin 14.3; Magnesium 2.1; Platelets 168 09/29/2021: BUN 35; Creatinine, Ser 1.16; Potassium 4.8; Sodium 137  No results found for requested labs within last 365 days.   CrCl cannot be calculated (Unknown ideal weight.).   Wt Readings from Last 3 Encounters:  09/28/21 127 lb 3.3 oz (57.7 kg)  09/04/20 129 lb 3 oz (58.6 kg)  08/17/19 144 lb (65.3 kg)     Other studies reviewed: Additional studies/records reviewed  today include: summarized above  ASSESSMENT AND PLAN:  CRT-P ***  CAD ***   ICM Chronic CHF ***  PMVT/Torsades *** ? Dynammic QT prolongation QT prolonging meds were stopped *** lytes/labs today  6. Paroxysmal Afib CHA2DS2Vasc is 5, on *** Eliquis, appropriately dosed *** burden  Disposition: F/u with ***  Current medicines are reviewed at length with the patient today.  The patient did not have any concerns regarding medicines.  Venetia Night, PA-C 10/18/2021 3:03 PM     [place]  [phone] (office)  [phone] (fax)

## 2021-10-18 NOTE — Progress Notes (Signed)
This encounter was created in error - please disregard.

## 2021-10-20 ENCOUNTER — Ambulatory Visit: Payer: Medicare Other | Admitting: Physician Assistant

## 2021-10-20 NOTE — Progress Notes (Unsigned)
Cardiology Office Note Date:  10/21/2021  Patient ID:  [name] [name], DOB Sep 07, 1931, MRN [id number] PCP:  System, Provider Not In  Electrophysiologist: Dr. Caryl Comes    Chief Complaint:  3 week visit  History of Present Illness: [name] [name] is a 88 y.o. female with history of CAD (prior PCI to LAD > CABG), ICM, CRT-P, AFib, syncope (described as partially 2/2 orthostasis), severe dementia (panned conservative tx)  She comes in today to be seen for Dr. Caryl Comes, last seen by him March 2020, she had recently had cryoablation for a kidney ablation and had some bleeding issues.  Developed insomia w/mestinon, better with reduced dose, though ongoing orthostatic trouble. Did not tolerate abd binder, had itching with midodrine. Urged to retry/compliance with thigh sleeves, could consider northera.  Hospitalized 09/21/21- 09/29/21 2/2 syncope w/seizure-like activity, once while in the ER on telemetry associated with PMVT and torsades, she was started on amiodarone,admitted and cardiology > Ep consulted Initial K+ 3.1 and replaced Dr. Caryl Comes observed this was proceeded QT lengthening and recommended stopping amiodarone, and all QT prolonging agents, keeping lytes replaced. Trops flat Started on Toprol  NO REMOTES   Today She comes today accompanied by her son-in-law [name] [name]. She has advanced dementia, able to tell me her name only. She is quite cooperative, and inquisitive. [name] [name] reports that she is back at [facility]. No reported recurrent falls, faints, episodes of any kind Her daughter [name] [name], when visiting as perhaps noticed some degree of SOB and edema, with concerns of perhaps volume OL, inquired about a CXR No noted or reported bleeding or signs of bleeding    Device information StJude CRT-P implanted 02/02/2013   Past Medical History:  Diagnosis Date   Atrial fibrillation ([place]) 10/06/2007   post op   COPD (chronic obstructive pulmonary disease) ([place])    Emphysema on  02/03/13 CXR   CORONARY ARTERY DISEASE 10/06/2007   a. s/p PCI to LAD; b. s/p CABG; c. LHC 07/13/12: Proximal LAD stent patent, LIMA-LAD atretic, D1 occluded, proximal circumflex 30%, mid RCA occluded, SVG-D1 normal, SVG-OM2 normal, SVG-distal RCA normal, EF 40% with diffuse HK   Dizziness    History of colonic polyps 10/30/2009   No polyps in 2011. No repeat.     HYPERLIPIDEMIA 10/06/2007   HYPERTENSION 10/06/2007   LBBB 09/06/2008   Left renal mass    MELANOMA 09/06/2008   MOES PROCEDURE RIGHT   NICM (nonischemic cardiomyopathy) ([place])    Echocardiogram 07/12/12: EF 25-30%, diffuse HK, mild AI, mild MR, mild LAE   PERSONAL HX COLONIC POLYPS 10/30/2009   Presence of permanent cardiac pacemaker    Syncope    UTERINE CANCER, HX OF 09/06/2008   VARICOSE VEIN 09/29/2009    Past Surgical History:  Procedure Laterality Date   ABDOMINAL HYSTERECTOMY  1988   BI-VENTRICULAR PACEMAKER INSERTION (CRT-P)  02/02/2013   St. Jude, serial no. #[id number]    CARDIAC CATHETERIZATION  08/30/2008   CORONARY ANGIOPLASTY WITH STENT PLACEMENT  2009   CORONARY ARTERY BYPASS GRAFT  2004   IR RADIOLOGIST EVAL & MGMT  02/08/2017   IR RADIOLOGIST EVAL & MGMT  05/26/2017   IR RADIOLOGIST EVAL & MGMT  07/19/2017   IR RADIOLOGIST EVAL & MGMT  05/04/2018   LEFT HEART CATHETERIZATION WITH CORONARY ANGIOGRAM Bilateral 07/13/2012   Procedure: LEFT HEART CATHETERIZATION WITH CORONARY ANGIOGRAM;  Surgeon: Peter M Martinique, MD;  Location: [facility] CATH LAB;  Service: Cardiovascular;  Laterality: Bilateral;   OOPHORECTOMY     PERMANENT PACEMAKER  INSERTION N/A 02/02/2013   Procedure: PERMANENT PACEMAKER INSERTION;  Surgeon: Evans Lance, MD;  Location: [facility] CATH LAB;  Service: Cardiovascular;  Laterality: N/A;   RADIOLOGY WITH ANESTHESIA Left 03/28/2017   Procedure: RENAL CRYO ABLATION;  Surgeon: Aletta Edouard, MD;  Location: [facility] ORS;  Service: Radiology;  Laterality: Left;   right distal pretibeal     melanoma   TOTAL HIP  ARTHROPLASTY Right 09/04/2020   Procedure: ANTERIOR TOTAL HIP ARTHROPLASTY;  Surgeon: Rod Can, MD;  Location: [facility] ORS;  Service: Orthopedics;  Laterality: Right;    Current Outpatient Medications  Medication Sig Dispense Refill   acetaminophen (TYLENOL) 500 MG tablet Take 1,000 mg by mouth in the morning and at bedtime.     apixaban (ELIQUIS) 2.5 MG TABS tablet Take 1 tablet (2.5 mg total) by mouth 2 (two) times daily. 60 tablet 0   Cholecalciferol (VITAMIN D3 SUPER STRENGTH) 50 MCG (2000 UT) TABS Take 2,000 Units by mouth daily.     escitalopram (LEXAPRO) 10 MG tablet Take 1 tablet (10 mg total) by mouth daily. 30 tablet 0   MAGNESIUM OXIDE PO Take 200 mg by mouth every other day.     Melatonin 10 MG TABS Take 10 mg by mouth at bedtime.     memantine (NAMENDA) 5 MG tablet Take 5 mg by mouth in the morning and at bedtime.     metoprolol succinate (TOPROL-XL) 25 MG 24 hr tablet Take 1 tablet (25 mg total) by mouth daily. 30 tablet 0   OLANZapine (ZYPREXA) 2.5 MG tablet Take 1 tablet (2.5 mg total) by mouth at bedtime. 30 tablet 0   pyridostigmine (MESTINON) 60 MG tablet TAKE 1 TABLET(60 MG) BY MOUTH DAILY (Patient taking differently: Take 60 mg by mouth in the morning.) 90 tablet 2   spironolactone (ALDACTONE) 25 MG tablet Take 0.5 tablets (12.5 mg total) by mouth daily. 15 tablet 0   No current facility-administered medications for this visit.    Allergies:   Aricept [donepezil] and Nitroglycerin   Social History:  The patient  reports that she has never smoked. She has never used smokeless tobacco. She reports current alcohol use. She reports that she does not use drugs.   Family History:  The patient's family history includes Heart attack in her father; Stroke in her mother.  ROS:  Please see the history of present illness.    All other systems are reviewed and otherwise negative.   PHYSICAL EXAM:  VS:  BP 112/64   Pulse 69   Ht '5\' 6"'$  (1.676 m)   Wt 134 lb (60.8 kg)   LMP   (LMP Unknown)   SpO2 95%   BMI 21.63 kg/m  BMI: Body mass index is 21.63 kg/m. Well nourished, well developed, in no acute distress, appears her age 33: normocephalic, atraumatic Neck: no JVD, carotid bruits or masses Cardiac:   RRR; no significant murmurs, no rubs, or gallops Lungs:  CTA b/l, no wheezing, rhonchi or rales Abd: soft, nontender MS: no deformity, age appropriate atrophy Ext: trace-1+ edema to just above the ankles b/l Skin: warm and dry, no rash Neuro:  No gross deficits appreciated Psych: euthymic mood, full affect  PPM site is stable, no tethering or discomfort   EKG:  done today and reviewed by myself Afib, V paced at 70  Device interrogation done today and reviewed by myself:  Battery reached ERI 03/15/21 Reached EOS 08/04/21 NO LEAD TESTING, FURTHER DEVICE INTERROGATION WAS PERFORMED Presenting EGM is AFib BP at  84m (70bpm)  09/02/2020: TTE 1. Left ventricular ejection fraction, by estimation, is 35 to 40%. The  left ventricle has moderately decreased function. The left ventricle  demonstrates global hypokinesis. Left ventricular diastolic parameters are  consistent with Grade II diastolic  dysfunction (pseudonormalization).   2. Right ventricular systolic function is normal. The right ventricular  size is normal. There is moderately elevated pulmonary artery systolic  pressure. The estimated right ventricular systolic pressure is 557.8mmHg.   3. Left atrial size was moderately dilated.   4. Right atrial size was moderately dilated.   5. The mitral valve is normal in structure. Mild mitral valve  regurgitation. No evidence of mitral stenosis.   6. Tricuspid valve regurgitation is moderate to severe.   7. The aortic valve is normal in structure. There is moderate  calcification of the aortic valve. There is mild thickening of the aortic  valve. Aortic valve regurgitation is mild. Mild to moderate aortic valve  sclerosis/calcification is present,  without  any evidence of aortic stenosis.   8. The inferior vena cava is dilated in size with <50% respiratory  variability, suggesting right atrial pressure of 15 mmHg.   Recent Labs: 09/21/2021: B Natriuretic Peptide 862.4 09/28/2021: ALT 15; Hemoglobin 14.3; Magnesium 2.1; Platelets 168 09/29/2021: BUN 35; Creatinine, Ser 1.16; Potassium 4.8; Sodium 137  No results found for requested labs within last 365 days.   CrCl cannot be calculated (Patient's most recent lab result is older than the maximum 21 days allowed.).   Wt Readings from Last 3 Encounters:  10/21/21 134 lb (60.8 kg)  09/28/21 127 lb 3.3 oz (57.7 kg)  09/04/20 129 lb 3 oz (58.6 kg)     Other studies reviewed: Additional studies/records reviewed today include: summarized above  ASSESSMENT AND PLAN:  CRT-P Device battery reached EOS status as discuss above I discussed with Dr. CCurt Bears(DOD) and [employer] rep. [employer] reports that given she is still at appropriate AMS rate that battery life is not likely have imminent failure, would not delay generator any longer then possible Dr. CCurt Bearsrecommends getting plans for next week done.  This was discussed with JJeneen Rinkshere today, including the procedure, potential risks and benefits, he planned to have Sarah reach out to discuss further  I was able to talk with SJudson Rochafter the patient left, discussed pacing indication/known hx of CHB and unknown if she is/is not dependent and battery status. Discussed the generator change procedure, potential risks/benefits, and recommend that we pursue the procedure sooner rather then later, by next week. She will talk with her siblings and get back to me.  CAD No clear symptoms No ischemic EKG changes   ICM Chronic CHF She has some edema, though looks very comfortable, lungs are clear No changes today Will get labs, lytes  PMVT/Torsades ? Dynammic QT prolongation I have request that consideration for her lexapro and or Zyprexa try to  be down-titrated or eventually syopped if able QTc manually by myself is accounting for QRS duration [id number]/labs today  6. Paroxysmal Afib CHA2DS2Vasc is 5, on Eliquis, appropriately dosed unknown burden, she is in AF today    Disposition: will await SaJudson Rochnd get scheduling of her procedure done.  Current medicines are reviewed at length with the patient today.  The patient did not have any concerns regarding medicines.  SiVenetia NightPA-C 10/21/2021 1:16 PM     [facility] [address] [place] [phone])  [phone]  fax)

## 2021-10-20 NOTE — H&P (View-Only) (Signed)
Cardiology Office Note Date:  10/21/2021  Patient ID:  [name] [name], DOB 1931/12/24, MRN [id number] PCP:  System, Provider Not In  Electrophysiologist: Dr. Caryl Comes    Chief Complaint:  3 week visit  History of Present Illness: [name] [name] is a 88 y.o. female with history of CAD (prior PCI to LAD > CABG), ICM, CRT-P, AFib, syncope (described as partially 2/2 orthostasis), severe dementia (panned conservative tx)  She comes in today to be seen for Dr. Caryl Comes, last seen by him March 2020, she had recently had cryoablation for a kidney ablation and had some bleeding issues.  Developed insomia w/mestinon, better with reduced dose, though ongoing orthostatic trouble. Did not tolerate abd binder, had itching with midodrine. Urged to retry/compliance with thigh sleeves, could consider northera.  Hospitalized 09/21/21- 09/29/21 2/2 syncope w/seizure-like activity, once while in the ER on telemetry associated with PMVT and torsades, she was started on amiodarone,admitted and cardiology > Ep consulted Initial K+ 3.1 and replaced Dr. Caryl Comes observed this was proceeded QT lengthening and recommended stopping amiodarone, and all QT prolonging agents, keeping lytes replaced. Trops flat Started on Toprol  NO REMOTES   Today She comes today accompanied by her son-in-law Jeneen Rinks. She has advanced dementia, able to tell me her name only. She is quite cooperative, and inquisitive. Jeneen Rinks reports that she is back at [facility]. No reported recurrent falls, faints, episodes of any kind Her daughter [name], when visiting as perhaps noticed some degree of SOB and edema, with concerns of perhaps volume OL, inquired about a CXR No noted or reported bleeding or signs of bleeding    Device information StJude CRT-P implanted 02/02/2013   Past Medical History:  Diagnosis Date   Atrial fibrillation ([place]) 10/06/2007   post op   COPD (chronic obstructive pulmonary disease) ([place])    Emphysema on  02/03/13 CXR   CORONARY ARTERY DISEASE 10/06/2007   a. s/p PCI to LAD; b. s/p CABG; c. LHC 07/13/12: Proximal LAD stent patent, LIMA-LAD atretic, D1 occluded, proximal circumflex 30%, mid RCA occluded, SVG-D1 normal, SVG-OM2 normal, SVG-distal RCA normal, EF 40% with diffuse HK   Dizziness    History of colonic polyps 10/30/2009   No polyps in 2011. No repeat.     HYPERLIPIDEMIA 10/06/2007   HYPERTENSION 10/06/2007   LBBB 09/06/2008   Left renal mass    MELANOMA 09/06/2008   MOES PROCEDURE RIGHT   NICM (nonischemic cardiomyopathy) ([place])    Echocardiogram 07/12/12: EF 25-30%, diffuse HK, mild AI, mild MR, mild LAE   PERSONAL HX COLONIC POLYPS 10/30/2009   Presence of permanent cardiac pacemaker    Syncope    UTERINE CANCER, HX OF 09/06/2008   VARICOSE VEIN 09/29/2009    Past Surgical History:  Procedure Laterality Date   ABDOMINAL HYSTERECTOMY  1988   BI-VENTRICULAR PACEMAKER INSERTION (CRT-P)  02/02/2013   St. Jude, serial no. #[id number]    CARDIAC CATHETERIZATION  08/30/2008   CORONARY ANGIOPLASTY WITH STENT PLACEMENT  2009   CORONARY ARTERY BYPASS GRAFT  2004   IR RADIOLOGIST EVAL & MGMT  02/08/2017   IR RADIOLOGIST EVAL & MGMT  05/26/2017   IR RADIOLOGIST EVAL & MGMT  07/19/2017   IR RADIOLOGIST EVAL & MGMT  05/04/2018   LEFT HEART CATHETERIZATION WITH CORONARY ANGIOGRAM Bilateral 07/13/2012   Procedure: LEFT HEART CATHETERIZATION WITH CORONARY ANGIOGRAM;  Surgeon: Peter M Martinique, MD;  Location: [facility] CATH LAB;  Service: Cardiovascular;  Laterality: Bilateral;   OOPHORECTOMY     PERMANENT PACEMAKER  INSERTION N/A 02/02/2013   Procedure: PERMANENT PACEMAKER INSERTION;  Surgeon: Evans Lance, MD;  Location: [facility] CATH LAB;  Service: Cardiovascular;  Laterality: N/A;   RADIOLOGY WITH ANESTHESIA Left 03/28/2017   Procedure: RENAL CRYO ABLATION;  Surgeon: Aletta Edouard, MD;  Location: [facility] ORS;  Service: Radiology;  Laterality: Left;   right distal pretibeal     melanoma   TOTAL HIP  ARTHROPLASTY Right 09/04/2020   Procedure: ANTERIOR TOTAL HIP ARTHROPLASTY;  Surgeon: Rod Can, MD;  Location: [facility] ORS;  Service: Orthopedics;  Laterality: Right;    Current Outpatient Medications  Medication Sig Dispense Refill   acetaminophen (TYLENOL) 500 MG tablet Take 1,000 mg by mouth in the morning and at bedtime.     apixaban (ELIQUIS) 2.5 MG TABS tablet Take 1 tablet (2.5 mg total) by mouth 2 (two) times daily. 60 tablet 0   Cholecalciferol (VITAMIN D3 SUPER STRENGTH) 50 MCG (2000 UT) TABS Take 2,000 Units by mouth daily.     escitalopram (LEXAPRO) 10 MG tablet Take 1 tablet (10 mg total) by mouth daily. 30 tablet 0   MAGNESIUM OXIDE PO Take 200 mg by mouth every other day.     Melatonin 10 MG TABS Take 10 mg by mouth at bedtime.     memantine (NAMENDA) 5 MG tablet Take 5 mg by mouth in the morning and at bedtime.     metoprolol succinate (TOPROL-XL) 25 MG 24 hr tablet Take 1 tablet (25 mg total) by mouth daily. 30 tablet 0   OLANZapine (ZYPREXA) 2.5 MG tablet Take 1 tablet (2.5 mg total) by mouth at bedtime. 30 tablet 0   pyridostigmine (MESTINON) 60 MG tablet TAKE 1 TABLET(60 MG) BY MOUTH DAILY (Patient taking differently: Take 60 mg by mouth in the morning.) 90 tablet 2   spironolactone (ALDACTONE) 25 MG tablet Take 0.5 tablets (12.5 mg total) by mouth daily. 15 tablet 0   No current facility-administered medications for this visit.    Allergies:   Aricept [donepezil] and Nitroglycerin   Social History:  The patient  reports that she has never smoked. She has never used smokeless tobacco. She reports current alcohol use. She reports that she does not use drugs.   Family History:  The patient's family history includes Heart attack in her father; Stroke in her mother.  ROS:  Please see the history of present illness.    All other systems are reviewed and otherwise negative.   PHYSICAL EXAM:  VS:  BP 112/64   Pulse 69   Ht '5\' 6"'$  (1.676 m)   Wt 134 lb (60.8 kg)   LMP   (LMP Unknown)   SpO2 95%   BMI 21.63 kg/m  BMI: Body mass index is 21.63 kg/m. Well nourished, well developed, in no acute distress, appears her age 77: normocephalic, atraumatic Neck: no JVD, carotid bruits or masses Cardiac:   RRR; no significant murmurs, no rubs, or gallops Lungs:  CTA b/l, no wheezing, rhonchi or rales Abd: soft, nontender MS: no deformity, age appropriate atrophy Ext: trace-1+ edema to just above the ankles b/l Skin: warm and dry, no rash Neuro:  No gross deficits appreciated Psych: euthymic mood, full affect  PPM site is stable, no tethering or discomfort   EKG:  done today and reviewed by myself Afib, V paced at 70  Device interrogation done today and reviewed by myself:  Battery reached ERI 03/15/21 Reached EOS 08/04/21 NO LEAD TESTING, FURTHER DEVICE INTERROGATION WAS PERFORMED Presenting EGM is AFib BP at  84m (70bpm)  09/02/2020: TTE 1. Left ventricular ejection fraction, by estimation, is 35 to 40%. The  left ventricle has moderately decreased function. The left ventricle  demonstrates global hypokinesis. Left ventricular diastolic parameters are  consistent with Grade II diastolic  dysfunction (pseudonormalization).   2. Right ventricular systolic function is normal. The right ventricular  size is normal. There is moderately elevated pulmonary artery systolic  pressure. The estimated right ventricular systolic pressure is 567.3mmHg.   3. Left atrial size was moderately dilated.   4. Right atrial size was moderately dilated.   5. The mitral valve is normal in structure. Mild mitral valve  regurgitation. No evidence of mitral stenosis.   6. Tricuspid valve regurgitation is moderate to severe.   7. The aortic valve is normal in structure. There is moderate  calcification of the aortic valve. There is mild thickening of the aortic  valve. Aortic valve regurgitation is mild. Mild to moderate aortic valve  sclerosis/calcification is present,  without  any evidence of aortic stenosis.   8. The inferior vena cava is dilated in size with <50% respiratory  variability, suggesting right atrial pressure of 15 mmHg.   Recent Labs: 09/21/2021: B Natriuretic Peptide 862.4 09/28/2021: ALT 15; Hemoglobin 14.3; Magnesium 2.1; Platelets 168 09/29/2021: BUN 35; Creatinine, Ser 1.16; Potassium 4.8; Sodium 137  No results found for requested labs within last 365 days.   CrCl cannot be calculated (Patient's most recent lab result is older than the maximum 21 days allowed.).   Wt Readings from Last 3 Encounters:  10/21/21 134 lb (60.8 kg)  09/28/21 127 lb 3.3 oz (57.7 kg)  09/04/20 129 lb 3 oz (58.6 kg)     Other studies reviewed: Additional studies/records reviewed today include: summarized above  ASSESSMENT AND PLAN:  CRT-P Device battery reached EOS status as discuss above I discussed with Dr. CCurt Bears(DOD) and [employer] rep. [employer] reports that given she is still at appropriate AMS rate that battery life is not likely have imminent failure, would not delay generator any longer then possible Dr. CCurt Bearsrecommends getting plans for next week done.  This was discussed with JJeneen Rinkshere today, including the procedure, potential risks and benefits, he planned to have Sarah reach out to discuss further  I was able to talk with SJudson Rochafter the patient left, discussed pacing indication/known hx of CHB and unknown if she is/is not dependent and battery status. Discussed the generator change procedure, potential risks/benefits, and recommend that we pursue the procedure sooner rather then later, by next week. She will talk with her siblings and get back to me.  CAD No clear symptoms No ischemic EKG changes   ICM Chronic CHF She has some edema, though looks very comfortable, lungs are clear No changes today Will get labs, lytes  PMVT/Torsades ? Dynammic QT prolongation I have request that consideration for her lexapro and or Zyprexa try to  be down-titrated or eventually syopped if able QTc manually by myself is accounting for QRS duration [id number]/labs today  6. Paroxysmal Afib CHA2DS2Vasc is 5, on Eliquis, appropriately dosed unknown burden, she is in AF today    Disposition: will await SaJudson Rochnd get scheduling of her procedure done.  Current medicines are reviewed at length with the patient today.  The patient did not have any concerns regarding medicines.  SiVenetia NightPA-C 10/21/2021 1:16 PM     [facility] [address] [place] [phone])  ([phone]  fax)

## 2021-10-21 ENCOUNTER — Ambulatory Visit (INDEPENDENT_AMBULATORY_CARE_PROVIDER_SITE_OTHER): Payer: Medicare Other | Admitting: Physician Assistant

## 2021-10-21 ENCOUNTER — Encounter: Payer: Self-pay | Admitting: Physician Assistant

## 2021-10-21 ENCOUNTER — Non-Acute Institutional Stay: Payer: Self-pay | Admitting: Internal Medicine

## 2021-10-21 VITALS — BP 112/64 | HR 69 | Ht 66.0 in | Wt 134.0 lb

## 2021-10-21 DIAGNOSIS — Z45010 Encounter for checking and testing of cardiac pacemaker pulse generator [battery]: Secondary | ICD-10-CM

## 2021-10-21 DIAGNOSIS — I5022 Chronic systolic (congestive) heart failure: Secondary | ICD-10-CM | POA: Diagnosis not present

## 2021-10-21 DIAGNOSIS — I480 Paroxysmal atrial fibrillation: Secondary | ICD-10-CM | POA: Diagnosis not present

## 2021-10-21 DIAGNOSIS — I2510 Atherosclerotic heart disease of native coronary artery without angina pectoris: Secondary | ICD-10-CM | POA: Diagnosis not present

## 2021-10-21 DIAGNOSIS — I255 Ischemic cardiomyopathy: Secondary | ICD-10-CM

## 2021-10-21 NOTE — Patient Instructions (Signed)
Medication Instructions:  Your physician recommends that you continue on your current medications as directed. Please refer to the Current Medication list given to you today.  *If you need a refill on your cardiac medications before your next appointment, please call your pharmacy*   Lab Work: TODAY: BMET, CBC, MAG+2 If you have labs (blood work) drawn today and your tests are completely normal, you will receive your results only by: [place] (if you have MyChart) OR A paper copy in the mail If you have any lab test that is abnormal or we need to change your treatment, we will call you to review the results.   Testing/Procedures: NONE   Follow-Up: At [facility], you and your health needs are our priority.  As part of our continuing mission to provide you with exceptional heart care, we have created designated Provider Care Teams.  These Care Teams include your primary Cardiologist (physician) and Advanced Practice Providers (APPs -  Physician Assistants and Nurse Practitioners) who all work together to provide you with the care you need, when you need it.  We recommend signing up for the patient portal called "MyChart".  Sign up information is provided on this After Visit Summary.  MyChart is used to connect with patients for Virtual Visits (Telemedicine).  Patients are able to view lab/test results, encounter notes, upcoming appointments, etc.  Non-urgent messages can be sent to your provider as well.   To learn more about what you can do with MyChart, go to [email or web address].    Your next appointment:   WILL CALL WITH A SCHEDULED FOLLOW-UP  If primary card or EP is not listed click here to update    :1}    Other Instructions TRY TO TAPER OFF AND DISCONTINUE IF ABLE OLANZAPINE. AVOID UP-TITRATION OF OLANZAPINE AND ESCITALOPRAM. AVOID QT PROLONGING AGENTS.  Important Information About Sugar

## 2021-10-22 ENCOUNTER — Telehealth: Payer: Self-pay

## 2021-10-22 LAB — BASIC METABOLIC PANEL
BUN/Creatinine Ratio: 16 (ref 12–28)
BUN: 17 mg/dL (ref 8–27)
CO2: 27 mmol/L (ref 20–29)
Calcium: 9.3 mg/dL (ref 8.7–10.3)
Chloride: 106 mmol/L (ref 96–106)
Creatinine, Ser: 1.04 mg/dL — ABNORMAL HIGH (ref 0.57–1.00)
Glucose: 87 mg/dL (ref 70–99)
Potassium: 4.4 mmol/L (ref 3.5–5.2)
Sodium: 146 mmol/L — ABNORMAL HIGH (ref 134–144)
eGFR: 52 mL/min/{1.73_m2} — ABNORMAL LOW (ref >59)

## 2021-10-22 LAB — CBC
Hematocrit: 40.1 % (ref 34.0–46.6)
Hemoglobin: 13 g/dL (ref 11.1–15.9)
MCH: 30.3 pg (ref 26.6–33.0)
MCHC: 32.4 g/dL (ref 31.5–35.7)
MCV: 94 fL (ref 79–97)
Platelets: 114 10*3/uL — ABNORMAL LOW (ref 150–450)
RBC: 4.29 x10E6/uL (ref 3.77–5.28)
RDW: 13.4 % (ref 11.7–15.4)
WBC: 6.5 10*3/uL (ref 3.4–10.8)

## 2021-10-22 LAB — MAGNESIUM: Magnesium: 1.8 mg/dL (ref 1.6–2.3)

## 2021-10-22 NOTE — Telephone Encounter (Signed)
Spoke with pt's daughter, DPR and reviewed surgical instruction letters.  See letter for complete details.  Copy of letter released to pt's MyChart.  Pt's daughter verbalizes understanding and agrees with current plan.

## 2021-10-23 ENCOUNTER — Other Ambulatory Visit: Payer: Self-pay

## 2021-10-23 ENCOUNTER — Telehealth: Payer: Self-pay | Admitting: Physician Assistant

## 2021-10-23 MED ORDER — MAGNESIUM OXIDE -MG SUPPLEMENT 200 MG PO TABS: 200.0000 mg | ORAL_TABLET | Freq: Every day | ORAL | 3 refills | Status: AC

## 2021-10-23 NOTE — Telephone Encounter (Signed)
Patient's daughter [name] is calling stating the medication change [name] is making due to the patients lab results needs to be faxed to [facility] so they start the patient on it.

## 2021-10-23 NOTE — Telephone Encounter (Signed)
Informed [name] that we faxed this in this morning. She appreciates [place] letting her know. She appreciates our help with this.

## 2021-10-23 NOTE — Telephone Encounter (Signed)
Device Instruction letter, Preparing for Surgery letter and order to hold Eliquis and Spironolactone faxed to [phone] [place] at [facility] at the request of pt's daughter, Maggie Schwalbe, [place]

## 2021-10-28 ENCOUNTER — Other Ambulatory Visit: Payer: Self-pay

## 2021-10-28 ENCOUNTER — Encounter (HOSPITAL_COMMUNITY)
Admission: RE | Disposition: A | Discharge status: Skilled Nursing Facility | Payer: Self-pay | Source: Home / Self Care | Attending: Internal Medicine

## 2021-10-28 ENCOUNTER — Ambulatory Visit (HOSPITAL_COMMUNITY)
Admission: RE | Admit: 2021-10-28 | Discharge: 2021-10-28 | Disposition: A | Discharge status: Skilled Nursing Facility | Payer: Medicare Other | Attending: Internal Medicine | Admitting: Internal Medicine

## 2021-10-28 DIAGNOSIS — F03C0 Unspecified dementia, severe, without behavioral disturbance, psychotic disturbance, mood disturbance, and anxiety: Secondary | ICD-10-CM | POA: Insufficient documentation

## 2021-10-28 DIAGNOSIS — I2510 Atherosclerotic heart disease of native coronary artery without angina pectoris: Secondary | ICD-10-CM | POA: Insufficient documentation

## 2021-10-28 DIAGNOSIS — Z955 Presence of coronary angioplasty implant and graft: Secondary | ICD-10-CM | POA: Insufficient documentation

## 2021-10-28 DIAGNOSIS — I509 Heart failure, unspecified: Secondary | ICD-10-CM | POA: Diagnosis not present

## 2021-10-28 DIAGNOSIS — I480 Paroxysmal atrial fibrillation: Secondary | ICD-10-CM | POA: Insufficient documentation

## 2021-10-28 DIAGNOSIS — Z45010 Encounter for checking and testing of cardiac pacemaker pulse generator [battery]: Secondary | ICD-10-CM | POA: Insufficient documentation

## 2021-10-28 DIAGNOSIS — I4721 Torsades de pointes: Secondary | ICD-10-CM | POA: Insufficient documentation

## 2021-10-28 DIAGNOSIS — Z7901 Long term (current) use of anticoagulants: Secondary | ICD-10-CM | POA: Insufficient documentation

## 2021-10-28 DIAGNOSIS — I110 Hypertensive heart disease with heart failure: Secondary | ICD-10-CM | POA: Diagnosis not present

## 2021-10-28 DIAGNOSIS — Z951 Presence of aortocoronary bypass graft: Secondary | ICD-10-CM | POA: Insufficient documentation

## 2021-10-28 DIAGNOSIS — I442 Atrioventricular block, complete: Secondary | ICD-10-CM | POA: Diagnosis not present

## 2021-10-28 HISTORY — PX: BIV PACEMAKER GENERATOR CHANGEOUT: EP1198

## 2021-10-28 SURGERY — BIV PACEMAKER GENERATOR CHANGEOUT

## 2021-10-28 MED ORDER — POVIDONE-IODINE 10 % EX SWAB
2.0000 | Freq: Once | CUTANEOUS | Status: AC
  Administered 2021-10-28: 2 via TOPICAL

## 2021-10-28 MED ORDER — CEFAZOLIN SODIUM-DEXTROSE 2-4 GM/100ML-% IV SOLN
2.0000 g | INTRAVENOUS | Status: AC
  Administered 2021-10-28: 2 g via INTRAVENOUS

## 2021-10-28 MED ORDER — ACETAMINOPHEN 325 MG PO TABS: 325.0000 mg | ORAL_TABLET | ORAL | Status: DC | PRN

## 2021-10-28 MED ORDER — SODIUM CHLORIDE 0.9 % IV SOLN: INTRAVENOUS | Status: DC

## 2021-10-28 MED ORDER — CHLORHEXIDINE GLUCONATE 4 % EX LIQD
4.0000 | Freq: Once | CUTANEOUS | Status: DC
  Filled 2021-10-28: qty 60

## 2021-10-28 MED ORDER — SODIUM CHLORIDE 0.9 % IV SOLN
80.0000 mg | INTRAVENOUS | Status: AC
  Administered 2021-10-28: 80 mg

## 2021-10-28 MED ORDER — LIDOCAINE HCL (PF) 1 % IJ SOLN
INTRAMUSCULAR | Status: AC
  Filled 2021-10-28: qty 60

## 2021-10-28 MED ORDER — SODIUM CHLORIDE 0.9 % IV SOLN
INTRAVENOUS | Status: AC
  Filled 2021-10-28: qty 2

## 2021-10-28 MED ORDER — CEFAZOLIN SODIUM-DEXTROSE 2-4 GM/100ML-% IV SOLN
INTRAVENOUS | Status: AC
  Filled 2021-10-28: qty 100

## 2021-10-28 MED ORDER — LIDOCAINE HCL (PF) 1 % IJ SOLN
INTRAMUSCULAR | Status: DC | PRN
  Administered 2021-10-28: 60 mL

## 2021-10-28 SURGICAL SUPPLY — 6 items
CABLE SURGICAL S-101-97-12 (CABLE) ×3 IMPLANT
HEMOSTAT SURGICEL 2X4 FIBR (HEMOSTASIS) ×1 IMPLANT
PACEMAKER QUDR ALLR CRT PM3562 (Pacemaker) IMPLANT
PAD DEFIB RADIO PHYSIO CONN (PAD) ×3 IMPLANT
PMKR QUADRA ALLURE CRT PM3562 (Pacemaker) ×2 IMPLANT
TRAY PACEMAKER INSERTION (PACKS) ×3 IMPLANT

## 2021-10-28 NOTE — Discharge Instructions (Signed)

## 2021-10-28 NOTE — Progress Notes (Signed)
Pt was assisted by son in law with meal-pt incontinent of urine-adult diaper change and redress by staff x 2-left UE shoulder immobilizer in place-IV to let AC d/c at time of d/c

## 2021-10-28 NOTE — Interval H&P Note (Signed)
History and Physical Interval Note:  10/28/2021 8:41 AM  [name] [name] Seen  has presented today for surgery, with the diagnosis of eri.  The various methods of treatment have been discussed with the patient and family. After consideration of risks, benefits and other options for treatment, the patient has consented to  Procedure(s): [place] (N/A) as a surgical intervention.  The patient's history has been reviewed, patient examined, no change in status, stable for surgery.  I have reviewed the patient's chart and labs.  Questions were answered to the patient's satisfaction.     Virl Axe  (As above) Dementia with device dependence at EOS 4/23    Decision has been made to proceed with device generator replacement Risks reviewed With dementia will do with local anesthesia only

## 2021-10-29 ENCOUNTER — Encounter (HOSPITAL_COMMUNITY): Payer: Self-pay | Admitting: Internal Medicine

## 2021-11-05 ENCOUNTER — Ambulatory Visit (INDEPENDENT_AMBULATORY_CARE_PROVIDER_SITE_OTHER): Payer: Medicare Other

## 2021-11-05 DIAGNOSIS — I255 Ischemic cardiomyopathy: Secondary | ICD-10-CM

## 2021-11-05 LAB — CUP PACEART INCLINIC DEVICE CHECK
Battery Remaining Longevity: 91 mo
Battery Voltage: 3.13 V
Brady Statistic RA Percent Paced: 0 %
Brady Statistic RV Percent Paced: 99.88 %
Date Time Interrogation Session: 20230727144900
Implantable Lead Implant Date: 20141024
Implantable Lead Implant Date: 20141024
Implantable Lead Implant Date: 20141024
Implantable Lead Location: 753858
Implantable Lead Location: 753859
Implantable Lead Location: 753860
Implantable Pulse Generator Implant Date: 20230719
Lead Channel Impedance Value: 325 Ohm
Lead Channel Impedance Value: 750 Ohm
Lead Channel Pacing Threshold Amplitude: 1 V
Lead Channel Pacing Threshold Amplitude: 1 V
Lead Channel Pacing Threshold Pulse Width: 0.4 ms
Lead Channel Pacing Threshold Pulse Width: 0.5 ms
Lead Channel Setting Pacing Amplitude: 2 V
Lead Channel Setting Pacing Amplitude: 2.5 V
Lead Channel Setting Pacing Pulse Width: 0.4 ms
Lead Channel Setting Pacing Pulse Width: 0.5 ms
Lead Channel Setting Sensing Sensitivity: 5 mV
Pulse Gen Model: 3562
Pulse Gen Serial Number: 8099635

## 2021-11-05 NOTE — Patient Instructions (Addendum)
   After Your Pacemaker   Monitor your pacemaker site for redness, swelling, and drainage. Call the device clinic at [phone] if you experience these symptoms or fever/chills.  Your incision was closed with Steri-strips or staples:  You may shower 7 days after your procedure and wash your incision with soap and water. Avoid lotions, ointments, or perfumes over your incision until it is well-healed.  You may use a hot tub or a pool after your wound check appointment if the incision is completely closed.  Do not lift, push or pull greater than 10 pounds with the affected arm until 6 weeks after your procedure. There are no other restrictions in arm movement after your wound check appointment. Until AFTER AUGUST 30  You may drive, unless driving has been restricted by your healthcare providers.  Your Pacemaker is MRI compatible.  Remote monitoring is used to monitor your pacemaker from home. This monitoring is scheduled every 91 days by our office. It allows [place] to keep an eye on the functioning of your device to ensure it is working properly. You will routinely see your Electrophysiologist annually (more often if necessary).

## 2021-11-05 NOTE — Progress Notes (Signed)

## 2021-11-13 ENCOUNTER — Encounter: Payer: Self-pay | Admitting: Family Medicine

## 2021-11-13 ENCOUNTER — Non-Acute Institutional Stay: Payer: Medicare Other | Admitting: Family Medicine

## 2021-11-13 VITALS — BP 122/78 | HR 71 | Resp 16

## 2021-11-13 DIAGNOSIS — F03918 Unspecified dementia, unspecified severity, with other behavioral disturbance: Secondary | ICD-10-CM

## 2021-11-13 DIAGNOSIS — W19XXXA Unspecified fall, initial encounter: Secondary | ICD-10-CM | POA: Insufficient documentation

## 2021-11-13 DIAGNOSIS — W19XXXS Unspecified fall, sequela: Secondary | ICD-10-CM

## 2021-11-13 NOTE — Progress Notes (Signed)
[place] Consult Note Telephone: [phone]  Fax: [phone]   Date of encounter: 11/13/21 3:31 PM PATIENT NAME: [name] [name] [place] [address]   [phone] (home)  DOB: Feb 22, 1932 MRN: [id number] PRIMARY CARE PROVIDER:    Earmon Phoenix, DNP,     REFERRING PROVIDER:   No referring provider defined for this encounter. N/A  RESPONSIBLE PARTY:    Contact Information     Name Relation Home Work [name] [name] [phone]   Heminger,Akiera Allbaugh [name]   [phone]   Rutherford Guys   [phone]   Groce,Christopher    [phone]        I met face to face with patient in Assisted living facility. Palliative Care was asked to follow this patient by consultation request of  No ref. provider found to address advance care planning and complex medical decision making. This is the initial visit.          ASSESSMENT, SYMPTOM MANAGEMENT AND PLAN / RECOMMENDATIONS:  Dementia FAST 7 score 6e Has MOST with goals of care in place. Being followed by psychiatry with Melatonin for sleep, Zyprexa for mood stability and Namenda for dementia.  2.  Falls Ambulation ability improving stability with PT and use of rolling walker, progressing from [facility] Continue PT directed therapy with pt making progress.    Follow up Palliative Care Visit: Palliative care will continue to follow for complex medical decision making, advance care planning, and clarification of goals. Return 4 weeks or prn.    This visit was coded based on medical decision making (MDM).  PPS: 60%  HOSPICE ELIGIBILITY/DIAGNOSIS: TBD  Chief Complaint:  [facility] received a referral to follow up with patient for chronic disease management in setting of dementia.  HISTORY OF PRESENT ILLNESS:  [name] [name] is a 88 y.o. year old female with dementia with  behavioral disturbances.  She had a recent ED visit where she was hypotensive after a fall.  She presented a second time that day to the ER with possible seizure activity with loss of consciousness, resulting in admission due to polymorphic vtach thought to be due to QT prolonging meds and hypokalemia.  Tanna Furry was treated with an amiodarone infusion and she was treated empirically for UTI with fosfomycin. She was subsequently started on Metoprolol and Spironolactone. She was sent to [facility] rehab for deconditioning. She was noted to have a reduced EF 30-35% on echo and electrolyte abnormalities.  She denies pain, states she cannot stand for a long time because she will lose her balance. Denies SOB and states if she has trouble sleeping that there are things that she can do.    History obtained from review of EMR, discussion with primary team, and interview with family, facility staff/caregiver and/or [name] [name].   CBC    Component Value Date/Time   WBC 6.5 10/21/2021 1119   WBC 8.0 09/28/2021 0257   RBC 4.29 10/21/2021 1119   RBC 4.63 09/28/2021 0257   HGB 13.0 10/21/2021 1119   HCT 40.1 10/21/2021 1119   PLT 114 (L) 10/21/2021 1119   MCV 94 10/21/2021 1119   MCH 30.3 10/21/2021 1119   MCH 30.9 09/28/2021 0257   MCHC 32.4 10/21/2021 1119   MCHC 32.9 09/28/2021 0257   RDW 13.4 10/21/2021 1119   LYMPHSABS 1.9 09/28/2021 0257   MONOABS 0.6 09/28/2021 0257   EOSABS 0.4 09/28/2021 0257   BASOSABS 0.0 09/28/2021  0257    CMP     Component Value Date/Time   NA 146 (H) 10/21/2021 1119   K 4.4 10/21/2021 1119   CL 106 10/21/2021 1119   CO2 27 10/21/2021 1119   GLUCOSE 87 10/21/2021 1119   GLUCOSE 104 (H) 09/29/2021 0804   BUN 17 10/21/2021 1119   CREATININE 1.04 (H) 10/21/2021 1119   CREATININE 0.79 05/24/2017 1659   CALCIUM 9.3 10/21/2021 1119   PROT 5.8 (L) 09/28/2021 0257   ALBUMIN 3.2 (L) 09/28/2021 0257   AST 15 09/28/2021 0257   ALT 15 09/28/2021 0257   ALKPHOS 61 09/28/2021 0257    BILITOT 1.1 09/28/2021 0257   GFRNONAA 46 (L) 09/29/2021 0804   GFRNONAA 69 05/24/2017 1659   GFRAA 59 (L) 07/31/2019 1107   GFRAA 80 05/24/2017 1659   BNP (last 3 results) Recent Labs    09/21/21 1432  BNP 862.4*    I reviewed available labs, medications, imaging, studies and related documents from the EMR.  Records reviewed and summarized above.   ROS-limited by dementia, obtained from pt and staff General: NAD ENMT: denies dysphagia Cardiovascular: denies chest pain, denies DOE Pulmonary: denies cough, denies increased SOB Abdomen: endorses good appetite Neurological: denies pain, denies insomnia Facility staff indicates she is incontinent of bowel and bladder but able to let someone know she has been, requires assist for bathing and dressing.  Ambulates with a rolling walker and continues working with PT.  Staff reports good appetite.  Physical Exam: Current and past weights: 125.4 lbs as of 10/05/21 Constitutional: NAD General: frail appearing, thin ENMT: very hard of hearing CV: S1S2, RRR, no LE edema Pulmonary: CTAB, no increased work of breathing, no cough, room air Abdomen: normo-active BS + 4 quadrants, soft and non tender, no ascites MSK: no sarcopenia, moves all extremities, ambulatory Skin: warm and dry, no rashes or wounds on visible skin Neuro:  no generalized weakness, noted cognitive impairment Psych: non-anxious affect, A and O x 1 Hem/lymph/immuno: no widespread bruising  CURRENT PROBLEM LIST:  Patient Active Problem List   Diagnosis Date Noted   AKI (acute kidney injury) ([place]) 09/28/2021   Nausea & vomiting 09/27/2021   Seizure-like activity ([place]) 09/23/2021   Laceration of left upper arm 09/23/2021   Hypomagnesemia 09/23/2021   Asymptomatic bacteriuria 09/23/2021   Fall at nursing home, initial encounter 09/21/2021   DNR (do not resuscitate) 09/21/2021   Polymorphic ventricular tachycardia ([place]) 09/21/2021   Closed fracture of right femur,  unspecified fracture morphology, initial encounter ([place]) 09/02/2020   Fracture of femoral neck, right ([place]) 09/01/2020   Closed hip fracture, right, initial encounter ([place]) 09/01/2020   Chronic obstructive pulmonary disease ([place]) 03/20/2019   Major depressive disorder with single episode, in partial remission ([place]) 03/20/2019   Arthritis of carpometacarpal ([place]) joint of left thumb 06/02/2017   Current use of long term anticoagulation 06/02/2017   HCAP (healthcare-associated pneumonia) 04/04/2017   Traumatic perinephric hematoma of left kidney 03/29/2017   Acute blood loss as cause of postoperative anemia 03/29/2017   Left renal mass 01/27/2017   Overactive bladder 07/09/2016   Dizziness 05/07/2016   Microscopic hematuria 09/02/2015   CAD (coronary artery disease) 04/17/2014   Allergic rhinitis 04/17/2014   Dementia with behavioral disturbance ([place]) 04/17/2014   Hearing loss 04/17/2014   Pacemaker -CRT-St. Jude 05/21/2013   Atrioventricular block, complete ([place]) 03/08/2013   Paroxysmal atrial fibrillation ([place]) 02/03/2013   Asthma, intrinsic 02/03/2013   Chronic combined systolic and diastolic congestive heart failure (  [place]) 10/10/2012   Depression 09/19/2012   Hypokalemia 07/14/2012   Syncope 07/11/2012   Varicose veins 09/29/2009   History of melanoma 09/06/2008   LBBB (left bundle branch block) 09/06/2008   History of uterine cancer 09/06/2008   Hyperlipidemia 10/06/2007   Essential hypertension 10/06/2007   HFrEF (heart failure with reduced ejection fraction) ([place]) 10/06/2007   PAST MEDICAL HISTORY:  Active Ambulatory Problems    Diagnosis Date Noted   History of melanoma 09/06/2008   Hyperlipidemia 10/06/2007   Essential hypertension 10/06/2007   HFrEF (heart failure with reduced ejection fraction) ([place]) 10/06/2007   LBBB (left bundle branch block) 09/06/2008   Varicose veins 09/29/2009   History of uterine cancer 09/06/2008   Syncope 07/11/2012   Hypokalemia  07/14/2012   Depression 09/19/2012   Chronic combined systolic and diastolic congestive heart failure ([place]) 10/10/2012   Paroxysmal atrial fibrillation ([place]) 02/03/2013   Asthma, intrinsic 02/03/2013   Atrioventricular block, complete ([place]) 03/08/2013   Pacemaker -CRT-St. Jude 05/21/2013   CAD (coronary artery disease) 04/17/2014   Allergic rhinitis 04/17/2014   Dementia with behavioral disturbance ([place]) 04/17/2014   Hearing loss 04/17/2014   Microscopic hematuria 09/02/2015   Dizziness 05/07/2016   Overactive bladder 07/09/2016   Left renal mass 01/27/2017   Traumatic perinephric hematoma of left kidney 03/29/2017   Acute blood loss as cause of postoperative anemia 03/29/2017   HCAP (healthcare-associated pneumonia) 04/04/2017   Arthritis of carpometacarpal ([facility]) joint of left thumb 06/02/2017   Current use of long term anticoagulation 06/02/2017   Chronic obstructive pulmonary disease ([place]) 03/20/2019   Major depressive disorder with single episode, in partial remission ([place]) 03/20/2019   Fracture of femoral neck, right ([place]) 09/01/2020   Closed hip fracture, right, initial encounter ([place]) 09/01/2020   Closed fracture of right femur, unspecified fracture morphology, initial encounter ([place]) 09/02/2020   Fall at nursing home, initial encounter 09/21/2021   DNR (do not resuscitate) 09/21/2021   Polymorphic ventricular tachycardia ([place]) 09/21/2021   Seizure-like activity ([place]) 09/23/2021   Laceration of left upper arm 09/23/2021   Hypomagnesemia 09/23/2021   Asymptomatic bacteriuria 09/23/2021   Nausea & vomiting 09/27/2021   AKI (acute kidney injury) ([place]) 09/28/2021   Resolved Ambulatory Problems    Diagnosis Date Noted   MYOCARDIAL INFARCTION, HX OF 10/06/2007   LACERATION 01/26/2010   History of colonic polyps 10/30/2009   TIA (transient ischemic attack) 07/11/2012   Orthostatic hypotension 10/19/2012   Mobitz type 2 second degree AV block 02/01/2013   Acute  encephalopathy 04/04/2017   Past Medical History:  Diagnosis Date   Atrial fibrillation ([place]) 10/06/2007   COPD (chronic obstructive pulmonary disease) ([place])    CORONARY ARTERY DISEASE 10/06/2007   HYPERLIPIDEMIA 10/06/2007   HYPERTENSION 10/06/2007   LBBB 09/06/2008   MELANOMA 09/06/2008   NICM (nonischemic cardiomyopathy) ([place])    PERSONAL HX COLONIC POLYPS 10/30/2009   Presence of permanent cardiac pacemaker    UTERINE CANCER, HX OF 09/06/2008   VARICOSE VEIN 09/29/2009   SOCIAL HX:  Social History   Tobacco Use   Smoking status: Never   Smokeless tobacco: Never  Substance Use Topics   Alcohol use: Yes    Comment:  OCC gin or wine   FAMILY HX:  Family History  Problem Relation Age of Onset   Stroke Mother    Heart attack Father    Colon cancer Neg Hx        Preferred Pharmacy: ALLERGIES:  Allergies  Allergen Reactions   Aricept [Donepezil]  Shortness Of Breath and Other (See Comments)    Hallucinations and loss of sensation in legs and "Allergic," per [facility]   Nitroglycerin Other (See Comments)    Oral spray form causes rapid drop in blood pressure. Patient is sensitive to tabs which causes rapid drop in blood pressure. Is ok to use oral spray form. "Allergic," per [facility]     PERTINENT MEDICATIONS:  Outpatient Encounter Medications as of 11/13/2021  Medication Sig   acetaminophen (TYLENOL) 500 MG tablet Take 1,000 mg by mouth in the morning and at bedtime.   apixaban (ELIQUIS) 2.5 MG TABS tablet Take 1 tablet (2.5 mg total) by mouth 2 (two) times daily.   Cholecalciferol (VITAMIN D3 SUPER STRENGTH) 50 MCG (2000 UT) TABS Take 2,000 Units by mouth daily.   escitalopram (LEXAPRO) 10 MG tablet Take 1 tablet (10 mg total) by mouth daily.   Magnesium Oxide -Mg Supplement (MAG-OXIDE) 200 MG TABS Take 1 tablet (200 mg total) by mouth daily. (2000)   Melatonin 10 MG TABS Take 10 mg by mouth at bedtime.   memantine (NAMENDA) 5 MG tablet Take 5 mg by mouth in the morning and at  bedtime.   metoprolol succinate (TOPROL-XL) 25 MG 24 hr tablet Take 1 tablet (25 mg total) by mouth daily.   OLANZapine (ZYPREXA) 2.5 MG tablet Take 1 tablet (2.5 mg total) by mouth at bedtime.   pyridostigmine (MESTINON) 60 MG tablet TAKE 1 TABLET(60 MG) BY MOUTH DAILY   spironolactone (ALDACTONE) 25 MG tablet Take 0.5 tablets (12.5 mg total) by mouth daily.   No facility-administered encounter medications on file as of 11/13/2021.     ----------------------------------------------------------------------------------------- Advance Care Planning/Goals of Care: Goals include to maximize quality of life and symptom management.  Identification  of a healthcare agent-HC POA-in order alone and in succession: [place] powers issued without limitation except with regard to autopsy and disposal of remains, pt requests cremation. Signed, witnessed and notarized in 2011. Review of an advance directive document-MOST. Decision not to resuscitate or to de-escalate disease focused treatments due to poor prognosis. CODE STATUS: DNR MOST as of 09/09/20: DNR/DNI with limited additional intervention Use of antibiotics and IV fluids if indicated No feeding tube.     Thank you for the opportunity to participate in the care of [name] [name].  The palliative care team will continue to follow. Please call our office at [phone] if we can be of additional assistance.   Marijo Conception, FNP-C  COVID-19 PATIENT SCREENING TOOL Asked and negative response unless otherwise noted:  Have you had symptoms of covid, tested positive or been in contact with someone with symptoms/positive test in the past 5-10 days? No

## 2022-01-29 ENCOUNTER — Ambulatory Visit (INDEPENDENT_AMBULATORY_CARE_PROVIDER_SITE_OTHER): Payer: Medicare Other

## 2022-01-29 DIAGNOSIS — I255 Ischemic cardiomyopathy: Secondary | ICD-10-CM | POA: Diagnosis not present

## 2022-01-29 LAB — CUP PACEART REMOTE DEVICE CHECK
Battery Remaining Longevity: 93 mo
Battery Remaining Percentage: 95.5 %
Battery Voltage: 3.02 V
Date Time Interrogation Session: 20231020021100
Implantable Lead Implant Date: 20141024
Implantable Lead Implant Date: 20141024
Implantable Lead Implant Date: 20141024
Implantable Lead Location: 753858
Implantable Lead Location: 753859
Implantable Lead Location: 753860
Implantable Pulse Generator Implant Date: 20230719
Lead Channel Impedance Value: 330 Ohm
Lead Channel Impedance Value: 780 Ohm
Lead Channel Pacing Threshold Amplitude: 1 V
Lead Channel Pacing Threshold Amplitude: 1 V
Lead Channel Pacing Threshold Pulse Width: 0.4 ms
Lead Channel Pacing Threshold Pulse Width: 0.5 ms
Lead Channel Sensing Intrinsic Amplitude: 12 mV
Lead Channel Setting Pacing Amplitude: 2 V
Lead Channel Setting Pacing Amplitude: 2.5 V
Lead Channel Setting Pacing Pulse Width: 0.4 ms
Lead Channel Setting Pacing Pulse Width: 0.5 ms
Lead Channel Setting Sensing Sensitivity: 5 mV
Pulse Gen Model: 3562
Pulse Gen Serial Number: 8099635

## 2022-02-04 ENCOUNTER — Encounter: Payer: Medicare Other | Admitting: Internal Medicine

## 2022-02-04 DIAGNOSIS — I4729 Other ventricular tachycardia: Secondary | ICD-10-CM

## 2022-02-04 DIAGNOSIS — I442 Atrioventricular block, complete: Secondary | ICD-10-CM

## 2022-02-04 DIAGNOSIS — I480 Paroxysmal atrial fibrillation: Secondary | ICD-10-CM

## 2022-02-04 DIAGNOSIS — I5020 Unspecified systolic (congestive) heart failure: Secondary | ICD-10-CM

## 2022-02-04 DIAGNOSIS — Z950 Presence of cardiac pacemaker: Secondary | ICD-10-CM

## 2022-02-05 NOTE — Progress Notes (Signed)
Remote pacemaker transmission.   

## 2022-03-18 ENCOUNTER — Encounter: Payer: Self-pay | Admitting: Family Medicine

## 2022-03-18 ENCOUNTER — Non-Acute Institutional Stay: Payer: Medicare Other | Admitting: Family Medicine

## 2022-03-18 VITALS — BP 128/78 | HR 78 | Temp 98.0°F | Resp 18

## 2022-03-18 DIAGNOSIS — F03918 Unspecified dementia, unspecified severity, with other behavioral disturbance: Secondary | ICD-10-CM

## 2022-03-18 NOTE — Progress Notes (Signed)
[place] Consult Note Telephone: [phone]  Fax: [phone]    Date of encounter: 03/18/22 4:35 PM PATIENT NAME: [name] [name] [name] [id number]   [phone] (home)  DOB: March 01, 1932 MRN: [id number] PRIMARY CARE PROVIDER:    System, Provider Not In,  No address on file None  REFERRING PROVIDER:   No referring provider defined for this encounter. N/A  RESPONSIBLE PARTY:    Contact Information     Name Relation Home Work [place] Daughter [phone]   Pichardo,Laketia Vicknair Daughter   [phone]   Rutherford Guys   [phone]   Groce,Christopher    [phone]        I met face to face with patient in [facility] living facility. Palliative Care was asked to follow this patient by consultation request of  No ref. provider found to address advance care planning and complex medical decision making. This is a follow up visit   ASSESSMENT , SYMPTOM MANAGEMENT AND PLAN / RECOMMENDATIONS:  Dementia with behavioral disturbance Increase Olanzapine from 2.5 mg daily to BID. Continue to redirect pt and minimize stimulation as much as possible.  Advance Care Planning/Goals of Care: Goals include to maximize quality of life and symptom management.  Identification of a healthcare agent-daughter and [facility] POA-Sarah Groce Review of an advance directive document-MOST. Decision not to resuscitate or to de-escalate disease focused treatments due to poor prognosis. CODE STATUS: MOST as of 09/09/20: DNR/DNI with limited additional intervention Use of antibiotics and IV fluids if indicated No feeding tube.      Follow up Palliative Care Visit: Palliative care will continue to follow for complex medical decision making, advance care planning, and clarification of goals. Return 4 weeks or prn.   This visit was coded based on medical decision  making (MDM).  PPS: 60%  HOSPICE ELIGIBILITY/DIAGNOSIS: TBD  Chief Complaint: Palliative Care is continuing to follow patient for chronic medical management in setting of dementia.   HISTORY OF PRESENT ILLNESS:  [name] [name] is a 88 y.o. year old female with dementia with behavioral disturbance, chronic combined systolic and diastolic CHF with reduced EF, paroxysmal atrial fibrillation, asthma,  polymorphic v tach and complete AV block s/p St Jude pacemaker, left renal mass, hx of uterine cancer and melanoma, depression, hypokalemia and hypomagnesemia and seizure like activity.   Facility staff states that pt has been loud and intrusive for the last 2-3 weeks.  She thinks pt was treated for UTI but has not improved her behavior.  Pt denies pain, SOB, falls.  Doesn't like having her BP taken. She is incontinent of bladder, continent of bowel.  Requires assist, cueing and direction for bathing and dressing. She is observed scolding another resident who is not wearing shoes.  History obtained from review of EMR, discussion with facility staff and/or Ms. [name] [name].      Latest Ref Rng & Units 10/21/2021   11:19 AM 09/28/2021    2:57 AM 09/27/2021    2:08 AM  CBC  WBC 3.4 - 10.8 x10E3/uL 6.5  8.0  8.0   Hemoglobin 11.1 - 15.9 g/dL 13.0  14.3  13.7   Hematocrit 34.0 - 46.6 % 40.1  43.4  43.3   Platelets 150 - 450 x10E3/uL 114  168  166        Latest Ref Rng & Units 10/21/2021   11:19 AM 09/29/2021    8:04 AM 09/28/2021  2:57 AM  CMP  Glucose 70 - 99 mg/dL 87  104  102   BUN 8 - 27 mg/dL 17  35  31   Creatinine 0.57 - 1.00 mg/dL 1.04  1.16  1.24   Sodium 134 - 144 mmol/L 146  137  137   Potassium 3.5 - 5.2 mmol/L 4.4  4.8  4.3   Chloride 96 - 106 mmol/L 106  104  104   CO2 20 - 29 mmol/L _0 Calcium 8.7 - 10.3 mg/dL 9.3  9.2  9.0   Total Protein 6.5 - 8.1 g/dL   5.8   Total Bilirubin 0.3 - 1.2 mg/dL   1.1   Alkaline Phos 38 - 126 U/L   61   AST 15 - 41 U/L   15   ALT 0 - 44 U/L    15   09/21/21 CT head and cervical spine: IMPRESSION: 1. No acute intracranial pathology. 2. No acute fracture or traumatic malalignment of the cervical spine.  09/21/21 Portable CXR: IMPRESSION: Cardiomegaly with vascular congestion. Possible early interstitial edema.  09/02/20 2d echo: Left ventricular EF 35 to 40%. LV has moderately decreased function and global hypokinesis.  Grade II diastolic  dysfunction.  Right ventricular systolic function and size are normal. Moderately elevated estimated right ventricular systolic pressure is 76.1 mmHg.  Moderate biatrial enlargement Tricuspid valve regurgitation is moderate to severe.  Inferior vena cava is dilated in size with <50% respiratory  variability, suggesting right atrial pressure of 15 mmHg.   I reviewed EMR for available labs, medications, imaging, studies and related documents.  There are no new records since last visit/Records reviewed and summarized above.   ROS (limited cooperation from pt) General: NAD ENMT: denies dysphagia Cardiovascular: denies chest pain Pulmonary: denies SOB, staff indicates DOE with ambulation Abdomen: endorses good appetite, denies constipation, endorses continence of bowel GU:  endorses incontinence of urine MSK:  denies increased weakness,  no falls reported Neurological: denies pain   Physical Exam: Current and past weights: 145 lbs as of 10/28/21, no recent weight per facility staff Constitutional: NAD General: frail appearing, thin  ENMT: mildly hard of hearing CV: S1S2, RRR, no LE edema Pulmonary: CTAb, no increased work of breathing, no cough, room air Abdomen:normo-active BS + 4 quadrants, soft and non tender, no ascites GU: deferred MSK: no sarcopenia, moves all extremities, ambulatory with unsteady balance intermittently on standing Skin: warm and dry, no rashes or wounds on visible skin Neuro:  no generalized weakness,  significant cognitive impairment Psych: mod anxious affect,  A and O to self Hem/lymph/immuno: no widespread bruising   Thank you for the opportunity to participate in the care of Ms. [name] [name].  The palliative care team will continue to follow. Please call our office at [phone] if we can be of additional assistance.   Marijo Conception, FNP -C  COVID-19 PATIENT SCREENING TOOL Asked and negative response unless otherwise noted:   Have you had symptoms of covid, tested positive or been in contact with someone with symptoms/positive test in the past 5-10 days?  unknown

## 2022-04-16 ENCOUNTER — Encounter (HOSPITAL_COMMUNITY): Payer: Self-pay

## 2022-04-16 ENCOUNTER — Emergency Department (HOSPITAL_COMMUNITY): Payer: Medicare Other

## 2022-04-16 ENCOUNTER — Other Ambulatory Visit: Payer: Self-pay

## 2022-04-16 ENCOUNTER — Emergency Department (HOSPITAL_COMMUNITY)
Admission: EM | Admit: 2022-04-16 | Discharge: 2022-04-16 | Disposition: A | Discharge status: Skilled Nursing Facility | Payer: Medicare Other | Attending: Emergency Medicine | Admitting: Emergency Medicine

## 2022-04-16 DIAGNOSIS — U071 COVID-19: Secondary | ICD-10-CM | POA: Insufficient documentation

## 2022-04-16 DIAGNOSIS — R059 Cough, unspecified: Secondary | ICD-10-CM | POA: Diagnosis present

## 2022-04-16 DIAGNOSIS — Z79899 Other long term (current) drug therapy: Secondary | ICD-10-CM | POA: Diagnosis not present

## 2022-04-16 DIAGNOSIS — F0390 Unspecified dementia without behavioral disturbance: Secondary | ICD-10-CM | POA: Diagnosis not present

## 2022-04-16 DIAGNOSIS — Z7901 Long term (current) use of anticoagulants: Secondary | ICD-10-CM | POA: Insufficient documentation

## 2022-04-16 LAB — COMPREHENSIVE METABOLIC PANEL
ALT: 14 U/L (ref 0–44)
AST: 21 U/L (ref 15–41)
Albumin: 3.3 g/dL — ABNORMAL LOW (ref 3.5–5.0)
Alkaline Phosphatase: 57 U/L (ref 38–126)
Anion gap: 10 (ref 5–15)
BUN: 22 mg/dL (ref 8–23)
CO2: 28 mmol/L (ref 22–32)
Calcium: 8.6 mg/dL — ABNORMAL LOW (ref 8.9–10.3)
Chloride: 101 mmol/L (ref 98–111)
Creatinine, Ser: 0.98 mg/dL (ref 0.44–1.00)
GFR, Estimated: 56 mL/min — ABNORMAL LOW (ref >60)
Glucose, Bld: 117 mg/dL — ABNORMAL HIGH (ref 70–99)
Potassium: 4.5 mmol/L (ref 3.5–5.1)
Sodium: 139 mmol/L (ref 135–145)
Total Bilirubin: 1.3 mg/dL — ABNORMAL HIGH (ref 0.3–1.2)
Total Protein: 6.1 g/dL — ABNORMAL LOW (ref 6.5–8.1)

## 2022-04-16 LAB — CBC WITH DIFFERENTIAL/PLATELET
Abs Immature Granulocytes: 0.02 10*3/uL (ref 0.00–0.07)
Basophils Absolute: 0 10*3/uL (ref 0.0–0.1)
Basophils Relative: 0 %
Eosinophils Absolute: 0 10*3/uL (ref 0.0–0.5)
Eosinophils Relative: 0 %
HCT: 36.5 % (ref 36.0–46.0)
Hemoglobin: 11.6 g/dL — ABNORMAL LOW (ref 12.0–15.0)
Immature Granulocytes: 0 %
Lymphocytes Relative: 9 %
Lymphs Abs: 0.8 10*3/uL (ref 0.7–4.0)
MCH: 30.9 pg (ref 26.0–34.0)
MCHC: 31.8 g/dL (ref 30.0–36.0)
MCV: 97.3 fL (ref 80.0–100.0)
Monocytes Absolute: 1 10*3/uL (ref 0.1–1.0)
Monocytes Relative: 11 %
Neutro Abs: 6.8 10*3/uL (ref 1.7–7.7)
Neutrophils Relative %: 80 %
Platelets: 98 10*3/uL — ABNORMAL LOW (ref 150–400)
RBC: 3.75 MIL/uL — ABNORMAL LOW (ref 3.87–5.11)
RDW: 14 % (ref 11.5–15.5)
WBC: 8.7 10*3/uL (ref 4.0–10.5)
nRBC: 0 % (ref 0.0–0.2)

## 2022-04-16 MED ORDER — MOLNUPIRAVIR EUA 200MG CAPSULE
4.0000 | ORAL_CAPSULE | Freq: Two times a day (BID) | ORAL | 0 refills | Status: AC
Start: 2022-04-16 — End: 2022-04-21

## 2022-04-16 NOTE — Discharge Instructions (Addendum)
With your new signs of COVID it is very important that you monitor your condition carefully.  Do not hesitate to return here for any concerning changes in your condition.  Please be sure to take all of your other medication including her spironolactone regularly.  Follow-up with your physician or return here for concerning changes.  You may consider discussion with your physician about Lasix dosing as well.

## 2022-04-16 NOTE — ED Notes (Signed)
Ptar called, 2nd in line

## 2022-04-16 NOTE — ED Notes (Signed)
Daughter called and updated on discharge and PTAR status.

## 2022-04-16 NOTE — ED Provider Notes (Signed)
[facility] EMERGENCY DEPARTMENT Provider Note   CSN: [id number] Arrival date & time: 04/16/22  1225     History  No chief complaint on file.   [name] [name] is a 89 y.o. female.  HPI Patient presents from a nursing facility via EMS due to concern for COVID-positive status, cough.  Patient self has dementia, level 5 caveat.  Though she is initially alone she is subsequently joined by her daughter who is a Designer, jewellery in critical care.  Daughter notes that the patient is interacting in typical manner, was well about a week ago, there is no obvious time of onset of symptoms.  Per EMS patient was diagnosed COVID at her facility, and with concern, unspecified, she was sent here for evaluation.     Home Medications Prior to Admission medications   Medication Sig Start Date End Date Taking? Authorizing Provider  acetaminophen (TYLENOL) 500 MG tablet Take 1,000 mg by mouth in the morning and at bedtime. (0800, 2000)   Yes [provider]  apixaban (ELIQUIS) 2.5 MG TABS tablet Take 1 tablet (2.5 mg total) by mouth 2 (two) times daily. Patient taking differently: Take 2.5 mg by mouth 2 (two) times daily. (0800, 2000) 09/29/21 06/19/22 Yes Elodia Florence., MD  Cholecalciferol (VITAMIN D3 SUPER STRENGTH) 50 MCG (2000 UT) TABS Take 2,000 Units by mouth in the morning. (0800)   Yes [provider]  divalproex (DEPAKOTE SPRINKLE) 125 MG capsule Take 125 mg by mouth 2 (two) times daily. (0800, 2000)   Yes [provider]  escitalopram (LEXAPRO) 10 MG tablet Take 1 tablet (10 mg total) by mouth daily. Patient taking differently: Take 10 mg by mouth in the morning. (0800) 09/29/21 06/19/22 Yes Elodia Florence., MD  guaiFENesin 200 MG tablet Take 200-400 mg by mouth every 4 (four) hours as needed (congestion).   Yes [provider]  Magnesium Oxide -Mg Supplement (MAG-OXIDE) 200 MG TABS Take 1 tablet (200 mg total) by mouth daily.  (2000) Patient taking differently: Take 200 mg by mouth every other day. (2000) 10/23/21  Yes Baldwin Jamaica, PA-C  Melatonin 10 MG TABS Take 10 mg by mouth at bedtime. (2000)   Yes [provider]  memantine (NAMENDA) 5 MG tablet Take 5 mg by mouth in the morning and at bedtime. (0800, 2000) 08/22/20  Yes [provider]  metoprolol succinate (TOPROL-XL) 25 MG 24 hr tablet Take 1 tablet (25 mg total) by mouth daily. Patient taking differently: Take 25 mg by mouth in the morning. (0800) 09/29/21 06/19/22 Yes Elodia Florence., MD  molnupiravir EUA (LAGEVRIO) 200 mg CAPS capsule Take 4 capsules (800 mg total) by mouth 2 (two) times daily for 5 days. 04/16/22 04/21/22 Yes Carmin Muskrat, MD  OLANZapine (ZYPREXA) 2.5 MG tablet Take 1 tablet (2.5 mg total) by mouth at bedtime. Patient taking differently: Take 2.5 mg by mouth 2 (two) times daily. (0800, 2000) 09/29/21 06/19/22 Yes Elodia Florence., MD  pyridostigmine (MESTINON) 60 MG tablet TAKE 1 TABLET(60 MG) BY MOUTH DAILY Patient taking differently: Take 60 mg by mouth in the morning. (0800) 08/06/19  Yes Marin Olp, MD  spironolactone (ALDACTONE) 25 MG tablet Take 0.5 tablets (12.5 mg total) by mouth daily. Patient taking differently: Take 12.5 mg by mouth in the morning. (0800) 09/29/21 06/19/22 Yes Elodia Florence., MD      Allergies    Aricept [donepezil] and Nitrostat [nitroglycerin]    Review of  Systems   Review of Systems  Unable to perform ROS: Dementia    Physical Exam Updated Vital Signs BP (!) 147/62   Pulse 76   Temp 98.4 F (36.9 C) (Oral)   Resp 20   LMP  (LMP Unknown)   SpO2 96%  Physical Exam Vitals and nursing note reviewed.  Constitutional:      General: She is not in acute distress.    Appearance: She is well-developed. She is not ill-appearing, toxic-appearing or diaphoretic.  HENT:     Head: Normocephalic and atraumatic.  Eyes:     Conjunctiva/sclera: Conjunctivae normal.   Cardiovascular:     Rate and Rhythm: Normal rate and regular rhythm.  Pulmonary:     Effort: Pulmonary effort is normal. No respiratory distress.     Breath sounds: Normal breath sounds. No stridor.  Abdominal:     General: There is no distension.  Skin:    General: Skin is warm and dry.  Neurological:     Mental Status: She is alert.     Cranial Nerves: No cranial nerve deficit.     Comments: Patient does not follow commands reliably but does move all extremity spontaneously, has symmetric facial features, no obvious cranial nerve deficiencies.  Psychiatric:     Comments: Withdrawn, stigmata of dementia obvious, though the patient does occasionally answer some questions appropriately.     ED Results / Procedures / Treatments   Labs (all labs ordered are listed, but only abnormal results are displayed) Labs Reviewed  COMPREHENSIVE METABOLIC PANEL - Abnormal; Notable for the following components:      Result Value   Glucose, Bld 117 (*)    Calcium 8.6 (*)    Total Protein 6.1 (*)    Albumin 3.3 (*)    Total Bilirubin 1.3 (*)    GFR, Estimated 56 (*)    All other components within normal limits  CBC WITH DIFFERENTIAL/PLATELET - Abnormal; Notable for the following components:   RBC 3.75 (*)    Hemoglobin 11.6 (*)    Platelets 98 (*)    All other components within normal limits    EKG None  Radiology DG Chest Port 1 View  Result Date: 04/16/2022 CLINICAL DATA:  Shortness of breath EXAM: PORTABLE CHEST 1 VIEW COMPARISON:  Multiple chest x-rays, most recently September 21, 2021 FINDINGS: Intact left chest wall triple lead pacemaker. Stable cardiomediastinal contours status post median sternotomy. Mild basilar predominant interstitial pulmonary opacities. No large pleural effusion pneumothorax. Visualized upper abdomen is unremarkable. No acute osseous abnormality. IMPRESSION: Mild pulmonary edema. Electronically Signed   By: Beryle Flock M.D.   On: 04/16/2022 17:31     Procedures Procedures    Medications Ordered in ED Medications - No data to display  ED Course/ Medical Decision Making/ A&P This patient with a Hx of multiple medical issues and is notably dementia, pulmonary disease, heart failure presents to the ED for concern of COVID, possible increased work of breathing, this involves an extensive number of treatment options, and is a complaint that carries with it a high risk of complications and morbidity.    The differential diagnosis includes COVID, bacteremia, sepsis, pneumonia, progression of disease   Social Determinants of Health:  Dementia, advanced age, nursing home residency  Additional history obtained:  Additional history and/or information obtained from daughter at bedside, notable for details included above   After the initial evaluation, orders, including: X-ray labs monitoring were initiated.   Patient placed on Cardiac and Pulse-Oximetry Monitors.  The patient was maintained on a cardiac monitor.  The cardiac monitored showed an rhythm of 75 sinus normal The patient was also maintained on pulse oximetry. The readings were typically 97% room air normal   On repeat evaluation of the patient stayed the same  Lab Tests:  I personally interpreted labs.  The pertinent results include: Unremarkable labs, mild anemia  Imaging Studies ordered:  I independently visualized and interpreted imaging which showed pulmonary congestion on x-ray I agree with the radiologist interpretation Dispostion / Final MDM:  After consideration of the diagnostic results and the patient's response to treatment, this adult female with multiple medical problems, most prominently dementia presents with known COVID infection.  Here patient is interacting in a typical manner according to family members, has no new oxygen requirement, no hypotension, no fever.  No evidence for bacteremia, sepsis.  I discussed findings with the patient's daughter,  including pulmonary congestion, the patient is on spironolactone, has possibly been on as needed Lasix, and daughter will monitor this, possibly consider more regular dosing with her physician.  Absent other alarming findings, patient appropriate for return to her nursing facility the hospitalization was a consideration given her age, comorbidities.  Final Clinical Impression(s) / ED Diagnoses Final diagnoses:  COVID    Rx / DC Orders ED Discharge Orders          Ordered    molnupiravir EUA (LAGEVRIO) 200 mg CAPS capsule  2 times daily        04/16/22 2031              Carmin Muskrat, MD 04/16/22 2118

## 2022-04-16 NOTE — ED Triage Notes (Signed)
Pt BIB [facility] from [facility] d/t testing positive at the facility today. Staff there said she was fever & O2 was dropping but unable to give any actual v/s. EMS reports pt has dementia at baseline & she was laying under many blankets. 126/100, 94% on RA (finger nails polish causing difficulty in reading her pulse ox), 86 bpm, afebrile, LS clear with unproductive cough.

## 2022-04-30 ENCOUNTER — Ambulatory Visit: Payer: Medicare Other | Attending: Internal Medicine

## 2022-04-30 DIAGNOSIS — I255 Ischemic cardiomyopathy: Secondary | ICD-10-CM

## 2022-04-30 LAB — CUP PACEART REMOTE DEVICE CHECK
Battery Remaining Longevity: 89 mo
Battery Remaining Percentage: 95.5 %
Battery Voltage: 3.01 V
Date Time Interrogation Session: 20240119035431
Implantable Lead Connection Status: 753985
Implantable Lead Connection Status: 753985
Implantable Lead Connection Status: 753985
Implantable Lead Implant Date: 20141024
Implantable Lead Implant Date: 20141024
Implantable Lead Implant Date: 20141024
Implantable Lead Location: 753858
Implantable Lead Location: 753859
Implantable Lead Location: 753860
Implantable Pulse Generator Implant Date: 20230719
Lead Channel Impedance Value: 310 Ohm
Lead Channel Impedance Value: 780 Ohm
Lead Channel Pacing Threshold Amplitude: 1 V
Lead Channel Pacing Threshold Amplitude: 1.375 V
Lead Channel Pacing Threshold Pulse Width: 0.4 ms
Lead Channel Pacing Threshold Pulse Width: 0.5 ms
Lead Channel Sensing Intrinsic Amplitude: 11.4 mV
Lead Channel Setting Pacing Amplitude: 2.375
Lead Channel Setting Pacing Amplitude: 2.5 V
Lead Channel Setting Pacing Pulse Width: 0.4 ms
Lead Channel Setting Pacing Pulse Width: 0.5 ms
Lead Channel Setting Sensing Sensitivity: 5 mV
Pulse Gen Model: 3562
Pulse Gen Serial Number: 8099635

## 2022-05-03 ENCOUNTER — Non-Acute Institutional Stay: Payer: Medicare Other | Admitting: Family Medicine

## 2022-05-03 VITALS — HR 69 | Temp 97.8°F

## 2022-05-03 DIAGNOSIS — F03918 Unspecified dementia, unspecified severity, with other behavioral disturbance: Secondary | ICD-10-CM

## 2022-05-03 DIAGNOSIS — Z8616 Personal history of COVID-19: Secondary | ICD-10-CM

## 2022-05-03 DIAGNOSIS — D696 Thrombocytopenia, unspecified: Secondary | ICD-10-CM

## 2022-05-03 NOTE — Progress Notes (Signed)
[facility] Consult Note Telephone: [phone]  Fax: [phone]    Date of encounter: 05/03/22 5:59 PM PATIENT NAME: [name] [name] [place] [address]   [phone] (home)  DOB: April 23, 1931 MRN: [id number] PRIMARY CARE PROVIDER:    System, Provider Not In,  No address on file None  REFERRING PROVIDER:   No referring provider defined for this encounter. N/A  RESPONSIBLE PARTY:    Contact Information     Name Relation Home Work [place] Daughter [phone]   Tortora,Chrysten Woulfe Daughter   [phone]   Rutherford Guys   [phone]   Groce,Christopher    [phone]        I met face to face with patient in [place]. Palliative Care was asked to follow this patient by consultation request of  No ref. provider found to address advance care planning and complex medical decision making. This is a follow up visit   ASSESSMENT , SYMPTOM MANAGEMENT AND PLAN / RECOMMENDATIONS:  Dementia with behavioral disturbance FAST 7 score 6e Mood appears stable at present time Has MOST with established goals of care  Thrombocytopenia Review of records with longstanding thrombocytopenia with slight decline possibly secondary to recent viral illness. Question etiology and whether possible effect of Depakote or possibly impacted by Depakote use. Monitor for bleeding given use of Eliquis.   Hx of Covid 19 on 04/16/22 Resolved infection   Advance Care Planning/Goals of Care: Goals include to maximize quality of life and symptom management.  CODE STATUS: MOST as of 09/09/20: DNR/DNI with limited additional intervention Use of antibiotics and IV fluids if indicated No feeding tube.      Follow up Palliative Care Visit: Palliative care will continue to follow for complex medical decision making, advance care planning, and clarification of  goals. Return 4 weeks or prn.    This visit was coded based on medical decision making (MDM).  PPS: 40%  HOSPICE ELIGIBILITY/DIAGNOSIS: TBD  Chief Complaint:   HISTORY OF PRESENT ILLNESS:  [name] [name] is a 89 y.o. year old female with dementia with behavioral disturbance, chronic combined systolic and diastolic CHF with reduced EF, paroxysmal atrial fibrillation, asthma,  polymorphic v tach and complete AV block s/p St Jude pacemaker, left renal mass, hx of uterine cancer and melanoma, depression, hypokalemia and hypomagnesemia and seizure like activity.   Pt talking non-sensically.  Pt is not appearing in pain but does have slight frown and stooped posture when ambulatory.  She is unable to give any information and is a poor historian. Staff indicates she is incontinent of bowel and bladder, feeds herself doing better with more finger foods and requires assistance bathing and dressing.  She has had no recent falls but on 04/16/22 was diagnosed with Covid from home test at facility. CXR at the time showed mild pulmonary edema.  History obtained from review of EMR, discussion with primary team, and interview with family, facility staff/caregiver and/or [name].      Latest Ref Rng & Units 04/16/2022    5:12 PM 10/21/2021   11:19 AM 09/28/2021    2:57 AM  CBC  WBC 4.0 - 10.5 K/uL 8.7  6.5  8.0   Hemoglobin 12.0 - 15.0 g/dL 11.6  13.0  14.3   Hematocrit 36.0 - 46.0 % 36.5  40.1  43.4   Platelets 150 - 400 K/uL 98  114  168  Latest Ref Rng & Units 04/16/2022    5:12 PM 10/21/2021   11:19 AM 09/29/2021    8:04 AM  CMP  Glucose 70 - 99 mg/dL 117  87  104   BUN 8 - 23 mg/dL 22  17  35   Creatinine 0.44 - 1.00 mg/dL 0.98  1.04  1.16   Sodium 135 - 145 mmol/L 139  146  137   Potassium 3.5 - 5.1 mmol/L 4.5  4.4  4.8   Chloride 98 - 111 mmol/L 101  106  104   CO2 22 - 32 mmol/L '28  27  24   '$ Calcium 8.9 - 10.3 mg/dL 8.6  9.3  9.2   Total Protein 6.5 - 8.1 g/dL 6.1     Total Bilirubin  0.3 - 1.2 mg/dL 1.3     Alkaline Phos 38 - 126 U/L 57     AST 15 - 41 U/L 21     ALT 0 - 44 U/L 14       15/24 CXR FINDINGS: Intact left chest wall triple lead pacemaker. Stable cardiomediastinal contours status post median sternotomy. Mild basilar predominant interstitial pulmonary opacities. No large pleural effusion pneumothorax. Visualized upper abdomen is unremarkable. No acute osseous abnormality.   IMPRESSION: Mild pulmonary edema.  I reviewed EMR for available labs, medications, imaging, studies and related documents.  Records reviewed and summarized above.   ROS She is unable to provide any reliable hx    Physical Exam: Current and past weights: weight 145 lbs as of 10/28/21 with no recent weight available Constitutional: NAD General: thin/WD ENMT: intact hearing CV: S1S2, RRR, no LE edema Pulmonary: CTAB, no increased work of breathing, no cough, room air Abdomen:  normo-active BS + 4 quadrants, soft and non tender, no ascites GU: deferred MSK: no sarcopenia, moves all extremities, ambulatory with stooped posture Skin: warm and dry, no rashes or wounds on visible skin Neuro:  no generalized weakness,  noted severe cognitive impairment Psych: non-anxious affect, A and O to self Hem/lymph/immuno: no widespread bruising   Thank you for the opportunity to participate in the care of [name].  The palliative care team will continue to follow. Please call our office at [phone] if we can be of additional assistance.   Marijo Conception, FNP -C  COVID-19 PATIENT SCREENING TOOL Asked and negative response unless otherwise noted:   Have you had symptoms of covid, tested positive or been in contact with someone with symptoms/positive test in the past 5-10 days?  Unknown

## 2022-05-04 ENCOUNTER — Encounter: Payer: Self-pay | Admitting: Family Medicine

## 2022-05-04 DIAGNOSIS — D696 Thrombocytopenia, unspecified: Secondary | ICD-10-CM | POA: Insufficient documentation

## 2022-05-04 DIAGNOSIS — Z8616 Personal history of COVID-19: Secondary | ICD-10-CM | POA: Insufficient documentation

## 2022-05-17 NOTE — Progress Notes (Signed)
Remote pacemaker transmission.   

## 2022-06-02 ENCOUNTER — Non-Acute Institutional Stay: Payer: Medicare Other | Admitting: Family Medicine

## 2022-06-02 ENCOUNTER — Encounter: Payer: Self-pay | Admitting: Family Medicine

## 2022-06-02 VITALS — BP 118/76 | HR 71 | Temp 97.6°F | Resp 18

## 2022-06-02 DIAGNOSIS — F03918 Unspecified dementia, unspecified severity, with other behavioral disturbance: Secondary | ICD-10-CM

## 2022-06-02 DIAGNOSIS — Z8616 Personal history of COVID-19: Secondary | ICD-10-CM

## 2022-06-02 NOTE — Progress Notes (Signed)
[facility] Consult Note Telephone: [phone]  Fax: [phone]    Date of encounter: 06/02/22 2:45 pm PATIENT NAME: [name] [name] [place] [address]   [phone] (home)  DOB: 05-24-1931 MRN: [id number] PRIMARY CARE PROVIDER:    System, Provider Not In,  No address on file None  REFERRING PROVIDER:   No referring provider defined for this encounter. N/A  RESPONSIBLE PARTY:    Contact Information     Name Relation Home Work [place] Daughter [phone]   Vinluan,Mac Dowdell Daughter   [phone]   Rutherford Guys   [phone]   Groce,Christopher    [phone]        I met face to face with patient in [facility]. Palliative Care was asked to follow this patient by consultation request of  No ref. provider found to address advance care planning and complex medical decision making. This is a follow up visit   ASSESSMENT , SYMPTOM MANAGEMENT AND PLAN / RECOMMENDATIONS:  Dementia with behavioral disturbance FAST 7 score 6e Mood appears stable at present time Reported recent cognitive change appears more stable at present time and was likely an acute delirium imposed on dementia from pt having Covid 19. Recommend continuing Namenda, Depakote sprinkles and monitoring of CBC given thrombocytopenia.  2.   Hx of Covid 19 on 04/16/22 Question of increased post viral fatigue and was treated with Lagevrio antiviral therapy Encourage increased rest   Advance Care Planning/Goals of Care: Goals include to maximize quality of life and symptom management.  CODE STATUS: MOST as of 09/09/20: DNR/DNI with limited additional intervention Use of antibiotics and IV fluids if indicated No feeding tube.      Follow up Palliative Care Visit: Palliative care will continue to follow for complex medical decision making, advance care  planning, and clarification of goals. Return 4 weeks or prn.    This visit was coded based on medical decision making (MDM).  PPS: 40%  HOSPICE ELIGIBILITY/DIAGNOSIS: TBD  Chief Complaint:  Pt c/o "I'm tired."  Received request to see patient from colleague indicating pt was having cognitive changes.  HISTORY OF PRESENT ILLNESS:  [name] [name] is a 89 y.o. year old female with dementia with behavioral disturbance, chronic combined systolic and diastolic CHF with reduced EF, paroxysmal atrial fibrillation, asthma,  polymorphic v tach and complete AV block s/p St Jude pacemaker, left renal mass, hx of uterine cancer and melanoma, depression, hypokalemia and hypomagnesemia and seizure like activity.   Pt states "I'm tired. I stay very busy." She is seen sitting in a wc at the nurses station and when not being stimulated falls asleep.  She is unable to give any reliable history. Staff indicates she is incontinent of bowel and bladder, feeds herself doing better with more finger foods and requires assistance bathing and dressing.  She has had no recent falls.  On 04/16/22 was diagnosed with Covid at facility, sent to ED and treated with Lagevrio. Staff indicates she has significantly improved in her capabilities in the last week to 10 days and appetite has improved.  History obtained from review of EMR, discussion with facility staff and/or [name] [name].       I reviewed EMR for available labs, medications, imaging, studies and related documents.  Records reviewed and summarized above.   ROS She is unable to provide any reliable hx    Physical Exam: Current and past weights: weight  145 lbs as of 10/28/21 with no recent weight available Constitutional: NAD General: thin/WD ENMT: mildly hard of hearing CV: S1S2, RRR, no LE edema Pulmonary: CTAB, no increased work of breathing, no cough, room air Abdomen:  hypo-active BS + 4 quadrants, soft and non tender GU: deferred MSK: no sarcopenia, moves  all extremities, ambulatory with stooped posture Skin: warm and dry, no rashes or wounds on visible skin Neuro:  no generalized weakness,  noted severe cognitive impairment Psych: non-anxious affect, A and O to self Hem/lymph/immuno: no widespread bruising   Thank you for the opportunity to participate in the care of [name] [name].  The palliative care team will continue to follow. Please call our office at [phone] if we can be of additional assistance.   Marijo Conception, FNP -C  COVID-19 PATIENT SCREENING TOOL Asked and negative response unless otherwise noted:   Have you had symptoms of covid, tested positive or been in contact with someone with symptoms/positive test in the past 5-10 days?  Covid + 04/16/22 and treated with antiviral therapy

## 2022-06-24 ENCOUNTER — Non-Acute Institutional Stay: Payer: Medicare Other | Admitting: Family Medicine

## 2022-06-24 DIAGNOSIS — F03918 Unspecified dementia, unspecified severity, with other behavioral disturbance: Secondary | ICD-10-CM

## 2022-06-24 NOTE — Progress Notes (Signed)
[facility] Consult Note Telephone: [phone]  Fax: [phone]    Date of encounter: 06/02/22 2:45 pm PATIENT NAME: [name] [name] [id number]   [phone] (home)  DOB: 06-16-1931 MRN: [id number] PRIMARY CARE PROVIDER:    System, Provider Not In,  No address on file None  REFERRING PROVIDER:   No referring provider defined for this encounter. N/A  RESPONSIBLE PARTY:    Contact Information     Name Relation Home Work [place] Daughter [phone]   Wynder,Shamona Wirtz Daughter   [phone]   Rutherford Guys   [phone]   Groce,Christopher    [phone]        I met face to face with patient in [facility]. Palliative Care was asked to follow this patient by consultation request of  No ref. provider found to address advance care planning and complex medical decision making. This is a follow up visit   ASSESSMENT , SYMPTOM MANAGEMENT AND PLAN / RECOMMENDATIONS:  Dementia with behavioral disturbance FAST 7 score 6e Mood appears stable at present time Recommend continuing Namenda, Depakote sprinkles.   Advance Care Planning/Goals of Care:  CODE STATUS: MOST as of 09/09/20: DNR/DNI with limited additional intervention Use of antibiotics and IV fluids if indicated No feeding tube.    Follow up Palliative Care Visit: Palliative care will continue to follow for complex medical decision making, advance care planning, and clarification of goals. Return 4 weeks or prn.    This visit was coded based on medical decision making (MDM).  PPS: 40%  HOSPICE ELIGIBILITY/DIAGNOSIS: TBD  Chief Complaint:  Palliative Care is continuing to follow patient for chronic medical management in setting of dementia.  HISTORY OF PRESENT ILLNESS:  [name] [name] is a 89 y.o. year old female with dementia with behavioral  disturbance, chronic combined systolic and diastolic CHF with reduced EF, paroxysmal atrial fibrillation, asthma,  polymorphic v tach and complete AV block s/p St Jude pacemaker, left renal mass, hx of uterine cancer and melanoma, depression, hypokalemia and hypomagnesemia and seizure like activity.   She is unable to give any reliable history.  She is seen sitting down to dinner and has no c/o's.  History obtained from review of EMR, discussion with facility staff and/or [name] [name].       I reviewed EMR for available labs, medications, imaging, studies and related documents.  There are no new records available.   ROS She is unable to provide any reliable hx    Physical Exam: Current and past weights: weight 145 lbs as of 10/28/21 with no recent weight available Constitutional: NAD General: thin/WD ENMT:  hard of hearing CV: S1S2, RRR, no LE edema Pulmonary: CTAB, no increased work of breathing, no cough, room air Abdomen: soft and non tender GU: deferred MSK: no sarcopenia, moves all extremities, mobility with wc Skin: warm and dry, dry flaky skin on BLE Neuro:  Generalized BLE weakness, noted severe cognitive impairment Psych: non-anxious affect, A and O to self Hem/lymph/immuno: no widespread bruising   Thank you for the opportunity to participate in the care of [name] [name].  The palliative care team will continue to follow. Please call our office at [phone] if we can be of additional assistance.   Marijo Conception, FNP -C  COVID-19 PATIENT SCREENING TOOL Asked and negative response unless otherwise noted:   Have you had symptoms of covid, tested positive or been in  contact with someone with symptoms/positive test in the past 5-10 days?  Unknown

## 2022-06-28 ENCOUNTER — Encounter: Payer: Self-pay | Admitting: Family Medicine

## 2022-07-28 ENCOUNTER — Non-Acute Institutional Stay: Payer: Medicare Other | Admitting: Family Medicine

## 2022-07-28 VITALS — HR 81 | Temp 97.7°F | Resp 16

## 2022-07-28 DIAGNOSIS — F03918 Unspecified dementia, unspecified severity, with other behavioral disturbance: Secondary | ICD-10-CM

## 2022-07-29 ENCOUNTER — Encounter: Payer: Self-pay | Admitting: Family Medicine

## 2022-07-29 NOTE — Progress Notes (Signed)
Therapist, nutritional Palliative Care Consult Note Telephone: [phone]  Fax: [phone]   Date of encounter: 07/29/22 6:24 AM PATIENT NAME: [name] [name] [address] [place] [address]   [phone] (home)  DOB: 1931-04-13 MRN: [id number] PRIMARY CARE PROVIDER:    System, Provider Not In,  No address on file None  REFERRING PROVIDER:   No referring provider defined for this encounter. N/A  Emergency Contact:    Contact Information     Name Relation Home Work [place] Daughter [phone]   Finken,Ashdon Gillson Daughter   [phone]   Evlyn Courier   [phone]   Groce,Christopher    [phone]       Health Care POA: Letta Pate   I met face to face with patient in [facility] facility. Palliative Care was asked to follow this patient by consultation request of No ref. provider found to address advance care planning and complex medical decision making. This is a follow up visit.   CODE STATUS: MOST as of 09/09/20: DNR/DNI with limited additional intervention Use of antibiotics and IV fluids if indicated No feeding tube.     ASSESSMENT AND / RECOMMENDATIONS:  PPS: 40% Dementia with behavioral disturbance FAST 7 score 6e Mood stable without agitation Continue Namenda, Depakote sprinkles and lexapro.    Follow up Palliative Care Visit:  Palliative Care continuing to follow up by monitoring for changes in appetite, weight, functional and cognitive status for chronic disease progression and management in agreement with patient's stated goals of care. Next visit in 4 weeks or prn.  This visit was coded based on medical decision making (MDM).  Chief Complaint  Palliative Care continues to follow pt for chronic medical management in setting of dementia.  HISTORY OF PRESENT ILLNESS: [name] [name] is a 89 y.o. year old female with dementia and CHF. Pt  denies CP, SOB but is not a reliable historian.  Facility staff indicates no significant changes in function, falls or concerns.    ACTIVITIES OF DAILY LIVING: Incontinent of bowel/bladder BATHING/DRESSING/FEEDING: Requires standby assist with bathing and dressing, independent with feeding  MOBILITY:   INDEPENDENTLY AMBULATORY/AMBULATORY WITH ASSISTIVE DEVICE: Walker   APPETITE? Good WEIGHT: No recent weight, no noted atrophy  CURRENT PROBLEM LIST:  Patient Active Problem List   Diagnosis Date Noted   Thrombocytopenia 05/04/2022   History of COVID-19 05/04/2022   Fall 11/13/2021   Seizure-like activity 09/23/2021   Asymptomatic bacteriuria 09/23/2021   DNR (do not resuscitate) 09/21/2021   Polymorphic ventricular tachycardia 09/21/2021   Chronic obstructive pulmonary disease 03/20/2019   Major depressive disorder with single episode, in partial remission 03/20/2019   Arthritis of carpometacarpal (CMC) joint of left thumb 06/02/2017   Current use of long term anticoagulation 06/02/2017   Left renal mass 01/27/2017   Overactive bladder 07/09/2016   Dizziness 05/07/2016   Microscopic hematuria 09/02/2015   CAD (coronary artery disease) 04/17/2014   Allergic rhinitis 04/17/2014   Dementia with behavioral disturbance (HCC) 04/17/2014   Hearing loss 04/17/2014   Pacemaker -CRT-St. Jude 05/21/2013   Atrioventricular block, complete 03/08/2013   Paroxysmal atrial fibrillation 02/03/2013   Asthma, intrinsic 02/03/2013   Chronic combined systolic and diastolic congestive heart failure 10/10/2012   Depression 09/19/2012   Hypokalemia 07/14/2012   Syncope 07/11/2012   Varicose veins 09/29/2009   History of melanoma 09/06/2008   LBBB (left bundle branch block) 09/06/2008   History of uterine cancer 09/06/2008   Hyperlipidemia 10/06/2007  Essential hypertension 10/06/2007   HFrEF (heart failure with reduced ejection fraction) 10/06/2007   PAST MEDICAL HISTORY:  Active  Ambulatory Problems    Diagnosis Date Noted   History of melanoma 09/06/2008   Hyperlipidemia 10/06/2007   Essential hypertension 10/06/2007   HFrEF (heart failure with reduced ejection fraction) 10/06/2007   LBBB (left bundle branch block) 09/06/2008   Varicose veins 09/29/2009   History of uterine cancer 09/06/2008   Syncope 07/11/2012   Hypokalemia 07/14/2012   Depression 09/19/2012   Chronic combined systolic and diastolic congestive heart failure 10/10/2012   Paroxysmal atrial fibrillation 02/03/2013   Asthma, intrinsic 02/03/2013   Atrioventricular block, complete 03/08/2013   Pacemaker -CRT-St. Jude 05/21/2013   CAD (coronary artery disease) 04/17/2014   Allergic rhinitis 04/17/2014   Dementia with behavioral disturbance (HCC) 04/17/2014   Hearing loss 04/17/2014   Microscopic hematuria 09/02/2015   Dizziness 05/07/2016   Overactive bladder 07/09/2016   Left renal mass 01/27/2017   Arthritis of carpometacarpal (CMC) joint of left thumb 06/02/2017   Current use of long term anticoagulation 06/02/2017   Chronic obstructive pulmonary disease 03/20/2019   Major depressive disorder with single episode, in partial remission 03/20/2019   DNR (do not resuscitate) 09/21/2021   Polymorphic ventricular tachycardia 09/21/2021   Seizure-like activity 09/23/2021   Asymptomatic bacteriuria 09/23/2021   Fall 11/13/2021   Thrombocytopenia 05/04/2022   History of COVID-19 05/04/2022   Resolved Ambulatory Problems    Diagnosis Date Noted   MYOCARDIAL INFARCTION, HX OF 10/06/2007   LACERATION 01/26/2010   History of colonic polyps 10/30/2009   TIA (transient ischemic attack) 07/11/2012   Orthostatic hypotension 10/19/2012   Mobitz type 2 second degree AV block 02/01/2013   Traumatic perinephric hematoma of left kidney 03/29/2017   Acute blood loss as cause of postoperative anemia 03/29/2017   HCAP (healthcare-associated pneumonia) 04/04/2017   Acute encephalopathy 04/04/2017    Fracture of femoral neck, right 09/01/2020   Closed hip fracture, right, initial encounter 09/01/2020   Closed fracture of right femur, unspecified fracture morphology, initial encounter 09/02/2020   Fall at nursing home, initial encounter 09/21/2021   Laceration of left upper arm 09/23/2021   Hypomagnesemia 09/23/2021   Nausea & vomiting 09/27/2021   AKI (acute kidney injury) 09/28/2021   Past Medical History:  Diagnosis Date   Atrial fibrillation 10/06/2007   COPD (chronic obstructive pulmonary disease)    CORONARY ARTERY DISEASE 10/06/2007   HYPERLIPIDEMIA 10/06/2007   HYPERTENSION 10/06/2007   LBBB 09/06/2008   MELANOMA 09/06/2008   NICM (nonischemic cardiomyopathy)    PERSONAL HX COLONIC POLYPS 10/30/2009   Presence of permanent cardiac pacemaker    UTERINE CANCER, HX OF 09/06/2008   VARICOSE VEIN 09/29/2009   SOCIAL HX:  Social History   Tobacco Use   Smoking status: Never   Smokeless tobacco: Never  Substance Use Topics   Alcohol use: Yes    Comment:  OCC gin or wine   FAMILY HX:  Family History  Problem Relation Age of Onset   Stroke Mother    Heart attack Father    Colon cancer Neg Hx      Preferred Pharmacy: ALLERGIES:  Allergies  Allergen Reactions   Aricept [Donepezil] Shortness Of Breath and Other (See Comments)    Hallucinations Loss of sensation in legs   Nitrostat [Nitroglycerin] Other (See Comments)    Rapid drop in blood pressure with use of oral spray/SL tablet formulations     PERTINENT MEDICATIONS:  Outpatient Encounter Medications as of  07/28/2022  Medication Sig   acetaminophen (TYLENOL) 500 MG tablet Take 1,000 mg by mouth in the morning and at bedtime. (0800, 2000)   Cholecalciferol (VITAMIN D3 SUPER STRENGTH) 50 MCG (2000 UT) TABS Take 2,000 Units by mouth in the morning. (0800)   divalproex (DEPAKOTE SPRINKLE) 125 MG capsule Take 125 mg by mouth 2 (two) times daily. (0800, 2000)   guaiFENesin 200 MG tablet Take 200-400 mg by mouth  every 4 (four) hours as needed (congestion).   Magnesium Oxide -Mg Supplement (MAG-OXIDE) 200 MG TABS Take 1 tablet (200 mg total) by mouth daily. (2000) (Patient taking differently: Take 200 mg by mouth every other day. (2000))   Melatonin 10 MG TABS Take 10 mg by mouth at bedtime. (2000)   memantine (NAMENDA) 5 MG tablet Take 5 mg by mouth in the morning and at bedtime. (0800, 2000)   pyridostigmine (MESTINON) 60 MG tablet TAKE 1 TABLET(60 MG) BY MOUTH DAILY (Patient taking differently: Take 60 mg by mouth in the morning. (0800))   No facility-administered encounter medications on file as of 07/28/2022.    History obtained from review of EMR, discussion with facility staff/caregiver and/or patient.   CBC    Component Value Date/Time   WBC 8.7 04/16/2022 1712   RBC 3.75 (L) 04/16/2022 1712   HGB 11.6 (L) 04/16/2022 1712   HGB 13.0 10/21/2021 1119   HCT 36.5 04/16/2022 1712   HCT 40.1 10/21/2021 1119   PLT 98 (L) 04/16/2022 1712   PLT 114 (L) 10/21/2021 1119   MCV 97.3 04/16/2022 1712   MCV 94 10/21/2021 1119   MCH 30.9 04/16/2022 1712   MCHC 31.8 04/16/2022 1712   RDW 14.0 04/16/2022 1712   RDW 13.4 10/21/2021 1119   LYMPHSABS 0.8 04/16/2022 1712   MONOABS 1.0 04/16/2022 1712   EOSABS 0.0 04/16/2022 1712   BASOSABS 0.0 04/16/2022 1712    CMP    Latest Ref Rng & Units 04/16/2022    5:12 PM 10/21/2021   11:19 AM 09/29/2021    8:04 AM  CMP  Glucose 70 - 99 mg/dL 696  87  295   BUN 8 - 23 mg/dL 22  17  35   Creatinine 0.44 - 1.00 mg/dL 2.84  1.32  4.40   Sodium 135 - 145 mmol/L 139  146  137   Potassium 3.5 - 5.1 mmol/L 4.5  4.4  4.8   Chloride 98 - 111 mmol/L 101  106  104   CO2 22 - 32 mmol/L Calcium 8.9 - 10.3 mg/dL 8.6  9.3  9.2   Total Protein 6.5 - 8.1 g/dL 6.1     Total Bilirubin 0.3 - 1.2 mg/dL 1.3     Alkaline Phos 38 - 126 U/L 57     AST 15 - 41 U/L 21     ALT 0 - 44 U/L 14       LFTs    Latest Ref Rng & Units 04/16/2022    5:12 PM 09/28/2021     2:57 AM 09/27/2021    2:08 AM  Hepatic Function  Total Protein 6.5 - 8.1 g/dL 6.1  5.8  5.8   Albumin 3.5 - 5.0 g/dL 3.3  3.2  3.4   AST 15 - 41 U/L ALT 0 - 44 U/L Alk Phosphatase 38 - 126 U/L 57  61  63   Total  Bilirubin 0.3 - 1.2 mg/dL 1.3  1.1  0.9      I reviewed available labs, medications, imaging, studies and related documents from the EMR.  There were no new records/imaging since last visit. Records reviewed and summarized above.   Physical Exam: GENERAL: NAD EENT:  HOH LUNGS: CTAB, no increased work of breathing, room air CARDIAC:  S1S2, RRR with holosystolic soft murmur, No edema/cyanosis ABD:  Normo-active BS x 4 quads, soft, non-tender EXTREMITIES: Normal ROM, no deformity, strength equal, intermittent unsteady gait and poor standing posture, No muscle atrophy/subcutaneous fat loss NEURO:  No weakness, Noted cognitive impairment, no aphasia-expressive/receptive  PSYCH:  non-anxious affect, A & O x 1  Thank you for the opportunity to participate in the care of Jayne Centeno. Please call our main office at [phone] if we can be of additional assistance.    Joycelyn Man FNP-C  [email or web address] .[facility]  Phone:  [phone]

## 2022-07-30 ENCOUNTER — Ambulatory Visit (INDEPENDENT_AMBULATORY_CARE_PROVIDER_SITE_OTHER): Payer: Medicare Other

## 2022-07-30 DIAGNOSIS — I255 Ischemic cardiomyopathy: Secondary | ICD-10-CM

## 2022-07-30 LAB — CUP PACEART REMOTE DEVICE CHECK
Battery Remaining Longevity: 88 mo
Battery Remaining Percentage: 94 %
Battery Voltage: 3.01 V
Date Time Interrogation Session: 20240419020012
Implantable Lead Connection Status: 753985
Implantable Lead Connection Status: 753985
Implantable Lead Connection Status: 753985
Implantable Lead Implant Date: 20141024
Implantable Lead Implant Date: 20141024
Implantable Lead Implant Date: 20141024
Implantable Lead Location: 753858
Implantable Lead Location: 753859
Implantable Lead Location: 753860
Implantable Pulse Generator Implant Date: 20230719
Lead Channel Impedance Value: 330 Ohm
Lead Channel Impedance Value: 760 Ohm
Lead Channel Pacing Threshold Amplitude: 1 V
Lead Channel Pacing Threshold Amplitude: 1.125 V
Lead Channel Pacing Threshold Pulse Width: 0.4 ms
Lead Channel Pacing Threshold Pulse Width: 0.5 ms
Lead Channel Sensing Intrinsic Amplitude: 12 mV
Lead Channel Setting Pacing Amplitude: 2.125
Lead Channel Setting Pacing Amplitude: 2.5 V
Lead Channel Setting Pacing Pulse Width: 0.4 ms
Lead Channel Setting Pacing Pulse Width: 0.5 ms
Lead Channel Setting Sensing Sensitivity: 5 mV
Pulse Gen Model: 3562
Pulse Gen Serial Number: 8099635

## 2022-08-27 NOTE — Progress Notes (Signed)
Remote pacemaker transmission.   

## 2022-10-29 ENCOUNTER — Ambulatory Visit (INDEPENDENT_AMBULATORY_CARE_PROVIDER_SITE_OTHER): Payer: Medicare Other

## 2022-10-29 DIAGNOSIS — I255 Ischemic cardiomyopathy: Secondary | ICD-10-CM

## 2022-10-31 LAB — CUP PACEART REMOTE DEVICE CHECK
Battery Remaining Longevity: 85 mo
Battery Remaining Percentage: 91 %
Battery Voltage: 2.99 V
Date Time Interrogation Session: 20240719020010
Implantable Lead Connection Status: 753985
Implantable Lead Connection Status: 753985
Implantable Lead Connection Status: 753985
Implantable Lead Implant Date: 20141024
Implantable Lead Implant Date: 20141024
Implantable Lead Implant Date: 20141024
Implantable Lead Location: 753858
Implantable Lead Location: 753859
Implantable Lead Location: 753860
Implantable Pulse Generator Implant Date: 20230719
Lead Channel Impedance Value: 330 Ohm
Lead Channel Impedance Value: 750 Ohm
Lead Channel Pacing Threshold Amplitude: 1 V
Lead Channel Pacing Threshold Amplitude: 1.125 V
Lead Channel Pacing Threshold Pulse Width: 0.4 ms
Lead Channel Pacing Threshold Pulse Width: 0.5 ms
Lead Channel Sensing Intrinsic Amplitude: 11 mV
Lead Channel Setting Pacing Amplitude: 2.125
Lead Channel Setting Pacing Amplitude: 2.5 V
Lead Channel Setting Pacing Pulse Width: 0.4 ms
Lead Channel Setting Pacing Pulse Width: 0.5 ms
Lead Channel Setting Sensing Sensitivity: 5 mV
Pulse Gen Model: 3562
Pulse Gen Serial Number: 8099635

## 2022-11-11 NOTE — Progress Notes (Signed)
Remote pacemaker transmission.   

## 2023-01-28 ENCOUNTER — Ambulatory Visit (INDEPENDENT_AMBULATORY_CARE_PROVIDER_SITE_OTHER): Payer: Medicare Other

## 2023-01-28 DIAGNOSIS — I255 Ischemic cardiomyopathy: Secondary | ICD-10-CM | POA: Diagnosis not present

## 2023-01-28 LAB — CUP PACEART REMOTE DEVICE CHECK
Battery Remaining Longevity: 81 mo
Battery Remaining Percentage: 88 %
Battery Voltage: 2.99 V
Date Time Interrogation Session: 20241018020015
Implantable Lead Connection Status: 753985
Implantable Lead Connection Status: 753985
Implantable Lead Connection Status: 753985
Implantable Lead Implant Date: 20141024
Implantable Lead Implant Date: 20141024
Implantable Lead Implant Date: 20141024
Implantable Lead Location: 753858
Implantable Lead Location: 753859
Implantable Lead Location: 753860
Implantable Pulse Generator Implant Date: 20230719
Lead Channel Impedance Value: 330 Ohm
Lead Channel Impedance Value: 700 Ohm
Lead Channel Pacing Threshold Amplitude: 1 V
Lead Channel Pacing Threshold Amplitude: 1.125 V
Lead Channel Pacing Threshold Pulse Width: 0.4 ms
Lead Channel Pacing Threshold Pulse Width: 0.5 ms
Lead Channel Sensing Intrinsic Amplitude: 11 mV
Lead Channel Setting Pacing Amplitude: 2.125
Lead Channel Setting Pacing Amplitude: 2.5 V
Lead Channel Setting Pacing Pulse Width: 0.4 ms
Lead Channel Setting Pacing Pulse Width: 0.5 ms
Lead Channel Setting Sensing Sensitivity: 5 mV
Pulse Gen Model: 3562
Pulse Gen Serial Number: 8099635

## 2023-02-11 NOTE — Progress Notes (Signed)
Remote pacemaker transmission.   

## 2023-04-08 ENCOUNTER — Other Ambulatory Visit: Payer: Self-pay

## 2023-04-08 ENCOUNTER — Encounter (HOSPITAL_COMMUNITY): Payer: Self-pay

## 2023-04-08 ENCOUNTER — Emergency Department (HOSPITAL_COMMUNITY): Payer: Medicare Other

## 2023-04-08 ENCOUNTER — Emergency Department (HOSPITAL_COMMUNITY)
Admission: EM | Admit: 2023-04-08 | Discharge: 2023-04-08 | Disposition: A | Discharge status: Home / Self Care | Payer: Medicare Other | Attending: Emergency Medicine | Admitting: Emergency Medicine

## 2023-04-08 DIAGNOSIS — F0390 Unspecified dementia without behavioral disturbance: Secondary | ICD-10-CM | POA: Diagnosis not present

## 2023-04-08 DIAGNOSIS — S6992XA Unspecified injury of left wrist, hand and finger(s), initial encounter: Secondary | ICD-10-CM

## 2023-04-08 DIAGNOSIS — Z7901 Long term (current) use of anticoagulants: Secondary | ICD-10-CM | POA: Insufficient documentation

## 2023-04-08 DIAGNOSIS — W19XXXA Unspecified fall, initial encounter: Secondary | ICD-10-CM

## 2023-04-08 DIAGNOSIS — R42 Dizziness and giddiness: Secondary | ICD-10-CM | POA: Insufficient documentation

## 2023-04-08 DIAGNOSIS — D696 Thrombocytopenia, unspecified: Secondary | ICD-10-CM | POA: Diagnosis not present

## 2023-04-08 LAB — CBC WITH DIFFERENTIAL/PLATELET
Abs Immature Granulocytes: 0.02 10*3/uL (ref 0.00–0.07)
Basophils Absolute: 0 10*3/uL (ref 0.0–0.1)
Basophils Relative: 0 %
Eosinophils Absolute: 0.2 10*3/uL (ref 0.0–0.5)
Eosinophils Relative: 3 %
HCT: 42 % (ref 36.0–46.0)
Hemoglobin: 13.5 g/dL (ref 12.0–15.0)
Immature Granulocytes: 0 %
Lymphocytes Relative: 26 %
Lymphs Abs: 1.5 10*3/uL (ref 0.7–4.0)
MCH: 31.2 pg (ref 26.0–34.0)
MCHC: 32.1 g/dL (ref 30.0–36.0)
MCV: 97 fL (ref 80.0–100.0)
Monocytes Absolute: 0.4 10*3/uL (ref 0.1–1.0)
Monocytes Relative: 7 %
Neutro Abs: 3.7 10*3/uL (ref 1.7–7.7)
Neutrophils Relative %: 64 %
Platelets: 114 10*3/uL — ABNORMAL LOW (ref 150–400)
RBC: 4.33 MIL/uL (ref 3.87–5.11)
RDW: 14.3 % (ref 11.5–15.5)
WBC: 5.8 10*3/uL (ref 4.0–10.5)
nRBC: 0 % (ref 0.0–0.2)

## 2023-04-08 LAB — I-STAT CHEM 8, ED
BUN: 23 mg/dL (ref 8–23)
Calcium, Ion: 1.18 mmol/L (ref 1.15–1.40)
Chloride: 107 mmol/L (ref 98–111)
Creatinine, Ser: 1.1 mg/dL — ABNORMAL HIGH (ref 0.44–1.00)
Glucose, Bld: 100 mg/dL — ABNORMAL HIGH (ref 70–99)
HCT: 44 % (ref 36.0–46.0)
Hemoglobin: 15 g/dL (ref 12.0–15.0)
Potassium: 4.3 mmol/L (ref 3.5–5.1)
Sodium: 142 mmol/L (ref 135–145)
TCO2: 25 mmol/L (ref 22–32)

## 2023-04-08 LAB — VALPROIC ACID LEVEL: Valproic Acid Lvl: 30 ug/mL — ABNORMAL LOW (ref 50.0–100.0)

## 2023-04-08 LAB — BASIC METABOLIC PANEL
Anion gap: 10 (ref 5–15)
BUN: 19 mg/dL (ref 8–23)
CO2: 24 mmol/L (ref 22–32)
Calcium: 9.5 mg/dL (ref 8.9–10.3)
Chloride: 107 mmol/L (ref 98–111)
Creatinine, Ser: 1 mg/dL (ref 0.44–1.00)
GFR, Estimated: 54 mL/min — ABNORMAL LOW (ref >60)
Glucose, Bld: 104 mg/dL — ABNORMAL HIGH (ref 70–99)
Potassium: 4.1 mmol/L (ref 3.5–5.1)
Sodium: 141 mmol/L (ref 135–145)

## 2023-04-08 LAB — APTT: aPTT: 24 s (ref 24–36)

## 2023-04-08 LAB — I-STAT CG4 LACTIC ACID, ED: Lactic Acid, Venous: 0.9 mmol/L (ref 0.5–1.9)

## 2023-04-08 NOTE — ED Notes (Signed)
I attempted to give report to [place] at [place] and Abby stated she couldn't get a hold of anyone.

## 2023-04-08 NOTE — Discharge Instructions (Signed)
You were seen for your fall in the emergency department.  He did not have any life-threatening injuries or broken bones.  At home, please continue to take your medication and use Tylenol for your pain.  Please ice your thumb to limit the swelling.    Check your MyChart online for the results of any tests that had not resulted by the time you left the emergency department.   Follow-up with your primary doctor in 2-3 days regarding your visit.    Return immediately to the emergency department if you experience any of the following: Worsening pain, severe headache, vomiting, or any other concerning symptoms.    Thank you for visiting our Emergency Department. It was a pleasure taking care of you today.

## 2023-04-08 NOTE — ED Provider Notes (Signed)
EMERGENCY DEPARTMENT AT [facility] Provider Note   CSN: [id number] Arrival date & time: 04/08/23  1407     History  Chief Complaint  Patient presents with   level 2 fall on thinners    Stela Dunsford is a 89 y.o. female.  HPI Was walking with an aide at [facility].  The patient lost balance and fell straight forward hitting her face.  Reportedly the patient was dazed at first and has seemed less "chipper" than her normal self.  At baseline she has dementia.  Patient does not have any acute complaints but is limited in her verbal interactions.  She makes random comments that are not situationally oriented.  She does not exhibit any distress.  Patient is anticoagulated on Eliquis.    Home Medications Prior to Admission medications   Medication Sig Start Date End Date Taking? Authorizing Provider  acetaminophen (TYLENOL) 500 MG tablet Take 1,000 mg by mouth in the morning and at bedtime. (0800, 2000)    [provider]  apixaban (ELIQUIS) 2.5 MG TABS tablet Take 1 tablet (2.5 mg total) by mouth 2 (two) times daily. Patient taking differently: Take 2.5 mg by mouth 2 (two) times daily. (0800, 2000) 09/29/21 06/19/22  Zigmund Daniel., MD  Cholecalciferol (VITAMIN D3 SUPER STRENGTH) 50 MCG (2000 UT) TABS Take 2,000 Units by mouth in the morning. (0800)    [provider]  divalproex (DEPAKOTE SPRINKLE) 125 MG capsule Take 125 mg by mouth 2 (two) times daily. (0800, 2000)    [provider]  escitalopram (LEXAPRO) 10 MG tablet Take 1 tablet (10 mg total) by mouth daily. Patient taking differently: Take 10 mg by mouth in the morning. (0800) 09/29/21 06/19/22  Zigmund Daniel., MD  guaiFENesin 200 MG tablet Take 200-400 mg by mouth every 4 (four) hours as needed (congestion).    [provider]  Magnesium Oxide -Mg Supplement (MAG-OXIDE) 200 MG TABS Take 1 tablet (200 mg total) by mouth daily. (2000) Patient taking differently:  Take 200 mg by mouth every other day. (2000) 10/23/21   Sheilah Pigeon, PA-C  Melatonin 10 MG TABS Take 10 mg by mouth at bedtime. (2000)    [provider]  memantine (NAMENDA) 5 MG tablet Take 5 mg by mouth in the morning and at bedtime. (0800, 2000) 08/22/20   [provider]  metoprolol succinate (TOPROL-XL) 25 MG 24 hr tablet Take 1 tablet (25 mg total) by mouth daily. Patient taking differently: Take 25 mg by mouth in the morning. (0800) 09/29/21 06/19/22  Zigmund Daniel., MD  OLANZapine (ZYPREXA) 2.5 MG tablet Take 1 tablet (2.5 mg total) by mouth at bedtime. Patient taking differently: Take 2.5 mg by mouth 2 (two) times daily. (0800, 2000) 09/29/21 06/19/22  Zigmund Daniel., MD  pyridostigmine (MESTINON) 60 MG tablet TAKE 1 TABLET(60 MG) BY MOUTH DAILY Patient taking differently: Take 60 mg by mouth in the morning. (0800) 08/06/19   Shelva Majestic, MD  spironolactone (ALDACTONE) 25 MG tablet Take 0.5 tablets (12.5 mg total) by mouth daily. Patient taking differently: Take 12.5 mg by mouth in the morning. (0800) 09/29/21 06/19/22  Zigmund Daniel., MD      Allergies    Aricept [donepezil] and Nitrostat [nitroglycerin]    Review of Systems   Review of Systems  Physical Exam Updated Vital Signs BP (!) 146/87   Pulse 74   Temp (!) 97.5 F (36.4 C) (Axillary)  Resp 17   Ht 5\' 8"  (1.727 m)   Wt 65.8 kg   LMP  (LMP Unknown)   SpO2 100%   BMI 22.06 kg/m  Physical Exam Constitutional:      Comments: Alert and nontoxic.  No respiratory distress.  Well-nourished well-developed.  HENT:     Head: Normocephalic and atraumatic.     Nose: Nose normal.     Mouth/Throat:     Mouth: Mucous membranes are moist.     Pharynx: Oropharynx is clear.  Eyes:     Extraocular Movements: Extraocular movements intact.  Cardiovascular:     Rate and Rhythm: Regular rhythm.  Pulmonary:     Effort: Pulmonary effort is normal.     Breath sounds: Normal breath  sounds.  Chest:     Chest wall: No tenderness.  Abdominal:     General: There is no distension.     Palpations: Abdomen is soft.     Tenderness: There is no abdominal tenderness. There is no guarding.  Musculoskeletal:        General: No swelling, tenderness or deformity. Normal range of motion.     Cervical back: Neck supple.     Right lower leg: No edema.     Left lower leg: No edema.  Skin:    General: Skin is warm and dry.  Neurological:     Comments: Patient is awake.  She is alert in appearance.  She makes occasional comments that are not situationally related.  Is clear.  Consistent with dementia.  No focal motor deficits.  Psychiatric:        Mood and Affect: Mood normal.     ED Results / Procedures / Treatments   Labs (all labs ordered are listed, but only abnormal results are displayed) Labs Reviewed  CBC WITH DIFFERENTIAL/PLATELET - Abnormal; Notable for the following components:      Result Value   Platelets 114 (*)    All other components within normal limits  BASIC METABOLIC PANEL - Abnormal; Notable for the following components:   Glucose, Bld 104 (*)    GFR, Estimated 54 (*)    All other components within normal limits  I-STAT CHEM 8, ED - Abnormal; Notable for the following components:   Creatinine, Ser 1.10 (*)    Glucose, Bld 100 (*)    All other components within normal limits  APTT  VALPROIC ACID LEVEL  I-STAT CG4 LACTIC ACID, ED    EKG None  Radiology CT MAXILLOFACIAL WO CONTRAST Result Date: 04/08/2023 CLINICAL DATA:  Facial trauma.  The patient is on blood thinners. EXAM: CT MAXILLOFACIAL WITHOUT CONTRAST TECHNIQUE: Multidetector CT imaging of the maxillofacial structures was performed. Multiplanar CT image reconstructions were also generated. RADIATION DOSE REDUCTION: This exam was performed according to the departmental dose-optimization program which includes automated exposure control, adjustment of the mA and/or kV according to patient size  and/or use of iterative reconstruction technique. COMPARISON:  CT head without contrast 09/21/2021 FINDINGS: Osseous: Acute or healing fractures are present. The mandible is intact and located. No focal osseous lesions are present. Orbits: Bilateral lens replacements are noted. Globes and orbits are otherwise unremarkable. Sinuses: The paranasal sinuses and mastoid air cells are clear. Soft tissues: Extracranial soft tissues of the face and upper neck are within normal limits. Limited intracranial: Unremarkable. IMPRESSION: 1. No acute or healing fractures. 2. Bilateral lens replacements. Electronically Signed   By: Marin Roberts M.D.   On: 04/08/2023 16:02   CT HEAD WO CONTRAST  Result Date: 04/08/2023 CLINICAL DATA:  Head trauma. Moderate to severe. Fall. Patient is on blood thinners. EXAM: CT HEAD WITHOUT CONTRAST TECHNIQUE: Contiguous axial images were obtained from the base of the skull through the vertex without intravenous contrast. RADIATION DOSE REDUCTION: This exam was performed according to the departmental dose-optimization program which includes automated exposure control, adjustment of the mA and/or kV according to patient size and/or use of iterative reconstruction technique. COMPARISON:  CT head without contrast 09/21/2021. FINDINGS: Brain: Moderate generalized atrophy and white matter disease is stable bilaterally. No acute infarct, hemorrhage, or mass lesion is present. The ventricles are proportionate to the degree of atrophy. No significant extraaxial fluid collection is present. The brainstem and cerebellum are within normal limits. Midline structures are within normal limits. Vascular: Atherosclerotic calcifications are present within the cavernous internal carotid arteries bilaterally. No hyperdense vessel is present. Skull: Calvarium is intact. No focal lytic or blastic lesions are present. No significant extracranial soft tissue lesion is present. Sinuses/Orbits: The paranasal  sinuses and mastoid air cells are clear. Bilateral lens replacements are noted. Globes and orbits are otherwise unremarkable. IMPRESSION: 1. No acute intracranial abnormality or significant interval change. 2. Stable atrophy and white matter disease. This likely reflects the sequela of chronic microvascular ischemia. Electronically Signed   By: Marin Roberts M.D.   On: 04/08/2023 15:57    Procedures Procedures    Medications Ordered in ED Medications - No data to display  ED Course/ Medical Decision Making/ A&P Clinical Course as of 04/08/23 1610  Fri Apr 08, 2023  1552 Assumed care from Dr Donnald Garre. 89 yo F hx of dementia walking with aids who fell. Hit head but no serious external signs of trauma. On eliquis for AF. Unsure last dose. Awaiting CT results and depakote level at this time.  [RP]    Clinical Course User Index [RP] Rondel Baton, MD                                 Medical Decision Making Amount and/or Complexity of Data Reviewed Labs: ordered. Radiology: ordered.   Patient presents outlined with witnessed mechanical fall.  She is anticoagulated on Eliquis.  Patient is alert.  No outward contusions or abrasions visible at this time.  Will proceed with CT head with anticoagulation and with patient being poor historian will also get chest x-ray and pelvis.  Patient otherwise stable in appearance.  BASIC lab work is stable with stable GFR at 54 and otherwise normal metabolic panel CBC normal with normal differential.  Lactic 0.9.  Diagnostic results are pending for chest x-ray and pelvis.  CT scans reviewed by radiology no acute findings for intracranial injury.  At this time I do feel that if remainder of imaging is negative, patient will be stable for return to SNF.  Dr. Eloise Harman to follow-up on remainder of imaging.        Final Clinical Impression(s) / ED Diagnoses Final diagnoses:  Fall, initial encounter    Rx / DC Orders ED Discharge Orders      None         Arby Barrette, MD 04/08/23 1610

## 2023-04-08 NOTE — ED Notes (Signed)
I CALLED [facility] ETA 1 HOUR

## 2023-04-08 NOTE — ED Notes (Signed)
Trauma Response Nurse Documentation   [name] [name] is a 89 y.o. female arriving to [facility] ED via EMS  On Eliquis (apixaban) daily. Trauma was activated as a Level 2 by ED Charge RN based on the following trauma criteria Elderly patients > 65 with head trauma on anti-coagulation (excluding ASA).  Patient cleared for CT by Dr. Donnald Garre. Pt transported to CT with trauma response nurse present to monitor. RN remained with the patient throughout their absence from the department for clinical observation.   GCS 14.  Trauma MD Arrival Time: N/A.  History   Past Medical History:  Diagnosis Date   Acute blood loss as cause of postoperative anemia 03/29/2017   Atrial fibrillation (HCC) 10/06/2007   post op   Closed fracture of right femur, unspecified fracture morphology, initial encounter (HCC) 09/02/2020   Closed hip fracture, right, initial encounter (HCC) 09/01/2020   COPD (chronic obstructive pulmonary disease) (HCC)    Emphysema on 02/03/13 CXR   CORONARY ARTERY DISEASE 10/06/2007   a. s/p PCI to LAD; b. s/p CABG; c. LHC 07/13/12: Proximal LAD stent patent, LIMA-LAD atretic, D1 occluded, proximal circumflex 30%, mid RCA occluded, SVG-D1 normal, SVG-OM2 normal, SVG-distal RCA normal, EF 40% with diffuse HK   Dizziness    Fall at nursing home, initial encounter 09/21/2021   Fracture of femoral neck, right (HCC) 09/01/2020   HCAP (healthcare-associated pneumonia) 04/04/2017   History of colonic polyps 10/30/2009   No polyps in 2011. No repeat.     HYPERLIPIDEMIA 10/06/2007   HYPERTENSION 10/06/2007   LBBB 09/06/2008   Left renal mass    MELANOMA 09/06/2008   MOES PROCEDURE RIGHT   Nausea & vomiting 09/27/2021   NICM (nonischemic cardiomyopathy) (HCC)    Echocardiogram 07/12/12: EF 25-30%, diffuse HK, mild AI, mild MR, mild LAE   PERSONAL HX COLONIC POLYPS 10/30/2009   Presence of permanent cardiac pacemaker    Syncope    Traumatic perinephric hematoma of left kidney 03/29/2017    UTERINE CANCER, HX OF 09/06/2008   VARICOSE VEIN 09/29/2009     Past Surgical History:  Procedure Laterality Date   ABDOMINAL HYSTERECTOMY  1988   BI-VENTRICULAR PACEMAKER INSERTION (CRT-P)  02/02/2013   [employer] Jude, serial no. #[id number]    BIV PACEMAKER GENERATOR CHANGEOUT N/A 10/28/2021   Procedure: BIV PACEMAKER GENERATOR CHANGEOUT;  Surgeon: Duke Salvia, MD;  Location: [facility] INVASIVE CV LAB;  Service: Cardiovascular;  Laterality: N/A;   CARDIAC CATHETERIZATION  08/30/2008   CORONARY ANGIOPLASTY WITH STENT PLACEMENT  2009   CORONARY ARTERY BYPASS GRAFT  2004   IR RADIOLOGIST EVAL & MGMT  02/08/2017   IR RADIOLOGIST EVAL & MGMT  05/26/2017   IR RADIOLOGIST EVAL & MGMT  07/19/2017   IR RADIOLOGIST EVAL & MGMT  05/04/2018   LEFT HEART CATHETERIZATION WITH CORONARY ANGIOGRAM Bilateral 07/13/2012   Procedure: LEFT HEART CATHETERIZATION WITH CORONARY ANGIOGRAM;  Surgeon: Peter M Swaziland, MD;  Location: [facility] CATH LAB;  Service: Cardiovascular;  Laterality: Bilateral;   OOPHORECTOMY     PERMANENT PACEMAKER INSERTION N/A 02/02/2013   Procedure: PERMANENT PACEMAKER INSERTION;  Surgeon: Marinus Maw, MD;  Location: [facility] CATH LAB;  Service: Cardiovascular;  Laterality: N/A;   RADIOLOGY WITH ANESTHESIA Left 03/28/2017   Procedure: RENAL CRYO ABLATION;  Surgeon: Irish Lack, MD;  Location: [facility] ORS;  Service: Radiology;  Laterality: Left;   right distal pretibeal     melanoma   TOTAL HIP ARTHROPLASTY Right 09/04/2020   Procedure: ANTERIOR TOTAL HIP ARTHROPLASTY;  Surgeon: Samson Frederic, MD;  Location: [facility] ORS;  Service: Orthopedics;  Laterality: Right;     Initial Focused Assessment (If applicable, or please see trauma documentation): Airway: intact, patent Breathing: Lung sounds clear, equal bilaterally Circulation: No external signs of hemorrhage, pulses intact centrally and peripherally. Cap refill WDL.  20G PIV to L hand Disability: Baseline dementia but per SNF, slightly less "chipper" than normal.  PERRLA.  CT's Completed:   CT Head, CT Maxillofacial, and CT C-Spine   Interventions:  Labs drawn CXR Pelvic XR CT head, c-spine and max face.  Warm blankets applied  Plan for disposition:  Discharge home back to SNF.   Consults completed:  none at 1500.  Event Summary: Pt is from [facility] and was walking with staff when she lost her balance, resulting in her falling and striking her face.  Pt is on eliquis for a-fib.  Pt has hx of dementia but is not quite her normal self.  Pt has no complaints at this time other than being cold.   Bedside handoff with ED RN Alana.    Janora Norlander  Trauma Response RN  Please call TRN at [phone] for further assistance.

## 2023-04-08 NOTE — ED Triage Notes (Signed)
Pt arrived via [place] [facility] SNF for a witnessed mechanical fall. Pt was walking with a aide and she fell forward and hit her face off of the tile. Per staff to EMS, pt hwas "dazed" at first and has been lethargic and "not her normal chipper self since." Pt is A&Ox1 to self only

## 2023-04-08 NOTE — ED Provider Notes (Signed)
  Physical Exam  BP (!) 146/87   Pulse 74   Temp (!) 97.5 F (36.4 C) (Axillary)   Resp 17   Ht 5\' 8"  (1.727 m)   Wt 65.8 kg   LMP  (LMP Unknown)   SpO2 100%   BMI 22.06 kg/m   Physical Exam  Procedures  Procedures  ED Course / MDM   Clinical Course as of 04/08/23 1601  Fri Apr 08, 2023  1552 Assumed care from Dr [name] [name]. 89 yo F hx of dementia walking with aids who fell. Hit head but no serious external signs of trauma. On eliquis for AF. Unsure last dose. Awaiting CT results and depakote level at this time.  [RP]    Clinical Course User Index [RP] Rondel Baton, MD   Medical Decision Making Amount and/or Complexity of Data Reviewed Labs: ordered. Radiology: ordered.   Depakote level was not elevated.  Had an x-ray of her hand, chest, and pelvis as well as CT of the head and C-spine which did not show acute injuries.  Labs showed that she has a mild thrombocytopenia of 114 which appears to be chronic for her.  Will discharge her back to her facility at this time.       Rondel Baton, MD 04/09/23 1044

## 2023-04-08 NOTE — ED Notes (Signed)
Patient transported to CT by TRN ?

## 2023-04-29 ENCOUNTER — Ambulatory Visit: Payer: Medicare Other

## 2023-04-29 DIAGNOSIS — I255 Ischemic cardiomyopathy: Secondary | ICD-10-CM

## 2023-04-29 LAB — CUP PACEART REMOTE DEVICE CHECK
Battery Remaining Longevity: 78 mo
Battery Remaining Percentage: 85 %
Battery Voltage: 2.99 V
Date Time Interrogation Session: 20250117020008
Implantable Lead Connection Status: 753985
Implantable Lead Connection Status: 753985
Implantable Lead Connection Status: 753985
Implantable Lead Implant Date: 20141024
Implantable Lead Implant Date: 20141024
Implantable Lead Implant Date: 20141024
Implantable Lead Location: 753858
Implantable Lead Location: 753859
Implantable Lead Location: 753860
Implantable Pulse Generator Implant Date: 20230719
Lead Channel Impedance Value: 330 Ohm
Lead Channel Impedance Value: 690 Ohm
Lead Channel Pacing Threshold Amplitude: 1 V
Lead Channel Pacing Threshold Amplitude: 1 V
Lead Channel Pacing Threshold Pulse Width: 0.4 ms
Lead Channel Pacing Threshold Pulse Width: 0.5 ms
Lead Channel Sensing Intrinsic Amplitude: 7.7 mV
Lead Channel Setting Pacing Amplitude: 2 V
Lead Channel Setting Pacing Amplitude: 2.5 V
Lead Channel Setting Pacing Pulse Width: 0.4 ms
Lead Channel Setting Pacing Pulse Width: 0.5 ms
Lead Channel Setting Sensing Sensitivity: 5 mV
Pulse Gen Model: 3562
Pulse Gen Serial Number: 8099635

## 2023-05-26 ENCOUNTER — Telehealth: Payer: Self-pay

## 2023-05-26 NOTE — Telephone Encounter (Signed)
Error

## 2023-06-03 NOTE — Progress Notes (Signed)
 Remote pacemaker transmission.

## 2023-06-08 ENCOUNTER — Encounter: Payer: Self-pay | Admitting: Internal Medicine

## 2023-07-07 ENCOUNTER — Telehealth: Payer: Self-pay | Admitting: Emergency Medicine

## 2023-07-07 NOTE — Telephone Encounter (Signed)
 Spoke w/ patients daughter [name], in regards to scheduling patient for an appointment with [facility]EP APP for overdue yearly follow up. [name] states patient is in a memory care facility and it is hard to get patient to her appointments. She would like to know her other options.

## 2023-07-07 NOTE — Telephone Encounter (Signed)
 I will let [name] [name] weigh in on this.   We are UTD on remotes and most recent one was 07/04/23.

## 2023-07-11 NOTE — Telephone Encounter (Signed)
 Spoke with pt's daughter [name] [name] and asked if it would be possible for she and pt to follow up via phone call.  Pt's daughter states she would only be able to be with pt on a Thursday afternoon.  Advised will make Dr Graciela Husbands aware to see what his recommendation is at this time.  Pt's daughter verbalizes understanding and agrees with current plan.

## 2023-07-13 NOTE — Telephone Encounter (Signed)
 Left message with daughter to call me directly

## 2023-07-15 NOTE — Telephone Encounter (Signed)
 I canceled all upcoming remotes and marked her inactive in [employer]. I also put a note in [employer].

## 2023-07-15 NOTE — Telephone Encounter (Signed)
 Per Dr Graciela Husbands, after discussion with pt's daughter it has been decided that PPM monitoring will be discontinued.

## 2023-07-27 ENCOUNTER — Emergency Department (HOSPITAL_COMMUNITY)

## 2023-07-27 ENCOUNTER — Other Ambulatory Visit: Payer: Self-pay

## 2023-07-27 ENCOUNTER — Emergency Department (HOSPITAL_COMMUNITY)
Admission: EM | Admit: 2023-07-27 | Discharge: 2023-07-28 | Disposition: A | Discharge status: Other Acute Inpatient Hospital | Attending: Emergency Medicine | Admitting: Emergency Medicine

## 2023-07-27 ENCOUNTER — Encounter (HOSPITAL_COMMUNITY): Payer: Self-pay | Admitting: *Deleted

## 2023-07-27 DIAGNOSIS — W01198A Fall on same level from slipping, tripping and stumbling with subsequent striking against other object, initial encounter: Secondary | ICD-10-CM | POA: Insufficient documentation

## 2023-07-27 DIAGNOSIS — J449 Chronic obstructive pulmonary disease, unspecified: Secondary | ICD-10-CM | POA: Diagnosis not present

## 2023-07-27 DIAGNOSIS — F0390 Unspecified dementia without behavioral disturbance: Secondary | ICD-10-CM | POA: Diagnosis not present

## 2023-07-27 DIAGNOSIS — Z7901 Long term (current) use of anticoagulants: Secondary | ICD-10-CM | POA: Insufficient documentation

## 2023-07-27 DIAGNOSIS — S0990XA Unspecified injury of head, initial encounter: Secondary | ICD-10-CM | POA: Diagnosis present

## 2023-07-27 DIAGNOSIS — I2510 Atherosclerotic heart disease of native coronary artery without angina pectoris: Secondary | ICD-10-CM | POA: Insufficient documentation

## 2023-07-27 DIAGNOSIS — I10 Essential (primary) hypertension: Secondary | ICD-10-CM | POA: Insufficient documentation

## 2023-07-27 DIAGNOSIS — Z85820 Personal history of malignant melanoma of skin: Secondary | ICD-10-CM | POA: Insufficient documentation

## 2023-07-27 DIAGNOSIS — S0003XA Contusion of scalp, initial encounter: Secondary | ICD-10-CM | POA: Diagnosis not present

## 2023-07-27 NOTE — ED Provider Notes (Signed)
 [place] EMERGENCY DEPARTMENT AT [facility] Provider Note   CSN: [id number] Arrival date & time: 07/27/23  1943     History  No chief complaint on file.   [name] [name] is a 89 y.o. female with PMH as listed below who presents with Level 2 trauma, FoT. Presents BIBEMS from [place] [facility]. H/o dementia. Fell backward in her recliner, witnessed fall, hit the back of her head, did not lose consciousness. Otherwise was in her NSOH. Patient unable to answer questions d/t dementia, answers nonsensically, level 5 caveat.    Past Medical History:  Diagnosis Date   Acute blood loss as cause of postoperative anemia 03/29/2017   Atrial fibrillation (HCC) 10/06/2007   post op   Closed fracture of right femur, unspecified fracture morphology, initial encounter (HCC) 09/02/2020   Closed hip fracture, right, initial encounter (HCC) 09/01/2020   COPD (chronic obstructive pulmonary disease) (HCC)    Emphysema on 02/03/13 CXR   CORONARY ARTERY DISEASE 10/06/2007   a. s/p PCI to LAD; b. s/p CABG; c. LHC 07/13/12: Proximal LAD stent patent, LIMA-LAD atretic, D1 occluded, proximal circumflex 30%, mid RCA occluded, SVG-D1 normal, SVG-OM2 normal, SVG-distal RCA normal, EF 40% with diffuse HK   Dizziness    Fall at nursing home, initial encounter 09/21/2021   Fracture of femoral neck, right (HCC) 09/01/2020   HCAP (healthcare-associated pneumonia) 04/04/2017   History of colonic polyps 10/30/2009   No polyps in 2011. No repeat.     HYPERLIPIDEMIA 10/06/2007   HYPERTENSION 10/06/2007   LBBB 09/06/2008   Left renal mass    MELANOMA 09/06/2008   MOES PROCEDURE RIGHT   Nausea & vomiting 09/27/2021   NICM (nonischemic cardiomyopathy) (HCC)    Echocardiogram 07/12/12: EF 25-30%, diffuse HK, mild AI, mild MR, mild LAE   PERSONAL HX COLONIC POLYPS 10/30/2009   Presence of permanent cardiac pacemaker    Syncope    Traumatic perinephric hematoma of left kidney 03/29/2017   UTERINE CANCER, HX OF  09/06/2008   VARICOSE VEIN 09/29/2009       Home Medications Prior to Admission medications   Medication Sig Start Date End Date Taking? Authorizing Provider  acetaminophen  (TYLENOL ) 500 MG tablet Take 1,000 mg by mouth in the morning, at noon, and at bedtime. (0800, 2000)    [provider]  apixaban  (ELIQUIS ) 2.5 MG TABS tablet Take 1 tablet (2.5 mg total) by mouth 2 (two) times daily. Patient taking differently: Take 2.5 mg by mouth 2 (two) times daily. (0800, 2000) 09/29/21 04/08/23  Etter Hermann., MD  Cholecalciferol (VITAMIN D3 SUPER STRENGTH) 50 MCG (2000 UT) TABS Take 2,000 Units by mouth in the morning. (0800)    [provider]  divalproex (DEPAKOTE SPRINKLE) 125 MG capsule Take 125 mg by mouth 2 (two) times daily. (0800, 2000)    [provider]  escitalopram  (LEXAPRO ) 10 MG tablet Take 1 tablet (10 mg total) by mouth daily. Patient taking differently: Take 10 mg by mouth in the morning. (0800) 09/29/21 04/08/23  Etter Hermann., MD  Magnesium  Oxide -Mg Supplement (MAG-OXIDE) 200 MG TABS Take 1 tablet (200 mg total) by mouth daily. (2000) Patient taking differently: Take 200 mg by mouth every other day. (2000) 10/23/21   Ursuy, Renee Lynn, PA-C  megestrol (MEGACE) 40 MG tablet Take 40 mg by mouth 2 (two) times daily.    [provider]  Melatonin 10 MG TABS Take 10 mg by mouth at bedtime. (2000)    [provider]  memantine  (NAMENDA ) 5 MG tablet Take 5 mg by mouth in the morning and at bedtime. (0800, 2000) 08/22/20   [provider]  metoprolol  succinate (TOPROL -XL) 25 MG 24 hr tablet Take 1 tablet (25 mg total) by mouth daily. Patient taking differently: Take 25 mg by mouth in the morning. (0800) 09/29/21 04/08/23  Etter Hermann., MD  OLANZapine  (ZYPREXA ) 2.5 MG tablet Take 1 tablet (2.5 mg total) by mouth at bedtime. Patient taking differently: Take 2.5 mg by mouth 2 (two) times daily. (0800, 2000) 09/29/21  04/08/23  Etter Hermann., MD  pyridostigmine  (MESTINON ) 60 MG tablet TAKE 1 TABLET(60 MG) BY MOUTH DAILY Patient taking differently: Take 60 mg by mouth in the morning. (0800) 08/06/19   Almira Jaeger, MD  spironolactone  (ALDACTONE ) 25 MG tablet Take 0.5 tablets (12.5 mg total) by mouth daily. Patient not taking: Reported on 04/08/2023 09/29/21 06/19/22  Etter Hermann., MD      Allergies    Aricept  [donepezil ] and Nitrostat  [nitroglycerin ]    Review of Systems   Review of Systems A 10 point review of systems was performed and is negative unless otherwise reported in HPI.  Physical Exam Updated Vital Signs BP 132/65   Pulse 72   Temp 98.7 F (37.1 C)   Resp 18   Ht 5\' 8"  (1.727 m)   Wt 65.8 kg   LMP  (LMP Unknown)   SpO2 100%   BMI 22.06 kg/m  Physical Exam  PRIMARY SURVEY  Airway Airway intact  Breathing Bilateral breath sounds  Circulation Carotid/femoral pulses 2+ intact bilaterally  GCS E =  4 V =  4 M =  5 Total = 13 (baseline)  Environment All clothes removed      SECONDARY SURVEY  Gen: -NAD  HEENT: -Head: Small hematoma with mild TTP to posterior scalp. Scalp is clear of lacerations or wounds. Skull is clear of deformities or depressions -Forehead: Normal -Midface: Stable -Eyes: No visible injury to eyelids or eye, PERRL, EOMI -Nose: No gross deformities -Mouth: No injuries to lips, tongue or teeth. No trismus or malposition -Ears: No auricular hematoma -Neck: Trachea is midline, no distended neck veins  Chest: -No tenderness, deformities, bruising or crepitus to clavicles or chest -Normal chest expansion -Normal heart sounds, S1/S2 normal, no m/r/g -No wheezes, rales, rhonchi  Abdomen: -No tenderness, bruising or penetrating injury  Pelvis: -Pelvis is stable and non-tender  Extremities: Right Upper Extremity: -No point tenderness, deformity or other signs of injury -Radial pulse intact RUE, cap refill good -Normal sensation -Normal ROM,  good strength Left Upper Extremity: -No point tenderness, deformity or other signs of injury -Radial pulse intact LUE, cap refill good -Normal sensation -Normal ROM, good strength Right Lower Extremity: -No point tenderness, deformity or other signs of injury -DP intact RLE -Normal sensation -Normal ROM, good strength Left Lower Extremity: -No point tenderness, deformity or other signs of injury -DP intact LLE -Normal sensation -Normal ROM, good strength  Back/Spine: -No apparent midline C, T or Ls pine tenderness or step-offs  Other: N/A      ED Results / Procedures / Treatments   Labs (all labs ordered are listed, but only abnormal results are displayed) Labs Reviewed  URINALYSIS, ROUTINE W REFLEX MICROSCOPIC - Abnormal; Notable for the following components:      Result Value   Color, Urine AMBER (*)    APPearance CLOUDY (*)    Ketones, ur 5 (*)    Protein,  ur 30 (*)    Bacteria, UA MANY (*)    All other components within normal limits    EKG None  Radiology No results found.  Procedures Procedures    Medications Ordered in ED Medications - No data to display  ED Course/ Medical Decision Making/ A&P                          Medical Decision Making Amount and/or Complexity of Data Reviewed Labs: ordered. Radiology: ordered. Decision-making details documented in ED Course.    This patient presents to the ED for concern of fall w/ head trauma, this involves an extensive number of treatment options, and is a complaint that carries with it a high risk of complications and morbidity.  I considered the following differential and admission for this acute, potentially life threatening condition. Pt overall HDS and well-appearing  MDM:    DDX for trauma includes but is not limited to:  -Head Injury such as skull fx or ICH -Spinal Cord or Vertebral injury -Fractures - e/o extremity fractures or deformity -Patient is reportedly at her baseline mental status  per facility by EMS on scene   Clinical Course as of 08/03/23 0758  Wed Jul 27, 2023  2246 CT Head Wo Contrast 1. No evidence of acute abnormality intracranially or in the cervical spine. 2. Sclerotic T4 vertebral body lesion could represent a benign bone island or sclerotic metastasis.   [HN]  2247 DG Pelvis Portable Negative for acute traumatic injury. [HN]  2247 DG Chest Portable 1 View No acute cardiopulmonary process. [HN]    Clinical Course User Index [HN] Merdis Stalling, MD    Imaging Studies ordered: I ordered imaging studies including CXR, PXR, Cth, CT C-spine I independently visualized and interpreted imaging. I agree with the radiologist interpretation  Additional history obtained from EMS, chart review.    Social Determinants of Health: Lives at facility  Disposition:  Patient is signed out to the oncoming ED physician Dr. Carol Chroman who is made aware of her history, presentation, exam, workup, and plan. Plan is to obtain remainder of scans and likely DC back to facility.   Co morbidities that complicate the patient evaluation  Past Medical History:  Diagnosis Date   Acute blood loss as cause of postoperative anemia 03/29/2017   Atrial fibrillation (HCC) 10/06/2007   post op   Closed fracture of right femur, unspecified fracture morphology, initial encounter (HCC) 09/02/2020   Closed hip fracture, right, initial encounter (HCC) 09/01/2020   COPD (chronic obstructive pulmonary disease) (HCC)    Emphysema on 02/03/13 CXR   CORONARY ARTERY DISEASE 10/06/2007   a. s/p PCI to LAD; b. s/p CABG; c. LHC 07/13/12: Proximal LAD stent patent, LIMA-LAD atretic, D1 occluded, proximal circumflex 30%, mid RCA occluded, SVG-D1 normal, SVG-OM2 normal, SVG-distal RCA normal, EF 40% with diffuse HK   Dizziness    Fall at nursing home, initial encounter 09/21/2021   Fracture of femoral neck, right (HCC) 09/01/2020   HCAP (healthcare-associated pneumonia) 04/04/2017   History of  colonic polyps 10/30/2009   No polyps in 2011. No repeat.     HYPERLIPIDEMIA 10/06/2007   HYPERTENSION 10/06/2007   LBBB 09/06/2008   Left renal mass    MELANOMA 09/06/2008   MOES PROCEDURE RIGHT   Nausea & vomiting 09/27/2021   NICM (nonischemic cardiomyopathy) (HCC)    Echocardiogram 07/12/12: EF 25-30%, diffuse HK, mild AI, mild MR, mild LAE   PERSONAL HX COLONIC  POLYPS 10/30/2009   Presence of permanent cardiac pacemaker    Syncope    Traumatic perinephric hematoma of left kidney 03/29/2017   UTERINE CANCER, HX OF 09/06/2008   VARICOSE VEIN 09/29/2009     Medicines No orders of the defined types were placed in this encounter.   I have reviewed the patients home medicines and have made adjustments as needed  Problem List / ED Course: Problem List Items Addressed This Visit   None Visit Diagnoses       Contusion of scalp, initial encounter    -  Primary                   This note was created using dictation software, which may contain spelling or grammatical errors.    Merdis Stalling, MD 08/03/23 0800

## 2023-07-27 NOTE — ED Notes (Signed)
 Initial bp 98/48

## 2023-07-27 NOTE — ED Triage Notes (Signed)
 The pt arrived by ems from [facility]  she was sitting in her reclioner and slipped into the floor striking her head against the floor  she has 2 knots in the back of her head  she is on eliquis  confused and disoriented talking to people that are not there

## 2023-07-27 NOTE — ED Notes (Signed)
 Ems reports that the pt has  2 lumps on the back of her head and she was telling them that her lower back was painful  however she has no complaints at  the present time she answers no questions asked alert skin warm and dry  she is talking to no in the room  she is talking but there is no person there

## 2023-07-28 LAB — URINALYSIS, ROUTINE W REFLEX MICROSCOPIC
Bilirubin Urine: NEGATIVE
Glucose, UA: NEGATIVE mg/dL
Hgb urine dipstick: NEGATIVE
Ketones, ur: 5 mg/dL — AB
Leukocytes,Ua: NEGATIVE
Nitrite: NEGATIVE
Protein, ur: 30 mg/dL — AB
Specific Gravity, Urine: 1.015 (ref 1.005–1.030)
pH: 8 (ref 5.0–8.0)

## 2023-07-29 ENCOUNTER — Ambulatory Visit: Payer: Medicare Other

## 2023-10-28 ENCOUNTER — Ambulatory Visit: Payer: Medicare Other

## 2024-01-27 ENCOUNTER — Ambulatory Visit: Payer: Medicare Other

## 2024-03-23 ENCOUNTER — Emergency Department (HOSPITAL_COMMUNITY)

## 2024-03-23 ENCOUNTER — Emergency Department (HOSPITAL_COMMUNITY)
Admission: EM | Admit: 2024-03-23 | Discharge: 2024-03-23 | Disposition: A | Discharge status: Home / Self Care | Attending: Emergency Medicine | Admitting: Emergency Medicine

## 2024-03-23 ENCOUNTER — Other Ambulatory Visit: Payer: Self-pay

## 2024-03-23 DIAGNOSIS — S299XXA Unspecified injury of thorax, initial encounter: Secondary | ICD-10-CM | POA: Insufficient documentation

## 2024-03-23 DIAGNOSIS — W050XXA Fall from non-moving wheelchair, initial encounter: Secondary | ICD-10-CM | POA: Insufficient documentation

## 2024-03-23 DIAGNOSIS — I110 Hypertensive heart disease with heart failure: Secondary | ICD-10-CM | POA: Insufficient documentation

## 2024-03-23 DIAGNOSIS — Z7901 Long term (current) use of anticoagulants: Secondary | ICD-10-CM | POA: Insufficient documentation

## 2024-03-23 DIAGNOSIS — S7001XA Contusion of right hip, initial encounter: Secondary | ICD-10-CM | POA: Insufficient documentation

## 2024-03-23 DIAGNOSIS — S0990XA Unspecified injury of head, initial encounter: Secondary | ICD-10-CM

## 2024-03-23 DIAGNOSIS — S0101XA Laceration without foreign body of scalp, initial encounter: Secondary | ICD-10-CM | POA: Insufficient documentation

## 2024-03-23 DIAGNOSIS — I2510 Atherosclerotic heart disease of native coronary artery without angina pectoris: Secondary | ICD-10-CM | POA: Insufficient documentation

## 2024-03-23 DIAGNOSIS — W19XXXA Unspecified fall, initial encounter: Secondary | ICD-10-CM

## 2024-03-23 DIAGNOSIS — M79601 Pain in right arm: Secondary | ICD-10-CM

## 2024-03-23 DIAGNOSIS — Z79899 Other long term (current) drug therapy: Secondary | ICD-10-CM | POA: Insufficient documentation

## 2024-03-23 DIAGNOSIS — S3991XA Unspecified injury of abdomen, initial encounter: Secondary | ICD-10-CM | POA: Insufficient documentation

## 2024-03-23 DIAGNOSIS — I509 Heart failure, unspecified: Secondary | ICD-10-CM | POA: Insufficient documentation

## 2024-03-23 DIAGNOSIS — Z950 Presence of cardiac pacemaker: Secondary | ICD-10-CM | POA: Insufficient documentation

## 2024-03-23 DIAGNOSIS — F0390 Unspecified dementia without behavioral disturbance: Secondary | ICD-10-CM | POA: Insufficient documentation

## 2024-03-23 DIAGNOSIS — J449 Chronic obstructive pulmonary disease, unspecified: Secondary | ICD-10-CM | POA: Insufficient documentation

## 2024-03-23 DIAGNOSIS — S5001XA Contusion of right elbow, initial encounter: Secondary | ICD-10-CM | POA: Insufficient documentation

## 2024-03-23 LAB — BASIC METABOLIC PANEL WITH GFR
Anion gap: 13 (ref 5–15)
BUN: 29 mg/dL — ABNORMAL HIGH (ref 8–23)
CO2: 26 mmol/L (ref 22–32)
Calcium: 9 mg/dL (ref 8.9–10.3)
Chloride: 106 mmol/L (ref 98–111)
Creatinine, Ser: 0.79 mg/dL (ref 0.44–1.00)
GFR, Estimated: >60 mL/min (ref >60)
Glucose, Bld: 97 mg/dL (ref 70–99)
Potassium: 3.9 mmol/L (ref 3.5–5.1)
Sodium: 145 mmol/L (ref 135–145)

## 2024-03-23 LAB — CBC
HCT: 33.8 % — ABNORMAL LOW (ref 36.0–46.0)
Hemoglobin: 10.5 g/dL — ABNORMAL LOW (ref 12.0–15.0)
MCH: 31.1 pg (ref 26.0–34.0)
MCHC: 31.1 g/dL (ref 30.0–36.0)
MCV: 100 fL (ref 80.0–100.0)
Platelets: 87 K/uL — ABNORMAL LOW (ref 150–400)
RBC: 3.38 MIL/uL — ABNORMAL LOW (ref 3.87–5.11)
RDW: 16 % — ABNORMAL HIGH (ref 11.5–15.5)
WBC: 5.7 K/uL (ref 4.0–10.5)
nRBC: 0 % (ref 0.0–0.2)

## 2024-03-23 LAB — URINALYSIS, ROUTINE W REFLEX MICROSCOPIC
Bilirubin Urine: NEGATIVE
Glucose, UA: NEGATIVE mg/dL
Hgb urine dipstick: NEGATIVE
Ketones, ur: NEGATIVE mg/dL
Leukocytes,Ua: NEGATIVE
Nitrite: NEGATIVE
Protein, ur: NEGATIVE mg/dL
Specific Gravity, Urine: 1.02 (ref 1.005–1.030)
pH: 5 (ref 5.0–8.0)

## 2024-03-23 LAB — APTT: aPTT: 34 s (ref 24–36)

## 2024-03-23 LAB — PROTIME-INR
INR: 1.9 — ABNORMAL HIGH (ref 0.8–1.2)
Prothrombin Time: 22.5 s — ABNORMAL HIGH (ref 11.4–15.2)

## 2024-03-23 LAB — TSH: TSH: 3.337 u[IU]/mL (ref 0.350–4.500)

## 2024-03-23 MED ORDER — IOHEXOL 350 MG/ML SOLN
60.0000 mL | Freq: Once | INTRAVENOUS | Status: AC | PRN
  Administered 2024-03-23: 60 mL via INTRAVENOUS

## 2024-03-23 MED ORDER — ONDANSETRON HCL 4 MG/2ML IJ SOLN: 4.0000 mg | Freq: Once | INTRAMUSCULAR | Status: DC | PRN

## 2024-03-23 NOTE — Discharge Instructions (Signed)
 Thank for letting us  evaluate you today.  Your CT imaging did not show any traumatic injury.  Your labs are without acute abnormalities.  Return to Emergency Department if you experience altered mentation, loss consciousness, worsening symptoms

## 2024-03-23 NOTE — ED Notes (Signed)
 Ptar called pt is next after all the trucks clear

## 2024-03-23 NOTE — ED Notes (Signed)
 Got patient on there monitor patient is resting with call bell in reach got patient a warm blanket

## 2024-03-23 NOTE — ED Provider Notes (Signed)
 [place] EMERGENCY DEPARTMENT AT [facility] Provider Note   CSN: [id number] Arrival date & time: 03/23/24  1131     Patient presents with: Fall on thinners   Byrd Terrero is a 89 y.o. female with past medical history significant for heart failure, hypertension, A-fib, pacemaker, who is a DNR, history of dementia, CAD, AV block, COPD who presents after fall on thinners.  Patient was in her wheelchair, fell to the side and struck the right side of her head, elbow, shoulder and hip.  Slightly more altered from her baseline per staff.   HPI     Prior to Admission medications  Medication Sig Start Date End Date Taking? Authorizing Provider  acetaminophen  (TYLENOL ) 500 MG tablet Take 1,000 mg by mouth in the morning, at noon, and at bedtime. (0800, 2000)    [provider]  apixaban  (ELIQUIS ) 2.5 MG TABS tablet Take 1 tablet (2.5 mg total) by mouth 2 (two) times daily. Patient taking differently: Take 2.5 mg by mouth 2 (two) times daily. (0800, 2000) 09/29/21 04/08/23  Perri DELENA Meliton Mickey., MD  Cholecalciferol (VITAMIN D3 SUPER STRENGTH) 50 MCG (2000 UT) TABS Take 2,000 Units by mouth in the morning. (0800)    [provider]  divalproex (DEPAKOTE SPRINKLE) 125 MG capsule Take 125 mg by mouth 2 (two) times daily. (0800, 2000)    [provider]  escitalopram  (LEXAPRO ) 10 MG tablet Take 1 tablet (10 mg total) by mouth daily. Patient taking differently: Take 10 mg by mouth in the morning. (0800) 09/29/21 04/08/23  Perri DELENA Meliton Mickey., MD  Magnesium  Oxide -Mg Supplement (MAG-OXIDE) 200 MG TABS Take 1 tablet (200 mg total) by mouth daily. (2000) Patient taking differently: Take 200 mg by mouth every other day. (2000) 10/23/21   Ursuy, Renee Lynn, PA-C  megestrol (MEGACE) 40 MG tablet Take 40 mg by mouth 2 (two) times daily.    [provider]  Melatonin 10 MG TABS Take 10 mg by mouth at bedtime. (2000)    [provider]  memantine   (NAMENDA ) 5 MG tablet Take 5 mg by mouth in the morning and at bedtime. (0800, 2000) 08/22/20   [provider]  metoprolol  succinate (TOPROL -XL) 25 MG 24 hr tablet Take 1 tablet (25 mg total) by mouth daily. Patient taking differently: Take 25 mg by mouth in the morning. (0800) 09/29/21 04/08/23  Perri DELENA Meliton Mickey., MD  OLANZapine  (ZYPREXA ) 2.5 MG tablet Take 1 tablet (2.5 mg total) by mouth at bedtime. Patient taking differently: Take 2.5 mg by mouth 2 (two) times daily. (0800, 2000) 09/29/21 04/08/23  Perri DELENA Meliton Mickey., MD  pyridostigmine  (MESTINON ) 60 MG tablet TAKE 1 TABLET(60 MG) BY MOUTH DAILY Patient taking differently: Take 60 mg by mouth in the morning. (0800) 08/06/19   Katrinka Garnette KIDD, MD  spironolactone  (ALDACTONE ) 25 MG tablet Take 0.5 tablets (12.5 mg total) by mouth daily. Patient not taking: Reported on 04/08/2023 09/29/21 06/19/22  Perri DELENA Meliton Mickey., MD    Allergies: Aricept  [donepezil ] and Nitrostat  [nitroglycerin ]    Review of Systems  Reason unable to perform ROS: Level 5 Caveat: Dementia.    Updated Vital Signs BP (!) 138/108   Pulse 77   Temp (!) 97.1 F (36.2 C) (Oral)   Resp 18   LMP  (LMP Unknown)   SpO2 100%   Physical Exam Vitals and nursing note reviewed.  Constitutional:      General: She is not in acute distress.  Appearance: Normal appearance.  HENT:     Head: Normocephalic and atraumatic.  Eyes:     General:        Right eye: No discharge.        Left eye: No discharge.  Cardiovascular:     Rate and Rhythm: Normal rate and regular rhythm.     Heart sounds: No murmur heard.    No friction rub. No gallop.  Pulmonary:     Effort: Pulmonary effort is normal.     Breath sounds: Normal breath sounds.  Abdominal:     General: Bowel sounds are normal.     Palpations: Abdomen is soft.  Musculoskeletal:     Comments: No obvious step-off, deformity of throughout, but ttp of right shoulder, right elbow, right hip. No leg length  discrepancy or rotation appreciated  Skin:    General: Skin is warm and dry.     Capillary Refill: Capillary refill takes less than 2 seconds.     Comments: Some mild soft tissue swelling, bruising noted to right elbow, right hip. Large swollen hematoma on right frontal forehead  Neurological:     Mental Status: She is alert and oriented to person, place, and time.  Psychiatric:        Mood and Affect: Mood normal.        Behavior: Behavior normal.     (all labs ordered are listed, but only abnormal results are displayed) Labs Reviewed  CBC - Abnormal; Notable for the following components:      Result Value   RBC 3.38 (*)    Hemoglobin 10.5 (*)    HCT 33.8 (*)    RDW 16.0 (*)    Platelets 87 (*)    All other components within normal limits  PROTIME-INR - Abnormal; Notable for the following components:   Prothrombin Time 22.5 (*)    INR 1.9 (*)    All other components within normal limits  APTT  URINALYSIS, ROUTINE W REFLEX MICROSCOPIC  TSH  BASIC METABOLIC PANEL WITH GFR    EKG: EKG Interpretation Date/Time:  Friday March 23 2024 11:32:02 EST Ventricular Rate:  77 PR Interval:  73 QRS Duration:  155 QT Interval:  498 QTC Calculation: 571 R Axis:   266  Text Interpretation: Sinus rhythm Short PR interval Right atrial enlargement Right bundle branch block when compared to proir, more artifact and wandering baseline No STEMI Confirmed by Ginger Barefoot ([id number]) on 03/23/2024 11:36:13 AM  Radiology: CT Head Wo Contrast Result Date: 03/23/2024 EXAM: CT HEAD WITHOUT 03/23/2024 12:15:00 PM TECHNIQUE: CT of the head was performed without the administration of intravenous contrast. Automated exposure control, iterative reconstruction, and/or weight based adjustment of the mA/kV was utilized to reduce the radiation dose to as low as reasonably achievable. COMPARISON: CT head dated 07/27/2023 and MRI brain dated 07/26/2012. CLINICAL HISTORY: Head trauma, minor (Age >= 65y).  FINDINGS: BRAIN AND VENTRICLES: No acute intracranial hemorrhage. No mass effect or midline shift. No suspicious extra-axial fluid collection. No evidence of acute territorial infarct. No hydrocephalus. There is overall similar generalized parenchymal volume loss. There is overall similar scattered white matter hypodensities which are nonspecific but most commonly represent chronic microvascular ischemic changes. ORBITS: Orbits demonstrate bilateral lens replacement. SINUSES AND MASTOIDS: No acute abnormality. SOFT TISSUES AND SKULL: No acute skull fracture. Frontal scalp swelling and hematoma. Cerumen in the right external auditory canal. IMPRESSION: 1. No acute intracranial hemorrhage or calvarial fracture. 2. Frontal scalp swelling and hematoma. Electronically signed by: Prentice  Bybordi 03/23/2024 12:45 PM EST RP Workstation: [id number]   DG Chest Portable 1 View Result Date: 03/23/2024 EXAM: 1 VIEW(S) XRAY OF THE CHEST 03/23/2024 12:12:00 PM COMPARISON: 07/27/2023 CLINICAL HISTORY: 89 year old female with a history of falling forward out of a wheelchair while on anticoagulant medication. FINDINGS: LINES, TUBES AND DEVICES: Left implanted cardiac device stable in place. LUNGS AND PLEURA: No focal pulmonary opacity. No pleural effusion. No pneumothorax. HEART AND MEDIASTINUM: Post sternotomy changes. Cardiomegaly. Aortic atherosclerosis. BONES AND SOFT TISSUES: Chronic right rib fractures. Similar patient rotation to the right. IMPRESSION: 1. No acute cardiopulmonary abnormality. Electronically signed by: Helayne Hurst MD 03/23/2024 12:29 PM EST RP Workstation: [id number]   DG Shoulder Right Result Date: 03/23/2024 EXAM: 3 VIEW(S) XRAY OF THE RIGHT SHOULDER 03/23/2024 12:12:00 PM COMPARISON: Chest radiograph 07/27/2023. CLINICAL HISTORY: 89 year old female status post fall forward from wheelchair on blood thinners. FINDINGS: BONES AND JOINTS: Glenohumeral joint is normally aligned. No acute fracture or  dislocation identified about the right shoulder. No malalignment. The [facility] joint is unremarkable. SOFT TISSUES: Median sternotomy wires noted. Chronic appearing right posterior 7th and 8th rib fractures. No abnormal calcifications. Visualized lung is unremarkable. IMPRESSION: 1. No acute fracture or dislocation identified about the right shoulder. Electronically signed by: Helayne Hurst MD 03/23/2024 12:28 PM EST RP Workstation: [id number]   DG Pelvis Portable Result Date: 03/23/2024 EXAM: 1 or 2 VIEW(S) XRAY OF THE PELVIS 03/23/2024 12:12:00 PM COMPARISON: 07/27/2023 CLINICAL HISTORY: 89 year old female status post fall forward from wheelchair while on anticoagulants. FINDINGS: BONES AND JOINTS: Right hip hemiarthroplasty in place. Mild degenerative changes in left hip. No acute fracture. No malalignment. SOFT TISSUES: The soft tissues are unremarkable. IMPRESSION: 1. No acute fracture or dislocation identified about the pelvis. Electronically signed by: Helayne Hurst MD 03/23/2024 12:26 PM EST RP Workstation: [id number]   DG Elbow Complete Right Result Date: 03/23/2024 EXAM: 3 VIEW(S) XRAY OF THE RIGHT ELBOW COMPARISON: None available. CLINICAL HISTORY: 89 year old female status post fall forward from wheelchair while on anticoagulant medication. FINDINGS: BONES AND JOINTS: No acute osseous abnormality. No evidence of joint effusion. SOFT TISSUES: The soft tissues are unremarkable. IMPRESSION: 1. No acute fracture or dislocation identified about the right elbow. Electronically signed by: Helayne Hurst MD 03/23/2024 12:25 PM EST RP Workstation: [id number]   CT Cervical Spine Wo Contrast Result Date: 03/23/2024 EXAM: CT CERVICAL SPINE WITHOUT CONTRAST 03/23/2024 12:15:00 PM TECHNIQUE: CT of the cervical spine was performed without the administration of intravenous contrast. Multiplanar reformatted images are provided for review. Automated exposure control, iterative reconstruction, and/or weight based adjustment  of the mA/kV was utilized to reduce the radiation dose to as low as reasonably achievable. COMPARISON: Cervical spine CT 07/27/2023. CLINICAL HISTORY: 89 year old female. Status post fall forward out of wheelchair. FINDINGS: CERVICAL SPINE: BONES AND ALIGNMENT: Stable chronic reversal of the normal cervical lordosis. Chronic degenerative cervical spinal ankylosis from C2 through C5. Chronic generalized osteopenia. No acute fracture or traumatic malalignment. DEGENERATIVE CHANGES: Chronic degenerative cervical spinal ankylosis from C2 through C5. Chronic severe disc and endplate degeneration at C5-C6, probably with developing interbody ankylosis there (sagittal image 34). Chronic calcified disc degeneration at C6-C7. SOFT TISSUES: No prevertebral soft tissue swelling. Partially retropharyngeal course of the left carotid artery, normal variant. VASCULATURE: Calcified atherosclerosis at the visible skull base. LUNGS: Layering low density pleural fluid in the right lung apex is new. OTHER: Partially visible chronic left chest cardiac pacemaker leads. IMPRESSION: 1. No acute traumatic injury identified in the cervical spine. 2. Layering low-density  right apical pleural effusion is new since April. 3. Chronic degenerative subtotal cervical spinal ankylosis. Electronically signed by: Helayne Hurst MD 03/23/2024 12:24 PM EST RP Workstation: [id number]     .Laceration Repair  Date/Time: 03/23/2024 2:57 PM  Performed by: Rosan Sherlean DEL, PA-C Authorized by: Rosan Sherlean DEL, PA-C   Consent:    Consent obtained:  Verbal   Consent given by:  Patient   Risks, benefits, and alternatives were discussed: yes     Risks discussed:  Infection, pain, poor cosmetic result, tendon damage, vascular damage, poor wound healing, need for additional repair and nerve damage   Alternatives discussed:  No treatment Universal protocol:    Procedure explained and questions answered to patient or proxy's satisfaction: yes      Patient identity confirmed:  Verbally with patient Anesthesia:    Anesthesia method:  None Laceration details:    Location:  Scalp   Scalp location:  Frontal   Length (cm):  3   Depth (mm):  4 Treatment:    Area cleansed with:  Saline   Amount of cleaning:  Standard Skin repair:    Repair method:  Staples   Number of staples:  2 Approximation:    Approximation:  Close Repair type:    Repair type:  Simple Post-procedure details:    Dressing:  Non-adherent dressing   Procedure completion:  Tolerated    Medications Ordered in the ED  ondansetron  (ZOFRAN ) injection 4 mg (has no administration in time range)                                    Medical Decision Making Amount and/or Complexity of Data Reviewed Labs: ordered. Radiology: ordered.  Risk Prescription drug management.   This patient is a 89 y.o. female  who presents to the ED for concern of fall on thinners, ams.   Differential diagnoses prior to evaluation: The emergent differential diagnosis includes, but is not limited to,  epidural hematoma, subdural hematoma, skull fracture, subarachnoid hemorrhage, unstable cervical spine fracture, concussion vs other MSK injury, CVA, seizure, hypotension, sepsis, hypoglycemia, hypoxic encephalopathy, metabolic encephalopathy, polypharmacy, substance abuse, developing dementia or alzheimers, meningitis, encephalitis, hypertensive emergency, other systemic infection, acute alcohol intoxication, acute alcohol or other drug withdrawal or psychiatric manifestation vs other . This is not an exhaustive differential.   Past Medical History / Co-morbidities / Social History: heart failure, hypertension, A-fib, pacemaker, who is a DNR, history of dementia, CAD, AV block, COPD  -- on eliquis  for Afib  Additional history: Chart reviewed. Pertinent results include: Reviewed lab work, imaging from previous emergency room visits  Physical Exam: Physical exam performed. The  pertinent findings include: No obvious step-off, deformity of throughout, but ttp of right shoulder, right elbow, right hip. No leg length discrepancy or rotation appreciated   Some mild soft tissue swelling, bruising noted to right elbow, right hip. Large swollen hematoma on right frontal forehead   Vital signs stable other than diastolic hypertension, blood pressure 138/108  Lab Tests/Imaging studies: I personally interpreted labs/imaging and the pertinent results include: CT head, C-spine shows new layering right apical pleural effusion from previous, no evidence of acute injury and plain film radiographs of chest, shoulder, pelvis, elbow, given her new layering pleural effusion, trauma will obtain CT chest abdomen pelvis with contrast to assess for intra-abdominal injury, bleeding. CTAP pending at time of handoff.  INR mildly elevated at 1.9.  CBC notable  for mild anemia, hemoglobin 10.5, mild worsening thrombocytopenia completely to 87.  I agree with the radiologist interpretation.  Cardiac monitoring: EKG obtained and interpreted by myself and attending physician which shows: Normal sinus rhythm, right bundle branch block, wandering baseline compared to previous but no acute ST ST changes identified   3:02 PM Care of Juno Alers transferred to [facility] Tinnie Matter and Dr. Ellouise at the end of my shift as the patient will require reassessment once labs/imaging have resulted. Patient presentation, ED course, and plan of care discussed with review of all pertinent labs and imaging. Please see his/her note for further details regarding further ED course and disposition. Plan at time of handoff is pending CTAP, likely plan on discharge home unless major acute finding identified. This may be altered or completely changed at the discretion of the oncoming team pending results of further workup.  Final diagnoses:  None    ED Discharge Orders     None          Rosan Sherlean VEAR DEVONNA 03/23/24 1502    Tegeler, Lonni PARAS, MD 03/26/24 [phone]

## 2024-03-23 NOTE — ED Notes (Signed)
 Report called to Chayenne RN for transport to [facility]

## 2024-03-23 NOTE — ED Triage Notes (Signed)
 Patient was BIB [facility] from [facility]. Facility reported she had a unwitness fall, while she was sitting in her wheelchair drinking he orange juice she lean forward and fell out the wheelchair and hit her head. She  did scream as she was falling. Patient have hematoma fore head. Skintear right arm, bruise on right hip. The patient c/o right hip pain. EMS said they weren't sure if the bruise was old or new. Facility told EMS she has hx of dementia, that she normally oriented 1 to 2.

## 2024-03-23 NOTE — ED Provider Notes (Addendum)
 Received patient in signout from previous provider pending CT chest abdomen pelvis.  See their note.  In short, patient presents emergency department for evaluation of injury following a fall from wheelchair.  Staff also endorses slightly more altered mentation from her baseline.  Physical exam notable for mild soft tissue swelling, ecchymosis to right elbow, right hip and a large swollen hematoma on right frontal forehead requiring 2 staples.  Labs notable for Hgb of 10.5.  TSH WNL.  Electrolytes WNL.  UA wo infection  Chest abdomen pelvis negative for acute traumatic injury.  X-ray of shoulder, pelvis, elbow negative for fracture.  There are some chronic incidental findings.  CT individual report for specific impression.  Physical Exam  BP 117/75   Pulse 83   Temp 98.7 F (37.1 C) (Oral)   Resp 17   LMP  (LMP Unknown)   SpO2 100%   Physical Exam   ED Course / MDM    Medical Decision Making Amount and/or Complexity of Data Reviewed Labs: ordered. Radiology: ordered.  Risk Prescription drug management.   Family at bedside reports that patient is at baseline.  There is no traumatic injury on imaging.  There is no significant abnormalities found on lab work.  I discussed ED workup with family who expressed understanding agree with plan.  They are comfortable with plan to discharge patient back to facility.  Discussed ED workup, disposition, return to ED precautions with patient who expresses understanding agrees with plan.  All questions answered to their satisfaction.  They are agreeable to plan.  Discharge instructions provided on paperwork   Minnie Tinnie BRAVO, PA 03/23/24 1856    Minnie Tinnie BRAVO, PA 03/23/24 1856    Ellouise Fine K, DO 03/23/24 1950

## 2024-04-27 ENCOUNTER — Ambulatory Visit: Payer: Medicare Other
